# Patient Record
Sex: Female | Born: 1950
Health system: Southern US, Community
[De-identification: ages and names within clinical notes are randomized; demographics above are authoritative.]

## PROBLEM LIST (undated history)

## (undated) DIAGNOSIS — J45909 Unspecified asthma, uncomplicated: Secondary | ICD-10-CM

## (undated) DIAGNOSIS — R0600 Dyspnea, unspecified: Secondary | ICD-10-CM

## (undated) DIAGNOSIS — T8859XA Other complications of anesthesia, initial encounter: Secondary | ICD-10-CM

## (undated) DIAGNOSIS — Z803 Family history of malignant neoplasm of breast: Secondary | ICD-10-CM

## (undated) DIAGNOSIS — E039 Hypothyroidism, unspecified: Secondary | ICD-10-CM

## (undated) DIAGNOSIS — T4145XA Adverse effect of unspecified anesthetic, initial encounter: Secondary | ICD-10-CM

## (undated) DIAGNOSIS — Z9889 Other specified postprocedural states: Secondary | ICD-10-CM

## (undated) DIAGNOSIS — R112 Nausea with vomiting, unspecified: Secondary | ICD-10-CM

## (undated) DIAGNOSIS — I10 Essential (primary) hypertension: Secondary | ICD-10-CM

## (undated) HISTORY — PX: ABDOMINAL HYSTERECTOMY: SHX81

## (undated) HISTORY — DX: Family history of malignant neoplasm of breast: Z80.3

## (undated) HISTORY — PX: CHOLECYSTECTOMY: SHX55

## (undated) HISTORY — PX: DILATION AND CURETTAGE OF UTERUS: SHX78

---

## 2015-10-15 DIAGNOSIS — I10 Essential (primary) hypertension: Secondary | ICD-10-CM | POA: Diagnosis not present

## 2015-10-15 DIAGNOSIS — E039 Hypothyroidism, unspecified: Secondary | ICD-10-CM | POA: Diagnosis not present

## 2015-10-17 DIAGNOSIS — E039 Hypothyroidism, unspecified: Secondary | ICD-10-CM | POA: Diagnosis not present

## 2015-10-17 DIAGNOSIS — I10 Essential (primary) hypertension: Secondary | ICD-10-CM | POA: Diagnosis not present

## 2015-10-17 DIAGNOSIS — E789 Disorder of lipoprotein metabolism, unspecified: Secondary | ICD-10-CM | POA: Diagnosis not present

## 2015-11-15 DIAGNOSIS — I10 Essential (primary) hypertension: Secondary | ICD-10-CM | POA: Diagnosis not present

## 2015-11-15 DIAGNOSIS — G4700 Insomnia, unspecified: Secondary | ICD-10-CM | POA: Diagnosis not present

## 2015-11-15 DIAGNOSIS — E0590 Thyrotoxicosis, unspecified without thyrotoxic crisis or storm: Secondary | ICD-10-CM | POA: Diagnosis not present

## 2015-12-20 DIAGNOSIS — I10 Essential (primary) hypertension: Secondary | ICD-10-CM | POA: Diagnosis not present

## 2015-12-20 DIAGNOSIS — F419 Anxiety disorder, unspecified: Secondary | ICD-10-CM | POA: Diagnosis not present

## 2016-01-20 DIAGNOSIS — J329 Chronic sinusitis, unspecified: Secondary | ICD-10-CM | POA: Diagnosis not present

## 2016-02-05 DIAGNOSIS — I10 Essential (primary) hypertension: Secondary | ICD-10-CM | POA: Diagnosis not present

## 2016-02-05 DIAGNOSIS — R300 Dysuria: Secondary | ICD-10-CM | POA: Diagnosis not present

## 2016-02-05 DIAGNOSIS — G4700 Insomnia, unspecified: Secondary | ICD-10-CM | POA: Diagnosis not present

## 2016-02-05 DIAGNOSIS — F419 Anxiety disorder, unspecified: Secondary | ICD-10-CM | POA: Diagnosis not present

## 2016-02-05 DIAGNOSIS — E0590 Thyrotoxicosis, unspecified without thyrotoxic crisis or storm: Secondary | ICD-10-CM | POA: Diagnosis not present

## 2016-05-12 DIAGNOSIS — J019 Acute sinusitis, unspecified: Secondary | ICD-10-CM | POA: Diagnosis not present

## 2016-05-12 DIAGNOSIS — Z889 Allergy status to unspecified drugs, medicaments and biological substances status: Secondary | ICD-10-CM | POA: Diagnosis not present

## 2016-07-03 DIAGNOSIS — Z889 Allergy status to unspecified drugs, medicaments and biological substances status: Secondary | ICD-10-CM | POA: Diagnosis not present

## 2016-07-03 DIAGNOSIS — R0602 Shortness of breath: Secondary | ICD-10-CM | POA: Diagnosis not present

## 2016-07-03 DIAGNOSIS — Z8709 Personal history of other diseases of the respiratory system: Secondary | ICD-10-CM | POA: Diagnosis not present

## 2016-08-05 DIAGNOSIS — F419 Anxiety disorder, unspecified: Secondary | ICD-10-CM | POA: Diagnosis not present

## 2016-08-05 DIAGNOSIS — I10 Essential (primary) hypertension: Secondary | ICD-10-CM | POA: Diagnosis not present

## 2016-08-05 DIAGNOSIS — Z79899 Other long term (current) drug therapy: Secondary | ICD-10-CM | POA: Diagnosis not present

## 2016-08-05 DIAGNOSIS — E0590 Thyrotoxicosis, unspecified without thyrotoxic crisis or storm: Secondary | ICD-10-CM | POA: Diagnosis not present

## 2016-08-05 DIAGNOSIS — G4700 Insomnia, unspecified: Secondary | ICD-10-CM | POA: Diagnosis not present

## 2016-08-05 DIAGNOSIS — Z889 Allergy status to unspecified drugs, medicaments and biological substances status: Secondary | ICD-10-CM | POA: Diagnosis not present

## 2016-08-05 DIAGNOSIS — R109 Unspecified abdominal pain: Secondary | ICD-10-CM | POA: Diagnosis not present

## 2016-08-05 DIAGNOSIS — Z8709 Personal history of other diseases of the respiratory system: Secondary | ICD-10-CM | POA: Diagnosis not present

## 2016-08-14 DIAGNOSIS — R109 Unspecified abdominal pain: Secondary | ICD-10-CM | POA: Diagnosis not present

## 2016-08-14 DIAGNOSIS — Z9049 Acquired absence of other specified parts of digestive tract: Secondary | ICD-10-CM | POA: Diagnosis not present

## 2016-08-24 DIAGNOSIS — R109 Unspecified abdominal pain: Secondary | ICD-10-CM | POA: Diagnosis not present

## 2016-08-24 DIAGNOSIS — K5730 Diverticulosis of large intestine without perforation or abscess without bleeding: Secondary | ICD-10-CM | POA: Diagnosis not present

## 2016-08-24 DIAGNOSIS — R112 Nausea with vomiting, unspecified: Secondary | ICD-10-CM | POA: Diagnosis not present

## 2016-08-24 DIAGNOSIS — I712 Thoracic aortic aneurysm, without rupture: Secondary | ICD-10-CM | POA: Diagnosis not present

## 2017-01-07 DIAGNOSIS — R399 Unspecified symptoms and signs involving the genitourinary system: Secondary | ICD-10-CM | POA: Diagnosis not present

## 2017-01-07 DIAGNOSIS — N3289 Other specified disorders of bladder: Secondary | ICD-10-CM | POA: Diagnosis not present

## 2017-01-07 DIAGNOSIS — R301 Vesical tenesmus: Secondary | ICD-10-CM | POA: Diagnosis not present

## 2017-03-15 DIAGNOSIS — F439 Reaction to severe stress, unspecified: Secondary | ICD-10-CM | POA: Diagnosis not present

## 2017-03-15 DIAGNOSIS — I10 Essential (primary) hypertension: Secondary | ICD-10-CM | POA: Diagnosis not present

## 2017-03-15 DIAGNOSIS — Z719 Counseling, unspecified: Secondary | ICD-10-CM | POA: Diagnosis not present

## 2017-03-15 DIAGNOSIS — Z760 Encounter for issue of repeat prescription: Secondary | ICD-10-CM | POA: Diagnosis not present

## 2017-03-15 DIAGNOSIS — Z0000 Encounter for general adult medical examination without abnormal findings: Secondary | ICD-10-CM | POA: Diagnosis not present

## 2017-03-15 DIAGNOSIS — E039 Hypothyroidism, unspecified: Secondary | ICD-10-CM | POA: Diagnosis not present

## 2017-03-15 DIAGNOSIS — Z7189 Other specified counseling: Secondary | ICD-10-CM | POA: Diagnosis not present

## 2017-03-15 DIAGNOSIS — Z0189 Encounter for other specified special examinations: Secondary | ICD-10-CM | POA: Diagnosis not present

## 2017-05-03 DIAGNOSIS — I10 Essential (primary) hypertension: Secondary | ICD-10-CM | POA: Diagnosis not present

## 2017-05-03 DIAGNOSIS — J3089 Other allergic rhinitis: Secondary | ICD-10-CM | POA: Diagnosis not present

## 2017-05-03 DIAGNOSIS — I714 Abdominal aortic aneurysm, without rupture: Secondary | ICD-10-CM | POA: Diagnosis not present

## 2017-05-03 DIAGNOSIS — Z79899 Other long term (current) drug therapy: Secondary | ICD-10-CM | POA: Diagnosis not present

## 2017-05-03 DIAGNOSIS — E038 Other specified hypothyroidism: Secondary | ICD-10-CM | POA: Diagnosis not present

## 2017-05-03 DIAGNOSIS — E784 Other hyperlipidemia: Secondary | ICD-10-CM | POA: Diagnosis not present

## 2017-05-10 DIAGNOSIS — I714 Abdominal aortic aneurysm, without rupture: Secondary | ICD-10-CM | POA: Diagnosis not present

## 2017-05-12 DIAGNOSIS — N6031 Fibrosclerosis of right breast: Secondary | ICD-10-CM | POA: Diagnosis not present

## 2017-05-12 DIAGNOSIS — N6310 Unspecified lump in the right breast, unspecified quadrant: Secondary | ICD-10-CM | POA: Diagnosis not present

## 2017-05-12 DIAGNOSIS — N6311 Unspecified lump in the right breast, upper outer quadrant: Secondary | ICD-10-CM | POA: Diagnosis not present

## 2017-05-12 DIAGNOSIS — N6489 Other specified disorders of breast: Secondary | ICD-10-CM | POA: Diagnosis not present

## 2017-06-16 ENCOUNTER — Ambulatory Visit: Payer: Self-pay | Admitting: Physician Assistant

## 2017-08-09 DIAGNOSIS — E7849 Other hyperlipidemia: Secondary | ICD-10-CM | POA: Diagnosis not present

## 2017-08-09 DIAGNOSIS — R195 Other fecal abnormalities: Secondary | ICD-10-CM | POA: Diagnosis not present

## 2017-08-09 DIAGNOSIS — E038 Other specified hypothyroidism: Secondary | ICD-10-CM | POA: Diagnosis not present

## 2017-08-09 DIAGNOSIS — I10 Essential (primary) hypertension: Secondary | ICD-10-CM | POA: Diagnosis not present

## 2017-09-15 DIAGNOSIS — J029 Acute pharyngitis, unspecified: Secondary | ICD-10-CM | POA: Diagnosis not present

## 2017-09-15 DIAGNOSIS — H6641 Suppurative otitis media, unspecified, right ear: Secondary | ICD-10-CM | POA: Diagnosis not present

## 2017-11-07 DIAGNOSIS — I10 Essential (primary) hypertension: Secondary | ICD-10-CM | POA: Diagnosis not present

## 2017-11-07 DIAGNOSIS — R05 Cough: Secondary | ICD-10-CM | POA: Diagnosis not present

## 2017-11-07 DIAGNOSIS — J0110 Acute frontal sinusitis, unspecified: Secondary | ICD-10-CM | POA: Diagnosis not present

## 2017-11-15 DIAGNOSIS — R195 Other fecal abnormalities: Secondary | ICD-10-CM | POA: Diagnosis not present

## 2017-11-15 DIAGNOSIS — E038 Other specified hypothyroidism: Secondary | ICD-10-CM | POA: Diagnosis not present

## 2017-11-15 DIAGNOSIS — I10 Essential (primary) hypertension: Secondary | ICD-10-CM | POA: Diagnosis not present

## 2017-11-15 DIAGNOSIS — E7849 Other hyperlipidemia: Secondary | ICD-10-CM | POA: Diagnosis not present

## 2017-12-01 DIAGNOSIS — K5289 Other specified noninfective gastroenteritis and colitis: Secondary | ICD-10-CM | POA: Diagnosis not present

## 2017-12-01 DIAGNOSIS — I10 Essential (primary) hypertension: Secondary | ICD-10-CM | POA: Diagnosis not present

## 2017-12-22 DIAGNOSIS — R1012 Left upper quadrant pain: Secondary | ICD-10-CM | POA: Diagnosis not present

## 2018-01-06 DIAGNOSIS — I10 Essential (primary) hypertension: Secondary | ICD-10-CM | POA: Diagnosis not present

## 2018-01-06 DIAGNOSIS — K5289 Other specified noninfective gastroenteritis and colitis: Secondary | ICD-10-CM | POA: Diagnosis not present

## 2018-01-11 ENCOUNTER — Other Ambulatory Visit (INDEPENDENT_AMBULATORY_CARE_PROVIDER_SITE_OTHER): Payer: Self-pay | Admitting: *Deleted

## 2018-01-11 ENCOUNTER — Encounter (INDEPENDENT_AMBULATORY_CARE_PROVIDER_SITE_OTHER): Payer: Self-pay | Admitting: Internal Medicine

## 2018-01-11 ENCOUNTER — Encounter (INDEPENDENT_AMBULATORY_CARE_PROVIDER_SITE_OTHER): Payer: Self-pay | Admitting: *Deleted

## 2018-01-11 ENCOUNTER — Telehealth (INDEPENDENT_AMBULATORY_CARE_PROVIDER_SITE_OTHER): Payer: Self-pay | Admitting: *Deleted

## 2018-01-11 ENCOUNTER — Ambulatory Visit (INDEPENDENT_AMBULATORY_CARE_PROVIDER_SITE_OTHER): Payer: Medicare Other | Admitting: Internal Medicine

## 2018-01-11 VITALS — BP 130/94 | HR 68 | Temp 98.5°F | Resp 18 | Ht 65.0 in | Wt 190.9 lb

## 2018-01-11 DIAGNOSIS — K625 Hemorrhage of anus and rectum: Secondary | ICD-10-CM | POA: Insufficient documentation

## 2018-01-11 DIAGNOSIS — R109 Unspecified abdominal pain: Secondary | ICD-10-CM | POA: Insufficient documentation

## 2018-01-11 DIAGNOSIS — R112 Nausea with vomiting, unspecified: Secondary | ICD-10-CM

## 2018-01-11 MED ORDER — DICYCLOMINE HCL 10 MG PO CAPS: 10.0000 mg | ORAL_CAPSULE | Freq: Three times a day (TID) | ORAL | 0 refills | Status: DC

## 2018-01-11 MED ORDER — PEG 3350-KCL-NA BICARB-NACL 420 G PO SOLR: 4000.0000 mL | Freq: Once | ORAL | 0 refills | Status: AC

## 2018-01-11 NOTE — Patient Instructions (Addendum)
Esophagogastroduodenoscopy and colonoscopy to be scheduled. Keep daily symptom diary until the time of the procedures.

## 2018-01-11 NOTE — Patient Instructions (Signed)
Denise [name]  01/11/2018     @PREFPERIOPPHARMACY @   Your procedure is scheduled on  01/17/2018 .  Report to [facility] at  615   A.M.  Call this number if you have problems the morning of surgery:  [phone]   Remember:  Do not eat food or drink liquids after midnight.  Take these medicines the morning of surgery with A SIP OF WATER  Levothyroxine, lisinopril, metoprolol.   Do not wear jewelry, make-up or nail polish.  Do not wear lotions, powders, or perfumes, or deodorant.  Do not shave 48 hours prior to surgery.  Men may shave face and neck.  Do not bring valuables to the hospital.  [facility] is not responsible for any belongings or valuables.  Contacts, dentures or bridgework may not be worn into surgery.  Leave your suitcase in the car.  After surgery it may be brought to your room.  For patients admitted to the hospital, discharge time will be determined by your treatment team.  Patients discharged the day of surgery will not be allowed to drive home.   Name and phone number of your driver:   family Special instructions:  Follow the diet and prep instructions given to you by Dr Olevia Perches office.  Please read over the following fact sheets that you were given. Anesthesia Post-op Instructions and Care and Recovery After Surgery       Esophagogastroduodenoscopy Esophagogastroduodenoscopy (EGD) is a procedure to examine the lining of the esophagus, stomach, and first part of the small intestine (duodenum). This procedure is done to check for problems such as inflammation, bleeding, ulcers, or growths. During this procedure, a long, flexible, lighted tube with a camera attached (endoscope) is inserted down the throat. Tell a health care provider about:  Any allergies you have.  All medicines you are taking, including vitamins, herbs, eye drops, creams, and over-the-counter medicines.  Any problems you or family members have had with  anesthetic medicines.  Any blood disorders you have.  Any surgeries you have had.  Any medical conditions you have.  Whether you are pregnant or may be pregnant. What are the risks? Generally, this is a safe procedure. However, problems may occur, including:  Infection.  Bleeding.  A tear (perforation) in the esophagus, stomach, or duodenum.  Trouble breathing.  Excessive sweating.  Spasms of the larynx.  A slowed heartbeat.  Low blood pressure.  What happens before the procedure?  Follow instructions from your health care provider about eating or drinking restrictions.  Ask your health care provider about: ? Changing or stopping your regular medicines. This is especially important if you are taking diabetes medicines or blood thinners. ? Taking medicines such as aspirin and ibuprofen. These medicines can thin your blood. Do not take these medicines before your procedure if your health care provider instructs you not to.  Plan to have someone take you home after the procedure.  If you wear dentures, be ready to remove them before the procedure. What happens during the procedure?  To reduce your risk of infection, your health care team will wash or sanitize their hands.  An IV tube will be put in a vein in your hand or arm. You will get medicines and fluids through this tube.  You will be given one or more of the following: ? A medicine to help you relax (sedative). ? A medicine to numb the area (local anesthetic).  This medicine may be sprayed into your throat. It will make you feel more comfortable and keep you from gagging or coughing during the procedure. ? A medicine for pain.  A mouth guard may be placed in your mouth to protect your teeth and to keep you from biting on the endoscope.  You will be asked to lie on your left side.  The endoscope will be lowered down your throat into your esophagus, stomach, and duodenum.  Air will be put into the endoscope.  This will help your health care provider see better.  The lining of your esophagus, stomach, and duodenum will be examined.  Your health care provider may: ? Take a tissue sample so it can be looked at in a lab (biopsy). ? Remove growths. ? Remove objects (foreign bodies) that are stuck. ? Treat any bleeding with medicines or other devices that stop tissue from bleeding. ? Widen (dilate) or stretch narrowed areas of your esophagus and stomach.  The endoscope will be taken out. The procedure may vary among health care providers and hospitals. What happens after the procedure?  Your blood pressure, heart rate, breathing rate, and blood oxygen level will be monitored often until the medicines you were given have worn off.  Do not eat or drink anything until the numbing medicine has worn off and your gag reflex has returned. This information is not intended to replace advice given to you by your health care provider. Make sure you discuss any questions you have with your health care provider. Document Released: 01/01/2005 Document Revised: 02/06/2016 Document Reviewed: 07/25/2015 Elsevier Interactive Patient Education  2018 [employer]. Esophagogastroduodenoscopy, Care After Refer to this sheet in the next few weeks. These instructions provide you with information about caring for yourself after your procedure. Your health care provider may also give you more specific instructions. Your treatment has been planned according to current medical practices, but problems sometimes occur. Call your health care provider if you have any problems or questions after your procedure. What can I expect after the procedure? After the procedure, it is common to have:  A sore throat.  Nausea.  Bloating.  Dizziness.  Fatigue.  Follow these instructions at home:  Do not eat or drink anything until the numbing medicine (local anesthetic) has worn off and your gag reflex has returned. You will know  that the local anesthetic has worn off when you can swallow comfortably.  Do not drive for 24 hours if you received a medicine to help you relax (sedative).  If your health care provider took a tissue sample for testing during the procedure, make sure to get your test results. This is your responsibility. Ask your health care provider or the department performing the test when your results will be ready.  Keep all follow-up visits as told by your health care provider. This is important. Contact a health care provider if:  You cannot stop coughing.  You are not urinating.  You are urinating less than usual. Get help right away if:  You have trouble swallowing.  You cannot eat or drink.  You have throat or chest pain that gets worse.  You are dizzy or light-headed.  You faint.  You have nausea or vomiting.  You have chills.  You have a fever.  You have severe abdominal pain.  You have black, tarry, or bloody stools. This information is not intended to replace advice given to you by your health care provider. Make sure you discuss any questions  you have with your health care provider. Document Released: 08/17/2012 Document Revised: 02/06/2016 Document Reviewed: 07/25/2015 Elsevier Interactive Patient Education  2018 [employer].  Colonoscopy, Adult A colonoscopy is an exam to look at the large intestine. It is done to check for problems, such as:  Lumps (tumors).  Growths (polyps).  Swelling (inflammation).  Bleeding.  What happens before the procedure? Eating and drinking Follow instructions from your doctor about eating and drinking. These instructions may include:  A few days before the procedure - follow a low-fiber diet. ? Avoid nuts. ? Avoid seeds. ? Avoid dried fruit. ? Avoid raw fruits. ? Avoid vegetables.  1-3 days before the procedure - follow a clear liquid diet. Avoid liquids that have red or purple dye. Drink only clear liquids, such  as: ? Clear broth or bouillon. ? Black coffee or tea. ? Clear juice. ? Clear soft drinks or sports drinks. ? Gelatin dessert. ? Popsicles.  On the day of the procedure - do not eat or drink anything during the 2 hours before the procedure.  Bowel prep If you were prescribed an oral bowel prep:  Take it as told by your doctor. Starting the day before your procedure, you will need to drink a lot of liquid. The liquid will cause you to poop (have bowel movements) until your poop is almost clear or light green.  If your skin or butt gets irritated from diarrhea, you may: ? Wipe the area with wipes that have medicine in them, such as adult wet wipes with aloe and vitamin E. ? Put something on your skin that soothes the area, such as petroleum jelly.  If you throw up (vomit) while drinking the bowel prep, take a break for up to 60 minutes. Then begin the bowel prep again. If you keep throwing up and you cannot take the bowel prep without throwing up, call your doctor.  General instructions  Ask your doctor about changing or stopping your normal medicines. This is important if you take diabetes medicines or blood thinners.  Plan to have someone take you home from the hospital or clinic. What happens during the procedure?  An IV tube may be put into one of your veins.  You will be given medicine to help you relax (sedative).  To reduce your risk of infection: ? Your doctors will wash their hands. ? Your anal area will be washed with soap.  You will be asked to lie on your side with your knees bent.  Your doctor will get a long, thin, flexible tube ready. The tube will have a camera and a light on the end.  The tube will be put into your anus.  The tube will be gently put into your large intestine.  Air will be delivered into your large intestine to keep it open. You may feel some pressure or cramping.  The camera will be used to take photos.  A small tissue sample may be  removed from your body to be looked at under a microscope (biopsy). If any possible problems are found, the tissue will be sent to a lab for testing.  If small growths are found, your doctor may remove them and have them checked for cancer.  The tube that was put into your anus will be slowly removed. The procedure may vary among doctors and hospitals. What happens after the procedure?  Your doctor will check on you often until the medicines you were given have worn off.  Do not drive  for 24 hours after the procedure.  You may have a small amount of blood in your poop.  You may pass gas.  You may have mild cramps or bloating in your belly (abdomen).  It is up to you to get the results of your procedure. Ask your doctor, or the department performing the procedure, when your results will be ready. This information is not intended to replace advice given to you by your health care provider. Make sure you discuss any questions you have with your health care provider. Document Released: 10/03/2010 Document Revised: 07/01/2016 Document Reviewed: 11/12/2015 Elsevier Interactive Patient Education  2017 [employer]  Colonoscopy, Adult, Care After This sheet gives you information about how to care for yourself after your procedure. Your health care provider may also give you more specific instructions. If you have problems or questions, contact your health care provider. What can I expect after the procedure? After the procedure, it is common to have:  A small amount of blood in your stool for 24 hours after the procedure.  Some gas.  Mild abdominal cramping or bloating.  Follow these instructions at home: General instructions   For the first 24 hours after the procedure: ? Do not drive or use machinery. ? Do not sign important documents. ? Do not drink alcohol. ? Do your regular daily activities at a slower pace than normal. ? Eat soft, easy-to-digest foods. ? Rest  often.  Take over-the-counter or prescription medicines only as told by your health care provider.  It is up to you to get the results of your procedure. Ask your health care provider, or the department performing the procedure, when your results will be ready. Relieving cramping and bloating  Try walking around when you have cramps or feel bloated.  Apply heat to your abdomen as told by your health care provider. Use a heat source that your health care provider recommends, such as a moist heat pack or a heating pad. ? Place a towel between your skin and the heat source. ? Leave the heat on for 20-30 minutes. ? Remove the heat if your skin turns bright red. This is especially important if you are unable to feel pain, heat, or cold. You may have a greater risk of getting burned. Eating and drinking  Drink enough fluid to keep your urine clear or pale yellow.  Resume your normal diet as instructed by your health care provider. Avoid heavy or fried foods that are hard to digest.  Avoid drinking alcohol for as long as instructed by your health care provider. Contact a health care provider if:  You have blood in your stool 2-3 days after the procedure. Get help right away if:  You have more than a small spotting of blood in your stool.  You pass large blood clots in your stool.  Your abdomen is swollen.  You have nausea or vomiting.  You have a fever.  You have increasing abdominal pain that is not relieved with medicine. This information is not intended to replace advice given to you by your health care provider. Make sure you discuss any questions you have with your health care provider. Document Released: 04/14/2004 Document Revised: 05/25/2016 Document Reviewed: 11/12/2015 Elsevier Interactive Patient Education  2018 [employer] Anesthesia is a term that refers to techniques, procedures, and medicines that help a person stay safe and comfortable  during a medical procedure. Monitored anesthesia care, or sedation, is one type of anesthesia. Your anesthesia  specialist may recommend sedation if you will be having a procedure that does not require you to be unconscious, such as:  Cataract surgery.  A dental procedure.  A biopsy.  A colonoscopy.  During the procedure, you may receive a medicine to help you relax (sedative). There are three levels of sedation:  Mild sedation. At this level, you may feel awake and relaxed. You will be able to follow directions.  Moderate sedation. At this level, you will be sleepy. You may not remember the procedure.  Deep sedation. At this level, you will be asleep. You will not remember the procedure.  The more medicine you are given, the deeper your level of sedation will be. Depending on how you respond to the procedure, the anesthesia specialist may change your level of sedation or the type of anesthesia to fit your needs. An anesthesia specialist will monitor you closely during the procedure. Let your health care provider know about:  Any allergies you have.  All medicines you are taking, including vitamins, herbs, eye drops, creams, and over-the-counter medicines.  Any use of steroids (by mouth or as a cream).  Any problems you or family members have had with sedatives and anesthetic medicines.  Any blood disorders you have.  Any surgeries you have had.  Any medical conditions you have, such as sleep apnea.  Whether you are pregnant or may be pregnant.  Any use of cigarettes, alcohol, or street drugs. What are the risks? Generally, this is a safe procedure. However, problems may occur, including:  Getting too much medicine (oversedation).  Nausea.  Allergic reaction to medicines.  Trouble breathing. If this happens, a breathing tube may be used to help with breathing. It will be removed when you are awake and breathing on your own.  Heart trouble.  Lung trouble.  Before  the procedure Staying hydrated Follow instructions from your health care provider about hydration, which may include:  Up to 2 hours before the procedure - you may continue to drink clear liquids, such as water, clear fruit juice, black coffee, and plain tea.  Eating and drinking restrictions Follow instructions from your health care provider about eating and drinking, which may include:  8 hours before the procedure - stop eating heavy meals or foods such as meat, fried foods, or fatty foods.  6 hours before the procedure - stop eating light meals or foods, such as toast or cereal.  6 hours before the procedure - stop drinking milk or drinks that contain milk.  2 hours before the procedure - stop drinking clear liquids.  Medicines Ask your health care provider about:  Changing or stopping your regular medicines. This is especially important if you are taking diabetes medicines or blood thinners.  Taking medicines such as aspirin and ibuprofen. These medicines can thin your blood. Do not take these medicines before your procedure if your health care provider instructs you not to.  Tests and exams  You will have a physical exam.  You may have blood tests done to show: ? How well your kidneys and liver are working. ? How well your blood can clot.  General instructions  Plan to have someone take you home from the hospital or clinic.  If you will be going home right after the procedure, plan to have someone with you for 24 hours.  What happens during the procedure?  Your blood pressure, heart rate, breathing, level of pain and overall condition will be monitored.  An IV tube will be  inserted into one of your veins.  Your anesthesia specialist will give you medicines as needed to keep you comfortable during the procedure. This may mean changing the level of sedation.  The procedure will be performed. After the procedure  Your blood pressure, heart rate, breathing rate, and  blood oxygen level will be monitored until the medicines you were given have worn off.  Do not drive for 24 hours if you received a sedative.  You may: ? Feel sleepy, clumsy, or nauseous. ? Feel forgetful about what happened after the procedure. ? Have a sore throat if you had a breathing tube during the procedure. ? Vomit. This information is not intended to replace advice given to you by your health care provider. Make sure you discuss any questions you have with your health care provider. Document Released: 05/27/2005 Document Revised: 02/07/2016 Document Reviewed: 12/22/2015 Elsevier Interactive Patient Education  2018 [place], Care After These instructions provide you with information about caring for yourself after your procedure. Your health care provider may also give you more specific instructions. Your treatment has been planned according to current medical practices, but problems sometimes occur. Call your health care provider if you have any problems or questions after your procedure. What can I expect after the procedure? After your procedure, it is common to:  Feel sleepy for several hours.  Feel clumsy and have poor balance for several hours.  Feel forgetful about what happened after the procedure.  Have poor judgment for several hours.  Feel nauseous or vomit.  Have a sore throat if you had a breathing tube during the procedure.  Follow these instructions at home: For at least 24 hours after the procedure:   Do not: ? Participate in activities in which you could fall or become injured. ? Drive. ? Use heavy machinery. ? Drink alcohol. ? Take sleeping pills or medicines that cause drowsiness. ? Make important decisions or sign legal documents. ? Take care of children on your own.  Rest. Eating and drinking  Follow the diet that is recommended by your health care provider.  If you vomit, drink water, juice, or soup when you  can drink without vomiting.  Make sure you have little or no nausea before eating solid foods. General instructions  Have a responsible adult stay with you until you are awake and alert.  Take over-the-counter and prescription medicines only as told by your health care provider.  If you smoke, do not smoke without supervision.  Keep all follow-up visits as told by your health care provider. This is important. Contact a health care provider if:  You keep feeling nauseous or you keep vomiting.  You feel light-headed.  You develop a rash.  You have a fever. Get help right away if:  You have trouble breathing. This information is not intended to replace advice given to you by your health care provider. Make sure you discuss any questions you have with your health care provider. Document Released: 12/22/2015 Document Revised: 04/22/2016 Document Reviewed: 12/22/2015 Elsevier Interactive Patient Education  [employer].

## 2018-01-11 NOTE — H&P (View-Only) (Signed)
Reason for consultation:  Nausea vomiting abdominal pain diarrhea and rectal bleeding.  History of present illness:  Patient is 67 year old Caucasian female who was referred through courtesy of Dr. Sherrie Sport for GI evaluation. Patient presents with over 23-month history of intermittent episodes of left-sided abdominal pain associated with diarrhea nausea vomiting and rectal bleeding.  During these episodes she has profuse sweating and she may have to sit on a commode for 1 to 4 hours.  Her stool is initially loose watery and then she starts to pass bright red blood per rectum.  When she has these spells she has squishing abdominal ultrasound throbbing and cramping abdominal pain.  She also noticed generalized skin flushing but she does not have breathing difficulty or wheezing.  These episodes have occurred at different times.  She has had these episodes her work.  These episodes are infrequent but lately she is having one episode a week.  Last episode was 6 days ago.  Last episode occurred while she was at work.  She was advised by her boss to find out the source of her symptoms before she return to work. In between the spells she feels fine and has normal bowel movements.  She had colonoscopy 5 or 6 years ago for screening purposes and was normal.  She had this exam while she was living in [place]. She has good appetite.  She has not lost any weight.  She has occasional heartburn with certain foods.  She denies dysphagia.  She also denies dysuria or hematuria.    Current Medications: Outpatient Encounter Medications as of 01/11/2018  Medication Sig  . Acetaminophen (TYLENOL PO) Take by mouth as needed.  Marland Kitchen amLODipine (NORVASC) 5 MG tablet Take 5 mg by mouth daily. Late afternoon  . aspirin-acetaminophen-caffeine (EXCEDRIN MIGRAINE) 250-250-65 MG tablet Take by mouth as needed for headache.  . levothyroxine (SYNTHROID, LEVOTHROID) 100 MCG tablet Take 100 mcg by mouth daily before breakfast.  .  lisinopril (PRINIVIL,ZESTRIL) 40 MG tablet Take 40 mg by mouth daily.  . Melatonin 5 MG TABS Take 5 mg by mouth. As needed for sleep  . metoprolol succinate (TOPROL-XL) 25 MG 24 hr tablet Take 25 mg by mouth at bedtime.   No facility-administered encounter medications on file as of 01/11/2018.    Past medical history:  Hypertension. Migraine. Hypothyroidism diagnosed over 30 years ago. Cholecystectomy over 30 years ago. Endometriosis.  Status post hysterectomy with removal of one ovary 20 years ago.  Other ovary could not be found.   Allergies:  Allergies  Allergen Reactions  . Exforge [Amlodipine Besylate-Valsartan] Nausea And Vomiting    Family history:  Mother was also hypothyroid and she lived to be 11.  She was also treated for breast carcinoma and had undergone CABG at age 71. Father had COPD and died at age 26. She lost 1 brother at age 59 due to auto accident. She has a brother age 38 who is in good health and he is a Research officer, trade union.  Social history:  She is widowed.  She lost her husband at age 68 because of posttransfusion HIV. She has 1 son and 2 daughters.  Her son is in good health and both daughters have hypothyroidism. She has been working as a Education officer, museum for 40 years.  She is presently working with [employer]. She is very active and has large flower and vegetable garden.  She does not smoke cigarettes or drink alcohol.   Physical examination: Blood pressure (!) 130/94, pulse 68, temperature 98.5  F (36.9 C), temperature source Oral, resp. rate 18, height 5\' 5"  (1.651 m), weight 190 lb 14.4 oz (86.6 kg). Patient is alert and in no acute distress. Conjunctiva is pink. Sclera is nonicteric Oropharyngeal mucosa is normal. She has complete upper and partial lower plate/bridge. No neck masses or thyromegaly noted. Cardiac exam with regular rhythm normal S1 and S2. No murmur or gallop noted. Lungs are clear to auscultation. Abdomen is full.  Bowel sounds are  normal.  No bruit noted.  On palpation abdomen is soft and nontender without organomegaly or masses. No LE edema or clubbing noted.  Labs/studies Results:  Metabolic 7 from 0/05/8118 Serum calcium 9.2 Glucose 88. BUN 14 and creatinine 0.68. Serum sodium 140, potassium 4.6, chloride 103, CO2 22.  Assessment:  Patient is pleasant 67 year old Caucasian female who presents with 105-month history of episodic left-sided abdominal pain associated with nausea vomiting diarrhea eventually leading to rectal bleeding.  These episodes are associated with profuse diaphoreses weakness and flushing of her skin.  She has not experienced bronchospasm.  In between the spells she feels fine.  While structural abnormality needs to be ruled out first but her symptoms suggestive of carcinoid syndrome or metabolic disorders.  She could also have ischemic colitis with recurrence every week make this diagnosis unlikely.   Recommendations:  Dicyclomine 10 mg by mouth 30 minutes before each meal. CBC with differential and LFTs. Diagnostic esophagogastroduodenoscopy followed by colonoscopy under monitored anesthesia care in near future. If endoscopic evaluation is unremarkable will proceed with abdominal pelvic CT and proceed with work-up for carcinoid syndrome.

## 2018-01-11 NOTE — Telephone Encounter (Signed)
Patient needs trilyte 

## 2018-01-11 NOTE — Progress Notes (Signed)
Reason for consultation:  Nausea vomiting abdominal pain diarrhea and rectal bleeding.  History of present illness:  Patient is 67 year old Caucasian female who was referred through courtesy of Dr. Sherrie Sport for GI evaluation. Patient presents with over 103-month history of intermittent episodes of left-sided abdominal pain associated with diarrhea nausea vomiting and rectal bleeding.  During these episodes she has profuse sweating and she may have to sit on a commode for 1 to 4 hours.  Her stool is initially loose watery and then she starts to pass bright red blood per rectum.  When she has these spells she has squishing abdominal ultrasound throbbing and cramping abdominal pain.  She also noticed generalized skin flushing but she does not have breathing difficulty or wheezing.  These episodes have occurred at different times.  She has had these episodes her work.  These episodes are infrequent but lately she is having one episode a week.  Last episode was 6 days ago.  Last episode occurred while she was at work.  She was advised by her boss to find out the source of her symptoms before she return to work. In between the spells she feels fine and has normal bowel movements.  She had colonoscopy 5 or 6 years ago for screening purposes and was normal.  She had this exam while she was living in [place]. She has good appetite.  She has not lost any weight.  She has occasional heartburn with certain foods.  She denies dysphagia.  She also denies dysuria or hematuria.    Current Medications: Outpatient Encounter Medications as of 01/11/2018  Medication Sig  . Acetaminophen (TYLENOL PO) Take by mouth as needed.  Marland Kitchen amLODipine (NORVASC) 5 MG tablet Take 5 mg by mouth daily. Late afternoon  . aspirin-acetaminophen-caffeine (EXCEDRIN MIGRAINE) 250-250-65 MG tablet Take by mouth as needed for headache.  . levothyroxine (SYNTHROID, LEVOTHROID) 100 MCG tablet Take 100 mcg by mouth daily before breakfast.  .  lisinopril (PRINIVIL,ZESTRIL) 40 MG tablet Take 40 mg by mouth daily.  . Melatonin 5 MG TABS Take 5 mg by mouth. As needed for sleep  . metoprolol succinate (TOPROL-XL) 25 MG 24 hr tablet Take 25 mg by mouth at bedtime.   No facility-administered encounter medications on file as of 01/11/2018.    Past medical history:  Hypertension. Migraine. Hypothyroidism diagnosed over 30 years ago. Cholecystectomy over 30 years ago. Endometriosis.  Status post hysterectomy with removal of one ovary 20 years ago.  Other ovary could not be found.   Allergies:  Allergies  Allergen Reactions  . Exforge [Amlodipine Besylate-Valsartan] Nausea And Vomiting    Family history:  Mother was also hypothyroid and she lived to be 69.  She was also treated for breast carcinoma and had undergone CABG at age 28. Father had COPD and died at age 73. She lost 1 brother at age 77 due to auto accident. She has a brother age 66 who is in good health and he is a Research officer, trade union.  Social history:  She is widowed.  She lost her husband at age 3 because of posttransfusion HIV. She has 1 son and 2 daughters.  Her son is in good health and both daughters have hypothyroidism. 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 She is very active and has large flower and vegetable garden.  She does not smoke cigarettes or drink alcohol.   Physical examination: Blood pressure (!) 130/94, pulse 68, temperature 98.5  F (36.9 C), temperature source Oral, resp. rate 18, height 5\' 5"  (1.651 m), weight 190 lb 14.4 oz (86.6 kg). Patient is alert and in no acute distress. Conjunctiva is pink. Sclera is nonicteric Oropharyngeal mucosa is normal. She has complete upper and partial lower plate/bridge. No neck masses or thyromegaly noted. Cardiac exam with regular rhythm normal S1 and S2. No murmur or gallop noted. Lungs are clear to auscultation. Abdomen is full.  Bowel sounds are  normal.  No bruit noted.  On palpation abdomen is soft and nontender without organomegaly or masses. No LE edema or clubbing noted.  Labs/studies Results:  Metabolic 7 from 05/17/7901 Serum calcium 9.2 Glucose 88. BUN 14 and creatinine 0.68. Serum sodium 140, potassium 4.6, chloride 103, CO2 22.  Assessment:  Patient is pleasant 67 year old Caucasian female who presents with 69-month history of episodic left-sided abdominal pain associated with nausea vomiting diarrhea eventually leading to rectal bleeding.  These episodes are associated with profuse diaphoreses weakness and flushing of her skin.  She has not experienced bronchospasm.  In between the spells she feels fine.  While structural abnormality needs to be ruled out first but her symptoms suggestive of carcinoid syndrome or metabolic disorders.  She could also have ischemic colitis with recurrence every week make this diagnosis unlikely.   Recommendations:  Dicyclomine 10 mg by mouth 30 minutes before each meal. CBC with differential and LFTs. Diagnostic esophagogastroduodenoscopy followed by colonoscopy under monitored anesthesia care in near future. If endoscopic evaluation is unremarkable will proceed with abdominal pelvic CT and proceed with work-up for carcinoid syndrome.

## 2018-01-13 ENCOUNTER — Encounter (HOSPITAL_COMMUNITY): Payer: Self-pay

## 2018-01-13 ENCOUNTER — Other Ambulatory Visit: Payer: Self-pay

## 2018-01-13 ENCOUNTER — Encounter (HOSPITAL_COMMUNITY)
Admission: RE | Admit: 2018-01-13 | Discharge: 2018-01-13 | Disposition: A | Discharge status: Home / Self Care | Payer: Medicare Other | Source: Ambulatory Visit | Attending: Internal Medicine | Admitting: Internal Medicine

## 2018-01-13 DIAGNOSIS — Z01812 Encounter for preprocedural laboratory examination: Secondary | ICD-10-CM | POA: Insufficient documentation

## 2018-01-13 DIAGNOSIS — K625 Hemorrhage of anus and rectum: Secondary | ICD-10-CM | POA: Insufficient documentation

## 2018-01-13 DIAGNOSIS — R112 Nausea with vomiting, unspecified: Secondary | ICD-10-CM | POA: Insufficient documentation

## 2018-01-13 DIAGNOSIS — R109 Unspecified abdominal pain: Secondary | ICD-10-CM | POA: Diagnosis not present

## 2018-01-13 DIAGNOSIS — Z01810 Encounter for preprocedural cardiovascular examination: Secondary | ICD-10-CM | POA: Insufficient documentation

## 2018-01-13 HISTORY — DX: Hypothyroidism, unspecified: E03.9

## 2018-01-13 HISTORY — DX: Essential (primary) hypertension: I10

## 2018-01-13 HISTORY — DX: Other complications of anesthesia, initial encounter: T88.59XA

## 2018-01-13 HISTORY — DX: Adverse effect of unspecified anesthetic, initial encounter: T41.45XA

## 2018-01-13 HISTORY — DX: Unspecified asthma, uncomplicated: J45.909

## 2018-01-13 LAB — COMPREHENSIVE METABOLIC PANEL
ALBUMIN: 4.1 g/dL (ref 3.5–5.0)
ALK PHOS: 71 U/L (ref 38–126)
ALT: 14 U/L (ref 14–54)
ANION GAP: 10 (ref 5–15)
AST: 17 U/L (ref 15–41)
BILIRUBIN TOTAL: 0.5 mg/dL (ref 0.3–1.2)
BUN: 20 mg/dL (ref 6–20)
CALCIUM: 9.1 mg/dL (ref 8.9–10.3)
CO2: 23 mmol/L (ref 22–32)
Chloride: 105 mmol/L (ref 101–111)
Creatinine, Ser: 0.84 mg/dL (ref 0.44–1.00)
GFR calc Af Amer: >60 mL/min (ref >60)
Glucose, Bld: 97 mg/dL (ref 65–99)
POTASSIUM: 3.9 mmol/L (ref 3.5–5.1)
Sodium: 138 mmol/L (ref 135–145)
TOTAL PROTEIN: 7.6 g/dL (ref 6.5–8.1)

## 2018-01-13 LAB — CBC WITH DIFFERENTIAL/PLATELET
Basophils Absolute: 0 10*3/uL (ref 0.0–0.1)
Basophils Relative: 0 %
Eosinophils Absolute: 0.1 10*3/uL (ref 0.0–0.7)
Eosinophils Relative: 3 %
HCT: 40.4 % (ref 36.0–46.0)
HEMOGLOBIN: 13.1 g/dL (ref 12.0–15.0)
LYMPHS ABS: 2.2 10*3/uL (ref 0.7–4.0)
LYMPHS PCT: 40 %
MCH: 31 pg (ref 26.0–34.0)
MCHC: 32.4 g/dL (ref 30.0–36.0)
MCV: 95.7 fL (ref 78.0–100.0)
MONO ABS: 0.6 10*3/uL (ref 0.1–1.0)
Monocytes Relative: 11 %
NEUTROS PCT: 46 %
Neutro Abs: 2.6 10*3/uL (ref 1.7–7.7)
Platelets: 313 10*3/uL (ref 150–400)
RBC: 4.22 MIL/uL (ref 3.87–5.11)
RDW: 12.6 % (ref 11.5–15.5)
WBC: 5.5 10*3/uL (ref 4.0–10.5)

## 2018-01-17 ENCOUNTER — Ambulatory Visit (HOSPITAL_COMMUNITY)
Admission: RE | Admit: 2018-01-17 | Discharge: 2018-01-17 | Disposition: A | Discharge status: Home / Self Care | Payer: Medicare Other | Source: Ambulatory Visit | Attending: Internal Medicine | Admitting: Internal Medicine

## 2018-01-17 ENCOUNTER — Encounter (HOSPITAL_COMMUNITY): Payer: Self-pay

## 2018-01-17 ENCOUNTER — Ambulatory Visit (HOSPITAL_COMMUNITY): Payer: Medicare Other | Admitting: Anesthesiology

## 2018-01-17 ENCOUNTER — Encounter (HOSPITAL_COMMUNITY)
Admission: RE | Disposition: A | Discharge status: Home / Self Care | Payer: Self-pay | Source: Ambulatory Visit | Attending: Internal Medicine

## 2018-01-17 DIAGNOSIS — Z79899 Other long term (current) drug therapy: Secondary | ICD-10-CM | POA: Diagnosis not present

## 2018-01-17 DIAGNOSIS — Z90710 Acquired absence of both cervix and uterus: Secondary | ICD-10-CM | POA: Insufficient documentation

## 2018-01-17 DIAGNOSIS — K625 Hemorrhage of anus and rectum: Secondary | ICD-10-CM | POA: Diagnosis not present

## 2018-01-17 DIAGNOSIS — K2950 Unspecified chronic gastritis without bleeding: Secondary | ICD-10-CM | POA: Diagnosis not present

## 2018-01-17 DIAGNOSIS — E039 Hypothyroidism, unspecified: Secondary | ICD-10-CM | POA: Diagnosis not present

## 2018-01-17 DIAGNOSIS — D120 Benign neoplasm of cecum: Secondary | ICD-10-CM | POA: Diagnosis not present

## 2018-01-17 DIAGNOSIS — R112 Nausea with vomiting, unspecified: Secondary | ICD-10-CM

## 2018-01-17 DIAGNOSIS — K3189 Other diseases of stomach and duodenum: Secondary | ICD-10-CM | POA: Diagnosis not present

## 2018-01-17 DIAGNOSIS — K449 Diaphragmatic hernia without obstruction or gangrene: Secondary | ICD-10-CM | POA: Insufficient documentation

## 2018-01-17 DIAGNOSIS — K210 Gastro-esophageal reflux disease with esophagitis: Secondary | ICD-10-CM | POA: Diagnosis not present

## 2018-01-17 DIAGNOSIS — R197 Diarrhea, unspecified: Secondary | ICD-10-CM | POA: Insufficient documentation

## 2018-01-17 DIAGNOSIS — K648 Other hemorrhoids: Secondary | ICD-10-CM | POA: Insufficient documentation

## 2018-01-17 DIAGNOSIS — D123 Benign neoplasm of transverse colon: Secondary | ICD-10-CM | POA: Insufficient documentation

## 2018-01-17 DIAGNOSIS — I10 Essential (primary) hypertension: Secondary | ICD-10-CM | POA: Insufficient documentation

## 2018-01-17 DIAGNOSIS — Z888 Allergy status to other drugs, medicaments and biological substances status: Secondary | ICD-10-CM | POA: Diagnosis not present

## 2018-01-17 DIAGNOSIS — K5730 Diverticulosis of large intestine without perforation or abscess without bleeding: Secondary | ICD-10-CM | POA: Insufficient documentation

## 2018-01-17 DIAGNOSIS — J45909 Unspecified asthma, uncomplicated: Secondary | ICD-10-CM | POA: Insufficient documentation

## 2018-01-17 DIAGNOSIS — R109 Unspecified abdominal pain: Secondary | ICD-10-CM

## 2018-01-17 HISTORY — PX: COLONOSCOPY WITH PROPOFOL: SHX5780

## 2018-01-17 HISTORY — PX: ESOPHAGOGASTRODUODENOSCOPY (EGD) WITH PROPOFOL: SHX5813

## 2018-01-17 HISTORY — PX: POLYPECTOMY: SHX5525

## 2018-01-17 HISTORY — PX: BIOPSY: SHX5522

## 2018-01-17 SURGERY — COLONOSCOPY WITH PROPOFOL
Anesthesia: General

## 2018-01-17 MED ORDER — PANTOPRAZOLE SODIUM 40 MG PO TBEC: 40.0000 mg | DELAYED_RELEASE_TABLET | Freq: Every day | ORAL | 5 refills | Status: DC

## 2018-01-17 MED ORDER — CHLORHEXIDINE GLUCONATE CLOTH 2 % EX PADS: 6.0000 | MEDICATED_PAD | Freq: Once | CUTANEOUS | Status: DC

## 2018-01-17 MED ORDER — PROPOFOL 10 MG/ML IV BOLUS
INTRAVENOUS | Status: AC
  Filled 2018-01-17: qty 80

## 2018-01-17 MED ORDER — PROPOFOL 10 MG/ML IV BOLUS
INTRAVENOUS | Status: DC | PRN
  Administered 2018-01-17 (×4): 20 mg via INTRAVENOUS

## 2018-01-17 MED ORDER — ARTIFICIAL TEARS OPHTHALMIC OINT
TOPICAL_OINTMENT | OPHTHALMIC | Status: AC
  Filled 2018-01-17: qty 3.5

## 2018-01-17 MED ORDER — LACTATED RINGERS IV SOLN
INTRAVENOUS | Status: DC | PRN
  Administered 2018-01-17: 07:00:00 via INTRAVENOUS

## 2018-01-17 MED ORDER — PROPOFOL 500 MG/50ML IV EMUL
INTRAVENOUS | Status: DC | PRN
  Administered 2018-01-17: 100 ug/kg/min via INTRAVENOUS
  Administered 2018-01-17 (×2): 125 ug/kg/min via INTRAVENOUS

## 2018-01-17 MED ORDER — LIDOCAINE VISCOUS 2 % MT SOLN
15.0000 mL | Freq: Once | OROMUCOSAL | Status: AC
  Administered 2018-01-17: 15 mL via OROMUCOSAL

## 2018-01-17 MED ORDER — LIDOCAINE VISCOUS 2 % MT SOLN
OROMUCOSAL | Status: AC
  Filled 2018-01-17: qty 15

## 2018-01-17 MED ORDER — PROPOFOL 10 MG/ML IV BOLUS
INTRAVENOUS | Status: AC
  Filled 2018-01-17: qty 60

## 2018-01-17 NOTE — Transfer of Care (Signed)
Immediate Anesthesia Transfer of Care Note  Patient: Denise [name]  Procedure(s) Performed: COLONOSCOPY WITH PROPOFOL (N/A ) ESOPHAGOGASTRODUODENOSCOPY (EGD) WITH PROPOFOL (N/A ) BIOPSY POLYPECTOMY  Patient Location: PACU  Anesthesia Type:General  Level of Consciousness: awake, alert  and patient cooperative  Airway & Oxygen Therapy: Patient Spontanous Breathing  Post-op Assessment: Report given to RN and Post -op Vital signs reviewed and stable  Post vital signs: Reviewed and stable  Last Vitals:  Vitals Value Taken Time  BP    Temp    Pulse 73 01/17/2018  8:22 AM  Resp    SpO2 91 % 01/17/2018  8:22 AM  Vitals shown include unvalidated device data.  Last Pain:  Vitals:   01/17/18 8592  TempSrc: Oral  PainSc: 0-No pain         Complications: No apparent anesthesia complications

## 2018-01-17 NOTE — Interval H&P Note (Signed)
lHistory and Physical Interval Note:  01/17/2018 7:13 AM  Denise [name]  has presented today for surgery, with the diagnosis of Nausea Vomiting , Abdominal Pain, Rectal Bleeding  The various methods of treatment have been discussed with the patient and family. After consideration of risks, benefits and other options for treatment, the patient has consented to  Procedure(s) with comments: COLONOSCOPY WITH PROPOFOL (N/A) - 7:30 ESOPHAGOGASTRODUODENOSCOPY (EGD) WITH PROPOFOL (N/A) as a surgical intervention .  The patient's history has been reviewed, patient examined, no change in status, stable for surgery.  I have reviewed the patient's chart and labs.  Questions were answered to the patient's satisfaction.   I have reviewed patient's CBC and comprehensive chemistry panel from last week and it is within normal limits.   [employer]

## 2018-01-17 NOTE — Anesthesia Procedure Notes (Signed)
Procedure Name: MAC Date/Time: 01/17/2018 7:25 AM Performed by: Vista Deck, CRNA Pre-anesthesia Checklist: Patient identified, Emergency Drugs available, Suction available, Timeout performed and Patient being monitored Patient Re-evaluated:Patient Re-evaluated prior to induction Oxygen Delivery Method: Non-rebreather mask

## 2018-01-17 NOTE — Discharge Instructions (Signed)
Keep Excedrin use to minimum. Resume other medications as before. Pantoprazole 40 mg by mouth 30 minutes before breakfast daily. No driving for 24 hours. Physician will call with biopsy results.    Esophagogastroduodenoscopy, Care After Refer to this sheet in the next few weeks. These instructions provide you with information about caring for yourself after your procedure. Your health care provider may also give you more specific instructions. Your treatment has been planned according to current medical practices, but problems sometimes occur. Call your health care provider if you have any problems or questions after your procedure. What can I expect after the procedure? After the procedure, it is common to have:  A sore throat.  Nausea.  Bloating.  Dizziness.  Fatigue.  Follow these instructions at home:  Do not eat or drink anything until the numbing medicine (local anesthetic) has worn off and your gag reflex has returned. You will know that the local anesthetic has worn off when you can swallow comfortably.  Do not drive for 24 hours if you received a medicine to help you relax (sedative).  If your health care provider took a tissue sample for testing during the procedure, make sure to get your test results. This is your responsibility. Ask your health care provider or the department performing the test when your results will be ready.  Keep all follow-up visits as told by your health care provider. This is important. Contact a health care provider if:  You cannot stop coughing.  You are not urinating.  You are urinating less than usual. Get help right away if:  You have trouble swallowing.  You cannot eat or drink.  You have throat or chest pain that gets worse.  You are dizzy or light-headed.  You faint.  You have nausea or vomiting.  You have chills.  You have a fever.  You have severe abdominal pain.  You have black, tarry, or bloody stools. This  information is not intended to replace advice given to you by your health care provider. Make sure you discuss any questions you have with your health care provider. Document Released: 08/17/2012 Document Revised: 02/06/2016 Document Reviewed: 07/25/2015 Elsevier Interactive Patient Education  2018 [employer]. Colonoscopy, Adult, Care After This sheet gives you information about how to care for yourself after your procedure. Your health care provider may also give you more specific instructions. If you have problems or questions, contact your health care provider. What can I expect after the procedure? After the procedure, it is common to have:  A small amount of blood in your stool for 24 hours after the procedure.  Some gas.  Mild abdominal cramping or bloating.  Follow these instructions at home: General instructions   For the first 24 hours after the procedure: ? Do not drive or use machinery. ? Do not sign important documents. ? Do not drink alcohol. ? Do your regular daily activities at a slower pace than normal. ? Eat soft, easy-to-digest foods. ? Rest often.  Take over-the-counter or prescription medicines only as told by your health care provider.  It is up to you to get the results of your procedure. Ask your health care provider, or the department performing the procedure, when your results will be ready. Relieving cramping and bloating  Try walking around when you have cramps or feel bloated.  Apply heat to your abdomen as told by your health care provider. Use a heat source that your health care provider recommends, such as a moist heat  pack or a heating pad. ? Place a towel between your skin and the heat source. ? Leave the heat on for 20-30 minutes. ? Remove the heat if your skin turns bright red. This is especially important if you are unable to feel pain, heat, or cold. You may have a greater risk of getting burned. Eating and drinking  Drink enough fluid to  keep your urine clear or pale yellow.  Resume your normal diet as instructed by your health care provider. Avoid heavy or fried foods that are hard to digest.  Avoid drinking alcohol for as long as instructed by your health care provider. Contact a health care provider if:  You have blood in your stool 2-3 days after the procedure. Get help right away if:  You have more than a small spotting of blood in your stool.  You pass large blood clots in your stool.  Your abdomen is swollen.  You have nausea or vomiting.  You have a fever.  You have increasing abdominal pain that is not relieved with medicine. This information is not intended to replace advice given to you by your health care provider. Make sure you discuss any questions you have with your health care provider. Document Released: 04/14/2004 Document Revised: 05/25/2016 Document Reviewed: 11/12/2015 Elsevier Interactive Patient Education  [employer].

## 2018-01-17 NOTE — Op Note (Signed)
[facility] Patient Name: Denise [name] Procedure Date: 01/17/2018 7:23 AM MRN: [id number] Date of Birth: 1951/03/05 Attending MD: Hildred Laser , MD CSN: [id number] Age: 67 Admit Type: Outpatient Procedure:                Upper GI endoscopy Indications:              Nausea with vomiting Providers:                Hildred Laser, MD, Lurline Del, RN, Nelma Rothman,                            Technician Referring MD:             Stoney Bang, MD Medicines:                Lidocaine spray, Propofol per Anesthesia Complications:            No immediate complications. Estimated Blood Loss:     Estimated blood loss was minimal. Procedure:                Pre-Anesthesia Assessment:                           - Prior to the procedure, a History and Physical                            was performed, and patient medications and                            allergies were reviewed. The patient's tolerance of                            previous anesthesia was also reviewed. The risks                            and benefits of the procedure and the sedation                            options and risks were discussed with the patient.                            All questions were answered, and informed consent                            was obtained. Prior Anticoagulants: The patient                            last took previous NSAID medication 5 days prior to                            the procedure. ASA Grade Assessment: II - A patient                            with mild systemic disease. After reviewing the  risks and benefits, the patient was deemed in                            satisfactory condition to undergo the procedure.                           After obtaining informed consent, the endoscope was                            passed under direct vision. Throughout the                            procedure, the patient's blood pressure, pulse, and        oxygen saturations were monitored continuously. The                            [id number] ([id number]) scope was introduced through the                            and advanced to the second part of duodenum. The                            upper GI endoscopy was accomplished without                            difficulty. The patient tolerated the procedure                            well. Scope In: 7:33:53 AM Scope Out: 7:43:27 AM Total Procedure Duration: 0 hours 9 minutes 34 seconds  Findings:      The examined esophagus was normal except at GEJ.      LA Grade A (one or more mucosal breaks less than 5 mm, not extending       between tops of 2 mucosal folds) esophagitis was found 36 cm from the       incisors.      A 2 cm hiatal hernia was present.      A healed ulcer was found in the gastric antrum.      A few erosions were found in the gastric antrum and in the prepyloric       region of the stomach. Biopsies were taken with a cold forceps for       histology. The pathology specimen was placed into Bottle Number 1.      The exam of the stomach was otherwise normal.      The duodenal bulb and second portion of the duodenum were normal.       Biopsies were taken with a cold forceps for histology. The pathology       specimen was placed into Bottle Number 2. Impression:               - Normal esophagus.                           - LA Grade A reflux esophagitis. Single erosion at  GEJ.                           - 2 cm hiatal hernia.                           - Scar in the gastric antrum.                           - Erosive gastropathy. Biopsied.                           - Normal duodenal bulb and second portion of the                            duodenum. Biopsied. Moderate Sedation:      Per Anesthesia Care Recommendation:           - Patient has a contact number available for                            emergencies. The signs and symptoms of potential                             delayed complications were discussed with the                            patient. Return to normal activities tomorrow.                            Written discharge instructions were provided to the                            patient.                           - High fiber diet today.                           - Continue present medications.                           - No aspirin, ibuprofen, naproxen, or other                            non-steroidal anti-inflammatory drugs.                           - Await pathology results.                           - Use Protonix (pantoprazole) 40 mg PO daily.                           - See the other procedure note for documentation of  additional recommendations. Procedure Code(s):        --- Professional ---                           [phone], Esophagogastroduodenoscopy, flexible,                            transoral; with biopsy, single or multiple Diagnosis Code(s):        --- Professional ---                           K21.0, Gastro-esophageal reflux disease with                            esophagitis                           K44.9, Diaphragmatic hernia without obstruction or                            gangrene                           K31.89, Other diseases of stomach and duodenum                           R11.2, Nausea with vomiting, unspecified CPT copyright 2017 American Medical Association. All rights reserved. The codes documented in this report are preliminary and upon coder review may  be revised to meet current compliance requirements. Hildred Laser, MD Hildred Laser, MD 01/17/2018 8:23:37 AM This report has been signed electronically. Number of Addenda: 0

## 2018-01-17 NOTE — Anesthesia Postprocedure Evaluation (Signed)
Anesthesia Post Note  Patient: Denise [name]  Procedure(s) Performed: COLONOSCOPY WITH PROPOFOL (N/A ) ESOPHAGOGASTRODUODENOSCOPY (EGD) WITH PROPOFOL (N/A ) BIOPSY POLYPECTOMY  Patient location during evaluation: PACU Anesthesia Type: General Level of consciousness: awake and alert Pain management: satisfactory to patient Vital Signs Assessment: post-procedure vital signs reviewed and stable Respiratory status: spontaneous breathing Cardiovascular status: stable Postop Assessment: no apparent nausea or vomiting Anesthetic complications: no     Last Vitals:  Vitals:   01/17/18 0830 01/17/18 0838  BP: 105/69   Pulse: 60 (!) 58  Resp: 14 15  Temp:    SpO2: 100% 97%    Last Pain:  Vitals:   01/17/18 0838  TempSrc:   PainSc: 0-No pain                 Shere Eisenhart

## 2018-01-17 NOTE — Anesthesia Preprocedure Evaluation (Addendum)
Anesthesia Evaluation   Patient awake    Airway Mallampati: I  TM Distance: >3 FB Neck ROM: Full    Dental   Pulmonary asthma ,  No inhalers for 3 months   Pulmonary exam normal        Cardiovascular Exercise Tolerance: Good hypertension, Pt. on medications Normal cardiovascular examI     Neuro/Psych negative neurological ROS  negative psych ROS   GI/Hepatic negative GI ROS, Neg liver ROS,   Endo/Other  Hypothyroidism On meds took this AM  Renal/GU negative Renal ROS     Musculoskeletal   Abdominal Normal abdominal exam  (+)   Peds  Hematology   Anesthesia Other Findings   Reproductive/Obstetrics                             Anesthesia Physical Anesthesia Plan  ASA: II  Anesthesia Plan: General   Post-op Pain Management:    Induction: Intravenous  PONV Risk Score and Plan:   Airway Management Planned: Nasal Cannula  Additional Equipment:   Intra-op Plan:   Post-operative Plan: Extubation in OR  Informed Consent: I have reviewed the patients History and Physical, chart, labs and discussed the procedure including the risks, benefits and alternatives for the proposed anesthesia with the patient or authorized representative who has indicated his/her understanding and acceptance.   Consent reviewed with POA  Plan Discussed with:   Anesthesia Plan Comments:         Anesthesia Quick Evaluation

## 2018-01-17 NOTE — Op Note (Signed)
[facility] Patient Name: Denise [name] Procedure Date: 01/17/2018 7:44 AM MRN: [id number] Date of Birth: 1951-04-08 Attending MD: Hildred Laser , MD CSN: [id number] Age: 67 Admit Type: Outpatient Procedure:                Colonoscopy Indications:              Rectal bleeding, Diarrhea Providers:                Hildred Laser, MD, Lurline Del, RN, Nelma Rothman,                            Technician Referring MD:             Stoney Bang, MD Medicines:                Propofol per Anesthesia Complications:            No immediate complications. Estimated Blood Loss:     Estimated blood loss was minimal. Procedure:                Pre-Anesthesia Assessment:                           - Prior to the procedure, a History and Physical                            was performed, and patient medications and                            allergies were reviewed. The patient's tolerance of                            previous anesthesia was also reviewed. The risks                            and benefits of the procedure and the sedation                            options and risks were discussed with the patient.                            All questions were answered, and informed consent                            was obtained. Prior Anticoagulants: The patient                            last took previous NSAID medication 5 days prior to                            the procedure. ASA Grade Assessment: II - A patient                            with mild systemic disease. After reviewing the  risks and benefits, the patient was deemed in                            satisfactory condition to undergo the procedure.                           After obtaining informed consent, the colonoscope                            was passed under direct vision. Throughout the                            procedure, the patient's blood pressure, pulse, and                            oxygen  saturations were monitored continuously. The                            EC-[id number] ([id number]) scope was introduced through                            the anus and advanced to the the cecum, identified                            by appendiceal orifice and ileocecal valve. The                            colonoscopy was performed without difficulty. The                            patient tolerated the procedure well. The quality                            of the bowel preparation was adequate. The                            ileocecal valve, appendiceal orifice, and rectum                            were photographed. Scope In: 7:49:39 AM Scope Out: 8:13:44 AM Scope Withdrawal Time: 0 hours 14 minutes 8 seconds  Total Procedure Duration: 0 hours 24 minutes 5 seconds  Findings:      The perianal and digital rectal examinations were normal.      A small polyp was found in the cecum. The polyp was sessile. Biopsies       were taken with a cold forceps for histology. The pathology specimen was       placed into Bottle Number 3.      A 5 mm polyp was found in the distal transverse colon. The polyp was       sessile. The polyp was removed with a cold snare. Resection and       retrieval were complete. The pathology specimen was placed into Bottle       Number 3.      Multiple small and medium-mouthed diverticula  were found in the sigmoid       colon.      Internal hemorrhoids were found during retroflexion. The hemorrhoids       were small. Impression:               - One small polyp in the cecum. Biopsied.                           - One 5 mm polyp in the distal transverse colon,                            removed with a cold snare. Resected and retrieved.                           - Diverticulosis in the sigmoid colon.                           - Internal hemorrhoids. Moderate Sedation:      Per Anesthesia Care Recommendation:           - Patient has a contact number available for                             emergencies. The signs and symptoms of potential                            delayed complications were discussed with the                            patient. Return to normal activities tomorrow.                            Written discharge instructions were provided to the                            patient.                           - High fiber diet today.                           - Continue present medications.                           - Await pathology results.                           - Repeat colonoscopy for surveillance based on                            pathology results. Procedure Code(s):        --- Professional ---                           [id number], Colonoscopy, flexible; with removal of  tumor(s), polyp(s), or other lesion(s) by snare                            technique                           45380, 59, Colonoscopy, flexible; with biopsy,                            single or multiple Diagnosis Code(s):        --- Professional ---                           D12.0, Benign neoplasm of cecum                           D12.3, Benign neoplasm of transverse colon (hepatic                            flexure or splenic flexure)                           K64.8, Other hemorrhoids                           K62.5, Hemorrhage of anus and rectum                           R19.7, Diarrhea, unspecified                           K57.30, Diverticulosis of large intestine without                            perforation or abscess without bleeding CPT copyright 2017 American Medical Association. All rights reserved. The codes documented in this report are preliminary and upon coder review may  be revised to meet current compliance requirements. Hildred Laser, MD Hildred Laser, MD 01/17/2018 8:28:37 AM This report has been signed electronically. Number of Addenda: 0

## 2018-01-21 ENCOUNTER — Encounter (HOSPITAL_COMMUNITY): Payer: Self-pay | Admitting: Internal Medicine

## 2018-02-14 DIAGNOSIS — E782 Mixed hyperlipidemia: Secondary | ICD-10-CM | POA: Diagnosis not present

## 2018-02-14 DIAGNOSIS — I10 Essential (primary) hypertension: Secondary | ICD-10-CM | POA: Diagnosis not present

## 2018-02-14 DIAGNOSIS — E038 Other specified hypothyroidism: Secondary | ICD-10-CM | POA: Diagnosis not present

## 2018-03-23 ENCOUNTER — Telehealth (INDEPENDENT_AMBULATORY_CARE_PROVIDER_SITE_OTHER): Payer: Self-pay | Admitting: Internal Medicine

## 2018-03-23 NOTE — Telephone Encounter (Signed)
Patient called stated that yesterday her back was hurting and she was feeling feverish - she did have some blood in her stool - but today she is feeling better - she wanted to let you know this

## 2018-03-24 NOTE — Telephone Encounter (Signed)
Above-noted. She had colonoscopy recently and she did have internal hemorrhoids. No further recommendations at this time. Please asked patient to give [place] update next week.

## 2018-03-25 DIAGNOSIS — R0602 Shortness of breath: Secondary | ICD-10-CM | POA: Diagnosis not present

## 2018-03-25 DIAGNOSIS — M545 Low back pain: Secondary | ICD-10-CM | POA: Diagnosis not present

## 2018-03-25 DIAGNOSIS — Z888 Allergy status to other drugs, medicaments and biological substances status: Secondary | ICD-10-CM | POA: Diagnosis not present

## 2018-03-25 DIAGNOSIS — I10 Essential (primary) hypertension: Secondary | ICD-10-CM | POA: Diagnosis not present

## 2018-03-25 DIAGNOSIS — Z79899 Other long term (current) drug therapy: Secondary | ICD-10-CM | POA: Diagnosis not present

## 2018-04-01 DIAGNOSIS — Z79899 Other long term (current) drug therapy: Secondary | ICD-10-CM | POA: Diagnosis not present

## 2018-04-01 DIAGNOSIS — I10 Essential (primary) hypertension: Secondary | ICD-10-CM | POA: Diagnosis not present

## 2018-04-01 DIAGNOSIS — M62838 Other muscle spasm: Secondary | ICD-10-CM | POA: Diagnosis not present

## 2018-04-01 DIAGNOSIS — M542 Cervicalgia: Secondary | ICD-10-CM | POA: Diagnosis not present

## 2018-04-01 DIAGNOSIS — R42 Dizziness and giddiness: Secondary | ICD-10-CM | POA: Diagnosis not present

## 2018-04-01 DIAGNOSIS — E039 Hypothyroidism, unspecified: Secondary | ICD-10-CM | POA: Diagnosis not present

## 2018-04-01 DIAGNOSIS — R51 Headache: Secondary | ICD-10-CM | POA: Diagnosis not present

## 2018-04-01 DIAGNOSIS — H9319 Tinnitus, unspecified ear: Secondary | ICD-10-CM | POA: Diagnosis not present

## 2018-04-14 ENCOUNTER — Other Ambulatory Visit (INDEPENDENT_AMBULATORY_CARE_PROVIDER_SITE_OTHER): Payer: Self-pay | Admitting: Internal Medicine

## 2018-04-20 ENCOUNTER — Encounter (HOSPITAL_COMMUNITY): Payer: Self-pay | Admitting: Internal Medicine

## 2018-05-10 ENCOUNTER — Ambulatory Visit (INDEPENDENT_AMBULATORY_CARE_PROVIDER_SITE_OTHER): Payer: Medicare Other | Admitting: Internal Medicine

## 2018-05-18 DIAGNOSIS — E038 Other specified hypothyroidism: Secondary | ICD-10-CM | POA: Diagnosis not present

## 2018-05-18 DIAGNOSIS — I10 Essential (primary) hypertension: Secondary | ICD-10-CM | POA: Diagnosis not present

## 2018-05-18 DIAGNOSIS — Z0000 Encounter for general adult medical examination without abnormal findings: Secondary | ICD-10-CM | POA: Diagnosis not present

## 2018-05-18 DIAGNOSIS — E782 Mixed hyperlipidemia: Secondary | ICD-10-CM | POA: Diagnosis not present

## 2018-06-03 DIAGNOSIS — Z9049 Acquired absence of other specified parts of digestive tract: Secondary | ICD-10-CM | POA: Diagnosis not present

## 2018-06-03 DIAGNOSIS — I10 Essential (primary) hypertension: Secondary | ICD-10-CM | POA: Diagnosis not present

## 2018-06-03 DIAGNOSIS — K529 Noninfective gastroenteritis and colitis, unspecified: Secondary | ICD-10-CM | POA: Diagnosis not present

## 2018-06-03 DIAGNOSIS — E039 Hypothyroidism, unspecified: Secondary | ICD-10-CM | POA: Diagnosis not present

## 2018-06-03 DIAGNOSIS — Z79899 Other long term (current) drug therapy: Secondary | ICD-10-CM | POA: Diagnosis not present

## 2018-06-03 DIAGNOSIS — R42 Dizziness and giddiness: Secondary | ICD-10-CM | POA: Diagnosis not present

## 2018-06-03 DIAGNOSIS — R1030 Lower abdominal pain, unspecified: Secondary | ICD-10-CM | POA: Diagnosis not present

## 2018-06-03 DIAGNOSIS — R350 Frequency of micturition: Secondary | ICD-10-CM | POA: Diagnosis not present

## 2018-06-03 DIAGNOSIS — Z90710 Acquired absence of both cervix and uterus: Secondary | ICD-10-CM | POA: Diagnosis not present

## 2018-08-09 ENCOUNTER — Other Ambulatory Visit (INDEPENDENT_AMBULATORY_CARE_PROVIDER_SITE_OTHER): Payer: Self-pay | Admitting: Internal Medicine

## 2018-08-17 DIAGNOSIS — G5601 Carpal tunnel syndrome, right upper limb: Secondary | ICD-10-CM | POA: Diagnosis not present

## 2018-08-17 DIAGNOSIS — E782 Mixed hyperlipidemia: Secondary | ICD-10-CM | POA: Diagnosis not present

## 2018-08-17 DIAGNOSIS — I10 Essential (primary) hypertension: Secondary | ICD-10-CM | POA: Diagnosis not present

## 2018-08-17 DIAGNOSIS — E038 Other specified hypothyroidism: Secondary | ICD-10-CM | POA: Diagnosis not present

## 2018-09-27 DIAGNOSIS — Z780 Asymptomatic menopausal state: Secondary | ICD-10-CM | POA: Diagnosis not present

## 2018-09-27 DIAGNOSIS — Z79899 Other long term (current) drug therapy: Secondary | ICD-10-CM | POA: Diagnosis not present

## 2018-09-27 DIAGNOSIS — E039 Hypothyroidism, unspecified: Secondary | ICD-10-CM | POA: Diagnosis present

## 2018-09-27 DIAGNOSIS — K921 Melena: Secondary | ICD-10-CM | POA: Diagnosis present

## 2018-09-27 DIAGNOSIS — K559 Vascular disorder of intestine, unspecified: Secondary | ICD-10-CM | POA: Diagnosis present

## 2018-09-27 DIAGNOSIS — Z881 Allergy status to other antibiotic agents status: Secondary | ICD-10-CM | POA: Diagnosis not present

## 2018-09-27 DIAGNOSIS — I10 Essential (primary) hypertension: Secondary | ICD-10-CM | POA: Diagnosis present

## 2018-09-27 DIAGNOSIS — K5730 Diverticulosis of large intestine without perforation or abscess without bleeding: Secondary | ICD-10-CM | POA: Diagnosis not present

## 2018-09-27 DIAGNOSIS — Z882 Allergy status to sulfonamides status: Secondary | ICD-10-CM | POA: Diagnosis not present

## 2018-09-27 DIAGNOSIS — K5790 Diverticulosis of intestine, part unspecified, without perforation or abscess without bleeding: Secondary | ICD-10-CM | POA: Diagnosis present

## 2018-09-27 DIAGNOSIS — K219 Gastro-esophageal reflux disease without esophagitis: Secondary | ICD-10-CM | POA: Diagnosis present

## 2018-10-05 DIAGNOSIS — K529 Noninfective gastroenteritis and colitis, unspecified: Secondary | ICD-10-CM | POA: Diagnosis not present

## 2018-10-26 DIAGNOSIS — H2513 Age-related nuclear cataract, bilateral: Secondary | ICD-10-CM | POA: Diagnosis not present

## 2018-10-26 DIAGNOSIS — H40033 Anatomical narrow angle, bilateral: Secondary | ICD-10-CM | POA: Diagnosis not present

## 2018-11-11 DIAGNOSIS — H25812 Combined forms of age-related cataract, left eye: Secondary | ICD-10-CM | POA: Diagnosis not present

## 2018-11-11 DIAGNOSIS — H2512 Age-related nuclear cataract, left eye: Secondary | ICD-10-CM | POA: Diagnosis not present

## 2018-11-11 DIAGNOSIS — Z01818 Encounter for other preprocedural examination: Secondary | ICD-10-CM | POA: Diagnosis not present

## 2018-11-16 DIAGNOSIS — E038 Other specified hypothyroidism: Secondary | ICD-10-CM | POA: Diagnosis not present

## 2018-11-16 DIAGNOSIS — I10 Essential (primary) hypertension: Secondary | ICD-10-CM | POA: Diagnosis not present

## 2018-11-16 DIAGNOSIS — K55059 Acute (reversible) ischemia of intestine, part and extent unspecified: Secondary | ICD-10-CM | POA: Diagnosis not present

## 2019-04-20 DIAGNOSIS — K55059 Acute (reversible) ischemia of intestine, part and extent unspecified: Secondary | ICD-10-CM | POA: Diagnosis not present

## 2019-04-20 DIAGNOSIS — I10 Essential (primary) hypertension: Secondary | ICD-10-CM | POA: Diagnosis not present

## 2019-04-20 DIAGNOSIS — E785 Hyperlipidemia, unspecified: Secondary | ICD-10-CM | POA: Diagnosis not present

## 2019-04-20 DIAGNOSIS — E038 Other specified hypothyroidism: Secondary | ICD-10-CM | POA: Diagnosis not present

## 2019-04-28 DIAGNOSIS — E038 Other specified hypothyroidism: Secondary | ICD-10-CM | POA: Diagnosis not present

## 2019-04-28 DIAGNOSIS — I10 Essential (primary) hypertension: Secondary | ICD-10-CM | POA: Diagnosis not present

## 2019-04-28 DIAGNOSIS — E785 Hyperlipidemia, unspecified: Secondary | ICD-10-CM | POA: Diagnosis not present

## 2019-08-15 DIAGNOSIS — L570 Actinic keratosis: Secondary | ICD-10-CM | POA: Diagnosis not present

## 2019-08-15 DIAGNOSIS — E038 Other specified hypothyroidism: Secondary | ICD-10-CM | POA: Diagnosis not present

## 2019-08-15 DIAGNOSIS — E785 Hyperlipidemia, unspecified: Secondary | ICD-10-CM | POA: Diagnosis not present

## 2019-08-15 DIAGNOSIS — K559 Vascular disorder of intestine, unspecified: Secondary | ICD-10-CM | POA: Diagnosis not present

## 2019-08-15 DIAGNOSIS — I10 Essential (primary) hypertension: Secondary | ICD-10-CM | POA: Diagnosis not present

## 2020-01-16 DIAGNOSIS — Z1331 Encounter for screening for depression: Secondary | ICD-10-CM | POA: Diagnosis not present

## 2020-01-16 DIAGNOSIS — E038 Other specified hypothyroidism: Secondary | ICD-10-CM | POA: Diagnosis not present

## 2020-01-16 DIAGNOSIS — E785 Hyperlipidemia, unspecified: Secondary | ICD-10-CM | POA: Diagnosis not present

## 2020-01-16 DIAGNOSIS — I10 Essential (primary) hypertension: Secondary | ICD-10-CM | POA: Diagnosis not present

## 2020-01-16 DIAGNOSIS — Z0000 Encounter for general adult medical examination without abnormal findings: Secondary | ICD-10-CM | POA: Diagnosis not present

## 2020-01-16 DIAGNOSIS — K559 Vascular disorder of intestine, unspecified: Secondary | ICD-10-CM | POA: Diagnosis not present

## 2020-05-10 DIAGNOSIS — E785 Hyperlipidemia, unspecified: Secondary | ICD-10-CM | POA: Diagnosis not present

## 2020-05-10 DIAGNOSIS — Z0000 Encounter for general adult medical examination without abnormal findings: Secondary | ICD-10-CM | POA: Diagnosis not present

## 2020-05-10 DIAGNOSIS — Z131 Encounter for screening for diabetes mellitus: Secondary | ICD-10-CM | POA: Diagnosis not present

## 2020-05-10 DIAGNOSIS — E038 Other specified hypothyroidism: Secondary | ICD-10-CM | POA: Diagnosis not present

## 2020-05-10 DIAGNOSIS — K559 Vascular disorder of intestine, unspecified: Secondary | ICD-10-CM | POA: Diagnosis not present

## 2020-05-10 DIAGNOSIS — Z1331 Encounter for screening for depression: Secondary | ICD-10-CM | POA: Diagnosis not present

## 2020-05-10 DIAGNOSIS — I10 Essential (primary) hypertension: Secondary | ICD-10-CM | POA: Diagnosis not present

## 2020-05-13 DIAGNOSIS — I10 Essential (primary) hypertension: Secondary | ICD-10-CM | POA: Diagnosis not present

## 2020-05-13 DIAGNOSIS — E038 Other specified hypothyroidism: Secondary | ICD-10-CM | POA: Diagnosis not present

## 2020-05-13 DIAGNOSIS — G5603 Carpal tunnel syndrome, bilateral upper limbs: Secondary | ICD-10-CM | POA: Diagnosis not present

## 2020-05-13 DIAGNOSIS — E785 Hyperlipidemia, unspecified: Secondary | ICD-10-CM | POA: Diagnosis not present

## 2020-05-13 DIAGNOSIS — K559 Vascular disorder of intestine, unspecified: Secondary | ICD-10-CM | POA: Diagnosis not present

## 2020-07-24 ENCOUNTER — Ambulatory Visit: Payer: Medicare Other | Admitting: Neurology

## 2020-07-26 ENCOUNTER — Ambulatory Visit: Payer: Medicare Other | Admitting: Neurology

## 2020-08-12 DIAGNOSIS — E785 Hyperlipidemia, unspecified: Secondary | ICD-10-CM | POA: Diagnosis not present

## 2020-08-12 DIAGNOSIS — I10 Essential (primary) hypertension: Secondary | ICD-10-CM | POA: Diagnosis not present

## 2020-08-12 DIAGNOSIS — E038 Other specified hypothyroidism: Secondary | ICD-10-CM | POA: Diagnosis not present

## 2020-08-12 DIAGNOSIS — G5603 Carpal tunnel syndrome, bilateral upper limbs: Secondary | ICD-10-CM | POA: Diagnosis not present

## 2020-08-12 DIAGNOSIS — K559 Vascular disorder of intestine, unspecified: Secondary | ICD-10-CM | POA: Diagnosis not present

## 2020-08-27 DIAGNOSIS — Z23 Encounter for immunization: Secondary | ICD-10-CM | POA: Diagnosis not present

## 2020-12-06 DIAGNOSIS — E785 Hyperlipidemia, unspecified: Secondary | ICD-10-CM | POA: Diagnosis not present

## 2020-12-06 DIAGNOSIS — G5603 Carpal tunnel syndrome, bilateral upper limbs: Secondary | ICD-10-CM | POA: Diagnosis not present

## 2020-12-06 DIAGNOSIS — I10 Essential (primary) hypertension: Secondary | ICD-10-CM | POA: Diagnosis not present

## 2020-12-06 DIAGNOSIS — E038 Other specified hypothyroidism: Secondary | ICD-10-CM | POA: Diagnosis not present

## 2020-12-06 DIAGNOSIS — K559 Vascular disorder of intestine, unspecified: Secondary | ICD-10-CM | POA: Diagnosis not present

## 2020-12-09 DIAGNOSIS — I10 Essential (primary) hypertension: Secondary | ICD-10-CM | POA: Diagnosis not present

## 2020-12-09 DIAGNOSIS — E785 Hyperlipidemia, unspecified: Secondary | ICD-10-CM | POA: Diagnosis not present

## 2020-12-09 DIAGNOSIS — G5603 Carpal tunnel syndrome, bilateral upper limbs: Secondary | ICD-10-CM | POA: Diagnosis not present

## 2020-12-09 DIAGNOSIS — K559 Vascular disorder of intestine, unspecified: Secondary | ICD-10-CM | POA: Diagnosis not present

## 2020-12-09 DIAGNOSIS — E038 Other specified hypothyroidism: Secondary | ICD-10-CM | POA: Diagnosis not present

## 2020-12-13 DIAGNOSIS — C801 Malignant (primary) neoplasm, unspecified: Secondary | ICD-10-CM

## 2020-12-13 HISTORY — DX: Malignant (primary) neoplasm, unspecified: C80.1

## 2021-01-08 DIAGNOSIS — N6322 Unspecified lump in the left breast, upper inner quadrant: Secondary | ICD-10-CM | POA: Diagnosis not present

## 2021-01-08 DIAGNOSIS — N6311 Unspecified lump in the right breast, upper outer quadrant: Secondary | ICD-10-CM | POA: Diagnosis not present

## 2021-01-08 DIAGNOSIS — N6325 Unspecified lump in the left breast, overlapping quadrants: Secondary | ICD-10-CM | POA: Diagnosis not present

## 2021-01-08 DIAGNOSIS — R590 Localized enlarged lymph nodes: Secondary | ICD-10-CM | POA: Diagnosis not present

## 2021-01-08 DIAGNOSIS — N6321 Unspecified lump in the left breast, upper outer quadrant: Secondary | ICD-10-CM | POA: Diagnosis not present

## 2021-01-08 DIAGNOSIS — R921 Mammographic calcification found on diagnostic imaging of breast: Secondary | ICD-10-CM | POA: Diagnosis not present

## 2021-01-15 DIAGNOSIS — C50812 Malignant neoplasm of overlapping sites of left female breast: Secondary | ICD-10-CM | POA: Diagnosis not present

## 2021-01-15 DIAGNOSIS — R590 Localized enlarged lymph nodes: Secondary | ICD-10-CM | POA: Diagnosis not present

## 2021-01-15 DIAGNOSIS — C773 Secondary and unspecified malignant neoplasm of axilla and upper limb lymph nodes: Secondary | ICD-10-CM | POA: Diagnosis not present

## 2021-01-15 DIAGNOSIS — N6325 Unspecified lump in the left breast, overlapping quadrants: Secondary | ICD-10-CM | POA: Diagnosis not present

## 2021-01-22 ENCOUNTER — Encounter (HOSPITAL_COMMUNITY): Payer: Self-pay

## 2021-01-22 ENCOUNTER — Other Ambulatory Visit: Payer: Self-pay

## 2021-01-22 NOTE — Progress Notes (Signed)
I have placed an introductory phone call to this patient. I introduced myself and explained my role in the patient's care. Social determinants of health questions were asked and answered appropriately by patient. Patient reports no current questions but reports that she has an upcoming appt with the surgeon next week. I provided my contact information and encouraged the patient to call with questions or concerns.

## 2021-01-23 ENCOUNTER — Inpatient Hospital Stay (HOSPITAL_COMMUNITY): Payer: Medicare Other | Attending: Hematology | Admitting: Hematology

## 2021-01-23 DIAGNOSIS — E039 Hypothyroidism, unspecified: Secondary | ICD-10-CM | POA: Insufficient documentation

## 2021-01-23 DIAGNOSIS — Z79899 Other long term (current) drug therapy: Secondary | ICD-10-CM | POA: Insufficient documentation

## 2021-01-23 DIAGNOSIS — Z170 Estrogen receptor positive status [ER+]: Secondary | ICD-10-CM | POA: Insufficient documentation

## 2021-01-23 DIAGNOSIS — I10 Essential (primary) hypertension: Secondary | ICD-10-CM | POA: Diagnosis not present

## 2021-01-23 DIAGNOSIS — J45909 Unspecified asthma, uncomplicated: Secondary | ICD-10-CM | POA: Insufficient documentation

## 2021-01-23 DIAGNOSIS — C50912 Malignant neoplasm of unspecified site of left female breast: Secondary | ICD-10-CM | POA: Diagnosis not present

## 2021-01-23 DIAGNOSIS — Z79890 Hormone replacement therapy (postmenopausal): Secondary | ICD-10-CM | POA: Insufficient documentation

## 2021-01-23 NOTE — Progress Notes (Signed)
[facility] [address], [place] [address]   Patient Care Team: Neale Burly, MD as PCP - General (Internal Medicine) Brien Mates, RN as Oncology Nurse Navigator (Oncology)  CHIEF COMPLAINTS/PURPOSE OF CONSULTATION:  Newly diagnosed left breast cancer  HISTORY OF PRESENTING ILLNESS:  Denise [name] 70 y.o. female is here because of recent diagnosis of left breast cancer with metastatic carcinoma in a lymph node, at the request of Dr. Sherrie Sport  I reviewed her records extensively and collaborated the history with the patient.  Today she reports feeling good. Her surgeon is Dr. Ninfa Linden and she will see on Tuesday. She has experienced chronic fibrocystic disease. She believed the new mass was a cyst until aching pain began and her nipple and seek medical attention. She has had several previous breast biopsies which were benign. She was experiencing a constant aching pain in her breast which she had not experienced with the previous cysts. She has a preference for [place] after getting bruising from a malfunctioned mammogram. She had a hysterectomy at age 81. She denies any new pains outside of the breast pain. She reports good general health. She had surgery on gall badder due to approx.130 Gallstones. She reports having an ischemic colon. She reports brusing on breast which she attributes a breat biopsy. Her last ultrasound was in 2020.   Her mother and sister have similar fibrocystic issues. 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 She works at [employer] in [place]. She has never smoked. She reports her mother had breast cancer as well as open heart surgery. Her maternal grandmother died at 16 of a thyroid disorder. Her Maternal great-aunt had a breast reduction due to unknown issues. Her father had lung cancer due to smoking.   SUMMARY OF ONCOLOGIC HISTORY: Oncology History   No history exists.    In terms of breast cancer risk profile:  She menarched at  early age of 41 and went to menopause at age 78  She had 3 pregnancy, her first child was born at age 54  She never received birth control pills for.  She was exposed to fertility medications or hormone replacement therapy for at least 8-10 years age [age].  She has positive family history of Breast/GYN/GI cancer   MEDICAL HISTORY:  Past Medical History:  Diagnosis Date  . Asthma    as child  . Complication of anesthesia    pateitn states,' I coded when I had my D&Cmany years ago.  Marland Kitchen Hypertension   . Hypothyroidism     SURGICAL HISTORY: Past Surgical History:  Procedure Laterality Date  . ABDOMINAL HYSTERECTOMY    . BIOPSY  01/17/2018   Procedure: BIOPSY;  Surgeon: Rogene Houston, MD;  [facility];  Service: Endoscopy;;  duodenum,gastric  . CHOLECYSTECTOMY    . COLONOSCOPY WITH PROPOFOL N/A 01/17/2018   Procedure: COLONOSCOPY WITH PROPOFOL;  Surgeon: Rogene Houston, MD;  [facility];  Service: Endoscopy;  Laterality: N/A;  7:30  . DILATION AND CURETTAGE OF UTERUS    . ESOPHAGOGASTRODUODENOSCOPY (EGD) WITH PROPOFOL N/A 01/17/2018   Procedure: ESOPHAGOGASTRODUODENOSCOPY (EGD) WITH PROPOFOL;  Surgeon: Rogene Houston, MD;  [facility];  Service: Endoscopy;  Laterality: N/A;  . POLYPECTOMY  01/17/2018   Procedure: POLYPECTOMY;  Surgeon: Rogene Houston, MD;  [facility];  Service: Endoscopy;;  transverse colon, cecal    SOCIAL HISTORY: Social History   Socioeconomic History  . Marital  status: Widowed    Spouse name: Not on file  . Number of children: 3  . Years of education: Not on file  . Highest education level: Not on file  Occupational History  . Occupation: [employer]  . Occupation: retired  Tobacco Use  . Smoking status: Never Smoker  . Smokeless tobacco: Never Used  Vaping Use  . Vaping Use: Never used  Substance and Sexual Activity  . Alcohol use: Never  . Drug use: Never  . Sexual activity: Yes     Birth control/protection: Surgical  Other Topics Concern  . Not on file  Social History Narrative  . Not on file   Social Determinants of Health   Financial Resource Strain: Low Risk   . Difficulty of Paying Living Expenses: Not hard at all  Food Insecurity: No Food Insecurity  . Worried About Charity fundraiser in the Last Year: Never true  . Ran Out of Food in the Last Year: Never true  Transportation Needs: No Transportation Needs  . Lack of Transportation (Medical): No  . Lack of Transportation (Non-Medical): No  Physical Activity: Insufficiently Active  . Days of Exercise per Week: 3 days  . Minutes of Exercise per Session: 30 min  Stress: No Stress Concern Present  . Feeling of Stress : Not at all  Social Connections: Moderately Integrated  . Frequency of Communication with Friends and Family: More than three times a week  . Frequency of Social Gatherings with Friends and Family: More than three times a week  . Attends Religious Services: More than 4 times per year  . Active Member of Clubs or Organizations: No  . Attends Archivist Meetings: More than 4 times per year  . Marital Status: Widowed  Intimate Partner Violence: Not At Risk  . Fear of Current or Ex-Partner: No  . Emotionally Abused: No  . Physically Abused: No  . Sexually Abused: No    FAMILY HISTORY: Family History  Problem Relation Age of Onset  . Breast cancer Mother   . Heart disease Mother   . Thyroid disease Mother   . Lung cancer Father   . Breast cancer Sister   . Thyroid disease Maternal Aunt   . Thyroid nodules Maternal Grandmother 35       goiter  . Thyroid disease Maternal Grandmother   . Heart disease Maternal Grandfather   . Heart disease Paternal Grandmother   . Heart disease Paternal Grandfather   . Thyroid disease Daughter   . Thyroid disease Daughter     ALLERGIES:  is allergic to exforge [amlodipine besylate-valsartan], cheese, strawberry extract, and yeast-related  products.  MEDICATIONS:  Current Outpatient Medications  Medication Sig Dispense Refill  . Acetaminophen (TYLENOL PO) Take 1 tablet by mouth as needed.    Marland Kitchen albuterol (VENTOLIN HFA) 108 (90 Base) MCG/ACT inhaler Inhale into the lungs.    . gabapentin (NEURONTIN) 300 MG capsule Take 1 capsule by mouth at bedtime.    . hydrochlorothiazide (MICROZIDE) 12.5 MG capsule Take 12.5 mg by mouth daily.    Marland Kitchen levothyroxine (SYNTHROID) 75 MCG tablet Take 100 mcg by mouth daily before breakfast.    . lisinopril (PRINIVIL,ZESTRIL) 40 MG tablet Take 40 mg by mouth daily.    . Melatonin 5 MG TABS Take 5 mg by mouth. As needed for sleep    . METAMUCIL FIBER PO Take by mouth.    . Omega-3 Fatty Acids (FISH OIL PO) Take by mouth.  No current facility-administered medications for this visit.    REVIEW OF SYSTEMS:   Review of Systems  Constitutional: Negative for appetite change and fatigue.  Cardiovascular: Positive for chest pain (left breast aching).    PHYSICAL EXAMINATION: ECOG PERFORMANCE STATUS: 1 - Symptomatic but completely ambulatory  There were no vitals filed for this visit. There were no vitals filed for this visit. Physical Exam Vitals reviewed.  Constitutional:      Appearance: Normal appearance.  Cardiovascular:     Rate and Rhythm: Normal rate and regular rhythm.     Pulses: Normal pulses.     Heart sounds: Normal heart sounds.  Pulmonary:     Effort: Pulmonary effort is normal.     Breath sounds: Normal breath sounds.  Chest:  Breasts:     Right: No swelling, inverted nipple, mass, nipple discharge, tenderness, axillary adenopathy or supraclavicular adenopathy.     Left: Inverted nipple, mass (RUQ 5 cm 12 - 2 o'clock) and axillary adenopathy (palpable freely mobile LN) present. No swelling, nipple discharge, tenderness or supraclavicular adenopathy.    Abdominal:     Palpations: Abdomen is soft. There is no hepatomegaly, splenomegaly or mass.     Tenderness: There is no  abdominal tenderness.     Comments: Scar from gallbladder surgery  Musculoskeletal:     Right lower leg: No edema.     Left lower leg: No edema.  Lymphadenopathy:     Upper Body:     Right upper body: No supraclavicular, axillary or pectoral adenopathy.     Left upper body: Axillary adenopathy (palpable freely mobile LN) present. No supraclavicular or pectoral adenopathy.  Neurological:     General: No focal deficit present.     Mental Status: She is alert and oriented to person, place, and time.  Psychiatric:        Mood and Affect: Mood normal.        Behavior: Behavior normal.     Breast Exam Chaperone: Thana Ates    LABORATORY DATA:  I have reviewed the data as listed No results found for this or any previous visit (from the past 2160 hour(s)).  RADIOGRAPHIC STUDIES: I have personally reviewed the radiological reports and agreed with the findings in the report. No results found.   ASSESSMENT:  1.  T3N1 (stage IIIa) triple negative invasive lobular carcinoma of the left breast: - She felt lump in her left breast for more than 6 months, but thought it was secondary to fibrocystic disease which she had all her life.  When she started having pain, she reached out to Dr. Sherrie Sport. - She previously had mammogram machine malfunction and had severe pain and traumatized by that experience.  She was having ultrasound of the breast every other year since then. - She was on hormone replacement therapy for close to 10 years (from age [age]). - Ultrasound of the left breast on 01/08/2021 showed hyper vascular hypoechoic mass at 12 o'clock position measuring 3.8 x 1.3 x 2.5 cm.  There are calcifications located within the mass.  Mass extends to the level of the skin with mild associated skin thickening.  At least 3 morphologically abnormal lymph nodes in the left axilla. - Mammogram showed irregular spiculated mass with associated calcifications in the retroareolar to upper left breast  measuring approximately 6 cm.  There are at least 4 morphologically abnormal lymph nodes identified in the left axilla. - Ultrasound-guided left breast and left axillary lymph node biopsy on 01/15/2021 - Pathology consistent  with invasive lobular carcinoma, E-cadherin negative.  ER/PR/HER2 negative.  HER2 2+ by IHC, negative by FISH.  Ki-67 is 5%.  Lymph node core biopsy was consistent with metastatic carcinoma.  Grade 2.  2.  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  She is non-smoker. - Mother died of breast cancer.  Maternal grandmother died very young in her 36s, sister has fibrocystic disease.  Father died of lung cancer and was a smoker.   PLAN:  1.  T3N1 triple negative invasive lobular carcinoma of the left breast: - We have discussed findings on the imaging studies as well as pathology report in detail. - I have recommended staging scans.  Due to scarcity of IV contrast, we will go ahead with a PET CT scan. - Tumor is E-cadherin negative, consistent with invasive lobular carcinoma. - In general invasive lobular carcinomas are ER positive and have a higher frequency of bilaterality and multicentricity. - She has an appointment to see Dr. Ninfa Linden on 01/28/2021.  I will reach out to him and also discuss her case at the tumor board. - In general, we do offer neoadjuvant chemotherapy for TNBC as it provides prognostic information and allows to identify patients with residual disease at high risk for relapse and add supplemental adjuvant regimens. - Because of personal and family history, I have recommended genetic testing. - I will see her back after the PET scan to review results and further plan.    Derek Jack, MD 01/23/21 7:57 AM  [facility] [phone]   I, Thana Ates, am acting as a scribe for Dr. Sanda Linger.  I, Derek Jack MD, have reviewed the above documentation for accuracy and  completeness, and I agree with the above.

## 2021-01-23 NOTE — Patient Instructions (Addendum)
[facility] at [facility], [place] Discharge Instructions  You were seen today by Dr. Delton Coombes. He went over your recent results. Your breast cancer is called Triple Negative Invasive Lobular Breast Cancer. You will be scheduled for a PET scan to stage your breast cancer. You will be referred to get genetic testing. Keep your appointment with Dr. Ninfa Linden. Dr. Delton Coombes will see you back in after receiving the results from your PET scan for follow up.   Thank you for choosing [facility] at [facility] to provide your oncology and hematology care.  To afford each patient quality time with our provider, please arrive at least 15 minutes before your scheduled appointment time.   If you have a lab appointment with the [facility] please come in thru the Main Entrance and check in at the main information desk  You need to re-schedule your appointment should you arrive 10 or more minutes late.  We strive to give you quality time with our providers, and arriving late affects you and other patients whose appointments are after yours.  Also, if you no show three or more times for appointments you may be dismissed from the clinic at the providers discretion.     Again, thank you for choosing [facility].  Our hope is that these requests will decrease the amount of time that you wait before being seen by our physicians.       _____________________________________________________________  Should you have questions after your visit to [facility], please contact our office at [phone] between the hours of 8:00 a.m. and 4:30 p.m.  Voicemails left after 4:00 p.m. will not be returned until the following business day.  For prescription refill requests, have your pharmacy contact our office and allow 72 hours.    Cancer Center Support Programs:   > Cancer Support Group  2nd Tuesday of the month 1pm-2pm, Journey Room

## 2021-01-27 ENCOUNTER — Encounter: Payer: Self-pay | Admitting: Genetic Counselor

## 2021-01-27 ENCOUNTER — Other Ambulatory Visit: Payer: Medicare Other

## 2021-01-28 ENCOUNTER — Encounter: Payer: Self-pay | Admitting: Genetic Counselor

## 2021-01-28 ENCOUNTER — Other Ambulatory Visit: Payer: Self-pay

## 2021-01-28 ENCOUNTER — Inpatient Hospital Stay: Payer: Medicare Other

## 2021-01-28 ENCOUNTER — Other Ambulatory Visit: Payer: Self-pay | Admitting: Surgery

## 2021-01-28 ENCOUNTER — Inpatient Hospital Stay: Payer: Medicare Other | Attending: Genetic Counselor | Admitting: Genetic Counselor

## 2021-01-28 DIAGNOSIS — C50912 Malignant neoplasm of unspecified site of left female breast: Secondary | ICD-10-CM | POA: Diagnosis not present

## 2021-01-28 DIAGNOSIS — Z803 Family history of malignant neoplasm of breast: Secondary | ICD-10-CM | POA: Diagnosis not present

## 2021-01-28 NOTE — Progress Notes (Signed)
REFERRING PROVIDER: Derek Jack, MD [address] [place],  [place] [address]  PRIMARY PROVIDER:  Neale Burly, MD  PRIMARY REASON FOR VISIT:  1. Invasive lobular carcinoma of left breast in female [facility])   2. Family history of breast cancer      HISTORY OF PRESENT ILLNESS:   Ms. Rathod, a 70 y.o. female, was seen for a [facility] cancer genetics consultation at the request of Dr. Delton Coombes due to a personal and family history of cancer.  Ms. Hyder presents to clinic today to discuss the possibility of a hereditary predisposition to cancer, genetic testing, and to further clarify her future cancer risks, as well as potential cancer risks for family members.   In April 2022, at the age of 8, Ms. Locurto was diagnosed with triple negative invasive lobular carcinoma of the left breast. The treatment plan includes surgery and possible chemotherapy and radiation.    CANCER HISTORY:  Oncology History   No history exists.     RISK FACTORS:  Menarche was at age 42.  First live birth at age 62.  OCP use for approximately 20 years.  Ovaries intact: one ovary intact.  Hysterectomy: yes.  Menopausal status: postmenopausal.  HRT use: 3 years. Colonoscopy: yes; 3 TAs. Mammogram within the last year: yes. Number of breast biopsies: 2. Up to date with pelvic exams: no. Any excessive radiation exposure in the past: no  Past Medical History:  Diagnosis Date  . Asthma    as child  . Complication of anesthesia    pateitn states,' I coded when I had my D&Cmany years ago.  . Family history of breast cancer   . Hypertension   . Hypothyroidism     Past Surgical History:  Procedure Laterality Date  . ABDOMINAL HYSTERECTOMY    . BIOPSY  01/17/2018   Procedure: BIOPSY;  Surgeon: Rogene Houston, MD;  [facility];  Service: Endoscopy;;  duodenum,gastric  . CHOLECYSTECTOMY    . COLONOSCOPY WITH PROPOFOL N/A 01/17/2018   Procedure: COLONOSCOPY WITH PROPOFOL;  Surgeon:  Rogene Houston, MD;  [facility];  Service: Endoscopy;  Laterality: N/A;  7:30  . DILATION AND CURETTAGE OF UTERUS    . ESOPHAGOGASTRODUODENOSCOPY (EGD) WITH PROPOFOL N/A 01/17/2018   Procedure: ESOPHAGOGASTRODUODENOSCOPY (EGD) WITH PROPOFOL;  Surgeon: Rogene Houston, MD;  [facility];  Service: Endoscopy;  Laterality: N/A;  . POLYPECTOMY  01/17/2018   Procedure: POLYPECTOMY;  Surgeon: Rogene Houston, MD;  [facility];  Service: Endoscopy;;  transverse colon, cecal    Social History   Socioeconomic History  . Marital status: Widowed    Spouse name: Not on file  . Number of children: 3  . Years of education: Not on file  . Highest education level: Not on file  Occupational History  . Occupation: [employer]  . Occupation: retired  Tobacco Use  . Smoking status: Never Smoker  . Smokeless tobacco: Never Used  Vaping Use  . Vaping Use: Never used  Substance and Sexual Activity  . Alcohol use: Never  . Drug use: Never  . Sexual activity: Yes    Birth control/protection: Surgical  Other Topics Concern  . Not on file  Social History Narrative  . Not on file   Social Determinants of Health   Financial Resource Strain: Low Risk   . Difficulty of Paying Living Expenses: Not hard at all  Food Insecurity: No Food Insecurity  . Worried  About Running Out of Food in the Last Year: Never true  . Ran Out of Food in the Last Year: Never true  Transportation Needs: No Transportation Needs  . Lack of Transportation (Medical): No  . Lack of Transportation (Non-Medical): No  Physical Activity: Insufficiently Active  . Days of Exercise per Week: 3 days  . Minutes of Exercise per Session: 30 min  Stress: No Stress Concern Present  . Feeling of Stress : Not at all  Social Connections: Moderately Integrated  . Frequency of Communication with Friends and Family: More than three times a week  . Frequency of Social Gatherings with Friends  and Family: More than three times a week  . Attends Religious Services: More than 4 times per year  . Active Member of Clubs or Organizations: No  . Attends Archivist Meetings: More than 4 times per year  . Marital Status: Widowed     FAMILY HISTORY:  We obtained a detailed, 4-generation family history.  Significant diagnoses are listed below: Family History  Problem Relation Age of Onset  . Breast cancer Mother        dx in her 34s  . Heart disease Mother   . Thyroid disease Mother   . Lung cancer Father        dx in his 55s  . Thyroid disease Maternal Aunt   . Thyroid nodules Maternal Grandmother 35       goiter  . Thyroid disease Maternal Grandmother   . Heart disease Maternal Grandfather   . Heart disease Paternal Grandmother   . Heart disease Paternal Grandfather   . Thyroid disease Daughter   . Thyroid disease Daughter   . Cancer Maternal Uncle        NOS  . Cancer Paternal Uncle        NOS  . Breast cancer Cousin        pat first cousin died in her 41s;     The patient has two daughters and a son who are cancer free.  She has two brothers and a sister, all cancer free.  Both parents are deceased.  The patient's father died of lung cancer.  He had a brother and sister, and the brother had a cancer NOS.  That brother had a daughter who died of breast cancer in her 8's.  The paternal grandparents are deceased.  The patient's mother was diagnosed with breast cancer in her 24's and died at 21.  She had three brothers, two had cancer NOS.  The maternal grandparents are deceased.  Ms. Simao is unaware of previous family history of genetic testing for hereditary cancer risks. Patient's maternal ancestors are of [place] and [place] descent, and paternal ancestors are of [place] and [place] descent. There is no reported Ashkenazi Jewish ancestry. There is no known consanguinity.  GENETIC COUNSELING ASSESSMENT: Ms. Olesen is a 70 y.o. female with a personal and  family history of cancer which is somewhat suggestive of a hereditary cancer syndrome and predisposition to cancer given her triple negative status of breast cancer. We, therefore, discussed and recommended the following at today's visit.   DISCUSSION: We discussed that 5 - 10% of breast cancer is hereditary, with most cases associated with BRCA mutations.  There are other genes that can be associated with hereditary breast cancer syndromes.  These include ATM, CHEK2 and PALB2.  We discussed that testing is beneficial for several reasons including knowing how to follow individuals after completing their treatment and understand  if other family members could be at risk for cancer and allow them to undergo genetic testing.   We reviewed the characteristics, features and inheritance patterns of hereditary cancer syndromes. We also discussed genetic testing, including the appropriate family members to test, the process of testing, insurance coverage and turn-around-time for results. We discussed the implications of a negative, positive, carrier and/or variant of uncertain significant result. We recommended Ms. Evelene Croon pursue genetic testing for the CancerNext-Expanded+RNAinsight gene panel. The CancerNext-Expanded gene panel offered by [facility] and includes sequencing and rearrangement analysis for the following 77 genes: AIP, ALK, APC*, ATM*, AXIN2, BAP1, BARD1, BLM, BMPR1A, BRCA1*, BRCA2*, BRIP1*, CDC73, CDH1*, CDK4, CDKN1B, CDKN2A, CHEK2*, CTNNA1, DICER1, FANCC, FH, FLCN, GALNT12, KIF1B, LZTR1, MAX, MEN1, MET, MLH1*, MSH2*, MSH3, MSH6*, MUTYH*, NBN, NF1*, NF2, NTHL1, PALB2*, PHOX2B, PMS2*, POT1, PRKAR1A, PTCH1, PTEN*, RAD51C*, RAD51D*, RB1, RECQL, RET, SDHA, SDHAF2, SDHB, SDHC, SDHD, SMAD4, SMARCA4, SMARCB1, SMARCE1, STK11, SUFU, TMEM127, TP53*, TSC1, TSC2, VHL and XRCC2 (sequencing and deletion/duplication); EGFR, EGLN1, HOXB13, KIT, MITF, PDGFRA, POLD1, and POLE (sequencing only); EPCAM and GREM1  (deletion/duplication only). DNA and RNA analyses performed for * genes.   Based on Ms. Limberg's personal and family history of cancer, she meets medical criteria for genetic testing. Despite that she meets criteria, she may still have an out of pocket cost. We discussed that if her out of pocket cost for testing is over $100, the laboratory will call and confirm whether she wants to proceed with testing.  If the out of pocket cost of testing is less than $100 she will be billed by the genetic testing laboratory.   PLAN: After considering the risks, benefits, and limitations, Ms. Evelene Croon provided informed consent to pursue genetic testing and the blood sample was sent to [facility] for analysis of the CancerNext-Expanded+RNAinsight. Results should be available within approximately 2-3 weeks' time, at which point they will be disclosed by telephone to Ms. Decarli, as will any additional recommendations warranted by these results. Ms. Haggar will receive a summary of her genetic counseling visit and a copy of her results once available. This information will also be available in Epic.   Lastly, we encouraged Ms. Loughmiller to remain in contact with cancer genetics annually so that we can continuously update the family history and inform her of any changes in cancer genetics and testing that may be of benefit for this family.   Ms. Bertagnolli questions were answered to her satisfaction today. Our contact information was provided should additional questions or concerns arise. Thank you for the referral and allowing [place] to share in the care of your patient.   Isadore Palecek P. Florene [place], [place], [address] Licensed, Insurance risk surveyor [email or web address] phone: [phone]  The patient was seen for a total of 45 minutes in face-to-face genetic counseling.  This patient was discussed with Drs. Magrinat, Lindi Adie and/or Burr Medico who agrees with the above.     _______________________________________________________________________ For Office Staff:  Number of people involved in session: 1 Was an Intern/ student involved with case: no

## 2021-01-29 ENCOUNTER — Other Ambulatory Visit: Payer: Self-pay | Admitting: *Deleted

## 2021-02-03 ENCOUNTER — Ambulatory Visit (HOSPITAL_COMMUNITY)
Admission: RE | Admit: 2021-02-03 | Discharge: 2021-02-03 | Disposition: A | Discharge status: Home / Self Care | Payer: Medicare Other | Source: Ambulatory Visit | Attending: Hematology | Admitting: Hematology

## 2021-02-03 ENCOUNTER — Other Ambulatory Visit: Payer: Self-pay

## 2021-02-03 DIAGNOSIS — I2510 Atherosclerotic heart disease of native coronary artery without angina pectoris: Secondary | ICD-10-CM | POA: Insufficient documentation

## 2021-02-03 DIAGNOSIS — C50912 Malignant neoplasm of unspecified site of left female breast: Secondary | ICD-10-CM | POA: Insufficient documentation

## 2021-02-03 DIAGNOSIS — R918 Other nonspecific abnormal finding of lung field: Secondary | ICD-10-CM | POA: Insufficient documentation

## 2021-02-03 DIAGNOSIS — C50919 Malignant neoplasm of unspecified site of unspecified female breast: Secondary | ICD-10-CM | POA: Diagnosis not present

## 2021-02-03 DIAGNOSIS — I700 Atherosclerosis of aorta: Secondary | ICD-10-CM | POA: Insufficient documentation

## 2021-02-03 LAB — GLUCOSE, CAPILLARY: Glucose-Capillary: 100 mg/dL — ABNORMAL HIGH (ref 70–99)

## 2021-02-03 IMAGING — CT NM PET TUM IMG INITIAL (PI) SKULL BASE T - THIGH
1 of 8 series · 1 of 25 positions shown · non-contrast
Comparison: None.

CLINICAL DATA: Initial treatment strategy for breast cancer.

EXAM:
NUCLEAR MEDICINE PET SKULL BASE TO THIGH
TECHNIQUE: 9.0 mCi F-18 FDG was injected intravenously. Full-ring PET imaging
was performed from the skull base to thigh after the radiotracer. CT
data was obtained and used for attenuation correction and anatomic
localization.
Fasting blood glucose: 100 mg/dl

[Series 4: ct sk_thigh 5.0 bf37 · axial · 5.0mm · 0.98mm/px · 1 of 222 slices shown]
[im 222/222  brain]
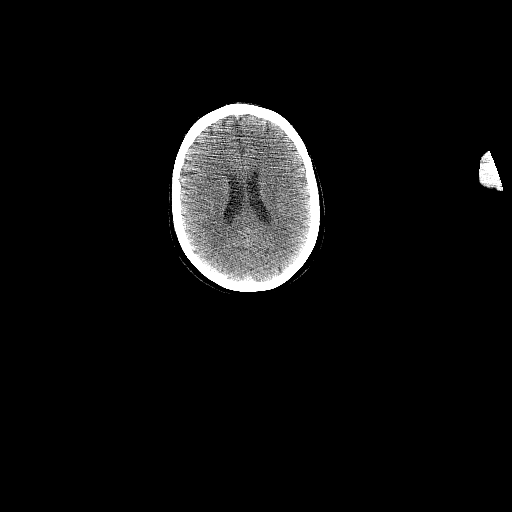

[1 of 25 positions shown; findings below may reference images not displayed]

FINDINGS: Mediastinal blood pool activity: SUV max

Liver activity: SUV max NA

NECK: Line no abnormal hypermetabolism.

Incidental CT findings:

None.

CHEST:

Hypermetabolic left supraclavicular, left subpectoral and left
axillary lymph nodes. Index left axillary lymph node measures 1.8 cm
(4/57) with an SUV max 5.9. No hypermetabolic mediastinal, hilar,
internal mammary or right axillary lymph nodes. Lateral left breast
mass measures approximately 2.2 x 4.7 cm with an SUV max of 4.0. No
hypermetabolic pulmonary nodules.

Incidental CT findings:

Atherosclerotic calcification of the aorta and coronary arteries.
Ascending aorta measures at the upper limits of normal, 3.9 cm.
Heart is enlarged. No pericardial effusion. 10 mm anterior left
lower lobe nodule ([DATE]) has an SUV max of 0.9. 5 mm medial right
upper lobe nodule ([DATE]), too small for PET resolution. Calcified
granulomas. Clustered peribronchovascular nodularity in the
posteromedial left lower lobe, likely postinfectious in etiology. No
pleural fluid.

ABDOMEN/PELVIS:

No abnormal hypermetabolism in the liver, adrenal glands, spleen or
pancreas. No hypermetabolic lymph nodes.

Incidental CT findings:

Liver, gallbladder, adrenal glands, kidneys, spleen, pancreas,
stomach and bowel are unremarkable. Atherosclerotic calcification of
the aorta.

SKELETON:

Focal hypermetabolism is seen in the central spinal canal, at the
T12 level. No hypermetabolic osseous lesions.

Incidental CT findings:

Degenerative changes in the spine.
IMPRESSION: 1. Hypermetabolic left breast mass with metastatic hypermetabolic
adenopathy in the left axillary, left subpectoral and left
supraclavicular regions.
2. Hypometabolic 10 mm left lower lobe nodule, possibly metastatic.
Smaller 5 mm medial right upper lobe nodule is too small for PET
resolution.
3. Focal hypermetabolism within the central spinal canal at T12,
indeterminate. Please correlate clinically.
4. No additional evidence of distant metastatic disease.
5. Aortic atherosclerosis ([E4]-[E4]). Coronary artery
calcification.

## 2021-02-03 MED ORDER — FLUDEOXYGLUCOSE F - 18 (FDG) INJECTION
9.3000 | Freq: Once | INTRAVENOUS | Status: AC | PRN
  Administered 2021-02-03: 9.01 via INTRAVENOUS

## 2021-02-05 NOTE — Progress Notes (Signed)
 [facility] [address] [place], [place] [address]   Patient Care Team: Hasanaj, Xaje A, MD as PCP - General (Internal Medicine) Edwards, Morgan P, RN as Oncology Nurse Navigator (Oncology)  SUMMARY OF ONCOLOGIC HISTORY: Oncology History   No history exists.    CHIEF COMPLIANT: Follow up for left breast cancer   INTERVAL HISTORY: [name] is a 69 y.o. female here today for follow up of her left breast cancer. Her last visit was on 01/23/2021.   She reports feeling good today. She denies any back pain or weakness in her legs. She is scheduled for a Left modified radical mastectomy on 02/17/2021. She has a family gathering during the first week of July that she expresses she does not want to miss. She has not had a recent echocardiogram. She reports abdominal pain that she attributed to her prior colon issues; the pain lasts for an hour and has ben present for the past few years. She reports normal levels of activity. 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 She denies SOB.   REVIEW OF SYSTEMS:   Review of Systems  Constitutional: Negative for appetite change and fatigue.  Respiratory: Negative for shortness of breath.   Musculoskeletal: Negative for back pain.  Neurological: Negative for extremity weakness.  Psychiatric/Behavioral: Positive for sleep disturbance.  All other systems reviewed and are negative.   I have reviewed the past medical history, past surgical history, social history and family history with the patient and they are unchanged from previous note.   ALLERGIES:   is allergic to exforge [amlodipine besylate-valsartan], cheese, strawberry extract, and yeast-related products.   MEDICATIONS:  Current Outpatient Medications  Medication Sig Dispense Refill  . Acetaminophen (TYLENOL PO) Take 1 tablet by mouth as needed.    . albuterol (VENTOLIN HFA) 108 (90 Base) MCG/ACT inhaler  Inhale into the lungs.    . gabapentin (NEURONTIN) 300 MG capsule Take 1 capsule by mouth at bedtime.    . hydrochlorothiazide (MICROZIDE) 12.5 MG capsule Take 12.5 mg by mouth daily.    . levothyroxine (SYNTHROID) 75 MCG tablet Take 100 mcg by mouth daily before breakfast.    . lisinopril (PRINIVIL,ZESTRIL) 40 MG tablet Take 40 mg by mouth daily.    . Melatonin 5 MG TABS Take 5 mg by mouth. As needed for sleep    . METAMUCIL FIBER PO Take by mouth.    . Omega-3 Fatty Acids (FISH OIL PO) Take by mouth.     No current facility-administered medications for this visit.     PHYSICAL EXAMINATION: Performance status (ECOG): 1 - Symptomatic but completely ambulatory  There were no vitals filed for this visit. Wt Readings from Last 3 Encounters:  01/23/21 179 lb 9.6 oz (81.5 kg)  01/13/18 194 lb (88 kg)  01/11/18 190 lb 14.4 oz (86.6 kg)   Physical Exam Vitals reviewed.  Constitutional:      Appearance: Normal appearance.  Cardiovascular:     Rate and Rhythm: Normal rate and regular rhythm.     Pulses: Normal pulses.     Heart sounds: Normal heart sounds.  Pulmonary:     Effort: Pulmonary effort is normal.     Breath sounds: Normal breath sounds.  Neurological:     General: No focal deficit present.     Mental Status: She is alert and oriented to person, place, and time.  Psychiatric:          Mood and Affect: Mood normal.        Behavior: Behavior normal.     Breast Exam Chaperone: Thana Ates     LABORATORY DATA:  I have reviewed the data as listed CMP Latest Ref Rng & Units 01/13/2018  Glucose 65 - 99 mg/dL 97  BUN 6 - 20 mg/dL 20  Creatinine 0.44 - 1.00 mg/dL 0.84  Sodium 135 - 145 mmol/L 138  Potassium 3.5 - 5.1 mmol/L 3.9  Chloride 101 - 111 mmol/L 105  CO2 22 - 32 mmol/L 23  Calcium 8.9 - 10.3 mg/dL 9.1  Total Protein 6.5 - 8.1 g/dL 7.6  Total Bilirubin 0.3 - 1.2 mg/dL 0.5  Alkaline Phos 38 - 126 U/L 71  AST 15 - 41 U/L 17  ALT 14 - 54 U/L 14   No results  found for: [id number] Lab Results  Component Value Date   WBC 5.5 01/13/2018   HGB 13.1 01/13/2018   HCT 40.4 01/13/2018   MCV 95.7 01/13/2018   PLT 313 01/13/2018   NEUTROABS 2.6 01/13/2018    ASSESSMENT:  1.  T3N3c (stage IIIc) triple negative invasive lobular carcinoma of the left breast: - She felt lump in her left breast for more than 6 months, but thought it was secondary to fibrocystic disease which she had all her life.  When she started having pain, she reached out to Dr. Sherrie Sport. - She previously had mammogram machine malfunction and had severe pain and traumatized by that experience.  She was having ultrasound of the breast every other year since then. - She was on hormone replacement therapy for close to 10 years (from age [age]). - Ultrasound of the left breast on 01/08/2021 showed hyper vascular hypoechoic mass at 12 o'clock position measuring 3.8 x 1.3 x 2.5 cm.  There are calcifications located within the mass.  Mass extends to the level of the skin with mild associated skin thickening.  At least 3 morphologically abnormal lymph nodes in the left axilla. - Mammogram showed irregular spiculated mass with associated calcifications in the retroareolar to upper left breast measuring approximately 6 cm.  There are at least 4 morphologically abnormal lymph nodes identified in the left axilla. - Ultrasound-guided left breast and left axillary lymph node biopsy on 01/15/2021 - Pathology consistent with invasive lobular carcinoma, E-cadherin negative.  ER/PR/HER2 negative.  HER2 2+ by IHC, negative by FISH.  Ki-67 is 5%.  Lymph node core biopsy was consistent with metastatic carcinoma.  Grade 2.  2.  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  She is non-smoker. - Mother died of breast cancer.  Maternal grandmother died very young in her 20s, sister has fibrocystic disease.  Father died of lung cancer and was a smoker.   PLAN:  1.  T3N3c  triple negative invasive lobular carcinoma of the left breast: - We have reviewed the PET scan images from 02/03/2021. - There is involvement of left supraclavicular, subpectoral and axillary lymph nodes along with breast mass.  There is a 10 mm left lung nodule which is hypometabolic.  There is also spinal cord lesion at T12 level with a strong uptake. - She does not have any clinical signs or symptoms of leg weakness or new onset back pain. - I have recommended doing an MRI of the thoracic spine with and without contrast. - We will also schedule her for an echocardiogram as baseline. - I will reach out to Dr. Ninfa Linden about  the new PET scan findings. - She is having a family reunion in the first week of July which she does not want to miss at any cost. - I plan to evaluate her after the MRI.  Breast Cancer therapy associated bone loss: I have recommended calcium, Vitamin D and weight bearing exercises.  Orders placed this encounter:  No orders of the defined types were placed in this encounter. Total time spent 30 minutes with more than 50% of the time spent face-to-face discussing scan results, further plan, counseling and coordination of care.  The patient has a good understanding of the overall plan. She agrees with it. She will call with any problems that may develop before the next visit here.  Sreedhar Katragadda, MD [facility] [phone]   I, Kirstyn Evans, am acting as a scribe for Dr. Sreedhar Katragadda.  I, Sreedhar Katragadda MD, have reviewed the above documentation for accuracy and completeness, and I agree with the above.     

## 2021-02-06 ENCOUNTER — Inpatient Hospital Stay (HOSPITAL_BASED_OUTPATIENT_CLINIC_OR_DEPARTMENT_OTHER): Payer: Medicare Other | Admitting: Hematology

## 2021-02-06 ENCOUNTER — Encounter (HOSPITAL_COMMUNITY): Payer: Self-pay | Admitting: Hematology

## 2021-02-06 ENCOUNTER — Encounter (HOSPITAL_BASED_OUTPATIENT_CLINIC_OR_DEPARTMENT_OTHER): Payer: Self-pay | Admitting: Surgery

## 2021-02-06 ENCOUNTER — Other Ambulatory Visit: Payer: Self-pay

## 2021-02-06 VITALS — BP 123/62 | HR 64 | Temp 98.3°F | Resp 18 | Wt 181.0 lb

## 2021-02-06 DIAGNOSIS — Z5111 Encounter for antineoplastic chemotherapy: Secondary | ICD-10-CM

## 2021-02-06 DIAGNOSIS — Z0189 Encounter for other specified special examinations: Secondary | ICD-10-CM

## 2021-02-06 DIAGNOSIS — Z79899 Other long term (current) drug therapy: Secondary | ICD-10-CM | POA: Diagnosis not present

## 2021-02-06 DIAGNOSIS — Z79890 Hormone replacement therapy (postmenopausal): Secondary | ICD-10-CM | POA: Diagnosis not present

## 2021-02-06 DIAGNOSIS — J45909 Unspecified asthma, uncomplicated: Secondary | ICD-10-CM | POA: Diagnosis not present

## 2021-02-06 DIAGNOSIS — C50912 Malignant neoplasm of unspecified site of left female breast: Secondary | ICD-10-CM | POA: Diagnosis not present

## 2021-02-06 DIAGNOSIS — I10 Essential (primary) hypertension: Secondary | ICD-10-CM | POA: Diagnosis not present

## 2021-02-06 DIAGNOSIS — Z803 Family history of malignant neoplasm of breast: Secondary | ICD-10-CM | POA: Diagnosis not present

## 2021-02-06 DIAGNOSIS — Z170 Estrogen receptor positive status [ER+]: Secondary | ICD-10-CM | POA: Diagnosis not present

## 2021-02-06 NOTE — Patient Instructions (Addendum)
[place] at [facility] Discharge Instructions  You were seen today by Dr. Delton Coombes. He went over your recent results and scans. You will be scheduled for an MRI of your thoracic spine and an echocardiogram prior to your next visit. Dr. Delton Coombes will see you back in after your MRI for follow up.   Thank you for choosing [place] at [facility] to provide your oncology and hematology care.  To afford each patient quality time with our provider, please arrive at least 15 minutes before your scheduled appointment time.   If you have a lab appointment with the [facility] please come in thru the Main Entrance and check in at the main information desk  You need to re-schedule your appointment should you arrive 10 or more minutes late.  We strive to give you quality time with our providers, and arriving late affects you and other patients whose appointments are after yours.  Also, if you no show three or more times for appointments you may be dismissed from the clinic at the providers discretion.     Again, thank you for choosing [facility].  Our hope is that these requests will decrease the amount of time that you wait before being seen by our physicians.       _____________________________________________________________  Should you have questions after your visit to [facility], please contact our office at [phone] between the hours of 8:00 a.m. and 4:30 p.m.  Voicemails left after 4:00 p.m. will not be returned until the following business day.  For prescription refill requests, have your pharmacy contact our office and allow 72 hours.    Cancer Center Support Programs:   > Cancer Support Group  2nd Tuesday of the month 1pm-2pm, Journey Room

## 2021-02-11 ENCOUNTER — Other Ambulatory Visit: Payer: Self-pay | Admitting: Surgery

## 2021-02-11 ENCOUNTER — Other Ambulatory Visit: Payer: Self-pay

## 2021-02-11 ENCOUNTER — Encounter (HOSPITAL_BASED_OUTPATIENT_CLINIC_OR_DEPARTMENT_OTHER): Payer: Self-pay | Admitting: Surgery

## 2021-02-12 ENCOUNTER — Other Ambulatory Visit: Payer: Self-pay

## 2021-02-12 ENCOUNTER — Other Ambulatory Visit (HOSPITAL_COMMUNITY)
Admission: RE | Admit: 2021-02-12 | Discharge: 2021-02-12 | Disposition: A | Discharge status: Home / Self Care | Payer: Medicare Other | Source: Ambulatory Visit | Attending: Surgery | Admitting: Surgery

## 2021-02-12 DIAGNOSIS — I10 Essential (primary) hypertension: Secondary | ICD-10-CM | POA: Insufficient documentation

## 2021-02-12 DIAGNOSIS — Z01818 Encounter for other preprocedural examination: Secondary | ICD-10-CM | POA: Insufficient documentation

## 2021-02-12 LAB — BASIC METABOLIC PANEL
Anion gap: 8 (ref 5–15)
BUN: 18 mg/dL (ref 8–23)
CO2: 25 mmol/L (ref 22–32)
Calcium: 9.1 mg/dL (ref 8.9–10.3)
Chloride: 104 mmol/L (ref 98–111)
Creatinine, Ser: 0.88 mg/dL (ref 0.44–1.00)
GFR, Estimated: >60 mL/min (ref >60)
Glucose, Bld: 115 mg/dL — ABNORMAL HIGH (ref 70–99)
Potassium: 4 mmol/L (ref 3.5–5.1)
Sodium: 137 mmol/L (ref 135–145)

## 2021-02-16 NOTE — H&P (Signed)
Denise [name] Appointment: 01/28/2021 9:50 AM Location: [facility] Patient #: [id number] DOB: 1951/02/17 Widowed / Language: Cleophus Molt / Race: White Female   History of Present Illness (Iesha Summerhill A. Ninfa Linden MD; 01/28/2021 10:13 AM) The patient is a 70 year old female who presents with breast cancer.  Chief complaint: Left breast cancer  This is a 70 year old female referred by Dr. Delton Coombes for evaluation of a recent diagnosis of an invasive lobular carcinoma left breast. She had she felt the left breast mass more than 6 months ago. She has had imaging studies performed showing the large greater than 3 cm mass as well as enlarged lymph nodes. She had biopsies of the mass and lymph node showing a triple negative invasive lobular carcinoma which had a Ki-67 of only 5%. She has no previous history of breast cancer. Genetic testing is pending as well as a PET scan. She denies nipple discharge. She is otherwise without complaints.   Past Surgical History Janeann Forehand, CNA; 01/28/2021 9:44 AM) Appendectomy  Hysterectomy (not due to cancer) - Complete   Diagnostic Studies History Janeann Forehand, CNA; 01/28/2021 9:44 AM) Colonoscopy  1-5 years ago Mammogram  within last year Pap Smear  >5 years ago  Allergies Janeann Forehand, CNA; 01/28/2021 9:44 AM) Exforge *ANTIHYPERTENSIVES*  Allergies Reconciled   Medication History Janeann Forehand, CNA; 01/28/2021 9:45 AM) Euthyrox (75MCG Tablet, Oral) Active. Gabapentin (300MG Capsule, Oral) Active. hydroCHLOROthiazide (12.5MG Capsule, Oral) Active. Lisinopril (40MG Tablet, Oral) Active. Medications Reconciled  Social History Janeann Forehand, CNA; 01/28/2021 9:44 AM) Caffeine use  Coffee, Tea. No alcohol use  No drug use  Tobacco use  Never smoker.  Family History Janeann Forehand, CNA; 01/28/2021 9:44 AM) Breast Cancer  Mother. Cancer  Father. Heart Disease  Mother. Hypertension   Mother. Respiratory Condition  Father. Thyroid problems  Daughter, Mother, Sister.  Pregnancy / Birth History Janeann Forehand, CNA; 01/28/2021 9:44 AM) Age at menarche  40 years. Age of menopause  74-48 Contraceptive History  Oral contraceptives. Gravida  3 Length (months) of breastfeeding  3-6 Maternal age  63-25 Para  12  Other Problems Janeann Forehand, CNA; 01/28/2021 9:44 AM) Asthma  Breast Cancer  General anesthesia - complications  Hemorrhoids  High blood pressure  Lump In Breast  Thyroid Disease     Review of Systems Adriana Reams Alston CNA; 01/28/2021 9:44 AM) General Not Present- Appetite Loss, Chills, Fatigue, Fever, Night Sweats, Weight Gain and Weight Loss. Skin Not Present- Change in Wart/Mole, Dryness, Hives, Jaundice, New Lesions, Non-Healing Wounds, Rash and Ulcer. HEENT Not Present- Earache, Hearing Loss, Hoarseness, Nose Bleed, Oral Ulcers, Ringing in the Ears, Seasonal Allergies, Sinus Pain, Sore Throat, Visual Disturbances, Wears glasses/contact lenses and Yellow Eyes. Respiratory Not Present- Bloody sputum, Chronic Cough, Difficulty Breathing, Snoring and Wheezing. Breast Present- Breast Mass. Not Present- Breast Pain, Nipple Discharge and Skin Changes. Cardiovascular Not Present- Chest Pain, Difficulty Breathing Lying Down, Leg Cramps, Palpitations, Rapid Heart Rate, Shortness of Breath and Swelling of Extremities. Gastrointestinal Not Present- Abdominal Pain, Bloating, Bloody Stool, Change in Bowel Habits, Chronic diarrhea, Constipation, Difficulty Swallowing, Excessive gas, Gets full quickly at meals, Hemorrhoids, Indigestion, Nausea, Rectal Pain and Vomiting. Female Genitourinary Not Present- Frequency, Nocturia, Painful Urination, Pelvic Pain and Urgency. Musculoskeletal Not Present- Back Pain, Joint Pain, Joint Stiffness, Muscle Pain, Muscle Weakness and Swelling of Extremities. Neurological Not Present- Decreased Memory, Fainting, Headaches,  Numbness, Seizures, Tingling, Tremor, Trouble walking and Weakness. Psychiatric Not Present- Anxiety, Bipolar, Change in Sleep Pattern, Depression, Fearful and Frequent crying. Endocrine  Not Present- Cold Intolerance, Excessive Hunger, Hair Changes, Heat Intolerance, Hot flashes and New Diabetes. Hematology Not Present- Blood Thinners, Easy Bruising, Excessive bleeding, Gland problems, HIV and Persistent Infections.  Vitals Adriana Reams Alston CNA; 01/28/2021 9:45 AM) 01/28/2021 9:45 AM Weight: 177.13 lb Height: 63in Body Surface Area: 1.84 m Body Mass Index: 31.38 kg/m  Temp.: 98.57F  Pulse: 83 (Regular)  P.OX: 94% (Room air) BP: 140/96(Sitting, Left Arm, Standard)       Physical Exam (Jaxx Huish A. Ninfa Linden MD; 01/28/2021 10:13 AM) The physical exam findings are as follows: Note: She appears well and comfortable exam  There is a large, at least 6 cm mass at the 12 o'clock position of the left breast above the nipple. She has multiple other well-healed scars. There is palpable adenopathy which is moderate in size in the left axilla. There is no arm swelling. There is no cervical or supraclavicular adenopathy. There are no right breast masses.    Assessment & Plan (Vilma Will A. Ninfa Linden MD; 01/28/2021 10:16 AM) LOBULAR CARCINOMA OF LEFT BREAST, STAGE 3 (C50.912) Impression: I reviewed her notes in the electronic medical records as well as the imaging and pathology results and notes from her medical oncologist. She has a stage IIIa nodal positive lobular carcinoma left breast. I long discussion with her regarding breast cancer. I again explained the diagnosis to her in detail. We had a long discussion regarding breast conservation versus mastectomy. In order to achieve breast conservation, she would require neoadjuvant therapy with IV chemotherapy. I agree with medical oncology that with a Ki-67 of only 5% that we may not see any significant reduction in size. We then discussed  proceeding with a left breast modified radical mastectomy. She elected to go and proceed with surgery as soon as possible and agrees to a mastectomy. We again discussed the reasons for this. I discussed the surgical procedure in detail. We discussed risks which includes but is not limited to bleeding, infection, the need for drains, the need for further procedures, injury to surrounding structures, chronic arm swelling, etc. I would also insert a Port-A-Cath at the same time. We discussed the risks of Port-A-Cath insertion which includes but is not limited to bleeding, infection, pneumothorax, injury to surrounding structures, etc. Again, genetics and PET scan are pending. Her surgery will be scheduled as soon as possible.  Addendum: Her MRI was worrisome for metastatic disease outside of the breast and axilla. After further discussion, she needs neoadjuvant therapy first and then potential mastectomy if her metastatic disease is under control.  We will thus proceed only with Port-A-Cath insertion at this time. We again discussed the risks as mentioned above.

## 2021-02-17 ENCOUNTER — Ambulatory Visit (HOSPITAL_BASED_OUTPATIENT_CLINIC_OR_DEPARTMENT_OTHER): Payer: Medicare Other | Admitting: Anesthesiology

## 2021-02-17 ENCOUNTER — Ambulatory Visit (HOSPITAL_COMMUNITY): Payer: Medicare Other

## 2021-02-17 ENCOUNTER — Other Ambulatory Visit: Payer: Self-pay

## 2021-02-17 ENCOUNTER — Ambulatory Visit (HOSPITAL_BASED_OUTPATIENT_CLINIC_OR_DEPARTMENT_OTHER)
Admission: RE | Admit: 2021-02-17 | Discharge: 2021-02-17 | Disposition: A | Discharge status: Home / Self Care | Payer: Medicare Other | Attending: Surgery | Admitting: Surgery

## 2021-02-17 ENCOUNTER — Encounter (HOSPITAL_BASED_OUTPATIENT_CLINIC_OR_DEPARTMENT_OTHER)
Admission: RE | Disposition: A | Discharge status: Home / Self Care | Payer: Self-pay | Source: Home / Self Care | Attending: Surgery

## 2021-02-17 ENCOUNTER — Encounter (HOSPITAL_BASED_OUTPATIENT_CLINIC_OR_DEPARTMENT_OTHER): Payer: Self-pay | Admitting: Surgery

## 2021-02-17 DIAGNOSIS — Z809 Family history of malignant neoplasm, unspecified: Secondary | ICD-10-CM | POA: Diagnosis not present

## 2021-02-17 DIAGNOSIS — E039 Hypothyroidism, unspecified: Secondary | ICD-10-CM | POA: Diagnosis not present

## 2021-02-17 DIAGNOSIS — Z888 Allergy status to other drugs, medicaments and biological substances status: Secondary | ICD-10-CM | POA: Diagnosis not present

## 2021-02-17 DIAGNOSIS — Z836 Family history of other diseases of the respiratory system: Secondary | ICD-10-CM | POA: Insufficient documentation

## 2021-02-17 DIAGNOSIS — I517 Cardiomegaly: Secondary | ICD-10-CM | POA: Diagnosis not present

## 2021-02-17 DIAGNOSIS — I10 Essential (primary) hypertension: Secondary | ICD-10-CM | POA: Diagnosis not present

## 2021-02-17 DIAGNOSIS — Z452 Encounter for adjustment and management of vascular access device: Secondary | ICD-10-CM

## 2021-02-17 DIAGNOSIS — Z803 Family history of malignant neoplasm of breast: Secondary | ICD-10-CM | POA: Insufficient documentation

## 2021-02-17 DIAGNOSIS — Z8349 Family history of other endocrine, nutritional and metabolic diseases: Secondary | ICD-10-CM | POA: Insufficient documentation

## 2021-02-17 DIAGNOSIS — Z8249 Family history of ischemic heart disease and other diseases of the circulatory system: Secondary | ICD-10-CM | POA: Diagnosis not present

## 2021-02-17 DIAGNOSIS — C50912 Malignant neoplasm of unspecified site of left female breast: Secondary | ICD-10-CM | POA: Diagnosis not present

## 2021-02-17 DIAGNOSIS — Z419 Encounter for procedure for purposes other than remedying health state, unspecified: Secondary | ICD-10-CM

## 2021-02-17 DIAGNOSIS — Z171 Estrogen receptor negative status [ER-]: Secondary | ICD-10-CM | POA: Diagnosis not present

## 2021-02-17 HISTORY — DX: Other specified postprocedural states: Z98.890

## 2021-02-17 HISTORY — DX: Other specified postprocedural states: R11.2

## 2021-02-17 HISTORY — PX: PORTACATH PLACEMENT: SHX2246

## 2021-02-17 IMAGING — CR DG CHEST 1V PORT
1 series · 1 of 1 positions shown · non-contrast
Comparison: None.

CLINICAL DATA: Right chest port

EXAM:
PORTABLE CHEST 1 VIEW

[chest ap]
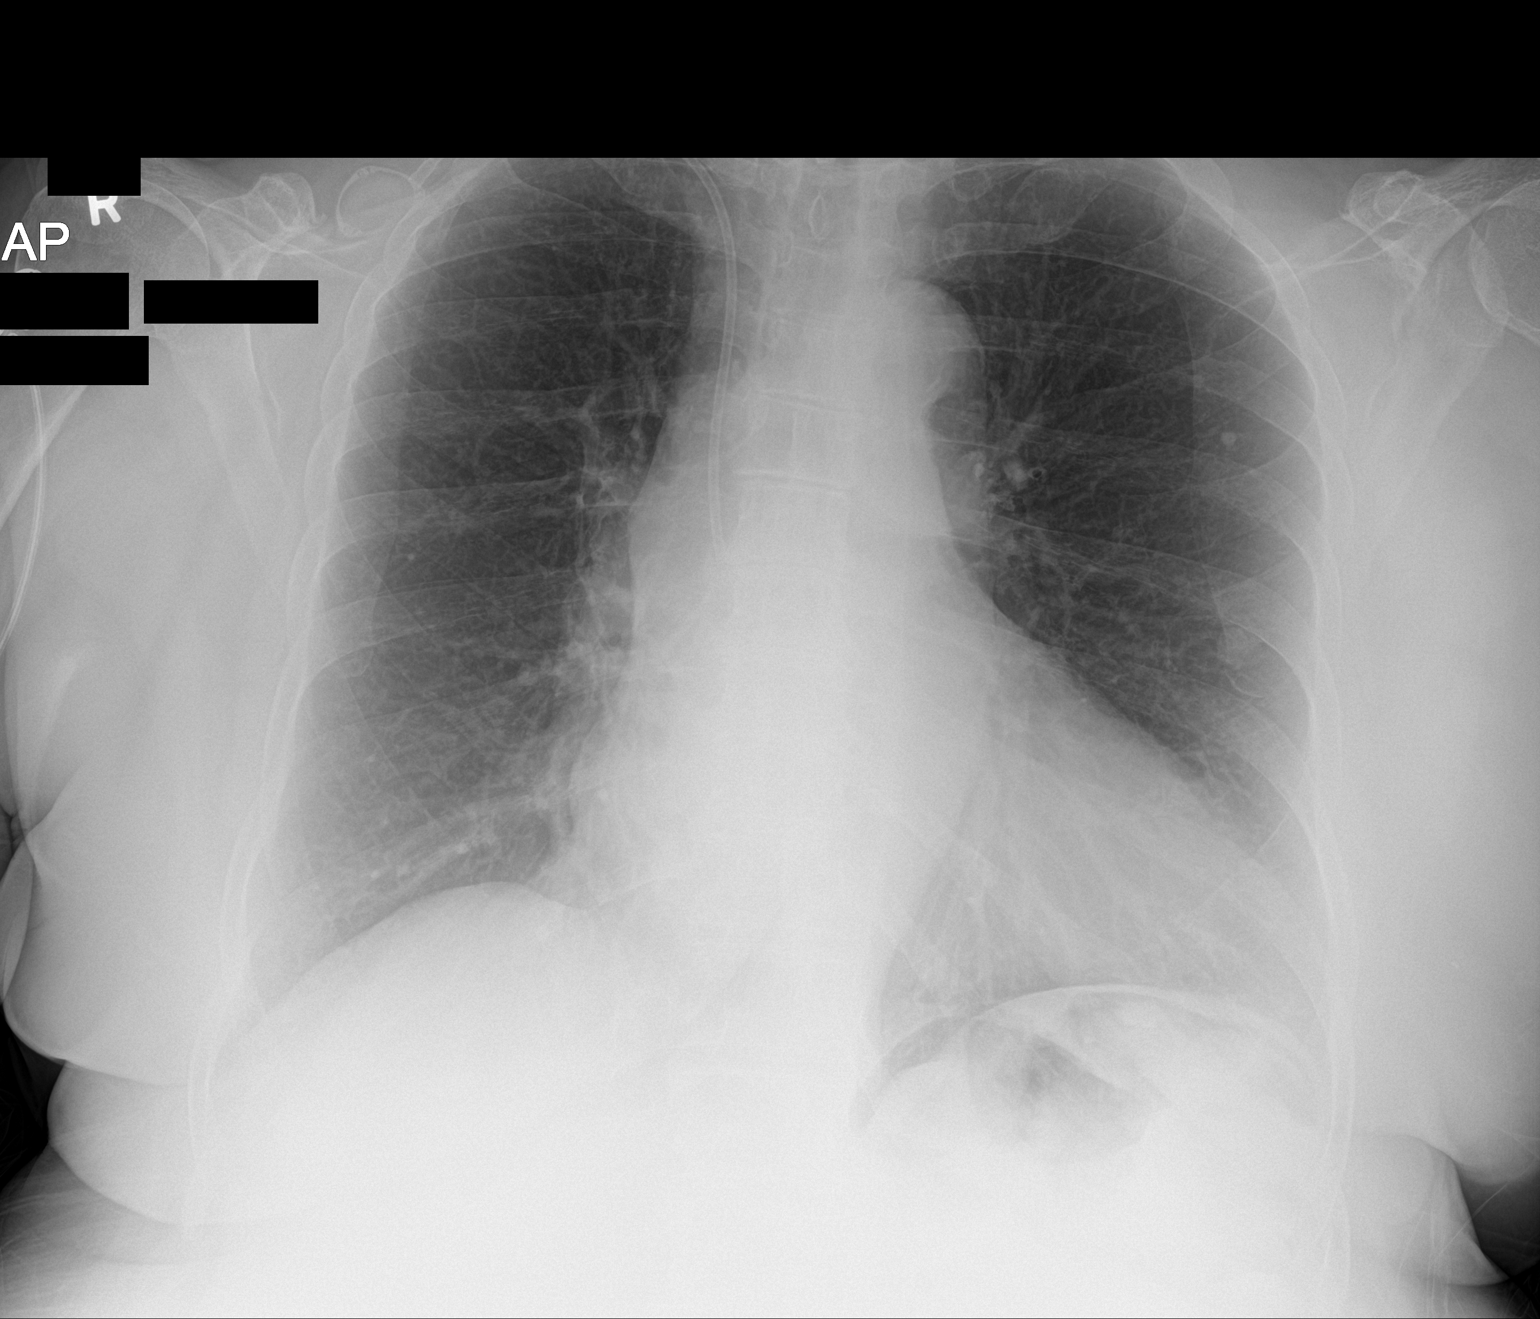

[1 of 1 positions shown; findings below may reference images not displayed]

FINDINGS: Borderline cardiomegaly. There is a right chest port with tip
overlying the distal superior vena cava. There is no focal airspace
disease. No pleural effusion or visible pneumothorax. There is no
acute osseous abnormality.
IMPRESSION: Right chest port tip overlies the distal superior vena cava.

## 2021-02-17 SURGERY — INSERTION, TUNNELED CENTRAL VENOUS DEVICE, WITH PORT
Anesthesia: General | Site: Breast | Laterality: Right

## 2021-02-17 MED ORDER — FENTANYL CITRATE (PF) 100 MCG/2ML IJ SOLN
INTRAMUSCULAR | Status: DC | PRN
  Administered 2021-02-17: 50 ug via INTRAVENOUS

## 2021-02-17 MED ORDER — BUPIVACAINE-EPINEPHRINE 0.5% -1:200000 IJ SOLN
INTRAMUSCULAR | Status: DC | PRN
  Administered 2021-02-17: 9 mL

## 2021-02-17 MED ORDER — ONDANSETRON HCL 4 MG/2ML IJ SOLN: 4.0000 mg | Freq: Once | INTRAMUSCULAR | Status: DC | PRN

## 2021-02-17 MED ORDER — ONDANSETRON HCL 4 MG/2ML IJ SOLN
INTRAMUSCULAR | Status: DC | PRN
  Administered 2021-02-17: 4 mg via INTRAVENOUS

## 2021-02-17 MED ORDER — LACTATED RINGERS IV SOLN: INTRAVENOUS | Status: DC

## 2021-02-17 MED ORDER — TRAMADOL HCL 50 MG PO TABS: 50.0000 mg | ORAL_TABLET | Freq: Four times a day (QID) | ORAL | 0 refills | Status: DC | PRN

## 2021-02-17 MED ORDER — CHLORHEXIDINE GLUCONATE CLOTH 2 % EX PADS: 6.0000 | MEDICATED_PAD | Freq: Once | CUTANEOUS | Status: DC

## 2021-02-17 MED ORDER — PROPOFOL 10 MG/ML IV BOLUS
INTRAVENOUS | Status: DC | PRN
  Administered 2021-02-17: 150 mg via INTRAVENOUS

## 2021-02-17 MED ORDER — LIDOCAINE HCL (CARDIAC) PF 100 MG/5ML IV SOSY
PREFILLED_SYRINGE | INTRAVENOUS | Status: DC | PRN
  Administered 2021-02-17: 50 mg via INTRATRACHEAL

## 2021-02-17 MED ORDER — PROPOFOL 10 MG/ML IV BOLUS
INTRAVENOUS | Status: AC
  Filled 2021-02-17: qty 20

## 2021-02-17 MED ORDER — FENTANYL CITRATE (PF) 100 MCG/2ML IJ SOLN
INTRAMUSCULAR | Status: AC
  Filled 2021-02-17: qty 2

## 2021-02-17 MED ORDER — HEPARIN SOD (PORK) LOCK FLUSH 100 UNIT/ML IV SOLN
INTRAVENOUS | Status: DC | PRN
  Administered 2021-02-17: 500 [IU] via INTRAVENOUS

## 2021-02-17 MED ORDER — ENSURE PRE-SURGERY PO LIQD
296.0000 mL | Freq: Once | ORAL | Status: DC
Start: 2021-02-18 — End: 2021-02-17

## 2021-02-17 MED ORDER — HEPARIN (PORCINE) IN NACL 1000-0.9 UT/500ML-% IV SOLN
INTRAVENOUS | Status: AC
  Filled 2021-02-17: qty 500

## 2021-02-17 MED ORDER — DEXAMETHASONE SODIUM PHOSPHATE 10 MG/ML IJ SOLN
INTRAMUSCULAR | Status: AC
  Filled 2021-02-17: qty 1

## 2021-02-17 MED ORDER — ACETAMINOPHEN 500 MG PO TABS
ORAL_TABLET | ORAL | Status: AC
  Filled 2021-02-17: qty 2

## 2021-02-17 MED ORDER — FENTANYL CITRATE (PF) 100 MCG/2ML IJ SOLN: 25.0000 ug | INTRAMUSCULAR | Status: DC | PRN

## 2021-02-17 MED ORDER — EPHEDRINE SULFATE 50 MG/ML IJ SOLN
INTRAMUSCULAR | Status: DC | PRN
  Administered 2021-02-17: 10 mg via INTRAVENOUS

## 2021-02-17 MED ORDER — DEXAMETHASONE SODIUM PHOSPHATE 10 MG/ML IJ SOLN
INTRAMUSCULAR | Status: DC | PRN
  Administered 2021-02-17: 5 mg via INTRAVENOUS

## 2021-02-17 MED ORDER — LIDOCAINE HCL (PF) 2 % IJ SOLN
INTRAMUSCULAR | Status: AC
  Filled 2021-02-17: qty 5

## 2021-02-17 MED ORDER — HEPARIN (PORCINE) IN NACL 2-0.9 UNITS/ML
INTRAMUSCULAR | Status: AC | PRN
  Administered 2021-02-17: 500 mL via INTRAVENOUS

## 2021-02-17 MED ORDER — ONDANSETRON HCL 4 MG/2ML IJ SOLN
INTRAMUSCULAR | Status: AC
  Filled 2021-02-17: qty 2

## 2021-02-17 MED ORDER — CEFAZOLIN SODIUM-DEXTROSE 2-4 GM/100ML-% IV SOLN
2.0000 g | INTRAVENOUS | Status: AC
  Administered 2021-02-17: 2 g via INTRAVENOUS

## 2021-02-17 MED ORDER — HEPARIN SOD (PORK) LOCK FLUSH 100 UNIT/ML IV SOLN
INTRAVENOUS | Status: AC
  Filled 2021-02-17: qty 5

## 2021-02-17 MED ORDER — CEFAZOLIN SODIUM-DEXTROSE 2-4 GM/100ML-% IV SOLN
INTRAVENOUS | Status: AC
  Filled 2021-02-17: qty 100

## 2021-02-17 MED ORDER — ACETAMINOPHEN 500 MG PO TABS
1000.0000 mg | ORAL_TABLET | ORAL | Status: AC
  Administered 2021-02-17: 1000 mg via ORAL

## 2021-02-17 SURGICAL SUPPLY — 39 items
ADH SKN CLS APL DERMABOND .7 (GAUZE/BANDAGES/DRESSINGS) ×1
APL PRP STRL LF DISP 70% ISPRP (MISCELLANEOUS) ×1
BAG DECANTER FOR FLEXI CONT (MISCELLANEOUS) IMPLANT
BLADE SURG 15 STRL LF DISP TIS (BLADE) ×1 IMPLANT
BLADE SURG 15 STRL SS (BLADE) ×2
CANISTER SUCT 1200ML W/VALVE (MISCELLANEOUS) IMPLANT
CHLORAPREP W/TINT 26 (MISCELLANEOUS) ×2 IMPLANT
COVER BACK TABLE 60X90IN (DRAPES) ×2 IMPLANT
COVER MAYO STAND STRL (DRAPES) ×2 IMPLANT
COVER WAND RF STERILE (DRAPES) IMPLANT
DECANTER SPIKE VIAL GLASS SM (MISCELLANEOUS) IMPLANT
DERMABOND ADVANCED (GAUZE/BANDAGES/DRESSINGS) ×1
DERMABOND ADVANCED .7 DNX12 (GAUZE/BANDAGES/DRESSINGS) ×1 IMPLANT
DRAPE C-ARM 42X72 X-RAY (DRAPES) ×2 IMPLANT
DRAPE LAPAROSCOPIC ABDOMINAL (DRAPES) ×2 IMPLANT
DRAPE UTILITY XL STRL (DRAPES) ×2 IMPLANT
ELECT REM PT RETURN 9FT ADLT (ELECTROSURGICAL) ×2
ELECTRODE REM PT RTRN 9FT ADLT (ELECTROSURGICAL) ×1 IMPLANT
GLOVE SURG SIGNA 7.5 PF LTX (GLOVE) ×4 IMPLANT
GOWN STRL REUS W/ TWL LRG LVL3 (GOWN DISPOSABLE) ×1 IMPLANT
GOWN STRL REUS W/ TWL XL LVL3 (GOWN DISPOSABLE) ×2 IMPLANT
GOWN STRL REUS W/TWL LRG LVL3 (GOWN DISPOSABLE) ×2
GOWN STRL REUS W/TWL XL LVL3 (GOWN DISPOSABLE) ×4
IV KIT MINILOC 20X1 SAFETY (NEEDLE) IMPLANT
KIT PORT POWER 8FR ISP CVUE (Port) ×2 IMPLANT
NEEDLE HYPO 25X1 1.5 SAFETY (NEEDLE) ×2 IMPLANT
PACK BASIN DAY SURGERY FS (CUSTOM PROCEDURE TRAY) ×2 IMPLANT
PENCIL SMOKE EVACUATOR (MISCELLANEOUS) ×2 IMPLANT
SLEEVE SCD COMPRESS KNEE MED (STOCKING) ×2 IMPLANT
SUT MNCRL AB 4-0 PS2 18 (SUTURE) ×2 IMPLANT
SUT PROLENE 2 0 SH DA (SUTURE) ×2 IMPLANT
SUT SILK 2 0 TIES 17X18 (SUTURE)
SUT SILK 2-0 18XBRD TIE BLK (SUTURE) IMPLANT
SUT VIC AB 3-0 SH 27 (SUTURE) ×2
SUT VIC AB 3-0 SH 27X BRD (SUTURE) ×1 IMPLANT
SYR CONTROL 10ML LL (SYRINGE) ×2 IMPLANT
TOWEL GREEN STERILE FF (TOWEL DISPOSABLE) ×2 IMPLANT
TUBE CONNECTING 20X1/4 (TUBING) IMPLANT
YANKAUER SUCT BULB TIP NO VENT (SUCTIONS) IMPLANT

## 2021-02-17 NOTE — Op Note (Signed)
Preop diagnosis: Left breast invasive lobular carcinoma Postop diagnosis: Same Procedure performed: Right subclavian port placement Surgeon: Coralie Keens, MD Assistant: Mosie Lukes, MD Anesthesia: General via LMA Indications: 70 y/o F hx left breast invasive lobular carcinoma with evidence of metastatic disease. The patient presents today for port-a-cath placement for administration of systemic chemotherapy.   Description of procedure: The patient is brought to the operating room placed in the supine position on the operating table.  After an adequate level of general anesthesia was obtained, the patient's right arm was tucked at her side. Her right chest was prepped with ChloraPrep and draped sterile fashion. A timeout was taken to ensure the proper patient and proper procedure. She was placed in Trendelenburg position. We directly cannulated the right subclavian vein beneath the clavicle with good blood return. The wire passed easily. Fluoroscopy confirmed that the wire headed down the right side of the mediastinum. The needle was removed. We created a subcutaneous pocket below the right clavicle around the wire exit site. We first anesthetized with local anesthetic. An 8 French [place] port was assembled and was placed in the subcutaneous pocket. The catheter was cut to the appropriate length using fluoroscopic guidance. Using fluoroscopic guidance, we passed the dilator and breakaway sheath over the wire. The wire and dilator were removed. The catheter was then advanced through the sheath which was removed. Fluoroscopy confirmed that there were no kinks along the length of the catheter. We are able to aspirate blood easily through the port and were able to flush easily. The port was secured with two interrupted 2-0 Prolene sutures. 3-0 Vicryl was used to close the subcutaneous tissue and 4-0 Monocryl was used to close the skin at both sites. Skin glue was applied. The patient was then extubated  and brought to the recovery room in stable condition.  All sponge, instrument, and needle counts are correct.  Post-op chest x-ray is pending.

## 2021-02-17 NOTE — Interval H&P Note (Signed)
History and Physical Interval Note: no change in H and P  02/17/2021 11:51 AM  Denise [name]  has presented today for surgery, with the diagnosis of LEFT BREAST CANCER.  The various methods of treatment have been discussed with the patient and family. After consideration of risks, benefits and other options for treatment, the patient has consented to  Procedure(s): INSERTION PORT-A-CATH (N/A) as a surgical intervention.  The patient's history has been reviewed, patient examined, no change in status, stable for surgery.  I have reviewed the patient's chart and labs.  Questions were answered to the patient's satisfaction.     Coralie Keens

## 2021-02-17 NOTE — Anesthesia Preprocedure Evaluation (Addendum)
Anesthesia Evaluation  Patient identified by MRN, date of birth, ID band Patient awake    Reviewed: Allergy & Precautions, NPO status , Patient's Chart, lab work & pertinent test results, reviewed documented beta blocker date and time   History of Anesthesia Complications (+) PONV and history of anesthetic complications  Airway Mallampati: I  TM Distance: >3 FB Neck ROM: Full    Dental  (+) Caps, Dental Advisory Given, Upper Dentures, Missing,    Pulmonary neg pulmonary ROS,    Pulmonary exam normal breath sounds clear to auscultation       Cardiovascular hypertension, Pt. on medications Normal cardiovascular exam Rhythm:Regular Rate:Normal     Neuro/Psych negative neurological ROS  negative psych ROS   GI/Hepatic negative GI ROS, Neg liver ROS,   Endo/Other  Hypothyroidism Left breast Ca  Renal/GU   negative genitourinary   Musculoskeletal negative musculoskeletal ROS (+)   Abdominal (+) - obese,   Peds  Hematology negative hematology ROS (+)   Anesthesia Other Findings   Reproductive/Obstetrics                            Anesthesia Physical Anesthesia Plan  ASA: II  Anesthesia Plan: General   Post-op Pain Management:    Induction: Intravenous  PONV Risk Score and Plan: 4 or greater and Treatment may vary due to age or medical condition, Ondansetron and Dexamethasone  Airway Management Planned: LMA  Additional Equipment:   Intra-op Plan:   Post-operative Plan: Extubation in OR  Informed Consent: I have reviewed the patients History and Physical, chart, labs and discussed the procedure including the risks, benefits and alternatives for the proposed anesthesia with the patient or authorized representative who has indicated his/her understanding and acceptance.     Dental advisory given  Plan Discussed with: CRNA and Anesthesiologist  Anesthesia Plan Comments:          Anesthesia Quick Evaluation

## 2021-02-17 NOTE — Discharge Instructions (Signed)
Ok to shower starting tomorrow  Ice pack, tylenol, and ibuprofen also for pain  No vigorous activity for one week  May take Tylenol after 6pm, if needed.    Post Anesthesia Home Care Instructions  Activity: Get plenty of rest for the remainder of the day. A responsible individual must stay with you for 24 hours following the procedure.  For the next 24 hours, DO NOT: -Drive a car -Paediatric nurse -Drink alcoholic beverages -Take any medication unless instructed by your physician -Make any legal decisions or sign important papers.  Meals: Start with liquid foods such as gelatin or soup. Progress to regular foods as tolerated. Avoid greasy, spicy, heavy foods. If nausea and/or vomiting occur, drink only clear liquids until the nausea and/or vomiting subsides. Call your physician if vomiting continues.  Special Instructions/Symptoms: Your throat may feel dry or sore from the anesthesia or the breathing tube placed in your throat during surgery. If this causes discomfort, gargle with warm salt water. The discomfort should disappear within 24 hours.  If you had a scopolamine patch placed behind your ear for the management of post- operative nausea and/or vomiting:  1. The medication in the patch is effective for 72 hours, after which it should be removed.  Wrap patch in a tissue and discard in the trash. Wash hands thoroughly with soap and water. 2. You may remove the patch earlier than 72 hours if you experience unpleasant side effects which may include dry mouth, dizziness or visual disturbances. 3. Avoid touching the patch. Wash your hands with soap and water after contact with the patch.

## 2021-02-17 NOTE — Anesthesia Procedure Notes (Signed)
Procedure Name: LMA Insertion Date/Time: 02/17/2021 1:07 PM Performed by: Glory Buff, CRNA Pre-anesthesia Checklist: Patient identified, Emergency Drugs available, Suction available and Patient being monitored Patient Re-evaluated:Patient Re-evaluated prior to induction Oxygen Delivery Method: Circle system utilized Preoxygenation: Pre-oxygenation with 100% oxygen Induction Type: IV induction LMA: LMA inserted LMA Size: 4.0 Number of attempts: 1 Placement Confirmation: positive ETCO2 Tube secured with: Tape Dental Injury: Teeth and Oropharynx as per pre-operative assessment

## 2021-02-17 NOTE — Transfer of Care (Signed)
Immediate Anesthesia Transfer of Care Note  Patient: Letia Guidry  Procedure(s) Performed: INSERTION PORT-A-CATH (Right Breast)  Patient Location: PACU  Anesthesia Type:General  Level of Consciousness: drowsy, patient cooperative and responds to stimulation  Airway & Oxygen Therapy: Patient Spontanous Breathing and Patient connected to face mask oxygen  Post-op Assessment: Report given to RN and Post -op Vital signs reviewed and stable  Post vital signs: Reviewed and stable  Last Vitals:  Vitals Value Taken Time  BP    Temp    Pulse    Resp    SpO2      Last Pain:  Vitals:   02/17/21 1150  TempSrc: Oral  PainSc: 0-No pain      Patients Stated Pain Goal: 4 (51/[id number]/41 [id number])  Complications: No complications documented.

## 2021-02-17 NOTE — Anesthesia Postprocedure Evaluation (Signed)
Anesthesia Post Note  Patient: Denise [name]  Procedure(s) Performed: INSERTION PORT-A-CATH (Right Breast)     Patient location during evaluation: PACU Anesthesia Type: General Level of consciousness: awake and alert and oriented Pain management: pain level controlled Vital Signs Assessment: post-procedure vital signs reviewed and stable Respiratory status: spontaneous breathing, nonlabored ventilation and respiratory function stable Cardiovascular status: blood pressure returned to baseline and stable Postop Assessment: no apparent nausea or vomiting Anesthetic complications: no   No complications documented.  Last Vitals:  Vitals:   02/17/21 1400 02/17/21 1415  BP: 111/79 122/74  Pulse: 63 (!) 56  Resp: (!) 22 15  Temp:    SpO2: 98% 98%    Last Pain:  Vitals:   02/17/21 1415  TempSrc:   PainSc: 0-No pain                 Curtez Brallier A.

## 2021-02-18 ENCOUNTER — Encounter (HOSPITAL_BASED_OUTPATIENT_CLINIC_OR_DEPARTMENT_OTHER): Payer: Self-pay | Admitting: Surgery

## 2021-02-20 ENCOUNTER — Telehealth: Payer: Self-pay | Admitting: Licensed Clinical Social Worker

## 2021-02-20 ENCOUNTER — Ambulatory Visit (HOSPITAL_COMMUNITY)
Admission: RE | Admit: 2021-02-20 | Discharge: 2021-02-20 | Disposition: A | Discharge status: Home / Self Care | Payer: Medicare Other | Source: Ambulatory Visit | Attending: Hematology | Admitting: Hematology

## 2021-02-20 DIAGNOSIS — Z5111 Encounter for antineoplastic chemotherapy: Secondary | ICD-10-CM | POA: Insufficient documentation

## 2021-02-20 DIAGNOSIS — Z803 Family history of malignant neoplasm of breast: Secondary | ICD-10-CM | POA: Insufficient documentation

## 2021-02-20 DIAGNOSIS — M545 Low back pain, unspecified: Secondary | ICD-10-CM | POA: Diagnosis not present

## 2021-02-20 DIAGNOSIS — C50912 Malignant neoplasm of unspecified site of left female breast: Secondary | ICD-10-CM | POA: Insufficient documentation

## 2021-02-20 IMAGING — MR MR LUMBAR SPINE WO/W CM
5 of 7 series · 31 of 48 positions shown · IV contrast (7.5 ml Gadavist)
Comparison: PET-CT [DATE]

CLINICAL DATA: Low back pain. History of breast cancer. Abnormal
PET CT with increased metabolic activity seen in the spinal canal at
the T12 level

EXAM:
MRI LUMBAR SPINE WITHOUT AND WITH CONTRAST
TECHNIQUE: Multiplanar and multiecho pulse sequences of the lumbar spine were
obtained without and with intravenous contrast.
CONTRAST:  7.5mL GADAVIST GADOBUTROL 1 MMOL/ML IV SOLN

[Series 5: T2 · sagittal · 4.0mm · 0.68mm/px · 3 of 15 slices shown (1 of 2)]
[im 1/15]
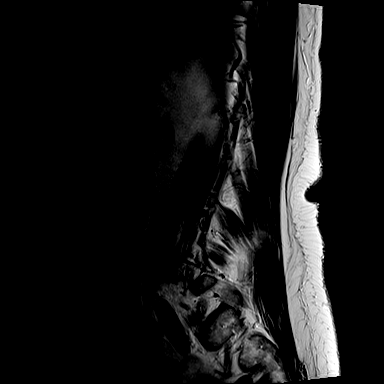
[im 8/15]
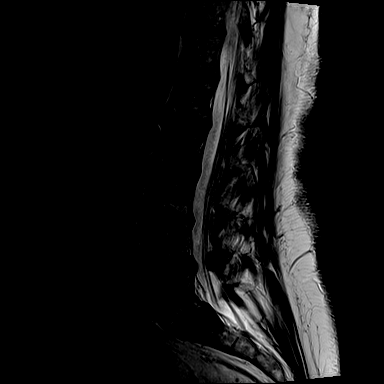
[im 15/15]
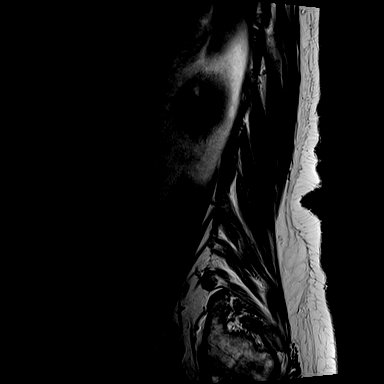

[Series 6: T1 · sagittal · 4.0mm · 0.81mm/px · 4 of 15 slices shown (1 of 2)]
[im 1/15]
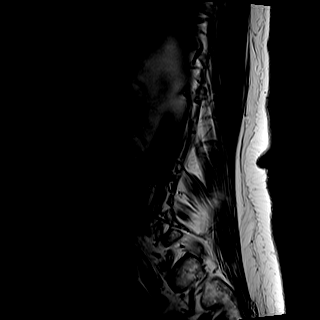
[im 5/15]
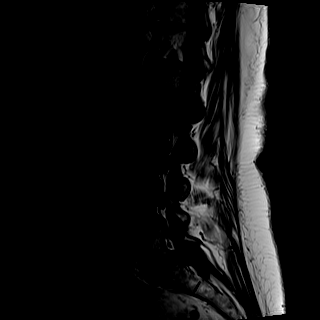
[im 10/15]
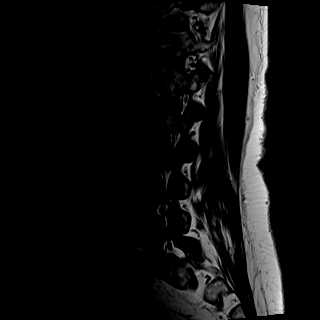
[im 15/15]
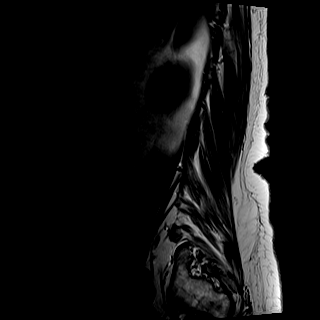

[Series 8: T1 · axial · 4.0mm · 0.35mm/px · z∈[-92,+148]mm · 11 of 43 slices shown (2 of 2)]
[im 1/43]
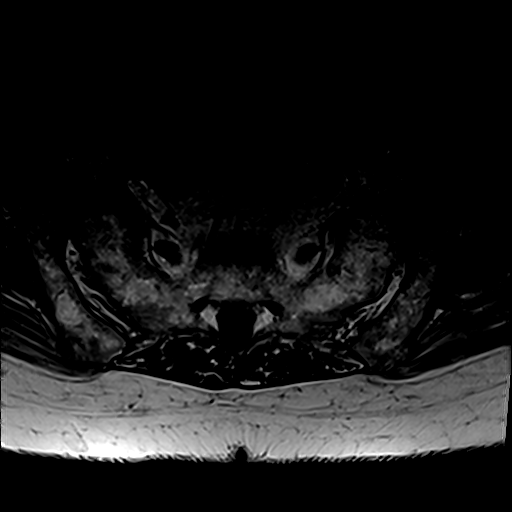
[im 5/43]
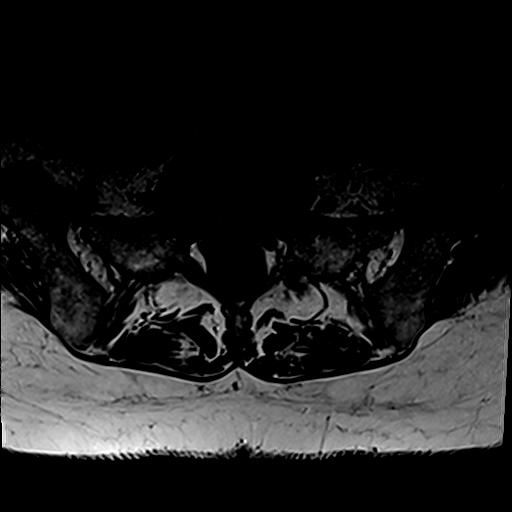
[im 9/43]
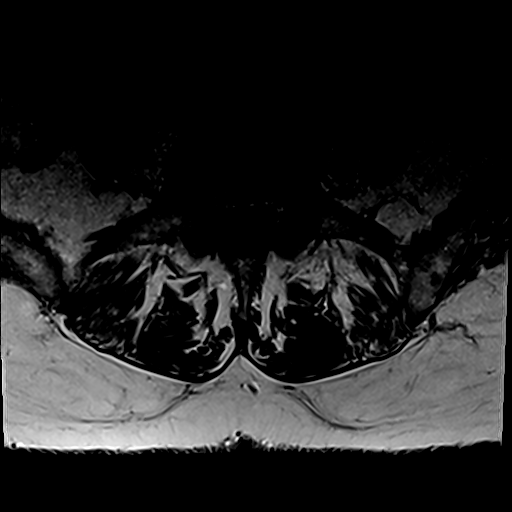
[im 13/43]
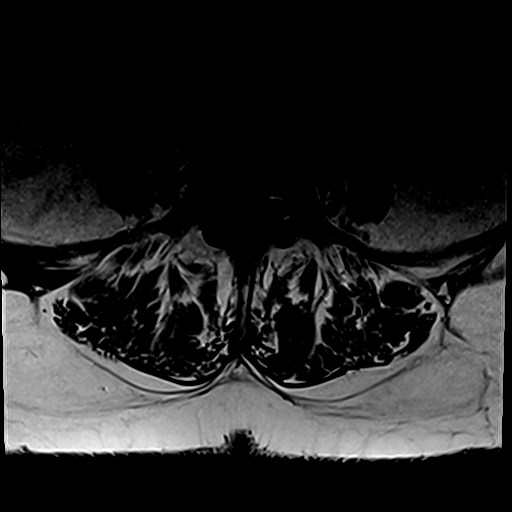
[im 17/43]
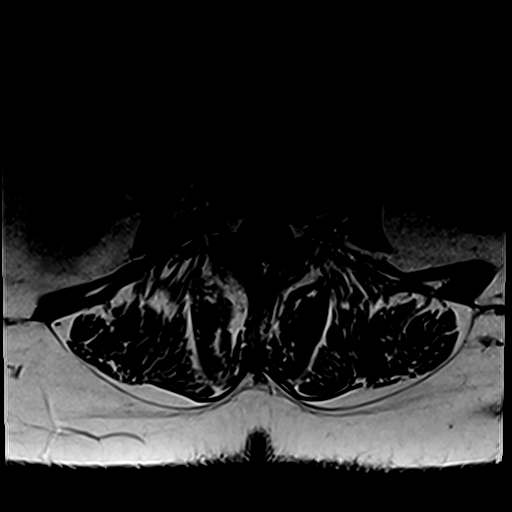
[im 22/43]
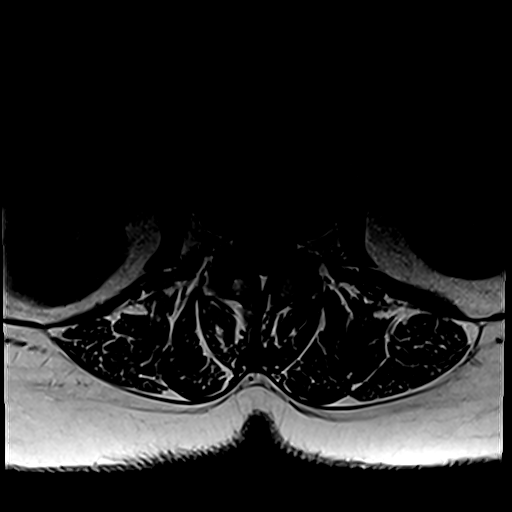
[im 26/43]
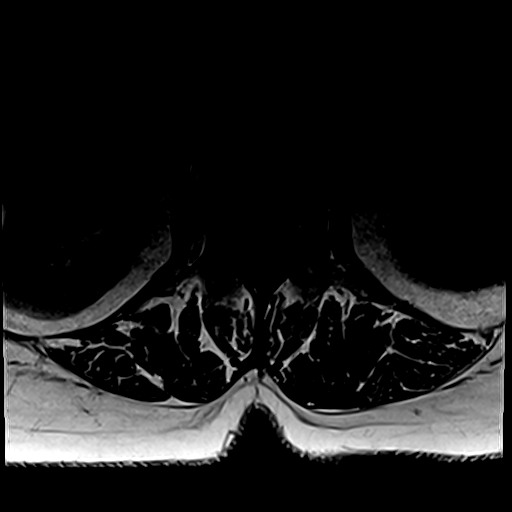
[im 30/43]
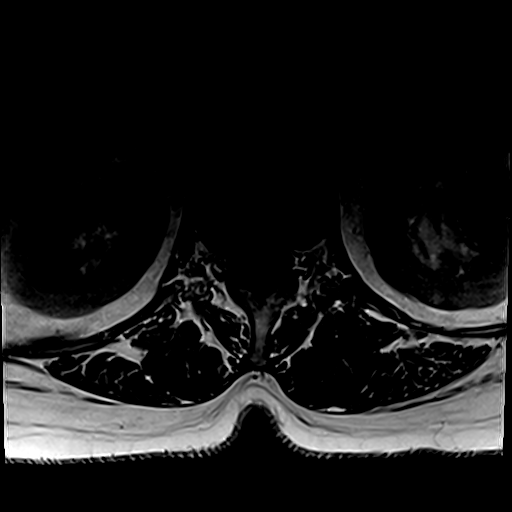
[im 34/43]
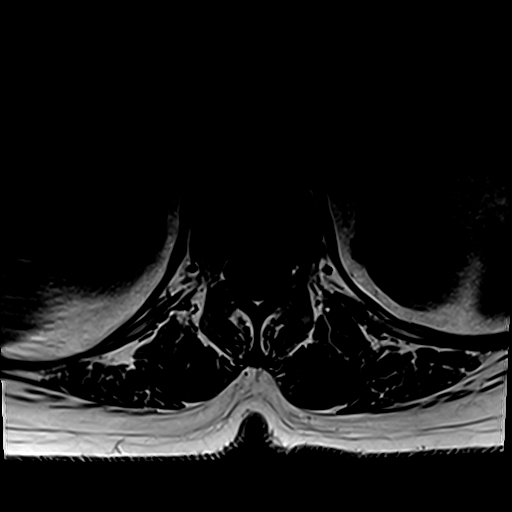
[im 38/43]
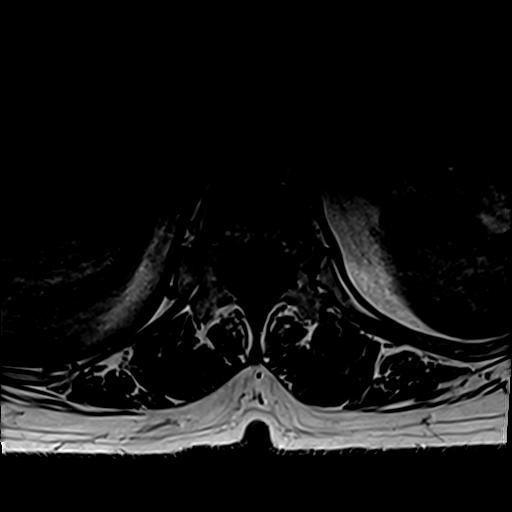
[im 43/43]
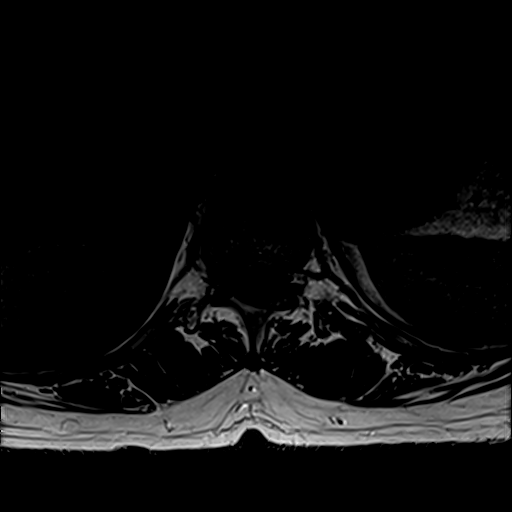

[Series 9: T2 · axial · 4.0mm · 0.70mm/px · z∈[-92,+148]mm · 11 of 43 slices shown (2 of 2)]
[im 1/43]
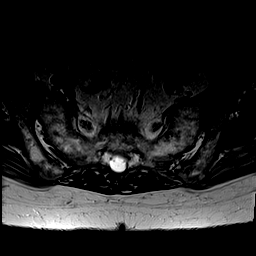
[im 5/43]
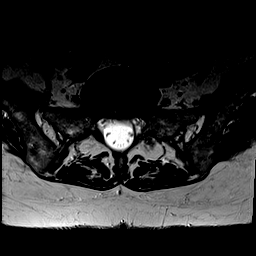
[im 9/43]
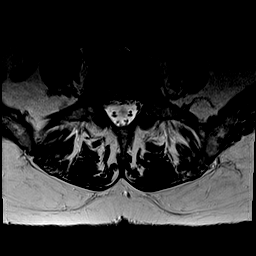
[im 13/43]
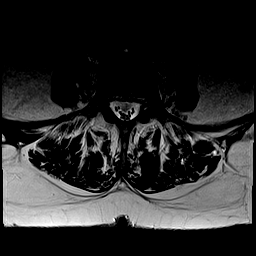
[im 17/43]
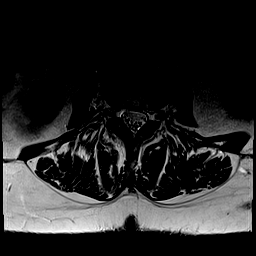
[im 22/43]
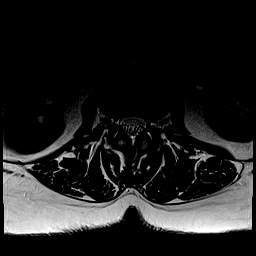
[im 26/43]
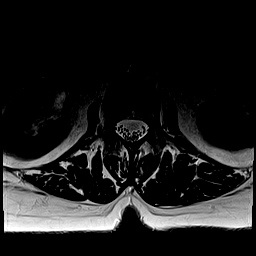
[im 30/43]
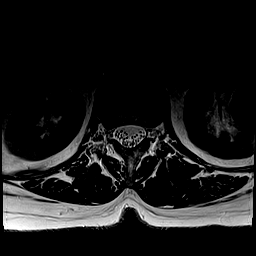
[im 34/43]
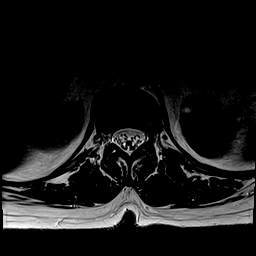
[im 38/43]
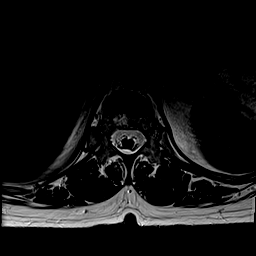
[im 43/43]
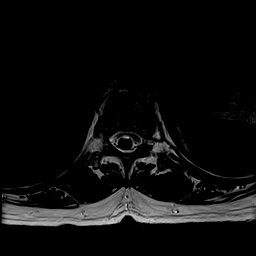

[Series 10: T1 fat-sat post-contrast · sagittal · 4.0mm · 0.81mm/px · 2 of 15 slices shown]
[im 1/15]
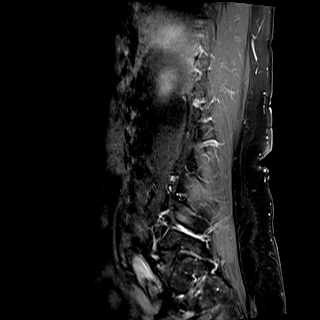
[im 5/15]
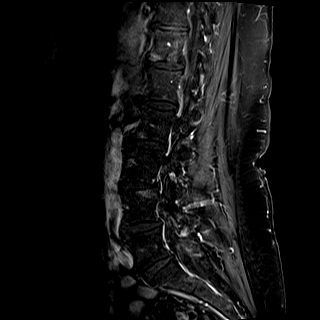

[31 of 48 positions shown; findings below may reference images not displayed]

FINDINGS: Segmentation:  Standard.

Alignment:  Minimal anterolisthesis L4 on L5.

Vertebrae: No fracture, evidence of discitis, or suspicious marrow
replacing bone lesion. Incidental note of a 1.3 cm T1/T2
hyperintense benign intraosseous hemangioma within the T12 vertebral
body. Scattered endplate Schmorl's nodes. No abnormal postcontrast
enhancement.

Conus medullaris and cauda equina: Conus extends to the L1 level.
Conus and cauda equina appear normal. No intradural lesion or
abnormal intradural enhancement.

Paraspinal and other soft tissues: No paraspinal masses.

Disc levels:

T12-L1: No significant disc protrusion, foraminal stenosis, or canal
stenosis.

L1-L2: Minimal circumferential disc bulge. No foraminal or canal
stenosis.

L2-L3: Minimal circumferential disc bulge. No foraminal or canal
stenosis.

L3-L4: Mild circumferential disc bulge. Mild bilateral facet
arthropathy and ligamentum flavum buckling. No significant foraminal
or canal stenosis.

L4-L5: Mild circumferential disc bulge with shallow central disc
protrusion. Mild bilateral facet arthropathy and ligamentum flavum
buckling. There is mild canal stenosis with mild bilateral
subarticular recess stenosis. No significant foraminal stenosis.

L5-S1: Minimal diffuse disc bulge. Left worse than right facet
arthropathy. No foraminal or canal stenosis.
IMPRESSION: 1. No mass or abnormal enhancement within the canal at the T12 level
to correspond to site of focal hypermetabolic activity on recent
PET-CT.
2. No marrow replacing bone lesion within the lumbar spine.
3. Mild multilevel degenerative changes of the lumbar spine as
above, greatest at the L4-5 level where there is mild canal stenosis
and mild bilateral subarticular recess stenosis.

## 2021-02-20 MED ORDER — GADOBUTROL 1 MMOL/ML IV SOLN
7.5000 mL | Freq: Once | INTRAVENOUS | Status: AC | PRN
  Administered 2021-02-20: 7.5 mL via INTRAVENOUS

## 2021-02-20 NOTE — Progress Notes (Signed)
[facility] [address] [place] [address]   Patient Care Team: Neale Burly, MD as PCP - General (Internal Medicine) Brien Mates, RN as Oncology Nurse Navigator (Oncology)  SUMMARY OF ONCOLOGIC HISTORY: Oncology History   No history exists.    CHIEF COMPLIANT: Follow up for left breast cancer   INTERVAL HISTORY: [name] is a 70 y.o. female here today for follow up of her left breast cancer. Her last visit was on 02/06/2021.    Today she reports feeling well. She has a family reunion July 2-9 in [place], [place] she would like to attend if at all possible. She is working full time at a hospital, and she reports excellent levels of activity. She denies a history of autoimmune issues.   REVIEW OF SYSTEMS:   Review of Systems  All other systems reviewed and are negative.  I have reviewed the past medical history, past surgical history, social history and family history with the patient and they are unchanged from previous note.   ALLERGIES:   is allergic to exforge [amlodipine besylate-valsartan], cheese, strawberry extract, and yeast-related products.   MEDICATIONS:  Current Outpatient Medications  Medication Sig Dispense Refill   Acetaminophen (TYLENOL PO) Take 1 tablet by mouth as needed.     albuterol (VENTOLIN HFA) 108 (90 Base) MCG/ACT inhaler Inhale into the lungs.     gabapentin (NEURONTIN) 300 MG capsule Take 1 capsule by mouth at bedtime.     hydrochlorothiazide (MICROZIDE) 12.5 MG capsule Take 12.5 mg by mouth daily.     levothyroxine (SYNTHROID) 75 MCG tablet Take 100 mcg by mouth daily before breakfast.     lisinopril (PRINIVIL,ZESTRIL) 40 MG tablet Take 40 mg by mouth daily.     Melatonin 5 MG TABS Take 5 mg by mouth. As needed for sleep     METAMUCIL FIBER PO Take by mouth.     Omega-3 Fatty Acids (FISH OIL PO) Take by mouth.     traMADol (ULTRAM) 50 MG tablet Take 1 tablet (50 mg total) by mouth every 6 (six) hours  as needed. 20 tablet 0   No current facility-administered medications for this visit.     PHYSICAL EXAMINATION: Performance status (ECOG): 1 - Symptomatic but completely ambulatory  There were no vitals filed for this visit. Wt Readings from Last 3 Encounters:  02/17/21 173 lb 15.1 oz (78.9 kg)  02/06/21 181 lb (82.1 kg)  01/23/21 179 lb 9.6 oz (81.5 kg)   Physical Exam Vitals reviewed.  Constitutional:      Appearance: Normal appearance.  Cardiovascular:     Rate and Rhythm: Normal rate and regular rhythm.     Pulses: Normal pulses.     Heart sounds: Normal heart sounds.  Pulmonary:     Effort: Pulmonary effort is normal.     Breath sounds: Normal breath sounds.  Chest:  Breasts:    Right: No inverted nipple, mass, nipple discharge, tenderness, axillary adenopathy or supraclavicular adenopathy.     Left: Mass (6 cm tumor above nipple-aerolar complex; freely mobile) present. No inverted nipple, nipple discharge, tenderness, axillary adenopathy or supraclavicular adenopathy.  Lymphadenopathy:     Upper Body:     Right upper body: No supraclavicular, axillary or pectoral adenopathy.     Left upper body: No supraclavicular, axillary or pectoral adenopathy.  Neurological:     General: No focal deficit present.     Mental Status: She is alert and oriented to person, place,  and time.  Psychiatric:        Mood and Affect: Mood normal.        Behavior: Behavior normal.    Breast Exam Chaperone: Thana Ates     LABORATORY DATA:  I have reviewed the data as listed CMP Latest Ref Rng & Units 02/12/2021 01/13/2018  Glucose 70 - 99 mg/dL 115(H) 97  BUN 8 - 23 mg/dL 18 20  Creatinine 0.44 - 1.00 mg/dL 0.88 0.84  Sodium 135 - 145 mmol/L 137 138  Potassium 3.5 - 5.1 mmol/L 4.0 3.9  Chloride 98 - 111 mmol/L 104 105  CO2 22 - 32 mmol/L 25 23  Calcium 8.9 - 10.3 mg/dL 9.1 9.1  Total Protein 6.5 - 8.1 g/dL - 7.6  Total Bilirubin 0.3 - 1.2 mg/dL - 0.5  Alkaline Phos 38 - 126 U/L -  71  AST 15 - 41 U/L - 17  ALT 14 - 54 U/L - 14   No results found for: [id number] Lab Results  Component Value Date   WBC 5.5 01/13/2018   HGB 13.1 01/13/2018   HCT 40.4 01/13/2018   MCV 95.7 01/13/2018   PLT 313 01/13/2018   NEUTROABS 2.6 01/13/2018    ASSESSMENT:  1.  T3N3c (stage IIIc) triple negative invasive lobular carcinoma of the left breast: - She felt lump in her left breast for more than 6 months, but thought it was secondary to fibrocystic disease which she had all her life.  When she started having pain, she reached out to Dr. Sherrie Sport. - She previously had mammogram machine malfunction and had severe pain and traumatized by that experience.  She was having ultrasound of the breast every other year since then. - She was on hormone replacement therapy for close to 10 years (from age [age]). - Ultrasound of the left breast on 01/08/2021 showed hyper vascular hypoechoic mass at 12 o'clock position measuring 3.8 x 1.3 x 2.5 cm.  There are calcifications located within the mass.  Mass extends to the level of the skin with mild associated skin thickening.  At least 3 morphologically abnormal lymph nodes in the left axilla. - Mammogram showed irregular spiculated mass with associated calcifications in the retroareolar to upper left breast measuring approximately 6 cm.  There are at least 4 morphologically abnormal lymph nodes identified in the left axilla. - Ultrasound-guided left breast and left axillary lymph node biopsy on 01/15/2021 - Pathology consistent with invasive lobular carcinoma, E-cadherin negative.  ER/PR/HER2 negative.  HER2 2+ by IHC, negative by FISH.  Ki-67 is 5%.  Lymph node core biopsy was consistent with metastatic carcinoma.  Grade 2. - PET scan on 02/03/2021 showed involvement of left supraclavicular, subpectoral, axillary lymph nodes along with the breast mass.  10 mm left lung nodule which is hypometabolic.  Spinal cord lesion at T12 level with a strong uptake. - MRI of  the lumbar spine with and without contrast on 02/20/2021 showed no mass or abnormal enhancement within the canal at the T12 level to correspond to the site of PET scan positive.  No marrow replacing bone lesion. - 2D echo on 02/21/2021 with EF 55-60%.   2.  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  She is non-smoker. - Mother died of breast cancer.  Maternal grandmother died very young in her 36s, sister has fibrocystic disease.  Father died of lung cancer and was a smoker.   PLAN:  1.  T3N3c triple negative  invasive lobular carcinoma of the left breast: - We reviewed results of the lumbar spine MRI on 02/20/2021 which did not show any evidence of spinal cord or bone lesion. - We reviewed echocardiogram from 02/21/2021 which showed ejection fraction 55 to 60%. - She already has port placed by Dr. Ninfa Linden. - Due to advanced nature of her disease, she would benefit from neoadjuvant chemotherapy followed by surgical resection. - We talked about keynote 522 trial with addition of pembrolizumab to standard chemotherapy with [facility] followed by paclitaxel and carboplatin. - She has a family reunion coming up in first week of July which she does not want to miss. - We will start her on weekly carboplatin and paclitaxel along with pembrolizumab this week.  She will receive her second weekly dose next week and will resume week 3 after she comes back from family vacation in [place]. - We discussed side effects of this regimen in detail.  Breast Cancer therapy associated bone loss: I have recommended calcium, Vitamin D and weight bearing exercises.  Orders placed this encounter:  No orders of the defined types were placed in this encounter.   The patient has a good understanding of the overall plan. She agrees with it. She will call with any problems that may develop before the next visit here.  Derek Jack, MD [facility] [phone]   I, Thana Ates, am acting as a scribe for Dr. Derek Jack.  I, Derek Jack MD, have reviewed the above documentation for accuracy and completeness, and I agree with the above.

## 2021-02-20 NOTE — Telephone Encounter (Signed)
Attempted to reach Denise [name] with genetic test results, however her voicemail was full and I cannot leave a message. We will try her again at another time.

## 2021-02-21 ENCOUNTER — Other Ambulatory Visit: Payer: Self-pay

## 2021-02-21 ENCOUNTER — Ambulatory Visit (HOSPITAL_COMMUNITY)
Admission: RE | Admit: 2021-02-21 | Discharge: 2021-02-21 | Disposition: A | Discharge status: Home / Self Care | Payer: Medicare Other | Source: Ambulatory Visit | Attending: Hematology | Admitting: Hematology

## 2021-02-21 DIAGNOSIS — Z0189 Encounter for other specified special examinations: Secondary | ICD-10-CM

## 2021-02-21 DIAGNOSIS — Z5111 Encounter for antineoplastic chemotherapy: Secondary | ICD-10-CM | POA: Diagnosis not present

## 2021-02-21 DIAGNOSIS — C50912 Malignant neoplasm of unspecified site of left female breast: Secondary | ICD-10-CM

## 2021-02-21 DIAGNOSIS — Z803 Family history of malignant neoplasm of breast: Secondary | ICD-10-CM | POA: Diagnosis not present

## 2021-02-21 LAB — ECHOCARDIOGRAM COMPLETE
Area-P 1/2: 2.58 cm2
Calc EF: 55.8 %
S' Lateral: 1.7 cm
Single Plane A2C EF: 56.4 %
Single Plane A4C EF: 55.7 %

## 2021-02-21 NOTE — Progress Notes (Signed)
*  PRELIMINARY RESULTS* Echocardiogram 2D Echocardiogram has been performed.  Samuel Germany 02/21/2021, 2:55 PM

## 2021-02-24 ENCOUNTER — Telehealth: Payer: Self-pay | Admitting: Genetic Counselor

## 2021-02-24 ENCOUNTER — Inpatient Hospital Stay (HOSPITAL_COMMUNITY): Payer: Medicare Other | Attending: Hematology | Admitting: Hematology

## 2021-02-24 ENCOUNTER — Encounter (HOSPITAL_COMMUNITY): Payer: Self-pay

## 2021-02-24 ENCOUNTER — Ambulatory Visit: Payer: Self-pay | Admitting: Genetic Counselor

## 2021-02-24 ENCOUNTER — Encounter: Payer: Self-pay | Admitting: Genetic Counselor

## 2021-02-24 ENCOUNTER — Other Ambulatory Visit: Payer: Self-pay

## 2021-02-24 VITALS — BP 119/75 | HR 73 | Temp 97.2°F | Resp 18 | Wt 176.6 lb

## 2021-02-24 DIAGNOSIS — Z803 Family history of malignant neoplasm of breast: Secondary | ICD-10-CM

## 2021-02-24 DIAGNOSIS — Z5111 Encounter for antineoplastic chemotherapy: Secondary | ICD-10-CM

## 2021-02-24 DIAGNOSIS — Z79890 Hormone replacement therapy (postmenopausal): Secondary | ICD-10-CM | POA: Insufficient documentation

## 2021-02-24 DIAGNOSIS — Z1379 Encounter for other screening for genetic and chromosomal anomalies: Secondary | ICD-10-CM

## 2021-02-24 DIAGNOSIS — C50912 Malignant neoplasm of unspecified site of left female breast: Secondary | ICD-10-CM

## 2021-02-24 DIAGNOSIS — E039 Hypothyroidism, unspecified: Secondary | ICD-10-CM | POA: Insufficient documentation

## 2021-02-24 DIAGNOSIS — Z79899 Other long term (current) drug therapy: Secondary | ICD-10-CM | POA: Diagnosis not present

## 2021-02-24 DIAGNOSIS — K529 Noninfective gastroenteritis and colitis, unspecified: Secondary | ICD-10-CM | POA: Insufficient documentation

## 2021-02-24 DIAGNOSIS — I10 Essential (primary) hypertension: Secondary | ICD-10-CM | POA: Insufficient documentation

## 2021-02-24 DIAGNOSIS — Z0189 Encounter for other specified special examinations: Secondary | ICD-10-CM

## 2021-02-24 NOTE — Telephone Encounter (Signed)
Spoke with daughter and let her know that her mother's VM is full and has been since last week.  We are unable to leave a message.  Could she let her mother know that we are trying to reach her and to please call.  Left CB number.  Daughter will talk with her mother and let her know.

## 2021-02-24 NOTE — Progress Notes (Signed)
HPI:  Ms. Peplinski was previously seen in the [facility] clinic due to a personal and family history of cancer and concerns regarding a hereditary predisposition to cancer. Please refer to our prior cancer genetics clinic note for more information regarding our discussion, assessment and recommendations, at the time. Ms. Gane recent genetic test results were disclosed to her, as were recommendations warranted by these results. These results and recommendations are discussed in more detail below.  CANCER HISTORY:  Oncology History  Invasive lobular carcinoma of left breast in female [facility])  01/23/2021 Initial Diagnosis   Invasive lobular carcinoma of left breast in female [facility])   02/19/2021 Genetic Testing   Negative genetic testing on the CancerNext-Expanded+RNAinsight panel.  SMARCB1 VUS identified.  The CancerNext-Expanded gene panel offered by [facility] and includes sequencing and rearrangement analysis for the following 77 genes: AIP, ALK, APC*, ATM*, AXIN2, BAP1, BARD1, BLM, BMPR1A, BRCA1*, BRCA2*, BRIP1*, CDC73, CDH1*, CDK4, CDKN1B, CDKN2A, CHEK2*, CTNNA1, DICER1, FANCC, FH, FLCN, GALNT12, KIF1B, LZTR1, MAX, MEN1, MET, MLH1*, MSH2*, MSH3, MSH6*, MUTYH*, NBN, NF1*, NF2, NTHL1, PALB2*, PHOX2B, PMS2*, POT1, PRKAR1A, PTCH1, PTEN*, RAD51C*, RAD51D*, RB1, RECQL, RET, SDHA, SDHAF2, SDHB, SDHC, SDHD, SMAD4, SMARCA4, SMARCB1, SMARCE1, STK11, SUFU, TMEM127, TP53*, TSC1, TSC2, VHL and XRCC2 (sequencing and deletion/duplication); EGFR, EGLN1, HOXB13, KIT, MITF, PDGFRA, POLD1, and POLE (sequencing only); EPCAM and GREM1 (deletion/duplication only). DNA and RNA analyses performed for * genes. The report date is February 19, 2021.     FAMILY HISTORY:  We obtained a detailed, 4-generation family history.  Significant diagnoses are listed below: Family History  Problem Relation Age of Onset   Breast cancer Mother        dx in her 38s   Heart disease Mother    Thyroid disease Mother    Lung  cancer Father        dx in his 14s   Thyroid disease Maternal Aunt    Thyroid nodules Maternal Grandmother 21       goiter   Thyroid disease Maternal Grandmother    Heart disease Maternal Grandfather    Heart disease Paternal Grandmother    Heart disease Paternal Grandfather    Thyroid disease Daughter    Thyroid disease Daughter    Cancer Maternal Uncle        NOS   Cancer Paternal Uncle        NOS   Breast cancer Cousin        pat first cousin died in her 66s;     The patient has two daughters and a son who are cancer free.  She has two brothers and a sister, all cancer free.  Both parents are deceased.   The patient's father died of lung cancer.  He had a brother and sister, and the brother had a cancer NOS.  That brother had a daughter who died of breast cancer in her 53's.  The paternal grandparents are deceased.   The patient's mother was diagnosed with breast cancer in her 52's and died at 21.  She had three brothers, two had cancer NOS.  The maternal grandparents are deceased.   Ms. Soler is unaware of previous family history of genetic testing for hereditary cancer risks. Patient's maternal ancestors are of [place] and [place] descent, and paternal ancestors are of [place] and [place] descent. There is no reported Ashkenazi Jewish ancestry. There is no known consanguinity.  GENETIC TEST RESULTS: Genetic testing reported out on February 19, 2021 through the CancerNext-Expanded+RNAinsight cancer panel  found no pathogenic mutations. The CancerNext-Expanded gene panel offered by [facility] and includes sequencing and rearrangement analysis for the following 77 genes: AIP, ALK, APC*, ATM*, AXIN2, BAP1, BARD1, BLM, BMPR1A, BRCA1*, BRCA2*, BRIP1*, CDC73, CDH1*, CDK4, CDKN1B, CDKN2A, CHEK2*, CTNNA1, DICER1, FANCC, FH, FLCN, GALNT12, KIF1B, LZTR1, MAX, MEN1, MET, MLH1*, MSH2*, MSH3, MSH6*, MUTYH*, NBN, NF1*, NF2, NTHL1, PALB2*, PHOX2B, PMS2*, POT1, PRKAR1A, PTCH1, PTEN*, RAD51C*, RAD51D*,  RB1, RECQL, RET, SDHA, SDHAF2, SDHB, SDHC, SDHD, SMAD4, SMARCA4, SMARCB1, SMARCE1, STK11, SUFU, TMEM127, TP53*, TSC1, TSC2, VHL and XRCC2 (sequencing and deletion/duplication); EGFR, EGLN1, HOXB13, KIT, MITF, PDGFRA, POLD1, and POLE (sequencing only); EPCAM and GREM1 (deletion/duplication only). DNA and RNA analyses performed for * genes. The test report has been scanned into EPIC and is located under the Molecular Pathology section of the Results Review tab.  A portion of the result report is included below for reference.     We discussed with Ms. Pittmon that because current genetic testing is not perfect, it is possible there may be a gene mutation in one of these genes that current testing cannot detect, but that chance is small.  We also discussed, that there could be another gene that has not yet been discovered, or that we have not yet tested, that is responsible for the cancer diagnoses in the family. It is also possible there is a hereditary cause for the cancer in the family that Ms. Reinertsen did not inherit and therefore was not identified in her testing.  Therefore, it is important to remain in touch with cancer genetics in the future so that we can continue to offer Ms. Moyano the most up to date genetic testing.   Genetic testing did identify a variant of uncertain significance (VUS) was identified in the SMARCB1 gene called p.Y81C.  At this time, it is unknown if this variant is associated with increased cancer risk or if this is a normal finding, but most variants such as this get reclassified to being inconsequential. It should not be used to make medical management decisions. With time, we suspect the lab will determine the significance of this variant, if any. If we do learn more about it, we will try to contact Ms. Saefong to discuss it further. However, it is important to stay in touch with [place] periodically and keep the address and phone number up to date.  ADDITIONAL GENETIC TESTING: We  discussed with Ms. Dionisio that her genetic testing was fairly extensive.  If there are genes identified to increase cancer risk that can be analyzed in the future, we would be happy to discuss and coordinate this testing at that time.    CANCER SCREENING RECOMMENDATIONS: Ms. Khurana test result is considered negative (normal).  This means that we have not identified a hereditary cause for her personal and family history of cancer at this time. Most cancers happen by chance and this negative test suggests that her cancer may fall into this category.    While reassuring, this does not definitively rule out a hereditary predisposition to cancer. It is still possible that there could be genetic mutations that are undetectable by current technology. There could be genetic mutations in genes that have not been tested or identified to increase cancer risk.  Therefore, it is recommended she continue to follow the cancer management and screening guidelines provided by her oncology and primary healthcare provider.   An individual's cancer risk and medical management are not determined by genetic test results alone. Overall cancer risk assessment incorporates  additional factors, including personal medical history, family history, and any available genetic information that may result in a personalized plan for cancer prevention and surveillance  RECOMMENDATIONS FOR FAMILY MEMBERS:  Individuals in this family might be at some increased risk of developing cancer, over the general population risk, simply due to the family history of cancer.  We recommended women in this family have a yearly mammogram beginning at age 64, or 47 years younger than the earliest onset of cancer, an annual clinical breast exam, and perform monthly breast self-exams. Women in this family should also have a gynecological exam as recommended by their primary provider. All family members should be referred for colonoscopy starting at age  82.  FOLLOW-UP: Lastly, we discussed with Ms. Wisenbaker that cancer genetics is a rapidly advancing field and it is possible that new genetic tests will be appropriate for her and/or her family members in the future. We encouraged her to remain in contact with cancer genetics on an annual basis so we can update her personal and family histories and let her know of advances in cancer genetics that may benefit this family.   Our contact number was provided. Ms. Swayze questions were answered to her satisfaction, and she knows she is welcome to call us at anytime with additional questions or concerns.   Roma Kayser, [place], [facility] Licensed, Certified Genetic Counselor [email or web address]

## 2021-02-24 NOTE — Telephone Encounter (Signed)
Revealed negative genetic testing.  Discussed that we do not know why she has breast cancer or why there is cancer in the family. It could be due to a different gene that we are not testing, or maybe our current technology may not be able to pick something up.  It will be important for her to keep in contact with genetics to keep up with whether additional testing may be needed.  There is one VUS identified.  This will not change medical management.

## 2021-02-24 NOTE — Patient Instructions (Addendum)
[place] at [facility] Discharge Instructions  You were seen today by Dr. Delton Coombes. He went over your recent MRI results - there is no concern for cancer in your spine. Dr. Delton Coombes and Dr. Ninfa Linden have discussed your breast cancer and have recommended chemotherapy for 24 weeks prior to surgery.  Dr. Delton Coombes has recommended starting with Carboplatin and Paclitaxel one weekly for 12 weeks. You will be off June 27 - July 11 so that you have the energy to participate in your trip. Following 12 weeks of the above regimen you will receive 12 weeks of Adriamycin and Cytoxan, this is given every 3 weeks. You will receive immunotherapy, Keytruda with both regimens. This is given every 3 weeks.  You will begin treatment this week and see Dr. Delton Coombes prior to your second treatment.   Thank you for choosing [place] at [facility] to provide your oncology and hematology care.  To afford each patient quality time with our provider, please arrive at least 15 minutes before your scheduled appointment time.   If you have a lab appointment with the [facility] please come in thru the Main Entrance and check in at the main information desk  You need to re-schedule your appointment should you arrive 10 or more minutes late.  We strive to give you quality time with our providers, and arriving late affects you and other patients whose appointments are after yours.  Also, if you no show three or more times for appointments you may be dismissed from the clinic at the providers discretion.     Again, thank you for choosing [facility].  Our hope is that these requests will decrease the amount of time that you wait before being seen by our physicians.       _____________________________________________________________  Should you have questions after your visit to [facility], please contact our office at [phone] between the  hours of 8:00 a.m. and 4:30 p.m.  Voicemails left after 4:00 p.m. will not be returned until the following business day.  For prescription refill requests, have your pharmacy contact our office and allow 72 hours.    Cancer Center Support Programs:   > Cancer Support Group  2nd Tuesday of the month 1pm-2pm, Journey Room

## 2021-02-24 NOTE — Telephone Encounter (Signed)
Mailbox is full and I cannot leave message.

## 2021-02-24 NOTE — Progress Notes (Signed)
START ON PATHWAY REGIMEN - Breast     Cycles 1 through 4: A cycle is every 21 days:     Pembrolizumab      Paclitaxel      Carboplatin      Filgrastim-xxxx    Cycles 5 through 8: A cycle is every 21 days:     Pembrolizumab      Doxorubicin      Cyclophosphamide      Pegfilgrastim-xxxx   **Always confirm dose/schedule in your pharmacy ordering system**  Patient Characteristics: Preoperative or Nonsurgical Candidate (Clinical Staging), Neoadjuvant Therapy followed by Surgery, Invasive Disease, Chemotherapy, HER2 Negative/Unknown/Equivocal, ER Negative/Unknown, Platinum Therapy Indicated, High-Risk Disease Present Therapeutic Status: Preoperative or Nonsurgical Candidate (Clinical Staging) AJCC M Category: cM0 AJCC Grade: G2 Breast Surgical Plan: Neoadjuvant Therapy followed by Surgery ER Status: Negative (-) AJCC 8 Stage Grouping: IIIC HER2 Status: Negative (-) AJCC T Category: cT3 AJCC N Category: cN3c PR Status: Negative (-) Type of Therapy: Platinum Therapy Indicated Intent of Therapy: Curative Intent, Discussed with Patient 

## 2021-02-24 NOTE — Progress Notes (Signed)
I met with the patient today during and after visit with Dr. Delton Coombes. I provided written information on the discussed treatment regimen and offered time for questions and concerns. All questions were addressed and answered to the patient's satisfaction.

## 2021-02-26 ENCOUNTER — Encounter (HOSPITAL_COMMUNITY): Payer: Self-pay | Admitting: Hematology

## 2021-02-26 ENCOUNTER — Encounter (HOSPITAL_COMMUNITY): Payer: Self-pay

## 2021-02-26 DIAGNOSIS — Z95828 Presence of other vascular implants and grafts: Secondary | ICD-10-CM | POA: Insufficient documentation

## 2021-02-26 HISTORY — DX: Presence of other vascular implants and grafts: Z95.828

## 2021-02-26 MED ORDER — LIDOCAINE-PRILOCAINE 2.5-2.5 % EX CREA: TOPICAL_CREAM | CUTANEOUS | 3 refills | Status: DC

## 2021-02-26 MED ORDER — PROCHLORPERAZINE MALEATE 10 MG PO TABS: 10.0000 mg | ORAL_TABLET | Freq: Four times a day (QID) | ORAL | 1 refills | Status: DC | PRN

## 2021-02-26 NOTE — Patient Instructions (Addendum)
[facility] Chemotherapy Teaching   You are diagnosed with Stage IIIc triple negative breast cancer of the left breast.  You will be treated weekly in the clinic for 12 weeks with a combination of drugs.  Those drugs are paclitaxel (Taxol), carboplatin, and pembrolizumab (Keytruda).  You will receive the Taxol and carboplatin each week.  You will receive the Keytruda every 3 weeks.  After 12 weeks of this treatment, you will then be started on another combination of chemotherapy drugs that you will receive every 2 weeks for 4 cycles of treatment.  Those drugs are doxorubicin (Adriamycin) and cyclophosphamide (Cytoxan).  The intent of treatment is cure.  You will see the doctor regularly throughout treatment.  We will obtain blood work from you prior to every treatment and monitor your results to make sure it is safe to give your treatment. The doctor monitors your response to treatment by the way you are feeling, your blood work, and by obtaining scans periodically.  There will be wait times while you are here for treatment.  It will take about 30 minutes to 1 hour for your lab work to result.  Then there will be wait times while pharmacy mixes your medications.    Medications you will receive in the clinic prior to your chemotherapy medications:   Aloxi:  ALOXI is used in adults to help prevent the nausea and vomiting that happens with certain chemotherapy drugs.  Aloxi is a long acting medication, and will remain in your system for about 2 days.    Dexamethasone:  This is a steroid given prior to chemotherapy to help prevent allergic reactions; it may also help prevent and control nausea and diarrhea.    Pepcid:  This medication is a histamine blocker that helps prevent and allergic reaction to your chemotherapy.    Benadryl:  This is a histamine blocker (different from the Pepcid) that helps prevent allergic/infusion reactions to your chemotherapy. This medication may cause  dizziness/drowsiness.   Paclitaxel (Taxol)   About This Drug Paclitaxel is a drug used to treat cancer. It is given in the vein (IV).  This will take 1 hour to infuse.  This first infusion will take longer to because the infusion rate is increased slowly (every 15 minutes until the maximum rate is reached) to monitor for reactions.  Your nurse will be in the room with you for the first 15 minutes of the first infusion.  Possible Side Effects   Hair loss. Hair loss is often temporary, although with certain medicine, hair loss can sometimes be permanent. Hair loss may happen suddenly or gradually. If you lose hair, you may lose it from your head, face, armpits, pubic area, chest, and/or legs. You may also notice your hair getting thin.   Swelling of your legs, ankles and/or feet (edema)   Flushing   Nausea and throwing up (vomiting)   Loose bowel movements (diarrhea)   Bone marrow depression. This is a decrease in the number of white blood cells, red blood cells, and platelets. This may raise your risk of infection, make you tired and weak (fatigue), and raise your risk of bleeding.   Effects on the nerves are called peripheral neuropathy. You may feel numbness, tingling, or pain in your hands and feet. It may be hard for you to button your clothes, open jars, or walk as usual. The effect on the nerves may get worse with more doses of the drug. These effects get better in some people  after the drug is stopped but it does not get better in all people.   Changes in your liver function   Bone, joint and muscle pain   Abnormal EKG   Allergic reaction: Allergic reactions, including anaphylaxis are rare but may happen in some patients. Signs of allergic reaction to this drug may be swelling of the face, feeling like your tongue or throat are swelling, trouble breathing, rash, itching, fever, chills, feeling dizzy, and/or feeling that your heart is beating in a fast or not normal way. If this  happens, do not take another dose of this drug. You should get urgent medical treatment.   Infection   Changes in your kidney function.  Note: Each of the side effects above was reported in 20% or greater of patients treated with paclitaxel. Not all possible side effects are included above.  Warnings and Precautions   Severe allergic reactions   Severe bone marrow depression  Treating Side Effects   To help with hair loss, wash with a mild shampoo and avoid washing your hair every day.   Avoid rubbing your scalp, instead, pat your hair or scalp dry   Avoid coloring your hair   Limit your use of hair spray, electric curlers, blow dryers, and curling irons.   If you are interested in getting a wig, talk to your nurse. You can also call the [employer] at 800-ACS-2345 to find out information about the "Look Good, Feel Better" program close to where you live. It is a free program where women getting chemotherapy can learn about wigs, turbans and scarves as well as makeup techniques and skin and nail care.   Ask your doctor or nurse about medicines that are available to help stop or lessen diarrhea and/or nausea.   To help with nausea and vomiting, eat small, frequent meals instead of three large meals a day. Choose foods and drinks that are at room temperature. Ask your nurse or doctor about other helpful tips and medicine that is available to help or stop lessen these symptoms.   If you get diarrhea, eat low-fiber foods that are high in protein and calories and avoid foods that can irritate your digestive tracts or lead to cramping. Ask your nurse or doctor about medicine that can lessen or stop your diarrhea.   Mouth care is very important. Your mouth care should consist of routine, gentle cleaning of your teeth or dentures and rinsing your mouth with a mixture of 1/2 teaspoon of salt in 8 ounces of water or  teaspoon of baking soda in 8 ounces of water. This should be  done at least after each meal and at bedtime.   If you have mouth sores, avoid mouthwash that has alcohol. Also avoid alcohol and smoking because they can bother your mouth and throat.   Drink plenty of fluids (a minimum of eight glasses per day is recommended).   Take your temperature as your doctor or nurse tells you, and whenever you feel like you may have a fever.   Talk to your doctor or nurse about precautions you can take to avoid infections and bleeding.   Be careful when cooking, walking, and handling sharp objects and hot liquids.  Food and Drug Interactions   There are no known interactions of paclitaxel with food.   This drug may interact with other medicines. Tell your doctor and pharmacist about all the medicines and dietary supplements (vitamins, minerals, herbs and others) that you are taking at this  time.   The safety and use of dietary supplements and alternative diets are often not known. Using these might affect your cancer or interfere with your treatment. Until more is known, you should not use dietary supplements or alternative diets without your cancer doctor's help.  When to Call the Doctor  Call your doctor or nurse if you have any of the following symptoms and/or any new or unusual symptoms:   Fever of 100.4 F (38 C) or above   Chills   Redness, pain, warmth, or swelling at the IV site during the infusion   Signs of allergic reaction: swelling of the face, feeling like your tongue or throat are swelling, trouble breathing, rash, itching, fever, chills, feeling dizzy, and/or feeling that your heart is beating in a fast or not normal way   Feeling that your heart is beating in a fast or not normal way (palpitations)   Weight gain of 5 pounds in one week (fluid retention)   Decreased urine or very dark urine   Signs of liver problems: dark urine, pale bowel movements, bad stomach pain, feeling very tired and weak, unusual  itching, or yellowing of the  eyes or skin   Heavy menstrual period that lasts longer than normal   Easy bruising or bleeding   Nausea that stops you from eating or drinking, and/or that is not relieved by prescribed medicines.   Loose bowel movements (diarrhea) more than 4 times a day or diarrhea with weakness or lightheadedness   Pain in your mouth or throat that makes it hard to eat or drink   Lasting loss of appetite or rapid weight loss of five pounds in a week   Signs of peripheral neuropathy: numbness, tingling, or decreased feeling in fingers or toes; trouble walking or changes in the way you walk; or feeling clumsy when buttoning clothes, opening jars, or other routine activities   Joint and muscle pain that is not relieved by prescribed medicines   Extreme fatigue that interferes with normal activities   While you are getting this drug, please tell your nurse right away if you have any pain, redness, or swelling at the site of the IV infusion.   If you think you are pregnant.  Reproduction Warnings   Pregnancy warning: This drug may have harmful effects on the unborn child, it is recommended that effective methods of birth control should be used during your cancer treatment. Let your doctor know right away if you think you may be pregnant.   Breast feeding warning: Women should not breast feed during treatment because this drug could enter the breastmilk and cause harm to a breast feeding baby.   Carboplatin (Paraplatin, CBDCA)  About This Drug  Carboplatin is used to treat cancer. It is given in the vein (IV).  It will take 30 minutes to infuse.   Possible Side Effects   Bone marrow suppression. This is a decrease in the number of white blood cells, red blood cells, and platelets. This may raise your risk of infection, make you tired and weak (fatigue), and raise your risk of bleeding.   Nausea and vomiting (throwing up)   Weakness   Changes in your liver function   Changes in your  kidney function   Electrolyte changes   Pain  Note: Each of the side effects above was reported in 20% or greater of patients treated with carboplatin. Not all possible side effects are included above.   Warnings and Precautions   Severe  bone marrow suppression   Allergic reactions, including anaphylaxis are rare but may happen in some patients. Signs of allergic reaction to this drug may be swelling of the face, feeling like your tongue or throat are swelling, trouble breathing, rash, itching, fever, chills, feeling dizzy, and/or feeling that your heart is beating in a fast or not normal way. If this happens, do not take another dose of this drug. You should get urgent medical treatment.   Severe nausea and vomiting   Effects on the nerves are called peripheral neuropathy. This risk is increased if you are over the age of 28 or if you have received other medicine with risk of peripheral neuropathy. You may feel numbness, tingling, or pain in your hands and feet. It may be hard for you to button your clothes, open jars, or walk as usual. The effect on the nerves may get worse with more doses of the drug. These effects get better in some people after the drug is stopped but it does not get better in all people.   Blurred vision, loss of vision or other changes in eyesight   Decreased hearing   - Skin and tissue irritation including redness, pain, warmth, or swelling at the IV site if the drug leaks out of the vein and into nearby tissue.   Severe changes in your kidney function, which can cause kidney failure   Severe changes in your liver function, which can cause liver failure  Note: Some of the side effects above are very rare. If you have concerns and/or questions, please discuss them with your medical team.   Important Information   This drug may be present in the saliva, tears, sweat, urine, stool, vomit, semen, and vaginal secretions. Talk to your doctor and/or your nurse  about the necessary precautions to take during this time.   Treating Side Effects   Manage tiredness by pacing your activities for the day.   Be sure to include periods of rest between energy-draining activities.   To decrease the risk of infection, wash your hands regularly.   Avoid close contact with people who have a cold, the flu, or other infections.   Take your temperature as your doctor or nurse tells you, and whenever you feel like you may have a fever.   To help decrease the risk of bleeding, use a soft toothbrush. Check with your nurse before using dental floss.   Be very careful when using knives or tools.   Use an electric shaver instead of a razor.   Drink plenty of fluids (a minimum of eight glasses per day is recommended).   If you throw up or have loose bowel movements, you should drink more fluids so that you do not become dehydrated (lack of water in the body from losing too much fluid).   To help with nausea and vomiting, eat small, frequent meals instead of three large meals a day. Choose foods and drinks that are at room temperature. Ask your nurse or doctor about other helpful tips and medicine that is available to help stop or lessen these symptoms.   If you have numbness and tingling in your hands and feet, be careful when cooking, walking, and handling sharp objects and hot liquids.   Keeping your pain under control is important to your well-being. Please tell your doctor or nurse if you are experiencing pain.   Food and Drug Interactions   There are no known interactions of carboplatin with food.   This  drug may interact with other medicines. Tell your doctor and pharmacist about all the prescription and over-the-counter medicines and dietary supplements (vitamins, minerals, herbs and others) that you are taking at this time. Also, check with your doctor or pharmacist before starting any new prescription or over-the-counter medicines, or dietary  supplements to make sure that there are no interactions.   When to Call the Doctor  Call your doctor or nurse if you have any of these symptoms and/or any new or unusual symptoms:   Fever of 100.4 F (38 C) or higher   Chills   Tiredness that interferes with your daily activities   Feeling dizzy or lightheaded   Easy bleeding or bruising   Nausea that stops you from eating or drinking and/or is not relieved by prescribed medicines   Throwing up   Blurred vision or other changes in eyesight   Decrease in hearing or ringing in the ear   Signs of allergic reaction: swelling of the face, feeling like your tongue or throat are swelling, trouble breathing, rash, itching, fever, chills, feeling dizzy, and/or feeling that your heart is beating in a fast or not normal way. If this happens, call 911 for emergency care.   Signs of possible liver problems: dark urine, pale bowel movements, bad stomach pain, feeling very tired and weak, unusual itching, or yellowing of the eyes or skin   Decreased urine, or very dark urine   Numbness, tingling, or pain in your hands and feet   Pain that does not go away or is not relieved by prescribed medicine   While you are getting this drug, please tell your nurse right away if you have any pain, redness, or swelling at the site of the IV infusion, or if you have any new onset of symptoms, or if you just feel "different" from before when the infusion was started.   Reproduction Warnings   Pregnancy warning: This drug may have harmful effects on the unborn baby. Women of child bearing potential should use effective methods of birth control during your cancer treatment. Let your doctor know right away if you think you may be pregnant.   Breastfeeding warning: It is not known if this drug passes into breast milk. For this reason, women should not breastfeed during treatment because this drug could enter the breast milk and cause harm to a  breastfeeding baby.   Fertility warning: Human fertility studies have not been done with this drug. Talk with your doctor or nurse if you plan to have children. Ask for information on sperm or egg banking.   Pembrolizumab Beryle Flock)  About This Drug Pembrolizumab is used to treat cancer. It is given in the vein (IV).  This drug will take 30 minutes to infuse.  Possible Side Effects  Tiredness  Fever  Nausea  Decreased appetite (decreased hunger)  Loose bowel movements (diarrhea)  Constipation (not able to move bowels)  Trouble breathing  Rash  Itching  Muscle and bone pain  Cough  Note: Each of the side effects above was reported in 20% or greater of patients treated with pembrolizumab. Not all possible side effects are included above.  Warnings and Precautions   This drug works with your immune system and can cause inflammation in any of your organs and tissues and can change how they work. This may put you at risk for developing serious medical problems which can very rarely be fatal.   Colitis (swelling or inflammation in the colon) - symptoms are  loose bowel movements (diarrhea) stomach cramping, and sometimes blood in the stool   Changes in liver function. Your liver function will be checked as needed.   Changes in kidney function, which can very rarely be fatal. Your kidney function will be checked as needed.   Inflammation (swelling) of the lungs which can very rarely be fatal - you may have a dry cough or trouble breathing.   This drug may affect some of your hormone glands (especially the thyroid, adrenals, pituitary and pancreas). Your hormone levels will be checked as needed.   Blood sugar levels may change and you may develop diabetes. If you already have diabetes, changes may need to be made to your diabetes medication.   Severe allergic skin reaction, which can very rarely be fatal. You may develop blisters on your skin that are filled with fluid or a severe  red rash all over your body that may be painful.   Increased risk of organ rejection in patients who have received donor organs   Increased risk of complications in patients who will undergo a stem cell transplant after receiving pembrolizumab.   While you are getting this drug in your vein (IV), you may have a reaction to the drug. Your nurse will check you closely for these signs: fever or shaking chills, flushing, facial swelling, feeling dizzy, headache, trouble breathing, rash, itching, chest tightness, or chest pain. These reactions may occur after your infusion. If this happens, call 911 for emergency care.  Important Information This drug may be present in the saliva, tears, sweat, urine, stool, vomit, semen, and vaginal secretions. Talk to your doctor and/or your nurse about the necessary precautions to take during this time.  Treating Side Effects   Ask your doctor or nurse about medicines that are available to help stop or lessen constipation, diarrhea and/or nausea.   Drink plenty of fluids (a minimum of eight glasses per day is recommended).   If you are not able to move your bowels, check with your doctor or nurse before you use any enemas, laxatives, or suppositories   To help with nausea and vomiting, eat small, frequent meals instead of three large meals a day. Choose foods and drinks that are at room temperature. Ask your nurse or doctor about other helpful tips and medicine that is available to help or stop lessen these symptoms.   If you get diarrhea, eat low-fiber foods that are high in protein and calories and avoid foods that can irritate your digestive tracts or lead to cramping. Ask your nurse or doctor about medicine that can lessen or stop your diarrhea.   Manage tiredness by pacing your activities for the day. Be sure to include periods of rest between energy-draining activities   Keeping your pain under control is important to your wellbeing. Please tell your  doctor or nurse if you are experiencing pain.   If you have diabetes, keep good control of your blood sugar level. Tell your nurse or your doctor if your glucose levels are higher or lower than normal   If you get a rash do not put anything on it unless your doctor or nurse says you may. Keep the area around the rash clean and dry. Ask your doctor for medicine if your rash bothers you.   Infusion reactions may happen for 24 hours after your infusion. If this happens, call 911 for emergency care.  Food and Drug Interactions   There are no known interactions of pembrolizumab with food.  There are no known interactions of pembrolizumab with other medications.   Tell your doctor and pharmacist about all the medicines and dietary supplements (vitamins, minerals, herbs and others) that you are taking at this time. The safety and use of dietary supplements and alternative agents are often not known. Using these might affect your cancer or interfere with your treatment. Until more is known, you should not use dietary supplements or alternative agents without your cancer doctor's help.   When to Call the Doctor Call your doctor or nurse if you have any of the following symptoms and/or any new or unusual symptoms:   Fever of 100.4 F (38 C) or higher   Chills   Wheezing or trouble breathing   Rash or itching   Feeling dizzy or lightheaded   Loose bowel movements (diarrhea) more than 4 times a day or diarrhea with weakness or lightheadedness, or diarrhea that is not controlled by medications   Nausea that stops you from eating or drinking, and/or that is not relieved by prescribed medicines   Lasting loss of appetite or rapid weight loss of five pounds in a week   Fatigue that interferes with your daily activities   No bowel movement for 3 days or you feel uncomfortable   Extreme weakness that interferes with normal activities   Bad abdominal pain, especially in upper right area    Decreased urine   Unusual thirst or passing urine often   Rash that is not relieved by prescribed medicines   Flu-like symptoms: fever, headache, muscle and joint aches, and fatigue (low energy, feeling weak)   Signs of liver problems: dark urine, pale bowel movements, bad stomach pain, feeling very tired and weak, unusual itching, or yellowing of the eyes or skin   Signs of infusion reactions such as fever or shaking chills, flushing, facial swelling, feeling dizzy, headache, trouble breathing, rash, itching, chest tightness, or chest pain.   Reproduction Warnings   Pregnancy warning: This drug may have harmful effects on the unborn baby. Women of childbearing potential should use effective methods of birth control during your cancer treatment and for at least 4 months after treatment. Let your doctor know right away if you think you may be pregnant   Breast feeding warning: It is not known if this drug passes into breast milk. It is recommended that women do not breastfeed during treatment and for 4 months after treatment.   Fertility warning: Human fertility studies have not been done with this drug. Talk with your doctor or nurse if you plan to have children.  Medications you will receive in the clinic prior to your Adriamycin and Cytoxan:  Aloxi:  ALOXI is used in adults to help prevent the nausea and vomiting that happens with certain chemotherapy drugs.  Aloxi is a long acting medication, and will remain in your system for about 2 days.   Emend:  This is an anti-nausea medication that is used with Aloxi to help prevent nausea and vomiting caused by chemotherapy.  Dexamethasone:  This is a steroid given prior to chemotherapy to help prevent allergic reactions; it may also help prevent and control nausea and diarrhea.    Neulasta (or similar) - this medication is not chemo but is being given because you have had chemo. It is usually given 24-72 hours after the completion of  chemotherapy. This medication works by boosting your bone marrow's supply of white blood cells. White blood cells are what protect our bodies against infection. The medication  is given in the form of a subcutaneous injection. It is given in the fatty tissue of your abdomen or in the skin in the back of your arm. It is a short needle. The major side effect of this medication is bone or muscle pain. The drug of choice to relieve or lessen the pain is Aleve or Ibuprofen. If a physician has ever told you not to take Aleve or Ibuprofen - then don't take it. You should then take Tylenol/acetaminophen and/or Claritin. Take either medication as the bottle directs you to.  The level of pain you experience as a result of this injection can range from none, to mild or moderate, or severe. Please let us know if you develop moderate or severe bone pain.   You can take Claritin 10 mg over the counter for a few days after receiving neulasta to help with the bone aches and pains.   Cyclophosphamide (Generic Name) Other Names: Cytoxan, Neosar  About This Drug Cyclophosphamide is a drug used to treat cancer. It is given in the vein (IV) or by mouth.  It takes 30 minutes for this drug to infuse.  Possible Side Effects (More Common)  Nausea and throwing up (vomiting). These symptoms may happen within a few hours after your treatment and may last up to 72 hours. Medicines are available to stop or lessen these side effects.   Bone marrow depression. This is a decrease in the number of white blood cells, red blood cells, and platelets. This may raise your risk of infection, make you tired and weak (fatigue), and raise your risk of bleeding.   Hair loss: You may notice hair getting thin. Some patients lose their hair. Hair loss is often complete scalp hair loss and can involve loss of eyebrows, eyelashes, and pubic hair. You may notice this a few days or weeks after treatment has started. Most often hair loss is temporary;  your hair should grow back when treatment is done.   Decreased appetite (decreased hunger)   Blurred vision   Soreness of the mouth and throat. You may have red areas, white patches, or sores that hurt.   Effects on the bladder. This drug may cause irritation and bleeding in the bladder. You may have blood in your urine. To help stop this, you will get extra fluids to help you pass more urine. You may get a drug called mesna, which helps to decrease irritation and bleeding. You may also get a medicine to help you pass more urine. You may have a catheter (tube) placed in your bladder so that your bladder will be washed with this drug.  Possible Side Effects (Less Common)  Darkening of the skin or nails   Metallic taste in the mouth   Changes in lung tissue may happen with large amounts of this drug. These changes may not last forever, and your lung tissue may go back to normal. Sometimes these changes may not be seen for many years. You may get a cough or have trouble catching your breath.  Allergic Reactions   Serious allergic reactions including anaphylaxis are rare. While you are getting this drug in your vein (IV), tell your nurse right away if you have any of these symptoms of an allergic reaction:  Trouble catching your breath   Feeling like your tongue or throat are swelling   Feeling your heart beat quickly or in a not normal way (palpitations)   Feeling dizzy or lightheaded   Flushing, itching, rash, and/or  hives  Treating Side Effects  Drink 6-8 cups of fluids each day unless your doctor has told you to limit your fluid intake due to some other health problem. A cup is 8 ounces of fluid. If you throw up or have loose bowel movements you should drink more fluids so that you do not become dehydrated (lack water in the body due to losing too much fluid).   Ask your doctor or nurse about medicine that is available to help stop or lessen nausea or throwing up.   Mouth care is  very important. Your mouth care should consist of routine, gentle cleaning of your teeth or dentures and rinsing your mouth with a mixture of 1/2 teaspoon of salt in 8 ounces of water or  teaspoon of baking soda in 8 ounces of water. This should be done at least after each meal and at bedtime.   If you have mouth sores, avoid mouthwash that has alcohol. Also avoid alcohol and smoking because they can bother your mouth and throat.   Talk with your nurse about getting a wig before you lose your hair. Also, call the [facility] at 800-ACS-2345 to find out information about the " Look Good.Marland KitchenMarland KitchenFeel Better" program close to where you live. It is a free program where women undergoing chemotherapy learn about wigs, turbans and scarves as well as makeup techniques and skin and nail care.  Important Information  Whenever you tell a doctor or nurse your health history, always tell them that you have received cyclophosphamide in the past.   If you take this drug by mouth swallow the medicine whole. Do not chew, break or crush it.   You can take the medicine with or without food. If you have nausea, take it with food. Do not take the pills at bedtime.  Food and Drug Interactions There are no known interactions of cyclophosphamide with food. This drug may interact with other medicines. Tell your doctor and pharmacist about all the medicines and dietary supplements (vitamins, minerals, herbs and others) that you are taking at this time. The safety and use of dietary supplements and alternative diets are often not known. Using these might affect your cancer or interfere with your treatment. Until more is known, you should not use dietary supplements or alternative diets without your cancer doctor's help.  When to Call the Doctor  Call your doctor or nurse right away if you have any of these symptoms:   Fever of 100.4 F (38 C) or higher   Chills   Bleeding or bruising that is not normal    Blurred vision or other changes in eyesight   Pain when passing urine; blood in urine   Pain in your lower back or side   Wheezing or trouble breathing   Swelling of legs, ankles, or feet   Feeling dizzy or lightheaded   Feeling confused or agitated   Signs of liver problems: dark urine, pale bowel movements, bad stomach pain, feeling very tired and weak, unusual itching, or yellowing of the eyes or skin   Unusual thirst or passing urine often   Nausea that stops you from eating or drinking   Throwing up more than 3 times a day  Call your doctor or nurse as soon as possible if any of these symptoms happen:   Pain in your mouth or throat that makes it hard to eat or drink   Nausea not relieved by prescribed medicines  Sexual Problems and Reproductive Concerns  Infertility warning: Sexual problems and reproduction concerns may happen. In both men and women, this drug may affect your ability to have children. This cannot be determined before your treatment. Talk with your doctor or nurse if you plan to have children. Ask for information on sperm or egg banking.   In men, this drug may interfere with your ability to make sperm, but it should not change your ability to have sexual relations.   In women, menstrual bleeding may become irregular or stop while you are getting this drug. Do not assume that you cannot become pregnant if you do not have a menstrual period.   Women may go through signs of menopause (change of life) like vaginal dryness or itching. Vaginal lubricants can be used to lessen vaginal dryness, itching, and pain during sexual relations.   Genetic counseling is available for you to talk about the effects of this drug therapy on future pregnancies. Also, a genetic counselor can look at the possible risk of problems in the unborn baby due to this medicine if an exposure happens during pregnancy.   Pregnancy warning: This drug may have harmful effects on the unborn  child, so effective methods of birth control should be used during your cancer treatment.   Breast feeding warning: Women should not breast feed during treatment because this drug could enter the breast milk and badly harm a breast feeding baby   Doxorubicin (Generic Name) Other Names: Adriamycin, hydroxyl daunorubicin  About This Drug Doxorubicin is a drug used to treat cancer. This drug is given in the vein (IV).  This drug is an IV push over about 10 minutes.    Possible Side Effects (More Common)  Bone marrow depression. This is a decrease in the number of white blood cells, red blood cells, and platelets. This may raise your risk of infection, make you tired and weak (fatigue), and raise your risk of bleeding.   Hair loss: Hair loss is often complete scalp hair loss and can involve loss of eyebrows, eyelashes, and pubic hair. You may notice this a few days or weeks after treatment has started. Most often hair loss is temporary; your hair should grow back when treatment is done.   Nausea and throwing up (vomiting). These symptoms may happen within a few hours after your treatment and may last up to 24 hours. Medicines are available to stop or lessen these side effects.   Soreness of the mouth and throat. You may have red areas, white patches, or sores that hurt.   Change in the color of your urine to pink or red. This color change will go away in one to two days.   Effects on the heart: This drug can weaken the heart and lower heart function. Your heart function will be checked as needed. You may have trouble catching your breath, mainly during activities. You may also have trouble breathing while lying down, and have swelling in your ankles.   Sensitivity to light (photosensitivity). Photosensitivity means that you may become more sensitive to the effects of the sun, sun lamps, and tanning beds. Your eyes may water more, mostly in bright light.   Metallic taste in the mouth: This may  change the taste of food and drinks   Decreased appetite (decreased hunger)   Darkening of the skin or nails   Weakness that interferes with your daily activities  Possible Side Effects (Less Common)  Skin and tissue irritation may involve redness, pain, warmth, or swelling at the  IV site. This happens if the drug leaks out of the vein and into nearby tissue.   Changes in your liver function. Your doctor will check your liver function as needed.   This drug may cause an increased risk of developing a second cancer  Allergic Reaction Serious allergic reactions, including anaphylaxis are rare. While you are getting this drug in your vein (IV), tell your nurse right away if you have any of these symptoms of an allergic reaction:  Trouble catching your breath   Feeling like your tongue or throat are swelling   Feeling your heart beat quickly or in a not normal way (palpitations)   Feeling dizzy or lightheaded   Flushing, itching, rash, and/or hives  Treating Side Effects  Drink 6-8 cups of fluids every day unless your doctor has told you to limit your fluid intake due to some other health problem. A cup is 8 ounces of fluid. If you vomit or have diarrhea, you should drink more fluids so that you do not become dehydrated (lack water in the body due to losing too much fluid).   Ask your doctor or nurse about medicine that is available to help stop or lessen nausea, throwing up, and/or loose bowel movements   Wear dark sunglasses and use sunscreen with SPF 30 or higher when you are outdoors even for a short time. Cover up when you are out in the sun. Wear wide-brimmed hats, long-sleeved shirts, and pants. Keep your neck, chest, and back covered.   Mouth care is very important. Your mouth care should consist of routine, gentle cleaning of your teeth or dentures and rinsing your mouth with a mixture of 1/2 teaspoon of salt in 8 ounces of water or  teaspoon of baking soda in 8 ounces of  water. This should be done at least after each meal and at bedtime.   If you have mouth sores, avoid mouthwash that has alcohol. Avoid alcohol and smoking because they can bother your mouth and throat.   Talk with your nurse about getting a wig before you lose your hair. Also, call the [employer] at 800-ACS-2345 to find out information about the "Look Good, Feel Better" program close to where you live. It is a free program where women getting chemotherapy can learn about wigs, turbans and scarves as well as makeup techniques and skin and nail care.   While you are getting this drug, please tell your nurse right away if you have any pain, redness, or swelling at the site of the IV infusion.  Food and Drug Interactions There are no known interactions of doxorubicin with food. This drug may interact with other medicines. Tell your doctor and pharmacist about all the medicines and dietary supplements (vitamins, minerals, herbs and others) that you are taking at this time. The safety and use of dietary supplements and alternative diets are often not known. Using these might affect your cancer or interfere with your treatment. Until more is known, you should not use dietary supplements or alternative diets without your cancer doctor's help.  When to Call the Doctor Call your doctor or nurse right away if you have any of these symptoms:  Fever of 100.4 F (38 C) or above   Chills   Easy bruising or bleeding   Wheezing or trouble breathing   Rash or itching   Feeling dizzy or lightheaded   Feeling that your heart is beating in a fast or not normal way (palpitations)   Loose  bowel movements (diarrhea) more than 4 times a day or diarrhea with weakness or feeling lightheaded   Nausea that stops you from eating or drinking   Throwing up more than 3 times a day   Signs of liver problems: dark urine, pale bowel movements, bad stomach pain, feeling very tired and weak, unusual itching,  or yellowing of the eyes or skin,   During the IV infusion, if you have pain, redness, or swelling at the site of the IV infusion, please tell your nurse right away  Call your doctor or nurse as soon as possible if any of these symptoms happen:  Decreased urine   Pain in your mouth or throat that makes it hard to eat or drink   Nausea and throwing up that is not relieved by prescribed medicines   Rash that is not relieved by prescribed medicines   Swelling of legs, ankles, or feet   Weight gain of 5 pounds in one week (fluid retention)   Lasting loss of appetite or rapid weight loss of five pounds in a week   Fatigue that interferes with your daily activities   Extreme weakness that interferes with normal activities  Sexual Problems and Reproduction Concerns Infertility warning: Sexual problems and reproduction concerns may happen. In both men and women, this drug may affect your ability to have children. This cannot be determined before your treatment. Talk with your doctor or nurse if you plan to have children. Ask for information on sperm or egg banking.   In men, this drug may interfere with your ability to make sperm, but it should not change your ability to have sexual relations.   In women, menstrual bleeding may become irregular or stop while you are getting this drug. Do not assume that you cannot become pregnant if you do not have a menstrual period.    SELF CARE ACTIVITIES WHILE RECEIVING CHEMOTHERAPY:  Hydration Increase your fluid intake 48 hours prior to treatment and drink at least 8 to 12 cups (64 ounces) of water/decaffeinated beverages per day after treatment. You can still have your cup of coffee or soda but these beverages do not count as part of your 8 to 12 cups that you need to drink daily. No alcohol intake.  Medications Continue taking your normal prescription medication as prescribed.  If you start any new herbal or new supplements please let us know  first to make sure it is safe.  Mouth Care Have teeth cleaned professionally before starting treatment. Keep dentures and partial plates clean. Use soft toothbrush and do not use mouthwashes that contain alcohol. Biotene is a good mouthwash that is available at most pharmacies or may be ordered by calling [phone]. Use warm salt water gargles (1 teaspoon salt per 1 quart warm water) before and after meals and at bedtime. If you need dental work, please let the doctor know before you go for your appointment so that we can coordinate the best possible time for you in regards to your chemo regimen. You need to also let your dentist know that you are actively taking chemo. We may need to do labs prior to your dental appointment.  Skin Care Always use sunscreen that has not expired and with SPF (Sun Protection Factor) of 50 or higher. Wear hats to protect your head from the sun. Remember to use sunscreen on your hands, ears, face, & feet.  Use good moisturizing lotions such as udder cream, eucerin, or even Vaseline. Some chemotherapies can cause  dry skin, color changes in your skin and nails.    Avoid long, hot showers or baths. Use gentle, fragrance-free soaps and laundry detergent. Use moisturizers, preferably creams or ointments rather than lotions because the thicker consistency is better at preventing skin dehydration. Apply the cream or ointment within 15 minutes of showering. Reapply moisturizer at night, and moisturize your hands every time after you wash them.  Hair Loss (if your doctor says your hair will fall out)  If your doctor says that your hair is likely to fall out, decide before you begin chemo whether you want to wear a wig. You may want to shop before treatment to match your hair color. Hats, turbans, and scarves can also camouflage hair loss, although some people prefer to leave their heads uncovered. If you go bare-headed outdoors, be sure to use sunscreen on your scalp. Cut  your hair short. It eases the inconvenience of shedding lots of hair, but it also can reduce the emotional impact of watching your hair fall out. Don't perm or color your hair during chemotherapy. Those chemical treatments are already damaging to hair and can enhance hair loss. Once your chemo treatments are done and your hair has grown back, it's OK to resume dyeing or perming hair.  With chemotherapy, hair loss is almost always temporary. But when it grows back, it may be a different color or texture. In older adults who still had hair color before chemotherapy, the new growth may be completely gray.  Often, new hair is very fine and soft.  Infection Prevention Please wash your hands for at least 30 seconds using warm soapy water. Handwashing is the #1 way to prevent the spread of germs. Stay away from sick people or people who are getting over a cold. If you develop respiratory systems such as green/yellow mucus production or productive cough or persistent cough let us know and we will see if you need an antibiotic. It is a good idea to keep a pair of gloves on when going into grocery stores/[employer] to decrease your risk of coming into contact with germs on the carts, etc. Carry alcohol hand gel with you at all times and use it frequently if out in public. If your temperature reaches 100.4 or higher please call the clinic and let us know.  If it is after hours or on the weekend please go to the ER if your temperature is over 100.4.  Please have your own personal thermometer at home to use.    Sex and bodily fluids If you are going to have sex, a condom must be used to protect the person that isn't taking chemotherapy. Chemo can decrease your libido (sex drive). For a few days after chemotherapy, chemotherapy can be excreted through your bodily fluids.  When using the toilet please close the lid and flush the toilet twice.  Do this for a few day after you have had chemotherapy.   Effects of chemotherapy  on your sex life Some changes are simple and won't last long. They won't affect your sex life permanently.  Sometimes you may feel: too tired not strong enough to be very active sick or sore  not in the mood anxious or low  Your anxiety might not seem related to sex. For example, you may be worried about the cancer and how your treatment is going. Or you may be worried about money, or about how you family are coping with your illness.  These things can cause stress, which  can affect your interest in sex. It's important to talk to your partner about how you feel.  Remember - the changes to your sex life don't usually last long. There's usually no medical reason to stop having sex during chemo. The drugs won't have any long term physical effects on your performance or enjoyment of sex. Cancer can't be passed on to your partner during sex  Contraception It's important to use reliable contraception during treatment. Avoid getting pregnant while you or your partner are having chemotherapy. This is because the drugs may harm the baby. Sometimes chemotherapy drugs can leave a man or woman infertile.  This means you would not be able to have children in the future. You might want to talk to someone about permanent infertility. It can be very difficult to learn that you may no longer be able to have children. Some people find counselling helpful. There might be ways to preserve your fertility, although this is easier for men than for women. You may want to speak to a fertility expert. You can talk about sperm banking or harvesting your eggs. You can also ask about other fertility options, such as donor eggs. If you have or have had breast cancer, your doctor might advise you not to take the contraceptive pill. This is because the hormones in it might affect the cancer. It is not known for sure whether or not chemotherapy drugs can be passed on through semen or secretions from the vagina. Because of this some  doctors advise people to use a barrier method if you have sex during treatment. This applies to vaginal, anal or oral sex. Generally, doctors advise a barrier method only for the time you are actually having the treatment and for about a week after your treatment. Advice like this can be worrying, but this does not mean that you have to avoid being intimate with your partner. You can still have close contact with your partner and continue to enjoy sex.  Animals If you have cats or birds we just ask that you not change the litter or change the cage.  Please have someone else do this for you while you are on chemotherapy.   Food Safety During and After Cancer Treatment Food safety is important for people both during and after cancer treatment. Cancer and cancer treatments, such as chemotherapy, radiation therapy, and stem cell/bone marrow transplantation, often weaken the immune system. This makes it harder for your body to protect itself from foodborne illness, also called food poisoning. Foodborne illness is caused by eating food that contains harmful bacteria, parasites, or viruses.  Foods to avoid Some foods have a higher risk of becoming tainted with bacteria. These include: Unwashed fresh fruit and vegetables, especially leafy vegetables that can hide dirt and other contaminants Raw sprouts, such as alfalfa sprouts Raw or undercooked beef, especially ground beef, or other raw or undercooked meat and poultry Fatty, fried, or spicy foods immediately before or after treatment.  These can sit heavy on your stomach and make you feel nauseous. Raw or undercooked shellfish, such as oysters. Sushi and sashimi, which often contain raw fish.  Unpasteurized beverages, such as unpasteurized fruit juices, raw milk, raw yogurt, or cider Undercooked eggs, such as soft boiled, over easy, and poached; raw, unpasteurized eggs; or foods made with raw egg, such as homemade raw cookie dough and homemade  mayonnaise  Simple steps for food safety  Shop smart. Do not buy food stored or displayed in an unclean area. Do  not buy bruised or damaged fruits or vegetables. Do not buy cans that have cracks, dents, or bulges. Pick up foods that can spoil at the end of your shopping trip and store them in a cooler on the way home.  Prepare and clean up foods carefully. Rinse all fresh fruits and vegetables under running water, and dry them with a clean towel or paper towel. Clean the top of cans before opening them. After preparing food, wash your hands for 20 seconds with hot water and soap. Pay special attention to areas between fingers and under nails. Clean your utensils and dishes with hot water and soap. Disinfect your kitchen and cutting boards using 1 teaspoon of liquid, unscented bleach mixed into 1 quart of water.    Dispose of old food. Eat canned and packaged food before its expiration date (the "use by" or "best before" date). Consume refrigerated leftovers within 3 to 4 days. After that time, throw out the food. Even if the food does not smell or look spoiled, it still may be unsafe. Some bacteria, such as Listeria, can grow even on foods stored in the refrigerator if they are kept for too long.  Take precautions when eating out. At restaurants, avoid buffets and salad bars where food sits out for a long time and comes in contact with many people. Food can become contaminated when someone with a virus, often a norovirus, or another "bug" handles it. Put any leftover food in a "to-go" container yourself, rather than having the server do it. And, refrigerate leftovers as soon as you get home. Choose restaurants that are clean and that are willing to prepare your food as you order it cooked.   AT HOME MEDICATIONS:                                                                                                                                                                 Compazine/Prochlorperazine '10mg'$  tablet. Take 1 tablet every 6 hours as needed for nausea/vomiting. (This can make you sleepy)   EMLA cream. Apply a quarter size amount to port site 1 hour prior to chemo. Do not rub in. Cover with plastic wrap.    Diarrhea Sheet   If you are having loose stools/diarrhea, please purchase Imodium and begin taking as outlined:  At the first sign of poorly formed or loose stools you should begin taking Imodium (loperamide) 2 mg capsules.  Take two tablets ('4mg'$ ) followed by one tablet ('2mg'$ ) every 2 hours - DO NOT EXCEED 8 tablets in 24 hours.  If it is bedtime and you are having loose stools, take 2 tablets at bedtime, then 2 tablets every 4 hours until morning.   Always call the [facility] if you are having loose stools/diarrhea that you  can't get under control.  Loose stools/diarrhea leads to dehydration (loss of water) in your body.  We have other options of trying to get the loose stools/diarrhea to stop but you must let us know!   Constipation Sheet  Colace - 100 mg capsules - take 2 capsules daily.  If this doesn't help then you can increase to 2 capsules twice daily.  Please call if the above does not work for you. Do not go more than 2 days without a bowel movement.  It is very important that you do not become constipated.  It will make you feel sick to your stomach (nausea) and can cause abdominal pain and vomiting.  Nausea Sheet   Compazine/Prochlorperazine 10mg  tablet. Take 1 tablet every 6 hours as needed for nausea/vomiting (This can make you drowsy).  If you are having persistent nausea (nausea that does not stop) please call the [facility] and let us know the amount of nausea that you are experiencing.  If you begin to vomit, you need to call the [facility] and if it is the weekend and you have vomited more than one time and can't get it to stop-go to the Emergency Room.  Persistent nausea/vomiting can lead to dehydration (loss of fluid in  your body) and will make you feel very weak and unwell. Ice chips, sips of clear liquids, foods that are at room temperature, crackers, and toast tend to be better tolerated.   SYMPTOMS TO REPORT AS SOON AS POSSIBLE AFTER TREATMENT:  FEVER GREATER THAN 100.4 F  CHILLS WITH OR WITHOUT FEVER  NAUSEA AND VOMITING THAT IS NOT CONTROLLED WITH YOUR NAUSEA MEDICATION  UNUSUAL SHORTNESS OF BREATH  UNUSUAL BRUISING OR BLEEDING  TENDERNESS IN MOUTH AND THROAT WITH OR WITHOUT PRESENCE OF ULCERS  URINARY PROBLEMS  BOWEL PROBLEMS  UNUSUAL RASH      Wear comfortable clothing and clothing appropriate for easy access to any Portacath or PICC line. Let us know if there is anything that we can do to make your therapy better!    What to do if you need assistance after hours or on the weekends: CALL [phone].  HOLD on the line, do not hang up.  You will hear multiple messages but at the end you will be connected with a nurse triage line.  They will contact the doctor if necessary.  Most of the time they will be able to assist you.  Do not call the hospital operator.      I have been informed and understand all of the instructions given to me and have received a copy. I have been instructed to call the clinic [phone] or my family physician as soon as possible for continued medical care, if indicated. I do not have any more questions at this time but understand that I may call the [facility] or the Patient Navigator at [phone] during office hours should I have questions or need assistance in obtaining follow-up care.

## 2021-02-27 ENCOUNTER — Inpatient Hospital Stay (HOSPITAL_COMMUNITY): Payer: Medicare Other

## 2021-02-27 ENCOUNTER — Other Ambulatory Visit: Payer: Self-pay

## 2021-02-27 VITALS — BP 132/78 | HR 56 | Temp 98.3°F | Resp 16

## 2021-02-27 DIAGNOSIS — C50912 Malignant neoplasm of unspecified site of left female breast: Secondary | ICD-10-CM

## 2021-02-27 DIAGNOSIS — Z95828 Presence of other vascular implants and grafts: Secondary | ICD-10-CM

## 2021-02-27 DIAGNOSIS — Z79899 Other long term (current) drug therapy: Secondary | ICD-10-CM | POA: Diagnosis not present

## 2021-02-27 DIAGNOSIS — Z5111 Encounter for antineoplastic chemotherapy: Secondary | ICD-10-CM | POA: Diagnosis not present

## 2021-02-27 DIAGNOSIS — I10 Essential (primary) hypertension: Secondary | ICD-10-CM | POA: Diagnosis not present

## 2021-02-27 DIAGNOSIS — E039 Hypothyroidism, unspecified: Secondary | ICD-10-CM | POA: Diagnosis not present

## 2021-02-27 DIAGNOSIS — K529 Noninfective gastroenteritis and colitis, unspecified: Secondary | ICD-10-CM | POA: Diagnosis not present

## 2021-02-27 LAB — CBC WITH DIFFERENTIAL/PLATELET
Abs Immature Granulocytes: 0.01 10*3/uL (ref 0.00–0.07)
Basophils Absolute: 0 10*3/uL (ref 0.0–0.1)
Basophils Relative: 0 %
Eosinophils Absolute: 0.1 10*3/uL (ref 0.0–0.5)
Eosinophils Relative: 3 %
HCT: 37.4 % (ref 36.0–46.0)
Hemoglobin: 12.3 g/dL (ref 12.0–15.0)
Immature Granulocytes: 0 %
Lymphocytes Relative: 35 %
Lymphs Abs: 1.2 10*3/uL (ref 0.7–4.0)
MCH: 32.3 pg (ref 26.0–34.0)
MCHC: 32.9 g/dL (ref 30.0–36.0)
MCV: 98.2 fL (ref 80.0–100.0)
Monocytes Absolute: 0.4 10*3/uL (ref 0.1–1.0)
Monocytes Relative: 12 %
Neutro Abs: 1.7 10*3/uL (ref 1.7–7.7)
Neutrophils Relative %: 50 %
Platelets: 246 10*3/uL (ref 150–400)
RBC: 3.81 MIL/uL — ABNORMAL LOW (ref 3.87–5.11)
RDW: 11.9 % (ref 11.5–15.5)
WBC: 3.5 10*3/uL — ABNORMAL LOW (ref 4.0–10.5)
nRBC: 0 % (ref 0.0–0.2)

## 2021-02-27 LAB — COMPREHENSIVE METABOLIC PANEL
ALT: 14 U/L (ref 0–44)
AST: 17 U/L (ref 15–41)
Albumin: 3.8 g/dL (ref 3.5–5.0)
Alkaline Phosphatase: 68 U/L (ref 38–126)
Anion gap: 6 (ref 5–15)
BUN: 16 mg/dL (ref 8–23)
CO2: 28 mmol/L (ref 22–32)
Calcium: 8.9 mg/dL (ref 8.9–10.3)
Chloride: 105 mmol/L (ref 98–111)
Creatinine, Ser: 0.89 mg/dL (ref 0.44–1.00)
GFR, Estimated: >60 mL/min (ref >60)
Glucose, Bld: 111 mg/dL — ABNORMAL HIGH (ref 70–99)
Potassium: 3.8 mmol/L (ref 3.5–5.1)
Sodium: 139 mmol/L (ref 135–145)
Total Bilirubin: 0.5 mg/dL (ref 0.3–1.2)
Total Protein: 7 g/dL (ref 6.5–8.1)

## 2021-02-27 LAB — MAGNESIUM: Magnesium: 1.7 mg/dL (ref 1.7–2.4)

## 2021-02-27 LAB — TSH: TSH: 0.315 u[IU]/mL — ABNORMAL LOW (ref 0.350–4.500)

## 2021-02-27 MED ORDER — SODIUM CHLORIDE 0.9 % IV SOLN
200.0000 mg | Freq: Once | INTRAVENOUS | Status: AC
  Administered 2021-02-27: 200 mg via INTRAVENOUS
  Filled 2021-02-27: qty 8

## 2021-02-27 MED ORDER — DIPHENHYDRAMINE HCL 50 MG/ML IJ SOLN
50.0000 mg | Freq: Once | INTRAMUSCULAR | Status: AC
  Administered 2021-02-27: 50 mg via INTRAVENOUS
  Filled 2021-02-27: qty 1

## 2021-02-27 MED ORDER — SODIUM CHLORIDE 0.9 % IV SOLN
135.3000 mg | Freq: Once | INTRAVENOUS | Status: AC
  Administered 2021-02-27: 140 mg via INTRAVENOUS
  Filled 2021-02-27: qty 14

## 2021-02-27 MED ORDER — HEPARIN SOD (PORK) LOCK FLUSH 100 UNIT/ML IV SOLN
500.0000 [IU] | Freq: Once | INTRAVENOUS | Status: AC | PRN
  Administered 2021-02-27: 500 [IU]

## 2021-02-27 MED ORDER — FAMOTIDINE 20 MG IN NS 100 ML IVPB
20.0000 mg | Freq: Once | INTRAVENOUS | Status: AC
Start: 2021-02-27 — End: 2021-02-27
  Administered 2021-02-27: 20 mg via INTRAVENOUS
  Filled 2021-02-27: qty 20

## 2021-02-27 MED ORDER — SODIUM CHLORIDE 0.9 % IV SOLN
Freq: Once | INTRAVENOUS | Status: AC
Start: 2021-02-27 — End: 2021-02-27

## 2021-02-27 MED ORDER — SODIUM CHLORIDE 0.9 % IV SOLN
10.0000 mg | Freq: Once | INTRAVENOUS | Status: AC
  Administered 2021-02-27: 10 mg via INTRAVENOUS
  Filled 2021-02-27: qty 10

## 2021-02-27 MED ORDER — SODIUM CHLORIDE 0.9% FLUSH
10.0000 mL | INTRAVENOUS | Status: DC | PRN
  Administered 2021-02-27: 10 mL

## 2021-02-27 MED ORDER — SODIUM CHLORIDE 0.9 % IV SOLN
80.0000 mg/m2 | Freq: Once | INTRAVENOUS | Status: AC
  Administered 2021-02-27: 156 mg via INTRAVENOUS
  Filled 2021-02-27: qty 26

## 2021-02-27 MED ORDER — PALONOSETRON HCL INJECTION 0.25 MG/5ML
0.2500 mg | Freq: Once | INTRAVENOUS | Status: AC
  Administered 2021-02-27: 0.25 mg via INTRAVENOUS
  Filled 2021-02-27: qty 5

## 2021-02-27 NOTE — Progress Notes (Signed)
Pharmacist Chemotherapy Monitoring - Initial Assessment    Anticipated start date: 02/27/21   Regimen:  Are orders appropriate based on the patient's diagnosis, regimen, and cycle? Yes Does the plan date match the patient's scheduled date? Yes Is the sequencing of drugs appropriate? Yes Are the premedications appropriate for the patient's regimen? Yes Prior Authorization for treatment is: Approved If applicable, is the correct biosimilar selected based on the patient's insurance? not applicable  Organ Function and Labs: Are dose adjustments needed based on the patient's renal function, hepatic function, or hematologic function? No Are appropriate labs ordered prior to the start of patient's treatment? Yes Other organ system assessment, if indicated: N/A The following baseline labs, if indicated, have been ordered: pembrolizumab: baseline TSH +/- T4  Dose Assessment: Are the drug doses appropriate? Yes Are the following correct: Drug concentrations Yes IV fluid compatible with drug Yes Administration routes Yes Timing of therapy Yes If applicable, does the patient have documented access for treatment and/or plans for port-a-cath placement? yes If applicable, have lifetime cumulative doses been properly documented and assessed? yes Lifetime Dose Tracking  No doses have been documented on this patient for the following tracked chemicals: Doxorubicin, Epirubicin, Idarubicin, Daunorubicin, Mitoxantrone, Bleomycin, Oxaliplatin, Carboplatin, Liposomal Doxorubicin    Toxicity Monitoring/Prevention: The patient has the following take home antiemetics prescribed: Prochlorperazine The patient has the following take home medications prescribed: N/A Medication allergies and previous infusion related reactions, if applicable, have been reviewed and addressed. Yes The patient's current medication list has been assessed for drug-drug interactions with their chemotherapy regimen. no significant  drug-drug interactions were identified on review.  Order Review: Are the treatment plan orders signed? No Is the patient scheduled to see a provider prior to their treatment? No  I verify that I have reviewed each item in the above checklist and answered each question accordingly.  Wynona Neat, [place], 02/27/2021  9:30 AM

## 2021-02-27 NOTE — Progress Notes (Signed)
Treatment given per orders. Patient tolerated it well without problems. Vitals stable and discharged home from clinic ambulatory. Follow up as scheduled.  

## 2021-02-27 NOTE — Progress Notes (Unsigned)
Chemotherapy/immunotherapy education packet given and discussed with pt in detail.  Discussed diagnosis, staging, tx regimen, and intent of tx.  Reviewed chemotherapy/immunotherapy medications and side effects, as well as pre-medications.  Instructed on how to manage side effects at home, and when to call the clinic.  Importance of fever/chills discussed with pt. Discussed precautions to implement at home after receiving tx, as well as self care strategies. Phone numbers provided for clinic during regular working hours, also how to reach the clinic after hours and on weekends. Pt provided the opportunity to ask questions - all questions answered to pt's satisfaction.    

## 2021-02-27 NOTE — Patient Instructions (Signed)
[facility]  Discharge Instructions: Thank you for choosing [facility] to provide your oncology and hematology care.  If you have a lab appointment with the Cancer Center, please come in thru the Main Entrance and check in at the main information desk.  Wear comfortable clothing and clothing appropriate for easy access to any Portacath or PICC line.   We strive to give you quality time with your provider. You may need to reschedule your appointment if you arrive late (15 or more minutes).  Arriving late affects you and other patients whose appointments are after yours.  Also, if you miss three or more appointments without notifying the office, you may be dismissed from the clinic at the provider's discretion.      For prescription refill requests, have your pharmacy contact our office and allow 72 hours for refills to be completed.    Today you received the following chemotherapy and/or immunotherapy agents    To help prevent nausea and vomiting after your treatment, we encourage you to take your nausea medication as directed.  BELOW ARE SYMPTOMS THAT SHOULD BE REPORTED IMMEDIATELY: . *FEVER GREATER THAN 100.4 F (38 C) OR HIGHER . *CHILLS OR SWEATING . *NAUSEA AND VOMITING THAT IS NOT CONTROLLED WITH YOUR NAUSEA MEDICATION . *UNUSUAL SHORTNESS OF BREATH . *UNUSUAL BRUISING OR BLEEDING . *URINARY PROBLEMS (pain or burning when urinating, or frequent urination) . *BOWEL PROBLEMS (unusual diarrhea, constipation, pain near the anus) . TENDERNESS IN MOUTH AND THROAT WITH OR WITHOUT PRESENCE OF ULCERS (sore throat, sores in mouth, or a toothache) . UNUSUAL RASH, SWELLING OR PAIN  . UNUSUAL VAGINAL DISCHARGE OR ITCHING   Items with * indicate a potential emergency and should be followed up as soon as possible or go to the Emergency Department if any problems should occur.  Please show the CHEMOTHERAPY ALERT CARD or IMMUNOTHERAPY ALERT CARD at check-in to the  Emergency Department and triage nurse.  Should you have questions after your visit or need to cancel or reschedule your appointment, please contact [facility] [phone]  and follow the prompts.  Office hours are 8:00 a.m. to 4:30 p.m. Monday - Friday. Please note that voicemails left after 4:00 p.m. may not be returned until the following business day.  We are closed weekends and major holidays. You have access to a nurse at all times for urgent questions. Please call the main number to the clinic [phone] and follow the prompts.  For any non-urgent questions, you may also contact your provider using MyChart. We now offer e-Visits for anyone 18 and older to request care online for non-urgent symptoms. For details visit [email or web address].   Also download the MyChart app! Go to the app store, search "MyChart", open the app, select [facility], and log in with your MyChart username and password.  Due to Covid, a mask is required upon entering the hospital/clinic. If you do not have a mask, one will be given to you upon arrival. For doctor visits, patients may have 1 support person aged 18 or older with them. For treatment visits, patients cannot have anyone with them due to current Covid guidelines and our immunocompromised population.  

## 2021-02-28 ENCOUNTER — Telehealth (HOSPITAL_COMMUNITY): Payer: Self-pay

## 2021-02-28 NOTE — Telephone Encounter (Signed)
24 hour call back- patient is doing good. No questions or concerns.

## 2021-03-02 DIAGNOSIS — K648 Other hemorrhoids: Secondary | ICD-10-CM | POA: Diagnosis not present

## 2021-03-02 DIAGNOSIS — Z79899 Other long term (current) drug therapy: Secondary | ICD-10-CM | POA: Diagnosis not present

## 2021-03-02 DIAGNOSIS — K559 Vascular disorder of intestine, unspecified: Secondary | ICD-10-CM | POA: Diagnosis not present

## 2021-03-02 DIAGNOSIS — K625 Hemorrhage of anus and rectum: Secondary | ICD-10-CM | POA: Diagnosis not present

## 2021-03-02 DIAGNOSIS — E079 Disorder of thyroid, unspecified: Secondary | ICD-10-CM | POA: Diagnosis not present

## 2021-03-02 DIAGNOSIS — I700 Atherosclerosis of aorta: Secondary | ICD-10-CM | POA: Diagnosis not present

## 2021-03-02 DIAGNOSIS — Z79890 Hormone replacement therapy (postmenopausal): Secondary | ICD-10-CM | POA: Diagnosis not present

## 2021-03-02 DIAGNOSIS — C50919 Malignant neoplasm of unspecified site of unspecified female breast: Secondary | ICD-10-CM | POA: Diagnosis not present

## 2021-03-02 DIAGNOSIS — K5730 Diverticulosis of large intestine without perforation or abscess without bleeding: Secondary | ICD-10-CM | POA: Diagnosis not present

## 2021-03-02 DIAGNOSIS — K921 Melena: Secondary | ICD-10-CM | POA: Diagnosis not present

## 2021-03-02 DIAGNOSIS — R112 Nausea with vomiting, unspecified: Secondary | ICD-10-CM | POA: Diagnosis not present

## 2021-03-02 DIAGNOSIS — R1084 Generalized abdominal pain: Secondary | ICD-10-CM | POA: Diagnosis not present

## 2021-03-02 DIAGNOSIS — G629 Polyneuropathy, unspecified: Secondary | ICD-10-CM | POA: Diagnosis not present

## 2021-03-02 DIAGNOSIS — K529 Noninfective gastroenteritis and colitis, unspecified: Secondary | ICD-10-CM | POA: Diagnosis not present

## 2021-03-02 DIAGNOSIS — Z20822 Contact with and (suspected) exposure to covid-19: Secondary | ICD-10-CM | POA: Diagnosis not present

## 2021-03-02 DIAGNOSIS — I10 Essential (primary) hypertension: Secondary | ICD-10-CM | POA: Diagnosis not present

## 2021-03-03 ENCOUNTER — Telehealth (HOSPITAL_COMMUNITY): Payer: Self-pay | Admitting: Surgery

## 2021-03-03 DIAGNOSIS — K529 Noninfective gastroenteritis and colitis, unspecified: Secondary | ICD-10-CM | POA: Diagnosis not present

## 2021-03-03 DIAGNOSIS — C50919 Malignant neoplasm of unspecified site of unspecified female breast: Secondary | ICD-10-CM | POA: Diagnosis not present

## 2021-03-03 DIAGNOSIS — I10 Essential (primary) hypertension: Secondary | ICD-10-CM | POA: Diagnosis not present

## 2021-03-03 NOTE — Telephone Encounter (Signed)
I called the pt to check on her status following her triage phone call yesterday.  She went to the ER in [place] yesterday after waking up with nausea, vomiting, diarrhea, and rectal bleeding.  She was admitted but thinks she will be discharged today.  She stated that she had a CT which showed inflammation in her colon, and she has been given prednisone.  The vomiting and diarrhea have stopped, and she stated that the rectal bleeding has improved.  She stated that the physician in [place] feels like the inflammation is due to her first dose of Keytruda last week.  I notified Tarri Abernethy and Adonis Huguenin.  They stated to leave the pt on the schedule for Thursday.    I called the pt back to let her know to keep her appointment on Thursday, and I also asked her to give the nurse our office fax number so that the CT scan and discharge summary could be faxed to [place].  The pt verbalized understanding of these instructions.

## 2021-03-06 ENCOUNTER — Inpatient Hospital Stay (HOSPITAL_COMMUNITY): Payer: Medicare Other

## 2021-03-06 ENCOUNTER — Encounter (HOSPITAL_COMMUNITY): Payer: Self-pay | Admitting: Hematology

## 2021-03-06 ENCOUNTER — Inpatient Hospital Stay (HOSPITAL_BASED_OUTPATIENT_CLINIC_OR_DEPARTMENT_OTHER): Payer: Medicare Other | Admitting: Hematology

## 2021-03-06 ENCOUNTER — Other Ambulatory Visit: Payer: Self-pay

## 2021-03-06 VITALS — BP 113/76 | HR 70 | Temp 97.2°F | Wt 172.7 lb

## 2021-03-06 DIAGNOSIS — C50912 Malignant neoplasm of unspecified site of left female breast: Secondary | ICD-10-CM

## 2021-03-06 DIAGNOSIS — E039 Hypothyroidism, unspecified: Secondary | ICD-10-CM | POA: Diagnosis not present

## 2021-03-06 DIAGNOSIS — I10 Essential (primary) hypertension: Secondary | ICD-10-CM | POA: Diagnosis not present

## 2021-03-06 DIAGNOSIS — K529 Noninfective gastroenteritis and colitis, unspecified: Secondary | ICD-10-CM | POA: Diagnosis not present

## 2021-03-06 DIAGNOSIS — Z79899 Other long term (current) drug therapy: Secondary | ICD-10-CM | POA: Diagnosis not present

## 2021-03-06 DIAGNOSIS — Z5111 Encounter for antineoplastic chemotherapy: Secondary | ICD-10-CM | POA: Diagnosis not present

## 2021-03-06 LAB — CBC WITH DIFFERENTIAL/PLATELET
Abs Immature Granulocytes: 0.04 10*3/uL (ref 0.00–0.07)
Basophils Absolute: 0 10*3/uL (ref 0.0–0.1)
Basophils Relative: 0 %
Eosinophils Absolute: 0 10*3/uL (ref 0.0–0.5)
Eosinophils Relative: 0 %
HCT: 37.1 % (ref 36.0–46.0)
Hemoglobin: 12.5 g/dL (ref 12.0–15.0)
Immature Granulocytes: 1 %
Lymphocytes Relative: 28 %
Lymphs Abs: 1.7 10*3/uL (ref 0.7–4.0)
MCH: 32.6 pg (ref 26.0–34.0)
MCHC: 33.7 g/dL (ref 30.0–36.0)
MCV: 96.6 fL (ref 80.0–100.0)
Monocytes Absolute: 0.4 10*3/uL (ref 0.1–1.0)
Monocytes Relative: 7 %
Neutro Abs: 3.8 10*3/uL (ref 1.7–7.7)
Neutrophils Relative %: 64 %
Platelets: 281 10*3/uL (ref 150–400)
RBC: 3.84 MIL/uL — ABNORMAL LOW (ref 3.87–5.11)
RDW: 11.8 % (ref 11.5–15.5)
WBC: 5.9 10*3/uL (ref 4.0–10.5)
nRBC: 0 % (ref 0.0–0.2)

## 2021-03-06 LAB — COMPREHENSIVE METABOLIC PANEL
ALT: 77 U/L — ABNORMAL HIGH (ref 0–44)
AST: 49 U/L — ABNORMAL HIGH (ref 15–41)
Albumin: 3.6 g/dL (ref 3.5–5.0)
Alkaline Phosphatase: 54 U/L (ref 38–126)
Anion gap: 7 (ref 5–15)
BUN: 24 mg/dL — ABNORMAL HIGH (ref 8–23)
CO2: 27 mmol/L (ref 22–32)
Calcium: 9 mg/dL (ref 8.9–10.3)
Chloride: 100 mmol/L (ref 98–111)
Creatinine, Ser: 0.81 mg/dL (ref 0.44–1.00)
GFR, Estimated: >60 mL/min (ref >60)
Glucose, Bld: 88 mg/dL (ref 70–99)
Potassium: 3.8 mmol/L (ref 3.5–5.1)
Sodium: 134 mmol/L — ABNORMAL LOW (ref 135–145)
Total Bilirubin: 1 mg/dL (ref 0.3–1.2)
Total Protein: 6.7 g/dL (ref 6.5–8.1)

## 2021-03-06 LAB — MAGNESIUM: Magnesium: 1.9 mg/dL (ref 1.7–2.4)

## 2021-03-06 LAB — TSH: TSH: 0.29 u[IU]/mL — ABNORMAL LOW (ref 0.350–4.500)

## 2021-03-06 MED ORDER — SODIUM CHLORIDE 0.9% FLUSH
10.0000 mL | Freq: Once | INTRAVENOUS | Status: AC
  Administered 2021-03-06: 10 mL via INTRAVENOUS

## 2021-03-06 MED ORDER — SODIUM CHLORIDE 0.9% FLUSH
10.0000 mL | INTRAVENOUS | Status: DC | PRN
  Administered 2021-03-06: 10 mL via INTRAVENOUS

## 2021-03-06 MED ORDER — HEPARIN SOD (PORK) LOCK FLUSH 100 UNIT/ML IV SOLN
500.0000 [IU] | Freq: Once | INTRAVENOUS | Status: AC
  Administered 2021-03-06: 500 [IU] via INTRAVENOUS

## 2021-03-06 MED ORDER — SODIUM CHLORIDE 0.9 % IV SOLN: Freq: Once | INTRAVENOUS | Status: AC

## 2021-03-06 NOTE — Patient Instructions (Signed)
We accessed your port for treatment.

## 2021-03-06 NOTE — Patient Instructions (Signed)
[name]  Discharge Instructions: Thank you for choosing [facility] to provide your oncology and hematology care.  If you have a lab appointment with the [facility], please come in thru the Main Entrance and check in at the main information desk.  Wear comfortable clothing and clothing appropriate for easy access to any Portacath or PICC line.   We strive to give you quality time with your provider. You may need to reschedule your appointment if you arrive late (15 or more minutes).  Arriving late affects you and other patients whose appointments are after yours.  Also, if you miss three or more appointments without notifying the office, you may be dismissed from the clinic at the provider's discretion.      For prescription refill requests, have your pharmacy contact our office and allow 72 hours for refills to be completed.    Today you received fluids      To help prevent nausea and vomiting after your treatment, we encourage you to take your nausea medication as directed.  BELOW ARE SYMPTOMS THAT SHOULD BE REPORTED IMMEDIATELY: *FEVER GREATER THAN 100.4 F (38 C) OR HIGHER *CHILLS OR SWEATING *NAUSEA AND VOMITING THAT IS NOT CONTROLLED WITH YOUR NAUSEA MEDICATION *UNUSUAL SHORTNESS OF BREATH *UNUSUAL BRUISING OR BLEEDING *URINARY PROBLEMS (pain or burning when urinating, or frequent urination) *BOWEL PROBLEMS (unusual diarrhea, constipation, pain near the anus) TENDERNESS IN MOUTH AND THROAT WITH OR WITHOUT PRESENCE OF ULCERS (sore throat, sores in mouth, or a toothache) UNUSUAL RASH, SWELLING OR PAIN  UNUSUAL VAGINAL DISCHARGE OR ITCHING   Items with * indicate a potential emergency and should be followed up as soon as possible or go to the Emergency Department if any problems should occur.  Please show the CHEMOTHERAPY ALERT CARD or IMMUNOTHERAPY ALERT CARD at check-in to the Emergency Department and triage nurse.  Should you have questions after  your visit or need to cancel or reschedule your appointment, please contact [facility] [phone]  and follow the prompts.  Office hours are 8:00 a.m. to 4:30 p.m. Monday - Friday. Please note that voicemails left after 4:00 p.m. may not be returned until the following business day.  We are closed weekends and major holidays. You have access to a nurse at all times for urgent questions. Please call the main number to the clinic [phone] and follow the prompts.  For any non-urgent questions, you may also contact your provider using MyChart. We now offer e-Visits for anyone 68 and older to request care online for non-urgent symptoms. For details visit [email or web address].   Also download the MyChart app! Go to the app store, search "MyChart", open the app, select , and log in with your MyChart username and password.  Due to Covid, a mask is required upon entering the hospital/clinic. If you do not have a mask, one will be given to you upon arrival. For doctor visits, patients may have 1 support person aged 42 or older with them. For treatment visits, patients cannot have anyone with them due to current Covid guidelines and our immunocompromised population.

## 2021-03-06 NOTE — Progress Notes (Signed)
Patient seen and examined today by Dr. Burr Medico. No chemotherapy today. 1L NS to be given over 2 hours per Dr. Burr Medico. Primary RN and pharmacy made aware.

## 2021-03-06 NOTE — Progress Notes (Signed)
Patient here today for treatment.  Port accessed for treatment, labs drawn.  Patient will see physician before treatment.  Patient remained stable during access.

## 2021-03-06 NOTE — Progress Notes (Signed)
[place]   [phone] [phone]   Clinic Follow up Note   Patient Care Team: Neale Burly, MD as PCP - General (Internal Medicine) Brien Mates, RN as Oncology Nurse Navigator (Oncology)  Date of Service:  03/06/2021  CHIEF COMPLAINT: f/u of left breast cancer, post hospital discharge  SUMMARY OF ONCOLOGIC HISTORY: Oncology History  Invasive lobular carcinoma of left breast in female [facility])  01/23/2021 Initial Diagnosis   Invasive lobular carcinoma of left breast in female [facility])   02/19/2021 Genetic Testing   Negative genetic testing on the CancerNext-Expanded+RNAinsight panel.  SMARCB1 VUS identified.  The CancerNext-Expanded gene panel offered by [facility] and includes sequencing and rearrangement analysis for the following 77 genes: AIP, ALK, APC*, ATM*, AXIN2, BAP1, BARD1, BLM, BMPR1A, BRCA1*, BRCA2*, BRIP1*, CDC73, CDH1*, CDK4, CDKN1B, CDKN2A, CHEK2*, CTNNA1, DICER1, FANCC, FH, FLCN, GALNT12, KIF1B, LZTR1, MAX, MEN1, MET, MLH1*, MSH2*, MSH3, MSH6*, MUTYH*, NBN, NF1*, NF2, NTHL1, PALB2*, PHOX2B, PMS2*, POT1, PRKAR1A, PTCH1, PTEN*, RAD51C*, RAD51D*, RB1, RECQL, RET, SDHA, SDHAF2, SDHB, SDHC, SDHD, SMAD4, SMARCA4, SMARCB1, SMARCE1, STK11, SUFU, TMEM127, TP53*, TSC1, TSC2, VHL and XRCC2 (sequencing and deletion/duplication); EGFR, EGLN1, HOXB13, KIT, MITF, PDGFRA, POLD1, and POLE (sequencing only); EPCAM and GREM1 (deletion/duplication only). DNA and RNA analyses performed for * genes. The report date is February 19, 2021.   02/27/2021 -  Chemotherapy    Patient is on Treatment Plan: BREAST PEMBROLIZUMAB + CARBOPLATIN D1,8,15+ PACLITAXEL D1,8,15 Q21D X 4 CYCLES / PEMBROLIZUMAB + AC Q21D X 4 CYCLES          CURRENT THERAPY:  Weekly carboplatin, paclitaxel, and pembrolizumab, starting 02/27/21  INTERVAL HISTORY:  Denise [name] is here for a follow up of breast cancer. She was last seen by Dr. Delton Coombes on 02/24/21. She presents to the  clinic alone. She reports a history of diverticulitis, which has caused her stomach/colon to be sensitive. She notes she took Colace following treatment. She reports on 03/02/21 she experienced burning in her stomach and was in the bathroom for 6 hours vomiting/with rectal bleeding. She was given prednisone and completed her prescription last night. She notes the burning has improved, but she has not had a bowel movement since discharge from the hospital. She is working on getting back to eating normally.  All other systems were reviewed with the patient and are negative.  MEDICAL HISTORY:  Past Medical History:  Diagnosis Date   Asthma    as child   Cancer ([place]) 12/2020   left breast IMC   Complication of anesthesia    pateitn states,' I coded when I had my D&Cmany years ago.   Family history of breast cancer    Hypertension    Hypothyroidism    PONV (postoperative nausea and vomiting)    Port-A-Cath in place 02/26/2021    SURGICAL HISTORY: Past Surgical History:  Procedure Laterality Date   ABDOMINAL HYSTERECTOMY     BIOPSY  01/17/2018   Procedure: BIOPSY;  Surgeon: Rogene Houston, MD;  [facility];  Service: Endoscopy;;  duodenum,gastric   CHOLECYSTECTOMY     COLONOSCOPY WITH PROPOFOL N/A 01/17/2018   Procedure: COLONOSCOPY WITH PROPOFOL;  Surgeon: Rogene Houston, MD;  [facility];  Service: Endoscopy;  Laterality: N/A;  7:30   DILATION AND CURETTAGE OF UTERUS     ESOPHAGOGASTRODUODENOSCOPY (EGD) WITH PROPOFOL N/A 01/17/2018   Procedure: ESOPHAGOGASTRODUODENOSCOPY (EGD) WITH PROPOFOL;  Surgeon: Rogene Houston, MD;  [facility];  Service: Endoscopy;  Laterality: N/A;   POLYPECTOMY  01/17/2018   Procedure: POLYPECTOMY;  Surgeon: Rogene Houston, MD;  [facility];  Service: Endoscopy;;  transverse colon, cecal   PORTACATH PLACEMENT Right 02/17/2021   Procedure: INSERTION PORT-A-CATH;  Surgeon: Coralie Keens, MD;  Location: [place];  Service: General;  Laterality: Right;    I have reviewed the social history and family history with the patient and they are unchanged from previous note.  ALLERGIES:  is allergic to exforge [amlodipine besylate-valsartan], cheese, strawberry extract, tramadol hcl, and yeast-related products.  MEDICATIONS:  Current Outpatient Medications  Medication Sig Dispense Refill   Acetaminophen (TYLENOL PO) Take 1 tablet by mouth as needed.     albuterol (VENTOLIN HFA) 108 (90 Base) MCG/ACT inhaler Inhale into the lungs.     CARBOPLATIN IV Inject into the vein once a week.     gabapentin (NEURONTIN) 300 MG capsule Take 1 capsule by mouth at bedtime.     hydrochlorothiazide (MICROZIDE) 12.5 MG capsule Take 12.5 mg by mouth daily.     levothyroxine (SYNTHROID) 75 MCG tablet Take 100 mcg by mouth daily before breakfast.     lisinopril (PRINIVIL,ZESTRIL) 40 MG tablet Take 40 mg by mouth daily.     Melatonin 5 MG TABS Take 5 mg by mouth. As needed for sleep     Omega-3 Fatty Acids (FISH OIL PO) Take by mouth.     PACLITAXEL IV Inject into the vein once a week.     Pembrolizumab (KEYTRUDA IV) Inject into the vein every 21 ( twenty-one) days.     prochlorperazine (COMPAZINE) 10 MG tablet Take 1 tablet (10 mg total) by mouth every 6 (six) hours as needed (Nausea or vomiting). 30 tablet 1   psyllium (METAMUCIL) 58.6 % packet Take by mouth.     lidocaine-prilocaine (EMLA) cream Apply a small amount to port a cath site and cover with plastic wrap 1 hour prior to infusion appointments (Patient not taking: Reported on 03/06/2021) 30 g 3   No current facility-administered medications for this visit.    PHYSICAL EXAMINATION: ECOG PERFORMANCE STATUS: 2 - Symptomatic, <50% confined to bed  Vitals:   03/06/21 0855  BP: 113/76  Pulse: 70  Temp: (!) 97.2 F (36.2 C)  SpO2: 98%   Filed Weights   03/06/21 0855  Weight: 172 lb 11.2 oz (78.3 kg)    GENERAL:alert, no distress and  comfortable SKIN: skin color, texture, turgor are normal, no rashes or significant lesions EYES: normal, Conjunctiva are pink and non-injected, sclera clear NECK: supple, thyroid normal size, non-tender, without nodularity LYMPH:  no palpable lymphadenopathy in the cervical, axillary  LUNGS: clear to auscultation and percussion with normal breathing effort HEART: regular rate & rhythm and no murmurs and no lower extremity edema ABDOMEN:abdomen soft, non-tender and normal bowel sounds Musculoskeletal:no cyanosis of digits and no clubbing  NEURO: alert & oriented x 3 with fluent speech, no focal motor/sensory deficits  LABORATORY DATA:  I have reviewed the data as listed CBC Latest Ref Rng & Units 03/06/2021 02/27/2021 01/13/2018  WBC 4.0 - 10.5 K/uL 5.9 3.5(L) 5.5  Hemoglobin 12.0 - 15.0 g/dL 12.5 12.3 13.1  Hematocrit 36.0 - 46.0 % 37.1 37.4 40.4  Platelets 150 - 400 K/uL 281 246 313     CMP Latest Ref Rng & Units 03/06/2021 02/27/2021 02/12/2021  Glucose 70 - 99 mg/dL 88 111(H) 115(H)  BUN 8 - 23 mg/dL 24(H) 16 18  Creatinine 0.44 - 1.00 mg/dL  0.81 0.89 0.88  Sodium 135 - 145 mmol/L 134(L) 139 137  Potassium 3.5 - 5.1 mmol/L 3.8 3.8 4.0  Chloride 98 - 111 mmol/L 100 105 104  CO2 22 - 32 mmol/L '27 28 25  ' Calcium 8.9 - 10.3 mg/dL 9.0 8.9 9.1  Total Protein 6.5 - 8.1 g/dL 6.7 7.0 -  Total Bilirubin 0.3 - 1.2 mg/dL 1.0 0.5 -  Alkaline Phos 38 - 126 U/L 54 68 -  AST 15 - 41 U/L 49(H) 17 -  ALT 0 - 44 U/L 77(H) 14 -      RADIOGRAPHIC STUDIES: I have personally reviewed the radiological images as listed and agreed with the findings in the report. No results found.   ASSESSMENT & PLAN:  Denise [name] is a 70 y.o. female with   1.  Colitis after first cycle chemo and Keytruda  --She developed colitis on 03/02/21 following treatment and was seen in the ED. She was treated with a short course of prednisone, last dose last night, 03/05/21. -She notes she has not had a bowel movement  since discharge from the hospital on 03/03/21. -I recommended she try half dose Miralax for her constipation.  2. Stage T3N3c (stage IIIc) triple negative invasive lobular carcinoma of the left breast: - She felt lump in her left breast for more than 6 months, but thought it was secondary to fibrocystic disease which she had all her life.  When she started having pain, she reached out to Dr. Sherrie Sport. - She previously had mammogram machine malfunction and had severe pain and traumatized by that experience.  She was having ultrasound of the breast every other year since then. - She was on hormone replacement therapy for close to 10 years (from age [age]). - Ultrasound of the left breast on 01/08/2021 showed hyper vascular hypoechoic mass at 12 o'clock position measuring 3.8 x 1.3 x 2.5 cm.  There are calcifications located within the mass.  Mass extends to the level of the skin with mild associated skin thickening.  At least 3 morphologically abnormal lymph nodes in the left axilla. - Mammogram showed irregular spiculated mass with associated calcifications in the retroareolar to upper left breast measuring approximately 6 cm.  There are at least 4 morphologically abnormal lymph nodes identified in the left axilla. - Ultrasound-guided left breast and left axillary lymph node biopsy on 01/15/2021 - Pathology consistent with invasive lobular carcinoma, E-cadherin negative.  ER/PR/HER2 negative.  HER2 2+ by IHC, negative by FISH.  Ki-67 is 5%.  Lymph node core biopsy was consistent with metastatic carcinoma.  Grade 2. - PET scan on 02/03/2021 showed involvement of left supraclavicular, subpectoral, axillary lymph nodes along with the breast mass.  10 mm left lung nodule which is hypometabolic.  Spinal cord lesion at T12 level with a strong uptake. - MRI of the lumbar spine with and without contrast on 02/20/2021 showed no mass or abnormal enhancement within the canal at the T12 level to correspond to the site of PET  scan positive.  No marrow replacing bone lesion. - 2D echo on 02/21/2021 with EF 55-60%. - She was started on weekly carboplatin and paclitaxel along with pembrolizumab 02/27/21.   -She experienced colitis, likely related to [facility], or possible related to chemo. I reviewed her outside CT scan report  -due to her upcoming vacation, I would recommend holding treatment today. We discussed returning this Monday and receiving carboplatin and paclitaxel alone.   3.  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  She is non-smoker. - Mother died of breast cancer.  Maternal grandmother died very young in her 58s, sister has fibrocystic disease.  Father died of lung cancer and was a smoker.     PLAN:  -she is recovering well from recent hospitalization  -also due to her vacation next week, We will hold treatment today -She will receive IVF 1L alone today -She will return 03/24/21 for carboplatin and paclitaxel alone. She will follow up with Dr. Delton Coombes on next visit    No problem-specific Assessment & Plan notes found for this encounter.   No orders of the defined types were placed in this encounter.  All questions were answered. The patient knows to call the clinic with any problems, questions or concerns. No barriers to learning was detected. The total time spent in the appointment was 30 minutes.     Truitt Merle, MD 03/06/2021   I, Wilburn Mylar, am acting as scribe for Truitt Merle, MD.   I have reviewed the above documentation for accuracy and completeness, and I agree with the above.

## 2021-03-06 NOTE — Progress Notes (Signed)
Patient presents today for Taxol/Carbo infusion per provider orders.  Vital signs and labs within parameters.    No treatment today per Dr. Burr Medico orders, patient to get one liter of normal saline over two hours.  One liter of normal saline given today per MD orders.  Stable during infusion without adverse affects.  Vital signs stable.  No complaints at this time.  Discharge from clinic ambulatory in stable condition.  Alert and oriented X 3.  Follow up with [facility] as scheduled.

## 2021-03-10 DIAGNOSIS — R21 Rash and other nonspecific skin eruption: Secondary | ICD-10-CM | POA: Diagnosis not present

## 2021-03-10 DIAGNOSIS — K523 Indeterminate colitis: Secondary | ICD-10-CM | POA: Diagnosis not present

## 2021-03-11 ENCOUNTER — Telehealth (HOSPITAL_COMMUNITY): Payer: Self-pay | Admitting: *Deleted

## 2021-03-11 NOTE — Telephone Encounter (Signed)
Attempted to contact patient to advise that she should be seen by a provider today wether it be Urgent care or PCP, as she only had fluids on 6/23 and no treatment, therefore it is not related to her treatment.  No answer and mailbox is full.  Could not leave a message.

## 2021-03-11 NOTE — Telephone Encounter (Signed)
Patient had treatment on 6/23 and states that she began noticing a rash on her arms, knees and upper chest.  Describes the rash as burning, hot, itchy and puffy.  Denies a fever, however has indicated a sore throat and sinus drainage off and on as well.  States that she went to her PCP yesterday, who assessed the rash and called in a "cream"  that she has yet to pick up.  States that it is worse today.  Advised that it is unlikely due to her treatment, however an infection should be ruled out by her PCP and she should return to him since symptoms are worsening today.  Verbalized understanding.

## 2021-03-14 ENCOUNTER — Telehealth (HOSPITAL_COMMUNITY): Payer: Self-pay | Admitting: *Deleted

## 2021-03-14 NOTE — Telephone Encounter (Signed)
Have attempted multiple times to follow up with patient about the stage of her rash.  No answer and VM box is full.  Has been seeing PCP for this issue as well.

## 2021-03-26 NOTE — Progress Notes (Signed)
Denise [name], [place] [id number]   CLINIC:  Medical Oncology/Hematology  PCP:  Neale Burly, MD [place] / [place] [id number] [phone]   REASON FOR VISIT:  Follow-up for left breast cancer  PRIOR THERAPY: none  NGS Results: not done  CURRENT THERAPY: weekly carboplatin and paclitaxel along with pembrolizumab  BRIEF ONCOLOGIC HISTORY:  Oncology History  Invasive lobular carcinoma of left breast in female [facility])  01/23/2021 Initial Diagnosis   Invasive lobular carcinoma of left breast in female [facility])   02/19/2021 Genetic Testing   Negative genetic testing on the CancerNext-Expanded+RNAinsight panel.  SMARCB1 VUS identified.  The CancerNext-Expanded gene panel offered by [facility] and includes sequencing and rearrangement analysis for the following 77 genes: AIP, ALK, APC*, ATM*, AXIN2, BAP1, BARD1, BLM, BMPR1A, BRCA1*, BRCA2*, BRIP1*, CDC73, CDH1*, CDK4, CDKN1B, CDKN2A, CHEK2*, CTNNA1, DICER1, FANCC, FH, FLCN, GALNT12, KIF1B, LZTR1, MAX, MEN1, MET, MLH1*, MSH2*, MSH3, MSH6*, MUTYH*, NBN, NF1*, NF2, NTHL1, PALB2*, PHOX2B, PMS2*, POT1, PRKAR1A, PTCH1, PTEN*, RAD51C*, RAD51D*, RB1, RECQL, RET, SDHA, SDHAF2, SDHB, SDHC, SDHD, SMAD4, SMARCA4, SMARCB1, SMARCE1, STK11, SUFU, TMEM127, TP53*, TSC1, TSC2, VHL and XRCC2 (sequencing and deletion/duplication); EGFR, EGLN1, HOXB13, KIT, MITF, PDGFRA, POLD1, and POLE (sequencing only); EPCAM and GREM1 (deletion/duplication only). DNA and RNA analyses performed for * genes. The report date is February 19, 2021.   02/27/2021 -  Chemotherapy    Patient is on Treatment Plan: BREAST PEMBROLIZUMAB + CARBOPLATIN D1,8,15+ PACLITAXEL D1,8,15 Q21D X 4 CYCLES / PEMBROLIZUMAB + AC Q21D X 4 CYCLES         CANCER STAGING: Cancer Staging Invasive lobular carcinoma of left breast in female [facility]) Staging form: Breast, AJCC 8th Edition - Clinical stage from 01/23/2021: cT3, cN3c, G2, ER-, PR-, HER2- -  Unsigned  Virtual Visit via Video Note  I connected with Deliah Boston on '@TODAY' @ at 10:15 AM EDT by a video enabled telemedicine application and verified that I am speaking with the correct person using two identifiers.  Location: Patient: clinic Provider: home  Others present: Renda Rolls, RN  I discussed the limitations of evaluation and management by telemedicine and the availability of in person appointments. The patient expressed understanding and agreed to proceed.  INTERVAL HISTORY:  Denise [name], a 70 y.o. female, returns for routine follow-up and consideration for next cycle of chemotherapy. Denise [name] was last seen on 02/24/21.  Due for day #8 cycle #1 of carboplatin + paclitaxel + pembrolizumab    Overall, she tells me she has been feeling pretty well. She was hospitalized due to colitis 06/19. She had stopped taking miralax in preparation for chemo and had been constipated for 6 days, and on 06/19 she took colace after which she experienced vomiting, diarrhea, and bloody stool. She stayed overnight in the hospital, and was put on 40 mg prednisone BID for 3 days. She has restarted taking miralax, and those issues have resolved. She denies any current n/v/d/c as well as tingling or numbness in her hands and feet, but reports a dark red rash on exposed skin following sun exposure starting 07/09 and lasting several days. She denies any previous skin rashes or autoimmune issues. She is on 75 mcg synthroid. Her energy levels are at baseline, and her appetite is good.   Overall, she feels ready for next cycle of chemo today.   REVIEW OF SYSTEMS:  Review of Systems  Constitutional:  Negative for appetite change and fatigue.  Gastrointestinal:  Negative for  constipation, diarrhea, nausea and vomiting.  Skin:  Positive for rash.   PAST MEDICAL/SURGICAL HISTORY:  Past Medical History:  Diagnosis Date   Asthma    as child   Cancer ([place]) 12/2020   left breast IMC    Complication of anesthesia    pateitn states,' I coded when I had my D&Cmany years ago.   Family history of breast cancer    Hypertension    Hypothyroidism    PONV (postoperative nausea and vomiting)    Port-A-Cath in place 02/26/2021   Past Surgical History:  Procedure Laterality Date   ABDOMINAL HYSTERECTOMY     BIOPSY  01/17/2018   Procedure: BIOPSY;  Surgeon: Rogene Houston, MD;  [facility];  Service: Endoscopy;;  duodenum,gastric   CHOLECYSTECTOMY     COLONOSCOPY WITH PROPOFOL N/A 01/17/2018   Procedure: COLONOSCOPY WITH PROPOFOL;  Surgeon: Rogene Houston, MD;  [facility];  Service: Endoscopy;  Laterality: N/A;  7:30   DILATION AND CURETTAGE OF UTERUS     ESOPHAGOGASTRODUODENOSCOPY (EGD) WITH PROPOFOL N/A 01/17/2018   Procedure: ESOPHAGOGASTRODUODENOSCOPY (EGD) WITH PROPOFOL;  Surgeon: Rogene Houston, MD;  [facility];  Service: Endoscopy;  Laterality: N/A;   POLYPECTOMY  01/17/2018   Procedure: POLYPECTOMY;  Surgeon: Rogene Houston, MD;  [facility];  Service: Endoscopy;;  transverse colon, cecal   PORTACATH PLACEMENT Right 02/17/2021   Procedure: INSERTION PORT-A-CATH;  Surgeon: Coralie Keens, MD;  Location: [place];  Service: General;  Laterality: Right;    SOCIAL HISTORY:  Social History   Socioeconomic History   Marital status: Widowed    Spouse name: Not on file   Number of children: 3   Years of education: Not on file   Highest education level: Not on file  Occupational History   Occupation: [employer]   Occupation: retired  Tobacco Use   Smoking status: Never   Smokeless tobacco: Never  Scientific laboratory technician Use: Never used  Substance and Sexual Activity   Alcohol use: Never   Drug use: Never   Sexual activity: Not Currently    Birth control/protection: Surgical  Other Topics Concern   Not on file  Social History Narrative   Not on file   Social Determinants of Health    Financial Resource Strain: Low Risk    Difficulty of Paying Living Expenses: Not hard at all  Food Insecurity: No Food Insecurity   Worried About Charity fundraiser in the Last Year: Never true   [place] in the Last Year: Never true  Transportation Needs: No Transportation Needs   Lack of Transportation (Medical): No   Lack of Transportation (Non-Medical): No  Physical Activity: Insufficiently Active   Days of Exercise per Week: 3 days   Minutes of Exercise per Session: 30 min  Stress: No Stress Concern Present   Feeling of Stress : Not at all  Social Connections: Moderately Integrated   Frequency of Communication with Friends and Family: More than three times a week   Frequency of Social Gatherings with Friends and Family: More than three times a week   Attends Religious Services: More than 4 times per year   Active Member of Clubs or Organizations: No   Attends Music therapist: More than 4 times per year   Marital Status: Widowed  Intimate Partner Violence: Not At Risk   Fear of Current or Ex-Partner: No   Emotionally  Abused: No   Physically Abused: No   Sexually Abused: No    FAMILY HISTORY:  Family History  Problem Relation Age of Onset   Breast cancer Mother        dx in her 17s   Heart disease Mother    Thyroid disease Mother    Lung cancer Father        dx in his 61s   Thyroid disease Maternal Aunt    Thyroid nodules Maternal Grandmother 43       goiter   Thyroid disease Maternal Grandmother    Heart disease Maternal Grandfather    Heart disease Paternal Grandmother    Heart disease Paternal Grandfather    Thyroid disease Daughter    Thyroid disease Daughter    Cancer Maternal Uncle        NOS   Cancer Paternal Uncle        NOS   Breast cancer Cousin        pat first cousin died in her 56s;     CURRENT MEDICATIONS:  Current Outpatient Medications  Medication Sig Dispense Refill   Acetaminophen (TYLENOL PO) Take 1 tablet by  mouth as needed.     albuterol (VENTOLIN HFA) 108 (90 Base) MCG/ACT inhaler Inhale into the lungs.     CARBOPLATIN IV Inject into the vein once a week.     gabapentin (NEURONTIN) 300 MG capsule Take 1 capsule by mouth at bedtime.     hydrochlorothiazide (MICROZIDE) 12.5 MG capsule Take 12.5 mg by mouth daily.     levothyroxine (SYNTHROID) 75 MCG tablet Take 100 mcg by mouth daily before breakfast.     lidocaine-prilocaine (EMLA) cream Apply a small amount to port a cath site and cover with plastic wrap 1 hour prior to infusion appointments (Patient not taking: Reported on 03/06/2021) 30 g 3   lisinopril (PRINIVIL,ZESTRIL) 40 MG tablet Take 40 mg by mouth daily.     Melatonin 5 MG TABS Take 5 mg by mouth. As needed for sleep     Omega-3 Fatty Acids (FISH OIL PO) Take by mouth.     PACLITAXEL IV Inject into the vein once a week.     Pembrolizumab (KEYTRUDA IV) Inject into the vein every 21 ( twenty-one) days.     prochlorperazine (COMPAZINE) 10 MG tablet Take 1 tablet (10 mg total) by mouth every 6 (six) hours as needed (Nausea or vomiting). 30 tablet 1   psyllium (METAMUCIL) 58.6 % packet Take by mouth.     No current facility-administered medications for this visit.    ALLERGIES:  Allergies  Allergen Reactions   Exforge [Amlodipine Besylate-Valsartan] Nausea And Vomiting   Cheese    Strawberry Extract Diarrhea    Seeds,nuts, lettuce, grapes   Tramadol Hcl Nausea And Vomiting   Yeast-Related Products Hives    bread    Performance status (ECOG): 1 - Symptomatic but completely ambulatory  There were no vitals filed for this visit. Wt Readings from Last 3 Encounters:  03/06/21 172 lb 11.2 oz (78.3 kg)  02/27/21 176 lb 2.4 oz (79.9 kg)  02/24/21 176 lb 9.6 oz (80.1 kg)    LABORATORY DATA:  I have reviewed the labs as listed.  CBC Latest Ref Rng & Units 03/06/2021 02/27/2021 01/13/2018  WBC 4.0 - 10.5 K/uL 5.9 3.5(L) 5.5  Hemoglobin 12.0 - 15.0 g/dL 12.5 12.3 13.1  Hematocrit 36.0 -  46.0 % 37.1 37.4 40.4  Platelets 150 - 400 K/uL 281 246 313  CMP Latest Ref Rng & Units 03/06/2021 02/27/2021 02/12/2021  Glucose 70 - 99 mg/dL 88 111(H) 115(H)  BUN 8 - 23 mg/dL 24(H) 16 18  Creatinine 0.44 - 1.00 mg/dL 0.81 0.89 0.88  Sodium 135 - 145 mmol/L 134(L) 139 137  Potassium 3.5 - 5.1 mmol/L 3.8 3.8 4.0  Chloride 98 - 111 mmol/L 100 105 104  CO2 22 - 32 mmol/L '27 28 25  ' Calcium 8.9 - 10.3 mg/dL 9.0 8.9 9.1  Total Protein 6.5 - 8.1 g/dL 6.7 7.0 -  Total Bilirubin 0.3 - 1.2 mg/dL 1.0 0.5 -  Alkaline Phos 38 - 126 U/L 54 68 -  AST 15 - 41 U/L 49(H) 17 -  ALT 0 - 44 U/L 77(H) 14 -    DIAGNOSTIC IMAGING:  I have independently reviewed the scans and discussed with the patient. No results found.   ASSESSMENT:  1.  T3N3c (stage IIIc) triple negative invasive lobular carcinoma of the left breast: - She felt lump in her left breast for more than 6 months, but thought it was secondary to fibrocystic disease which she had all her life.  When she started having pain, she reached out to Dr. Sherrie Sport. - She previously had mammogram machine malfunction and had severe pain and traumatized by that experience.  She was having ultrasound of the breast every other year since then. - She was on hormone replacement therapy for close to 10 years (from age [age]). - Ultrasound of the left breast on 01/08/2021 showed hyper vascular hypoechoic mass at 12 o'clock position measuring 3.8 x 1.3 x 2.5 cm.  There are calcifications located within the mass.  Mass extends to the level of the skin with mild associated skin thickening.  At least 3 morphologically abnormal lymph nodes in the left axilla. - Mammogram showed irregular spiculated mass with associated calcifications in the retroareolar to upper left breast measuring approximately 6 cm.  There are at least 4 morphologically abnormal lymph nodes identified in the left axilla. - Ultrasound-guided left breast and left axillary lymph node biopsy on 01/15/2021 -  Pathology consistent with invasive lobular carcinoma, E-cadherin negative.  ER/PR/HER2 negative.  HER2 2+ by IHC, negative by FISH.  Ki-67 is 5%.  Lymph node core biopsy was consistent with metastatic carcinoma.  Grade 2. - PET scan on 02/03/2021 showed involvement of left supraclavicular, subpectoral, axillary lymph nodes along with the breast mass.  10 mm left lung nodule which is hypometabolic.  Spinal cord lesion at T12 level with a strong uptake. - MRI of the lumbar spine with and without contrast on 02/20/2021 showed no mass or abnormal enhancement within the canal at the T12 level to correspond to the site of PET scan positive.  No marrow replacing bone lesion. - 2D echo on 02/21/2021 with EF 55-60%. - Weekly carboplatin and paclitaxel with every 3 weeks pembrolizumab ([id number]) started on 02/27/2021.   2.  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  She is non-smoker. - Mother died of breast cancer.  Maternal grandmother died very young in her 54s, sister has fibrocystic disease.  Father died of lung cancer and was a smoker.   PLAN:  1.  T3N3c triple negative invasive lobular carcinoma of the left breast: - She received first weekly dose of carboplatin, paclitaxel and Keytruda on 02/27/2021.  She developed constipation after the first cycle.  She took Colace and had diarrhea for 6 hours on 03/02/2021 along with vomiting.  She  was hospitalized at [facility] for IV fluids overnight and was placed on prednisone 40 mg twice daily for 3 days. - She had a family reunion in the first week of July.  Hence she did not get date of chemotherapy. - She also reported a rash on 03/01/2021 when she was outside.  Rash was purplish and was in the sun exposed areas on the extremities and upper neck.  It lasted about a week. - Reviewed her labs today which showed normal LFTs and electrolytes.  CBC shows white count 3.1 ANC 1.4.  Platelet count is normal. - We will  proceed with day 8 of carboplatin and paclitaxel. - She will proceed with day 15 of chemotherapy next week. - I will see her back in 2 weeks for follow-up. - She might need short acting G-CSF 480 mcg next Thursday if her ANC is significantly lower than 1500.  2.  Hypothyroidism: - She is on Synthroid 75 mcg daily. - Last TSH on 03/06/2021 was 0.29.  We will closely monitor as immunotherapy can affect TSH levels.    Breast Cancer therapy associated bone loss: I have recommended calcium, Vitamin D and weight bearing exercises.   Orders placed this encounter:  No orders of the defined types were placed in this encounter.     The patient has a good understanding of the overall plan. She agrees with it. She will call with any problems that may develop before the next visit here.   Orders placed this encounter:  No orders of the defined types were placed in this encounter.  I provided 30 minutes of non-face-to-face time during this encounter.  Derek Jack, MD [place] [phone]   I, Thana Ates, am acting as a scribe for Dr. Derek Jack.  I, Derek Jack MD, have reviewed the above documentation for accuracy and completeness, and I agree with the above.

## 2021-03-27 ENCOUNTER — Inpatient Hospital Stay (HOSPITAL_COMMUNITY): Payer: Medicare Other

## 2021-03-27 ENCOUNTER — Inpatient Hospital Stay (HOSPITAL_BASED_OUTPATIENT_CLINIC_OR_DEPARTMENT_OTHER): Payer: Medicare Other | Admitting: Hematology

## 2021-03-27 ENCOUNTER — Inpatient Hospital Stay (HOSPITAL_COMMUNITY): Payer: Medicare Other | Attending: Hematology

## 2021-03-27 ENCOUNTER — Other Ambulatory Visit: Payer: Self-pay

## 2021-03-27 VITALS — BP 148/82 | HR 64 | Temp 98.3°F | Resp 18

## 2021-03-27 DIAGNOSIS — E039 Hypothyroidism, unspecified: Secondary | ICD-10-CM | POA: Insufficient documentation

## 2021-03-27 DIAGNOSIS — Z5112 Encounter for antineoplastic immunotherapy: Secondary | ICD-10-CM | POA: Diagnosis not present

## 2021-03-27 DIAGNOSIS — I10 Essential (primary) hypertension: Secondary | ICD-10-CM | POA: Insufficient documentation

## 2021-03-27 DIAGNOSIS — Z171 Estrogen receptor negative status [ER-]: Secondary | ICD-10-CM | POA: Diagnosis not present

## 2021-03-27 DIAGNOSIS — Z79899 Other long term (current) drug therapy: Secondary | ICD-10-CM | POA: Insufficient documentation

## 2021-03-27 DIAGNOSIS — J45909 Unspecified asthma, uncomplicated: Secondary | ICD-10-CM | POA: Diagnosis not present

## 2021-03-27 DIAGNOSIS — Z95828 Presence of other vascular implants and grafts: Secondary | ICD-10-CM

## 2021-03-27 DIAGNOSIS — Z5111 Encounter for antineoplastic chemotherapy: Secondary | ICD-10-CM | POA: Insufficient documentation

## 2021-03-27 DIAGNOSIS — C50912 Malignant neoplasm of unspecified site of left female breast: Secondary | ICD-10-CM | POA: Insufficient documentation

## 2021-03-27 DIAGNOSIS — Z803 Family history of malignant neoplasm of breast: Secondary | ICD-10-CM | POA: Insufficient documentation

## 2021-03-27 LAB — COMPREHENSIVE METABOLIC PANEL
ALT: 15 U/L (ref 0–44)
AST: 17 U/L (ref 15–41)
Albumin: 3.7 g/dL (ref 3.5–5.0)
Alkaline Phosphatase: 60 U/L (ref 38–126)
Anion gap: 6 (ref 5–15)
BUN: 20 mg/dL (ref 8–23)
CO2: 25 mmol/L (ref 22–32)
Calcium: 8.7 mg/dL — ABNORMAL LOW (ref 8.9–10.3)
Chloride: 107 mmol/L (ref 98–111)
Creatinine, Ser: 0.79 mg/dL (ref 0.44–1.00)
GFR, Estimated: >60 mL/min (ref >60)
Glucose, Bld: 104 mg/dL — ABNORMAL HIGH (ref 70–99)
Potassium: 3.9 mmol/L (ref 3.5–5.1)
Sodium: 138 mmol/L (ref 135–145)
Total Bilirubin: 0.6 mg/dL (ref 0.3–1.2)
Total Protein: 6.8 g/dL (ref 6.5–8.1)

## 2021-03-27 LAB — CBC WITH DIFFERENTIAL/PLATELET
Abs Immature Granulocytes: 0.01 10*3/uL (ref 0.00–0.07)
Basophils Absolute: 0 10*3/uL (ref 0.0–0.1)
Basophils Relative: 0 %
Eosinophils Absolute: 0.1 10*3/uL (ref 0.0–0.5)
Eosinophils Relative: 3 %
HCT: 37.1 % (ref 36.0–46.0)
Hemoglobin: 12 g/dL (ref 12.0–15.0)
Immature Granulocytes: 0 %
Lymphocytes Relative: 39 %
Lymphs Abs: 1.2 10*3/uL (ref 0.7–4.0)
MCH: 32.2 pg (ref 26.0–34.0)
MCHC: 32.3 g/dL (ref 30.0–36.0)
MCV: 99.5 fL (ref 80.0–100.0)
Monocytes Absolute: 0.3 10*3/uL (ref 0.1–1.0)
Monocytes Relative: 11 %
Neutro Abs: 1.4 10*3/uL — ABNORMAL LOW (ref 1.7–7.7)
Neutrophils Relative %: 47 %
Platelets: 199 10*3/uL (ref 150–400)
RBC: 3.73 MIL/uL — ABNORMAL LOW (ref 3.87–5.11)
RDW: 12.7 % (ref 11.5–15.5)
WBC: 3.1 10*3/uL — ABNORMAL LOW (ref 4.0–10.5)
nRBC: 0 % (ref 0.0–0.2)

## 2021-03-27 LAB — MAGNESIUM: Magnesium: 1.9 mg/dL (ref 1.7–2.4)

## 2021-03-27 LAB — TSH: TSH: 1.368 u[IU]/mL (ref 0.350–4.500)

## 2021-03-27 MED ORDER — SODIUM CHLORIDE 0.9% FLUSH: 10.0000 mL | INTRAVENOUS | Status: DC | PRN

## 2021-03-27 MED ORDER — SODIUM CHLORIDE 0.9 % IV SOLN
80.0000 mg/m2 | Freq: Once | INTRAVENOUS | Status: AC
  Administered 2021-03-27: 156 mg via INTRAVENOUS
  Filled 2021-03-27: qty 26

## 2021-03-27 MED ORDER — SODIUM CHLORIDE 0.9 % IV SOLN
135.3000 mg | Freq: Once | INTRAVENOUS | Status: AC
Start: 2021-03-27 — End: 2021-03-27
  Administered 2021-03-27: 140 mg via INTRAVENOUS
  Filled 2021-03-27: qty 14

## 2021-03-27 MED ORDER — FAMOTIDINE 20 MG IN NS 100 ML IVPB
20.0000 mg | Freq: Once | INTRAVENOUS | Status: AC
  Administered 2021-03-27: 20 mg via INTRAVENOUS
  Filled 2021-03-27: qty 100

## 2021-03-27 MED ORDER — HEPARIN SOD (PORK) LOCK FLUSH 100 UNIT/ML IV SOLN
500.0000 [IU] | Freq: Once | INTRAVENOUS | Status: AC | PRN
Start: 2021-03-27 — End: 2021-03-27
  Administered 2021-03-27: 500 [IU]

## 2021-03-27 MED ORDER — PALONOSETRON HCL INJECTION 0.25 MG/5ML
0.2500 mg | Freq: Once | INTRAVENOUS | Status: AC
  Administered 2021-03-27: 0.25 mg via INTRAVENOUS
  Filled 2021-03-27: qty 5

## 2021-03-27 MED ORDER — SODIUM CHLORIDE 0.9 % IV SOLN: Freq: Once | INTRAVENOUS | Status: AC

## 2021-03-27 MED ORDER — DIPHENHYDRAMINE HCL 50 MG/ML IJ SOLN
50.0000 mg | Freq: Once | INTRAMUSCULAR | Status: AC
  Administered 2021-03-27: 50 mg via INTRAVENOUS
  Filled 2021-03-27: qty 1

## 2021-03-27 MED ORDER — SODIUM CHLORIDE 0.9 % IV SOLN
10.0000 mg | Freq: Once | INTRAVENOUS | Status: AC
  Administered 2021-03-27: 10 mg via INTRAVENOUS
  Filled 2021-03-27: qty 10

## 2021-03-27 NOTE — Progress Notes (Signed)
Patient has been assessed, vital signs and labs have been reviewed by Dr. Delton Coombes. ANC, Creatinine, LFTs, and Platelets are within treatment parameters per Dr. Delton Coombes. The patient is good to proceed with treatment at this time.  Chemo only.  No immunotherapy today.  Primary RN and pharmacy aware.

## 2021-03-27 NOTE — Progress Notes (Signed)
Patient presents today for treatment and virtual visit today with Dr. Delton Coombes. Labs pending. Patient has no complaints today.   WBC 1.3 and ANC 1.4 today.   13:44 pm  Patient states her last Taxol she developed a rash three days after the infusion. Patient informed Dr. Delton Coombes this morning during her virtual visit per patient's words. Patient states my knee looks red again and I think it's trying to do that again per patient's words. Patient denies itching, pain, shortness of breath.    13:50 pm Called [place] PA and requested her to assess patient at the bedside. Taxol infusion completed.   Dr. Delton Coombes notified by Dwilson RN pertaining to patient's complaints. Infusion of Taxol and Carboplatin completed and patient monitored for 45 minutes. Rash/mottling upon assessment less red than before. Patient denies any itching. Dr. Delton Coombes talked to patient about her rash during the virtual and aware.   Treatment given today per MD orders. Tolerated infusion without adverse affects. Vital signs stable. No complaints at this time. Discharged from clinic ambulatory in stable condition. Alert and oriented x 3. F/U with [facility] as scheduled.

## 2021-03-27 NOTE — Patient Instructions (Signed)
[facility]  Discharge Instructions: Thank you for choosing [facility] to provide your oncology and hematology care.  If you have a lab appointment with the Cancer Center, please come in thru the Main Entrance and check in at the main information desk.  Wear comfortable clothing and clothing appropriate for easy access to any Portacath or PICC line.   We strive to give you quality time with your provider. You may need to reschedule your appointment if you arrive late (15 or more minutes).  Arriving late affects you and other patients whose appointments are after yours.  Also, if you miss three or more appointments without notifying the office, you may be dismissed from the clinic at the provider's discretion.      For prescription refill requests, have your pharmacy contact our office and allow 72 hours for refills to be completed.    Today you received the following chemotherapy and/or immunotherapy agents Taxol/Carboplatin.       To help prevent nausea and vomiting after your treatment, we encourage you to take your nausea medication as directed.  BELOW ARE SYMPTOMS THAT SHOULD BE REPORTED IMMEDIATELY: *FEVER GREATER THAN 100.4 F (38 C) OR HIGHER *CHILLS OR SWEATING *NAUSEA AND VOMITING THAT IS NOT CONTROLLED WITH YOUR NAUSEA MEDICATION *UNUSUAL SHORTNESS OF BREATH *UNUSUAL BRUISING OR BLEEDING *URINARY PROBLEMS (pain or burning when urinating, or frequent urination) *BOWEL PROBLEMS (unusual diarrhea, constipation, pain near the anus) TENDERNESS IN MOUTH AND THROAT WITH OR WITHOUT PRESENCE OF ULCERS (sore throat, sores in mouth, or a toothache) UNUSUAL RASH, SWELLING OR PAIN  UNUSUAL VAGINAL DISCHARGE OR ITCHING   Items with * indicate a potential emergency and should be followed up as soon as possible or go to the Emergency Department if any problems should occur.  Please show the CHEMOTHERAPY ALERT CARD or IMMUNOTHERAPY ALERT CARD at check-in to the  Emergency Department and triage nurse.  Should you have questions after your visit or need to cancel or reschedule your appointment, please contact [facility] [phone]  and follow the prompts.  Office hours are 8:00 a.m. to 4:30 p.m. Monday - Friday. Please note that voicemails left after 4:00 p.m. may not be returned until the following business day.  We are closed weekends and major holidays. You have access to a nurse at all times for urgent questions. Please call the main number to the clinic [phone] and follow the prompts.  For any non-urgent questions, you may also contact your provider using MyChart. We now offer e-Visits for anyone 18 and older to request care online for non-urgent symptoms. For details visit [email or web address].   Also download the MyChart app! Go to the app store, search "MyChart", open the app, select [facility], and log in with your MyChart username and password.  Due to Covid, a mask is required upon entering the hospital/clinic. If you do not have a mask, one will be given to you upon arrival. For doctor visits, patients may have 1 support person aged 18 or older with them. For treatment visits, patients cannot have anyone with them due to current Covid guidelines and our immunocompromised population.  

## 2021-03-27 NOTE — Patient Instructions (Addendum)
[facility] at [facility] Discharge Instructions  You were seen today by Dr. Katragadda. He went over your recent results, and you received your treatment. Dr. Katragadda will see you back in 2 weeks for labs and follow up.   Thank you for choosing [facility] at [facility] to provide your oncology and hematology care.  To afford each patient quality time with our provider, please arrive at least 15 minutes before your scheduled appointment time.   If you have a lab appointment with the Cancer Center please come in thru the Main Entrance and check in at the main information desk  You need to re-schedule your appointment should you arrive 10 or more minutes late.  We strive to give you quality time with our providers, and arriving late affects you and other patients whose appointments are after yours.  Also, if you no show three or more times for appointments you may be dismissed from the clinic at the providers discretion.     Again, thank you for choosing [facility].  Our hope is that these requests will decrease the amount of time that you wait before being seen by our physicians.       _____________________________________________________________  Should you have questions after your visit to [facility], please contact our office at [phone] between the hours of 8:00 a.m. and 4:30 p.m.  Voicemails left after 4:00 p.m. will not be returned until the following business day.  For prescription refill requests, have your pharmacy contact our office and allow 72 hours.    Cancer Center Support Programs:   > Cancer Support Group  2nd Tuesday of the month 1pm-2pm, Journey Room   

## 2021-03-28 LAB — T4: T4, Total: 6.9 ug/dL (ref 4.5–12.0)

## 2021-04-02 ENCOUNTER — Other Ambulatory Visit: Payer: Self-pay

## 2021-04-02 ENCOUNTER — Other Ambulatory Visit (HOSPITAL_COMMUNITY): Payer: Medicare Other

## 2021-04-02 ENCOUNTER — Inpatient Hospital Stay (HOSPITAL_BASED_OUTPATIENT_CLINIC_OR_DEPARTMENT_OTHER): Payer: Medicare Other | Admitting: Hematology and Oncology

## 2021-04-02 ENCOUNTER — Encounter (HOSPITAL_COMMUNITY): Payer: Self-pay | Admitting: Hematology and Oncology

## 2021-04-02 DIAGNOSIS — C50912 Malignant neoplasm of unspecified site of left female breast: Secondary | ICD-10-CM | POA: Diagnosis not present

## 2021-04-02 DIAGNOSIS — Z171 Estrogen receptor negative status [ER-]: Secondary | ICD-10-CM | POA: Diagnosis not present

## 2021-04-02 DIAGNOSIS — R231 Pallor: Secondary | ICD-10-CM | POA: Diagnosis not present

## 2021-04-02 DIAGNOSIS — Z5112 Encounter for antineoplastic immunotherapy: Secondary | ICD-10-CM | POA: Diagnosis not present

## 2021-04-02 DIAGNOSIS — E039 Hypothyroidism, unspecified: Secondary | ICD-10-CM | POA: Diagnosis not present

## 2021-04-02 DIAGNOSIS — D61818 Other pancytopenia: Secondary | ICD-10-CM | POA: Diagnosis not present

## 2021-04-02 DIAGNOSIS — J45909 Unspecified asthma, uncomplicated: Secondary | ICD-10-CM | POA: Diagnosis not present

## 2021-04-02 DIAGNOSIS — Z5111 Encounter for antineoplastic chemotherapy: Secondary | ICD-10-CM | POA: Diagnosis not present

## 2021-04-02 LAB — COMPREHENSIVE METABOLIC PANEL
ALT: 17 U/L (ref 0–44)
AST: 20 U/L (ref 15–41)
Albumin: 4.1 g/dL (ref 3.5–5.0)
Alkaline Phosphatase: 62 U/L (ref 38–126)
Anion gap: 7 (ref 5–15)
BUN: 19 mg/dL (ref 8–23)
CO2: 25 mmol/L (ref 22–32)
Calcium: 8.9 mg/dL (ref 8.9–10.3)
Chloride: 102 mmol/L (ref 98–111)
Creatinine, Ser: 0.81 mg/dL (ref 0.44–1.00)
GFR, Estimated: >60 mL/min (ref >60)
Glucose, Bld: 91 mg/dL (ref 70–99)
Potassium: 4.5 mmol/L (ref 3.5–5.1)
Sodium: 134 mmol/L — ABNORMAL LOW (ref 135–145)
Total Bilirubin: 0.7 mg/dL (ref 0.3–1.2)
Total Protein: 7.3 g/dL (ref 6.5–8.1)

## 2021-04-02 LAB — CBC WITH DIFFERENTIAL/PLATELET
Abs Immature Granulocytes: 0.01 10*3/uL (ref 0.00–0.07)
Basophils Absolute: 0 10*3/uL (ref 0.0–0.1)
Basophils Relative: 0 %
Eosinophils Absolute: 0.1 10*3/uL (ref 0.0–0.5)
Eosinophils Relative: 2 %
HCT: 35.8 % — ABNORMAL LOW (ref 36.0–46.0)
Hemoglobin: 11.9 g/dL — ABNORMAL LOW (ref 12.0–15.0)
Immature Granulocytes: 0 %
Lymphocytes Relative: 49 %
Lymphs Abs: 1.8 10*3/uL (ref 0.7–4.0)
MCH: 33 pg (ref 26.0–34.0)
MCHC: 33.2 g/dL (ref 30.0–36.0)
MCV: 99.2 fL (ref 80.0–100.0)
Monocytes Absolute: 0.2 10*3/uL (ref 0.1–1.0)
Monocytes Relative: 6 %
Neutro Abs: 1.7 10*3/uL (ref 1.7–7.7)
Neutrophils Relative %: 43 %
Platelets: 212 10*3/uL (ref 150–400)
RBC: 3.61 MIL/uL — ABNORMAL LOW (ref 3.87–5.11)
RDW: 12.6 % (ref 11.5–15.5)
WBC: 3.8 10*3/uL — ABNORMAL LOW (ref 4.0–10.5)
nRBC: 0 % (ref 0.0–0.2)

## 2021-04-02 LAB — TSH: TSH: 3.606 u[IU]/mL (ref 0.350–4.500)

## 2021-04-02 LAB — MAGNESIUM: Magnesium: 1.8 mg/dL (ref 1.7–2.4)

## 2021-04-02 LAB — T4, FREE: Free T4: 0.99 ng/dL (ref 0.61–1.12)

## 2021-04-02 NOTE — Assessment & Plan Note (Signed)
She is concerned about possible enlarging breast mass It is difficult for me to assess since this is the first time I examined her By my own measurement, the tumor measured approximately 5 cm in size There is discrepancy between multiple measurements taken from multiple different modalities Her mammogram from April suggested subcentimeter mass but smaller in masses noted on ultrasound Her latest PET CT scan from Feb 03, 2021 suggested the breast mass measured approximately 4.7 cm in size I could not palpate abnormal lymphadenopathy For now, I recommend we proceed with treatment as scheduled I will try to get a PET CT scan as soon as possible to determine whether she is responding to treatment or not

## 2021-04-02 NOTE — Progress Notes (Signed)
[place] FOLLOW-UP progress notes  Patient Care Team: Neale Burly, MD as PCP - General (Internal Medicine) Brien Mates, RN as Oncology Nurse Navigator (Oncology)  CHIEF COMPLAINTS/PURPOSE OF VISIT:  Breast cancer, for further evaluation  HISTORY OF PRESENTING ILLNESS:  Denise [name] 70 y.o. female is seen today because her primary oncologist is not available The patient was diagnosed with invasive lobular carcinoma of the left breast She had abnormal mammogram in April leading to multiple evaluation including ultrasound, biopsy and PET CT scan She is recommended neoadjuvant chemotherapy approach and had received 2 doses of chemotherapy with carboplatin, paclitaxel and pembrolizumab With the last treatment, pembrolizumab was placed on hold She had recent colitis but that has resolved She denies peripheral neuropathy from treatment Denies recent nausea At the end of her last week's treatment, she noticed some abnormal skin rash around her ankle but that has resolved She called because of concern that she thought the left breast mass is slightly bigger since her last treatment She denies nipple discharge She has intermittent tingling sensation over her left breast that comes and goes  I reviewed the patient's records extensive and collaborated the history with the patient. Summary of her history is as follows: Oncology History  Invasive lobular carcinoma of left breast in female [facility])  01/23/2021 Initial Diagnosis   Invasive lobular carcinoma of left breast in female [facility])   02/19/2021 Genetic Testing   Negative genetic testing on the CancerNext-Expanded+RNAinsight panel.  SMARCB1 VUS identified.  The CancerNext-Expanded gene panel offered by [facility] and includes sequencing and rearrangement analysis for the following 77 genes: AIP, ALK, APC*, ATM*, AXIN2, BAP1, BARD1, BLM, BMPR1A, BRCA1*, BRCA2*, BRIP1*, CDC73, CDH1*, CDK4, CDKN1B, CDKN2A, CHEK2*, CTNNA1,  DICER1, FANCC, FH, FLCN, GALNT12, KIF1B, LZTR1, MAX, MEN1, MET, MLH1*, MSH2*, MSH3, MSH6*, MUTYH*, NBN, NF1*, NF2, NTHL1, PALB2*, PHOX2B, PMS2*, POT1, PRKAR1A, PTCH1, PTEN*, RAD51C*, RAD51D*, RB1, RECQL, RET, SDHA, SDHAF2, SDHB, SDHC, SDHD, SMAD4, SMARCA4, SMARCB1, SMARCE1, STK11, SUFU, TMEM127, TP53*, TSC1, TSC2, VHL and XRCC2 (sequencing and deletion/duplication); EGFR, EGLN1, HOXB13, KIT, MITF, PDGFRA, POLD1, and POLE (sequencing only); EPCAM and GREM1 (deletion/duplication only). DNA and RNA analyses performed for * genes. The report date is February 19, 2021.   02/27/2021 -  Chemotherapy    Patient is on Treatment Plan: BREAST PEMBROLIZUMAB + CARBOPLATIN D1,8,15+ PACLITAXEL D1,8,15 Q21D X 4 CYCLES / PEMBROLIZUMAB + AC Q21D X 4 CYCLES         MEDICAL HISTORY:  Past Medical History:  Diagnosis Date   Asthma    as child   Cancer ([place]) 12/2020   left breast IMC   Complication of anesthesia    pateitn states,' I coded when I had my D&Cmany years ago.   Family history of breast cancer    Hypertension    Hypothyroidism    PONV (postoperative nausea and vomiting)    Port-A-Cath in place 02/26/2021    SURGICAL HISTORY: Past Surgical History:  Procedure Laterality Date   ABDOMINAL HYSTERECTOMY     BIOPSY  01/17/2018   Procedure: BIOPSY;  Surgeon: Rogene Houston, MD;  [facility];  Service: Endoscopy;;  duodenum,gastric   CHOLECYSTECTOMY     COLONOSCOPY WITH PROPOFOL N/A 01/17/2018   Procedure: COLONOSCOPY WITH PROPOFOL;  Surgeon: Rogene Houston, MD;  [facility];  Service: Endoscopy;  Laterality: N/A;  7:30   DILATION AND CURETTAGE OF UTERUS     ESOPHAGOGASTRODUODENOSCOPY (EGD) WITH PROPOFOL N/A 01/17/2018   Procedure: ESOPHAGOGASTRODUODENOSCOPY (EGD)  WITH PROPOFOL;  Surgeon: Rogene Houston, MD;  [facility];  Service: Endoscopy;  Laterality: N/A;   POLYPECTOMY  01/17/2018   Procedure: POLYPECTOMY;  Surgeon: Rogene Houston, MD;  [facility];   Service: Endoscopy;;  transverse colon, cecal   PORTACATH PLACEMENT Right 02/17/2021   Procedure: INSERTION PORT-A-CATH;  Surgeon: Coralie Keens, MD;  Location: [place];  Service: General;  Laterality: Right;    SOCIAL HISTORY: Social History   Socioeconomic History   Marital status: Widowed    Spouse name: Not on file   Number of children: 3   Years of education: Not on file   Highest education level: Not on file  Occupational History   Occupation: [employer]   Occupation: retired  Tobacco Use   Smoking status: Never   Smokeless tobacco: Never  Scientific laboratory technician Use: Never used  Substance and Sexual Activity   Alcohol use: Never   Drug use: Never   Sexual activity: Not Currently    Birth control/protection: Surgical  Other Topics Concern   Not on file  Social History Narrative   Not on file   Social Determinants of Health   Financial Resource Strain: Low Risk    Difficulty of Paying Living Expenses: Not hard at all  Food Insecurity: No Food Insecurity   Worried About Charity fundraiser in the Last Year: Never true   [place] in the Last Year: Never true  Transportation Needs: No Transportation Needs   Lack of Transportation (Medical): No   Lack of Transportation (Non-Medical): No  Physical Activity: Insufficiently Active   Days of Exercise per Week: 3 days   Minutes of Exercise per Session: 30 min  Stress: No Stress Concern Present   Feeling of Stress : Not at all  Social Connections: Moderately Integrated   Frequency of Communication with Friends and Family: More than three times a week   Frequency of Social Gatherings with Friends and Family: More than three times a week   Attends Religious Services: More than 4 times per year   Active Member of Clubs or Organizations: No   Attends Music therapist: More than 4 times per year   Marital Status: Widowed  Human resources officer Violence: Not At Risk   Fear of  Current or Ex-Partner: No   Emotionally Abused: No   Physically Abused: No   Sexually Abused: No    FAMILY HISTORY: Family History  Problem Relation Age of Onset   Breast cancer Mother        dx in her 30s   Heart disease Mother    Thyroid disease Mother    Lung cancer Father        dx in his 31s   Thyroid disease Maternal Aunt    Thyroid nodules Maternal Grandmother 35       goiter   Thyroid disease Maternal Grandmother    Heart disease Maternal Grandfather    Heart disease Paternal Grandmother    Heart disease Paternal Grandfather    Thyroid disease Daughter    Thyroid disease Daughter    Cancer Maternal Uncle        NOS   Cancer Paternal Uncle        NOS   Breast cancer Cousin        pat first cousin died in her 6s;     ALLERGIES:  is allergic to exforge [amlodipine besylate-valsartan], cheese, strawberry extract,  tramadol hcl, and yeast-related products.  MEDICATIONS:  Current Outpatient Medications  Medication Sig Dispense Refill   albuterol (VENTOLIN HFA) 108 (90 Base) MCG/ACT inhaler Inhale into the lungs.     benazepril (LOTENSIN) 20 MG tablet Take 20 mg by mouth daily.     CARBOPLATIN IV Inject into the vein once a week.     doxepin (SINEQUAN) 10 MG capsule Take 10 mg by mouth at bedtime.     gabapentin (NEURONTIN) 300 MG capsule Take 1 capsule by mouth at bedtime.     hydrochlorothiazide (MICROZIDE) 12.5 MG capsule Take 12.5 mg by mouth daily.     levothyroxine (SYNTHROID) 75 MCG tablet Take 100 mcg by mouth daily before breakfast.     lidocaine-prilocaine (EMLA) cream Apply a small amount to port a cath site and cover with plastic wrap 1 hour prior to infusion appointments 30 g 3   lisinopril (PRINIVIL,ZESTRIL) 40 MG tablet Take 40 mg by mouth daily.     Omega-3 Fatty Acids (FISH OIL PO) Take by mouth.     PACLITAXEL IV Inject into the vein once a week.     Pembrolizumab (KEYTRUDA IV) Inject into the vein every 21 ( twenty-one) days.     prochlorperazine  (COMPAZINE) 10 MG tablet Take 1 tablet (10 mg total) by mouth every 6 (six) hours as needed (Nausea or vomiting). 30 tablet 1   psyllium (METAMUCIL) 58.6 % packet Take by mouth.     triamcinolone cream (KENALOG) 0.1 % SMARTSIG:1 Application Topical 2-3 Times Daily     Acetaminophen (TYLENOL PO) Take 1 tablet by mouth as needed. (Patient not taking: Reported on 04/02/2021)     Melatonin 5 MG TABS Take 5 mg by mouth. As needed for sleep (Patient not taking: Reported on 04/02/2021)     No current facility-administered medications for this visit.    REVIEW OF SYSTEMS:   Constitutional: Denies fevers, chills or abnormal night sweats Eyes: Denies blurriness of vision, double vision or watery eyes Ears, nose, mouth, throat, and face: Denies mucositis or sore throat Respiratory: Denies cough, dyspnea or wheezes Cardiovascular: Denies palpitation, chest discomfort or lower extremity swelling Gastrointestinal:  Denies nausea, heartburn or change in bowel habits Lymphatics: Denies new lymphadenopathy or easy bruising Neurological:Denies numbness, tingling or new weaknesses Behavioral/Psych: Mood is stable, no new changes  All other systems were reviewed with the patient and are negative.  PHYSICAL EXAMINATION: ECOG PERFORMANCE STATUS: 1 - Symptomatic but completely ambulatory GENERAL:alert, no distress and comfortable SKIN: Noted skin changes on both thighs consistent with livedo reticularis EYES: normal, conjunctiva are pink and non-injected, sclera clear OROPHARYNX:no exudate, normal lips, buccal mucosa, and tongue  NECK: supple, thyroid normal size, non-tender, without nodularity LYMPH:  no palpable lymphadenopathy in the cervical, axillary or inguinal LUNGS: clear to auscultation and percussion with normal breathing effort HEART: regular rate & rhythm and no murmurs without lower extremity edema ABDOMEN:abdomen soft, non-tender and normal bowel sounds Musculoskeletal:no cyanosis of digits and  no clubbing  PSYCH: alert & oriented x 3 with fluent speech NEURO: no focal motor/sensory deficits Bilateral breast is examined No abnormalities on the right breast No palpable adjacent lymphadenopathy on the supraclavicular or left axilla On the left breast, noted inverted nipple.  There is a palpable mass on the 12 o'clock position of the left breast, measures approximately 5 cm in size  LABORATORY DATA:  I have reviewed the data as listed Lab Results  Component Value Date   WBC 3.8 (L) 04/02/2021  HGB 11.9 (L) 04/02/2021   HCT 35.8 (L) 04/02/2021   MCV 99.2 04/02/2021   PLT 212 04/02/2021   Recent Labs    02/27/21 0827 03/06/21 0936 03/27/21 0927  NA 139 134* 138  K 3.8 3.8 3.9  CL 105 100 107  CO2 '28 27 25  ' GLUCOSE 111* 88 104*  BUN 16 24* 20  CREATININE 0.89 0.81 0.79  CALCIUM 8.9 9.0 8.7*  GFRNONAA >60 >60 >60  PROT 7.0 6.7 6.8  ALBUMIN 3.8 3.6 3.7  AST 17 49* 17  ALT 14 77* 15  ALKPHOS 68 54 60  BILITOT 0.5 1.0 0.6    RADIOGRAPHIC STUDIES: I have reviewed her prior PET CT scan I have personally reviewed the radiological images as listed and agreed with the findings in the report.   ASSESSMENT & PLAN:  Invasive lobular carcinoma of left breast in female [facility]) She is concerned about possible enlarging breast mass It is difficult for me to assess since this is the first time I examined her By my own measurement, the tumor measured approximately 5 cm in size There is discrepancy between multiple measurements taken from multiple different modalities Her mammogram from April suggested subcentimeter mass but smaller in masses noted on ultrasound Her latest PET CT scan from Feb 03, 2021 suggested the breast mass measured approximately 4.7 cm in size I could not palpate abnormal lymphadenopathy For now, I recommend we proceed with treatment as scheduled I will try to get a PET CT scan as soon as possible to determine whether she is responding to treatment or  not  Pancytopenia, acquired ([place]) She has mild pancytopenia due to treatment recently Repeat CBC from today show adequate neutrophil count We will proceed with treatment as scheduled  Livedo reticularis She has abnormal changes on her skin consistent with livedo reticularis but I do not believe this is caused by her recent treatment Observe closely for now  Orders Placed This Encounter  Procedures   NM PET Image Restage (PS) Skull Base to Thigh    Standing Status:   Future    Standing Expiration Date:   04/02/2022    Order Specific Question:   If indicated for the ordered procedure, I authorize the administration of a radiopharmaceutical per Radiology protocol    Answer:   Yes    Order Specific Question:   Preferred imaging location?    Answer:   [facility]    Order Specific Question:   Radiology Contrast Protocol - do NOT remove file path    Answer:   [email or web address]    All questions were answered. The patient knows to call the clinic with any problems, questions or concerns. The total time spent in the appointment was 30 minutes encounter with patients including review of chart and various tests results, discussions about plan of care and coordination of care plan   Heath Lark, MD 04/02/2021 4:40 PM

## 2021-04-02 NOTE — Progress Notes (Signed)
Spoke with daughter Almyra Free to advise per Dr Alvy Bimler, labs are such that she may receive treatment tomorrow and will not have to come early for labs.  Attempted to contact patient, however could not leave a VM.  States she will let her know.

## 2021-04-02 NOTE — Assessment & Plan Note (Signed)
She has abnormal changes on her skin consistent with livedo reticularis but I do not believe this is caused by her recent treatment Observe closely for now

## 2021-04-02 NOTE — Assessment & Plan Note (Signed)
She has mild pancytopenia due to treatment recently Repeat CBC from today show adequate neutrophil count We will proceed with treatment as scheduled

## 2021-04-03 ENCOUNTER — Telehealth (HOSPITAL_COMMUNITY): Payer: Medicare Other | Admitting: Hematology

## 2021-04-03 ENCOUNTER — Other Ambulatory Visit: Payer: Self-pay | Admitting: Hematology and Oncology

## 2021-04-03 ENCOUNTER — Other Ambulatory Visit (HOSPITAL_COMMUNITY): Payer: Medicare Other

## 2021-04-03 ENCOUNTER — Inpatient Hospital Stay (HOSPITAL_COMMUNITY): Payer: Medicare Other

## 2021-04-03 VITALS — BP 118/70 | HR 59 | Temp 98.1°F | Resp 18 | Wt 179.2 lb

## 2021-04-03 DIAGNOSIS — C50912 Malignant neoplasm of unspecified site of left female breast: Secondary | ICD-10-CM | POA: Diagnosis not present

## 2021-04-03 DIAGNOSIS — E039 Hypothyroidism, unspecified: Secondary | ICD-10-CM | POA: Diagnosis not present

## 2021-04-03 DIAGNOSIS — Z5112 Encounter for antineoplastic immunotherapy: Secondary | ICD-10-CM | POA: Diagnosis not present

## 2021-04-03 DIAGNOSIS — Z95828 Presence of other vascular implants and grafts: Secondary | ICD-10-CM

## 2021-04-03 DIAGNOSIS — Z171 Estrogen receptor negative status [ER-]: Secondary | ICD-10-CM | POA: Diagnosis not present

## 2021-04-03 DIAGNOSIS — J45909 Unspecified asthma, uncomplicated: Secondary | ICD-10-CM | POA: Diagnosis not present

## 2021-04-03 DIAGNOSIS — Z5111 Encounter for antineoplastic chemotherapy: Secondary | ICD-10-CM | POA: Diagnosis not present

## 2021-04-03 LAB — T4: T4, Total: 6.7 ug/dL (ref 4.5–12.0)

## 2021-04-03 MED ORDER — SODIUM CHLORIDE 0.9% FLUSH
10.0000 mL | INTRAVENOUS | Status: DC | PRN
  Administered 2021-04-03: 10 mL

## 2021-04-03 MED ORDER — SODIUM CHLORIDE 0.9 % IV SOLN: Freq: Once | INTRAVENOUS | Status: AC

## 2021-04-03 MED ORDER — HEPARIN SOD (PORK) LOCK FLUSH 100 UNIT/ML IV SOLN
500.0000 [IU] | Freq: Once | INTRAVENOUS | Status: AC | PRN
  Administered 2021-04-03: 500 [IU]

## 2021-04-03 MED ORDER — DIPHENHYDRAMINE HCL 50 MG/ML IJ SOLN
25.0000 mg | Freq: Once | INTRAMUSCULAR | Status: AC
  Administered 2021-04-03: 25 mg via INTRAVENOUS
  Filled 2021-04-03: qty 1

## 2021-04-03 MED ORDER — SODIUM CHLORIDE 0.9 % IV SOLN
80.0000 mg/m2 | Freq: Once | INTRAVENOUS | Status: AC
  Administered 2021-04-03: 156 mg via INTRAVENOUS
  Filled 2021-04-03: qty 26

## 2021-04-03 MED ORDER — SODIUM CHLORIDE 0.9 % IV SOLN
10.0000 mg | Freq: Once | INTRAVENOUS | Status: AC
  Administered 2021-04-03: 10 mg via INTRAVENOUS
  Filled 2021-04-03: qty 1

## 2021-04-03 MED ORDER — PALONOSETRON HCL INJECTION 0.25 MG/5ML
0.2500 mg | Freq: Once | INTRAVENOUS | Status: AC
  Administered 2021-04-03: 0.25 mg via INTRAVENOUS
  Filled 2021-04-03: qty 5

## 2021-04-03 MED ORDER — FAMOTIDINE 20 MG IN NS 100 ML IVPB
20.0000 mg | Freq: Once | INTRAVENOUS | Status: AC
  Administered 2021-04-03: 20 mg via INTRAVENOUS
  Filled 2021-04-03: qty 20

## 2021-04-03 MED ORDER — SODIUM CHLORIDE 0.9 % IV SOLN
136.8000 mg | Freq: Once | INTRAVENOUS | Status: AC
  Administered 2021-04-03: 140 mg via INTRAVENOUS
  Filled 2021-04-03: qty 14

## 2021-04-03 NOTE — Progress Notes (Signed)
Patient is here today for Taxol/Carbo infusion.  Patient complained of ringing/roaring in bilateral ears after treatments.  The ringing/roaring lasts several days and is accompanied by a headache.  I notified Dr. Alvy Bimler who seen this patient on 04/02/2021.  She stated it could be from chemo, but was doubtful since her dose was so small.  She recommended ENT referral if patient wanted it.   Dr. Alvy Bimler wants to proceed with treatment today.  Patient is in satisfactory condition today with stable vital signs.  Labs are also within normal limits.

## 2021-04-03 NOTE — Progress Notes (Signed)
Treatment given per orders. Patient tolerated it well without problems. Vitals stable and discharged home from clinic ambulatory. Follow up as scheduled.  

## 2021-04-03 NOTE — Patient Instructions (Signed)
[facility]  Discharge Instructions: Thank you for choosing [facility] to provide your oncology and hematology care.  If you have a lab appointment with the Cancer Center, please come in thru the Main Entrance and check in at the main information desk.  Wear comfortable clothing and clothing appropriate for easy access to any Portacath or PICC line.   We strive to give you quality time with your provider. You may need to reschedule your appointment if you arrive late (15 or more minutes).  Arriving late affects you and other patients whose appointments are after yours.  Also, if you miss three or more appointments without notifying the office, you may be dismissed from the clinic at the provider's discretion.      For prescription refill requests, have your pharmacy contact our office and allow 72 hours for refills to be completed.        To help prevent nausea and vomiting after your treatment, we encourage you to take your nausea medication as directed.  BELOW ARE SYMPTOMS THAT SHOULD BE REPORTED IMMEDIATELY: *FEVER GREATER THAN 100.4 F (38 C) OR HIGHER *CHILLS OR SWEATING *NAUSEA AND VOMITING THAT IS NOT CONTROLLED WITH YOUR NAUSEA MEDICATION *UNUSUAL SHORTNESS OF BREATH *UNUSUAL BRUISING OR BLEEDING *URINARY PROBLEMS (pain or burning when urinating, or frequent urination) *BOWEL PROBLEMS (unusual diarrhea, constipation, pain near the anus) TENDERNESS IN MOUTH AND THROAT WITH OR WITHOUT PRESENCE OF ULCERS (sore throat, sores in mouth, or a toothache) UNUSUAL RASH, SWELLING OR PAIN  UNUSUAL VAGINAL DISCHARGE OR ITCHING   Items with * indicate a potential emergency and should be followed up as soon as possible or go to the Emergency Department if any problems should occur.  Please show the CHEMOTHERAPY ALERT CARD or IMMUNOTHERAPY ALERT CARD at check-in to the Emergency Department and triage nurse.  Should you have questions after your visit or need to cancel  or reschedule your appointment, please contact [facility] [phone]  and follow the prompts.  Office hours are 8:00 a.m. to 4:30 p.m. Monday - Friday. Please note that voicemails left after 4:00 p.m. may not be returned until the following business day.  We are closed weekends and major holidays. You have access to a nurse at all times for urgent questions. Please call the main number to the clinic [phone] and follow the prompts.  For any non-urgent questions, you may also contact your provider using MyChart. We now offer e-Visits for anyone 18 and older to request care online for non-urgent symptoms. For details visit [email or web address].   Also download the MyChart app! Go to the app store, search "MyChart", open the app, select [facility], and log in with your MyChart username and password.  Due to Covid, a mask is required upon entering the hospital/clinic. If you do not have a mask, one will be given to you upon arrival. For doctor visits, patients may have 1 support person aged 18 or older with them. For treatment visits, patients cannot have anyone with them due to current Covid guidelines and our immunocompromised population.  

## 2021-04-04 ENCOUNTER — Ambulatory Visit (HOSPITAL_COMMUNITY)
Admission: RE | Admit: 2021-04-04 | Discharge: 2021-04-04 | Disposition: A | Discharge status: Home / Self Care | Payer: Medicare Other | Source: Ambulatory Visit | Attending: Hematology and Oncology | Admitting: Hematology and Oncology

## 2021-04-04 ENCOUNTER — Other Ambulatory Visit: Payer: Self-pay

## 2021-04-04 DIAGNOSIS — C50812 Malignant neoplasm of overlapping sites of left female breast: Secondary | ICD-10-CM | POA: Insufficient documentation

## 2021-04-04 DIAGNOSIS — Z79899 Other long term (current) drug therapy: Secondary | ICD-10-CM | POA: Insufficient documentation

## 2021-04-04 DIAGNOSIS — C50912 Malignant neoplasm of unspecified site of left female breast: Secondary | ICD-10-CM | POA: Diagnosis not present

## 2021-04-04 DIAGNOSIS — C50919 Malignant neoplasm of unspecified site of unspecified female breast: Secondary | ICD-10-CM | POA: Diagnosis not present

## 2021-04-04 LAB — GLUCOSE, CAPILLARY: Glucose-Capillary: 98 mg/dL (ref 70–99)

## 2021-04-04 IMAGING — CT NM PET TUM IMG RESTAG (PS) SKULL BASE T - THIGH
8 series · 25 of 25 positions shown · non-contrast
Comparison: [DATE], and CT abdomen from [DATE]

CLINICAL DATA: Subsequent treatment strategy for breast cancer.
Ongoing chemotherapy.

EXAM:
NUCLEAR MEDICINE PET SKULL BASE TO THIGH
TECHNIQUE: 8.9 mCi F-18 FDG was injected intravenously. Full-ring PET imaging
was performed from the skull base to thigh after the radiotracer. CT
data was obtained and used for attenuation correction and anatomic
localization.
Fasting blood glucose: 98 mg/dl

[Series 3: pet sk_thigh ac · axial · 5.0mm · 4.07mm/px · z∈[-858,+66]mm · 5 of 232 slices shown]
[im 1/232]
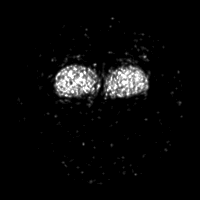
[im 58/232]
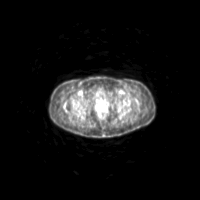
[im 116/232]
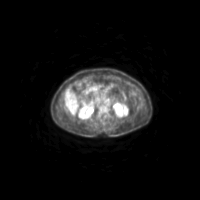
[im 174/232]
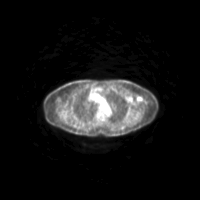
[im 232/232]
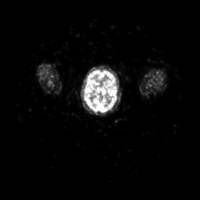

[Series 4: ct sk_thigh 5.0 bf37 · axial · 5.0mm · 0.98mm/px · z∈[-858,+66]mm · 5 of 232 slices shown]
[im 1/232]
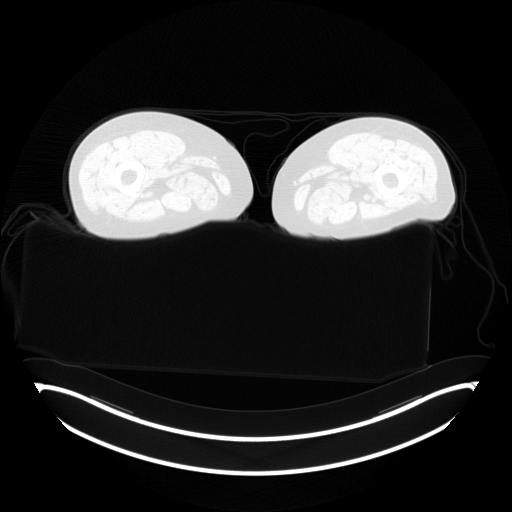
[im 58/232]
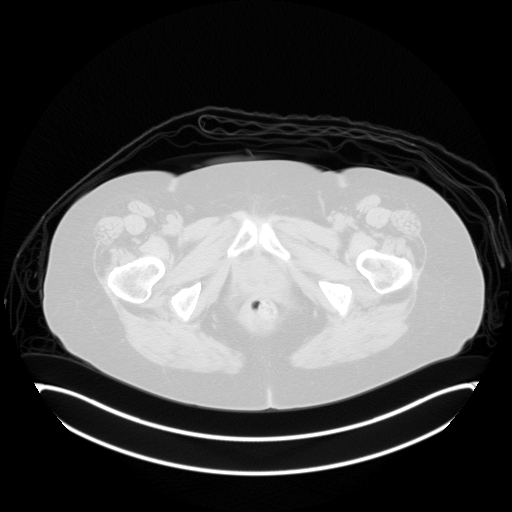
[im 116/232]
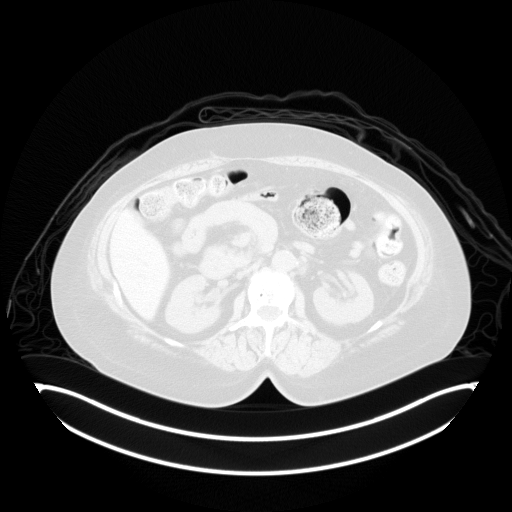
[im 174/232]
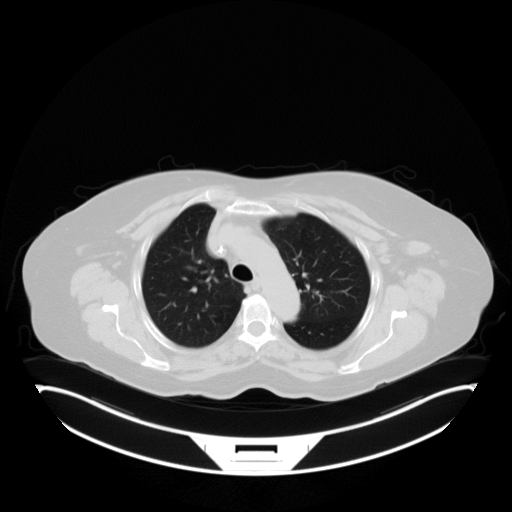
[im 232/232  brain]
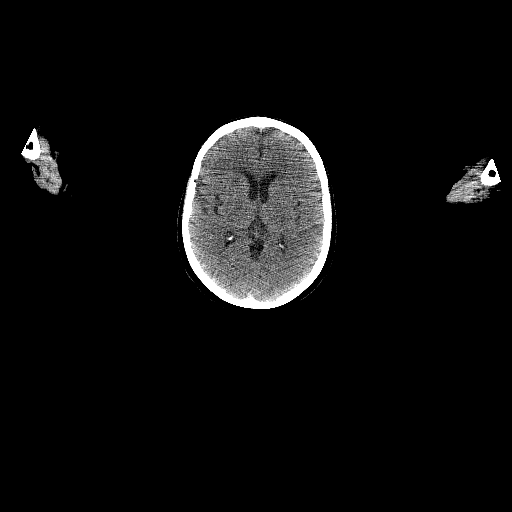

[Series 5: pet sk_thigh nac · axial · 5.0mm · 4.07mm/px · z∈[-858,+66]mm · 5 of 229 slices shown]
[im 1/229]
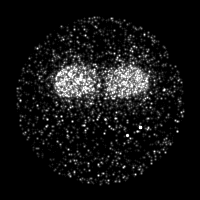
[im 58/229]
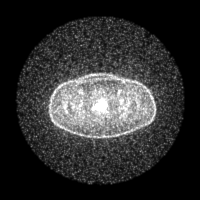
[im 115/229]
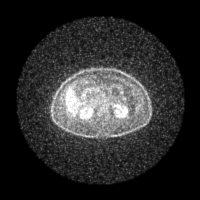
[im 172/229]
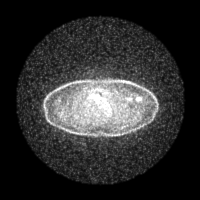
[im 229/229]
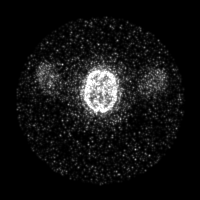

[Series 8: ct sk_thigh 5.0 br59 lung_bone · axial · 5.0mm · 0.68mm/px · z∈[-360,-96]mm · 2 of 67 slices shown]
[im 1/67]
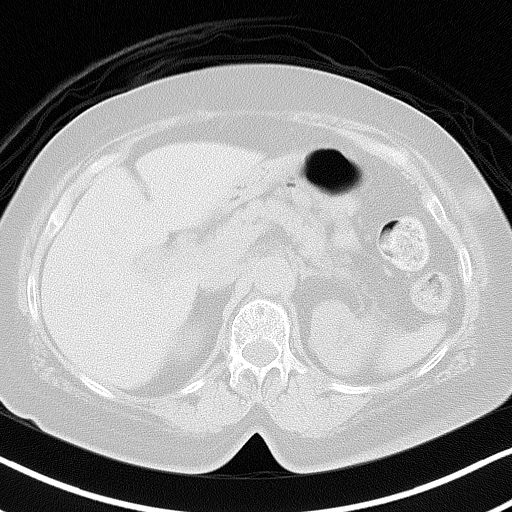
[im 67/67]
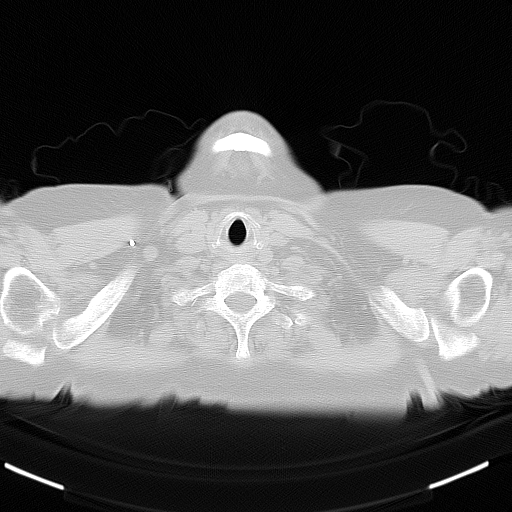

[Series 603: fused cor · 1 of 40 slices shown]
[im 1/40]
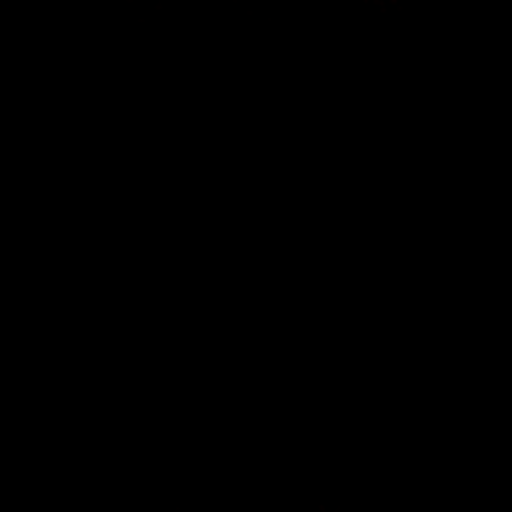

[Series 604: <mip collection> · coronal · 1.92mm/px · 1 of 32 slices shown]
[im 1/32]
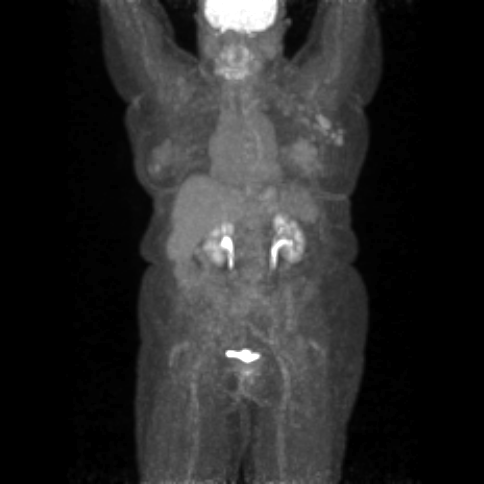

[Series 605: range-ac ct sk_thigh 5.0 hd_fov-tra-<alpha range> · 5 of 227 slices shown]
[im 1/227]
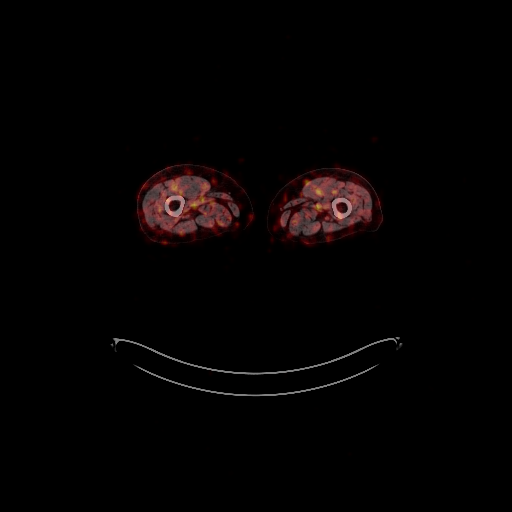
[im 57/227]
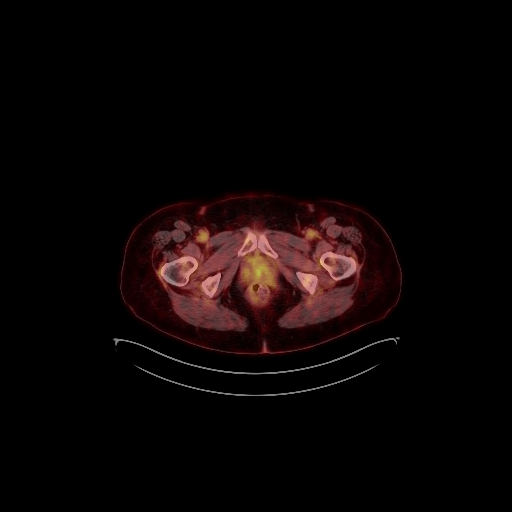
[im 114/227]
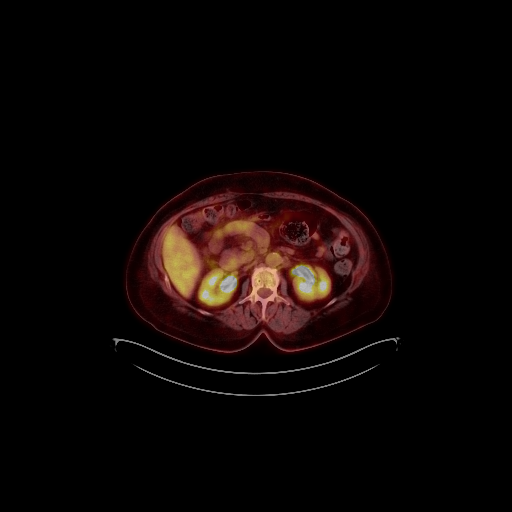
[im 170/227]
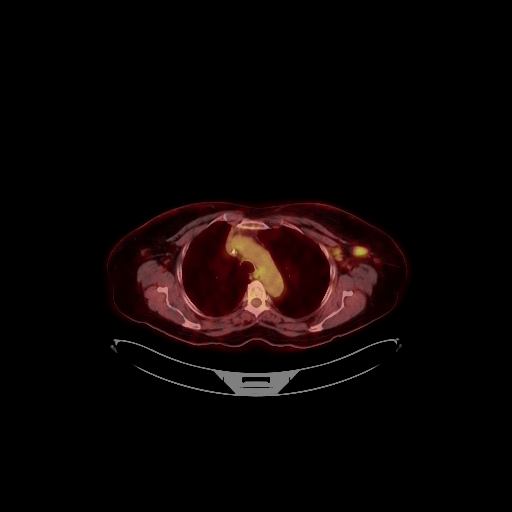
[im 227/227]
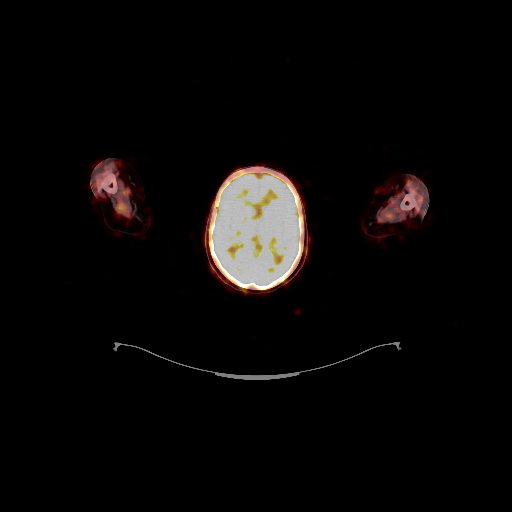

[Series 1216: results mm oncology reading · 5.0mm · 1.10mm/px · 1 of 7 slices shown]
[im 1/7]
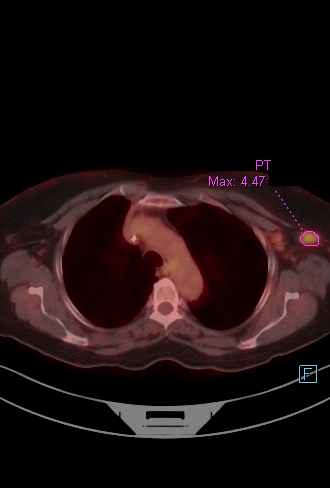

[25 of 25 positions shown; findings below may reference images not displayed]

FINDINGS: Mediastinal blood pool activity: SUV max

Liver activity: SUV max NA

NECK: No significant abnormal hypermetabolic activity in this
region.

Incidental CT findings: Right mastoid effusion.

CHEST: Index left axillary lymph node 1.3 cm in short axis on image
59 series 4 (formerly 1.8 cm), maximum SUV 4.5 (formerly 5.9).
Multiple other mildly hypermetabolic left axillary and subpectoral
lymph nodes are again observed, mildly reduced in size and activity
compared to previous.

Left lateral breast glandular tissues with maximum SUV 3.4 (formerly
4.0) as compared to contralateral side glandular tissues with
maximum SUV of 3.2.

Left lower lobe nodule measuring 1.0 by 1.0 cm on image 27 series 8
is stable, maximum SUV 0.8 (formerly 0.9). 5 by 4 mm right upper
lobe nodule on image 20 of series 8 demonstrates no significant
activity but is below sensitive PET-CT size thresholds.

Incidental CT findings: Stable tree-in-bud reticulonodular opacities
posteriorly in the left lower lobe on image 37 of series 8
characteristic of atypical infectious bronchiolitis. Calcified
granuloma in the left upper lobe. Ascending thoracic aortic aneurysm
4.1 cm in diameter, stable by my measurements.

ABDOMEN/PELVIS: No significant abnormal hypermetabolic activity in
this region.

Incidental CT findings: Sigmoid colon diverticulosis. Previous colon
wall thickening is no longer present. Sharply defined 2.9 by 2.2 cm
subcutaneous cystic lesion along the left abdomen on image 105 of
series 4 is not hypermetabolic and is likely a sebaceous cyst or
similar benign lesion.

SKELETON: No significant abnormal hypermetabolic activity in this
region.

Incidental CT findings: none
IMPRESSION: 1. Interval improvement with reduced size and activity of previous
left thoracic adenopathy, and mild reduction in activity of the left
lateral breast glandular tissues.
2. 1.0 cm left lower lobe nodule remains low in activity.
Surveillance may be warranted.
3. Ascending thoracic aortic aneurysm 4.1 cm in diameter. Recommend
annual imaging followup by CTA or MRA. This recommendation follows
[B4] ACCF/AHA/AATS/ACR/ASA/SCA/RUDI/RUDI/RUDI/RUDI Guidelines for the
Diagnosis and Management of Patients with Thoracic Aortic Disease.
Circulation. [B4]; 121: [id number]. Aortic aneurysm NOS ([B4]-[B4])
4. Other imaging findings of potential clinical significance: Right
mastoid effusion. Atypical infectious bronchiolitis in the left
lower lobe, unchanged. Old granulomatous disease. Sigmoid colon
diverticulosis.

## 2021-04-04 MED ORDER — FLUDEOXYGLUCOSE F - 18 (FDG) INJECTION
8.9000 | Freq: Once | INTRAVENOUS | Status: AC | PRN
  Administered 2021-04-04: 8.9 via INTRAVENOUS

## 2021-04-09 NOTE — Progress Notes (Signed)
[address], [place] [address]   CLINIC:  Medical Oncology/Hematology  PCP:  Neale Burly, MD [place] / [place] [phone]   REASON FOR VISIT:  Follow-up for left breast cancer  PRIOR THERAPY: none  NGS Results: not done  CURRENT THERAPY:  weekly carboplatin and paclitaxel along with pembrolizumab  BRIEF ONCOLOGIC HISTORY:  Oncology History  Invasive lobular carcinoma of left breast in female [facility])  01/23/2021 Initial Diagnosis   Invasive lobular carcinoma of left breast in female [facility])   02/19/2021 Genetic Testing   Negative genetic testing on the CancerNext-Expanded+RNAinsight panel.  SMARCB1 VUS identified.  The CancerNext-Expanded gene panel offered by [facility] and includes sequencing and rearrangement analysis for the following 77 genes: AIP, ALK, APC*, ATM*, AXIN2, BAP1, BARD1, BLM, BMPR1A, BRCA1*, BRCA2*, BRIP1*, CDC73, CDH1*, CDK4, CDKN1B, CDKN2A, CHEK2*, CTNNA1, DICER1, FANCC, FH, FLCN, GALNT12, KIF1B, LZTR1, MAX, MEN1, MET, MLH1*, MSH2*, MSH3, MSH6*, MUTYH*, NBN, NF1*, NF2, NTHL1, PALB2*, PHOX2B, PMS2*, POT1, PRKAR1A, PTCH1, PTEN*, RAD51C*, RAD51D*, RB1, RECQL, RET, SDHA, SDHAF2, SDHB, SDHC, SDHD, SMAD4, SMARCA4, SMARCB1, SMARCE1, STK11, SUFU, TMEM127, TP53*, TSC1, TSC2, VHL and XRCC2 (sequencing and deletion/duplication); EGFR, EGLN1, HOXB13, KIT, MITF, PDGFRA, POLD1, and POLE (sequencing only); EPCAM and GREM1 (deletion/duplication only). DNA and RNA analyses performed for * genes. The report date is February 19, 2021.   02/27/2021 -  Chemotherapy    Patient is on Treatment Plan: BREAST PEMBROLIZUMAB + CARBOPLATIN D1,8,15+ PACLITAXEL D1,8,15 Q21D X 4 CYCLES / PEMBROLIZUMAB + AC Q21D X 4 CYCLES         CANCER STAGING: Cancer Staging Invasive lobular carcinoma of left breast in female [facility]) Staging form: Breast, AJCC 8th Edition - Clinical stage from 01/23/2021: cT3, cN3c, G2, ER-, PR-, HER2- -  Unsigned   INTERVAL HISTORY:  Denise [name], a 70 y.o. female, returns for routine follow-up and consideration for next cycle of chemotherapy. [name] was last seen on 03/27/21.  Due for cycle #2 of carboplatin, paclitaxel, and pembrolizumab today.   Overall, she tells me she has been feeling pretty well. She reports that she is tolerating treatment well, and she denies n/v/d, tingling and numbness, fatigue, reduced appetite, mouth sores, and ankle swellings. 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   Overall, she feels ready for next cycle of chemo today.   REVIEW OF SYSTEMS:  Review of Systems  Constitutional:  Negative for appetite change and fatigue.  HENT:   Negative for mouth sores.   Cardiovascular:  Negative for leg swelling.  Gastrointestinal:  Negative for diarrhea, nausea and vomiting.  Neurological:  Negative for numbness.  All other systems reviewed and are negative.  PAST MEDICAL/SURGICAL HISTORY:  Past Medical History:  Diagnosis Date   Asthma    as child   Cancer ([place]) 12/2020   left breast IMC   Complication of anesthesia    pateitn states,' I coded when I had my D&Cmany years ago.   Family history of breast cancer    Hypertension    Hypothyroidism    PONV (postoperative nausea and vomiting)    Port-A-Cath in place 02/26/2021   Past Surgical History:  Procedure Laterality Date   ABDOMINAL HYSTERECTOMY     BIOPSY  01/17/2018   Procedure: BIOPSY;  Surgeon: Rogene Houston, MD;  [facility];  Service: Endoscopy;;  duodenum,gastric  CHOLECYSTECTOMY     COLONOSCOPY WITH PROPOFOL N/A 01/17/2018   Procedure: COLONOSCOPY WITH PROPOFOL;  Surgeon: Rogene Houston, MD;  [facility];  Service: Endoscopy;  Laterality: N/A;  7:30   DILATION AND CURETTAGE OF UTERUS     ESOPHAGOGASTRODUODENOSCOPY (EGD) WITH PROPOFOL N/A 01/17/2018   Procedure:  ESOPHAGOGASTRODUODENOSCOPY (EGD) WITH PROPOFOL;  Surgeon: Rogene Houston, MD;  [facility];  Service: Endoscopy;  Laterality: N/A;   POLYPECTOMY  01/17/2018   Procedure: POLYPECTOMY;  Surgeon: Rogene Houston, MD;  [facility];  Service: Endoscopy;;  transverse colon, cecal   PORTACATH PLACEMENT Right 02/17/2021   Procedure: INSERTION PORT-A-CATH;  Surgeon: Coralie Keens, MD;  Location: [facility];  Service: General;  Laterality: Right;    SOCIAL HISTORY:  Social History   Socioeconomic History   Marital status: Widowed    Spouse name: Not on file   Number of children: 3   Years of education: Not on file   Highest education level: Not on file  Occupational History   Occupation: [employer]   Occupation: retired  Tobacco Use   Smoking status: Never   Smokeless tobacco: Never  Scientific laboratory technician Use: Never used  Substance and Sexual Activity   Alcohol use: Never   Drug use: Never   Sexual activity: Not Currently    Birth control/protection: Surgical  Other Topics Concern   Not on file  Social History Narrative   Not on file   Social Determinants of Health   Financial Resource Strain: Low Risk    Difficulty of Paying Living Expenses: Not hard at all  Food Insecurity: No Food Insecurity   Worried About Charity fundraiser in the Last Year: Never true   [place] in the Last Year: Never true  Transportation Needs: No Transportation Needs   Lack of Transportation (Medical): No   Lack of Transportation (Non-Medical): No  Physical Activity: Insufficiently Active   Days of Exercise per Week: 3 days   Minutes of Exercise per Session: 30 min  Stress: No Stress Concern Present   Feeling of Stress : Not at all  Social Connections: Moderately Integrated   Frequency of Communication with Friends and Family: More than three times a week   Frequency of Social Gatherings with Friends and Family: More than three times a  week   Attends Religious Services: More than 4 times per year   Active Member of Clubs or Organizations: No   Attends Music therapist: More than 4 times per year   Marital Status: Widowed  Human resources officer Violence: Not At Risk   Fear of Current or Ex-Partner: No   Emotionally Abused: No   Physically Abused: No   Sexually Abused: No    FAMILY HISTORY:  Family History  Problem Relation Age of Onset   Breast cancer Mother        dx in her 25s   Heart disease Mother    Thyroid disease Mother    Lung cancer Father        dx in his 26s   Thyroid disease Maternal Aunt    Thyroid nodules Maternal Grandmother 35       goiter   Thyroid disease Maternal Grandmother    Heart disease Maternal Grandfather    Heart disease Paternal Grandmother    Heart disease Paternal Grandfather    Thyroid disease Daughter    Thyroid disease Daughter  Cancer Maternal Uncle        NOS   Cancer Paternal Uncle        NOS   Breast cancer Cousin        pat first cousin died in her 62s;     CURRENT MEDICATIONS:  Current Outpatient Medications  Medication Sig Dispense Refill   Acetaminophen (TYLENOL PO) Take 1 tablet by mouth as needed.     albuterol (VENTOLIN HFA) 108 (90 Base) MCG/ACT inhaler Inhale into the lungs.     benazepril (LOTENSIN) 20 MG tablet Take 20 mg by mouth daily.     CARBOPLATIN IV Inject into the vein once a week.     doxepin (SINEQUAN) 10 MG capsule Take 10 mg by mouth at bedtime.     gabapentin (NEURONTIN) 300 MG capsule Take 1 capsule by mouth at bedtime.     hydrochlorothiazide (MICROZIDE) 12.5 MG capsule Take 12.5 mg by mouth daily.     levothyroxine (SYNTHROID) 75 MCG tablet Take 100 mcg by mouth daily before breakfast.     lidocaine-prilocaine (EMLA) cream Apply a small amount to port a cath site and cover with plastic wrap 1 hour prior to infusion appointments 30 g 3   lisinopril (PRINIVIL,ZESTRIL) 40 MG tablet Take 40 mg by mouth daily.     Melatonin 5 MG  TABS Take 5 mg by mouth. As needed for sleep     Omega-3 Fatty Acids (FISH OIL PO) Take by mouth.     PACLITAXEL IV Inject into the vein once a week.     Pembrolizumab (KEYTRUDA IV) Inject into the vein every 21 ( twenty-one) days.     prochlorperazine (COMPAZINE) 10 MG tablet Take 1 tablet (10 mg total) by mouth every 6 (six) hours as needed (Nausea or vomiting). 30 tablet 1   psyllium (METAMUCIL) 58.6 % packet Take by mouth.     triamcinolone cream (KENALOG) 0.1 % SMARTSIG:1 Application Topical 2-3 Times Daily     No current facility-administered medications for this visit.    ALLERGIES:  Allergies  Allergen Reactions   Exforge [Amlodipine Besylate-Valsartan] Nausea And Vomiting   Cheese    Strawberry Extract Diarrhea    Seeds,nuts, lettuce, grapes   Tramadol Hcl Nausea And Vomiting   Yeast-Related Products Hives    bread    PHYSICAL EXAM:  Performance status (ECOG): 1 - Symptomatic but completely ambulatory  There were no vitals filed for this visit. Wt Readings from Last 3 Encounters:  04/03/21 179 lb 3.2 oz (81.3 kg)  03/27/21 178 lb (80.7 kg)  03/06/21 172 lb 11.2 oz (78.3 kg)   Physical Exam Vitals reviewed.  Constitutional:      Appearance: Normal appearance.  Cardiovascular:     Rate and Rhythm: Normal rate and regular rhythm.     Pulses: Normal pulses.     Heart sounds: Normal heart sounds.  Pulmonary:     Effort: Pulmonary effort is normal.     Breath sounds: Normal breath sounds.  Musculoskeletal:     Right lower leg: No edema.     Left lower leg: No edema.  Neurological:     General: No focal deficit present.     Mental Status: She is alert and oriented to person, place, and time.  Psychiatric:        Mood and Affect: Mood normal.        Behavior: Behavior normal.    LABORATORY DATA:  I have reviewed the labs as listed.  CBC Latest Ref  Rng & Units 04/02/2021 03/27/2021 03/06/2021  WBC 4.0 - 10.5 K/uL 3.8(L) 3.1(L) 5.9  Hemoglobin 12.0 - 15.0 g/dL  11.9(L) 12.0 12.5  Hematocrit 36.0 - 46.0 % 35.8(L) 37.1 37.1  Platelets 150 - 400 K/uL 212 199 281   CMP Latest Ref Rng & Units 04/02/2021 03/27/2021 03/06/2021  Glucose 70 - 99 mg/dL 91 104(H) 88  BUN 8 - 23 mg/dL 19 20 24(H)  Creatinine 0.44 - 1.00 mg/dL 0.81 0.79 0.81  Sodium 135 - 145 mmol/L 134(L) 138 134(L)  Potassium 3.5 - 5.1 mmol/L 4.5 3.9 3.8  Chloride 98 - 111 mmol/L 102 107 100  CO2 22 - 32 mmol/L _0 Calcium 8.9 - 10.3 mg/dL 8.9 8.7(L) 9.0  Total Protein 6.5 - 8.1 g/dL 7.3 6.8 6.7  Total Bilirubin 0.3 - 1.2 mg/dL 0.7 0.6 1.0  Alkaline Phos 38 - 126 U/L 62 60 54  AST 15 - 41 U/L 20 17 49(H)  ALT 0 - 44 U/L 17 15 77(H)    DIAGNOSTIC IMAGING:  I have independently reviewed the scans and discussed with the patient. NM PET Image Restage (PS) Skull Base to Thigh  Result Date: 04/06/2021 CLINICAL DATA:  Subsequent treatment strategy for breast cancer. Ongoing chemotherapy. EXAM: NUCLEAR MEDICINE PET SKULL BASE TO THIGH TECHNIQUE: 8.9 mCi F-18 FDG was injected intravenously. Full-ring PET imaging was performed from the skull base to thigh after the radiotracer. CT data was obtained and used for attenuation correction and anatomic localization. Fasting blood glucose: 98 mg/dl COMPARISON:  02/03/2021, and CT abdomen from 03/02/2021 FINDINGS: Mediastinal blood pool activity: SUV max 2.7 Liver activity: SUV max NA NECK: No significant abnormal hypermetabolic activity in this region. Incidental CT findings: Right mastoid effusion. CHEST: Index left axillary lymph node 1.3 cm in short axis on image 59 series 4 (formerly 1.8 cm), maximum SUV 4.5 (formerly 5.9). Multiple other mildly hypermetabolic left axillary and subpectoral lymph nodes are again observed, mildly reduced in size and activity compared to previous. Left lateral breast glandular tissues with maximum SUV 3.4 (formerly 4.0) as compared to contralateral side glandular tissues with maximum SUV of 3.2. Left lower lobe nodule  measuring 1.0 by 1.0 cm on image 27 series 8 is stable, maximum SUV 0.8 (formerly 0.9). 5 by 4 mm right upper lobe nodule on image 20 of series 8 demonstrates no significant activity but is below sensitive PET-CT size thresholds. Incidental CT findings: Stable tree-in-bud reticulonodular opacities posteriorly in the left lower lobe on image 37 of series 8 characteristic of atypical infectious bronchiolitis. Calcified granuloma in the left upper lobe. Ascending thoracic aortic aneurysm 4.1 cm in diameter, stable by my measurements. ABDOMEN/PELVIS: No significant abnormal hypermetabolic activity in this region. Incidental CT findings: Sigmoid colon diverticulosis. Previous colon wall thickening is no longer present. Sharply defined 2.9 by 2.2 cm subcutaneous cystic lesion along the left abdomen on image 105 of series 4 is not hypermetabolic and is likely a sebaceous cyst or similar benign lesion. SKELETON: No significant abnormal hypermetabolic activity in this region. Incidental CT findings: none IMPRESSION: 1. Interval improvement with reduced size and activity of previous left thoracic adenopathy, and mild reduction in activity of the left lateral breast glandular tissues. 2. 1.0 cm left lower lobe nodule remains low in activity. Surveillance may be warranted. 3. Ascending thoracic aortic aneurysm 4.1 cm in diameter. Recommend annual imaging followup by CTA or MRA. This recommendation follows 2010 ACCF/AHA/AATS/ACR/ASA/SCA/SCAI/SIR/STS/SVM Guidelines for the Diagnosis and Management of Patients with Thoracic Aortic Disease.  Circulation. 2010; 121: B284-X324. Aortic aneurysm NOS (ICD10-I71.9) 4. Other imaging findings of potential clinical significance: Right mastoid effusion. Atypical infectious bronchiolitis in the left lower lobe, unchanged. Old granulomatous disease. Sigmoid colon diverticulosis. Electronically Signed   By: Van Clines M.D.   On: 04/06/2021 11:05     ASSESSMENT:  1.  T3N3c (stage IIIc)  triple negative invasive lobular carcinoma of the left breast: - She felt lump in her left breast for more than 6 months, but thought it was secondary to fibrocystic disease which she had all her life.  When she started having pain, she reached out to Dr. Sherrie Sport. - She previously had mammogram machine malfunction and had severe pain and traumatized by that experience.  She was having ultrasound of the breast every other year since then. - She was on hormone replacement therapy for close to 10 years (from age [age]). - Ultrasound of the left breast on 01/08/2021 showed hyper vascular hypoechoic mass at 12 o'clock position measuring 3.8 x 1.3 x 2.5 cm.  There are calcifications located within the mass.  Mass extends to the level of the skin with mild associated skin thickening.  At least 3 morphologically abnormal lymph nodes in the left axilla. - Mammogram showed irregular spiculated mass with associated calcifications in the retroareolar to upper left breast measuring approximately 6 cm.  There are at least 4 morphologically abnormal lymph nodes identified in the left axilla. - Ultrasound-guided left breast and left axillary lymph node biopsy on 01/15/2021 - Pathology consistent with invasive lobular carcinoma, E-cadherin negative.  ER/PR/HER2 negative.  HER2 2+ by IHC, negative by FISH.  Ki-67 is 5%.  Lymph node core biopsy was consistent with metastatic carcinoma.  Grade 2. - PET scan on 02/03/2021 showed involvement of left supraclavicular, subpectoral, axillary lymph nodes along with the breast mass.  10 mm left lung nodule which is hypometabolic.  Spinal cord lesion at T12 level with a strong uptake. - MRI of the lumbar spine with and without contrast on 02/20/2021 showed no mass or abnormal enhancement within the canal at the T12 level to correspond to the site of PET scan positive.  No marrow replacing bone lesion. - 2D echo on 02/21/2021 with EF 55-60%. - Weekly carboplatin and paclitaxel with every 3  weeks pembrolizumab ([id number]) started on 02/27/2021.   2.  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  She is non-smoker. - Mother died of breast cancer.  Maternal grandmother died very young in her 46s, sister has fibrocystic disease.  Father died of lung cancer and was a smoker.   PLAN:  1.  T3N3c triple negative invasive lobular carcinoma of the left breast: - She has tolerated weekly carboplatin and paclitaxel very well last week.  She has completed 3 weekly doses. - She does not report any neuropathy symptoms.  No GI side effects noted. - There was a question of increasing  breast mass at last visit.  Hence a PET scan was ordered.  I have reviewed PET scan from 04/04/2021 which showed interval improvement with reduced size and activity of previous left thoracic adenopathy and mild reduction in activity of the left lateral breast glandular tissues.  1 cm left lower lobe nodule remains low in activity.  No new areas were seen. - Reviewed her labs today.  LFTs and creatinine were within normal limits.  White count is low at 2.1 with ANC of 0.8.  Platelet count is normal. - We will  hold her treatment today.  She will receive G-CSF 480 mcg today.  We will check ANC tomorrow.  If it is more than 1200, we will proceed with her next cycle. - I have recommended her to check her labs on Wednesday to see if she needs G-CSF prior to each treatment weekly. 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 - I plan to see her back in 2 weeks for follow-up.  2.  Hypothyroidism: - TSH today is 3.26.  Continue Synthroid 75 mcg daily.  We will closely monitor.   Orders placed this encounter:  No orders of the defined types were placed in this encounter.    Derek Jack, MD [place] [phone]   I, Thana Ates, am acting as a scribe for Dr. Derek Jack.  I, Derek Jack MD,  have reviewed the above documentation for accuracy and completeness, and I agree with the above.

## 2021-04-10 ENCOUNTER — Inpatient Hospital Stay (HOSPITAL_COMMUNITY): Payer: Medicare Other

## 2021-04-10 ENCOUNTER — Inpatient Hospital Stay (HOSPITAL_BASED_OUTPATIENT_CLINIC_OR_DEPARTMENT_OTHER): Payer: Medicare Other | Admitting: Hematology

## 2021-04-10 ENCOUNTER — Other Ambulatory Visit: Payer: Self-pay

## 2021-04-10 VITALS — Wt 179.0 lb

## 2021-04-10 DIAGNOSIS — E039 Hypothyroidism, unspecified: Secondary | ICD-10-CM | POA: Diagnosis not present

## 2021-04-10 DIAGNOSIS — Z171 Estrogen receptor negative status [ER-]: Secondary | ICD-10-CM | POA: Diagnosis not present

## 2021-04-10 DIAGNOSIS — J45909 Unspecified asthma, uncomplicated: Secondary | ICD-10-CM | POA: Diagnosis not present

## 2021-04-10 DIAGNOSIS — C50912 Malignant neoplasm of unspecified site of left female breast: Secondary | ICD-10-CM

## 2021-04-10 DIAGNOSIS — Z5112 Encounter for antineoplastic immunotherapy: Secondary | ICD-10-CM | POA: Diagnosis not present

## 2021-04-10 DIAGNOSIS — D702 Other drug-induced agranulocytosis: Secondary | ICD-10-CM

## 2021-04-10 DIAGNOSIS — Z5111 Encounter for antineoplastic chemotherapy: Secondary | ICD-10-CM | POA: Diagnosis not present

## 2021-04-10 LAB — CBC WITH DIFFERENTIAL/PLATELET
Abs Immature Granulocytes: 0.01 10*3/uL (ref 0.00–0.07)
Basophils Absolute: 0 10*3/uL (ref 0.0–0.1)
Basophils Relative: 1 %
Eosinophils Absolute: 0.1 10*3/uL (ref 0.0–0.5)
Eosinophils Relative: 5 %
HCT: 32.8 % — ABNORMAL LOW (ref 36.0–46.0)
Hemoglobin: 10.8 g/dL — ABNORMAL LOW (ref 12.0–15.0)
Immature Granulocytes: 0 %
Lymphocytes Relative: 50 %
Lymphs Abs: 1.1 10*3/uL (ref 0.7–4.0)
MCH: 32.9 pg (ref 26.0–34.0)
MCHC: 32.9 g/dL (ref 30.0–36.0)
MCV: 100 fL (ref 80.0–100.0)
Monocytes Absolute: 0.2 10*3/uL (ref 0.1–1.0)
Monocytes Relative: 8 %
Neutro Abs: 0.8 10*3/uL — ABNORMAL LOW (ref 1.7–7.7)
Neutrophils Relative %: 37 %
Platelets: 249 10*3/uL (ref 150–400)
RBC: 3.28 MIL/uL — ABNORMAL LOW (ref 3.87–5.11)
RDW: 12.7 % (ref 11.5–15.5)
WBC: 2.1 10*3/uL — ABNORMAL LOW (ref 4.0–10.5)
nRBC: 0 % (ref 0.0–0.2)
nRBC: 0 /100 WBC

## 2021-04-10 LAB — COMPREHENSIVE METABOLIC PANEL
ALT: 14 U/L (ref 0–44)
AST: 17 U/L (ref 15–41)
Albumin: 3.5 g/dL (ref 3.5–5.0)
Alkaline Phosphatase: 53 U/L (ref 38–126)
Anion gap: 5 (ref 5–15)
BUN: 17 mg/dL (ref 8–23)
CO2: 26 mmol/L (ref 22–32)
Calcium: 8.7 mg/dL — ABNORMAL LOW (ref 8.9–10.3)
Chloride: 106 mmol/L (ref 98–111)
Creatinine, Ser: 0.71 mg/dL (ref 0.44–1.00)
GFR, Estimated: >60 mL/min (ref >60)
Glucose, Bld: 100 mg/dL — ABNORMAL HIGH (ref 70–99)
Potassium: 3.8 mmol/L (ref 3.5–5.1)
Sodium: 137 mmol/L (ref 135–145)
Total Bilirubin: 0.7 mg/dL (ref 0.3–1.2)
Total Protein: 6.4 g/dL — ABNORMAL LOW (ref 6.5–8.1)

## 2021-04-10 LAB — TSH: TSH: 3.266 u[IU]/mL (ref 0.350–4.500)

## 2021-04-10 LAB — MAGNESIUM: Magnesium: 1.7 mg/dL (ref 1.7–2.4)

## 2021-04-10 MED ORDER — SODIUM CHLORIDE 0.9% FLUSH
10.0000 mL | INTRAVENOUS | Status: DC | PRN
  Administered 2021-04-10: 10 mL via INTRAVENOUS

## 2021-04-10 MED ORDER — FILGRASTIM-SNDZ 480 MCG/0.8ML IJ SOSY
480.0000 ug | PREFILLED_SYRINGE | Freq: Once | INTRAMUSCULAR | Status: AC
  Administered 2021-04-10: 480 ug via SUBCUTANEOUS
  Filled 2021-04-10: qty 0.8

## 2021-04-10 MED ORDER — HEPARIN SOD (PORK) LOCK FLUSH 100 UNIT/ML IV SOLN
500.0000 [IU] | Freq: Once | INTRAVENOUS | Status: AC
  Administered 2021-04-10: 500 [IU] via INTRAVENOUS

## 2021-04-10 NOTE — Progress Notes (Signed)
Labs reviewed today hold treatment today per Dr. Delton Coombes. Pt will receive Zarxio today and follow-up as scheduled.  Denise [name] presents today for injection per the provider's orders.  Zarxio administration without incident; injection site WNL; see MAR for injection details.  Patient tolerated procedure well and without incident.  No questions or complaints noted at this time.

## 2021-04-10 NOTE — Progress Notes (Signed)
Late entry- patient was discharged from exam room today. Follow up visit assessment was done.

## 2021-04-10 NOTE — Patient Instructions (Addendum)
[facility] Discharge Instructions  You were seen today by Dr. Katragadda. He went over your recent results and scans. Dr. Katragadda will see you back in 2 weeks for labs and follow up.   Thank you for choosing [facility] at [facility] to provide your oncology and hematology care.  To afford each patient quality time with our provider, please arrive at least 15 minutes before your scheduled appointment time.   If you have a lab appointment with the Cancer Center please come in thru the Main Entrance and check in at the main information desk  You need to re-schedule your appointment should you arrive 10 or more minutes late.  We strive to give you quality time with our providers, and arriving late affects you and other patients whose appointments are after yours.  Also, if you no show three or more times for appointments you may be dismissed from the clinic at the providers discretion.     Again, thank you for choosing [facility].  Our hope is that these requests will decrease the amount of time that you wait before being seen by our physicians.       _____________________________________________________________  Should you have questions after your visit to [facility], please contact our office at [phone] between the hours of 8:00 a.m. and 4:30 p.m.  Voicemails left after 4:00 p.m. will not be returned until the following business day.  For prescription refill requests, have your pharmacy contact our office and allow 72 hours.    Cancer Center Support Programs:   > Cancer Support Group  2nd Tuesday of the month 1pm-2pm, Journey Room    

## 2021-04-11 ENCOUNTER — Other Ambulatory Visit (HOSPITAL_COMMUNITY): Payer: Self-pay | Admitting: *Deleted

## 2021-04-11 ENCOUNTER — Inpatient Hospital Stay (HOSPITAL_COMMUNITY): Payer: Medicare Other

## 2021-04-11 VITALS — BP 137/81 | HR 69 | Temp 98.6°F | Resp 16

## 2021-04-11 DIAGNOSIS — Z171 Estrogen receptor negative status [ER-]: Secondary | ICD-10-CM | POA: Diagnosis not present

## 2021-04-11 DIAGNOSIS — Z95828 Presence of other vascular implants and grafts: Secondary | ICD-10-CM

## 2021-04-11 DIAGNOSIS — C50912 Malignant neoplasm of unspecified site of left female breast: Secondary | ICD-10-CM

## 2021-04-11 DIAGNOSIS — J45909 Unspecified asthma, uncomplicated: Secondary | ICD-10-CM | POA: Diagnosis not present

## 2021-04-11 DIAGNOSIS — Z5111 Encounter for antineoplastic chemotherapy: Secondary | ICD-10-CM | POA: Diagnosis not present

## 2021-04-11 DIAGNOSIS — E039 Hypothyroidism, unspecified: Secondary | ICD-10-CM | POA: Diagnosis not present

## 2021-04-11 DIAGNOSIS — Z5112 Encounter for antineoplastic immunotherapy: Secondary | ICD-10-CM | POA: Diagnosis not present

## 2021-04-11 LAB — CBC WITH DIFFERENTIAL/PLATELET
Band Neutrophils: 6 %
Basophils Absolute: 0 10*3/uL (ref 0.0–0.1)
Basophils Relative: 0 %
Eosinophils Absolute: 0 10*3/uL (ref 0.0–0.5)
Eosinophils Relative: 0 %
HCT: 32.3 % — ABNORMAL LOW (ref 36.0–46.0)
Hemoglobin: 10.8 g/dL — ABNORMAL LOW (ref 12.0–15.0)
Lymphocytes Relative: 20 %
Lymphs Abs: 2 10*3/uL (ref 0.7–4.0)
MCH: 33.6 pg (ref 26.0–34.0)
MCHC: 33.4 g/dL (ref 30.0–36.0)
MCV: 100.6 fL — ABNORMAL HIGH (ref 80.0–100.0)
Monocytes Absolute: 0.9 10*3/uL (ref 0.1–1.0)
Monocytes Relative: 9 %
Neutro Abs: 7 10*3/uL (ref 1.7–7.7)
Neutrophils Relative %: 65 %
Platelets: 234 10*3/uL (ref 150–400)
RBC: 3.21 MIL/uL — ABNORMAL LOW (ref 3.87–5.11)
RDW: 13 % (ref 11.5–15.5)
WBC: 9.9 10*3/uL (ref 4.0–10.5)
nRBC: 0 % (ref 0.0–0.2)

## 2021-04-11 MED ORDER — SODIUM CHLORIDE 0.9 % IV SOLN
80.0000 mg/m2 | Freq: Once | INTRAVENOUS | Status: AC
  Administered 2021-04-11: 156 mg via INTRAVENOUS
  Filled 2021-04-11: qty 26

## 2021-04-11 MED ORDER — FAMOTIDINE 20 MG IN NS 100 ML IVPB
20.0000 mg | Freq: Once | INTRAVENOUS | Status: AC
  Administered 2021-04-11: 20 mg via INTRAVENOUS
  Filled 2021-04-11: qty 20

## 2021-04-11 MED ORDER — SODIUM CHLORIDE 0.9 % IV SOLN
136.8000 mg | Freq: Once | INTRAVENOUS | Status: AC
  Administered 2021-04-11: 140 mg via INTRAVENOUS
  Filled 2021-04-11: qty 14

## 2021-04-11 MED ORDER — PALONOSETRON HCL INJECTION 0.25 MG/5ML
0.2500 mg | Freq: Once | INTRAVENOUS | Status: AC
  Administered 2021-04-11: 0.25 mg via INTRAVENOUS
  Filled 2021-04-11: qty 5

## 2021-04-11 MED ORDER — SODIUM CHLORIDE 0.9 % IV SOLN
200.0000 mg | Freq: Once | INTRAVENOUS | Status: AC
  Administered 2021-04-11: 200 mg via INTRAVENOUS
  Filled 2021-04-11: qty 8

## 2021-04-11 MED ORDER — DIPHENHYDRAMINE HCL 50 MG/ML IJ SOLN
50.0000 mg | Freq: Once | INTRAMUSCULAR | Status: AC
  Administered 2021-04-11: 50 mg via INTRAVENOUS
  Filled 2021-04-11: qty 1

## 2021-04-11 MED ORDER — SODIUM CHLORIDE 0.9% FLUSH
10.0000 mL | INTRAVENOUS | Status: DC | PRN
  Administered 2021-04-11: 10 mL

## 2021-04-11 MED ORDER — SODIUM CHLORIDE 0.9 % IV SOLN
10.0000 mg | Freq: Once | INTRAVENOUS | Status: AC
  Administered 2021-04-11: 10 mg via INTRAVENOUS
  Filled 2021-04-11: qty 10

## 2021-04-11 MED ORDER — SODIUM CHLORIDE 0.9 % IV SOLN: Freq: Once | INTRAVENOUS | Status: AC

## 2021-04-11 MED ORDER — HEPARIN SOD (PORK) LOCK FLUSH 100 UNIT/ML IV SOLN
500.0000 [IU] | Freq: Once | INTRAVENOUS | Status: AC | PRN
  Administered 2021-04-11: 500 [IU]

## 2021-04-11 NOTE — Progress Notes (Signed)
Chaplain engaged in an initial visit with Denise [name].  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  Her supervisors worried about her becoming sick with working with numerous patients with various conditions and illnesses.  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  Denise [name] desires to keep going and has found a school system that works great with her treatment schedule.  She is able to be off on Thursdays to accommodate her medical needs.  Denise [name] shared her excitement over the new endeavor as well as her anxiety and fear.  Chaplain offered listening, presence and support on Denise [name]'s new journey.  Chaplain also offered prayer over Denise [name] for her new exciting role.  Chaplain worked to Tax inspector experience, knowledge and the divinity of a position that works with her needs.

## 2021-04-11 NOTE — Progress Notes (Signed)
WBC's  9.9 okay for treatment today per Dr.Katragadda.

## 2021-04-11 NOTE — Patient Instructions (Signed)
[facility]  Discharge Instructions: Thank you for choosing [facility] to provide your oncology and hematology care.  If you have a lab appointment with the Cancer Center, please come in thru the Main Entrance and check in at the main information desk.  Wear comfortable clothing and clothing appropriate for easy access to any Portacath or PICC line.   We strive to give you quality time with your provider. You may need to reschedule your appointment if you arrive late (15 or more minutes).  Arriving late affects you and other patients whose appointments are after yours.  Also, if you miss three or more appointments without notifying the office, you may be dismissed from the clinic at the provider's discretion.      For prescription refill requests, have your pharmacy contact our office and allow 72 hours for refills to be completed.        To help prevent nausea and vomiting after your treatment, we encourage you to take your nausea medication as directed.  BELOW ARE SYMPTOMS THAT SHOULD BE REPORTED IMMEDIATELY: *FEVER GREATER THAN 100.4 F (38 C) OR HIGHER *CHILLS OR SWEATING *NAUSEA AND VOMITING THAT IS NOT CONTROLLED WITH YOUR NAUSEA MEDICATION *UNUSUAL SHORTNESS OF BREATH *UNUSUAL BRUISING OR BLEEDING *URINARY PROBLEMS (pain or burning when urinating, or frequent urination) *BOWEL PROBLEMS (unusual diarrhea, constipation, pain near the anus) TENDERNESS IN MOUTH AND THROAT WITH OR WITHOUT PRESENCE OF ULCERS (sore throat, sores in mouth, or a toothache) UNUSUAL RASH, SWELLING OR PAIN  UNUSUAL VAGINAL DISCHARGE OR ITCHING   Items with * indicate a potential emergency and should be followed up as soon as possible or go to the Emergency Department if any problems should occur.  Please show the CHEMOTHERAPY ALERT CARD or IMMUNOTHERAPY ALERT CARD at check-in to the Emergency Department and triage nurse.  Should you have questions after your visit or need to cancel  or reschedule your appointment, please contact [facility] [phone]  and follow the prompts.  Office hours are 8:00 a.m. to 4:30 p.m. Monday - Friday. Please note that voicemails left after 4:00 p.m. may not be returned until the following business day.  We are closed weekends and major holidays. You have access to a nurse at all times for urgent questions. Please call the main number to the clinic [phone] and follow the prompts.  For any non-urgent questions, you may also contact your provider using MyChart. We now offer e-Visits for anyone 18 and older to request care online for non-urgent symptoms. For details visit [email or web address].   Also download the MyChart app! Go to the app store, search "MyChart", open the app, select [facility], and log in with your MyChart username and password.  Due to Covid, a mask is required upon entering the hospital/clinic. If you do not have a mask, one will be given to you upon arrival. For doctor visits, patients may have 1 support person aged 18 or older with them. For treatment visits, patients cannot have anyone with them due to current Covid guidelines and our immunocompromised population.  

## 2021-04-11 NOTE — Progress Notes (Signed)
Treatment given per orders. Patient tolerated it well without problems. Vitals stable and discharged home from clinic ambulatory. Follow up as scheduled.  

## 2021-04-11 NOTE — Progress Notes (Signed)
OK to proceed with treatment today if ANC 1.2 or higher.  V.O Dr Sula Soda, PharmD

## 2021-04-16 ENCOUNTER — Other Ambulatory Visit: Payer: Self-pay

## 2021-04-16 ENCOUNTER — Inpatient Hospital Stay (HOSPITAL_COMMUNITY): Payer: Medicare Other | Attending: Hematology

## 2021-04-16 ENCOUNTER — Inpatient Hospital Stay (HOSPITAL_COMMUNITY): Payer: Medicare Other

## 2021-04-16 VITALS — BP 113/67 | HR 79 | Temp 97.2°F | Resp 18

## 2021-04-16 DIAGNOSIS — J45909 Unspecified asthma, uncomplicated: Secondary | ICD-10-CM | POA: Diagnosis not present

## 2021-04-16 DIAGNOSIS — Z5111 Encounter for antineoplastic chemotherapy: Secondary | ICD-10-CM | POA: Diagnosis not present

## 2021-04-16 DIAGNOSIS — C50912 Malignant neoplasm of unspecified site of left female breast: Secondary | ICD-10-CM | POA: Insufficient documentation

## 2021-04-16 DIAGNOSIS — Z803 Family history of malignant neoplasm of breast: Secondary | ICD-10-CM | POA: Insufficient documentation

## 2021-04-16 DIAGNOSIS — Z801 Family history of malignant neoplasm of trachea, bronchus and lung: Secondary | ICD-10-CM | POA: Insufficient documentation

## 2021-04-16 DIAGNOSIS — E039 Hypothyroidism, unspecified: Secondary | ICD-10-CM | POA: Diagnosis not present

## 2021-04-16 DIAGNOSIS — I10 Essential (primary) hypertension: Secondary | ICD-10-CM | POA: Insufficient documentation

## 2021-04-16 DIAGNOSIS — Z79899 Other long term (current) drug therapy: Secondary | ICD-10-CM | POA: Diagnosis not present

## 2021-04-16 DIAGNOSIS — D709 Neutropenia, unspecified: Secondary | ICD-10-CM | POA: Insufficient documentation

## 2021-04-16 DIAGNOSIS — Z809 Family history of malignant neoplasm, unspecified: Secondary | ICD-10-CM | POA: Insufficient documentation

## 2021-04-16 DIAGNOSIS — R110 Nausea: Secondary | ICD-10-CM | POA: Diagnosis not present

## 2021-04-16 DIAGNOSIS — D702 Other drug-induced agranulocytosis: Secondary | ICD-10-CM

## 2021-04-16 DIAGNOSIS — G620 Drug-induced polyneuropathy: Secondary | ICD-10-CM | POA: Diagnosis not present

## 2021-04-16 DIAGNOSIS — Z171 Estrogen receptor negative status [ER-]: Secondary | ICD-10-CM | POA: Insufficient documentation

## 2021-04-16 LAB — TSH: TSH: 1.721 u[IU]/mL (ref 0.350–4.500)

## 2021-04-16 LAB — COMPREHENSIVE METABOLIC PANEL
ALT: 24 U/L (ref 0–44)
AST: 23 U/L (ref 15–41)
Albumin: 3.7 g/dL (ref 3.5–5.0)
Alkaline Phosphatase: 57 U/L (ref 38–126)
Anion gap: 6 (ref 5–15)
BUN: 20 mg/dL (ref 8–23)
CO2: 24 mmol/L (ref 22–32)
Calcium: 8.5 mg/dL — ABNORMAL LOW (ref 8.9–10.3)
Chloride: 108 mmol/L (ref 98–111)
Creatinine, Ser: 0.7 mg/dL (ref 0.44–1.00)
GFR, Estimated: >60 mL/min (ref >60)
Glucose, Bld: 115 mg/dL — ABNORMAL HIGH (ref 70–99)
Potassium: 4.2 mmol/L (ref 3.5–5.1)
Sodium: 138 mmol/L (ref 135–145)
Total Bilirubin: 0.5 mg/dL (ref 0.3–1.2)
Total Protein: 6.8 g/dL (ref 6.5–8.1)

## 2021-04-16 LAB — CBC WITH DIFFERENTIAL/PLATELET
Abs Immature Granulocytes: 0.01 10*3/uL (ref 0.00–0.07)
Basophils Absolute: 0 10*3/uL (ref 0.0–0.1)
Basophils Relative: 1 %
Eosinophils Absolute: 0 10*3/uL (ref 0.0–0.5)
Eosinophils Relative: 3 %
HCT: 33.5 % — ABNORMAL LOW (ref 36.0–46.0)
Hemoglobin: 10.9 g/dL — ABNORMAL LOW (ref 12.0–15.0)
Immature Granulocytes: 1 %
Lymphocytes Relative: 70 %
Lymphs Abs: 1 10*3/uL (ref 0.7–4.0)
MCH: 32.7 pg (ref 26.0–34.0)
MCHC: 32.5 g/dL (ref 30.0–36.0)
MCV: 100.6 fL — ABNORMAL HIGH (ref 80.0–100.0)
Monocytes Absolute: 0.1 10*3/uL (ref 0.1–1.0)
Monocytes Relative: 6 %
Neutro Abs: 0.3 10*3/uL — CL (ref 1.7–7.7)
Neutrophils Relative %: 19 %
Platelets: 206 10*3/uL (ref 150–400)
RBC: 3.33 MIL/uL — ABNORMAL LOW (ref 3.87–5.11)
RDW: 13 % (ref 11.5–15.5)
WBC: 1.4 10*3/uL — CL (ref 4.0–10.5)
nRBC: 0 % (ref 0.0–0.2)

## 2021-04-16 LAB — MAGNESIUM: Magnesium: 1.9 mg/dL (ref 1.7–2.4)

## 2021-04-16 MED ORDER — FILGRASTIM-SNDZ 480 MCG/0.8ML IJ SOSY
480.0000 ug | PREFILLED_SYRINGE | Freq: Once | INTRAMUSCULAR | Status: AC
  Administered 2021-04-16: 480 ug via SUBCUTANEOUS
  Filled 2021-04-16: qty 0.8

## 2021-04-16 MED ORDER — FAMOTIDINE 20 MG PO TABS: 20.0000 mg | ORAL_TABLET | Freq: Every day | ORAL | 0 refills | Status: DC

## 2021-04-16 NOTE — Progress Notes (Signed)
Denise [name] 300 today. Will give injection per MD order.   Patient also complaining of severe indigestion since her last treatment. Will notify MD and per patient , she will just find out tomorrow what to take.   Patient tolerated it well without problems. Vitals stable and discharged home from clinic ambulatory. Follow up as scheduled.   MD notified, Pepcid called into pharmacy and Maalox OTC for intermediate relief. Will let pt know at appointment for treatment tomorrow.

## 2021-04-16 NOTE — Progress Notes (Unsigned)
CRITICAL VALUE ALERT Critical value received:  WBC 1.4. ANC 0.3 Date of notification:  04/16/21 Time of notification: 14:28 pm Critical value read back:  Yes.   Nurse who received alert:  Bpresnell RN/ Critical received from [facility] lab tech.  MD notified time and response:  Dr. Delton Coombes by secure chat.

## 2021-04-17 ENCOUNTER — Ambulatory Visit (HOSPITAL_COMMUNITY): Payer: Medicare Other | Admitting: Hematology

## 2021-04-17 ENCOUNTER — Inpatient Hospital Stay (HOSPITAL_COMMUNITY): Payer: Medicare Other

## 2021-04-17 ENCOUNTER — Encounter (HOSPITAL_COMMUNITY): Payer: Self-pay

## 2021-04-17 ENCOUNTER — Other Ambulatory Visit (HOSPITAL_COMMUNITY): Payer: Medicare Other

## 2021-04-17 VITALS — BP 120/72 | HR 80 | Temp 96.8°F | Resp 18

## 2021-04-17 DIAGNOSIS — E039 Hypothyroidism, unspecified: Secondary | ICD-10-CM | POA: Diagnosis not present

## 2021-04-17 DIAGNOSIS — D702 Other drug-induced agranulocytosis: Secondary | ICD-10-CM

## 2021-04-17 DIAGNOSIS — C50912 Malignant neoplasm of unspecified site of left female breast: Secondary | ICD-10-CM

## 2021-04-17 DIAGNOSIS — Z171 Estrogen receptor negative status [ER-]: Secondary | ICD-10-CM | POA: Diagnosis not present

## 2021-04-17 DIAGNOSIS — G620 Drug-induced polyneuropathy: Secondary | ICD-10-CM | POA: Diagnosis not present

## 2021-04-17 DIAGNOSIS — Z5111 Encounter for antineoplastic chemotherapy: Secondary | ICD-10-CM | POA: Diagnosis not present

## 2021-04-17 DIAGNOSIS — D709 Neutropenia, unspecified: Secondary | ICD-10-CM | POA: Diagnosis not present

## 2021-04-17 DIAGNOSIS — Z95828 Presence of other vascular implants and grafts: Secondary | ICD-10-CM

## 2021-04-17 LAB — CBC WITH DIFFERENTIAL/PLATELET
Abs Immature Granulocytes: 0.28 10*3/uL — ABNORMAL HIGH (ref 0.00–0.07)
Basophils Absolute: 0 10*3/uL (ref 0.0–0.1)
Basophils Relative: 1 %
Eosinophils Absolute: 0 10*3/uL (ref 0.0–0.5)
Eosinophils Relative: 2 %
HCT: 31.8 % — ABNORMAL LOW (ref 36.0–46.0)
Hemoglobin: 10.4 g/dL — ABNORMAL LOW (ref 12.0–15.0)
Immature Granulocytes: 14 %
Lymphocytes Relative: 47 %
Lymphs Abs: 1 10*3/uL (ref 0.7–4.0)
MCH: 33 pg (ref 26.0–34.0)
MCHC: 32.7 g/dL (ref 30.0–36.0)
MCV: 101 fL — ABNORMAL HIGH (ref 80.0–100.0)
Monocytes Absolute: 0.1 10*3/uL (ref 0.1–1.0)
Monocytes Relative: 5 %
Neutro Abs: 0.6 10*3/uL — ABNORMAL LOW (ref 1.7–7.7)
Neutrophils Relative %: 31 %
Platelets: 187 10*3/uL (ref 150–400)
RBC: 3.15 MIL/uL — ABNORMAL LOW (ref 3.87–5.11)
RDW: 13 % (ref 11.5–15.5)
Smear Review: ADEQUATE
WBC: 2 10*3/uL — ABNORMAL LOW (ref 4.0–10.5)
nRBC: 0 % (ref 0.0–0.2)

## 2021-04-17 MED ORDER — SODIUM CHLORIDE 0.9% FLUSH
10.0000 mL | INTRAVENOUS | Status: DC | PRN
  Administered 2021-04-17: 10 mL via INTRAVENOUS

## 2021-04-17 MED ORDER — FILGRASTIM-SNDZ 480 MCG/0.8ML IJ SOSY
480.0000 ug | PREFILLED_SYRINGE | Freq: Once | INTRAMUSCULAR | Status: AC
  Administered 2021-04-17: 480 ug via SUBCUTANEOUS
  Filled 2021-04-17: qty 0.8

## 2021-04-17 MED ORDER — HEPARIN SOD (PORK) LOCK FLUSH 100 UNIT/ML IV SOLN
500.0000 [IU] | Freq: Once | INTRAVENOUS | Status: AC
  Administered 2021-04-17: 500 [IU] via INTRAVENOUS

## 2021-04-17 NOTE — Progress Notes (Signed)
Patient tolerated treatment well with no complaints voiced.  Patient left ambulatory in stable condition.  Vital signs stable at discharge.  Follow up as scheduled.    

## 2021-04-17 NOTE — Patient Instructions (Signed)
[facility]  Discharge Instructions: Thank you for choosing [facility] to provide your oncology and hematology care.  If you have a lab appointment with the Cancer Center, please come in thru the Main Entrance and check in at the main information desk.  Wear comfortable clothing and clothing appropriate for easy access to any Portacath or PICC line.   We strive to give you quality time with your provider. You may need to reschedule your appointment if you arrive late (15 or more minutes).  Arriving late affects you and other patients whose appointments are after yours.  Also, if you miss three or more appointments without notifying the office, you may be dismissed from the clinic at the provider's discretion.      For prescription refill requests, have your pharmacy contact our office and allow 72 hours for refills to be completed.        To help prevent nausea and vomiting after your treatment, we encourage you to take your nausea medication as directed.  BELOW ARE SYMPTOMS THAT SHOULD BE REPORTED IMMEDIATELY: *FEVER GREATER THAN 100.4 F (38 C) OR HIGHER *CHILLS OR SWEATING *NAUSEA AND VOMITING THAT IS NOT CONTROLLED WITH YOUR NAUSEA MEDICATION *UNUSUAL SHORTNESS OF BREATH *UNUSUAL BRUISING OR BLEEDING *URINARY PROBLEMS (pain or burning when urinating, or frequent urination) *BOWEL PROBLEMS (unusual diarrhea, constipation, pain near the anus) TENDERNESS IN MOUTH AND THROAT WITH OR WITHOUT PRESENCE OF ULCERS (sore throat, sores in mouth, or a toothache) UNUSUAL RASH, SWELLING OR PAIN  UNUSUAL VAGINAL DISCHARGE OR ITCHING   Items with * indicate a potential emergency and should be followed up as soon as possible or go to the Emergency Department if any problems should occur.  Please show the CHEMOTHERAPY ALERT CARD or IMMUNOTHERAPY ALERT CARD at check-in to the Emergency Department and triage nurse.  Should you have questions after your visit or need to cancel  or reschedule your appointment, please contact [facility] [phone]  and follow the prompts.  Office hours are 8:00 a.m. to 4:30 p.m. Monday - Friday. Please note that voicemails left after 4:00 p.m. may not be returned until the following business day.  We are closed weekends and major holidays. You have access to a nurse at all times for urgent questions. Please call the main number to the clinic [phone] and follow the prompts.  For any non-urgent questions, you may also contact your provider using MyChart. We now offer e-Visits for anyone 18 and older to request care online for non-urgent symptoms. For details visit [email or web address].   Also download the MyChart app! Go to the app store, search "MyChart", open the app, select [facility], and log in with your MyChart username and password.  Due to Covid, a mask is required upon entering the hospital/clinic. If you do not have a mask, one will be given to you upon arrival. For doctor visits, patients may have 1 support person aged 18 or older with them. For treatment visits, patients cannot have anyone with them due to current Covid guidelines and our immunocompromised population.  

## 2021-04-17 NOTE — Progress Notes (Signed)
WBC 2.0, no treatment today, will give injection Zarxio today and possible treatment tomorrow per MD.

## 2021-04-18 ENCOUNTER — Other Ambulatory Visit: Payer: Self-pay

## 2021-04-18 ENCOUNTER — Inpatient Hospital Stay (HOSPITAL_COMMUNITY): Payer: Medicare Other

## 2021-04-18 VITALS — BP 122/68 | HR 69 | Temp 96.8°F | Resp 18

## 2021-04-18 DIAGNOSIS — Z171 Estrogen receptor negative status [ER-]: Secondary | ICD-10-CM | POA: Diagnosis not present

## 2021-04-18 DIAGNOSIS — C50912 Malignant neoplasm of unspecified site of left female breast: Secondary | ICD-10-CM | POA: Diagnosis not present

## 2021-04-18 DIAGNOSIS — Z95828 Presence of other vascular implants and grafts: Secondary | ICD-10-CM

## 2021-04-18 DIAGNOSIS — D702 Other drug-induced agranulocytosis: Secondary | ICD-10-CM

## 2021-04-18 DIAGNOSIS — E039 Hypothyroidism, unspecified: Secondary | ICD-10-CM | POA: Diagnosis not present

## 2021-04-18 DIAGNOSIS — G620 Drug-induced polyneuropathy: Secondary | ICD-10-CM | POA: Diagnosis not present

## 2021-04-18 DIAGNOSIS — Z5111 Encounter for antineoplastic chemotherapy: Secondary | ICD-10-CM | POA: Diagnosis not present

## 2021-04-18 DIAGNOSIS — D709 Neutropenia, unspecified: Secondary | ICD-10-CM | POA: Diagnosis not present

## 2021-04-18 LAB — CBC WITH DIFFERENTIAL/PLATELET
Band Neutrophils: 1 %
Basophils Absolute: 0 10*3/uL (ref 0.0–0.1)
Basophils Relative: 0 %
Eosinophils Absolute: 0.1 10*3/uL (ref 0.0–0.5)
Eosinophils Relative: 8 %
HCT: 29.9 % — ABNORMAL LOW (ref 36.0–46.0)
Hemoglobin: 9.7 g/dL — ABNORMAL LOW (ref 12.0–15.0)
Lymphocytes Relative: 66 %
Lymphs Abs: 1.1 10*3/uL (ref 0.7–4.0)
MCH: 33 pg (ref 26.0–34.0)
MCHC: 32.4 g/dL (ref 30.0–36.0)
MCV: 101.7 fL — ABNORMAL HIGH (ref 80.0–100.0)
Metamyelocytes Relative: 1 %
Monocytes Absolute: 0.1 10*3/uL (ref 0.1–1.0)
Monocytes Relative: 6 %
Neutro Abs: 0.3 10*3/uL — CL (ref 1.7–7.7)
Neutrophils Relative %: 18 %
Platelets: 177 10*3/uL (ref 150–400)
RBC: 2.94 MIL/uL — ABNORMAL LOW (ref 3.87–5.11)
RDW: 13 % (ref 11.5–15.5)
WBC: 1.6 10*3/uL — ABNORMAL LOW (ref 4.0–10.5)
nRBC: 0 % (ref 0.0–0.2)

## 2021-04-18 MED ORDER — SODIUM CHLORIDE 0.9% FLUSH
10.0000 mL | INTRAVENOUS | Status: DC | PRN
  Administered 2021-04-18: 10 mL via INTRAVENOUS

## 2021-04-18 MED ORDER — FILGRASTIM-SNDZ 480 MCG/0.8ML IJ SOSY
480.0000 ug | PREFILLED_SYRINGE | Freq: Once | INTRAMUSCULAR | Status: AC
Start: 2021-04-18 — End: 2021-04-18
  Administered 2021-04-18: 480 ug via SUBCUTANEOUS
  Filled 2021-04-18: qty 0.8

## 2021-04-18 MED ORDER — HEPARIN SOD (PORK) LOCK FLUSH 100 UNIT/ML IV SOLN
500.0000 [IU] | Freq: Once | INTRAVENOUS | Status: AC
  Administered 2021-04-18: 500 [IU] via INTRAVENOUS

## 2021-04-18 NOTE — Patient Instructions (Signed)
[facility]  Discharge Instructions: Thank you for choosing [facility] to provide your oncology and hematology care.  If you have a lab appointment with the Cancer Center, please come in thru the Main Entrance and check in at the main information desk.  Wear comfortable clothing and clothing appropriate for easy access to any Portacath or PICC line.   We strive to give you quality time with your provider. You may need to reschedule your appointment if you arrive late (15 or more minutes).  Arriving late affects you and other patients whose appointments are after yours.  Also, if you miss three or more appointments without notifying the office, you may be dismissed from the clinic at the provider's discretion.      For prescription refill requests, have your pharmacy contact our office and allow 72 hours for refills to be completed.        To help prevent nausea and vomiting after your treatment, we encourage you to take your nausea medication as directed.  BELOW ARE SYMPTOMS THAT SHOULD BE REPORTED IMMEDIATELY: *FEVER GREATER THAN 100.4 F (38 C) OR HIGHER *CHILLS OR SWEATING *NAUSEA AND VOMITING THAT IS NOT CONTROLLED WITH YOUR NAUSEA MEDICATION *UNUSUAL SHORTNESS OF BREATH *UNUSUAL BRUISING OR BLEEDING *URINARY PROBLEMS (pain or burning when urinating, or frequent urination) *BOWEL PROBLEMS (unusual diarrhea, constipation, pain near the anus) TENDERNESS IN MOUTH AND THROAT WITH OR WITHOUT PRESENCE OF ULCERS (sore throat, sores in mouth, or a toothache) UNUSUAL RASH, SWELLING OR PAIN  UNUSUAL VAGINAL DISCHARGE OR ITCHING   Items with * indicate a potential emergency and should be followed up as soon as possible or go to the Emergency Department if any problems should occur.  Please show the CHEMOTHERAPY ALERT CARD or IMMUNOTHERAPY ALERT CARD at check-in to the Emergency Department and triage nurse.  Should you have questions after your visit or need to cancel  or reschedule your appointment, please contact [facility] [phone]  and follow the prompts.  Office hours are 8:00 a.m. to 4:30 p.m. Monday - Friday. Please note that voicemails left after 4:00 p.m. may not be returned until the following business day.  We are closed weekends and major holidays. You have access to a nurse at all times for urgent questions. Please call the main number to the clinic [phone] and follow the prompts.  For any non-urgent questions, you may also contact your provider using MyChart. We now offer e-Visits for anyone 18 and older to request care online for non-urgent symptoms. For details visit [email or web address].   Also download the MyChart app! Go to the app store, search "MyChart", open the app, select [facility], and log in with your MyChart username and password.  Due to Covid, a mask is required upon entering the hospital/clinic. If you do not have a mask, one will be given to you upon arrival. For doctor visits, patients may have 1 support person aged 18 or older with them. For treatment visits, patients cannot have anyone with them due to current Covid guidelines and our immunocompromised population.  

## 2021-04-18 NOTE — Progress Notes (Signed)
CRITICAL VALUE ALERT Critical value received:  ANC 0.3 Date of notification:  04/18/21 Time of notification: 0919 Critical value read back:  Yes.   Nurse who received alert:  Bpresnell RN/ Received alert from [facility] lab tech MD notified time and response:  Dr. Delton Coombes aware.

## 2021-04-18 NOTE — Progress Notes (Signed)
Patient presents today for chemotherapy treatment. WBC is 1.6 today and we will hold treatment today per Dr. Delton Coombes.  Patient will get Zarxio 480 mcg today per Dr. Delton Coombes.  Patient should return on Monday for repeat labs and possible injection/treatment.  Patient tolerated injection well with no complaints voiced.  Patient left ambulatory in stable condition.  Vital signs stable at discharge.  Follow up as scheduled.

## 2021-04-19 ENCOUNTER — Emergency Department (HOSPITAL_COMMUNITY)
Admission: EM | Admit: 2021-04-19 | Discharge: 2021-04-19 | Disposition: A | Discharge status: Home / Self Care | Payer: Medicare Other | Attending: Emergency Medicine | Admitting: Emergency Medicine

## 2021-04-19 ENCOUNTER — Encounter (HOSPITAL_COMMUNITY): Payer: Self-pay | Admitting: Emergency Medicine

## 2021-04-19 ENCOUNTER — Other Ambulatory Visit: Payer: Self-pay

## 2021-04-19 ENCOUNTER — Emergency Department (HOSPITAL_COMMUNITY): Payer: Medicare Other

## 2021-04-19 DIAGNOSIS — E039 Hypothyroidism, unspecified: Secondary | ICD-10-CM | POA: Diagnosis not present

## 2021-04-19 DIAGNOSIS — R079 Chest pain, unspecified: Secondary | ICD-10-CM

## 2021-04-19 DIAGNOSIS — Z7951 Long term (current) use of inhaled steroids: Secondary | ICD-10-CM | POA: Diagnosis not present

## 2021-04-19 DIAGNOSIS — I10 Essential (primary) hypertension: Secondary | ICD-10-CM | POA: Insufficient documentation

## 2021-04-19 DIAGNOSIS — Z853 Personal history of malignant neoplasm of breast: Secondary | ICD-10-CM | POA: Diagnosis not present

## 2021-04-19 DIAGNOSIS — R911 Solitary pulmonary nodule: Secondary | ICD-10-CM | POA: Diagnosis not present

## 2021-04-19 DIAGNOSIS — J45909 Unspecified asthma, uncomplicated: Secondary | ICD-10-CM | POA: Insufficient documentation

## 2021-04-19 DIAGNOSIS — R0789 Other chest pain: Secondary | ICD-10-CM | POA: Insufficient documentation

## 2021-04-19 DIAGNOSIS — Z79899 Other long term (current) drug therapy: Secondary | ICD-10-CM | POA: Diagnosis not present

## 2021-04-19 LAB — CBC
HCT: 30.9 % — ABNORMAL LOW (ref 36.0–46.0)
Hemoglobin: 10.4 g/dL — ABNORMAL LOW (ref 12.0–15.0)
MCH: 34 pg (ref 26.0–34.0)
MCHC: 33.7 g/dL (ref 30.0–36.0)
MCV: 101 fL — ABNORMAL HIGH (ref 80.0–100.0)
Platelets: 173 10*3/uL (ref 150–400)
RBC: 3.06 MIL/uL — ABNORMAL LOW (ref 3.87–5.11)
RDW: 13.3 % (ref 11.5–15.5)
WBC: 2.4 10*3/uL — ABNORMAL LOW (ref 4.0–10.5)
nRBC: 0 % (ref 0.0–0.2)

## 2021-04-19 LAB — BASIC METABOLIC PANEL
Anion gap: 4 — ABNORMAL LOW (ref 5–15)
BUN: 12 mg/dL (ref 8–23)
CO2: 26 mmol/L (ref 22–32)
Calcium: 8.9 mg/dL (ref 8.9–10.3)
Chloride: 109 mmol/L (ref 98–111)
Creatinine, Ser: 0.78 mg/dL (ref 0.44–1.00)
GFR, Estimated: >60 mL/min (ref >60)
Glucose, Bld: 85 mg/dL (ref 70–99)
Potassium: 4.2 mmol/L (ref 3.5–5.1)
Sodium: 139 mmol/L (ref 135–145)

## 2021-04-19 LAB — TROPONIN I (HIGH SENSITIVITY)
Troponin I (High Sensitivity): 3 ng/L (ref <18)
Troponin I (High Sensitivity): 2 ng/L (ref <18)

## 2021-04-19 LAB — D-DIMER, QUANTITATIVE: D-Dimer, Quant: 0.44 ug/mL-FEU (ref 0.00–0.50)

## 2021-04-19 IMAGING — DX DG CHEST 1V PORT
1 series · 1 of 1 positions shown · non-contrast
Comparison: Chest x-ray [DATE].

CLINICAL DATA: 69-year-old female with history of chest pain.

EXAM:
PORTABLE CHEST 1 VIEW

[chest ap]
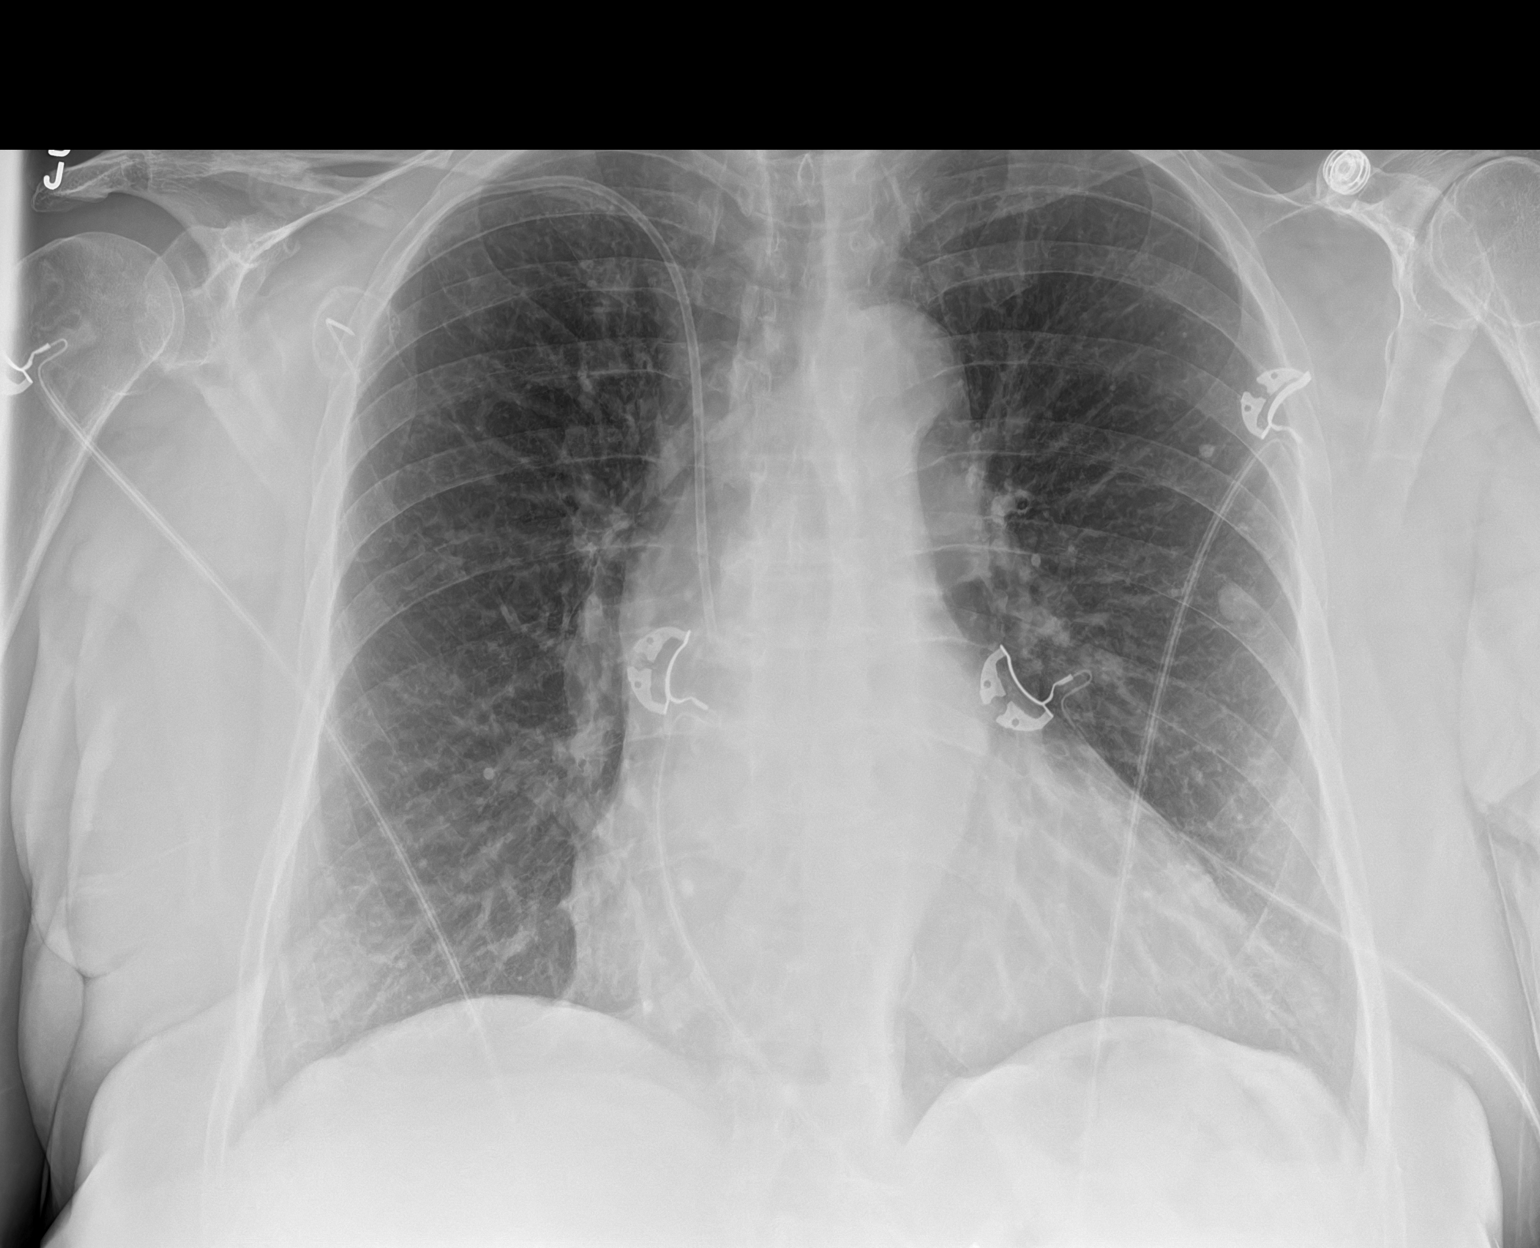

[1 of 1 positions shown; findings below may reference images not displayed]

FINDINGS: Right subclavian single-lumen porta cath with tip terminating in the
distal superior vena cava. Small calcified granuloma in the left
upper lobe. 1.1 cm nodule projecting over the left mid lung
corresponding to known left lower lobe pulmonary nodule, similar to
prior studies. No acute consolidative airspace disease. No pleural
effusions. No pneumothorax. No evidence of pulmonary edema. Heart
size is borderline enlarged. Upper mediastinal contours are within
normal limits. Aortic atherosclerosis.
IMPRESSION: 1. No radiographic evidence of acute cardiopulmonary disease.
2. Stable left lower lobe pulmonary nodule.
3. Aortic atherosclerosis.

## 2021-04-19 MED ORDER — HEPARIN SOD (PORK) LOCK FLUSH 100 UNIT/ML IV SOLN
500.0000 [IU] | Freq: Once | INTRAVENOUS | Status: AC
  Administered 2021-04-19: 500 [IU]

## 2021-04-19 MED ORDER — NITROGLYCERIN 0.4 MG SL SUBL
0.4000 mg | SUBLINGUAL_TABLET | SUBLINGUAL | Status: DC | PRN
  Administered 2021-04-19 (×2): 0.4 mg via SUBLINGUAL
  Filled 2021-04-19: qty 1

## 2021-04-19 MED ORDER — ASPIRIN 81 MG PO CHEW
324.0000 mg | CHEWABLE_TABLET | Freq: Once | ORAL | Status: AC
  Administered 2021-04-19: 324 mg via ORAL
  Filled 2021-04-19: qty 4

## 2021-04-19 NOTE — ED Provider Notes (Signed)
This patient was accepted at change of shift, pending second troponin and D-dimer.  Thankfully these tests are unremarkable and show no signs of pulmonary embolism, she is not tachycardic hypoxic and her pain is been going on since 9:00 last night so after 12 hours of ongoing pain with an undetectable troponin I feel that she is extremely low risk for coronary disease especially with an unremarkable EKG.  She still has a mild discomfort in her chest but it is improved significantly, at this time she is stable for discharge, I doubt a pathological cause to her symptoms however I have had a good discussion with the patient regarding the reasons for return and she is in complete agreement and agreeable to the plan   Denise Chapel, MD 04/19/21 [phone]

## 2021-04-19 NOTE — Discharge Instructions (Addendum)
Your testing was reassuring and showed no signs of heart attack.  I would recommend that you follow-up closely with your oncologist as well as your family doctor.  However if you should develop increasing or worsening pain in the chest or difficulty breathing nausea, vomiting, sweating unusually or other concerning symptoms return to the emergency department immediately

## 2021-04-19 NOTE — Progress Notes (Signed)
Denise [name], [place] [address]   CLINIC:  Medical Oncology/Hematology  PCP:  Neale Burly, MD [place] / [place] [id number] [phone]   REASON FOR VISIT:  Follow-up for left breast cancer  PRIOR THERAPY: none  NGS Results: not done  CURRENT THERAPY:  weekly carboplatin and paclitaxel along with pembrolizumab  BRIEF ONCOLOGIC HISTORY:  Oncology History  Invasive lobular carcinoma of left breast in female Denise [name])  01/23/2021 Initial Diagnosis   Invasive lobular carcinoma of left breast in female Denise [name])   02/19/2021 Genetic Testing   Negative genetic testing on the CancerNext-Expanded+RNAinsight panel.  SMARCB1 VUS identified.  The CancerNext-Expanded gene panel offered by [name] and includes sequencing and rearrangement analysis for the following 77 genes: AIP, ALK, APC*, ATM*, AXIN2, BAP1, BARD1, BLM, BMPR1A, BRCA1*, BRCA2*, BRIP1*, CDC73, CDH1*, CDK4, CDKN1B, CDKN2A, CHEK2*, CTNNA1, DICER1, FANCC, FH, FLCN, GALNT12, KIF1B, LZTR1, MAX, MEN1, MET, MLH1*, MSH2*, MSH3, MSH6*, MUTYH*, NBN, NF1*, NF2, NTHL1, PALB2*, PHOX2B, PMS2*, POT1, PRKAR1A, PTCH1, PTEN*, RAD51C*, RAD51D*, RB1, RECQL, RET, SDHA, SDHAF2, SDHB, SDHC, SDHD, SMAD4, SMARCA4, SMARCB1, SMARCE1, STK11, SUFU, TMEM127, TP53*, TSC1, TSC2, VHL and XRCC2 (sequencing and deletion/duplication); EGFR, EGLN1, HOXB13, KIT, MITF, PDGFRA, POLD1, and POLE (sequencing only); EPCAM and GREM1 (deletion/duplication only). DNA and RNA analyses performed for * genes. The report date is February 19, 2021.   02/27/2021 -  Chemotherapy    Patient is on Treatment Plan: BREAST PEMBROLIZUMAB + CARBOPLATIN D1,8,15+ PACLITAXEL D1,8,15 Q21D X 4 CYCLES / PEMBROLIZUMAB + AC Q21D X 4 CYCLES         CANCER STAGING: Cancer Staging Invasive lobular carcinoma of left breast in female Denise [name]) Staging form: Breast, AJCC 8th Edition - Clinical stage from 01/23/2021: cT3, cN3c, G2, ER-, PR-, HER2- -  Unsigned   INTERVAL HISTORY:  [name], a 70 y.o. female, returns for routine follow-up and consideration for next cycle of chemotherapy. Denise [name] was last seen on 04/10/21.  Due for day #8 cycle #2 of carboplatin and taxol today.   Overall, she tells me she has been feeling pretty well. She reported to the ED on 08/06 for constant pressure on her sternum that went through to her back, spasming CP, accompanied by leg pains and weakness starting the night of 08/05. This was relieved in the ED with Ntg x2; she has felt some soreness since, but overall denies any recurrence of this pain. She denies n/v/d, tingling or numbness, ankle swellings. She is not currently taking any magnesium.   Overall, she feels ready for next cycle of chemo today.   REVIEW OF SYSTEMS:  Review of Systems  Constitutional:  Negative for appetite change and fatigue.  Cardiovascular:  Positive for chest pain (resolved). Negative for leg swelling.  Gastrointestinal:  Negative for diarrhea, nausea and vomiting.  Neurological:  Positive for extremity weakness (leg weakness resolved). Negative for numbness.  All other systems reviewed and are negative.  PAST MEDICAL/SURGICAL HISTORY:  Past Medical History:  Diagnosis Date   Asthma    as child   Cancer (Denise [name]) 12/2020   left breast IMC   Complication of anesthesia    pateitn states,' I coded when I had my D&Cmany years ago.   Family history of breast cancer    Hypertension    Hypothyroidism    PONV (postoperative nausea and vomiting)    Port-A-Cath in place 02/26/2021   Past Surgical History:  Procedure Laterality Date   ABDOMINAL HYSTERECTOMY  BIOPSY  01/17/2018   Procedure: BIOPSY;  Surgeon: Rogene Houston, MD;  [facility];  Service: Endoscopy;;  duodenum,gastric   CHOLECYSTECTOMY     COLONOSCOPY WITH PROPOFOL N/A 01/17/2018   Procedure: COLONOSCOPY WITH PROPOFOL;  Surgeon: Rogene Houston, MD;  Location: [facility];  Service:  Endoscopy;  Laterality: N/A;  7:30   DILATION AND CURETTAGE OF UTERUS     ESOPHAGOGASTRODUODENOSCOPY (EGD) WITH PROPOFOL N/A 01/17/2018   Procedure: ESOPHAGOGASTRODUODENOSCOPY (EGD) WITH PROPOFOL;  Surgeon: Rogene Houston, MD;  [facility];  Service: Endoscopy;  Laterality: N/A;   POLYPECTOMY  01/17/2018   Procedure: POLYPECTOMY;  Surgeon: Rogene Houston, MD;  [facility];  Service: Endoscopy;;  transverse colon, cecal   PORTACATH PLACEMENT Right 02/17/2021   Procedure: INSERTION PORT-A-CATH;  Surgeon: Coralie Keens, MD;  Location: [facility];  Service: General;  Laterality: Right;    SOCIAL HISTORY:  Social History   Socioeconomic History   Marital status: Widowed    Spouse name: Not on file   Number of children: 3   Years of education: Not on file   Highest education level: Not on file  Occupational History   Occupation: [name]   Occupation: retired  Tobacco Use   Smoking status: Never   Smokeless tobacco: Never  Scientific laboratory technician Use: Never used  Substance and Sexual Activity   Alcohol use: Never   Drug use: Never   Sexual activity: Not Currently    Birth control/protection: Surgical  Other Topics Concern   Not on file  Social History Narrative   Not on file   Social Determinants of Health   Financial Resource Strain: Low Risk    Difficulty of Paying Living Expenses: Not hard at all  Food Insecurity: No Food Insecurity   Worried About Charity fundraiser in the Last Year: Never true   [place] in the Last Year: Never true  Transportation Needs: No Transportation Needs   Lack of Transportation (Medical): No   Lack of Transportation (Non-Medical): No  Physical Activity: Insufficiently Active   Days of Exercise per Week: 3 days   Minutes of Exercise per Session: 30 min  Stress: No Stress Concern Present   Feeling of Stress : Not at all  Social Connections: Moderately Integrated   Frequency of  Communication with Friends and Family: More than three times a week   Frequency of Social Gatherings with Friends and Family: More than three times a week   Attends Religious Services: More than 4 times per year   Active Member of Clubs or Organizations: No   Attends Music therapist: More than 4 times per year   Marital Status: Widowed  Human resources officer Violence: Not At Risk   Fear of Current or Ex-Partner: No   Emotionally Abused: No   Physically Abused: No   Sexually Abused: No    FAMILY HISTORY:  Family History  Problem Relation Age of Onset   Breast cancer Mother        dx in her 61s   Heart disease Mother    Thyroid disease Mother    Lung cancer Father        dx in his 16s   Thyroid disease Maternal Aunt    Thyroid nodules Maternal Grandmother 35       goiter   Thyroid disease Maternal Grandmother    Heart disease Maternal Grandfather  Heart disease Paternal Grandmother    Heart disease Paternal Grandfather    Thyroid disease Daughter    Thyroid disease Daughter    Cancer Maternal Uncle        NOS   Cancer Paternal Uncle        NOS   Breast cancer Cousin        pat first cousin died in her 54s;     CURRENT MEDICATIONS:  Current Outpatient Medications  Medication Sig Dispense Refill   Acetaminophen (TYLENOL PO) Take 1 tablet by mouth as needed.     albuterol (VENTOLIN HFA) 108 (90 Base) MCG/ACT inhaler Inhale into the lungs.     benazepril (LOTENSIN) 20 MG tablet Take 20 mg by mouth daily.     CARBOPLATIN IV Inject into the vein once a week.     doxepin (SINEQUAN) 10 MG capsule Take 10 mg by mouth at bedtime.     famotidine (PEPCID) 20 MG tablet Take 1 tablet (20 mg total) by mouth daily. 30 tablet 0   gabapentin (NEURONTIN) 300 MG capsule Take 1 capsule by mouth at bedtime.     hydrochlorothiazide (MICROZIDE) 12.5 MG capsule Take 12.5 mg by mouth daily.     levothyroxine (SYNTHROID) 75 MCG tablet Take 100 mcg by mouth daily before breakfast.      lidocaine-prilocaine (EMLA) cream Apply a small amount to port a cath site and cover with plastic wrap 1 hour prior to infusion appointments 30 g 3   lisinopril (PRINIVIL,ZESTRIL) 40 MG tablet Take 40 mg by mouth daily.     Melatonin 5 MG TABS Take 5 mg by mouth. As needed for sleep     Omega-3 Fatty Acids (FISH OIL PO) Take by mouth.     PACLITAXEL IV Inject into the vein once a week.     Pembrolizumab (KEYTRUDA IV) Inject into the vein every 21 ( twenty-one) days.     prochlorperazine (COMPAZINE) 10 MG tablet Take 1 tablet (10 mg total) by mouth every 6 (six) hours as needed (Nausea or vomiting). 30 tablet 1   psyllium (METAMUCIL) 58.6 % packet Take by mouth.     triamcinolone cream (KENALOG) 0.1 % SMARTSIG:1 Application Topical 2-3 Times Daily     No current facility-administered medications for this visit.    ALLERGIES:  Allergies  Allergen Reactions   Exforge [Amlodipine Besylate-Valsartan] Nausea And Vomiting   Cheese    Strawberry Extract Diarrhea    Seeds,nuts, lettuce, grapes   Tramadol Hcl Nausea And Vomiting   Yeast-Related Products Hives    bread    PHYSICAL EXAM:  Performance status (ECOG): 1 - Symptomatic but completely ambulatory  There were no vitals filed for this visit. Wt Readings from Last 3 Encounters:  04/19/21 178 lb 9.2 oz (81 kg)  04/10/21 179 lb (81.2 kg)  04/03/21 179 lb 3.2 oz (81.3 kg)   Physical Exam Vitals reviewed.  Constitutional:      Appearance: Normal appearance.  Cardiovascular:     Rate and Rhythm: Normal rate and regular rhythm.     Pulses: Normal pulses.     Heart sounds: Normal heart sounds.  Pulmonary:     Effort: Pulmonary effort is normal.     Breath sounds: Normal breath sounds.  Chest:     Chest wall: No tenderness.  Musculoskeletal:     Right lower leg: No edema.     Left lower leg: No edema.  Neurological:     General: No focal deficit present.  Mental Status: She is alert and oriented to person, place, and time.   Psychiatric:        Mood and Affect: Mood normal.        Behavior: Behavior normal.    LABORATORY DATA:  I have reviewed the labs as listed.  CBC Latest Ref Rng & Units 04/19/2021 04/18/2021 04/17/2021  WBC 4.0 - 10.5 K/uL 2.4(L) 1.6(L) 2.0(L)  Hemoglobin 12.0 - 15.0 g/dL 10.4(L) 9.7(L) 10.4(L)  Hematocrit 36.0 - 46.0 % 30.9(L) 29.9(L) 31.8(L)  Platelets 150 - 400 K/uL 173 177 187   CMP Latest Ref Rng & Units 04/19/2021 04/16/2021 04/10/2021  Glucose 70 - 99 mg/dL 85 115(H) 100(H)  BUN 8 - 23 mg/dL _0 Creatinine 0.44 - 1.00 mg/dL 0.78 0.70 0.71  Sodium 135 - 145 mmol/L 139 138 137  Potassium 3.5 - 5.1 mmol/L 4.2 4.2 3.8  Chloride 98 - 111 mmol/L 109 108 106  CO2 22 - 32 mmol/L _1 Calcium 8.9 - 10.3 mg/dL 8.9 8.5(L) 8.7(L)  Total Protein 6.5 - 8.1 g/dL - 6.8 6.4(L)  Total Bilirubin 0.3 - 1.2 mg/dL - 0.5 0.7  Alkaline Phos 38 - 126 U/L - 57 53  AST 15 - 41 U/L - 23 17  ALT 0 - 44 U/L - 24 14    DIAGNOSTIC IMAGING:  I have independently reviewed the scans and discussed with the patient. NM PET Image Restage (PS) Skull Base to Thigh  Result Date: 04/06/2021 CLINICAL DATA:  Subsequent treatment strategy for breast cancer. Ongoing chemotherapy. EXAM: NUCLEAR MEDICINE PET SKULL BASE TO THIGH TECHNIQUE: 8.9 mCi F-18 FDG was injected intravenously. Full-ring PET imaging was performed from the skull base to thigh after the radiotracer. CT data was obtained and used for attenuation correction and anatomic localization. Fasting blood glucose: 98 mg/dl COMPARISON:  02/03/2021, and CT abdomen from 03/02/2021 FINDINGS: Mediastinal blood pool activity: SUV max 2.7 Liver activity: SUV max NA NECK: No significant abnormal hypermetabolic activity in this region. Incidental CT findings: Right mastoid effusion. CHEST: Index left axillary lymph node 1.3 cm in short axis on image 59 series 4 (formerly 1.8 cm), maximum SUV 4.5 (formerly 5.9). Multiple other mildly hypermetabolic left axillary and  subpectoral lymph nodes are again observed, mildly reduced in size and activity compared to previous. Left lateral breast glandular tissues with maximum SUV 3.4 (formerly 4.0) as compared to contralateral side glandular tissues with maximum SUV of 3.2. Left lower lobe nodule measuring 1.0 by 1.0 cm on image 27 series 8 is stable, maximum SUV 0.8 (formerly 0.9). 5 by 4 mm right upper lobe nodule on image 20 of series 8 demonstrates no significant activity but is below sensitive PET-CT size thresholds. Incidental CT findings: Stable tree-in-bud reticulonodular opacities posteriorly in the left lower lobe on image 37 of series 8 characteristic of atypical infectious bronchiolitis. Calcified granuloma in the left upper lobe. Ascending thoracic aortic aneurysm 4.1 cm in diameter, stable by my measurements. ABDOMEN/PELVIS: No significant abnormal hypermetabolic activity in this region. Incidental CT findings: Sigmoid colon diverticulosis. Previous colon wall thickening is no longer present. Sharply defined 2.9 by 2.2 cm subcutaneous cystic lesion along the left abdomen on image 105 of series 4 is not hypermetabolic and is likely a sebaceous cyst or similar benign lesion. SKELETON: No significant abnormal hypermetabolic activity in this region. Incidental CT findings: none IMPRESSION: 1. Interval improvement with reduced size and activity of previous left thoracic adenopathy, and mild reduction in activity of the left  lateral breast glandular tissues. 2. 1.0 cm left lower lobe nodule remains low in activity. Surveillance may be warranted. 3. Ascending thoracic aortic aneurysm 4.1 cm in diameter. Recommend annual imaging followup by CTA or MRA. This recommendation follows 2010 ACCF/AHA/AATS/ACR/ASA/SCA/SCAI/SIR/STS/SVM Guidelines for the Diagnosis and Management of Patients with Thoracic Aortic Disease. Circulation. 2010; 121: D176-H607. Aortic aneurysm NOS (ICD10-I71.9) 4. Other imaging findings of potential clinical  significance: Right mastoid effusion. Atypical infectious bronchiolitis in the left lower lobe, unchanged. Old granulomatous disease. Sigmoid colon diverticulosis. Electronically Signed   By: Van Clines M.D.   On: 04/06/2021 11:05   DG Chest Portable 1 View  Result Date: 04/19/2021 CLINICAL DATA:  70 year old female with history of chest pain. EXAM: PORTABLE CHEST 1 VIEW COMPARISON:  Chest x-ray 02/17/2021. FINDINGS: Right subclavian single-lumen porta cath with tip terminating in the distal superior vena cava. Small calcified granuloma in the left upper lobe. 1.1 cm nodule projecting over the left mid lung corresponding to known left lower lobe pulmonary nodule, similar to prior studies. No acute consolidative airspace disease. No pleural effusions. No pneumothorax. No evidence of pulmonary edema. Heart size is borderline enlarged. Upper mediastinal contours are within normal limits. Aortic atherosclerosis. IMPRESSION: 1. No radiographic evidence of acute cardiopulmonary disease. 2. Stable left lower lobe pulmonary nodule. 3. Aortic atherosclerosis. Electronically Signed   By: Vinnie Langton M.D.   On: 04/19/2021 07:13     ASSESSMENT:  1.  T3N3c (stage IIIc) triple negative invasive lobular carcinoma of the left breast: - She felt lump in her left breast for more than 6 months, but thought it was secondary to fibrocystic disease which she had all her life.  When she started having pain, she reached out to Dr. Sherrie Sport. - She previously had mammogram machine malfunction and had severe pain and traumatized by that experience.  She was having ultrasound of the breast every other year since then. - She was on hormone replacement therapy for close to 10 years (from age [age]). - Ultrasound of the left breast on 01/08/2021 showed hyper vascular hypoechoic mass at 12 o'clock position measuring 3.8 x 1.3 x 2.5 cm.  There are calcifications located within the mass.  Mass extends to the level of the skin  with mild associated skin thickening.  At least 3 morphologically abnormal lymph nodes in the left axilla. - Mammogram showed irregular spiculated mass with associated calcifications in the retroareolar to upper left breast measuring approximately 6 cm.  There are at least 4 morphologically abnormal lymph nodes identified in the left axilla. - Ultrasound-guided left breast and left axillary lymph node biopsy on 01/15/2021 - Pathology consistent with invasive lobular carcinoma, E-cadherin negative.  ER/PR/HER2 negative.  HER2 2+ by IHC, negative by FISH.  Ki-67 is 5%.  Lymph node core biopsy was consistent with metastatic carcinoma.  Grade 2. - PET scan on 02/03/2021 showed involvement of left supraclavicular, subpectoral, axillary lymph nodes along with the breast mass.  10 mm left lung nodule which is hypometabolic.  Spinal cord lesion at T12 level with a strong uptake. - MRI of the lumbar spine with and without contrast on 02/20/2021 showed no mass or abnormal enhancement within the canal at the T12 level to correspond to the site of PET scan positive.  No marrow replacing bone lesion. - 2D echo on 02/21/2021 with EF 55-60%. - Weekly carboplatin and paclitaxel with every 3 weeks pembrolizumab ([id number]) started on 02/27/2021.   2.  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  She is non-smoker. - Mother died of breast cancer.  Maternal grandmother died very young in her 52s, sister has fibrocystic disease.  Father died of lung cancer and was a smoker.   PLAN:  1.  T3N3c triple negative invasive lobular carcinoma of the left breast: - Chemotherapy was held last week due to severe neutropenia. - She had to receive G-CSF 3 days last week.  Today she feels that her energy and appetite levels are 100%. - Reviewed labs today which shows white count 9.8 with ANC of 4.9.  Platelet count is normal.  LFTs are normal. - She will proceed with her treatment  today. - RTC on 05/01/2021 with labs on 04/30/2021 to see if she needs any G-CSF.  2.  Hypothyroidism: - TSH today is 5.34. - Continue Synthroid 75 mcg daily.  3.  Chest pain: - She started having sternal pain on Friday night at 9 PM which radiated to the back. - She was evaluated on Saturday in the ER and cardiac enzymes and EKG were normal.  Pain subsided after 4 baby aspirin's and nitroglycerin x2.  Pain apparently spread to the legs and all over the body on Saturday. - I have reviewed ER records.  Etiology of pain is highly likely due to G-CSF which she received on Wednesday, Thursday and Friday last week.  4.  Hypomagnesemia: - Magnesium today is 1.6.  She will receive magnesium oxide in our office.  If it is persistently low, will consider starting her on magnesium at home.   Orders placed this encounter:  No orders of the defined types were placed in this encounter.    Derek Jack, MD [place] [phone]   I, Thana Ates, am acting as a scribe for Dr. Derek Jack.  I, Derek Jack MD, have reviewed the above documentation for accuracy and completeness, and I agree with the above.

## 2021-04-19 NOTE — ED Triage Notes (Signed)
Pt states she started having chest pain yesterday. Pt is cancer pt (breast, stg 3) and states she has been given Neupogen shots for last 3 days d/t low wbc's and developed the chest pain yesterday. Pt describes it as a pressure feeling that radiates to her back as well as down her L arm.

## 2021-04-19 NOTE — ED Provider Notes (Signed)
[facility] EMERGENCY DEPARTMENT Provider Note   CSN: [id number] Arrival date & time: 04/19/21  0616     History Chief Complaint  Patient presents with   Chest Pain    Denise [name] is a 70 y.o. female.  The history is provided by the patient.  Chest Pain She has history of hypertension, asthma, breast cancer comes in with chest pain which started about 9 PM.  Pain is over the central sternal area with some radiation to the back.  She describes a pressure feeling which comes in waves.  She will not put a number on it, but states that pain is very bad when the wave comes and almost completely goes away.  There is no associated dyspnea, nausea, diaphoresis.  She does relate that she received Neupogen yesterday and states she has had 3 Neupogen injections over the last week.  She is a non-smoker without history of diabetes, hyperlipidemia and there is no family history of premature coronary atherosclerosis.  She has not done anything to treat her pain at home.  She has not noticed anything that seems to affect it-either improving it, or easing it off.   Past Medical History:  Diagnosis Date   Asthma    as child   Cancer ([place]) 12/2020   left breast IMC   Complication of anesthesia    pateitn states,' I coded when I had my D&Cmany years ago.   Family history of breast cancer    Hypertension    Hypothyroidism    PONV (postoperative nausea and vomiting)    Port-A-Cath in place 02/26/2021    Patient Active Problem List   Diagnosis Date Noted   Drug-induced neutropenia ([place]) 04/10/2021   Pancytopenia, acquired ([place]) 04/02/2021   Livedo reticularis 04/02/2021   Port-A-Cath in place 02/26/2021   Genetic testing 02/24/2021   Family history of breast cancer    Invasive lobular carcinoma of left breast in female ([place]) 01/23/2021   Non-intractable vomiting with nausea 01/11/2018   Abdominal pain 01/11/2018   Rectal bleeding 01/11/2018    Past Surgical History:  Procedure Laterality  Date   ABDOMINAL HYSTERECTOMY     BIOPSY  01/17/2018   Procedure: BIOPSY;  Surgeon: Rogene Houston, MD;  [facility];  Service: Endoscopy;;  duodenum,gastric   CHOLECYSTECTOMY     COLONOSCOPY WITH PROPOFOL N/A 01/17/2018   Procedure: COLONOSCOPY WITH PROPOFOL;  Surgeon: Rogene Houston, MD;  Location: [facility];  Service: Endoscopy;  Laterality: N/A;  7:30   DILATION AND CURETTAGE OF UTERUS     ESOPHAGOGASTRODUODENOSCOPY (EGD) WITH PROPOFOL N/A 01/17/2018   Procedure: ESOPHAGOGASTRODUODENOSCOPY (EGD) WITH PROPOFOL;  Surgeon: Rogene Houston, MD;  [facility];  Service: Endoscopy;  Laterality: N/A;   POLYPECTOMY  01/17/2018   Procedure: POLYPECTOMY;  Surgeon: Rogene Houston, MD;  Location[facility];  Service: Endoscopy;;  transverse colon, cecal   PORTACATH PLACEMENT Right 02/17/2021   Procedure: INSERTION PORT-A-CATH;  Surgeon: Coralie Keens, MD;  Location: [place];  Service: General;  Laterality: Right;     OB History   No obstetric history on file.     Family History  Problem Relation Age of Onset   Breast cancer Mother        dx in her 68s   Heart disease Mother    Thyroid disease Mother    Lung cancer Father        dx in his 23s   Thyroid disease Maternal Aunt  Thyroid nodules Maternal Grandmother 35       goiter   Thyroid disease Maternal Grandmother    Heart disease Maternal Grandfather    Heart disease Paternal Grandmother    Heart disease Paternal Grandfather    Thyroid disease Daughter    Thyroid disease Daughter    Cancer Maternal Uncle        NOS   Cancer Paternal Uncle        NOS   Breast cancer Cousin        pat first cousin died in her 62s;     Social History   Tobacco Use   Smoking status: Never   Smokeless tobacco: Never  Vaping Use   Vaping Use: Never used  Substance Use Topics   Alcohol use: Never   Drug use: Never    Home Medications Prior to Admission medications   Medication Sig Start  Date End Date Taking? Authorizing Provider  Acetaminophen (TYLENOL PO) Take 1 tablet by mouth as needed.    [provider]  albuterol (VENTOLIN HFA) 108 (90 Base) MCG/ACT inhaler Inhale into the lungs. 11/07/17   [provider]  benazepril (LOTENSIN) 20 MG tablet Take 20 mg by mouth daily. 03/10/21   [provider]  CARBOPLATIN IV Inject into the vein once a week. 02/27/21   [provider]  doxepin (SINEQUAN) 10 MG capsule Take 10 mg by mouth at bedtime. 03/11/21   [provider]  famotidine (PEPCID) 20 MG tablet Take 1 tablet (20 mg total) by mouth daily. 04/16/21   Derek Jack, MD  gabapentin (NEURONTIN) 300 MG capsule Take 1 capsule by mouth at bedtime. 11/05/20   [provider]  hydrochlorothiazide (MICROZIDE) 12.5 MG capsule Take 12.5 mg by mouth daily. 11/05/20   [provider]  levothyroxine (SYNTHROID) 75 MCG tablet Take 100 mcg by mouth daily before breakfast.    [provider]  lidocaine-prilocaine (EMLA) cream Apply a small amount to port a cath site and cover with plastic wrap 1 hour prior to infusion appointments 02/26/21   Derek Jack, MD  lisinopril (PRINIVIL,ZESTRIL) 40 MG tablet Take 40 mg by mouth daily.    [provider]  Melatonin 5 MG TABS Take 5 mg by mouth. As needed for sleep    [provider]  Omega-3 Fatty Acids (FISH OIL PO) Take by mouth.    [provider]  PACLITAXEL IV Inject into the vein once a week. 02/27/21   [provider]  Pembrolizumab (KEYTRUDA IV) Inject into the vein every 21 ( twenty-one) days. 02/27/21   [provider]  prochlorperazine (COMPAZINE) 10 MG tablet Take 1 tablet (10 mg total) by mouth every 6 (six) hours as needed (Nausea or vomiting). 02/26/21   Derek Jack, MD  psyllium (METAMUCIL) 58.6 % packet Take by mouth.    [provider]  triamcinolone cream (KENALOG) 0.1 % SMARTSIG:1 Application  Topical 2-3 Times Daily 03/10/21   [provider]    Allergies    Exforge [amlodipine besylate-valsartan], Cheese, Strawberry extract, Tramadol hcl, and Yeast-related products  Review of Systems   Review of Systems  Cardiovascular:  Positive for chest pain.  All other systems reviewed and are negative.  Physical Exam Updated Vital Signs BP 132/75 (BP Location: Left Arm)   Pulse 74   Temp 98.3 F (36.8 C) (Oral)   Resp 16   Ht '5\' 5"'$  (1.651 m)   Wt 81 kg   SpO2 100%   BMI 29.72  kg/m   Physical Exam Vitals and nursing note reviewed.  70 year old female, resting comfortably and in no acute distress. Vital signs are normal. Oxygen saturation is 100%, which is normal. Head is normocephalic and atraumatic. PERRLA, EOMI. Oropharynx is clear. Neck is nontender and supple without adenopathy or JVD. Back is nontender and there is no CVA tenderness. Lungs are clear without rales, wheezes, or rhonchi. Chest is minimally tender over the lower sternum.  Mediport is present on the right. Heart has regular rate and rhythm without murmur. Abdomen is soft, flat, nontender without masses or hepatosplenomegaly and peristalsis is normoactive. Extremities have no cyanosis or edema, full range of motion is present. Skin is warm and dry without rash. Neurologic: Mental status is normal, cranial nerves are intact, there are no motor or sensory deficits.  ED Results / Procedures / Treatments   Labs (all labs ordered are listed, but only abnormal results are displayed) Labs Reviewed  BASIC METABOLIC PANEL  CBC  D-DIMER, QUANTITATIVE  TROPONIN I (HIGH SENSITIVITY)    EKG EKG Interpretation  Date/Time:  Saturday April 19 2021 06:24:27 EDT Ventricular Rate:  76 PR Interval:  149 QRS Duration: 94 QT Interval:  400 QTC Calculation: 450 R Axis:   20 Text Interpretation: Sinus rhythm Low voltage, precordial leads When compared with ECG of 02/12/2021, No significant change was found  Confirmed by Delora Fuel ([id number]) on 04/19/2021 6:29:10 AM  Radiology No results found.  Procedures Procedures   Medications Ordered in ED Medications  aspirin chewable tablet 324 mg (has no administration in time range)  nitroGLYCERIN (NITROSTAT) SL tablet 0.4 mg (has no administration in time range)    ED Course  I have reviewed the triage vital signs and the nursing notes.  Pertinent labs & imaging results that were available during my care of the patient were reviewed by me and considered in my medical decision making (see chart for details).   MDM Rules/Calculators/A&P                         Chest pain of uncertain cause.  ECG shows no acute changes.  Old records are reviewed, and she did get infusions of filgrastim on 8/3, 8/4, 8/5.  Pain is atypical for ACS, also atypical for pulmonary embolism but she is at risk for pulmonary embolism given active cancer.  I suspect pain may be related to bone marrow stimulation by multiple doses of filgrastim.  She will be given aspirin and therapeutic trial of nitroglycerin, screening labs are obtained including troponin and D-dimer.  Case is signed out to Dr. Sabra Heck.  Final Clinical Impression(s) / ED Diagnoses Final diagnoses:  Nonspecific chest pain    Rx / DC Orders ED Discharge Orders     None        Delora Fuel, MD [id number] [phone]

## 2021-04-21 ENCOUNTER — Other Ambulatory Visit (HOSPITAL_COMMUNITY): Payer: Medicare Other

## 2021-04-21 ENCOUNTER — Encounter (HOSPITAL_COMMUNITY): Payer: Self-pay

## 2021-04-21 ENCOUNTER — Ambulatory Visit (HOSPITAL_COMMUNITY): Payer: Medicare Other

## 2021-04-21 ENCOUNTER — Inpatient Hospital Stay (HOSPITAL_BASED_OUTPATIENT_CLINIC_OR_DEPARTMENT_OTHER): Payer: Medicare Other | Admitting: Hematology

## 2021-04-21 ENCOUNTER — Other Ambulatory Visit: Payer: Self-pay

## 2021-04-21 ENCOUNTER — Inpatient Hospital Stay (HOSPITAL_COMMUNITY): Payer: Medicare Other

## 2021-04-21 ENCOUNTER — Encounter (HOSPITAL_COMMUNITY): Payer: Self-pay | Admitting: Hematology

## 2021-04-21 VITALS — BP 133/85 | HR 65 | Temp 97.0°F | Resp 17

## 2021-04-21 DIAGNOSIS — D709 Neutropenia, unspecified: Secondary | ICD-10-CM | POA: Diagnosis not present

## 2021-04-21 DIAGNOSIS — Z5111 Encounter for antineoplastic chemotherapy: Secondary | ICD-10-CM | POA: Diagnosis not present

## 2021-04-21 DIAGNOSIS — Z171 Estrogen receptor negative status [ER-]: Secondary | ICD-10-CM | POA: Diagnosis not present

## 2021-04-21 DIAGNOSIS — C50912 Malignant neoplasm of unspecified site of left female breast: Secondary | ICD-10-CM

## 2021-04-21 DIAGNOSIS — Z95828 Presence of other vascular implants and grafts: Secondary | ICD-10-CM

## 2021-04-21 DIAGNOSIS — G620 Drug-induced polyneuropathy: Secondary | ICD-10-CM | POA: Diagnosis not present

## 2021-04-21 DIAGNOSIS — E039 Hypothyroidism, unspecified: Secondary | ICD-10-CM | POA: Diagnosis not present

## 2021-04-21 LAB — CBC WITH DIFFERENTIAL/PLATELET
Band Neutrophils: 10 %
Basophils Absolute: 0.1 10*3/uL (ref 0.0–0.1)
Basophils Relative: 1 %
Eosinophils Absolute: 0.2 10*3/uL (ref 0.0–0.5)
Eosinophils Relative: 2 %
HCT: 30.7 % — ABNORMAL LOW (ref 36.0–46.0)
Hemoglobin: 10 g/dL — ABNORMAL LOW (ref 12.0–15.0)
Lymphocytes Relative: 30 %
Lymphs Abs: 2.9 10*3/uL (ref 0.7–4.0)
MCH: 33.2 pg (ref 26.0–34.0)
MCHC: 32.6 g/dL (ref 30.0–36.0)
MCV: 102 fL — ABNORMAL HIGH (ref 80.0–100.0)
Metamyelocytes Relative: 5 %
Monocytes Absolute: 0.8 10*3/uL (ref 0.1–1.0)
Monocytes Relative: 8 %
Myelocytes: 3 %
Neutro Abs: 4.9 10*3/uL (ref 1.7–7.7)
Neutrophils Relative %: 40 %
Platelets: 185 10*3/uL (ref 150–400)
Promyelocytes Relative: 1 %
RBC: 3.01 MIL/uL — ABNORMAL LOW (ref 3.87–5.11)
RDW: 13.8 % (ref 11.5–15.5)
WBC Morphology: INCREASED
WBC: 9.8 10*3/uL (ref 4.0–10.5)
nRBC: 0.5 % — ABNORMAL HIGH (ref 0.0–0.2)

## 2021-04-21 LAB — COMPREHENSIVE METABOLIC PANEL
ALT: 24 U/L (ref 0–44)
AST: 28 U/L (ref 15–41)
Albumin: 3.4 g/dL — ABNORMAL LOW (ref 3.5–5.0)
Alkaline Phosphatase: 69 U/L (ref 38–126)
Anion gap: 3 — ABNORMAL LOW (ref 5–15)
BUN: 9 mg/dL (ref 8–23)
CO2: 27 mmol/L (ref 22–32)
Calcium: 8.4 mg/dL — ABNORMAL LOW (ref 8.9–10.3)
Chloride: 109 mmol/L (ref 98–111)
Creatinine, Ser: 0.71 mg/dL (ref 0.44–1.00)
GFR, Estimated: >60 mL/min (ref >60)
Glucose, Bld: 108 mg/dL — ABNORMAL HIGH (ref 70–99)
Potassium: 3.8 mmol/L (ref 3.5–5.1)
Sodium: 139 mmol/L (ref 135–145)
Total Bilirubin: 0.4 mg/dL (ref 0.3–1.2)
Total Protein: 6.3 g/dL — ABNORMAL LOW (ref 6.5–8.1)

## 2021-04-21 LAB — TSH: TSH: 5.344 u[IU]/mL — ABNORMAL HIGH (ref 0.350–4.500)

## 2021-04-21 LAB — MAGNESIUM: Magnesium: 1.6 mg/dL — ABNORMAL LOW (ref 1.7–2.4)

## 2021-04-21 MED ORDER — PALONOSETRON HCL INJECTION 0.25 MG/5ML
INTRAVENOUS | Status: AC
  Filled 2021-04-21: qty 5

## 2021-04-21 MED ORDER — DIPHENHYDRAMINE HCL 50 MG/ML IJ SOLN
INTRAMUSCULAR | Status: AC
  Filled 2021-04-21: qty 1

## 2021-04-21 MED ORDER — SODIUM CHLORIDE 0.9% FLUSH
10.0000 mL | INTRAVENOUS | Status: DC | PRN
  Administered 2021-04-21: 10 mL

## 2021-04-21 MED ORDER — SODIUM CHLORIDE 0.9 % IV SOLN
80.0000 mg/m2 | Freq: Once | INTRAVENOUS | Status: AC
  Administered 2021-04-21: 156 mg via INTRAVENOUS
  Filled 2021-04-21: qty 26

## 2021-04-21 MED ORDER — SODIUM CHLORIDE 0.9 % IV SOLN
10.0000 mg | Freq: Once | INTRAVENOUS | Status: AC
  Administered 2021-04-21: 10 mg via INTRAVENOUS
  Filled 2021-04-21: qty 10

## 2021-04-21 MED ORDER — HEPARIN SOD (PORK) LOCK FLUSH 100 UNIT/ML IV SOLN
500.0000 [IU] | Freq: Once | INTRAVENOUS | Status: AC | PRN
  Administered 2021-04-21: 500 [IU]

## 2021-04-21 MED ORDER — DIPHENHYDRAMINE HCL 50 MG/ML IJ SOLN
50.0000 mg | Freq: Once | INTRAMUSCULAR | Status: AC
  Administered 2021-04-21: 50 mg via INTRAVENOUS

## 2021-04-21 MED ORDER — FAMOTIDINE 20 MG IN NS 100 ML IVPB
20.0000 mg | Freq: Once | INTRAVENOUS | Status: AC
  Administered 2021-04-21: 20 mg via INTRAVENOUS
  Filled 2021-04-21: qty 20

## 2021-04-21 MED ORDER — SODIUM CHLORIDE 0.9 % IV SOLN
136.8000 mg | Freq: Once | INTRAVENOUS | Status: AC
  Administered 2021-04-21: 140 mg via INTRAVENOUS
  Filled 2021-04-21: qty 14

## 2021-04-21 MED ORDER — PALONOSETRON HCL INJECTION 0.25 MG/5ML
0.2500 mg | Freq: Once | INTRAVENOUS | Status: AC
  Administered 2021-04-21: 0.25 mg via INTRAVENOUS

## 2021-04-21 MED ORDER — SODIUM CHLORIDE 0.9 % IV SOLN: Freq: Once | INTRAVENOUS | Status: AC

## 2021-04-21 NOTE — Progress Notes (Signed)
Patient presents today for chemotherapy treatment.  WBC is 9.8 today.  Patient will receive Magnesium oxide 400 mg PO x 1 dose per Dr. Delton Coombes.  [place] for treatment today per Dr. Worthy Keeler.    Patient tolerated treatment well with no complaints voiced.  Patient left ambulatory in stable condition.  Vital signs stable at discharge.  Follow up as scheduled.

## 2021-04-21 NOTE — Patient Instructions (Signed)
[place] at [facility] Discharge Instructions  You were seen today by Dr. Delton Coombes. He went over your recent results, and you received your treatment. Dr. Delton Coombes will see you back in 05/01/21 for labs and follow up.   Thank you for choosing [facility] at [facility] to provide your oncology and hematology care.  To afford each patient quality time with our provider, please arrive at least 15 minutes before your scheduled appointment time.   If you have a lab appointment with the [facility] please come in thru the Main Entrance and check in at the main information desk  You need to re-schedule your appointment should you arrive 10 or more minutes late.  We strive to give you quality time with our providers, and arriving late affects you and other patients whose appointments are after yours.  Also, if you no show three or more times for appointments you may be dismissed from the clinic at the providers discretion.     Again, thank you for choosing [facility].  Our hope is that these requests will decrease the amount of time that you wait before being seen by our physicians.       _____________________________________________________________  Should you have questions after your visit to [facility], please contact our office at [phone] between the hours of 8:00 a.m. and 4:30 p.m.  Voicemails left after 4:00 p.m. will not be returned until the following business day.  For prescription refill requests, have your pharmacy contact our office and allow 72 hours.    Cancer Center Support Programs:   > Cancer Support Group  2nd Tuesday of the month 1pm-2pm, Journey Room

## 2021-04-21 NOTE — Patient Instructions (Signed)
[facility]  Discharge Instructions: Thank you for choosing [facility] to provide your oncology and hematology care.  If you have a lab appointment with the Cancer Center, please come in thru the Main Entrance and check in at the main information desk.  Wear comfortable clothing and clothing appropriate for easy access to any Portacath or PICC line.   We strive to give you quality time with your provider. You may need to reschedule your appointment if you arrive late (15 or more minutes).  Arriving late affects you and other patients whose appointments are after yours.  Also, if you miss three or more appointments without notifying the office, you may be dismissed from the clinic at the provider's discretion.      For prescription refill requests, have your pharmacy contact our office and allow 72 hours for refills to be completed.        To help prevent nausea and vomiting after your treatment, we encourage you to take your nausea medication as directed.  BELOW ARE SYMPTOMS THAT SHOULD BE REPORTED IMMEDIATELY: *FEVER GREATER THAN 100.4 F (38 C) OR HIGHER *CHILLS OR SWEATING *NAUSEA AND VOMITING THAT IS NOT CONTROLLED WITH YOUR NAUSEA MEDICATION *UNUSUAL SHORTNESS OF BREATH *UNUSUAL BRUISING OR BLEEDING *URINARY PROBLEMS (pain or burning when urinating, or frequent urination) *BOWEL PROBLEMS (unusual diarrhea, constipation, pain near the anus) TENDERNESS IN MOUTH AND THROAT WITH OR WITHOUT PRESENCE OF ULCERS (sore throat, sores in mouth, or a toothache) UNUSUAL RASH, SWELLING OR PAIN  UNUSUAL VAGINAL DISCHARGE OR ITCHING   Items with * indicate a potential emergency and should be followed up as soon as possible or go to the Emergency Department if any problems should occur.  Please show the CHEMOTHERAPY ALERT CARD or IMMUNOTHERAPY ALERT CARD at check-in to the Emergency Department and triage nurse.  Should you have questions after your visit or need to cancel  or reschedule your appointment, please contact [facility] [phone]  and follow the prompts.  Office hours are 8:00 a.m. to 4:30 p.m. Monday - Friday. Please note that voicemails left after 4:00 p.m. may not be returned until the following business day.  We are closed weekends and major holidays. You have access to a nurse at all times for urgent questions. Please call the main number to the clinic [phone] and follow the prompts.  For any non-urgent questions, you may also contact your provider using MyChart. We now offer e-Visits for anyone 18 and older to request care online for non-urgent symptoms. For details visit [email or web address].   Also download the MyChart app! Go to the app store, search "MyChart", open the app, select [facility], and log in with your MyChart username and password.  Due to Covid, a mask is required upon entering the hospital/clinic. If you do not have a mask, one will be given to you upon arrival. For doctor visits, patients may have 1 support person aged 18 or older with them. For treatment visits, patients cannot have anyone with them due to current Covid guidelines and our immunocompromised population.  

## 2021-04-22 LAB — T4: T4, Total: 7 ug/dL (ref 4.5–12.0)

## 2021-04-23 ENCOUNTER — Other Ambulatory Visit (HOSPITAL_COMMUNITY): Payer: Medicare Other

## 2021-04-24 ENCOUNTER — Ambulatory Visit (HOSPITAL_COMMUNITY): Payer: Medicare Other

## 2021-04-24 ENCOUNTER — Ambulatory Visit (HOSPITAL_COMMUNITY): Payer: Medicare Other | Admitting: Hematology

## 2021-04-30 ENCOUNTER — Inpatient Hospital Stay (HOSPITAL_COMMUNITY): Payer: Medicare Other

## 2021-04-30 ENCOUNTER — Other Ambulatory Visit: Payer: Self-pay

## 2021-04-30 ENCOUNTER — Encounter (HOSPITAL_COMMUNITY): Payer: Self-pay

## 2021-04-30 VITALS — BP 105/66 | HR 70 | Temp 97.1°F | Resp 18

## 2021-04-30 DIAGNOSIS — Z95828 Presence of other vascular implants and grafts: Secondary | ICD-10-CM

## 2021-04-30 DIAGNOSIS — C50912 Malignant neoplasm of unspecified site of left female breast: Secondary | ICD-10-CM | POA: Diagnosis not present

## 2021-04-30 DIAGNOSIS — D702 Other drug-induced agranulocytosis: Secondary | ICD-10-CM

## 2021-04-30 DIAGNOSIS — Z171 Estrogen receptor negative status [ER-]: Secondary | ICD-10-CM | POA: Diagnosis not present

## 2021-04-30 DIAGNOSIS — Z5111 Encounter for antineoplastic chemotherapy: Secondary | ICD-10-CM | POA: Diagnosis not present

## 2021-04-30 DIAGNOSIS — E039 Hypothyroidism, unspecified: Secondary | ICD-10-CM | POA: Diagnosis not present

## 2021-04-30 DIAGNOSIS — G620 Drug-induced polyneuropathy: Secondary | ICD-10-CM | POA: Diagnosis not present

## 2021-04-30 DIAGNOSIS — D709 Neutropenia, unspecified: Secondary | ICD-10-CM | POA: Diagnosis not present

## 2021-04-30 LAB — COMPREHENSIVE METABOLIC PANEL
ALT: 16 U/L (ref 0–44)
AST: 15 U/L (ref 15–41)
Albumin: 3.7 g/dL (ref 3.5–5.0)
Alkaline Phosphatase: 52 U/L (ref 38–126)
Anion gap: 6 (ref 5–15)
BUN: 15 mg/dL (ref 8–23)
CO2: 28 mmol/L (ref 22–32)
Calcium: 8.6 mg/dL — ABNORMAL LOW (ref 8.9–10.3)
Chloride: 106 mmol/L (ref 98–111)
Creatinine, Ser: 0.77 mg/dL (ref 0.44–1.00)
GFR, Estimated: >60 mL/min (ref >60)
Glucose, Bld: 99 mg/dL (ref 70–99)
Potassium: 4.2 mmol/L (ref 3.5–5.1)
Sodium: 140 mmol/L (ref 135–145)
Total Bilirubin: 0.4 mg/dL (ref 0.3–1.2)
Total Protein: 6.6 g/dL (ref 6.5–8.1)

## 2021-04-30 LAB — CBC WITH DIFFERENTIAL/PLATELET
Abs Immature Granulocytes: 0 10*3/uL (ref 0.00–0.07)
Basophils Absolute: 0 10*3/uL (ref 0.0–0.1)
Basophils Relative: 1 %
Eosinophils Absolute: 0 10*3/uL (ref 0.0–0.5)
Eosinophils Relative: 2 %
HCT: 31.8 % — ABNORMAL LOW (ref 36.0–46.0)
Hemoglobin: 10.3 g/dL — ABNORMAL LOW (ref 12.0–15.0)
Immature Granulocytes: 0 %
Lymphocytes Relative: 56 %
Lymphs Abs: 1.1 10*3/uL (ref 0.7–4.0)
MCH: 33.1 pg (ref 26.0–34.0)
MCHC: 32.4 g/dL (ref 30.0–36.0)
MCV: 102.3 fL — ABNORMAL HIGH (ref 80.0–100.0)
Monocytes Absolute: 0.3 10*3/uL (ref 0.1–1.0)
Monocytes Relative: 15 %
Neutro Abs: 0.5 10*3/uL — ABNORMAL LOW (ref 1.7–7.7)
Neutrophils Relative %: 26 %
Platelets: 170 10*3/uL (ref 150–400)
RBC: 3.11 MIL/uL — ABNORMAL LOW (ref 3.87–5.11)
RDW: 13.1 % (ref 11.5–15.5)
WBC: 1.9 10*3/uL — ABNORMAL LOW (ref 4.0–10.5)
nRBC: 0 % (ref 0.0–0.2)

## 2021-04-30 LAB — MAGNESIUM: Magnesium: 1.7 mg/dL (ref 1.7–2.4)

## 2021-04-30 MED ORDER — FILGRASTIM-SNDZ 480 MCG/0.8ML IJ SOSY
480.0000 ug | PREFILLED_SYRINGE | Freq: Once | INTRAMUSCULAR | Status: AC
  Administered 2021-04-30: 480 ug via SUBCUTANEOUS
  Filled 2021-04-30: qty 0.8

## 2021-04-30 NOTE — Patient Instructions (Signed)
[facility]  Discharge Instructions: Thank you for choosing [facility] to provide your oncology and hematology care.  If you have a lab appointment with the [facility], please come in thru the Main Entrance and check in at the main information desk.  Wear comfortable clothing and clothing appropriate for easy access to any Portacath or PICC line.   We strive to give you quality time with your provider. You may need to reschedule your appointment if you arrive late (15 or more minutes).  Arriving late affects you and other patients whose appointments are after yours.  Also, if you miss three or more appointments without notifying the office, you may be dismissed from the clinic at the provider's discretion.      For prescription refill requests, have your pharmacy contact our office and allow 72 hours for refills to be completed.    Today you received the following chemotherapy and/or immunotherapy agents zarxio.       To help prevent nausea and vomiting after your treatment, we encourage you to take your nausea medication as directed.  BELOW ARE SYMPTOMS THAT SHOULD BE REPORTED IMMEDIATELY: *FEVER GREATER THAN 100.4 F (38 C) OR HIGHER *CHILLS OR SWEATING *NAUSEA AND VOMITING THAT IS NOT CONTROLLED WITH YOUR NAUSEA MEDICATION *UNUSUAL SHORTNESS OF BREATH *UNUSUAL BRUISING OR BLEEDING *URINARY PROBLEMS (pain or burning when urinating, or frequent urination) *BOWEL PROBLEMS (unusual diarrhea, constipation, pain near the anus) TENDERNESS IN MOUTH AND THROAT WITH OR WITHOUT PRESENCE OF ULCERS (sore throat, sores in mouth, or a toothache) UNUSUAL RASH, SWELLING OR PAIN  UNUSUAL VAGINAL DISCHARGE OR ITCHING   Items with * indicate a potential emergency and should be followed up as soon as possible or go to the Emergency Department if any problems should occur.  Please show the CHEMOTHERAPY ALERT CARD or IMMUNOTHERAPY ALERT CARD at check-in to the Emergency  Department and triage nurse.  Should you have questions after your visit or need to cancel or reschedule your appointment, please contact [facility] [phone]  and follow the prompts.  Office hours are 8:00 a.m. to 4:30 p.m. Monday - Friday. Please note that voicemails left after 4:00 p.m. may not be returned until the following business day.  We are closed weekends and major holidays. You have access to a nurse at all times for urgent questions. Please call the main number to the clinic [phone] and follow the prompts.  For any non-urgent questions, you may also contact your provider using MyChart. We now offer e-Visits for anyone 26 and older to request care online for non-urgent symptoms. For details visit [email or web address].   Also download the MyChart app! Go to the app store, search "MyChart", open the app, select [facility], and log in with your MyChart username and password.  Due to Covid, a mask is required upon entering the hospital/clinic. If you do not have a mask, one will be given to you upon arrival. For doctor visits, patients may have 1 support person aged 72 or older with them. For treatment visits, patients cannot have anyone with them due to current Covid guidelines and our immunocompromised population.

## 2021-04-30 NOTE — Progress Notes (Signed)
[place], [place] [address]   CLINIC:  Medical Oncology/Hematology  PCP:  Neale Burly, MD [place] / [place] [phone]   REASON FOR VISIT:  Follow-up for left breast cancer  PRIOR THERAPY: none  NGS Results: not done  CURRENT THERAPY: weekly carboplatin and paclitaxel along with pembrolizumab  BRIEF ONCOLOGIC HISTORY:  Oncology History  Invasive lobular carcinoma of left breast in female [name])  01/23/2021 Initial Diagnosis   Invasive lobular carcinoma of left breast in female [name])   02/19/2021 Genetic Testing   Negative genetic testing on the CancerNext-Expanded+RNAinsight panel.  SMARCB1 VUS identified.  The CancerNext-Expanded gene panel offered by [name] and includes sequencing and rearrangement analysis for the following 77 genes: AIP, ALK, APC*, ATM*, AXIN2, BAP1, BARD1, BLM, BMPR1A, BRCA1*, BRCA2*, BRIP1*, CDC73, CDH1*, CDK4, CDKN1B, CDKN2A, CHEK2*, CTNNA1, DICER1, FANCC, FH, FLCN, GALNT12, KIF1B, LZTR1, MAX, MEN1, MET, MLH1*, MSH2*, MSH3, MSH6*, MUTYH*, NBN, NF1*, NF2, NTHL1, PALB2*, PHOX2B, PMS2*, POT1, PRKAR1A, PTCH1, PTEN*, RAD51C*, RAD51D*, RB1, RECQL, RET, SDHA, SDHAF2, SDHB, SDHC, SDHD, SMAD4, SMARCA4, SMARCB1, SMARCE1, STK11, SUFU, TMEM127, TP53*, TSC1, TSC2, VHL and XRCC2 (sequencing and deletion/duplication); EGFR, EGLN1, HOXB13, KIT, MITF, PDGFRA, POLD1, and POLE (sequencing only); EPCAM and GREM1 (deletion/duplication only). DNA and RNA analyses performed for * genes. The report date is February 19, 2021.   02/27/2021 -  Chemotherapy    Patient is on Treatment Plan: BREAST PEMBROLIZUMAB + CARBOPLATIN D1,8,15+ PACLITAXEL D1,8,15 Q21D X 4 CYCLES / PEMBROLIZUMAB + AC Q21D X 4 CYCLES         CANCER STAGING: Cancer Staging Invasive lobular carcinoma of left breast in female [name]) Staging form: Breast, AJCC 8th Edition - Clinical stage from 01/23/2021: cT3, cN3c, G2, ER-, PR-, HER2- -  Unsigned   INTERVAL HISTORY:  Denise [name], a 70 y.o. female, returns for routine follow-up and consideration for next cycle of chemotherapy. [name] was last seen on 04/21/21.  Due for day #15 cycle #2 of carboplatin and paclitaxel today.   Overall, she tells me she has been feeling pretty well. She reports constant numbness in her toes as well as in her left fingers, but she denies any associated pain. She reports occasional nausea which is helped by yogurt and Pepcid, but she denies vomiting and weight loss. She reports pressure on her sternum while laying in bed. Her appetite is fair, and she reports occasional dizziness. She denies ankle swellings, diarrhea, SOB, and skin rash. She reports sore throat but attributes this to seasonal allergies.   Overall, she feels ready for next cycle of chemo today.   REVIEW OF SYSTEMS:  Review of Systems  Constitutional:  Positive for fatigue (50%). Negative for appetite change and unexpected weight change.  HENT:   Positive for sore throat.   Respiratory:  Negative for shortness of breath.   Cardiovascular:  Positive for chest pain (pressure). Negative for leg swelling.  Gastrointestinal:  Positive for nausea. Negative for diarrhea and vomiting.  Musculoskeletal:  Positive for arthralgias (1/10).  Skin:  Negative for rash.  Neurological:  Positive for dizziness, headaches and numbness (L finger and bialteral toes).  Psychiatric/Behavioral:  Positive for sleep disturbance.   All other systems reviewed and are negative.  PAST MEDICAL/SURGICAL HISTORY:  Past Medical History:  Diagnosis Date   Asthma    as child   Cancer ([name]) 12/2020   left breast IMC   Complication of anesthesia    pateitn states,' I  coded when I had my D&Cmany years ago.   Family history of breast cancer    Hypertension    Hypothyroidism    PONV (postoperative nausea and vomiting)    Port-A-Cath in place 02/26/2021   Past Surgical History:  Procedure Laterality  Date   ABDOMINAL HYSTERECTOMY     BIOPSY  01/17/2018   Procedure: BIOPSY;  Surgeon: Rogene Houston, MD;  [facility]: [name];  Service: Endoscopy;;  duodenum,gastric   CHOLECYSTECTOMY     COLONOSCOPY WITH PROPOFOL N/A 01/17/2018   Procedure: COLONOSCOPY WITH PROPOFOL;  Surgeon: Rogene Houston, MD;  Location: [name];  Service: Endoscopy;  Laterality: N/A;  7:30   DILATION AND CURETTAGE OF UTERUS     ESOPHAGOGASTRODUODENOSCOPY (EGD) WITH PROPOFOL N/A 01/17/2018   Procedure: ESOPHAGOGASTRODUODENOSCOPY (EGD) WITH PROPOFOL;  Surgeon: Rogene Houston, MD;  Location: [name];  Service: Endoscopy;  Laterality: N/A;   POLYPECTOMY  01/17/2018   Procedure: POLYPECTOMY;  Surgeon: Rogene Houston, MD;  [facility]: [name];  Service: Endoscopy;;  transverse colon, cecal   PORTACATH PLACEMENT Right 02/17/2021   Procedure: INSERTION PORT-A-CATH;  Surgeon: Coralie Keens, MD;  Location: [name];  Service: General;  Laterality: Right;    SOCIAL HISTORY:  Social History   Socioeconomic History   Marital status: Widowed    Spouse name: Not on file   Number of children: 3   Years of education: Not on file   Highest education level: Not on file  Occupational History   Occupation: [employer]   Occupation: retired  Tobacco Use   Smoking status: Never   Smokeless tobacco: Never  Scientific laboratory technician Use: Never used  Substance and Sexual Activity   Alcohol use: Never   Drug use: Never   Sexual activity: Not Currently    Birth control/protection: Surgical  Other Topics Concern   Not on file  Social History Narrative   Not on file   Social Determinants of Health   Financial Resource Strain: Low Risk    Difficulty of Paying Living Expenses: Not hard at all  Food Insecurity: No Food Insecurity   Worried About Charity fundraiser in the Last Year: Never true   [place] in the Last Year: Never true  Transportation Needs: No Transportation  Needs   Lack of Transportation (Medical): No   Lack of Transportation (Non-Medical): No  Physical Activity: Insufficiently Active   Days of Exercise per Week: 3 days   Minutes of Exercise per Session: 30 min  Stress: No Stress Concern Present   Feeling of Stress : Not at all  Social Connections: Moderately Integrated   Frequency of Communication with Friends and Family: More than three times a week   Frequency of Social Gatherings with Friends and Family: More than three times a week   Attends Religious Services: More than 4 times per year   Active Member of Clubs or Organizations: No   Attends Music therapist: More than 4 times per year   Marital Status: Widowed  Human resources officer Violence: Not At Risk   Fear of Current or Ex-Partner: No   Emotionally Abused: No   Physically Abused: No   Sexually Abused: No    FAMILY HISTORY:  Family History  Problem Relation Age of Onset   Breast cancer Mother        dx in her 48s   Heart disease Mother    Thyroid disease  Mother    Lung cancer Father        dx in his 36s   Thyroid disease Maternal Aunt    Thyroid nodules Maternal Grandmother 53       goiter   Thyroid disease Maternal Grandmother    Heart disease Maternal Grandfather    Heart disease Paternal Grandmother    Heart disease Paternal Grandfather    Thyroid disease Daughter    Thyroid disease Daughter    Cancer Maternal Uncle        NOS   Cancer Paternal Uncle        NOS   Breast cancer Cousin        pat first cousin died in her 31s;     CURRENT MEDICATIONS:  Current Outpatient Medications  Medication Sig Dispense Refill   Acetaminophen (TYLENOL PO) Take 1 tablet by mouth as needed.     albuterol (VENTOLIN HFA) 108 (90 Base) MCG/ACT inhaler Inhale into the lungs.     benazepril (LOTENSIN) 20 MG tablet Take 20 mg by mouth daily.     CARBOPLATIN IV Inject into the vein once a week.     doxepin (SINEQUAN) 10 MG capsule Take 10 mg by mouth at bedtime.      famotidine (PEPCID) 20 MG tablet Take 1 tablet (20 mg total) by mouth daily. 30 tablet 0   gabapentin (NEURONTIN) 300 MG capsule Take 1 capsule by mouth at bedtime.     hydrochlorothiazide (MICROZIDE) 12.5 MG capsule Take 12.5 mg by mouth daily.     levothyroxine (SYNTHROID) 75 MCG tablet Take 100 mcg by mouth daily before breakfast.     lidocaine-prilocaine (EMLA) cream Apply a small amount to port a cath site and cover with plastic wrap 1 hour prior to infusion appointments 30 g 3   lisinopril (PRINIVIL,ZESTRIL) 40 MG tablet Take 40 mg by mouth daily.     Melatonin 5 MG TABS Take 5 mg by mouth. As needed for sleep     Omega-3 Fatty Acids (FISH OIL PO) Take by mouth.     PACLITAXEL IV Inject into the vein once a week.     Pembrolizumab (KEYTRUDA IV) Inject into the vein every 21 ( twenty-one) days.     prochlorperazine (COMPAZINE) 10 MG tablet Take 1 tablet (10 mg total) by mouth every 6 (six) hours as needed (Nausea or vomiting). 30 tablet 1   psyllium (METAMUCIL) 58.6 % packet Take by mouth.     triamcinolone cream (KENALOG) 0.1 % SMARTSIG:1 Application Topical 2-3 Times Daily     No current facility-administered medications for this visit.    ALLERGIES:  Allergies  Allergen Reactions   Exforge [Amlodipine Besylate-Valsartan] Nausea And Vomiting   Cheese    Strawberry Extract Diarrhea    Seeds,nuts, lettuce, grapes   Tramadol Hcl Nausea And Vomiting   Yeast-Related Products Hives    bread    PHYSICAL EXAM:  Performance status (ECOG): 1 - Symptomatic but completely ambulatory  There were no vitals filed for this visit. Wt Readings from Last 3 Encounters:  04/21/21 182 lb 9.6 oz (82.8 kg)  04/19/21 178 lb 9.2 oz (81 kg)  04/10/21 179 lb (81.2 kg)   Physical Exam Vitals reviewed.  Constitutional:      Appearance: Normal appearance.  Cardiovascular:     Rate and Rhythm: Normal rate and regular rhythm.     Pulses: Normal pulses.     Heart sounds: Normal heart sounds.   Pulmonary:     Effort:  Pulmonary effort is normal.     Breath sounds: Normal breath sounds.  Musculoskeletal:     Right lower leg: No edema.     Left lower leg: No edema.  Neurological:     General: No focal deficit present.     Mental Status: She is alert and oriented to person, place, and time.  Psychiatric:        Mood and Affect: Mood normal.        Behavior: Behavior normal.    LABORATORY DATA:  I have reviewed the labs as listed.  CBC Latest Ref Rng & Units 04/30/2021 04/21/2021 04/19/2021  WBC 4.0 - 10.5 K/uL 1.9(L) 9.8 2.4(L)  Hemoglobin 12.0 - 15.0 g/dL 10.3(L) 10.0(L) 10.4(L)  Hematocrit 36.0 - 46.0 % 31.8(L) 30.7(L) 30.9(L)  Platelets 150 - 400 K/uL 170 185 173   CMP Latest Ref Rng & Units 04/30/2021 04/21/2021 04/19/2021  Glucose 70 - 99 mg/dL 99 108(H) 85  BUN 8 - 23 mg/dL '15 9 12  ' Creatinine 0.44 - 1.00 mg/dL 0.77 0.71 0.78  Sodium 135 - 145 mmol/L 140 139 139  Potassium 3.5 - 5.1 mmol/L 4.2 3.8 4.2  Chloride 98 - 111 mmol/L 106 109 109  CO2 22 - 32 mmol/L '28 27 26  ' Calcium 8.9 - 10.3 mg/dL 8.6(L) 8.4(L) 8.9  Total Protein 6.5 - 8.1 g/dL 6.6 6.3(L) -  Total Bilirubin 0.3 - 1.2 mg/dL 0.4 0.4 -  Alkaline Phos 38 - 126 U/L 52 69 -  AST 15 - 41 U/L 15 28 -  ALT 0 - 44 U/L 16 24 -    DIAGNOSTIC IMAGING:  I have independently reviewed the scans and discussed with the patient. NM PET Image Restage (PS) Skull Base to Thigh  Result Date: 04/06/2021 CLINICAL DATA:  Subsequent treatment strategy for breast cancer. Ongoing chemotherapy. EXAM: NUCLEAR MEDICINE PET SKULL BASE TO THIGH TECHNIQUE: 8.9 mCi F-18 FDG was injected intravenously. Full-ring PET imaging was performed from the skull base to thigh after the radiotracer. CT data was obtained and used for attenuation correction and anatomic localization. Fasting blood glucose: 98 mg/dl COMPARISON:  02/03/2021, and CT abdomen from 03/02/2021 FINDINGS: Mediastinal blood pool activity: SUV max 2.7 Liver activity: SUV max NA NECK:  No significant abnormal hypermetabolic activity in this region. Incidental CT findings: Right mastoid effusion. CHEST: Index left axillary lymph node 1.3 cm in short axis on image 59 series 4 (formerly 1.8 cm), maximum SUV 4.5 (formerly 5.9). Multiple other mildly hypermetabolic left axillary and subpectoral lymph nodes are again observed, mildly reduced in size and activity compared to previous. Left lateral breast glandular tissues with maximum SUV 3.4 (formerly 4.0) as compared to contralateral side glandular tissues with maximum SUV of 3.2. Left lower lobe nodule measuring 1.0 by 1.0 cm on image 27 series 8 is stable, maximum SUV 0.8 (formerly 0.9). 5 by 4 mm right upper lobe nodule on image 20 of series 8 demonstrates no significant activity but is below sensitive PET-CT size thresholds. Incidental CT findings: Stable tree-in-bud reticulonodular opacities posteriorly in the left lower lobe on image 37 of series 8 characteristic of atypical infectious bronchiolitis. Calcified granuloma in the left upper lobe. Ascending thoracic aortic aneurysm 4.1 cm in diameter, stable by my measurements. ABDOMEN/PELVIS: No significant abnormal hypermetabolic activity in this region. Incidental CT findings: Sigmoid colon diverticulosis. Previous colon wall thickening is no longer present. Sharply defined 2.9 by 2.2 cm subcutaneous cystic lesion along the left abdomen on image 105 of series 4 is  not hypermetabolic and is likely a sebaceous cyst or similar benign lesion. SKELETON: No significant abnormal hypermetabolic activity in this region. Incidental CT findings: none IMPRESSION: 1. Interval improvement with reduced size and activity of previous left thoracic adenopathy, and mild reduction in activity of the left lateral breast glandular tissues. 2. 1.0 cm left lower lobe nodule remains low in activity. Surveillance may be warranted. 3. Ascending thoracic aortic aneurysm 4.1 cm in diameter. Recommend annual imaging followup  by CTA or MRA. This recommendation follows 2010 ACCF/AHA/AATS/ACR/ASA/SCA/SCAI/SIR/STS/SVM Guidelines for the Diagnosis and Management of Patients with Thoracic Aortic Disease. Circulation. 2010; 121: R373-G681. Aortic aneurysm NOS (ICD10-I71.9) 4. Other imaging findings of potential clinical significance: Right mastoid effusion. Atypical infectious bronchiolitis in the left lower lobe, unchanged. Old granulomatous disease. Sigmoid colon diverticulosis. Electronically Signed   By: Van Clines M.D.   On: 04/06/2021 11:05   DG Chest Portable 1 View  Result Date: 04/19/2021 CLINICAL DATA:  70 year old female with history of chest pain. EXAM: PORTABLE CHEST 1 VIEW COMPARISON:  Chest x-ray 02/17/2021. FINDINGS: Right subclavian single-lumen porta cath with tip terminating in the distal superior vena cava. Small calcified granuloma in the left upper lobe. 1.1 cm nodule projecting over the left mid lung corresponding to known left lower lobe pulmonary nodule, similar to prior studies. No acute consolidative airspace disease. No pleural effusions. No pneumothorax. No evidence of pulmonary edema. Heart size is borderline enlarged. Upper mediastinal contours are within normal limits. Aortic atherosclerosis. IMPRESSION: 1. No radiographic evidence of acute cardiopulmonary disease. 2. Stable left lower lobe pulmonary nodule. 3. Aortic atherosclerosis. Electronically Signed   By: Vinnie Langton M.D.   On: 04/19/2021 07:13     ASSESSMENT:  1.  T3N3c (stage IIIc) triple negative invasive lobular carcinoma of the left breast: - She felt lump in her left breast for more than 6 months, but thought it was secondary to fibrocystic disease which she had all her life.  When she started having pain, she reached out to Dr. Sherrie Sport. - She previously had mammogram machine malfunction and had severe pain and traumatized by that experience.  She was having ultrasound of the breast every other year since then. - She was on  hormone replacement therapy for close to 10 years (from age [age]). - Ultrasound of the left breast on 01/08/2021 showed hyper vascular hypoechoic mass at 12 o'clock position measuring 3.8 x 1.3 x 2.5 cm.  There are calcifications located within the mass.  Mass extends to the level of the skin with mild associated skin thickening.  At least 3 morphologically abnormal lymph nodes in the left axilla. - Mammogram showed irregular spiculated mass with associated calcifications in the retroareolar to upper left breast measuring approximately 6 cm.  There are at least 4 morphologically abnormal lymph nodes identified in the left axilla. - Ultrasound-guided left breast and left axillary lymph node biopsy on 01/15/2021 - Pathology consistent with invasive lobular carcinoma, E-cadherin negative.  ER/PR/HER2 negative.  HER2 2+ by IHC, negative by FISH.  Ki-67 is 5%.  Lymph node core biopsy was consistent with metastatic carcinoma.  Grade 2. - PET scan on 02/03/2021 showed involvement of left supraclavicular, subpectoral, axillary lymph nodes along with the breast mass.  10 mm left lung nodule which is hypometabolic.  Spinal cord lesion at T12 level with a strong uptake. - MRI of the lumbar spine with and without contrast on 02/20/2021 showed no mass or abnormal enhancement within the canal at the T12 level to correspond to the  site of PET scan positive.  No marrow replacing bone lesion. - 2D echo on 02/21/2021 with EF 55-60%. - Weekly carboplatin and paclitaxel with every 3 weeks pembrolizumab ([id number]) started on 02/27/2021.   2.  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.  She is non-smoker. - Mother died of breast cancer.  Maternal grandmother died very young in her 83s, sister has fibrocystic disease.  Father died of lung cancer and was a smoker.   PLAN:  1.  T3N3c triple negative invasive lobular carcinoma of the left breast: -she has completed 5 weeks of  therapy. - Her white count was 1.9 yesterday.  We gave her G-CSF 480 mcg which improved her white count 9 today with normal differential. - She had mild nausea but denied any vomiting. - I have reviewed her labs today which showed normal LFTs and creatinine. - She will proceed with week 6 of treatment today with carboplatin and dose reduced paclitaxel. - She will come back next week to receive pembrolizumab with chemotherapy.  I will see her back in 2 weeks for follow-up. - We will continue to check her CBC 1 day prior to see if she needs G-CSF.  2.  Hypothyroidism: -Last TSH is 5.34.  Continue Synthroid 75 mcg daily.  3.  Chest pain: -She had some sternal pain after the G-CSF.  This resolved completely today.  4.  Hypomagnesemia: -Magnesium today is 1.7.  She does not require any supplements.  5.  Peripheral neuropathy: - She reported constant numbness in the toes since last weekend.  She also has some numbness in the left hand fingertips. - She does not report any neuropathic pains.  Will reduce paclitaxel by 20%. Orders placed this encounter:  No orders of the defined types were placed in this encounter.    Derek Jack, MD [place] [phone]   I, Thana Ates, am acting as a scribe for Dr. Derek Jack.  I, Derek Jack MD, have reviewed the above documentation for accuracy and completeness, and I agree with the above.

## 2021-04-30 NOTE — Progress Notes (Signed)
Labs drawn peripherally.    Patient tolerated injection with no complaints voiced.  Site clean and dry with no bruising or swelling noted at site.  See MAR for details.  Band aid applied.  Patient stable during and after injection.  Vss with discharge and left in satisfactory condition with no s/s of distress noted.

## 2021-05-01 ENCOUNTER — Inpatient Hospital Stay (HOSPITAL_BASED_OUTPATIENT_CLINIC_OR_DEPARTMENT_OTHER): Payer: Medicare Other | Admitting: Hematology

## 2021-05-01 ENCOUNTER — Inpatient Hospital Stay (HOSPITAL_COMMUNITY): Payer: Medicare Other

## 2021-05-01 VITALS — BP 125/74 | HR 78 | Temp 99.2°F | Resp 18 | Wt 181.3 lb

## 2021-05-01 VITALS — BP 122/74 | HR 68 | Temp 98.1°F | Resp 18

## 2021-05-01 DIAGNOSIS — D709 Neutropenia, unspecified: Secondary | ICD-10-CM | POA: Diagnosis not present

## 2021-05-01 DIAGNOSIS — C50912 Malignant neoplasm of unspecified site of left female breast: Secondary | ICD-10-CM

## 2021-05-01 DIAGNOSIS — Z5111 Encounter for antineoplastic chemotherapy: Secondary | ICD-10-CM | POA: Diagnosis not present

## 2021-05-01 DIAGNOSIS — Z95828 Presence of other vascular implants and grafts: Secondary | ICD-10-CM

## 2021-05-01 DIAGNOSIS — E039 Hypothyroidism, unspecified: Secondary | ICD-10-CM | POA: Diagnosis not present

## 2021-05-01 DIAGNOSIS — Z171 Estrogen receptor negative status [ER-]: Secondary | ICD-10-CM | POA: Diagnosis not present

## 2021-05-01 DIAGNOSIS — G620 Drug-induced polyneuropathy: Secondary | ICD-10-CM | POA: Diagnosis not present

## 2021-05-01 LAB — CBC WITH DIFFERENTIAL/PLATELET
Abs Immature Granulocytes: 0.36 10*3/uL — ABNORMAL HIGH (ref 0.00–0.07)
Basophils Absolute: 0 10*3/uL (ref 0.0–0.1)
Basophils Relative: 0 %
Eosinophils Absolute: 0.1 10*3/uL (ref 0.0–0.5)
Eosinophils Relative: 1 %
HCT: 31.1 % — ABNORMAL LOW (ref 36.0–46.0)
Hemoglobin: 10.1 g/dL — ABNORMAL LOW (ref 12.0–15.0)
Immature Granulocytes: 4 %
Lymphocytes Relative: 15 %
Lymphs Abs: 1.3 10*3/uL (ref 0.7–4.0)
MCH: 32.9 pg (ref 26.0–34.0)
MCHC: 32.5 g/dL (ref 30.0–36.0)
MCV: 101.3 fL — ABNORMAL HIGH (ref 80.0–100.0)
Monocytes Absolute: 0.6 10*3/uL (ref 0.1–1.0)
Monocytes Relative: 6 %
Neutro Abs: 6.7 10*3/uL (ref 1.7–7.7)
Neutrophils Relative %: 74 %
Platelets: 192 10*3/uL (ref 150–400)
RBC: 3.07 MIL/uL — ABNORMAL LOW (ref 3.87–5.11)
RDW: 13.5 % (ref 11.5–15.5)
WBC: 9 10*3/uL (ref 4.0–10.5)
nRBC: 0 % (ref 0.0–0.2)

## 2021-05-01 MED ORDER — SODIUM CHLORIDE 0.9 % IV SOLN
10.0000 mg | Freq: Once | INTRAVENOUS | Status: AC
  Administered 2021-05-01: 10 mg via INTRAVENOUS
  Filled 2021-05-01: qty 10

## 2021-05-01 MED ORDER — FAMOTIDINE 20 MG IN NS 100 ML IVPB
20.0000 mg | Freq: Once | INTRAVENOUS | Status: AC
  Administered 2021-05-01: 20 mg via INTRAVENOUS
  Filled 2021-05-01: qty 100

## 2021-05-01 MED ORDER — SODIUM CHLORIDE 0.9 % IV SOLN
136.8000 mg | Freq: Once | INTRAVENOUS | Status: AC
  Administered 2021-05-01: 140 mg via INTRAVENOUS
  Filled 2021-05-01: qty 14

## 2021-05-01 MED ORDER — SODIUM CHLORIDE 0.9 % IV SOLN: Freq: Once | INTRAVENOUS | Status: AC

## 2021-05-01 MED ORDER — DIPHENHYDRAMINE HCL 50 MG/ML IJ SOLN
50.0000 mg | Freq: Once | INTRAMUSCULAR | Status: AC
  Administered 2021-05-01: 50 mg via INTRAVENOUS
  Filled 2021-05-01: qty 1

## 2021-05-01 MED ORDER — HEPARIN SOD (PORK) LOCK FLUSH 100 UNIT/ML IV SOLN
500.0000 [IU] | Freq: Once | INTRAVENOUS | Status: AC | PRN
  Administered 2021-05-01: 500 [IU]

## 2021-05-01 MED ORDER — SODIUM CHLORIDE 0.9 % IV SOLN
64.0000 mg/m2 | Freq: Once | INTRAVENOUS | Status: AC
  Administered 2021-05-01: 126 mg via INTRAVENOUS
  Filled 2021-05-01: qty 21

## 2021-05-01 MED ORDER — PALONOSETRON HCL INJECTION 0.25 MG/5ML
0.2500 mg | Freq: Once | INTRAVENOUS | Status: AC
  Administered 2021-05-01: 0.25 mg via INTRAVENOUS
  Filled 2021-05-01: qty 5

## 2021-05-01 MED ORDER — SODIUM CHLORIDE 0.9% FLUSH
10.0000 mL | INTRAVENOUS | Status: DC | PRN
  Administered 2021-05-01: 10 mL

## 2021-05-01 NOTE — Progress Notes (Signed)
Patient has been assessed, vital signs and labs have been reviewed by Dr. Delton Coombes. ANC, Creatinine, LFTs, and Platelets are within treatment parameters per Dr. Delton Coombes. The patient is good to proceed with treatment at this time.  Taxol dose decreased due to numbness in fingers and toes.  Primary RN and pharmacy aware.

## 2021-05-01 NOTE — Patient Instructions (Addendum)
[facility] Discharge Instructions  You were seen today by Dr. Katragadda. He went over your recent results, and you received your treatment. Dr. Katragadda will see you back in 2 weeks for labs and follow up.   Thank you for choosing [facility] at [facility] to provide your oncology and hematology care.  To afford each patient quality time with our provider, please arrive at least 15 minutes before your scheduled appointment time.   If you have a lab appointment with the Cancer Center please come in thru the Main Entrance and check in at the main information desk  You need to re-schedule your appointment should you arrive 10 or more minutes late.  We strive to give you quality time with our providers, and arriving late affects you and other patients whose appointments are after yours.  Also, if you no show three or more times for appointments you may be dismissed from the clinic at the providers discretion.     Again, thank you for choosing [facility].  Our hope is that these requests will decrease the amount of time that you wait before being seen by our physicians.       _____________________________________________________________  Should you have questions after your visit to [facility], please contact our office at [phone] between the hours of 8:00 a.m. and 4:30 p.m.  Voicemails left after 4:00 p.m. will not be returned until the following business day.  For prescription refill requests, have your pharmacy contact our office and allow 72 hours.    Cancer Center Support Programs:   > Cancer Support Group  2nd Tuesday of the month 1pm-2pm, Journey Room   

## 2021-05-01 NOTE — Patient Instructions (Signed)
[facility]  Discharge Instructions: Thank you for choosing [facility] to provide your oncology and hematology care.  If you have a lab appointment with the [facility], please come in thru the Main Entrance and check in at the main information desk.  Wear comfortable clothing and clothing appropriate for easy access to any Portacath or PICC line.   We strive to give you quality time with your provider. You may need to reschedule your appointment if you arrive late (15 or more minutes).  Arriving late affects you and other patients whose appointments are after yours.  Also, if you miss three or more appointments without notifying the office, you may be dismissed from the clinic at the provider's discretion.      For prescription refill requests, have your pharmacy contact our office and allow 72 hours for refills to be completed.    Today you received the following chemotherapy and/or immunotherapy agents Taxol/Carbo.   To help prevent nausea and vomiting after your treatment, we encourage you to take your nausea medication as directed.  BELOW ARE SYMPTOMS THAT SHOULD BE REPORTED IMMEDIATELY: *FEVER GREATER THAN 100.4 F (38 C) OR HIGHER *CHILLS OR SWEATING *NAUSEA AND VOMITING THAT IS NOT CONTROLLED WITH YOUR NAUSEA MEDICATION *UNUSUAL SHORTNESS OF BREATH *UNUSUAL BRUISING OR BLEEDING *URINARY PROBLEMS (pain or burning when urinating, or frequent urination) *BOWEL PROBLEMS (unusual diarrhea, constipation, pain near the anus) TENDERNESS IN MOUTH AND THROAT WITH OR WITHOUT PRESENCE OF ULCERS (sore throat, sores in mouth, or a toothache) UNUSUAL RASH, SWELLING OR PAIN  UNUSUAL VAGINAL DISCHARGE OR ITCHING   Items with * indicate a potential emergency and should be followed up as soon as possible or go to the Emergency Department if any problems should occur.  Please show the CHEMOTHERAPY ALERT CARD or IMMUNOTHERAPY ALERT CARD at check-in to the Emergency  Department and triage nurse.  Should you have questions after your visit or need to cancel or reschedule your appointment, please contact [facility] [phone]  and follow the prompts.  Office hours are 8:00 a.m. to 4:30 p.m. Monday - Friday. Please note that voicemails left after 4:00 p.m. may not be returned until the following business day.  We are closed weekends and major holidays. You have access to a nurse at all times for urgent questions. Please call the main number to the clinic [phone] and follow the prompts.  For any non-urgent questions, you may also contact your provider using MyChart. We now offer e-Visits for anyone 69 and older to request care online for non-urgent symptoms. For details visit [email or web address].   Also download the MyChart app! Go to the app store, search "MyChart", open the app, select [facility], and log in with your MyChart username and password.  Due to Covid, a mask is required upon entering the hospital/clinic. If you do not have a mask, one will be given to you upon arrival. For doctor visits, patients may have 1 support person aged 1 or older with them. For treatment visits, patients cannot have anyone with them due to current Covid guidelines and our immunocompromised population.

## 2021-05-01 NOTE — Progress Notes (Signed)
Pt presents today for Taxol/Carbo per provider's prder. Vital signs stable and labs within parameters for treatment. Okay for treatment today per Tomi Myers/RN and Dr. Delton Coombes.  Taxol/Carbo given today per MD orders. Tolerated infusion without adverse affects. Vital signs stable. No complaints at this time. Discharged from clinic ambulatory in stable condition. Alert and oriented x 3. F/U with [facility] as scheduled.

## 2021-05-02 ENCOUNTER — Emergency Department (HOSPITAL_COMMUNITY)
Admission: EM | Admit: 2021-05-02 | Discharge: 2021-05-02 | Disposition: A | Discharge status: Home / Self Care | Payer: Medicare Other | Attending: Emergency Medicine | Admitting: Emergency Medicine

## 2021-05-02 ENCOUNTER — Encounter (HOSPITAL_COMMUNITY): Payer: Self-pay | Admitting: Hematology

## 2021-05-02 ENCOUNTER — Encounter (HOSPITAL_COMMUNITY): Payer: Self-pay

## 2021-05-02 ENCOUNTER — Emergency Department (HOSPITAL_COMMUNITY): Payer: Medicare Other

## 2021-05-02 ENCOUNTER — Other Ambulatory Visit: Payer: Self-pay

## 2021-05-02 DIAGNOSIS — S52502A Unspecified fracture of the lower end of left radius, initial encounter for closed fracture: Secondary | ICD-10-CM | POA: Insufficient documentation

## 2021-05-02 DIAGNOSIS — W07XXXA Fall from chair, initial encounter: Secondary | ICD-10-CM | POA: Diagnosis not present

## 2021-05-02 DIAGNOSIS — E039 Hypothyroidism, unspecified: Secondary | ICD-10-CM | POA: Diagnosis not present

## 2021-05-02 DIAGNOSIS — M25532 Pain in left wrist: Secondary | ICD-10-CM | POA: Diagnosis not present

## 2021-05-02 DIAGNOSIS — I10 Essential (primary) hypertension: Secondary | ICD-10-CM | POA: Diagnosis not present

## 2021-05-02 DIAGNOSIS — M7989 Other specified soft tissue disorders: Secondary | ICD-10-CM | POA: Diagnosis not present

## 2021-05-02 DIAGNOSIS — S6992XA Unspecified injury of left wrist, hand and finger(s), initial encounter: Secondary | ICD-10-CM | POA: Diagnosis present

## 2021-05-02 DIAGNOSIS — Z7951 Long term (current) use of inhaled steroids: Secondary | ICD-10-CM | POA: Diagnosis not present

## 2021-05-02 DIAGNOSIS — S62102A Fracture of unspecified carpal bone, left wrist, initial encounter for closed fracture: Secondary | ICD-10-CM

## 2021-05-02 DIAGNOSIS — Z79899 Other long term (current) drug therapy: Secondary | ICD-10-CM | POA: Insufficient documentation

## 2021-05-02 DIAGNOSIS — J45909 Unspecified asthma, uncomplicated: Secondary | ICD-10-CM | POA: Insufficient documentation

## 2021-05-02 DIAGNOSIS — Z853 Personal history of malignant neoplasm of breast: Secondary | ICD-10-CM | POA: Insufficient documentation

## 2021-05-02 IMAGING — DX DG WRIST COMPLETE 3+V*L*
4 series · 4 of 4 positions shown · non-contrast
Comparison: None

CLINICAL DATA: Fell today while standing on a chair, LEFT wrist
pain and swelling post fall

EXAM:
LEFT WRIST - COMPLETE 3+ VIEW

[wrist ap (1 of 2)]
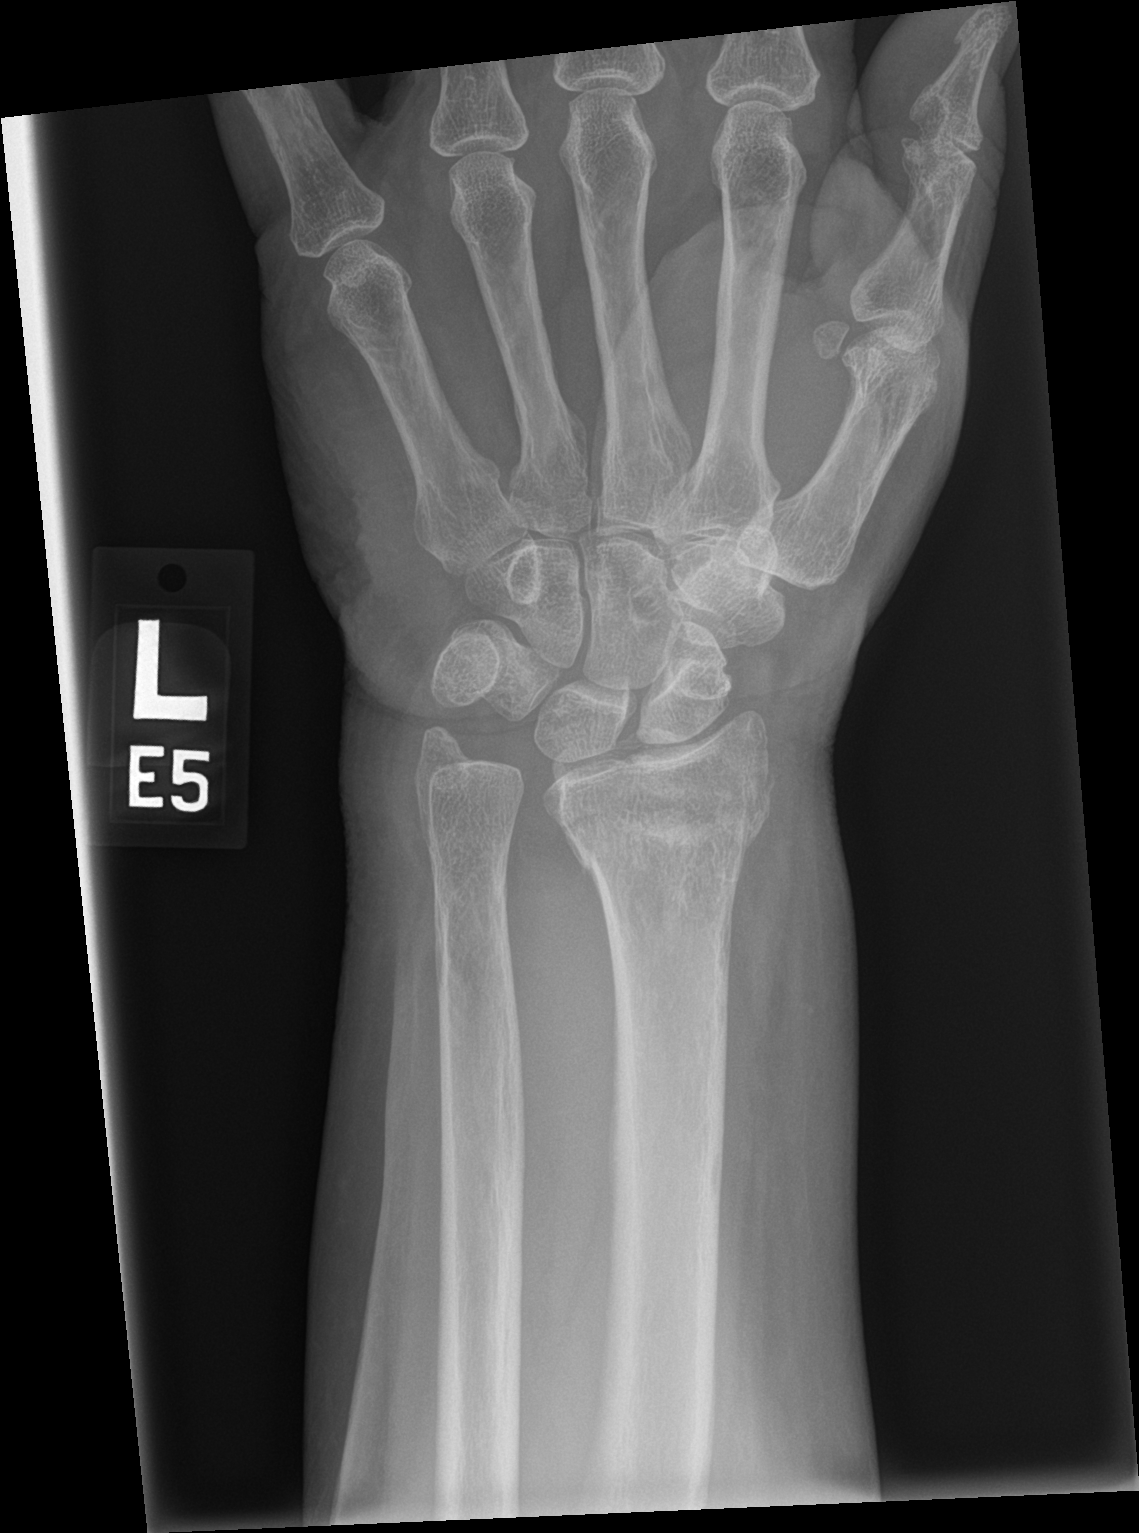

[wrist obl]
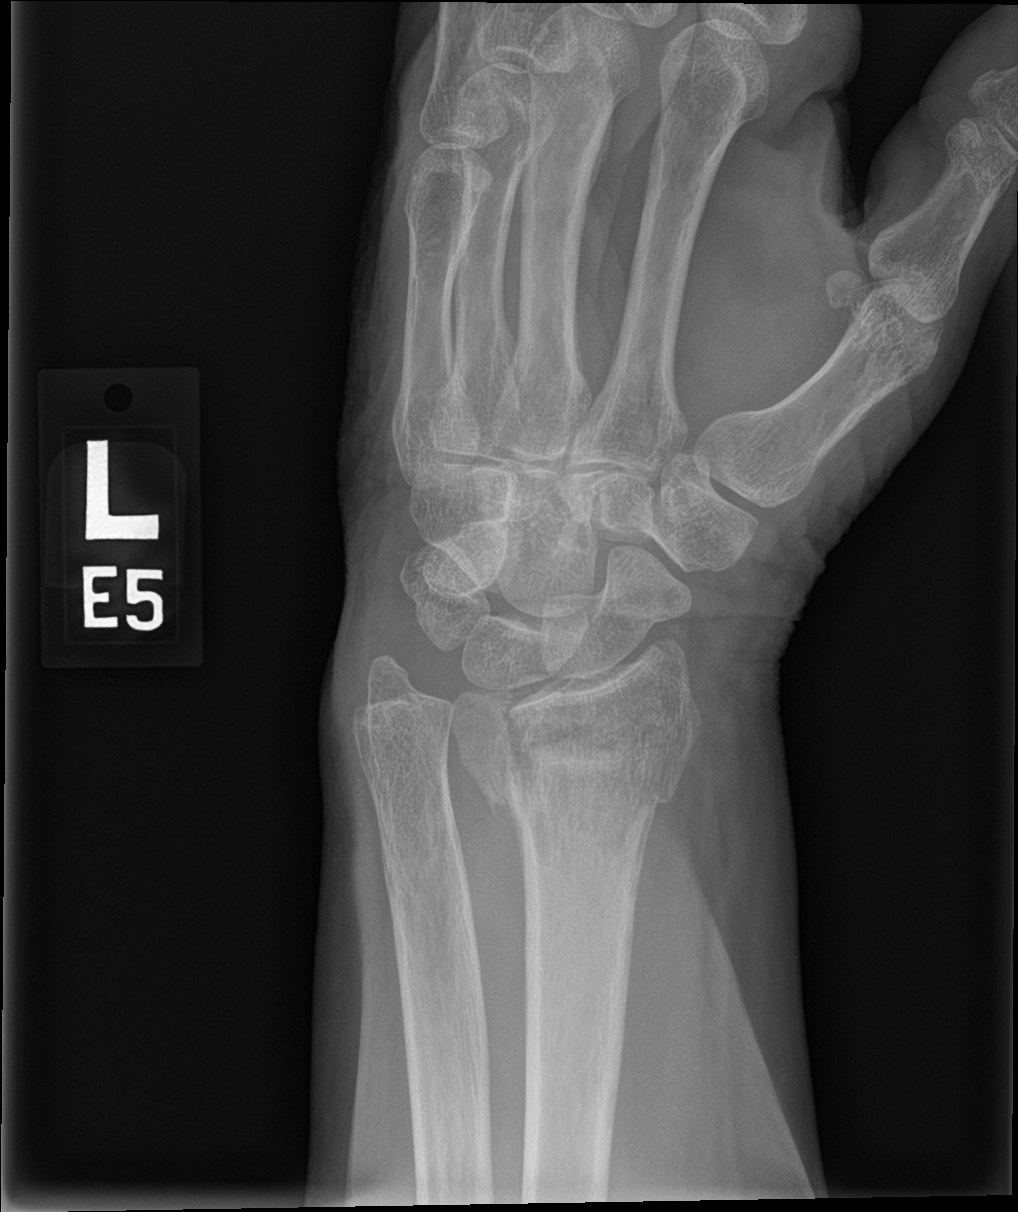

[wrist tunnel]
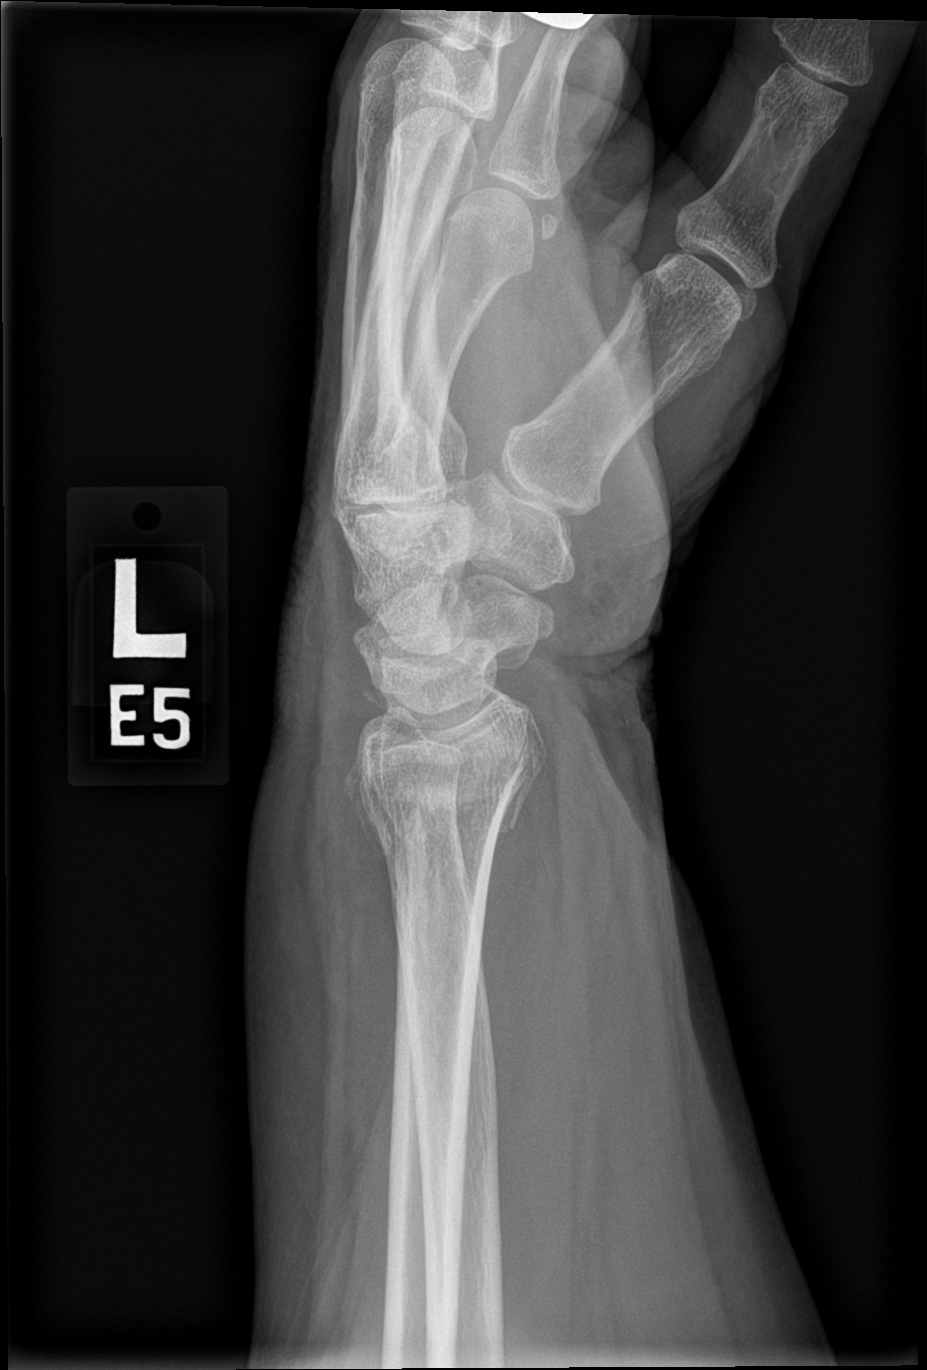

[wrist ap (2 of 2)]
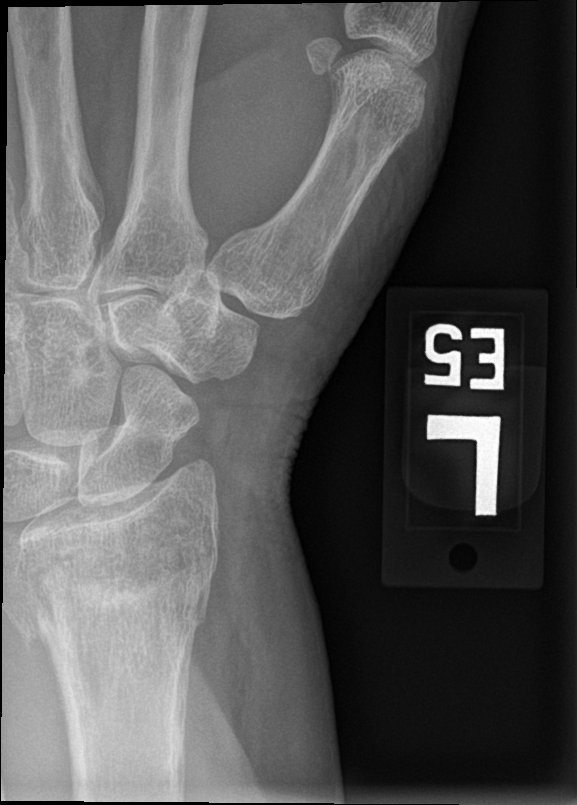

[4 of 4 positions shown; findings below may reference images not displayed]

FINDINGS: Osseous demineralization.

Upper normal diameter of scapholunate interval.

Transverse distal LEFT radial metaphyseal fracture with
intra-articular extension and dorsal tilt of distal radial articular
surface.

Ulna grossly intact.

On lateral view, a small calcific density is seen dorsal to the
radiocarpal joint, of uncertain etiology, could reflect a tiny
avulsion fracture from the dorsal margin of the lunate or
triquetrum.

No additional fracture, dislocation, or bone destruction.
IMPRESSION: Minimally angulated transverse metaphyseal fracture distal LEFT
radius with intra-articular extension at radiocarpal joint.

Tiny avulsion fragment dorsal to carpus on lateral view, without
definite identified ulnar styloid fracture, question tiny avulsion
fracture fragment arising from triquetrum or lunate.
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
## 2021-05-02 NOTE — ED Triage Notes (Signed)
Pt reports falling off chair while putting up clock and fell and injured L wrist.  Denies hitting head.  Swelling noted to L wrist.  Distal neurovascular intact.

## 2021-05-07 ENCOUNTER — Other Ambulatory Visit: Payer: Self-pay

## 2021-05-07 ENCOUNTER — Inpatient Hospital Stay (HOSPITAL_COMMUNITY): Payer: Medicare Other

## 2021-05-07 ENCOUNTER — Other Ambulatory Visit (HOSPITAL_COMMUNITY): Payer: Medicare Other

## 2021-05-07 VITALS — BP 118/65 | HR 77 | Temp 99.0°F | Resp 18

## 2021-05-07 DIAGNOSIS — G620 Drug-induced polyneuropathy: Secondary | ICD-10-CM | POA: Diagnosis not present

## 2021-05-07 DIAGNOSIS — Z171 Estrogen receptor negative status [ER-]: Secondary | ICD-10-CM | POA: Diagnosis not present

## 2021-05-07 DIAGNOSIS — C50912 Malignant neoplasm of unspecified site of left female breast: Secondary | ICD-10-CM | POA: Diagnosis not present

## 2021-05-07 DIAGNOSIS — D702 Other drug-induced agranulocytosis: Secondary | ICD-10-CM

## 2021-05-07 DIAGNOSIS — E039 Hypothyroidism, unspecified: Secondary | ICD-10-CM | POA: Diagnosis not present

## 2021-05-07 DIAGNOSIS — S52572A Other intraarticular fracture of lower end of left radius, initial encounter for closed fracture: Secondary | ICD-10-CM | POA: Diagnosis not present

## 2021-05-07 DIAGNOSIS — D709 Neutropenia, unspecified: Secondary | ICD-10-CM | POA: Diagnosis not present

## 2021-05-07 DIAGNOSIS — Z5111 Encounter for antineoplastic chemotherapy: Secondary | ICD-10-CM | POA: Diagnosis not present

## 2021-05-07 LAB — CBC WITH DIFFERENTIAL/PLATELET
Abs Immature Granulocytes: 0.02 10*3/uL (ref 0.00–0.07)
Basophils Absolute: 0 10*3/uL (ref 0.0–0.1)
Basophils Relative: 1 %
Eosinophils Absolute: 0 10*3/uL (ref 0.0–0.5)
Eosinophils Relative: 3 %
HCT: 30.2 % — ABNORMAL LOW (ref 36.0–46.0)
Hemoglobin: 10.1 g/dL — ABNORMAL LOW (ref 12.0–15.0)
Immature Granulocytes: 1 %
Lymphocytes Relative: 72 %
Lymphs Abs: 1.2 10*3/uL (ref 0.7–4.0)
MCH: 34.2 pg — ABNORMAL HIGH (ref 26.0–34.0)
MCHC: 33.4 g/dL (ref 30.0–36.0)
MCV: 102.4 fL — ABNORMAL HIGH (ref 80.0–100.0)
Monocytes Absolute: 0.1 10*3/uL (ref 0.1–1.0)
Monocytes Relative: 4 %
Neutro Abs: 0.3 10*3/uL — CL (ref 1.7–7.7)
Neutrophils Relative %: 19 %
Platelets: 269 10*3/uL (ref 150–400)
RBC: 2.95 MIL/uL — ABNORMAL LOW (ref 3.87–5.11)
RDW: 13.6 % (ref 11.5–15.5)
WBC: 1.6 10*3/uL — ABNORMAL LOW (ref 4.0–10.5)
nRBC: 0 % (ref 0.0–0.2)

## 2021-05-07 LAB — COMPREHENSIVE METABOLIC PANEL
ALT: 33 U/L (ref 0–44)
AST: 31 U/L (ref 15–41)
Albumin: 3.9 g/dL (ref 3.5–5.0)
Alkaline Phosphatase: 65 U/L (ref 38–126)
Anion gap: 5 (ref 5–15)
BUN: 20 mg/dL (ref 8–23)
CO2: 26 mmol/L (ref 22–32)
Calcium: 8.6 mg/dL — ABNORMAL LOW (ref 8.9–10.3)
Chloride: 104 mmol/L (ref 98–111)
Creatinine, Ser: 0.89 mg/dL (ref 0.44–1.00)
GFR, Estimated: >60 mL/min (ref >60)
Glucose, Bld: 111 mg/dL — ABNORMAL HIGH (ref 70–99)
Potassium: 4.1 mmol/L (ref 3.5–5.1)
Sodium: 135 mmol/L (ref 135–145)
Total Bilirubin: 0.7 mg/dL (ref 0.3–1.2)
Total Protein: 7.1 g/dL (ref 6.5–8.1)

## 2021-05-07 LAB — TSH: TSH: 3.453 u[IU]/mL (ref 0.350–4.500)

## 2021-05-07 LAB — MAGNESIUM: Magnesium: 1.6 mg/dL — ABNORMAL LOW (ref 1.7–2.4)

## 2021-05-07 MED ORDER — FILGRASTIM-SNDZ 480 MCG/0.8ML IJ SOSY
480.0000 ug | PREFILLED_SYRINGE | Freq: Once | INTRAMUSCULAR | Status: AC
  Administered 2021-05-07: 480 ug via SUBCUTANEOUS
  Filled 2021-05-07: qty 0.8

## 2021-05-07 NOTE — Progress Notes (Signed)
Annalysa Mohammad presents today for injection per the provider's orders.  Zarxio administration without incident; injection site WNL; see MAR for injection details.  Patient tolerated procedure well and without incident.  No questions or complaints noted at this time.   Zarxio given today per MD orders. Tolerated infusion without adverse affects. Vital signs stable. No complaints at this time. Discharged from clinic ambulatory in stable condition. Alert and oriented x 3. F/U with [facility] as scheduled.

## 2021-05-07 NOTE — Patient Instructions (Addendum)
[facility]  Discharge Instructions: Thank you for choosing [facility] to provide your oncology and hematology care.  If you have a lab appointment with the [facility], please come in thru the Main Entrance and check in at the main information desk.  Wear comfortable clothing and clothing appropriate for easy access to any Portacath or PICC line.   We strive to give you quality time with your provider. You may need to reschedule your appointment if you arrive late (15 or more minutes).  Arriving late affects you and other patients whose appointments are after yours.  Also, if you miss three or more appointments without notifying the office, you may be dismissed from the clinic at the provider's discretion.      For prescription refill requests, have your pharmacy contact our office and allow 72 hours for refills to be completed.    Today you received Zarxio injection.  BELOW ARE SYMPTOMS THAT SHOULD BE REPORTED IMMEDIATELY: *FEVER GREATER THAN 100.4 F (38 C) OR HIGHER *CHILLS OR SWEATING *NAUSEA AND VOMITING THAT IS NOT CONTROLLED WITH YOUR NAUSEA MEDICATION *UNUSUAL SHORTNESS OF BREATH *UNUSUAL BRUISING OR BLEEDING *URINARY PROBLEMS (pain or burning when urinating, or frequent urination) *BOWEL PROBLEMS (unusual diarrhea, constipation, pain near the anus) TENDERNESS IN MOUTH AND THROAT WITH OR WITHOUT PRESENCE OF ULCERS (sore throat, sores in mouth, or a toothache) UNUSUAL RASH, SWELLING OR PAIN  UNUSUAL VAGINAL DISCHARGE OR ITCHING   Items with * indicate a potential emergency and should be followed up as soon as possible or go to the Emergency Department if any problems should occur.  Please show the CHEMOTHERAPY ALERT CARD or IMMUNOTHERAPY ALERT CARD at check-in to the Emergency Department and triage nurse.  Should you have questions after your visit or need to cancel or reschedule your appointment, please contact [facility] [phone]   and follow the prompts.  Office hours are 8:00 a.m. to 4:30 p.m. Monday - Friday. Please note that voicemails left after 4:00 p.m. may not be returned until the following business day.  We are closed weekends and major holidays. You have access to a nurse at all times for urgent questions. Please call the main number to the clinic [phone] and follow the prompts.  For any non-urgent questions, you may also contact your provider using MyChart. We now offer e-Visits for anyone 74 and older to request care online for non-urgent symptoms. For details visit [email or web address].   Also download the MyChart app! Go to the app store, search "MyChart", open the app, select [place], and log in with your MyChart username and password.  Due to Covid, a mask is required upon entering the hospital/clinic. If you do not have a mask, one will be given to you upon arrival. For doctor visits, patients may have 1 support person aged 34 or older with them. For treatment visits, patients cannot have anyone with them due to current Covid guidelines and our immunocompromised population.

## 2021-05-08 ENCOUNTER — Inpatient Hospital Stay (HOSPITAL_COMMUNITY): Payer: Medicare Other

## 2021-05-08 VITALS — BP 113/59 | HR 80 | Temp 98.1°F | Resp 18 | Wt 180.0 lb

## 2021-05-08 DIAGNOSIS — G620 Drug-induced polyneuropathy: Secondary | ICD-10-CM | POA: Diagnosis not present

## 2021-05-08 DIAGNOSIS — C50912 Malignant neoplasm of unspecified site of left female breast: Secondary | ICD-10-CM | POA: Diagnosis not present

## 2021-05-08 DIAGNOSIS — D702 Other drug-induced agranulocytosis: Secondary | ICD-10-CM

## 2021-05-08 DIAGNOSIS — E039 Hypothyroidism, unspecified: Secondary | ICD-10-CM | POA: Diagnosis not present

## 2021-05-08 DIAGNOSIS — Z5111 Encounter for antineoplastic chemotherapy: Secondary | ICD-10-CM | POA: Diagnosis not present

## 2021-05-08 DIAGNOSIS — Z171 Estrogen receptor negative status [ER-]: Secondary | ICD-10-CM | POA: Diagnosis not present

## 2021-05-08 DIAGNOSIS — D709 Neutropenia, unspecified: Secondary | ICD-10-CM | POA: Diagnosis not present

## 2021-05-08 LAB — COMPREHENSIVE METABOLIC PANEL
ALT: 33 U/L (ref 0–44)
AST: 30 U/L (ref 15–41)
Albumin: 3.7 g/dL (ref 3.5–5.0)
Alkaline Phosphatase: 67 U/L (ref 38–126)
Anion gap: 8 (ref 5–15)
BUN: 17 mg/dL (ref 8–23)
CO2: 26 mmol/L (ref 22–32)
Calcium: 8.6 mg/dL — ABNORMAL LOW (ref 8.9–10.3)
Chloride: 103 mmol/L (ref 98–111)
Creatinine, Ser: 0.82 mg/dL (ref 0.44–1.00)
GFR, Estimated: >60 mL/min (ref >60)
Glucose, Bld: 113 mg/dL — ABNORMAL HIGH (ref 70–99)
Potassium: 3.7 mmol/L (ref 3.5–5.1)
Sodium: 137 mmol/L (ref 135–145)
Total Bilirubin: 0.8 mg/dL (ref 0.3–1.2)
Total Protein: 6.7 g/dL (ref 6.5–8.1)

## 2021-05-08 LAB — CBC WITH DIFFERENTIAL/PLATELET
Abs Immature Granulocytes: 0.04 10*3/uL (ref 0.00–0.07)
Basophils Absolute: 0 10*3/uL (ref 0.0–0.1)
Basophils Relative: 0 %
Eosinophils Absolute: 0.1 10*3/uL (ref 0.0–0.5)
Eosinophils Relative: 4 %
HCT: 30 % — ABNORMAL LOW (ref 36.0–46.0)
Hemoglobin: 9.8 g/dL — ABNORMAL LOW (ref 12.0–15.0)
Immature Granulocytes: 2 %
Lymphocytes Relative: 39 %
Lymphs Abs: 0.9 10*3/uL (ref 0.7–4.0)
MCH: 33.4 pg (ref 26.0–34.0)
MCHC: 32.7 g/dL (ref 30.0–36.0)
MCV: 102.4 fL — ABNORMAL HIGH (ref 80.0–100.0)
Monocytes Absolute: 0.1 10*3/uL (ref 0.1–1.0)
Monocytes Relative: 5 %
Neutro Abs: 1.2 10*3/uL — ABNORMAL LOW (ref 1.7–7.7)
Neutrophils Relative %: 50 %
Platelets: 246 10*3/uL (ref 150–400)
RBC: 2.93 MIL/uL — ABNORMAL LOW (ref 3.87–5.11)
RDW: 13.8 % (ref 11.5–15.5)
WBC Morphology: INCREASED
WBC: 2.3 10*3/uL — ABNORMAL LOW (ref 4.0–10.5)
nRBC: 0 % (ref 0.0–0.2)

## 2021-05-08 LAB — MAGNESIUM: Magnesium: 1.6 mg/dL — ABNORMAL LOW (ref 1.7–2.4)

## 2021-05-08 MED ORDER — SODIUM CHLORIDE 0.9% FLUSH
10.0000 mL | Freq: Once | INTRAVENOUS | Status: AC
  Administered 2021-05-08: 10 mL via INTRAVENOUS

## 2021-05-08 MED ORDER — FILGRASTIM-SNDZ 480 MCG/0.8ML IJ SOSY
480.0000 ug | PREFILLED_SYRINGE | Freq: Once | INTRAMUSCULAR | Status: AC
  Administered 2021-05-08: 480 ug via SUBCUTANEOUS
  Filled 2021-05-08: qty 0.8

## 2021-05-08 MED ORDER — HEPARIN SOD (PORK) LOCK FLUSH 100 UNIT/ML IV SOLN
500.0000 [IU] | Freq: Once | INTRAVENOUS | Status: AC
  Administered 2021-05-08: 500 [IU] via INTRAVENOUS

## 2021-05-08 MED FILL — Dexamethasone Sodium Phosphate Inj 100 MG/10ML: INTRAMUSCULAR | Qty: 1 | Status: AC

## 2021-05-08 MED FILL — Famotidine in NaCl 0.9% IV Soln 20 MG/50ML: INTRAVENOUS | Qty: 100 | Status: AC

## 2021-05-08 NOTE — Progress Notes (Signed)
Patient presents today for Keytruda, Taxol, and Carboplatin per providers order.  Vital signs within parameters for treatment.  Labs pending.  Patient has no new complaints since last visit.  Treatment being held today due to low ANC.  Patient to receive Zarxio injection today and return tomorrow for PORT flush labs and possible treatment per Dr. Delton Coombes.  Zarxio administration without incident; injection site WNL; see MAR for injection details.  Stable during injection.  Patient tolerated procedure well and without incident.  No questions or complaints noted at this time. Treatment given today per MD orders.  Discharge from clinic ambulatory in stable condition.  Alert and oriented X 3.  Follow up with [facility] as scheduled.

## 2021-05-08 NOTE — Patient Instructions (Signed)
[facility]  Discharge Instructions: Thank you for choosing [facility] to provide your oncology and hematology care.  If you have a lab appointment with the [facility], please come in thru the Main Entrance and check in at the main information desk.  Wear comfortable clothing and clothing appropriate for easy access to any Portacath or PICC line.   We strive to give you quality time with your provider. You may need to reschedule your appointment if you arrive late (15 or more minutes).  Arriving late affects you and other patients whose appointments are after yours.  Also, if you miss three or more appointments without notifying the office, you may be dismissed from the clinic at the provider's discretion.      For prescription refill requests, have your pharmacy contact our office and allow 72 hours for refills to be completed.    Today you received the following chemotherapy and/or immunotherapy agents Zarxio      To help prevent nausea and vomiting after your treatment, we encourage you to take your nausea medication as directed.  BELOW ARE SYMPTOMS THAT SHOULD BE REPORTED IMMEDIATELY: *FEVER GREATER THAN 100.4 F (38 C) OR HIGHER *CHILLS OR SWEATING *NAUSEA AND VOMITING THAT IS NOT CONTROLLED WITH YOUR NAUSEA MEDICATION *UNUSUAL SHORTNESS OF BREATH *UNUSUAL BRUISING OR BLEEDING *URINARY PROBLEMS (pain or burning when urinating, or frequent urination) *BOWEL PROBLEMS (unusual diarrhea, constipation, pain near the anus) TENDERNESS IN MOUTH AND THROAT WITH OR WITHOUT PRESENCE OF ULCERS (sore throat, sores in mouth, or a toothache) UNUSUAL RASH, SWELLING OR PAIN  UNUSUAL VAGINAL DISCHARGE OR ITCHING   Items with * indicate a potential emergency and should be followed up as soon as possible or go to the Emergency Department if any problems should occur.  Please show the CHEMOTHERAPY ALERT CARD or IMMUNOTHERAPY ALERT CARD at check-in to the Emergency  Department and triage nurse.  Should you have questions after your visit or need to cancel or reschedule your appointment, please contact [facility] [phone]  and follow the prompts.  Office hours are 8:00 a.m. to 4:30 p.m. Monday - Friday. Please note that voicemails left after 4:00 p.m. may not be returned until the following business day.  We are closed weekends and major holidays. You have access to a nurse at all times for urgent questions. Please call the main number to the clinic [phone] and follow the prompts.  For any non-urgent questions, you may also contact your provider using MyChart. We now offer e-Visits for anyone 52 and older to request care online for non-urgent symptoms. For details visit [email or web address].   Also download the MyChart app! Go to the app store, search "MyChart", open the app, select [facility], and log in with your MyChart username and password.  Due to Covid, a mask is required upon entering the hospital/clinic. If you do not have a mask, one will be given to you upon arrival. For doctor visits, patients may have 1 support person aged 48 or older with them. For treatment visits, patients cannot have anyone with them due to current Covid guidelines and our immunocompromised population.

## 2021-05-08 NOTE — Progress Notes (Signed)
Patients port flushed without difficulty.  Good blood return noted with no bruising or swelling noted at site. Stable during access and blood draw.  Patient to remain accessed for possible treatment.  

## 2021-05-09 ENCOUNTER — Inpatient Hospital Stay (HOSPITAL_COMMUNITY): Payer: Medicare Other

## 2021-05-09 ENCOUNTER — Other Ambulatory Visit: Payer: Self-pay

## 2021-05-09 DIAGNOSIS — C50912 Malignant neoplasm of unspecified site of left female breast: Secondary | ICD-10-CM

## 2021-05-09 DIAGNOSIS — Z171 Estrogen receptor negative status [ER-]: Secondary | ICD-10-CM | POA: Diagnosis not present

## 2021-05-09 DIAGNOSIS — D709 Neutropenia, unspecified: Secondary | ICD-10-CM | POA: Diagnosis not present

## 2021-05-09 DIAGNOSIS — Z5111 Encounter for antineoplastic chemotherapy: Secondary | ICD-10-CM | POA: Diagnosis not present

## 2021-05-09 DIAGNOSIS — E039 Hypothyroidism, unspecified: Secondary | ICD-10-CM | POA: Diagnosis not present

## 2021-05-09 DIAGNOSIS — G620 Drug-induced polyneuropathy: Secondary | ICD-10-CM | POA: Diagnosis not present

## 2021-05-09 LAB — CBC WITH DIFFERENTIAL/PLATELET
Abs Immature Granulocytes: 0.11 10*3/uL — ABNORMAL HIGH (ref 0.00–0.07)
Basophils Absolute: 0 10*3/uL (ref 0.0–0.1)
Basophils Relative: 1 %
Eosinophils Absolute: 0.1 10*3/uL (ref 0.0–0.5)
Eosinophils Relative: 5 %
HCT: 29.7 % — ABNORMAL LOW (ref 36.0–46.0)
Hemoglobin: 9.7 g/dL — ABNORMAL LOW (ref 12.0–15.0)
Immature Granulocytes: 4 %
Lymphocytes Relative: 45 %
Lymphs Abs: 1.3 10*3/uL (ref 0.7–4.0)
MCH: 33.7 pg (ref 26.0–34.0)
MCHC: 32.7 g/dL (ref 30.0–36.0)
MCV: 103.1 fL — ABNORMAL HIGH (ref 80.0–100.0)
Monocytes Absolute: 0.3 10*3/uL (ref 0.1–1.0)
Monocytes Relative: 9 %
Neutro Abs: 1 10*3/uL — ABNORMAL LOW (ref 1.7–7.7)
Neutrophils Relative %: 36 %
Platelets: 257 10*3/uL (ref 150–400)
RBC: 2.88 MIL/uL — ABNORMAL LOW (ref 3.87–5.11)
RDW: 14.1 % (ref 11.5–15.5)
WBC Morphology: INCREASED
WBC: 2.8 10*3/uL — ABNORMAL LOW (ref 4.0–10.5)
nRBC: 0 % (ref 0.0–0.2)

## 2021-05-09 MED ORDER — SODIUM CHLORIDE 0.9% FLUSH
10.0000 mL | Freq: Once | INTRAVENOUS | Status: AC
  Administered 2021-05-09: 10 mL via INTRAVENOUS

## 2021-05-09 MED ORDER — HEPARIN SOD (PORK) LOCK FLUSH 100 UNIT/ML IV SOLN
500.0000 [IU] | Freq: Once | INTRAVENOUS | Status: AC
  Administered 2021-05-09: 500 [IU] via INTRAVENOUS

## 2021-05-09 MED ORDER — FILGRASTIM 480 MCG/0.8ML IJ SOSY: 480.0000 ug | PREFILLED_SYRINGE | Freq: Every day | INTRAMUSCULAR | 0 refills | Status: DC | PRN

## 2021-05-09 NOTE — Progress Notes (Signed)
Patient presents today for Keytruda, Taxol, and Carboplatin infusions per providers order.  Vital signs within parameters for treatment.  ANC noted to be 1.0.  Charge nurse and Dr, Delton Coombes notified.    Per Dr. Delton Coombes treatment will be held today and patient will return Monday for Zarxio injection and possible treatment on Tuesday.  Treatment given today per MD orders.  Discharge from clinic ambulatory in stable condition.  Alert and oriented X 3.  Follow up with [facility] as scheduled.

## 2021-05-09 NOTE — Patient Instructions (Signed)
[facility]  Discharge Instructions: Thank you for choosing [facility] to provide your oncology and hematology care.  If you have a lab appointment with the [facility], please come in thru the Main Entrance and check in at the main information desk.  Wear comfortable clothing and clothing appropriate for easy access to any Portacath or PICC line.   We strive to give you quality time with your provider. You may need to reschedule your appointment if you arrive late (15 or more minutes).  Arriving late affects you and other patients whose appointments are after yours.  Also, if you miss three or more appointments without notifying the office, you may be dismissed from the clinic at the provider's discretion.      For prescription refill requests, have your pharmacy contact our office and allow 72 hours for refills to be completed.    Today treatment was held.      To help prevent nausea and vomiting after your treatment, we encourage you to take your nausea medication as directed.  BELOW ARE SYMPTOMS THAT SHOULD BE REPORTED IMMEDIATELY: *FEVER GREATER THAN 100.4 F (38 C) OR HIGHER *CHILLS OR SWEATING *NAUSEA AND VOMITING THAT IS NOT CONTROLLED WITH YOUR NAUSEA MEDICATION *UNUSUAL SHORTNESS OF BREATH *UNUSUAL BRUISING OR BLEEDING *URINARY PROBLEMS (pain or burning when urinating, or frequent urination) *BOWEL PROBLEMS (unusual diarrhea, constipation, pain near the anus) TENDERNESS IN MOUTH AND THROAT WITH OR WITHOUT PRESENCE OF ULCERS (sore throat, sores in mouth, or a toothache) UNUSUAL RASH, SWELLING OR PAIN  UNUSUAL VAGINAL DISCHARGE OR ITCHING   Items with * indicate a potential emergency and should be followed up as soon as possible or go to the Emergency Department if any problems should occur.  Please show the CHEMOTHERAPY ALERT CARD or IMMUNOTHERAPY ALERT CARD at check-in to the Emergency Department and triage nurse.  Should you have questions after  your visit or need to cancel or reschedule your appointment, please contact [facility] [phone]  and follow the prompts.  Office hours are 8:00 a.m. to 4:30 p.m. Monday - Friday. Please note that voicemails left after 4:00 p.m. may not be returned until the following business day.  We are closed weekends and major holidays. You have access to a nurse at all times for urgent questions. Please call the main number to the clinic [phone] and follow the prompts.  For any non-urgent questions, you may also contact your provider using MyChart. We now offer e-Visits for anyone 76 and older to request care online for non-urgent symptoms. For details visit [email or web address].   Also download the MyChart app! Go to the app store, search "MyChart", open the app, select [facility], and log in with your MyChart username and password.  Due to Covid, a mask is required upon entering the hospital/clinic. If you do not have a mask, one will be given to you upon arrival. For doctor visits, patients may have 1 support person aged 50 or older with them. For treatment visits, patients cannot have anyone with them due to current Covid guidelines and our immunocompromised population.

## 2021-05-09 NOTE — Progress Notes (Signed)
CRITICAL VALUE STICKER  CRITICAL VALUE:  ANC 0.3  RECEIVER (on-site recipient of call):  A. Ouida Sills, [place] NOTIFIED: 05/07/2021 at 1520  MD NOTIFIED: Delton Coombes  RESPONSE:  administer Zarxio 480 mcg

## 2021-05-12 ENCOUNTER — Inpatient Hospital Stay (HOSPITAL_COMMUNITY): Payer: Medicare Other

## 2021-05-12 ENCOUNTER — Other Ambulatory Visit: Payer: Self-pay

## 2021-05-12 DIAGNOSIS — C50912 Malignant neoplasm of unspecified site of left female breast: Secondary | ICD-10-CM | POA: Diagnosis not present

## 2021-05-12 DIAGNOSIS — E039 Hypothyroidism, unspecified: Secondary | ICD-10-CM | POA: Diagnosis not present

## 2021-05-12 DIAGNOSIS — G620 Drug-induced polyneuropathy: Secondary | ICD-10-CM | POA: Diagnosis not present

## 2021-05-12 DIAGNOSIS — Z95828 Presence of other vascular implants and grafts: Secondary | ICD-10-CM

## 2021-05-12 DIAGNOSIS — D709 Neutropenia, unspecified: Secondary | ICD-10-CM | POA: Diagnosis not present

## 2021-05-12 DIAGNOSIS — Z5111 Encounter for antineoplastic chemotherapy: Secondary | ICD-10-CM | POA: Diagnosis not present

## 2021-05-12 DIAGNOSIS — Z171 Estrogen receptor negative status [ER-]: Secondary | ICD-10-CM | POA: Diagnosis not present

## 2021-05-12 LAB — CBC WITH DIFFERENTIAL/PLATELET
Band Neutrophils: 4 %
Basophils Absolute: 0.1 10*3/uL (ref 0.0–0.1)
Basophils Relative: 1 %
Eosinophils Absolute: 0.2 10*3/uL (ref 0.0–0.5)
Eosinophils Relative: 3 %
HCT: 32.3 % — ABNORMAL LOW (ref 36.0–46.0)
Hemoglobin: 10.4 g/dL — ABNORMAL LOW (ref 12.0–15.0)
Lymphocytes Relative: 36 %
Lymphs Abs: 2.1 10*3/uL (ref 0.7–4.0)
MCH: 33.7 pg (ref 26.0–34.0)
MCHC: 32.2 g/dL (ref 30.0–36.0)
MCV: 104.5 fL — ABNORMAL HIGH (ref 80.0–100.0)
Metamyelocytes Relative: 12 %
Monocytes Absolute: 0.7 10*3/uL (ref 0.1–1.0)
Monocytes Relative: 12 %
Myelocytes: 3 %
Neutro Abs: 1.9 10*3/uL (ref 1.7–7.7)
Neutrophils Relative %: 29 %
Platelets: 282 10*3/uL (ref 150–400)
RBC: 3.09 MIL/uL — ABNORMAL LOW (ref 3.87–5.11)
RDW: 15.1 % (ref 11.5–15.5)
WBC: 5.8 10*3/uL (ref 4.0–10.5)
nRBC: 0.5 % — ABNORMAL HIGH (ref 0.0–0.2)

## 2021-05-12 NOTE — Progress Notes (Signed)
Pt here today for injection. Patient requesting to have labs today because she had another episode of chest pain with upper extremity pain, lower extremities " felt like rubber" patient stated. CBC diff drawn, will update MD.    ANC is 1.9, notified MD about above and ANC today. No shot today, will plan on labs and treatment on Thursday per MD.

## 2021-05-13 ENCOUNTER — Ambulatory Visit (HOSPITAL_COMMUNITY): Payer: Medicare Other

## 2021-05-13 ENCOUNTER — Other Ambulatory Visit (HOSPITAL_COMMUNITY): Payer: Medicare Other

## 2021-05-14 ENCOUNTER — Inpatient Hospital Stay (HOSPITAL_COMMUNITY): Payer: Medicare Other

## 2021-05-14 DIAGNOSIS — S52572A Other intraarticular fracture of lower end of left radius, initial encounter for closed fracture: Secondary | ICD-10-CM | POA: Diagnosis not present

## 2021-05-14 NOTE — Progress Notes (Signed)
Denise [name] [address], [place] [address]   Patient Care Team: Neale Burly, MD as PCP - General (Internal Medicine) Brien Mates, RN as Oncology Nurse Navigator (Oncology)  SUMMARY OF ONCOLOGIC HISTORY: Oncology History  Invasive lobular carcinoma of left breast in female [facility])  01/23/2021 Initial Diagnosis   Invasive lobular carcinoma of left breast in female [facility])   02/19/2021 Genetic Testing   Negative genetic testing on the CancerNext-Expanded+RNAinsight panel.  SMARCB1 VUS identified.  The CancerNext-Expanded gene panel offered by [facility] and includes sequencing and rearrangement analysis for the following 77 genes: AIP, ALK, APC*, ATM*, AXIN2, BAP1, BARD1, BLM, BMPR1A, BRCA1*, BRCA2*, BRIP1*, CDC73, CDH1*, CDK4, CDKN1B, CDKN2A, CHEK2*, CTNNA1, DICER1, FANCC, FH, FLCN, GALNT12, KIF1B, LZTR1, MAX, MEN1, MET, MLH1*, MSH2*, MSH3, MSH6*, MUTYH*, NBN, NF1*, NF2, NTHL1, PALB2*, PHOX2B, PMS2*, POT1, PRKAR1A, PTCH1, PTEN*, RAD51C*, RAD51D*, RB1, RECQL, RET, SDHA, SDHAF2, SDHB, SDHC, SDHD, SMAD4, SMARCA4, SMARCB1, SMARCE1, STK11, SUFU, TMEM127, TP53*, TSC1, TSC2, VHL and XRCC2 (sequencing and deletion/duplication); EGFR, EGLN1, HOXB13, KIT, MITF, PDGFRA, POLD1, and POLE (sequencing only); EPCAM and GREM1 (deletion/duplication only). DNA and RNA analyses performed for * genes. The report date is February 19, 2021.   02/27/2021 -  Chemotherapy    Patient is on Treatment Plan: BREAST PEMBROLIZUMAB + CARBOPLATIN D1,8,15+ PACLITAXEL D1,8,15 Q21D X 4 CYCLES / PEMBROLIZUMAB + AC Q21D X 4 CYCLES         CHIEF COMPLIANT: Follow-up of  left breast cancer   INTERVAL HISTORY: Denise [name] is a 70 y.o. female here today for follow up of her  left breast cancer. Her last visit was on 05/01/2021.   She is due for Cycle #3 of Keytruda, Carboplatin, & Taxol today.  Today she reports feeling well. She recently broke her left wrist in 3 places by falling off a chair  while trying to hang a clock. She denies any previous issues with balance. She is taking 3 Tylenol tablets 3-4 times daily. She reports improved intermittent numbness in her toes, and she reports she no longer has numbness in her left fingers. She denies n/v/d. On 08/26 night until 08/27 morning she experienced pain and pressure on her chest down to her elbows accompanied by weakness and instability in her legs. She is hesitant to take pain medication due to concern of side-effects.  Overall, she feels ready for her next cycle of chemotherapy today.  REVIEW OF SYSTEMS:   Review of Systems  Constitutional:  Positive for fatigue (50%). Negative for appetite change (75%).  Gastrointestinal:  Negative for diarrhea, nausea and vomiting.  Musculoskeletal:  Positive for arthralgias (3/10 L wrist broken).  Neurological:  Positive for numbness (mild; toes).  All other systems reviewed and are negative.  I have reviewed the past medical history, past surgical history, social history and family history with the patient and they are unchanged from previous note.   ALLERGIES:   is allergic to exforge [amlodipine besylate-valsartan], cheese, strawberry extract, tramadol hcl, and yeast-related products.   MEDICATIONS:  Current Outpatient Medications  Medication Sig Dispense Refill   Acetaminophen (TYLENOL PO) Take 1 tablet by mouth as needed.     albuterol (VENTOLIN HFA) 108 (90 Base) MCG/ACT inhaler Inhale into the lungs.     benazepril (LOTENSIN) 20 MG tablet Take 20 mg by mouth daily.     CARBOPLATIN IV Inject into the vein once a week.     doxepin (SINEQUAN) 10 MG capsule Take 10 mg by mouth at  bedtime.     famotidine (PEPCID) 20 MG tablet Take 1 tablet (20 mg total) by mouth daily. 30 tablet 0   filgrastim (NEUPOGEN) 480 MCG/0.8ML SOSY injection Inject 0.8 mLs (480 mcg total) into the skin daily as needed for up to 8 doses (self-administer subcutaneously as directed by Dr. Delton Coombes as needed for  neutropenia). 6.4 mL 0   gabapentin (NEURONTIN) 300 MG capsule Take 1 capsule by mouth at bedtime.     hydrochlorothiazide (MICROZIDE) 12.5 MG capsule Take 12.5 mg by mouth daily.     levothyroxine (SYNTHROID) 75 MCG tablet Take 100 mcg by mouth daily before breakfast.     lidocaine-prilocaine (EMLA) cream Apply a small amount to port a cath site and cover with plastic wrap 1 hour prior to infusion appointments 30 g 3   lisinopril (PRINIVIL,ZESTRIL) 40 MG tablet Take 40 mg by mouth daily.     lisinopril (ZESTRIL) 10 MG tablet Take by mouth.     Melatonin 5 MG TABS Take 5 mg by mouth. As needed for sleep     metoprolol succinate (TOPROL-XL) 25 MG 24 hr tablet Take by mouth.     Omega-3 Fatty Acids (FISH OIL PO) Take by mouth.     PACLITAXEL IV Inject into the vein once a week.     Pembrolizumab (KEYTRUDA IV) Inject into the vein every 21 ( twenty-one) days.     prochlorperazine (COMPAZINE) 10 MG tablet Take 1 tablet (10 mg total) by mouth every 6 (six) hours as needed (Nausea or vomiting). 30 tablet 1   psyllium (METAMUCIL) 58.6 % packet Take by mouth.     traMADol (ULTRAM) 50 MG tablet Take by mouth.     triamcinolone cream (KENALOG) 0.1 % SMARTSIG:1 Application Topical 2-3 Times Daily     No current facility-administered medications for this visit.     PHYSICAL EXAMINATION: Performance status (ECOG): 1 - Symptomatic but completely ambulatory  Vitals:   05/15/21 0849  BP: 128/71  Pulse: 81  Resp: 18  Temp: 99.2 F (37.3 C)  SpO2: 96%   Wt Readings from Last 3 Encounters:  05/15/21 179 lb 14.4 oz (81.6 kg)  05/12/21 181 lb 7 oz (82.3 kg)  05/09/21 179 lb 9.6 oz (81.5 kg)   Physical Exam Vitals reviewed.  Constitutional:      Appearance: Normal appearance.  Cardiovascular:     Rate and Rhythm: Normal rate and regular rhythm.     Pulses: Normal pulses.     Heart sounds: Normal heart sounds.  Pulmonary:     Effort: Pulmonary effort is normal.     Breath sounds: Normal  breath sounds.  Musculoskeletal:     Comments: Cast on L wrist  Neurological:     General: No focal deficit present.     Mental Status: She is alert and oriented to person, place, and time.  Psychiatric:        Mood and Affect: Mood normal.        Behavior: Behavior normal.    Breast Exam Chaperone: Thana Ates     LABORATORY DATA:  I have reviewed the data as listed CMP Latest Ref Rng & Units 05/08/2021 05/07/2021 04/30/2021  Glucose 70 - 99 mg/dL 113(H) 111(H) 99  BUN 8 - 23 mg/dL _0 Creatinine 0.44 - 1.00 mg/dL 0.82 0.89 0.77  Sodium 135 - 145 mmol/L 137 135 140  Potassium 3.5 - 5.1 mmol/L 3.7 4.1 4.2  Chloride 98 - 111 mmol/L 103 104 106  CO2 22 - 32 mmol/L _0 Calcium 8.9 - 10.3 mg/dL 8.6(L) 8.6(L) 8.6(L)  Total Protein 6.5 - 8.1 g/dL 6.7 7.1 6.6  Total Bilirubin 0.3 - 1.2 mg/dL 0.8 0.7 0.4  Alkaline Phos 38 - 126 U/L 67 65 52  AST 15 - 41 U/L _1 ALT 0 - 44 U/L 33 33 16   No results found for: HYI502 Lab Results  Component Value Date   WBC 5.8 05/12/2021   HGB 10.4 (L) 05/12/2021   HCT 32.3 (L) 05/12/2021   MCV 104.5 (H) 05/12/2021   PLT 282 05/12/2021   NEUTROABS 1.9 05/12/2021    ASSESSMENT:  1.  T3N3c (stage IIIc) triple negative invasive lobular carcinoma of the left breast: - She felt lump in her left breast for more than 6 months, but thought it was secondary to fibrocystic disease which she had all her life.  When she started having pain, she reached out to Dr. Sherrie Sport. - She previously had mammogram machine malfunction and had severe pain and traumatized by that experience.  She was having ultrasound of the breast every other year since then. - She was on hormone replacement therapy for close to 10 years (from age [age]). - Ultrasound of the left breast on 01/08/2021 showed hyper vascular hypoechoic mass at 12 o'clock position measuring 3.8 x 1.3 x 2.5 cm.  There are calcifications located within the mass.  Mass extends to the level of the  skin with mild associated skin thickening.  At least 3 morphologically abnormal lymph nodes in the left axilla. - Mammogram showed irregular spiculated mass with associated calcifications in the retroareolar to upper left breast measuring approximately 6 cm.  There are at least 4 morphologically abnormal lymph nodes identified in the left axilla. - Ultrasound-guided left breast and left axillary lymph node biopsy on 01/15/2021 - Pathology consistent with invasive lobular carcinoma, E-cadherin negative.  ER/PR/HER2 negative.  HER2 2+ by IHC, negative by FISH.  Ki-67 is 5%.  Lymph node core biopsy was consistent with metastatic carcinoma.  Grade 2. - PET scan on 02/03/2021 showed involvement of left supraclavicular, subpectoral, axillary lymph nodes along with the breast mass.  10 mm left lung nodule which is hypometabolic.  Spinal cord lesion at T12 level with a strong uptake. - MRI of the lumbar spine with and without contrast on 02/20/2021 showed no mass or abnormal enhancement within the canal at the T12 level to correspond to the site of PET scan positive.  No marrow replacing bone lesion. - 2D echo on 02/21/2021 with EF 55-60%. - Weekly carboplatin and paclitaxel with every 3 weeks pembrolizumab ([id number]) started on 02/27/2021.   2.  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  She is non-smoker. - Mother died of breast cancer.  Maternal grandmother died very young in her 41s, sister has fibrocystic disease.  Father died of lung cancer and was a smoker.   PLAN:  1.  T3N3c triple negative invasive lobular carcinoma of the left breast: - Treatment was held last week because of neutropenia.  She had to receive 2 doses of G-CSF. - Reviewed labs today which showed white count is 5.8 with ANC of 1.9.  LFTs and electrolytes are normal. - She will proceed with cycle 7 of treatment today.  We will cut back on the dose of paclitaxel by 20%.  Carboplatin dose  will be maintained. - He does not report any immunotherapy related  side effects.  We will continue monitoring her CBC and give her G-CSF as needed. - RTC 2 weeks for follow-up.  2.  Hypothyroidism: -Continue Synthroid 75 mcg daily.  Last TSH is 3.4.  3.  Chest pain: - She reported chest pain extending to the left arm on Friday night (8/26) lasted till Saturday morning.  She usually gets this pain after G-CSF injection. - I have recommended to take Claritin to see if it helps.  If it does not help, take hydrocodone 5/357m 1/2 tablet as needed.  I have sent in the prescription.  Tramadol causes her nausea.  4.  Left wrist fracture: - She sustained a left wrist fracture after a fall.  X-rays on 05/02/2021 showed minimally angulated transverse metaphyseal fracture of the distal left radius and intra-articular extension at the radiocarpal joint.  Tiny avulsion fragment dorsal to carpus on lateral view. - She is having conservative management and following up with Dr. KFredna Dowonce a week.  5.  Peripheral neuropathy: - She reports numbness in the toes on and off which has improved since we cut back on the dose of paclitaxel by 20%.  Left hand fingertip numbness also improved.  Breast Cancer therapy associated bone loss: I have recommended calcium, Vitamin D and weight bearing exercises.  Orders placed this encounter:  No orders of the defined types were placed in this encounter.   The patient has a good understanding of the overall plan. She agrees with it. She will call with any problems that may develop before the next visit here.  SDerek Jack MD [phone]  I, KThana Ates am acting as a scribe for Dr. SDerek Jack  I, SDerek JackMD, have reviewed the above documentation for accuracy and completeness, and I agree with the above.

## 2021-05-15 ENCOUNTER — Inpatient Hospital Stay (HOSPITAL_COMMUNITY): Payer: Medicare Other

## 2021-05-15 ENCOUNTER — Other Ambulatory Visit (HOSPITAL_COMMUNITY): Payer: Medicare Other

## 2021-05-15 ENCOUNTER — Inpatient Hospital Stay (HOSPITAL_COMMUNITY): Payer: Medicare Other | Attending: Hematology | Admitting: Hematology

## 2021-05-15 ENCOUNTER — Other Ambulatory Visit: Payer: Self-pay

## 2021-05-15 ENCOUNTER — Encounter (HOSPITAL_COMMUNITY): Payer: Self-pay | Admitting: Hematology

## 2021-05-15 VITALS — BP 131/77 | HR 70 | Temp 97.1°F | Resp 18

## 2021-05-15 VITALS — BP 128/71 | HR 81 | Temp 99.2°F | Resp 18 | Wt 179.9 lb

## 2021-05-15 DIAGNOSIS — Z79899 Other long term (current) drug therapy: Secondary | ICD-10-CM | POA: Insufficient documentation

## 2021-05-15 DIAGNOSIS — J45909 Unspecified asthma, uncomplicated: Secondary | ICD-10-CM | POA: Diagnosis not present

## 2021-05-15 DIAGNOSIS — Z95828 Presence of other vascular implants and grafts: Secondary | ICD-10-CM

## 2021-05-15 DIAGNOSIS — C773 Secondary and unspecified malignant neoplasm of axilla and upper limb lymph nodes: Secondary | ICD-10-CM | POA: Diagnosis not present

## 2021-05-15 DIAGNOSIS — Z5112 Encounter for antineoplastic immunotherapy: Secondary | ICD-10-CM | POA: Diagnosis not present

## 2021-05-15 DIAGNOSIS — C50912 Malignant neoplasm of unspecified site of left female breast: Secondary | ICD-10-CM | POA: Diagnosis not present

## 2021-05-15 DIAGNOSIS — J029 Acute pharyngitis, unspecified: Secondary | ICD-10-CM | POA: Insufficient documentation

## 2021-05-15 DIAGNOSIS — Z803 Family history of malignant neoplasm of breast: Secondary | ICD-10-CM | POA: Diagnosis not present

## 2021-05-15 DIAGNOSIS — Y9389 Activity, other specified: Secondary | ICD-10-CM | POA: Insufficient documentation

## 2021-05-15 DIAGNOSIS — D709 Neutropenia, unspecified: Secondary | ICD-10-CM | POA: Diagnosis not present

## 2021-05-15 DIAGNOSIS — Z801 Family history of malignant neoplasm of trachea, bronchus and lung: Secondary | ICD-10-CM | POA: Insufficient documentation

## 2021-05-15 DIAGNOSIS — S62102A Fracture of unspecified carpal bone, left wrist, initial encounter for closed fracture: Secondary | ICD-10-CM | POA: Diagnosis not present

## 2021-05-15 DIAGNOSIS — Y999 Unspecified external cause status: Secondary | ICD-10-CM | POA: Diagnosis not present

## 2021-05-15 DIAGNOSIS — R918 Other nonspecific abnormal finding of lung field: Secondary | ICD-10-CM | POA: Insufficient documentation

## 2021-05-15 DIAGNOSIS — Y92019 Unspecified place in single-family (private) house as the place of occurrence of the external cause: Secondary | ICD-10-CM | POA: Insufficient documentation

## 2021-05-15 DIAGNOSIS — I10 Essential (primary) hypertension: Secondary | ICD-10-CM | POA: Insufficient documentation

## 2021-05-15 DIAGNOSIS — R079 Chest pain, unspecified: Secondary | ICD-10-CM | POA: Diagnosis not present

## 2021-05-15 DIAGNOSIS — Z5111 Encounter for antineoplastic chemotherapy: Secondary | ICD-10-CM | POA: Diagnosis not present

## 2021-05-15 DIAGNOSIS — R197 Diarrhea, unspecified: Secondary | ICD-10-CM | POA: Insufficient documentation

## 2021-05-15 DIAGNOSIS — W07XXXA Fall from chair, initial encounter: Secondary | ICD-10-CM | POA: Insufficient documentation

## 2021-05-15 DIAGNOSIS — R519 Headache, unspecified: Secondary | ICD-10-CM | POA: Diagnosis not present

## 2021-05-15 DIAGNOSIS — Z79890 Hormone replacement therapy (postmenopausal): Secondary | ICD-10-CM | POA: Insufficient documentation

## 2021-05-15 DIAGNOSIS — R682 Dry mouth, unspecified: Secondary | ICD-10-CM | POA: Insufficient documentation

## 2021-05-15 DIAGNOSIS — E039 Hypothyroidism, unspecified: Secondary | ICD-10-CM | POA: Insufficient documentation

## 2021-05-15 DIAGNOSIS — C7951 Secondary malignant neoplasm of bone: Secondary | ICD-10-CM | POA: Insufficient documentation

## 2021-05-15 DIAGNOSIS — G629 Polyneuropathy, unspecified: Secondary | ICD-10-CM | POA: Diagnosis not present

## 2021-05-15 LAB — COMPREHENSIVE METABOLIC PANEL
ALT: 39 U/L (ref 0–44)
AST: 31 U/L (ref 15–41)
Albumin: 4 g/dL (ref 3.5–5.0)
Alkaline Phosphatase: 87 U/L (ref 38–126)
Anion gap: 5 (ref 5–15)
BUN: 17 mg/dL (ref 8–23)
CO2: 26 mmol/L (ref 22–32)
Calcium: 8.9 mg/dL (ref 8.9–10.3)
Chloride: 105 mmol/L (ref 98–111)
Creatinine, Ser: 0.85 mg/dL (ref 0.44–1.00)
GFR, Estimated: >60 mL/min (ref >60)
Glucose, Bld: 102 mg/dL — ABNORMAL HIGH (ref 70–99)
Potassium: 3.5 mmol/L (ref 3.5–5.1)
Sodium: 136 mmol/L (ref 135–145)
Total Bilirubin: 0.5 mg/dL (ref 0.3–1.2)
Total Protein: 7.5 g/dL (ref 6.5–8.1)

## 2021-05-15 LAB — MAGNESIUM: Magnesium: 1.7 mg/dL (ref 1.7–2.4)

## 2021-05-15 MED ORDER — SODIUM CHLORIDE 0.9 % IV SOLN
135.4500 mg | Freq: Once | INTRAVENOUS | Status: AC
  Administered 2021-05-15: 140 mg via INTRAVENOUS
  Filled 2021-05-15: qty 14

## 2021-05-15 MED ORDER — SODIUM CHLORIDE 0.9 % IV SOLN
200.0000 mg | Freq: Once | INTRAVENOUS | Status: AC
  Administered 2021-05-15: 200 mg via INTRAVENOUS
  Filled 2021-05-15: qty 8

## 2021-05-15 MED ORDER — PALONOSETRON HCL INJECTION 0.25 MG/5ML
0.2500 mg | Freq: Once | INTRAVENOUS | Status: AC
  Administered 2021-05-15: 0.25 mg via INTRAVENOUS
  Filled 2021-05-15: qty 5

## 2021-05-15 MED ORDER — DIPHENHYDRAMINE HCL 50 MG/ML IJ SOLN
50.0000 mg | Freq: Once | INTRAMUSCULAR | Status: AC
  Administered 2021-05-15: 50 mg via INTRAVENOUS
  Filled 2021-05-15: qty 1

## 2021-05-15 MED ORDER — HEPARIN SOD (PORK) LOCK FLUSH 100 UNIT/ML IV SOLN
500.0000 [IU] | Freq: Once | INTRAVENOUS | Status: AC | PRN
  Administered 2021-05-15: 500 [IU]

## 2021-05-15 MED ORDER — SODIUM CHLORIDE 0.9% FLUSH
10.0000 mL | INTRAVENOUS | Status: DC | PRN
  Administered 2021-05-15: 10 mL

## 2021-05-15 MED ORDER — HYDROCODONE-ACETAMINOPHEN 5-325 MG PO TABS: 1.0000 | ORAL_TABLET | Freq: Two times a day (BID) | ORAL | 0 refills | Status: DC | PRN

## 2021-05-15 MED ORDER — SODIUM CHLORIDE 0.9 % IV SOLN
64.0000 mg/m2 | Freq: Once | INTRAVENOUS | Status: AC
  Administered 2021-05-15: 126 mg via INTRAVENOUS
  Filled 2021-05-15: qty 21

## 2021-05-15 MED ORDER — FAMOTIDINE 20 MG IN NS 100 ML IVPB
20.0000 mg | Freq: Once | INTRAVENOUS | Status: AC
  Administered 2021-05-15: 20 mg via INTRAVENOUS
  Filled 2021-05-15: qty 20

## 2021-05-15 MED ORDER — SODIUM CHLORIDE 0.9 % IV SOLN
10.0000 mg | Freq: Once | INTRAVENOUS | Status: AC
  Administered 2021-05-15: 10 mg via INTRAVENOUS
  Filled 2021-05-15: qty 10

## 2021-05-15 MED ORDER — SODIUM CHLORIDE 0.9 % IV SOLN: Freq: Once | INTRAVENOUS | Status: AC

## 2021-05-15 NOTE — Progress Notes (Signed)
Patient presents today for Keytruda, Taxol, and Carboplatin per providers order.  Vitals signs and Labs within parameters for treatment.  Patient has no new complaints at this time.  Keytruda, Taxol, and Carboplatin given today per MD orders.  Stable during infusion without adverse affects.  Vital signs stable.  No complaints at this time.  Discharge from clinic ambulatory in stable condition.  Alert and oriented X 3.  Follow up with [facility] as scheduled.

## 2021-05-15 NOTE — Progress Notes (Signed)
Patient has been examined, vital signs and labs have been reviewed by Dr. Katragadda. ANC, Creatinine, LFTs, hemoglobin, and platelets are within treatment parameters per Dr. Katragadda. Patient is okay to proceed with treatment per M.D.   

## 2021-05-15 NOTE — Patient Instructions (Signed)
[facility]  Discharge Instructions: Thank you for choosing [facility] to provide your oncology and hematology care.  If you have a lab appointment with the Cancer Center, please come in thru the Main Entrance and check in at the main information desk.  Wear comfortable clothing and clothing appropriate for easy access to any Portacath or PICC line.   We strive to give you quality time with your provider. You may need to reschedule your appointment if you arrive late (15 or more minutes).  Arriving late affects you and other patients whose appointments are after yours.  Also, if you miss three or more appointments without notifying the office, you may be dismissed from the clinic at the provider's discretion.      For prescription refill requests, have your pharmacy contact our office and allow 72 hours for refills to be completed.    Today you received the following chemotherapy and/or immunotherapy agents Keytruda,Taxol, and Carboplatin.   To help prevent nausea and vomiting after your treatment, we encourage you to take your nausea medication as directed.  BELOW ARE SYMPTOMS THAT SHOULD BE REPORTED IMMEDIATELY: *FEVER GREATER THAN 100.4 F (38 C) OR HIGHER *CHILLS OR SWEATING *NAUSEA AND VOMITING THAT IS NOT CONTROLLED WITH YOUR NAUSEA MEDICATION *UNUSUAL SHORTNESS OF BREATH *UNUSUAL BRUISING OR BLEEDING *URINARY PROBLEMS (pain or burning when urinating, or frequent urination) *BOWEL PROBLEMS (unusual diarrhea, constipation, pain near the anus) TENDERNESS IN MOUTH AND THROAT WITH OR WITHOUT PRESENCE OF ULCERS (sore throat, sores in mouth, or a toothache) UNUSUAL RASH, SWELLING OR PAIN  UNUSUAL VAGINAL DISCHARGE OR ITCHING   Items with * indicate a potential emergency and should be followed up as soon as possible or go to the Emergency Department if any problems should occur.  Please show the CHEMOTHERAPY ALERT CARD or IMMUNOTHERAPY ALERT CARD at check-in to  the Emergency Department and triage nurse.  Should you have questions after your visit or need to cancel or reschedule your appointment, please contact [facility] [phone]  and follow the prompts.  Office hours are 8:00 a.m. to 4:30 p.m. Monday - Friday. Please note that voicemails left after 4:00 p.m. may not be returned until the following business day.  We are closed weekends and major holidays. You have access to a nurse at all times for urgent questions. Please call the main number to the clinic [phone] and follow the prompts.  For any non-urgent questions, you may also contact your provider using MyChart. We now offer e-Visits for anyone 18 and older to request care online for non-urgent symptoms. For details visit [email or web address].   Also download the MyChart app! Go to the app store, search "MyChart", open the app, select [facility], and log in with your MyChart username and password.  Due to Covid, a mask is required upon entering the hospital/clinic. If you do not have a mask, one will be given to you upon arrival. For doctor visits, patients may have 1 support person aged 18 or older with them. For treatment visits, patients cannot have anyone with them due to current Covid guidelines and our immunocompromised population.  

## 2021-05-15 NOTE — Patient Instructions (Addendum)
[facility] Discharge Instructions  You were seen today by Dr. Katragadda. He went over your recent results, and you received your treatment. Dr. Katragadda will see you back in 2 weeks for labs and follow up.   Thank you for choosing [facility] at [facility] to provide your oncology and hematology care.  To afford each patient quality time with our provider, please arrive at least 15 minutes before your scheduled appointment time.   If you have a lab appointment with the Cancer Center please come in thru the Main Entrance and check in at the main information desk  You need to re-schedule your appointment should you arrive 10 or more minutes late.  We strive to give you quality time with our providers, and arriving late affects you and other patients whose appointments are after yours.  Also, if you no show three or more times for appointments you may be dismissed from the clinic at the providers discretion.     Again, thank you for choosing [facility].  Our hope is that these requests will decrease the amount of time that you wait before being seen by our physicians.       _____________________________________________________________  Should you have questions after your visit to  Cancer Center, please contact our office at [phone] between the hours of 8:00 a.m. and 4:30 p.m.  Voicemails left after 4:00 p.m. will not be returned until the following business day.  For prescription refill requests, have your pharmacy contact our office and allow 72 hours.    Cancer Center Support Programs:   > Cancer Support Group  2nd Tuesday of the month 1pm-2pm, Journey Room   

## 2021-05-18 ENCOUNTER — Encounter (HOSPITAL_COMMUNITY): Payer: Self-pay | Admitting: Hematology

## 2021-05-20 ENCOUNTER — Inpatient Hospital Stay (HOSPITAL_COMMUNITY): Payer: Medicare Other

## 2021-05-20 ENCOUNTER — Other Ambulatory Visit: Payer: Self-pay

## 2021-05-20 DIAGNOSIS — C7951 Secondary malignant neoplasm of bone: Secondary | ICD-10-CM | POA: Diagnosis not present

## 2021-05-20 DIAGNOSIS — C50912 Malignant neoplasm of unspecified site of left female breast: Secondary | ICD-10-CM | POA: Diagnosis not present

## 2021-05-20 DIAGNOSIS — Z5111 Encounter for antineoplastic chemotherapy: Secondary | ICD-10-CM | POA: Diagnosis not present

## 2021-05-20 DIAGNOSIS — Z5112 Encounter for antineoplastic immunotherapy: Secondary | ICD-10-CM | POA: Diagnosis not present

## 2021-05-20 DIAGNOSIS — R918 Other nonspecific abnormal finding of lung field: Secondary | ICD-10-CM | POA: Diagnosis not present

## 2021-05-20 DIAGNOSIS — C773 Secondary and unspecified malignant neoplasm of axilla and upper limb lymph nodes: Secondary | ICD-10-CM | POA: Diagnosis not present

## 2021-05-20 LAB — CBC WITH DIFFERENTIAL/PLATELET
Abs Immature Granulocytes: 0.01 10*3/uL (ref 0.00–0.07)
Basophils Absolute: 0 10*3/uL (ref 0.0–0.1)
Basophils Relative: 1 %
Eosinophils Absolute: 0 10*3/uL (ref 0.0–0.5)
Eosinophils Relative: 1 %
HCT: 31.3 % — ABNORMAL LOW (ref 36.0–46.0)
Hemoglobin: 10.4 g/dL — ABNORMAL LOW (ref 12.0–15.0)
Immature Granulocytes: 0 %
Lymphocytes Relative: 44 %
Lymphs Abs: 1.2 10*3/uL (ref 0.7–4.0)
MCH: 34 pg (ref 26.0–34.0)
MCHC: 33.2 g/dL (ref 30.0–36.0)
MCV: 102.3 fL — ABNORMAL HIGH (ref 80.0–100.0)
Monocytes Absolute: 0.1 10*3/uL (ref 0.1–1.0)
Monocytes Relative: 5 %
Neutro Abs: 1.4 10*3/uL — ABNORMAL LOW (ref 1.7–7.7)
Neutrophils Relative %: 49 %
Platelets: 205 10*3/uL (ref 150–400)
RBC: 3.06 MIL/uL — ABNORMAL LOW (ref 3.87–5.11)
RDW: 14.6 % (ref 11.5–15.5)
WBC: 2.8 10*3/uL — ABNORMAL LOW (ref 4.0–10.5)
nRBC: 0 % (ref 0.0–0.2)

## 2021-05-21 ENCOUNTER — Inpatient Hospital Stay (HOSPITAL_COMMUNITY): Payer: Medicare Other

## 2021-05-21 DIAGNOSIS — S52572A Other intraarticular fracture of lower end of left radius, initial encounter for closed fracture: Secondary | ICD-10-CM | POA: Diagnosis not present

## 2021-05-22 ENCOUNTER — Other Ambulatory Visit: Payer: Self-pay

## 2021-05-22 ENCOUNTER — Other Ambulatory Visit (HOSPITAL_COMMUNITY): Payer: Medicare Other

## 2021-05-22 ENCOUNTER — Inpatient Hospital Stay (HOSPITAL_COMMUNITY): Payer: Medicare Other

## 2021-05-22 ENCOUNTER — Ambulatory Visit (HOSPITAL_COMMUNITY): Payer: Medicare Other

## 2021-05-22 VITALS — BP 124/74 | HR 68 | Temp 97.0°F | Resp 18

## 2021-05-22 DIAGNOSIS — C50912 Malignant neoplasm of unspecified site of left female breast: Secondary | ICD-10-CM

## 2021-05-22 DIAGNOSIS — Z95828 Presence of other vascular implants and grafts: Secondary | ICD-10-CM

## 2021-05-22 DIAGNOSIS — R918 Other nonspecific abnormal finding of lung field: Secondary | ICD-10-CM | POA: Diagnosis not present

## 2021-05-22 DIAGNOSIS — C773 Secondary and unspecified malignant neoplasm of axilla and upper limb lymph nodes: Secondary | ICD-10-CM | POA: Diagnosis not present

## 2021-05-22 DIAGNOSIS — Z5111 Encounter for antineoplastic chemotherapy: Secondary | ICD-10-CM | POA: Diagnosis not present

## 2021-05-22 DIAGNOSIS — Z5112 Encounter for antineoplastic immunotherapy: Secondary | ICD-10-CM | POA: Diagnosis not present

## 2021-05-22 DIAGNOSIS — C7951 Secondary malignant neoplasm of bone: Secondary | ICD-10-CM | POA: Diagnosis not present

## 2021-05-22 LAB — COMPREHENSIVE METABOLIC PANEL
ALT: 20 U/L (ref 0–44)
AST: 21 U/L (ref 15–41)
Albumin: 3.6 g/dL (ref 3.5–5.0)
Alkaline Phosphatase: 69 U/L (ref 38–126)
Anion gap: 6 (ref 5–15)
BUN: 20 mg/dL (ref 8–23)
CO2: 25 mmol/L (ref 22–32)
Calcium: 8.7 mg/dL — ABNORMAL LOW (ref 8.9–10.3)
Chloride: 105 mmol/L (ref 98–111)
Creatinine, Ser: 0.7 mg/dL (ref 0.44–1.00)
GFR, Estimated: >60 mL/min (ref >60)
Glucose, Bld: 104 mg/dL — ABNORMAL HIGH (ref 70–99)
Potassium: 3.8 mmol/L (ref 3.5–5.1)
Sodium: 136 mmol/L (ref 135–145)
Total Bilirubin: 0.4 mg/dL (ref 0.3–1.2)
Total Protein: 6.6 g/dL (ref 6.5–8.1)

## 2021-05-22 LAB — MAGNESIUM: Magnesium: 1.6 mg/dL — ABNORMAL LOW (ref 1.7–2.4)

## 2021-05-22 MED ORDER — SODIUM CHLORIDE 0.9 % IV SOLN
135.4500 mg | Freq: Once | INTRAVENOUS | Status: AC
  Administered 2021-05-22: 140 mg via INTRAVENOUS
  Filled 2021-05-22: qty 14

## 2021-05-22 MED ORDER — PALONOSETRON HCL INJECTION 0.25 MG/5ML
0.2500 mg | Freq: Once | INTRAVENOUS | Status: AC
  Administered 2021-05-22: 0.25 mg via INTRAVENOUS
  Filled 2021-05-22: qty 5

## 2021-05-22 MED ORDER — SODIUM CHLORIDE 0.9 % IV SOLN
64.0000 mg/m2 | Freq: Once | INTRAVENOUS | Status: AC
  Administered 2021-05-22: 126 mg via INTRAVENOUS
  Filled 2021-05-22: qty 21

## 2021-05-22 MED ORDER — DIPHENHYDRAMINE HCL 50 MG/ML IJ SOLN
50.0000 mg | Freq: Once | INTRAMUSCULAR | Status: AC
  Administered 2021-05-22: 50 mg via INTRAVENOUS
  Filled 2021-05-22: qty 1

## 2021-05-22 MED ORDER — SODIUM CHLORIDE 0.9 % IV SOLN
10.0000 mg | Freq: Once | INTRAVENOUS | Status: AC
  Administered 2021-05-22: 10 mg via INTRAVENOUS
  Filled 2021-05-22: qty 10

## 2021-05-22 MED ORDER — SODIUM CHLORIDE 0.9 % IV SOLN: Freq: Once | INTRAVENOUS | Status: AC

## 2021-05-22 MED ORDER — HEPARIN SOD (PORK) LOCK FLUSH 100 UNIT/ML IV SOLN
500.0000 [IU] | Freq: Once | INTRAVENOUS | Status: AC | PRN
  Administered 2021-05-22: 500 [IU]

## 2021-05-22 MED ORDER — SODIUM CHLORIDE 0.9% FLUSH
10.0000 mL | INTRAVENOUS | Status: DC | PRN
  Administered 2021-05-22: 10 mL

## 2021-05-22 MED ORDER — FAMOTIDINE 20 MG IN NS 100 ML IVPB
20.0000 mg | Freq: Once | INTRAVENOUS | Status: AC
  Administered 2021-05-22: 20 mg via INTRAVENOUS
  Filled 2021-05-22: qty 20

## 2021-05-22 NOTE — Progress Notes (Signed)
Patient presents today for Taxol and carboplatin infusion per providers order.  CBC drawn 05/20/21, ANC noted to be 1.4, MD aware and okay to proceed with treatment if CMP is within parameters.  Vital signs within parameters for treatment.  Patient has no new complaints at this time.  CMP within parameters for treatment.  Taxol and Carboplatin infusion without adverse affects.  Vital signs stable.  No complaints at this time.  Discharge from clinic ambulatory in stable condition.  Alert and oriented X 3.  Follow up with [facility] as scheduled.

## 2021-05-22 NOTE — Patient Instructions (Signed)
[facility]  Discharge Instructions: Thank you for choosing [facility] to provide your oncology and hematology care.  If you have a lab appointment with the Cancer Center, please come in thru the Main Entrance and check in at the main information desk.  Wear comfortable clothing and clothing appropriate for easy access to any Portacath or PICC line.   We strive to give you quality time with your provider. You may need to reschedule your appointment if you arrive late (15 or more minutes).  Arriving late affects you and other patients whose appointments are after yours.  Also, if you miss three or more appointments without notifying the office, you may be dismissed from the clinic at the provider's discretion.      For prescription refill requests, have your pharmacy contact our office and allow 72 hours for refills to be completed.    Today you received the following chemotherapy and/or immunotherapy agents Taxol and Carboplatin.   To help prevent nausea and vomiting after your treatment, we encourage you to take your nausea medication as directed.  BELOW ARE SYMPTOMS THAT SHOULD BE REPORTED IMMEDIATELY: . *FEVER GREATER THAN 100.4 F (38 C) OR HIGHER . *CHILLS OR SWEATING . *NAUSEA AND VOMITING THAT IS NOT CONTROLLED WITH YOUR NAUSEA MEDICATION . *UNUSUAL SHORTNESS OF BREATH . *UNUSUAL BRUISING OR BLEEDING . *URINARY PROBLEMS (pain or burning when urinating, or frequent urination) . *BOWEL PROBLEMS (unusual diarrhea, constipation, pain near the anus) . TENDERNESS IN MOUTH AND THROAT WITH OR WITHOUT PRESENCE OF ULCERS (sore throat, sores in mouth, or a toothache) . UNUSUAL RASH, SWELLING OR PAIN  . UNUSUAL VAGINAL DISCHARGE OR ITCHING   Items with * indicate a potential emergency and should be followed up as soon as possible or go to the Emergency Department if any problems should occur.  Please show the CHEMOTHERAPY ALERT CARD or IMMUNOTHERAPY ALERT CARD at  check-in to the Emergency Department and triage nurse.  Should you have questions after your visit or need to cancel or reschedule your appointment, please contact [facility] [phone]  and follow the prompts.  Office hours are 8:00 a.m. to 4:30 p.m. Monday - Friday. Please note that voicemails left after 4:00 p.m. may not be returned until the following business day.  We are closed weekends and major holidays. You have access to a nurse at all times for urgent questions. Please call the main number to the clinic [phone] and follow the prompts.  For any non-urgent questions, you may also contact your provider using MyChart. We now offer e-Visits for anyone 18 and older to request care online for non-urgent symptoms. For details visit [email or web address].   Also download the MyChart app! Go to the app store, search "MyChart", open the app, select [facility], and log in with your MyChart username and password.  Due to Covid, a mask is required upon entering the hospital/clinic. If you do not have a mask, one will be given to you upon arrival. For doctor visits, patients may have 1 support person aged 18 or older with them. For treatment visits, patients cannot have anyone with them due to current Covid guidelines and our immunocompromised population.  

## 2021-05-27 ENCOUNTER — Other Ambulatory Visit: Payer: Self-pay

## 2021-05-27 ENCOUNTER — Inpatient Hospital Stay (HOSPITAL_COMMUNITY): Payer: Medicare Other

## 2021-05-27 ENCOUNTER — Encounter (HOSPITAL_COMMUNITY): Payer: Self-pay

## 2021-05-27 VITALS — BP 111/59 | HR 77 | Temp 98.0°F | Resp 18

## 2021-05-27 DIAGNOSIS — Z5111 Encounter for antineoplastic chemotherapy: Secondary | ICD-10-CM | POA: Diagnosis not present

## 2021-05-27 DIAGNOSIS — Z95828 Presence of other vascular implants and grafts: Secondary | ICD-10-CM

## 2021-05-27 DIAGNOSIS — D702 Other drug-induced agranulocytosis: Secondary | ICD-10-CM

## 2021-05-27 DIAGNOSIS — C50912 Malignant neoplasm of unspecified site of left female breast: Secondary | ICD-10-CM

## 2021-05-27 DIAGNOSIS — R918 Other nonspecific abnormal finding of lung field: Secondary | ICD-10-CM | POA: Diagnosis not present

## 2021-05-27 DIAGNOSIS — C7951 Secondary malignant neoplasm of bone: Secondary | ICD-10-CM | POA: Diagnosis not present

## 2021-05-27 DIAGNOSIS — Z5112 Encounter for antineoplastic immunotherapy: Secondary | ICD-10-CM | POA: Diagnosis not present

## 2021-05-27 DIAGNOSIS — C773 Secondary and unspecified malignant neoplasm of axilla and upper limb lymph nodes: Secondary | ICD-10-CM | POA: Diagnosis not present

## 2021-05-27 LAB — CBC WITH DIFFERENTIAL/PLATELET
Basophils Absolute: 0 10*3/uL (ref 0.0–0.1)
Basophils Relative: 0 %
Eosinophils Absolute: 0 10*3/uL (ref 0.0–0.5)
Eosinophils Relative: 1 %
HCT: 30.5 % — ABNORMAL LOW (ref 36.0–46.0)
Hemoglobin: 10.1 g/dL — ABNORMAL LOW (ref 12.0–15.0)
Lymphocytes Relative: 48 %
Lymphs Abs: 1.3 10*3/uL (ref 0.7–4.0)
MCH: 33.9 pg (ref 26.0–34.0)
MCHC: 33.1 g/dL (ref 30.0–36.0)
MCV: 102.3 fL — ABNORMAL HIGH (ref 80.0–100.0)
Monocytes Absolute: 0.3 10*3/uL (ref 0.1–1.0)
Monocytes Relative: 12 %
Neutro Abs: 1.1 10*3/uL — ABNORMAL LOW (ref 1.7–7.7)
Neutrophils Relative %: 39 %
Platelets: 227 10*3/uL (ref 150–400)
RBC: 2.98 MIL/uL — ABNORMAL LOW (ref 3.87–5.11)
RDW: 14.4 % (ref 11.5–15.5)
WBC Morphology: ABNORMAL
WBC: 2.8 10*3/uL — ABNORMAL LOW (ref 4.0–10.5)
nRBC: 0 % (ref 0.0–0.2)

## 2021-05-27 LAB — COMPREHENSIVE METABOLIC PANEL
ALT: 23 U/L (ref 0–44)
AST: 24 U/L (ref 15–41)
Albumin: 3.9 g/dL (ref 3.5–5.0)
Alkaline Phosphatase: 68 U/L (ref 38–126)
Anion gap: 6 (ref 5–15)
BUN: 21 mg/dL (ref 8–23)
CO2: 26 mmol/L (ref 22–32)
Calcium: 9.1 mg/dL (ref 8.9–10.3)
Chloride: 104 mmol/L (ref 98–111)
Creatinine, Ser: 0.85 mg/dL (ref 0.44–1.00)
GFR, Estimated: >60 mL/min (ref >60)
Glucose, Bld: 108 mg/dL — ABNORMAL HIGH (ref 70–99)
Potassium: 3.9 mmol/L (ref 3.5–5.1)
Sodium: 136 mmol/L (ref 135–145)
Total Bilirubin: 0.3 mg/dL (ref 0.3–1.2)
Total Protein: 7.1 g/dL (ref 6.5–8.1)

## 2021-05-27 LAB — MAGNESIUM: Magnesium: 1.5 mg/dL — ABNORMAL LOW (ref 1.7–2.4)

## 2021-05-27 LAB — TSH: TSH: 2.427 u[IU]/mL (ref 0.350–4.500)

## 2021-05-27 MED ORDER — FILGRASTIM-SNDZ 480 MCG/0.8ML IJ SOSY
480.0000 ug | PREFILLED_SYRINGE | Freq: Once | INTRAMUSCULAR | Status: AC
  Administered 2021-05-27: 480 ug via SUBCUTANEOUS
  Filled 2021-05-27: qty 0.8

## 2021-05-27 NOTE — Patient Instructions (Signed)
[facility]  Discharge Instructions: Thank you for choosing [facility] to provide your oncology and hematology care.  If you have a lab appointment with the [facility], please come in thru the Main Entrance and check in at the main information desk.  Wear comfortable clothing and clothing appropriate for easy access to any Portacath or PICC line.   We strive to give you quality time with your provider. You may need to reschedule your appointment if you arrive late (15 or more minutes).  Arriving late affects you and other patients whose appointments are after yours.  Also, if you miss three or more appointments without notifying the office, you may be dismissed from the clinic at the provider's discretion.      For prescription refill requests, have your pharmacy contact our office and allow 72 hours for refills to be completed.    Today you received the following chemotherapy and/or immunotherapy agents: Zarxio. Return as scheduled.   To help prevent nausea and vomiting after your treatment, we encourage you to take your nausea medication as directed.  BELOW ARE SYMPTOMS THAT SHOULD BE REPORTED IMMEDIATELY: *FEVER GREATER THAN 100.4 F (38 C) OR HIGHER *CHILLS OR SWEATING *NAUSEA AND VOMITING THAT IS NOT CONTROLLED WITH YOUR NAUSEA MEDICATION *UNUSUAL SHORTNESS OF BREATH *UNUSUAL BRUISING OR BLEEDING *URINARY PROBLEMS (pain or burning when urinating, or frequent urination) *BOWEL PROBLEMS (unusual diarrhea, constipation, pain near the anus) TENDERNESS IN MOUTH AND THROAT WITH OR WITHOUT PRESENCE OF ULCERS (sore throat, sores in mouth, or a toothache) UNUSUAL RASH, SWELLING OR PAIN  UNUSUAL VAGINAL DISCHARGE OR ITCHING   Items with * indicate a potential emergency and should be followed up as soon as possible or go to the Emergency Department if any problems should occur.  Please show the CHEMOTHERAPY ALERT CARD or IMMUNOTHERAPY ALERT CARD at check-in to the  Emergency Department and triage nurse.  Should you have questions after your visit or need to cancel or reschedule your appointment, please contact [facility] [phone]  and follow the prompts.  Office hours are 8:00 a.m. to 4:30 p.m. Monday - Friday. Please note that voicemails left after 4:00 p.m. may not be returned until the following business day.  We are closed weekends and major holidays. You have access to a nurse at all times for urgent questions. Please call the main number to the clinic [phone] and follow the prompts.  For any non-urgent questions, you may also contact your provider using MyChart. We now offer e-Visits for anyone 67 and older to request care online for non-urgent symptoms. For details visit [email or web address].   Also download the MyChart app! Go to the app store, search "MyChart", open the app, select [facility], and log in with your MyChart username and password.  Due to Covid, a mask is required upon entering the hospital/clinic. If you do not have a mask, one will be given to you upon arrival. For doctor visits, patients may have 1 support person aged 48 or older with them. For treatment visits, patients cannot have anyone with them due to current Covid guidelines and our immunocompromised population.

## 2021-05-27 NOTE — Progress Notes (Signed)
Patient arrives today for lab draw, ANC 1.1. Orders received for Zarxio injection. Patient tolerated injection with no complaints voiced. Site clean and dry with no bruising or swelling noted at site. See MAR for details. Band aid applied.  Patient stable during and after injection. VSS with discharge and left in satisfactory condition with no s/s of distress noted.

## 2021-05-28 ENCOUNTER — Other Ambulatory Visit (HOSPITAL_COMMUNITY): Payer: Medicare Other

## 2021-05-28 LAB — T4: T4, Total: 7.8 ug/dL (ref 4.5–12.0)

## 2021-05-29 ENCOUNTER — Other Ambulatory Visit: Payer: Self-pay

## 2021-05-29 ENCOUNTER — Other Ambulatory Visit (HOSPITAL_COMMUNITY): Payer: Medicare Other

## 2021-05-29 ENCOUNTER — Inpatient Hospital Stay (HOSPITAL_COMMUNITY): Payer: Medicare Other

## 2021-05-29 ENCOUNTER — Inpatient Hospital Stay (HOSPITAL_BASED_OUTPATIENT_CLINIC_OR_DEPARTMENT_OTHER): Payer: Medicare Other | Admitting: Hematology

## 2021-05-29 VITALS — BP 106/67 | HR 78 | Temp 97.1°F | Resp 18

## 2021-05-29 DIAGNOSIS — Z5112 Encounter for antineoplastic immunotherapy: Secondary | ICD-10-CM | POA: Diagnosis not present

## 2021-05-29 DIAGNOSIS — C50912 Malignant neoplasm of unspecified site of left female breast: Secondary | ICD-10-CM | POA: Diagnosis not present

## 2021-05-29 DIAGNOSIS — C773 Secondary and unspecified malignant neoplasm of axilla and upper limb lymph nodes: Secondary | ICD-10-CM | POA: Diagnosis not present

## 2021-05-29 DIAGNOSIS — C7951 Secondary malignant neoplasm of bone: Secondary | ICD-10-CM | POA: Diagnosis not present

## 2021-05-29 DIAGNOSIS — R918 Other nonspecific abnormal finding of lung field: Secondary | ICD-10-CM | POA: Diagnosis not present

## 2021-05-29 DIAGNOSIS — Z5111 Encounter for antineoplastic chemotherapy: Secondary | ICD-10-CM | POA: Diagnosis not present

## 2021-05-29 DIAGNOSIS — Z95828 Presence of other vascular implants and grafts: Secondary | ICD-10-CM

## 2021-05-29 LAB — CBC WITH DIFFERENTIAL/PLATELET
Abs Immature Granulocytes: 0.6 10*3/uL — ABNORMAL HIGH (ref 0.00–0.07)
Basophils Absolute: 0 10*3/uL (ref 0.0–0.1)
Basophils Relative: 0 %
Eosinophils Absolute: 0.1 10*3/uL (ref 0.0–0.5)
Eosinophils Relative: 1 %
HCT: 27.8 % — ABNORMAL LOW (ref 36.0–46.0)
Hemoglobin: 9.1 g/dL — ABNORMAL LOW (ref 12.0–15.0)
Immature Granulocytes: 6 %
Lymphocytes Relative: 16 %
Lymphs Abs: 1.6 10*3/uL (ref 0.7–4.0)
MCH: 34.3 pg — ABNORMAL HIGH (ref 26.0–34.0)
MCHC: 32.7 g/dL (ref 30.0–36.0)
MCV: 104.9 fL — ABNORMAL HIGH (ref 80.0–100.0)
Monocytes Absolute: 0.7 10*3/uL (ref 0.1–1.0)
Monocytes Relative: 7 %
Neutro Abs: 7.2 10*3/uL (ref 1.7–7.7)
Neutrophils Relative %: 70 %
Platelets: 201 10*3/uL (ref 150–400)
RBC: 2.65 MIL/uL — ABNORMAL LOW (ref 3.87–5.11)
RDW: 15 % (ref 11.5–15.5)
WBC: 10.2 10*3/uL (ref 4.0–10.5)
nRBC: 0 % (ref 0.0–0.2)

## 2021-05-29 LAB — COMPREHENSIVE METABOLIC PANEL
ALT: 19 U/L (ref 0–44)
AST: 18 U/L (ref 15–41)
Albumin: 3.5 g/dL (ref 3.5–5.0)
Alkaline Phosphatase: 65 U/L (ref 38–126)
Anion gap: 7 (ref 5–15)
BUN: 19 mg/dL (ref 8–23)
CO2: 26 mmol/L (ref 22–32)
Calcium: 8.8 mg/dL — ABNORMAL LOW (ref 8.9–10.3)
Chloride: 105 mmol/L (ref 98–111)
Creatinine, Ser: 0.83 mg/dL (ref 0.44–1.00)
GFR, Estimated: >60 mL/min (ref >60)
Glucose, Bld: 106 mg/dL — ABNORMAL HIGH (ref 70–99)
Potassium: 3.9 mmol/L (ref 3.5–5.1)
Sodium: 138 mmol/L (ref 135–145)
Total Bilirubin: 0.1 mg/dL — ABNORMAL LOW (ref 0.3–1.2)
Total Protein: 6.4 g/dL — ABNORMAL LOW (ref 6.5–8.1)

## 2021-05-29 LAB — MAGNESIUM: Magnesium: 1.5 mg/dL — ABNORMAL LOW (ref 1.7–2.4)

## 2021-05-29 MED ORDER — SODIUM CHLORIDE 0.9 % IV SOLN: Freq: Once | INTRAVENOUS | Status: AC

## 2021-05-29 MED ORDER — SODIUM CHLORIDE 0.9% FLUSH
10.0000 mL | INTRAVENOUS | Status: DC | PRN
  Administered 2021-05-29: 10 mL

## 2021-05-29 MED ORDER — SODIUM CHLORIDE 0.9 % IV SOLN
10.0000 mg | Freq: Once | INTRAVENOUS | Status: AC
  Administered 2021-05-29: 10 mg via INTRAVENOUS
  Filled 2021-05-29: qty 10

## 2021-05-29 MED ORDER — PALONOSETRON HCL INJECTION 0.25 MG/5ML
0.2500 mg | Freq: Once | INTRAVENOUS | Status: AC
  Administered 2021-05-29: 0.25 mg via INTRAVENOUS
  Filled 2021-05-29: qty 5

## 2021-05-29 MED ORDER — MAGNESIUM SULFATE 2 GM/50ML IV SOLN
2.0000 g | Freq: Once | INTRAVENOUS | Status: AC
  Administered 2021-05-29: 2 g via INTRAVENOUS
  Filled 2021-05-29: qty 50

## 2021-05-29 MED ORDER — MAGNESIUM OXIDE -MG SUPPLEMENT 400 (240 MG) MG PO TABS: 400.0000 mg | ORAL_TABLET | Freq: Every day | ORAL | 2 refills | Status: DC

## 2021-05-29 MED ORDER — FAMOTIDINE 20 MG IN NS 100 ML IVPB
20.0000 mg | Freq: Once | INTRAVENOUS | Status: AC
  Administered 2021-05-29: 20 mg via INTRAVENOUS
  Filled 2021-05-29: qty 100

## 2021-05-29 MED ORDER — SODIUM CHLORIDE 0.9 % IV SOLN
135.4500 mg | Freq: Once | INTRAVENOUS | Status: AC
  Administered 2021-05-29: 140 mg via INTRAVENOUS
  Filled 2021-05-29: qty 14

## 2021-05-29 MED ORDER — HEPARIN SOD (PORK) LOCK FLUSH 100 UNIT/ML IV SOLN
500.0000 [IU] | Freq: Once | INTRAVENOUS | Status: AC | PRN
  Administered 2021-05-29: 500 [IU]

## 2021-05-29 MED ORDER — SODIUM CHLORIDE 0.9 % IV SOLN
64.0000 mg/m2 | Freq: Once | INTRAVENOUS | Status: AC
  Administered 2021-05-29: 126 mg via INTRAVENOUS
  Filled 2021-05-29: qty 21

## 2021-05-29 MED ORDER — DIPHENHYDRAMINE HCL 50 MG/ML IJ SOLN
50.0000 mg | Freq: Once | INTRAMUSCULAR | Status: AC
  Administered 2021-05-29: 50 mg via INTRAVENOUS
  Filled 2021-05-29: qty 1

## 2021-05-29 NOTE — Progress Notes (Signed)
Denise [name], [place] [address]   CLINIC:  Medical Oncology/Hematology  PCP:  Neale Burly, MD [place] / [place] [address] [phone]   REASON FOR VISIT:  Follow-up for left breast cancer  PRIOR THERAPY: none  NGS Results: not done  CURRENT THERAPY: Weekly carboplatin and paclitaxel with every 3 weeks pembrolizumab (keynote-522) started on 02/27/2021  BRIEF ONCOLOGIC HISTORY:  Oncology History  Invasive lobular carcinoma of left breast in female [facility])  01/23/2021 Initial Diagnosis   Invasive lobular carcinoma of left breast in female [facility])   02/19/2021 Genetic Testing   Negative genetic testing on the CancerNext-Expanded+RNAinsight panel.  SMARCB1 VUS identified.  The CancerNext-Expanded gene panel offered by [facility] and includes sequencing and rearrangement analysis for the following 77 genes: AIP, ALK, APC*, ATM*, AXIN2, BAP1, BARD1, BLM, BMPR1A, BRCA1*, BRCA2*, BRIP1*, CDC73, CDH1*, CDK4, CDKN1B, CDKN2A, CHEK2*, CTNNA1, DICER1, FANCC, FH, FLCN, GALNT12, KIF1B, LZTR1, MAX, MEN1, MET, MLH1*, MSH2*, MSH3, MSH6*, MUTYH*, NBN, NF1*, NF2, NTHL1, PALB2*, PHOX2B, PMS2*, POT1, PRKAR1A, PTCH1, PTEN*, RAD51C*, RAD51D*, RB1, RECQL, RET, SDHA, SDHAF2, SDHB, SDHC, SDHD, SMAD4, SMARCA4, SMARCB1, SMARCE1, STK11, SUFU, TMEM127, TP53*, TSC1, TSC2, VHL and XRCC2 (sequencing and deletion/duplication); EGFR, EGLN1, HOXB13, KIT, MITF, PDGFRA, POLD1, and POLE (sequencing only); EPCAM and GREM1 (deletion/duplication only). DNA and RNA analyses performed for * genes. The report date is February 19, 2021.   02/27/2021 -  Chemotherapy    Patient is on Treatment Plan: BREAST PEMBROLIZUMAB + CARBOPLATIN D1,8,15+ PACLITAXEL D1,8,15 Q21D X 4 CYCLES / PEMBROLIZUMAB + AC Q21D X 4 CYCLES         CANCER STAGING: Cancer Staging Invasive lobular carcinoma of left breast in female [facility]) Staging form: Breast, AJCC 8th Edition - Clinical stage from 01/23/2021:  cT3, cN3c, G2, ER-, PR-, HER2- - Unsigned   INTERVAL HISTORY:  [name], a 70 y.o. female, returns for routine follow-up and consideration for next cycle of chemotherapy. Lotta was last seen on 05/15/2021.  Due for day #15 cycle #3 of Carboplatin and Taxol today.   Overall, she tells me she has been feeling pretty well. She denies CP, cough, diarrhea, SOB. She reports tingling and numbness in her toes and fingers at night which is stable. She reports loss of hearing in her right ear which she reports has previously occurred associated with seasonal allergies.   Overall, she feels ready for next cycle of chemo today.   REVIEW OF SYSTEMS:  Review of Systems  Constitutional:  Negative for appetite change and fatigue (75%).  HENT:   Positive for hearing loss (R ear).   Respiratory:  Negative for cough and shortness of breath.   Cardiovascular:  Negative for chest pain and leg swelling.  Gastrointestinal:  Negative for diarrhea.  Neurological:  Positive for numbness (fingertips and toes).  All other systems reviewed and are negative.  PAST MEDICAL/SURGICAL HISTORY:  Past Medical History:  Diagnosis Date   Asthma    as child   Cancer ([place]) 12/2020   left breast IMC   Complication of anesthesia    pateitn states,' I coded when I had my D&Cmany years ago.   Family history of breast cancer    Hypertension    Hypothyroidism    PONV (postoperative nausea and vomiting)    Port-A-Cath in place 02/26/2021   Past Surgical History:  Procedure Laterality Date   ABDOMINAL HYSTERECTOMY     BIOPSY  01/17/2018   Procedure: BIOPSY;  Surgeon:  Rogene Houston, MD;  [facility];  Service: Endoscopy;;  duodenum,gastric   CHOLECYSTECTOMY     COLONOSCOPY WITH PROPOFOL N/A 01/17/2018   Procedure: COLONOSCOPY WITH PROPOFOL;  Surgeon: Rogene Houston, MD;  [facility];  Service: Endoscopy;  Laterality: N/A;  7:30   DILATION AND CURETTAGE OF UTERUS      ESOPHAGOGASTRODUODENOSCOPY (EGD) WITH PROPOFOL N/A 01/17/2018   Procedure: ESOPHAGOGASTRODUODENOSCOPY (EGD) WITH PROPOFOL;  Surgeon: Rogene Houston, MD;  [facility];  Service: Endoscopy;  Laterality: N/A;   POLYPECTOMY  01/17/2018   Procedure: POLYPECTOMY;  Surgeon: Rogene Houston, MD;  [facility];  Service: Endoscopy;;  transverse colon, cecal   PORTACATH PLACEMENT Right 02/17/2021   Procedure: INSERTION PORT-A-CATH;  Surgeon: Coralie Keens, MD;  Location: [facility];  Service: General;  Laterality: Right;    SOCIAL HISTORY:  Social History   Socioeconomic History   Marital status: Widowed    Spouse name: Not on file   Number of children: 3   Years of education: Not on file   Highest education level: Not on file  Occupational History   Occupation: [employer]   Occupation: retired  Tobacco Use   Smoking status: Never   Smokeless tobacco: Never  Scientific laboratory technician Use: Never used  Substance and Sexual Activity   Alcohol use: Never   Drug use: Never   Sexual activity: Not Currently    Birth control/protection: Surgical  Other Topics Concern   Not on file  Social History Narrative   Not on file   Social Determinants of Health   Financial Resource Strain: Low Risk    Difficulty of Paying Living Expenses: Not hard at all  Food Insecurity: No Food Insecurity   Worried About Charity fundraiser in the Last Year: Never true   [place] in the Last Year: Never true  Transportation Needs: No Transportation Needs   Lack of Transportation (Medical): No   Lack of Transportation (Non-Medical): No  Physical Activity: Insufficiently Active   Days of Exercise per Week: 3 days   Minutes of Exercise per Session: 30 min  Stress: No Stress Concern Present   Feeling of Stress : Not at all  Social Connections: Moderately Integrated   Frequency of Communication with Friends and Family: More than three times a week    Frequency of Social Gatherings with Friends and Family: More than three times a week   Attends Religious Services: More than 4 times per year   Active Member of Clubs or Organizations: No   Attends Music therapist: More than 4 times per year   Marital Status: Widowed  Human resources officer Violence: Not At Risk   Fear of Current or Ex-Partner: No   Emotionally Abused: No   Physically Abused: No   Sexually Abused: No    FAMILY HISTORY:  Family History  Problem Relation Age of Onset   Breast cancer Mother        dx in her 28s   Heart disease Mother    Thyroid disease Mother    Lung cancer Father        dx in his 30s   Thyroid disease Maternal Aunt    Thyroid nodules Maternal Grandmother 35       goiter   Thyroid disease Maternal Grandmother    Heart disease Maternal Grandfather    Heart disease Paternal Grandmother    Heart disease  Paternal Grandfather    Thyroid disease Daughter    Thyroid disease Daughter    Cancer Maternal Uncle        NOS   Cancer Paternal Uncle        NOS   Breast cancer Cousin        pat first cousin died in her 31s;     CURRENT MEDICATIONS:  Current Outpatient Medications  Medication Sig Dispense Refill   Acetaminophen (TYLENOL PO) Take 1 tablet by mouth as needed.     albuterol (VENTOLIN HFA) 108 (90 Base) MCG/ACT inhaler Inhale into the lungs.     benazepril (LOTENSIN) 20 MG tablet Take 20 mg by mouth daily.     CARBOPLATIN IV Inject into the vein once a week.     doxepin (SINEQUAN) 10 MG capsule Take 10 mg by mouth at bedtime.     famotidine (PEPCID) 20 MG tablet Take 1 tablet (20 mg total) by mouth daily. 30 tablet 0   filgrastim (NEUPOGEN) 480 MCG/0.8ML SOSY injection Inject 0.8 mLs (480 mcg total) into the skin daily as needed for up to 8 doses (self-administer subcutaneously as directed by Dr. Delton Coombes as needed for neutropenia). 6.4 mL 0   gabapentin (NEURONTIN) 300 MG capsule Take 1 capsule by mouth at bedtime.      hydrochlorothiazide (MICROZIDE) 12.5 MG capsule Take 12.5 mg by mouth daily.     HYDROcodone-acetaminophen (NORCO) 5-325 MG tablet Take 1 tablet by mouth every 12 (twelve) hours as needed for moderate pain. 20 tablet 0   levothyroxine (SYNTHROID) 75 MCG tablet Take 100 mcg by mouth daily before breakfast.     lidocaine-prilocaine (EMLA) cream Apply a small amount to port a cath site and cover with plastic wrap 1 hour prior to infusion appointments 30 g 3   lisinopril (PRINIVIL,ZESTRIL) 40 MG tablet Take 40 mg by mouth daily.     lisinopril (ZESTRIL) 10 MG tablet Take by mouth.     Melatonin 5 MG TABS Take 5 mg by mouth. As needed for sleep     metoprolol succinate (TOPROL-XL) 25 MG 24 hr tablet Take by mouth.     Omega-3 Fatty Acids (FISH OIL PO) Take by mouth.     PACLITAXEL IV Inject into the vein once a week.     Pembrolizumab (KEYTRUDA IV) Inject into the vein every 21 ( twenty-one) days.     prochlorperazine (COMPAZINE) 10 MG tablet Take 1 tablet (10 mg total) by mouth every 6 (six) hours as needed (Nausea or vomiting). 30 tablet 1   psyllium (METAMUCIL) 58.6 % packet Take by mouth.     traMADol (ULTRAM) 50 MG tablet Take by mouth.     triamcinolone cream (KENALOG) 0.1 % SMARTSIG:1 Application Topical 2-3 Times Daily     No current facility-administered medications for this visit.    ALLERGIES:  Allergies  Allergen Reactions   Exforge [Amlodipine Besylate-Valsartan] Nausea And Vomiting   Cheese    Strawberry Extract Diarrhea    Seeds,nuts, lettuce, grapes   Tramadol Hcl Nausea And Vomiting   Yeast-Related Products Hives    bread    PHYSICAL EXAM:  Performance status (ECOG): 1 - Symptomatic but completely ambulatory  There were no vitals filed for this visit. Wt Readings from Last 3 Encounters:  05/22/21 181 lb (82.1 kg)  05/15/21 179 lb 14.4 oz (81.6 kg)  05/12/21 181 lb 7 oz (82.3 kg)   Physical Exam Vitals reviewed.  Constitutional:      Appearance: Normal  appearance.  Cardiovascular:     Rate and Rhythm: Normal rate and regular rhythm.     Pulses: Normal pulses.     Heart sounds: Normal heart sounds.  Pulmonary:     Effort: Pulmonary effort is normal.     Breath sounds: Normal breath sounds.  Musculoskeletal:     Right lower leg: No edema.     Left lower leg: No edema.  Neurological:     General: No focal deficit present.     Mental Status: She is alert and oriented to person, place, and time.  Psychiatric:        Mood and Affect: Mood normal.        Behavior: Behavior normal.    LABORATORY DATA:  I have reviewed the labs as listed.  CBC Latest Ref Rng & Units 05/27/2021 05/20/2021 05/12/2021  WBC 4.0 - 10.5 K/uL 2.8(L) 2.8(L) 5.8  Hemoglobin 12.0 - 15.0 g/dL 10.1(L) 10.4(L) 10.4(L)  Hematocrit 36.0 - 46.0 % 30.5(L) 31.3(L) 32.3(L)  Platelets 150 - 400 K/uL 227 205 282   CMP Latest Ref Rng & Units 05/27/2021 05/22/2021 05/15/2021  Glucose 70 - 99 mg/dL 108(H) 104(H) 102(H)  BUN 8 - 23 mg/dL _0 Creatinine 0.44 - 1.00 mg/dL 0.85 0.70 0.85  Sodium 135 - 145 mmol/L 136 136 136  Potassium 3.5 - 5.1 mmol/L 3.9 3.8 3.5  Chloride 98 - 111 mmol/L 104 105 105  CO2 22 - 32 mmol/L _1 Calcium 8.9 - 10.3 mg/dL 9.1 8.7(L) 8.9  Total Protein 6.5 - 8.1 g/dL 7.1 6.6 7.5  Total Bilirubin 0.3 - 1.2 mg/dL 0.3 0.4 0.5  Alkaline Phos 38 - 126 U/L 68 69 87  AST 15 - 41 U/L _2 ALT 0 - 44 U/L 23 20 39    DIAGNOSTIC IMAGING:  I have independently reviewed the scans and discussed with the patient. DG Wrist Complete Left  Result Date: 05/02/2021 CLINICAL DATA:  [place] today while standing on a chair, LEFT wrist pain and swelling post fall EXAM: LEFT WRIST - COMPLETE 3+ VIEW COMPARISON:  None FINDINGS: Osseous demineralization. Upper normal diameter of scapholunate interval. Transverse distal LEFT radial metaphyseal fracture with intra-articular extension and dorsal tilt of distal radial articular surface. Ulna grossly intact. On lateral  view, a small calcific density is seen dorsal to the radiocarpal joint, of uncertain etiology, could reflect a tiny avulsion fracture from the dorsal margin of the lunate or triquetrum. No additional fracture, dislocation, or bone destruction. IMPRESSION: Minimally angulated transverse metaphyseal fracture distal LEFT radius with intra-articular extension at radiocarpal joint. Tiny avulsion fragment dorsal to carpus on lateral view, without definite identified ulnar styloid fracture, question tiny avulsion fracture fragment arising from triquetrum or lunate. Electronically Signed   By: Lavonia Dana M.D.   On: 05/02/2021 13:47     ASSESSMENT:  1.  T3N3c (stage IIIc) triple negative invasive lobular carcinoma of the left breast: - She felt lump in her left breast for more than 6 months, but thought it was secondary to fibrocystic disease which she had all her life.  When she started having pain, she reached out to Dr. Sherrie Sport. - She previously had mammogram machine malfunction and had severe pain and traumatized by that experience.  She was having ultrasound of the breast every other year since then. - She was on hormone replacement therapy for close to 10 years (from age [age]). - Ultrasound of the left breast on 01/08/2021 showed hyper  vascular hypoechoic mass at 12 o'clock position measuring 3.8 x 1.3 x 2.5 cm.  There are calcifications located within the mass.  Mass extends to the level of the skin with mild associated skin thickening.  At least 3 morphologically abnormal lymph nodes in the left axilla. - Mammogram showed irregular spiculated mass with associated calcifications in the retroareolar to upper left breast measuring approximately 6 cm.  There are at least 4 morphologically abnormal lymph nodes identified in the left axilla. - Ultrasound-guided left breast and left axillary lymph node biopsy on 01/15/2021 - Pathology consistent with invasive lobular carcinoma, E-cadherin negative.  ER/PR/HER2  negative.  HER2 2+ by IHC, negative by FISH.  Ki-67 is 5%.  Lymph node core biopsy was consistent with metastatic carcinoma.  Grade 2. - PET scan on 02/03/2021 showed involvement of left supraclavicular, subpectoral, axillary lymph nodes along with the breast mass.  10 mm left lung nodule which is hypometabolic.  Spinal cord lesion at T12 level with a strong uptake. - MRI of the lumbar spine with and without contrast on 02/20/2021 showed no mass or abnormal enhancement within the canal at the T12 level to correspond to the site of PET scan positive.  No marrow replacing bone lesion. - 2D echo on 02/21/2021 with EF 55-60%. - Weekly carboplatin and paclitaxel with every 3 weeks pembrolizumab (keynote-522) started on 02/27/2021.   2.  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  She is non-smoker. - Mother died of breast cancer.  Maternal grandmother died very young in her 55s, sister has fibrocystic disease.  Father died of lung cancer and was a smoker.   PLAN:  1.  T3N3c triple negative invasive lobular carcinoma of the left breast: -She has completed 7 weeks of carboplatin and paclitaxel along with pembrolizumab. - Her white count improved from 2.8-10.2 with G-CSF.She did not experience any side effects from G-CSF. - Reviewed labs from 05/29/2021 which showed normal LFTs.  CBC shows white count and platelet count is adequate.  Mild anemia with hemoglobin 9.1, chemotherapy-induced. - She will proceed with cycle 8 today. - RTC 2 weeks for follow-up.  We will do breast exam next time.  2.  Hypothyroidism: -Continue Synthroid 75 mcg daily.  Last TSH was 2.4.T4 was 7.8.  3.  Chest pain: -She does have pains in her chest if she receives more than 2 G-CSF injections.  She does not report any chest pain at this time.  4.  Left wrist fracture: -She sustained left wrist fracture after fall on 05/02/2021. - Continue follow-up with Dr. Fredna Dow.  Reportedly  healing well.  5.  Peripheral neuropathy: -Numbness in the toes and left hand fingertips has been stable.  No pharmacological intervention needed.  6.  Hypomagnesemia: - Magnesium has been consistently low at 1.5.  She will receive 2 g of magnesium today. - We will start her on magnesium twice daily.   Orders placed this encounter:  No orders of the defined types were placed in this encounter.    Derek Jack, MD [facility] [phone]   I, Thana Ates, am acting as a scribe for Dr. Derek Jack.  I, Derek Jack MD, have reviewed the above documentation for accuracy and completeness, and I agree with the above.

## 2021-05-29 NOTE — Patient Instructions (Addendum)
[facility] Discharge Instructions  You were seen today by Dr. Katragadda. He went over your recent results, and you received your treatment. Dr. Katragadda will see you back in 2 weeks for labs and follow up.   Thank you for choosing [facility] at [facility] to provide your oncology and hematology care.  To afford each patient quality time with our provider, please arrive at least 15 minutes before your scheduled appointment time.   If you have a lab appointment with the Cancer Center please come in thru the Main Entrance and check in at the main information desk  You need to re-schedule your appointment should you arrive 10 or more minutes late.  We strive to give you quality time with our providers, and arriving late affects you and other patients whose appointments are after yours.  Also, if you no show three or more times for appointments you may be dismissed from the clinic at the providers discretion.     Again, thank you for choosing [facility].  Our hope is that these requests will decrease the amount of time that you wait before being seen by our physicians.       _____________________________________________________________  Should you have questions after your visit to [facility], please contact our office at [phone] between the hours of 8:00 a.m. and 4:30 p.m.  Voicemails left after 4:00 p.m. will not be returned until the following business day.  For prescription refill requests, have your pharmacy contact our office and allow 72 hours.    Cancer Center Support Programs:   > Cancer Support Group  2nd Tuesday of the month 1pm-2pm, Journey Room   

## 2021-05-29 NOTE — Progress Notes (Signed)
Port flushed with good blood return noted. No bruising or swelling at site. Patient awaiting labs for treatment.  

## 2021-05-29 NOTE — Progress Notes (Signed)
Patient arrived for treatment today. Okay for treatment per Dr. Delton Coombes, received orders for 2g of Magnesium.   Patient tolerated chemotherapy and Magnesium with no complaints voiced. Side effects with management reviewed understanding verbalized. Port site clean and dry with no bruising or swelling noted at site. Good blood return noted before and after administration of chemotherapy. Band aid applied. Patient left in satisfactory condition with VSS and no s/s of distress noted.

## 2021-05-29 NOTE — Patient Instructions (Signed)
[place]  Discharge Instructions: Thank you for choosing [facility] to provide your oncology and hematology care.  If you have a lab appointment with the [facility], please come in thru the Main Entrance and check in at the main information desk.  Wear comfortable clothing and clothing appropriate for easy access to any Portacath or PICC line.   We strive to give you quality time with your provider. You may need to reschedule your appointment if you arrive late (15 or more minutes).  Arriving late affects you and other patients whose appointments are after yours.  Also, if you miss three or more appointments without notifying the office, you may be dismissed from the clinic at the provider's discretion.      For prescription refill requests, have your pharmacy contact our office and allow 72 hours for refills to be completed.    Today you received the following chemotherapy and/or immunotherapy agents Paclitaxel/Carboplatin and 2g of Magnesium. Return as scheduled.   To help prevent nausea and vomiting after your treatment, we encourage you to take your nausea medication as directed.  BELOW ARE SYMPTOMS THAT SHOULD BE REPORTED IMMEDIATELY: *FEVER GREATER THAN 100.4 F (38 C) OR HIGHER *CHILLS OR SWEATING *NAUSEA AND VOMITING THAT IS NOT CONTROLLED WITH YOUR NAUSEA MEDICATION *UNUSUAL SHORTNESS OF BREATH *UNUSUAL BRUISING OR BLEEDING *URINARY PROBLEMS (pain or burning when urinating, or frequent urination) *BOWEL PROBLEMS (unusual diarrhea, constipation, pain near the anus) TENDERNESS IN MOUTH AND THROAT WITH OR WITHOUT PRESENCE OF ULCERS (sore throat, sores in mouth, or a toothache) UNUSUAL RASH, SWELLING OR PAIN  UNUSUAL VAGINAL DISCHARGE OR ITCHING   Items with * indicate a potential emergency and should be followed up as soon as possible or go to the Emergency Department if any problems should occur.  Please show the CHEMOTHERAPY ALERT CARD or  IMMUNOTHERAPY ALERT CARD at check-in to the Emergency Department and triage nurse.  Should you have questions after your visit or need to cancel or reschedule your appointment, please contact [facility] [phone]  and follow the prompts.  Office hours are 8:00 a.m. to 4:30 p.m. Monday - Friday. Please note that voicemails left after 4:00 p.m. may not be returned until the following business day.  We are closed weekends and major holidays. You have access to a nurse at all times for urgent questions. Please call the main number to the clinic [phone] and follow the prompts.  For any non-urgent questions, you may also contact your provider using MyChart. We now offer e-Visits for anyone 74 and older to request care online for non-urgent symptoms. For details visit [email or web address].   Also download the MyChart app! Go to the app store, search "MyChart", open the app, select [facility], and log in with your MyChart username and password.  Due to Covid, a mask is required upon entering the hospital/clinic. If you do not have a mask, one will be given to you upon arrival. For doctor visits, patients may have 1 support person aged 18 or older with them. For treatment visits, patients cannot have anyone with them due to current Covid guidelines and our immunocompromised population.

## 2021-05-29 NOTE — Progress Notes (Signed)
Patient has been examined, vital signs and labs have been reviewed by Dr. Katragadda. ANC, Creatinine, LFTs, hemoglobin, and platelets are within treatment parameters per Dr. Katragadda. Patient is okay to proceed with treatment per M.D.   

## 2021-05-31 ENCOUNTER — Encounter (HOSPITAL_COMMUNITY): Payer: Self-pay | Admitting: Hematology

## 2021-06-03 ENCOUNTER — Inpatient Hospital Stay (HOSPITAL_COMMUNITY): Payer: Medicare Other

## 2021-06-03 ENCOUNTER — Other Ambulatory Visit: Payer: Self-pay

## 2021-06-03 ENCOUNTER — Encounter (HOSPITAL_COMMUNITY): Payer: Self-pay

## 2021-06-03 VITALS — BP 122/80 | HR 78 | Temp 97.9°F | Resp 18

## 2021-06-03 DIAGNOSIS — C50912 Malignant neoplasm of unspecified site of left female breast: Secondary | ICD-10-CM

## 2021-06-03 DIAGNOSIS — Z5112 Encounter for antineoplastic immunotherapy: Secondary | ICD-10-CM | POA: Diagnosis not present

## 2021-06-03 DIAGNOSIS — Z5111 Encounter for antineoplastic chemotherapy: Secondary | ICD-10-CM | POA: Diagnosis not present

## 2021-06-03 DIAGNOSIS — Z95828 Presence of other vascular implants and grafts: Secondary | ICD-10-CM

## 2021-06-03 DIAGNOSIS — C773 Secondary and unspecified malignant neoplasm of axilla and upper limb lymph nodes: Secondary | ICD-10-CM | POA: Diagnosis not present

## 2021-06-03 DIAGNOSIS — D702 Other drug-induced agranulocytosis: Secondary | ICD-10-CM

## 2021-06-03 DIAGNOSIS — R918 Other nonspecific abnormal finding of lung field: Secondary | ICD-10-CM | POA: Diagnosis not present

## 2021-06-03 DIAGNOSIS — C7951 Secondary malignant neoplasm of bone: Secondary | ICD-10-CM | POA: Diagnosis not present

## 2021-06-03 LAB — COMPREHENSIVE METABOLIC PANEL
ALT: 23 U/L (ref 0–44)
AST: 22 U/L (ref 15–41)
Albumin: 3.9 g/dL (ref 3.5–5.0)
Alkaline Phosphatase: 64 U/L (ref 38–126)
Anion gap: 4 — ABNORMAL LOW (ref 5–15)
BUN: 20 mg/dL (ref 8–23)
CO2: 26 mmol/L (ref 22–32)
Calcium: 8.5 mg/dL — ABNORMAL LOW (ref 8.9–10.3)
Chloride: 106 mmol/L (ref 98–111)
Creatinine, Ser: 0.84 mg/dL (ref 0.44–1.00)
GFR, Estimated: >60 mL/min (ref >60)
Glucose, Bld: 114 mg/dL — ABNORMAL HIGH (ref 70–99)
Potassium: 3.8 mmol/L (ref 3.5–5.1)
Sodium: 136 mmol/L (ref 135–145)
Total Bilirubin: 0.3 mg/dL (ref 0.3–1.2)
Total Protein: 7.1 g/dL (ref 6.5–8.1)

## 2021-06-03 LAB — CBC WITH DIFFERENTIAL/PLATELET
Basophils Absolute: 0 10*3/uL (ref 0.0–0.1)
Basophils Relative: 0 %
Eosinophils Absolute: 0 10*3/uL (ref 0.0–0.5)
Eosinophils Relative: 1 %
HCT: 29.8 % — ABNORMAL LOW (ref 36.0–46.0)
Hemoglobin: 10.1 g/dL — ABNORMAL LOW (ref 12.0–15.0)
Lymphocytes Relative: 45 %
Lymphs Abs: 0.9 10*3/uL (ref 0.7–4.0)
MCH: 35.7 pg — ABNORMAL HIGH (ref 26.0–34.0)
MCHC: 33.9 g/dL (ref 30.0–36.0)
MCV: 105.3 fL — ABNORMAL HIGH (ref 80.0–100.0)
Monocytes Absolute: 0.1 10*3/uL (ref 0.1–1.0)
Monocytes Relative: 7 %
Neutro Abs: 0.9 10*3/uL — ABNORMAL LOW (ref 1.7–7.7)
Neutrophils Relative %: 47 %
Platelets: 196 10*3/uL (ref 150–400)
RBC: 2.83 MIL/uL — ABNORMAL LOW (ref 3.87–5.11)
RDW: 14.7 % (ref 11.5–15.5)
WBC: 1.9 10*3/uL — ABNORMAL LOW (ref 4.0–10.5)
nRBC: 0 % (ref 0.0–0.2)

## 2021-06-03 LAB — MAGNESIUM: Magnesium: 1.8 mg/dL (ref 1.7–2.4)

## 2021-06-03 LAB — TSH: TSH: 2.261 u[IU]/mL (ref 0.350–4.500)

## 2021-06-03 MED ORDER — FILGRASTIM-SNDZ 480 MCG/0.8ML IJ SOSY
480.0000 ug | PREFILLED_SYRINGE | Freq: Once | INTRAMUSCULAR | Status: AC
  Administered 2021-06-03: 480 ug via SUBCUTANEOUS
  Filled 2021-06-03: qty 0.8

## 2021-06-03 NOTE — Progress Notes (Signed)
ANC 0.9. 480 mcg of Zarxio give [place] in the left upper arm. Vital signs stable. Patient denies pain today. Patient has complaints of constipation and states, " I think it is from the Magnesium supplements I am taking." Patient states she started Miralax today. Patient states she thinks the constipation is from her Magnesium supplements and she thought she should discontinue them per patient's words. Patient teaching performed pertaining to constipation management. MAR reviewed and updated. Understanding verbalized of when to call the clinic with concerns related to constipation.   Discharged from clinic ambulatory in stable condition. Alert and oriented x 3. F/U with [facility] as scheduled.

## 2021-06-03 NOTE — Patient Instructions (Signed)
[place]  Discharge Instructions: Thank you for choosing [facility] to provide your oncology and hematology care.  If you have a lab appointment with the [facility], please come in thru the Main Entrance and check in at the main information desk.  Wear comfortable clothing and clothing appropriate for easy access to any Portacath or PICC line.   We strive to give you quality time with your provider. You may need to reschedule your appointment if you arrive late (15 or more minutes).  Arriving late affects you and other patients whose appointments are after yours.  Also, if you miss three or more appointments without notifying the office, you may be dismissed from the clinic at the provider's discretion.      For prescription refill requests, have your pharmacy contact our office and allow 72 hours for refills to be completed.    Today you received Zarxio injection.       To help prevent nausea and vomiting after your treatment, we encourage you to take your nausea medication as directed.  BELOW ARE SYMPTOMS THAT SHOULD BE REPORTED IMMEDIATELY: *FEVER GREATER THAN 100.4 F (38 C) OR HIGHER *CHILLS OR SWEATING *NAUSEA AND VOMITING THAT IS NOT CONTROLLED WITH YOUR NAUSEA MEDICATION *UNUSUAL SHORTNESS OF BREATH *UNUSUAL BRUISING OR BLEEDING *URINARY PROBLEMS (pain or burning when urinating, or frequent urination) *BOWEL PROBLEMS (unusual diarrhea, constipation, pain near the anus) TENDERNESS IN MOUTH AND THROAT WITH OR WITHOUT PRESENCE OF ULCERS (sore throat, sores in mouth, or a toothache) UNUSUAL RASH, SWELLING OR PAIN  UNUSUAL VAGINAL DISCHARGE OR ITCHING   Items with * indicate a potential emergency and should be followed up as soon as possible or go to the Emergency Department if any problems should occur.  Please show the CHEMOTHERAPY ALERT CARD or IMMUNOTHERAPY ALERT CARD at check-in to the Emergency Department and triage nurse.  Should you have  questions after your visit or need to cancel or reschedule your appointment, please contact [facility] [phone]  and follow the prompts.  Office hours are 8:00 a.m. to 4:30 p.m. Monday - Friday. Please note that voicemails left after 4:00 p.m. may not be returned until the following business day.  We are closed weekends and major holidays. You have access to a nurse at all times for urgent questions. Please call the main number to the clinic [phone] and follow the prompts.  For any non-urgent questions, you may also contact your provider using MyChart. We now offer e-Visits for anyone 65 and older to request care online for non-urgent symptoms. For details visit [email or web address].   Also download the MyChart app! Go to the app store, search "MyChart", open the app, select [facility], and log in with your MyChart username and password.  Due to Covid, a mask is required upon entering the hospital/clinic. If you do not have a mask, one will be given to you upon arrival. For doctor visits, patients may have 1 support person aged 55 or older with them. For treatment visits, patients cannot have anyone with them due to current Covid guidelines and our immunocompromised population.

## 2021-06-04 DIAGNOSIS — S52572A Other intraarticular fracture of lower end of left radius, initial encounter for closed fracture: Secondary | ICD-10-CM | POA: Diagnosis not present

## 2021-06-04 LAB — T4: T4, Total: 6.7 ug/dL (ref 4.5–12.0)

## 2021-06-05 ENCOUNTER — Other Ambulatory Visit: Payer: Self-pay

## 2021-06-05 ENCOUNTER — Inpatient Hospital Stay (HOSPITAL_COMMUNITY): Payer: Medicare Other

## 2021-06-05 VITALS — BP 113/79 | HR 73 | Temp 99.0°F | Resp 18

## 2021-06-05 DIAGNOSIS — R918 Other nonspecific abnormal finding of lung field: Secondary | ICD-10-CM | POA: Diagnosis not present

## 2021-06-05 DIAGNOSIS — Z95828 Presence of other vascular implants and grafts: Secondary | ICD-10-CM

## 2021-06-05 DIAGNOSIS — C50912 Malignant neoplasm of unspecified site of left female breast: Secondary | ICD-10-CM | POA: Diagnosis not present

## 2021-06-05 DIAGNOSIS — C7951 Secondary malignant neoplasm of bone: Secondary | ICD-10-CM | POA: Diagnosis not present

## 2021-06-05 DIAGNOSIS — Z5111 Encounter for antineoplastic chemotherapy: Secondary | ICD-10-CM | POA: Diagnosis not present

## 2021-06-05 DIAGNOSIS — Z5112 Encounter for antineoplastic immunotherapy: Secondary | ICD-10-CM | POA: Diagnosis not present

## 2021-06-05 DIAGNOSIS — C773 Secondary and unspecified malignant neoplasm of axilla and upper limb lymph nodes: Secondary | ICD-10-CM | POA: Diagnosis not present

## 2021-06-05 LAB — CBC WITH DIFFERENTIAL/PLATELET
Basophils Absolute: 0 10*3/uL (ref 0.0–0.1)
Basophils Relative: 0 %
Eosinophils Absolute: 0 10*3/uL (ref 0.0–0.5)
Eosinophils Relative: 0 %
HCT: 28.7 % — ABNORMAL LOW (ref 36.0–46.0)
Hemoglobin: 9.4 g/dL — ABNORMAL LOW (ref 12.0–15.0)
Lymphocytes Relative: 34 %
Lymphs Abs: 2 10*3/uL (ref 0.7–4.0)
MCH: 34.3 pg — ABNORMAL HIGH (ref 26.0–34.0)
MCHC: 32.8 g/dL (ref 30.0–36.0)
MCV: 104.7 fL — ABNORMAL HIGH (ref 80.0–100.0)
Monocytes Absolute: 0.3 10*3/uL (ref 0.1–1.0)
Monocytes Relative: 5 %
Neutro Abs: 3.7 10*3/uL (ref 1.7–7.7)
Neutrophils Relative %: 61 %
Platelets: 181 10*3/uL (ref 150–400)
RBC: 2.74 MIL/uL — ABNORMAL LOW (ref 3.87–5.11)
RDW: 15.3 % (ref 11.5–15.5)
WBC: 6 10*3/uL (ref 4.0–10.5)
nRBC: 0 % (ref 0.0–0.2)

## 2021-06-05 LAB — COMPREHENSIVE METABOLIC PANEL
ALT: 21 U/L (ref 0–44)
AST: 20 U/L (ref 15–41)
Albumin: 3.7 g/dL (ref 3.5–5.0)
Alkaline Phosphatase: 64 U/L (ref 38–126)
Anion gap: 8 (ref 5–15)
BUN: 19 mg/dL (ref 8–23)
CO2: 26 mmol/L (ref 22–32)
Calcium: 8.6 mg/dL — ABNORMAL LOW (ref 8.9–10.3)
Chloride: 102 mmol/L (ref 98–111)
Creatinine, Ser: 0.77 mg/dL (ref 0.44–1.00)
GFR, Estimated: >60 mL/min (ref >60)
Glucose, Bld: 113 mg/dL — ABNORMAL HIGH (ref 70–99)
Potassium: 3.8 mmol/L (ref 3.5–5.1)
Sodium: 136 mmol/L (ref 135–145)
Total Bilirubin: 0.4 mg/dL (ref 0.3–1.2)
Total Protein: 6.7 g/dL (ref 6.5–8.1)

## 2021-06-05 LAB — MAGNESIUM: Magnesium: 1.7 mg/dL (ref 1.7–2.4)

## 2021-06-05 MED ORDER — SODIUM CHLORIDE 0.9 % IV SOLN
10.0000 mg | Freq: Once | INTRAVENOUS | Status: AC
  Administered 2021-06-05: 10 mg via INTRAVENOUS
  Filled 2021-06-05: qty 10

## 2021-06-05 MED ORDER — SODIUM CHLORIDE 0.9 % IV SOLN: Freq: Once | INTRAVENOUS | Status: AC

## 2021-06-05 MED ORDER — SODIUM CHLORIDE 0.9 % IV SOLN
200.0000 mg | Freq: Once | INTRAVENOUS | Status: AC
  Administered 2021-06-05: 200 mg via INTRAVENOUS
  Filled 2021-06-05: qty 8

## 2021-06-05 MED ORDER — DIPHENHYDRAMINE HCL 50 MG/ML IJ SOLN
50.0000 mg | Freq: Once | INTRAMUSCULAR | Status: AC
  Administered 2021-06-05: 50 mg via INTRAVENOUS
  Filled 2021-06-05: qty 1

## 2021-06-05 MED ORDER — SODIUM CHLORIDE 0.9 % IV SOLN
135.4500 mg | Freq: Once | INTRAVENOUS | Status: AC
  Administered 2021-06-05: 140 mg via INTRAVENOUS
  Filled 2021-06-05: qty 14

## 2021-06-05 MED ORDER — PALONOSETRON HCL INJECTION 0.25 MG/5ML
0.2500 mg | Freq: Once | INTRAVENOUS | Status: AC
  Administered 2021-06-05: 0.25 mg via INTRAVENOUS
  Filled 2021-06-05: qty 5

## 2021-06-05 MED ORDER — SODIUM CHLORIDE 0.9 % IV SOLN
64.0000 mg/m2 | Freq: Once | INTRAVENOUS | Status: AC
  Administered 2021-06-05: 126 mg via INTRAVENOUS
  Filled 2021-06-05: qty 21

## 2021-06-05 MED ORDER — SODIUM CHLORIDE 0.9% FLUSH
10.0000 mL | INTRAVENOUS | Status: DC | PRN
  Administered 2021-06-05: 10 mL

## 2021-06-05 MED ORDER — HEPARIN SOD (PORK) LOCK FLUSH 100 UNIT/ML IV SOLN
500.0000 [IU] | Freq: Once | INTRAVENOUS | Status: AC | PRN
  Administered 2021-06-05: 500 [IU]

## 2021-06-05 MED ORDER — FAMOTIDINE 20 MG IN NS 100 ML IVPB
20.0000 mg | Freq: Once | INTRAVENOUS | Status: AC
  Administered 2021-06-05: 20 mg via INTRAVENOUS
  Filled 2021-06-05: qty 20

## 2021-06-05 NOTE — Progress Notes (Signed)
Patients port flushed without difficulty.  Good blood return noted with no bruising or swelling noted at site.  Patient remains accessed for chemotherapy treatment.  Patient is in satisfactory condition.

## 2021-06-05 NOTE — Progress Notes (Signed)
Patient presents today for Keytruda/Taxol/Carbo infusions per providers order.  Vital signs within parameters for treatment.  Labs pending.  Patients only complaint is of constipation.  Patient currently taking Miralax.  Labs reviewed and within parameters for treatment.   Keytruda/Taxol/Carbo given today per MD orders.  Stable during infusion without adverse affects.  Vital signs stable.  No complaints at this time.  Discharge from clinic ambulatory in stable condition.  Alert and oriented X 3.  Follow up with [facility] as scheduled.

## 2021-06-05 NOTE — Patient Instructions (Signed)
[facility]  Discharge Instructions: Thank you for choosing [facility] to provide your oncology and hematology care.  If you have a lab appointment with the Cancer Center, please come in thru the Main Entrance and check in at the main information desk.  Wear comfortable clothing and clothing appropriate for easy access to any Portacath or PICC line.   We strive to give you quality time with your provider. You may need to reschedule your appointment if you arrive late (15 or more minutes).  Arriving late affects you and other patients whose appointments are after yours.  Also, if you miss three or more appointments without notifying the office, you may be dismissed from the clinic at the provider's discretion.      For prescription refill requests, have your pharmacy contact our office and allow 72 hours for refills to be completed.    Today you received the following chemotherapy and/or immunotherapy agents Keytruda/Taxol/Carbo      To help prevent nausea and vomiting after your treatment, we encourage you to take your nausea medication as directed.  BELOW ARE SYMPTOMS THAT SHOULD BE REPORTED IMMEDIATELY: *FEVER GREATER THAN 100.4 F (38 C) OR HIGHER *CHILLS OR SWEATING *NAUSEA AND VOMITING THAT IS NOT CONTROLLED WITH YOUR NAUSEA MEDICATION *UNUSUAL SHORTNESS OF BREATH *UNUSUAL BRUISING OR BLEEDING *URINARY PROBLEMS (pain or burning when urinating, or frequent urination) *BOWEL PROBLEMS (unusual diarrhea, constipation, pain near the anus) TENDERNESS IN MOUTH AND THROAT WITH OR WITHOUT PRESENCE OF ULCERS (sore throat, sores in mouth, or a toothache) UNUSUAL RASH, SWELLING OR PAIN  UNUSUAL VAGINAL DISCHARGE OR ITCHING   Items with * indicate a potential emergency and should be followed up as soon as possible or go to the Emergency Department if any problems should occur.  Please show the CHEMOTHERAPY ALERT CARD or IMMUNOTHERAPY ALERT CARD at check-in to the  Emergency Department and triage nurse.  Should you have questions after your visit or need to cancel or reschedule your appointment, please contact [facility] [phone]  and follow the prompts.  Office hours are 8:00 a.m. to 4:30 p.m. Monday - Friday. Please note that voicemails left after 4:00 p.m. may not be returned until the following business day.  We are closed weekends and major holidays. You have access to a nurse at all times for urgent questions. Please call the main number to the clinic [phone] and follow the prompts.  For any non-urgent questions, you may also contact your provider using MyChart. We now offer e-Visits for anyone 18 and older to request care online for non-urgent symptoms. For details visit [email or web address].   Also download the MyChart app! Go to the app store, search "MyChart", open the app, select [facility], and log in with your MyChart username and password.  Due to Covid, a mask is required upon entering the hospital/clinic. If you do not have a mask, one will be given to you upon arrival. For doctor visits, patients may have 1 support person aged 18 or older with them. For treatment visits, patients cannot have anyone with them due to current Covid guidelines and our immunocompromised population.  

## 2021-06-09 ENCOUNTER — Other Ambulatory Visit (HOSPITAL_COMMUNITY): Payer: Self-pay | Admitting: *Deleted

## 2021-06-09 DIAGNOSIS — R6889 Other general symptoms and signs: Secondary | ICD-10-CM

## 2021-06-09 DIAGNOSIS — D702 Other drug-induced agranulocytosis: Secondary | ICD-10-CM

## 2021-06-09 DIAGNOSIS — C50912 Malignant neoplasm of unspecified site of left female breast: Secondary | ICD-10-CM

## 2021-06-10 ENCOUNTER — Inpatient Hospital Stay (HOSPITAL_COMMUNITY): Payer: Medicare Other

## 2021-06-10 ENCOUNTER — Encounter (HOSPITAL_COMMUNITY): Payer: Self-pay

## 2021-06-10 ENCOUNTER — Other Ambulatory Visit: Payer: Self-pay

## 2021-06-10 VITALS — BP 115/69 | HR 81 | Temp 100.2°F | Resp 18

## 2021-06-10 DIAGNOSIS — D702 Other drug-induced agranulocytosis: Secondary | ICD-10-CM

## 2021-06-10 DIAGNOSIS — Z5112 Encounter for antineoplastic immunotherapy: Secondary | ICD-10-CM | POA: Diagnosis not present

## 2021-06-10 DIAGNOSIS — C7951 Secondary malignant neoplasm of bone: Secondary | ICD-10-CM | POA: Diagnosis not present

## 2021-06-10 DIAGNOSIS — C50912 Malignant neoplasm of unspecified site of left female breast: Secondary | ICD-10-CM | POA: Diagnosis not present

## 2021-06-10 DIAGNOSIS — R918 Other nonspecific abnormal finding of lung field: Secondary | ICD-10-CM | POA: Diagnosis not present

## 2021-06-10 DIAGNOSIS — C773 Secondary and unspecified malignant neoplasm of axilla and upper limb lymph nodes: Secondary | ICD-10-CM | POA: Diagnosis not present

## 2021-06-10 DIAGNOSIS — Z5111 Encounter for antineoplastic chemotherapy: Secondary | ICD-10-CM | POA: Diagnosis not present

## 2021-06-10 DIAGNOSIS — R6889 Other general symptoms and signs: Secondary | ICD-10-CM

## 2021-06-10 LAB — TSH: TSH: 2.73 u[IU]/mL (ref 0.350–4.500)

## 2021-06-10 LAB — CBC WITH DIFFERENTIAL/PLATELET
Abs Immature Granulocytes: 0.01 K/uL (ref 0.00–0.07)
Basophils Absolute: 0 K/uL (ref 0.0–0.1)
Basophils Relative: 1 %
Eosinophils Absolute: 0 K/uL (ref 0.0–0.5)
Eosinophils Relative: 1 %
HCT: 29.5 % — ABNORMAL LOW (ref 36.0–46.0)
Hemoglobin: 9.5 g/dL — ABNORMAL LOW (ref 12.0–15.0)
Immature Granulocytes: 1 %
Lymphocytes Relative: 55 %
Lymphs Abs: 0.8 K/uL (ref 0.7–4.0)
MCH: 34.3 pg — ABNORMAL HIGH (ref 26.0–34.0)
MCHC: 32.2 g/dL (ref 30.0–36.0)
MCV: 106.5 fL — ABNORMAL HIGH (ref 80.0–100.0)
Monocytes Absolute: 0.1 K/uL (ref 0.1–1.0)
Monocytes Relative: 7 %
Neutro Abs: 0.5 K/uL — ABNORMAL LOW (ref 1.7–7.7)
Neutrophils Relative %: 35 %
Platelets: 143 K/uL — ABNORMAL LOW (ref 150–400)
RBC: 2.77 MIL/uL — ABNORMAL LOW (ref 3.87–5.11)
RDW: 14.7 % (ref 11.5–15.5)
WBC: 1.5 K/uL — ABNORMAL LOW (ref 4.0–10.5)
nRBC: 0 % (ref 0.0–0.2)

## 2021-06-10 LAB — COMPREHENSIVE METABOLIC PANEL
ALT: 29 U/L (ref 0–44)
AST: 27 U/L (ref 15–41)
Albumin: 3.8 g/dL (ref 3.5–5.0)
Alkaline Phosphatase: 63 U/L (ref 38–126)
Anion gap: 4 — ABNORMAL LOW (ref 5–15)
BUN: 19 mg/dL (ref 8–23)
CO2: 24 mmol/L (ref 22–32)
Calcium: 8.7 mg/dL — ABNORMAL LOW (ref 8.9–10.3)
Chloride: 107 mmol/L (ref 98–111)
Creatinine, Ser: 0.77 mg/dL (ref 0.44–1.00)
GFR, Estimated: >60 mL/min (ref >60)
Glucose, Bld: 101 mg/dL — ABNORMAL HIGH (ref 70–99)
Potassium: 4.2 mmol/L (ref 3.5–5.1)
Sodium: 135 mmol/L (ref 135–145)
Total Bilirubin: 0.5 mg/dL (ref 0.3–1.2)
Total Protein: 6.9 g/dL (ref 6.5–8.1)

## 2021-06-10 LAB — MAGNESIUM: Magnesium: 1.7 mg/dL (ref 1.7–2.4)

## 2021-06-10 MED ORDER — FILGRASTIM-SNDZ 480 MCG/0.8ML IJ SOSY
480.0000 ug | PREFILLED_SYRINGE | Freq: Once | INTRAMUSCULAR | Status: AC
  Administered 2021-06-10: 480 ug via SUBCUTANEOUS
  Filled 2021-06-10: qty 0.8

## 2021-06-10 NOTE — Progress Notes (Signed)
Patient presents today for possible GCSF injection per providers order.  Vital signs within parameters for treatment.  Labs pending.  No new complaints at this time.  ANC noted to be 0.5.  Stable during GCSF administration without incident; injection site WNL; see MAR for injection details.  Patient tolerated procedure well and without incident.  No questions or complaints noted at this time. Discharge from clinic ambulatory in stable condition.  Alert and oriented X 3.  Follow up with [facility] as scheduled.

## 2021-06-10 NOTE — Patient Instructions (Signed)
[facility]  Discharge Instructions: Thank you for choosing [facility] to provide your oncology and hematology care.  If you have a lab appointment with the Cancer Center, please come in thru the Main Entrance and check in at the main information desk.  Wear comfortable clothing and clothing appropriate for easy access to any Portacath or PICC line.   We strive to give you quality time with your provider. You may need to reschedule your appointment if you arrive late (15 or more minutes).  Arriving late affects you and other patients whose appointments are after yours.  Also, if you miss three or more appointments without notifying the office, you may be dismissed from the clinic at the provider's discretion.      For prescription refill requests, have your pharmacy contact our office and allow 72 hours for refills to be completed.        To help prevent nausea and vomiting after your treatment, we encourage you to take your nausea medication as directed.  BELOW ARE SYMPTOMS THAT SHOULD BE REPORTED IMMEDIATELY: *FEVER GREATER THAN 100.4 F (38 C) OR HIGHER *CHILLS OR SWEATING *NAUSEA AND VOMITING THAT IS NOT CONTROLLED WITH YOUR NAUSEA MEDICATION *UNUSUAL SHORTNESS OF BREATH *UNUSUAL BRUISING OR BLEEDING *URINARY PROBLEMS (pain or burning when urinating, or frequent urination) *BOWEL PROBLEMS (unusual diarrhea, constipation, pain near the anus) TENDERNESS IN MOUTH AND THROAT WITH OR WITHOUT PRESENCE OF ULCERS (sore throat, sores in mouth, or a toothache) UNUSUAL RASH, SWELLING OR PAIN  UNUSUAL VAGINAL DISCHARGE OR ITCHING   Items with * indicate a potential emergency and should be followed up as soon as possible or go to the Emergency Department if any problems should occur.  Please show the CHEMOTHERAPY ALERT CARD or IMMUNOTHERAPY ALERT CARD at check-in to the Emergency Department and triage nurse.  Should you have questions after your visit or need to cancel  or reschedule your appointment, please contact  CANCER CENTER [phone]  and follow the prompts.  Office hours are 8:00 a.m. to 4:30 p.m. Monday - Friday. Please note that voicemails left after 4:00 p.m. may not be returned until the following business day.  We are closed weekends and major holidays. You have access to a nurse at all times for urgent questions. Please call the main number to the clinic [phone] and follow the prompts.  For any non-urgent questions, you may also contact your provider using MyChart. We now offer e-Visits for anyone 18 and older to request care online for non-urgent symptoms. For details visit [email or web address].   Also download the MyChart app! Go to the app store, search "MyChart", open the app, select [facility], and log in with your MyChart username and password.  Due to Covid, a mask is required upon entering the hospital/clinic. If you do not have a mask, one will be given to you upon arrival. For doctor visits, patients may have 1 support person aged 18 or older with them. For treatment visits, patients cannot have anyone with them due to current Covid guidelines and our immunocompromised population.  

## 2021-06-11 NOTE — Progress Notes (Signed)
[address], [place] [address]   CLINIC:  Medical Oncology/Hematology  PCP:  Neale Burly, MD [place] / [place] [address] [phone]   REASON FOR VISIT:  Follow-up for left breast cancer  PRIOR THERAPY: none  NGS Results: not done  CURRENT THERAPY: Weekly carboplatin and paclitaxel with every 3 weeks pembrolizumab (keynote-522) started on 02/27/2021  BRIEF ONCOLOGIC HISTORY:  Oncology History  Invasive lobular carcinoma of left breast in female [facility])  01/23/2021 Initial Diagnosis   Invasive lobular carcinoma of left breast in female [facility])   02/19/2021 Genetic Testing   Negative genetic testing on the CancerNext-Expanded+RNAinsight panel.  SMARCB1 VUS identified.  The CancerNext-Expanded gene panel offered by [facility] and includes sequencing and rearrangement analysis for the following 77 genes: AIP, ALK, APC*, ATM*, AXIN2, BAP1, BARD1, BLM, BMPR1A, BRCA1*, BRCA2*, BRIP1*, CDC73, CDH1*, CDK4, CDKN1B, CDKN2A, CHEK2*, CTNNA1, DICER1, FANCC, FH, FLCN, GALNT12, KIF1B, LZTR1, MAX, MEN1, MET, MLH1*, MSH2*, MSH3, MSH6*, MUTYH*, NBN, NF1*, NF2, NTHL1, PALB2*, PHOX2B, PMS2*, POT1, PRKAR1A, PTCH1, PTEN*, RAD51C*, RAD51D*, RB1, RECQL, RET, SDHA, SDHAF2, SDHB, SDHC, SDHD, SMAD4, SMARCA4, SMARCB1, SMARCE1, STK11, SUFU, TMEM127, TP53*, TSC1, TSC2, VHL and XRCC2 (sequencing and deletion/duplication); EGFR, EGLN1, HOXB13, KIT, MITF, PDGFRA, POLD1, and POLE (sequencing only); EPCAM and GREM1 (deletion/duplication only). DNA and RNA analyses performed for * genes. The report date is February 19, 2021.   02/27/2021 -  Chemotherapy    Patient is on Treatment Plan: BREAST PEMBROLIZUMAB + CARBOPLATIN D1,8,15+ PACLITAXEL D1,8,15 Q21D X 4 CYCLES / PEMBROLIZUMAB + AC Q21D X 4 CYCLES         CANCER STAGING: Cancer Staging Invasive lobular carcinoma of left breast in female [facility]) Staging form: Breast, AJCC 8th Edition - Clinical stage from 01/23/2021:  cT3, cN3c, G2, ER-, PR-, HER2- - Unsigned   INTERVAL HISTORY:  Ms. Khamiyah Grefe, a 70 y.o. female, returns for routine follow-up and consideration for next cycle of chemotherapy. Yatzil was last seen on 05/29/2021.  Due for day #8 cycle #4 of Carboplatin and Taxol today.   Overall, she tells me she has been feeling fair. She reports a sore throat, cough productive of brown sputum, fever, chills, dry mouth, headaches, and nosebleed starting 2 days ago. She reports occasional numbness in her toes while lying down and diarrhea.   Overall, she is not ready for next cycle of chemo today.   REVIEW OF SYSTEMS:  Review of Systems  Constitutional:  Positive for appetite change (50%), chills, fatigue (50%) and fever (100.9).  HENT:   Positive for nosebleeds and sore throat.   Respiratory:  Positive for cough and shortness of breath.   Cardiovascular:  Positive for chest pain.  Gastrointestinal:  Positive for constipation, diarrhea and nausea.  Musculoskeletal:  Positive for myalgias (3/10).  Neurological:  Positive for dizziness, headaches and numbness.  Psychiatric/Behavioral:  Positive for sleep disturbance.   All other systems reviewed and are negative.  PAST MEDICAL/SURGICAL HISTORY:  Past Medical History:  Diagnosis Date   Asthma    as child   Cancer ([place]) 12/2020   left breast IMC   Complication of anesthesia    pateitn states,' I coded when I had my D&Cmany years ago.   Family history of breast cancer    Hypertension    Hypothyroidism    PONV (postoperative nausea and vomiting)    Port-A-Cath in place 02/26/2021   Past Surgical History:  Procedure Laterality Date   ABDOMINAL HYSTERECTOMY  BIOPSY  01/17/2018   Procedure: BIOPSY;  Surgeon: Rogene Houston, MD;  [facility];  Service: Endoscopy;;  duodenum,gastric   CHOLECYSTECTOMY     COLONOSCOPY WITH PROPOFOL N/A 01/17/2018   Procedure: COLONOSCOPY WITH PROPOFOL;  Surgeon: Rogene Houston, MD;  Location: [facility]  ENDO [facility];  Service: Endoscopy;  Laterality: N/A;  7:30   DILATION AND CURETTAGE OF UTERUS     ESOPHAGOGASTRODUODENOSCOPY (EGD) WITH PROPOFOL N/A 01/17/2018   Procedure: ESOPHAGOGASTRODUODENOSCOPY (EGD) WITH PROPOFOL;  Surgeon: Rogene Houston, MD;  [facility];  Service: Endoscopy;  Laterality: N/A;   POLYPECTOMY  01/17/2018   Procedure: POLYPECTOMY;  Surgeon: Rogene Houston, MD;  [facility];  Service: Endoscopy;;  transverse colon, cecal   PORTACATH PLACEMENT Right 02/17/2021   Procedure: INSERTION PORT-A-CATH;  Surgeon: Coralie Keens, MD;  Location: [facility];  Service: General;  Laterality: Right;    SOCIAL HISTORY:  Social History   Socioeconomic History   Marital status: Widowed    Spouse name: Not on file   Number of children: 3   Years of education: Not on file   Highest education level: Not on file  Occupational History   Occupation: [employer]   Occupation: retired  Tobacco Use   Smoking status: Never   Smokeless tobacco: Never  Scientific laboratory technician Use: Never used  Substance and Sexual Activity   Alcohol use: Never   Drug use: Never   Sexual activity: Not Currently    Birth control/protection: Surgical  Other Topics Concern   Not on file  Social History Narrative   Not on file   Social Determinants of Health   Financial Resource Strain: Low Risk    Difficulty of Paying Living Expenses: Not hard at all  Food Insecurity: No Food Insecurity   Worried About Charity fundraiser in the Last Year: Never true   [place] in the Last Year: Never true  Transportation Needs: No Transportation Needs   Lack of Transportation (Medical): No   Lack of Transportation (Non-Medical): No  Physical Activity: Insufficiently Active   Days of Exercise per Week: 3 days   Minutes of Exercise per Session: 30 min  Stress: No Stress Concern Present   Feeling of Stress : Not at all  Social Connections: Moderately  Integrated   Frequency of Communication with Friends and Family: More than three times a week   Frequency of Social Gatherings with Friends and Family: More than three times a week   Attends Religious Services: More than 4 times per year   Active Member of Clubs or Organizations: No   Attends Music therapist: More than 4 times per year   Marital Status: Widowed  Human resources officer Violence: Not At Risk   Fear of Current or Ex-Partner: No   Emotionally Abused: No   Physically Abused: No   Sexually Abused: No    FAMILY HISTORY:  Family History  Problem Relation Age of Onset   Breast cancer Mother        dx in her 65s   Heart disease Mother    Thyroid disease Mother    Lung cancer Father        dx in his 6s   Thyroid disease Maternal Aunt    Thyroid nodules Maternal Grandmother 35       goiter   Thyroid disease Maternal Grandmother    Heart disease Maternal Grandfather  Heart disease Paternal Grandmother    Heart disease Paternal Grandfather    Thyroid disease Daughter    Thyroid disease Daughter    Cancer Maternal Uncle        NOS   Cancer Paternal Uncle        NOS   Breast cancer Cousin        pat first cousin died in her 65s;     CURRENT MEDICATIONS:  Current Outpatient Medications  Medication Sig Dispense Refill   amoxicillin-clavulanate (AUGMENTIN) 875-125 MG tablet Take 1 tablet by mouth 2 (two) times daily. 14 tablet 0   Acetaminophen (TYLENOL PO) Take 1 tablet by mouth as needed. (Patient not taking: Reported on 06/12/2021)     albuterol (VENTOLIN HFA) 108 (90 Base) MCG/ACT inhaler Inhale into the lungs.     benazepril (LOTENSIN) 20 MG tablet Take 20 mg by mouth daily.     CARBOPLATIN IV Inject into the vein once a week.     doxepin (SINEQUAN) 10 MG capsule Take 10 mg by mouth at bedtime.     famotidine (PEPCID) 20 MG tablet Take 1 tablet (20 mg total) by mouth daily. 30 tablet 0   filgrastim (NEUPOGEN) 480 MCG/0.8ML SOSY injection Inject 0.8 mLs  (480 mcg total) into the skin daily as needed for up to 8 doses (self-administer subcutaneously as directed by Dr. Delton Coombes as needed for neutropenia). 6.4 mL 0   gabapentin (NEURONTIN) 300 MG capsule Take 1 capsule by mouth at bedtime.     hydrochlorothiazide (MICROZIDE) 12.5 MG capsule Take 12.5 mg by mouth daily.     HYDROcodone-acetaminophen (NORCO) 5-325 MG tablet Take 1 tablet by mouth every 12 (twelve) hours as needed for moderate pain. 20 tablet 0   levothyroxine (SYNTHROID) 75 MCG tablet Take 100 mcg by mouth daily before breakfast.     lidocaine-prilocaine (EMLA) cream Apply a small amount to port a cath site and cover with plastic wrap 1 hour prior to infusion appointments 30 g 3   lisinopril (PRINIVIL,ZESTRIL) 40 MG tablet Take 40 mg by mouth daily.     lisinopril (ZESTRIL) 10 MG tablet Take by mouth.     magnesium oxide (MAG-OX) 400 (240 Mg) MG tablet Take 1 tablet (400 mg total) by mouth daily. 60 tablet 2   Melatonin 5 MG TABS Take 5 mg by mouth. As needed for sleep     metoprolol succinate (TOPROL-XL) 25 MG 24 hr tablet Take by mouth.     Omega-3 Fatty Acids (FISH OIL PO) Take by mouth.     PACLITAXEL IV Inject into the vein once a week.     Pembrolizumab (KEYTRUDA IV) Inject into the vein every 21 ( twenty-one) days.     prochlorperazine (COMPAZINE) 10 MG tablet Take 1 tablet (10 mg total) by mouth every 6 (six) hours as needed (Nausea or vomiting). 30 tablet 1   psyllium (METAMUCIL) 58.6 % packet Take by mouth.     traMADol (ULTRAM) 50 MG tablet Take by mouth.     triamcinolone cream (KENALOG) 0.1 % SMARTSIG:1 Application Topical 2-3 Times Daily     No current facility-administered medications for this visit.    ALLERGIES:  Allergies  Allergen Reactions   Exforge [Amlodipine Besylate-Valsartan] Nausea And Vomiting   Cheese    Levofloxacin In D5w Hives   Strawberry Extract Diarrhea    Seeds,nuts, lettuce, grapes   Tramadol Hcl Nausea And Vomiting   Yeast-Related  Products Hives    bread    PHYSICAL EXAM:  Performance status (ECOG): 1 - Symptomatic but completely ambulatory  There were no vitals filed for this visit. Wt Readings from Last 3 Encounters:  06/12/21 181 lb 4 oz (82.2 kg)  05/29/21 185 lb 3.2 oz (84 kg)  05/22/21 181 lb (82.1 kg)   Physical Exam Vitals reviewed.  Constitutional:      Appearance: Normal appearance.  Cardiovascular:     Rate and Rhythm: Normal rate and regular rhythm.     Pulses: Normal pulses.     Heart sounds: Normal heart sounds.  Pulmonary:     Effort: Pulmonary effort is normal.     Breath sounds: Normal breath sounds.  Neurological:     General: No focal deficit present.     Mental Status: She is alert and oriented to person, place, and time.  Psychiatric:        Mood and Affect: Mood normal.        Behavior: Behavior normal.    LABORATORY DATA:  I have reviewed the labs as listed.  CBC Latest Ref Rng & Units 06/12/2021 06/10/2021 06/05/2021  WBC 4.0 - 10.5 K/uL 3.8(L) 1.5(L) 6.0  Hemoglobin 12.0 - 15.0 g/dL 9.2(L) 9.5(L) 9.4(L)  Hematocrit 36.0 - 46.0 % 28.0(L) 29.5(L) 28.7(L)  Platelets 150 - 400 K/uL 156 143(L) 181   CMP Latest Ref Rng & Units 06/10/2021 06/05/2021 06/03/2021  Glucose 70 - 99 mg/dL 101(H) 113(H) 114(H)  BUN 8 - 23 mg/dL _0 Creatinine 0.44 - 1.00 mg/dL 0.77 0.77 0.84  Sodium 135 - 145 mmol/L 135 136 136  Potassium 3.5 - 5.1 mmol/L 4.2 3.8 3.8  Chloride 98 - 111 mmol/L 107 102 106  CO2 22 - 32 mmol/L _1 Calcium 8.9 - 10.3 mg/dL 8.7(L) 8.6(L) 8.5(L)  Total Protein 6.5 - 8.1 g/dL 6.9 6.7 7.1  Total Bilirubin 0.3 - 1.2 mg/dL 0.5 0.4 0.3  Alkaline Phos 38 - 126 U/L 63 64 64  AST 15 - 41 U/L _2 ALT 0 - 44 U/L _3 DIAGNOSTIC IMAGING:  I have independently reviewed the scans and discussed with the patient. No results found.   ASSESSMENT:  1.  T3N3c (stage IIIc) triple negative invasive lobular carcinoma of the left breast: - She felt lump in her  left breast for more than 6 months, but thought it was secondary to fibrocystic disease which she had all her life.  When she started having pain, she reached out to Dr. Sherrie Sport. - She previously had mammogram machine malfunction and had severe pain and traumatized by that experience.  She was having ultrasound of the breast every other year since then. - She was on hormone replacement therapy for close to 10 years (from age [age]). - Ultrasound of the left breast on 01/08/2021 showed hyper vascular hypoechoic mass at 12 o'clock position measuring 3.8 x 1.3 x 2.5 cm.  There are calcifications located within the mass.  Mass extends to the level of the skin with mild associated skin thickening.  At least 3 morphologically abnormal lymph nodes in the left axilla. - Mammogram showed irregular spiculated mass with associated calcifications in the retroareolar to upper left breast measuring approximately 6 cm.  There are at least 4 morphologically abnormal lymph nodes identified in the left axilla. - Ultrasound-guided left breast and left axillary lymph node biopsy on 01/15/2021 - Pathology consistent with invasive lobular carcinoma, E-cadherin negative.  ER/PR/HER2 negative.  HER2 2+ by IHC, negative by FISH.  Ki-67 is 5%.  Lymph node core biopsy was consistent with metastatic carcinoma.  Grade 2. - PET scan on 02/03/2021 showed involvement of left supraclavicular, subpectoral, axillary lymph nodes along with the breast mass.  10 mm left lung nodule which is hypometabolic.  Spinal cord lesion at T12 level with a strong uptake. - MRI of the lumbar spine with and without contrast on 02/20/2021 showed no mass or abnormal enhancement within the canal at the T12 level to correspond to the site of PET scan positive.  No marrow replacing bone lesion. - 2D echo on 02/21/2021 with EF 55-60%. - Weekly carboplatin and paclitaxel with every 3 weeks pembrolizumab (keynote-522) started on 02/27/2021.   2.  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  She is non-smoker. - Mother died of breast cancer.  Maternal grandmother died very young in her 12s, sister has fibrocystic disease.  Father died of lung cancer and was a smoker.   PLAN:  1.  T3N3c triple negative invasive lobular carcinoma of the left breast: - She has completed 10 weeks of carboplatin and paclitaxel along with pembrolizumab every 21 days. - Reviewed labs today which showed white count 3.8 with ANC of 2.9.  Platelet count is normal.  LFTs are normal. - However she had developed sore throat with cough and expectoration.  She had a fever of 100.9 today.  She also had chills. - We will hold her treatment today. - We will start her on Augmentin 875 mg twice daily.  We have also given a prescription for Diflucan as she develops yeast infection after antibiotic use. - We will check her blood counts and treat her next week if she feels well.  RTC 2 weeks for follow-up with labs and treatment.  2.  Hypothyroidism: - Continue Synthroid 75 mcg daily.  Last TSH was 2.7.  3.  Chest pain: - She develops chest pains if she receives more than 2 G-CSF injections in a week. - She received G-CSF on 06/10/2021 with improvement in white count to 3.8 and ANC of 2.9.  4.  Left wrist fracture: - She sustained left wrist fracture after fall on 05/02/2021. - Continue follow-up with Dr. Fredna Dow.  5.  Peripheral neuropathy: - Numbness in the toes and left hand fingertips has been stable.  6.  Hypomagnesemia: - Continue magnesium twice daily.  Magnesium is 1.7.   Orders placed this encounter:  No orders of the defined types were placed in this encounter.    Derek Jack, MD [facility] [phone]   I, Thana Ates, am acting as a scribe for Dr. Derek Jack.  I, Derek Jack MD, have reviewed the above documentation for accuracy and completeness, and I agree with the  above.

## 2021-06-12 ENCOUNTER — Inpatient Hospital Stay (HOSPITAL_COMMUNITY): Payer: Medicare Other

## 2021-06-12 ENCOUNTER — Inpatient Hospital Stay (HOSPITAL_BASED_OUTPATIENT_CLINIC_OR_DEPARTMENT_OTHER): Payer: Medicare Other | Admitting: Hematology

## 2021-06-12 ENCOUNTER — Other Ambulatory Visit: Payer: Self-pay

## 2021-06-12 DIAGNOSIS — C773 Secondary and unspecified malignant neoplasm of axilla and upper limb lymph nodes: Secondary | ICD-10-CM | POA: Diagnosis not present

## 2021-06-12 DIAGNOSIS — C50912 Malignant neoplasm of unspecified site of left female breast: Secondary | ICD-10-CM

## 2021-06-12 DIAGNOSIS — R918 Other nonspecific abnormal finding of lung field: Secondary | ICD-10-CM | POA: Diagnosis not present

## 2021-06-12 DIAGNOSIS — C7951 Secondary malignant neoplasm of bone: Secondary | ICD-10-CM | POA: Diagnosis not present

## 2021-06-12 DIAGNOSIS — Z95828 Presence of other vascular implants and grafts: Secondary | ICD-10-CM

## 2021-06-12 DIAGNOSIS — Z5112 Encounter for antineoplastic immunotherapy: Secondary | ICD-10-CM | POA: Diagnosis not present

## 2021-06-12 DIAGNOSIS — Z5111 Encounter for antineoplastic chemotherapy: Secondary | ICD-10-CM | POA: Diagnosis not present

## 2021-06-12 LAB — CBC WITH DIFFERENTIAL/PLATELET
Band Neutrophils: 7 %
Basophils Absolute: 0 10*3/uL (ref 0.0–0.1)
Basophils Relative: 0 %
Eosinophils Absolute: 0 10*3/uL (ref 0.0–0.5)
Eosinophils Relative: 1 %
HCT: 28 % — ABNORMAL LOW (ref 36.0–46.0)
Hemoglobin: 9.2 g/dL — ABNORMAL LOW (ref 12.0–15.0)
Lymphocytes Relative: 12 %
Lymphs Abs: 0.5 10*3/uL — ABNORMAL LOW (ref 0.7–4.0)
MCH: 34.8 pg — ABNORMAL HIGH (ref 26.0–34.0)
MCHC: 32.9 g/dL (ref 30.0–36.0)
MCV: 106.1 fL — ABNORMAL HIGH (ref 80.0–100.0)
Monocytes Absolute: 0.4 10*3/uL (ref 0.1–1.0)
Monocytes Relative: 10 %
Neutro Abs: 2.9 10*3/uL (ref 1.7–7.7)
Neutrophils Relative %: 70 %
Platelets: 156 10*3/uL (ref 150–400)
RBC: 2.64 MIL/uL — ABNORMAL LOW (ref 3.87–5.11)
RDW: 15.2 % (ref 11.5–15.5)
WBC: 3.8 10*3/uL — ABNORMAL LOW (ref 4.0–10.5)
nRBC: 0 % (ref 0.0–0.2)

## 2021-06-12 MED ORDER — FLUCONAZOLE 200 MG PO TABS: 200.0000 mg | ORAL_TABLET | Freq: Every day | ORAL | 0 refills | Status: DC

## 2021-06-12 MED ORDER — HEPARIN SOD (PORK) LOCK FLUSH 100 UNIT/ML IV SOLN
500.0000 [IU] | Freq: Once | INTRAVENOUS | Status: AC
  Administered 2021-06-12: 500 [IU] via INTRAVENOUS

## 2021-06-12 MED ORDER — SODIUM CHLORIDE 0.9% FLUSH
10.0000 mL | Freq: Once | INTRAVENOUS | Status: AC
Start: 2021-06-12 — End: 2021-06-12
  Administered 2021-06-12: 10 mL via INTRAVENOUS

## 2021-06-12 MED ORDER — AMOXICILLIN-POT CLAVULANATE 875-125 MG PO TABS: 1.0000 | ORAL_TABLET | Freq: Two times a day (BID) | ORAL | 0 refills | Status: DC

## 2021-06-12 NOTE — Patient Instructions (Addendum)
[facility] °Discharge Instructions ° °You were seen today by Dr. Katragadda. He went over your recent results. Dr. Katragadda will see you back in 2 weeks for labs and follow up. ° ° °Thank you for choosing [facility] at [facility] to provide your oncology and hematology care.  To afford each patient quality time with our provider, please arrive at least 15 minutes before your scheduled appointment time.  ° °If you have a lab appointment with the Cancer Center please come in thru the Main Entrance and check in at the main information desk ° °You need to re-schedule your appointment should you arrive 10 or more minutes late.  We strive to give you quality time with our providers, and arriving late affects you and other patients whose appointments are after yours.  Also, if you no show three or more times for appointments you may be dismissed from the clinic at the providers discretion.     °Again, thank you for choosing [facility].  Our hope is that these requests will decrease the amount of time that you wait before being seen by our physicians.       °_____________________________________________________________ ° °Should you have questions after your visit to [facility], please contact our office at [phone] between the hours of 8:00 a.m. and 4:30 p.m.  Voicemails left after 4:00 p.m. will not be returned until the following business day.  For prescription refill requests, have your pharmacy contact our office and allow 72 hours.   ° °Cancer Center Support Programs:  ° °> Cancer Support Group  °2nd Tuesday of the month 1pm-2pm, Journey Room  ° ° °

## 2021-06-12 NOTE — Progress Notes (Signed)
No treatment today per Dr. Delton Coombes, d/t patient not feeling well. Port flushed with good blood return noted. No bruising or swelling at site. Bandaid applied and patient discharged in satisfactory condition. VVS stable with no signs or symptoms of distressed noted.

## 2021-06-17 ENCOUNTER — Inpatient Hospital Stay (HOSPITAL_COMMUNITY): Payer: Medicare Other

## 2021-06-17 ENCOUNTER — Other Ambulatory Visit: Payer: Self-pay

## 2021-06-17 ENCOUNTER — Inpatient Hospital Stay (HOSPITAL_COMMUNITY): Payer: Medicare Other | Attending: Hematology

## 2021-06-17 DIAGNOSIS — S62102D Fracture of unspecified carpal bone, left wrist, subsequent encounter for fracture with routine healing: Secondary | ICD-10-CM | POA: Insufficient documentation

## 2021-06-17 DIAGNOSIS — Z23 Encounter for immunization: Secondary | ICD-10-CM | POA: Diagnosis not present

## 2021-06-17 DIAGNOSIS — R079 Chest pain, unspecified: Secondary | ICD-10-CM | POA: Insufficient documentation

## 2021-06-17 DIAGNOSIS — D702 Other drug-induced agranulocytosis: Secondary | ICD-10-CM

## 2021-06-17 DIAGNOSIS — G629 Polyneuropathy, unspecified: Secondary | ICD-10-CM | POA: Diagnosis not present

## 2021-06-17 DIAGNOSIS — I10 Essential (primary) hypertension: Secondary | ICD-10-CM | POA: Diagnosis not present

## 2021-06-17 DIAGNOSIS — C50912 Malignant neoplasm of unspecified site of left female breast: Secondary | ICD-10-CM | POA: Insufficient documentation

## 2021-06-17 DIAGNOSIS — W19XXXD Unspecified fall, subsequent encounter: Secondary | ICD-10-CM | POA: Insufficient documentation

## 2021-06-17 DIAGNOSIS — E039 Hypothyroidism, unspecified: Secondary | ICD-10-CM | POA: Insufficient documentation

## 2021-06-17 DIAGNOSIS — Z5111 Encounter for antineoplastic chemotherapy: Secondary | ICD-10-CM | POA: Insufficient documentation

## 2021-06-17 DIAGNOSIS — Z803 Family history of malignant neoplasm of breast: Secondary | ICD-10-CM | POA: Diagnosis not present

## 2021-06-17 DIAGNOSIS — Z5112 Encounter for antineoplastic immunotherapy: Secondary | ICD-10-CM | POA: Insufficient documentation

## 2021-06-17 LAB — CBC WITH DIFFERENTIAL/PLATELET
Basophils Absolute: 0 10*3/uL (ref 0.0–0.1)
Basophils Relative: 0 %
Eosinophils Absolute: 0.3 10*3/uL (ref 0.0–0.5)
Eosinophils Relative: 11 %
HCT: 30.9 % — ABNORMAL LOW (ref 36.0–46.0)
Hemoglobin: 10.2 g/dL — ABNORMAL LOW (ref 12.0–15.0)
Lymphocytes Relative: 47 %
Lymphs Abs: 1.5 10*3/uL (ref 0.7–4.0)
MCH: 34.7 pg — ABNORMAL HIGH (ref 26.0–34.0)
MCHC: 33 g/dL (ref 30.0–36.0)
MCV: 105.1 fL — ABNORMAL HIGH (ref 80.0–100.0)
Monocytes Absolute: 0.5 10*3/uL (ref 0.1–1.0)
Monocytes Relative: 15 %
Neutro Abs: 0.8 10*3/uL — ABNORMAL LOW (ref 1.7–7.7)
Neutrophils Relative %: 27 %
Platelets: 287 10*3/uL (ref 150–400)
RBC: 2.94 MIL/uL — ABNORMAL LOW (ref 3.87–5.11)
RDW: 14.5 % (ref 11.5–15.5)
WBC: 3.1 10*3/uL — ABNORMAL LOW (ref 4.0–10.5)
nRBC: 0 % (ref 0.0–0.2)

## 2021-06-17 LAB — COMPREHENSIVE METABOLIC PANEL
ALT: 17 U/L (ref 0–44)
AST: 18 U/L (ref 15–41)
Albumin: 3.8 g/dL (ref 3.5–5.0)
Alkaline Phosphatase: 68 U/L (ref 38–126)
Anion gap: 6 (ref 5–15)
BUN: 17 mg/dL (ref 8–23)
CO2: 27 mmol/L (ref 22–32)
Calcium: 9.1 mg/dL (ref 8.9–10.3)
Chloride: 106 mmol/L (ref 98–111)
Creatinine, Ser: 0.8 mg/dL (ref 0.44–1.00)
GFR, Estimated: >60 mL/min (ref >60)
Glucose, Bld: 103 mg/dL — ABNORMAL HIGH (ref 70–99)
Potassium: 4.1 mmol/L (ref 3.5–5.1)
Sodium: 139 mmol/L (ref 135–145)
Total Bilirubin: 0.3 mg/dL (ref 0.3–1.2)
Total Protein: 7.2 g/dL (ref 6.5–8.1)

## 2021-06-17 LAB — MAGNESIUM: Magnesium: 1.7 mg/dL (ref 1.7–2.4)

## 2021-06-17 LAB — TSH: TSH: 2.506 u[IU]/mL (ref 0.350–4.500)

## 2021-06-17 MED ORDER — FILGRASTIM-SNDZ 480 MCG/0.8ML IJ SOSY
480.0000 ug | PREFILLED_SYRINGE | Freq: Once | INTRAMUSCULAR | Status: AC
  Administered 2021-06-17: 480 ug via SUBCUTANEOUS
  Filled 2021-06-17: qty 0.8

## 2021-06-19 ENCOUNTER — Ambulatory Visit (HOSPITAL_COMMUNITY)
Admission: RE | Admit: 2021-06-19 | Discharge: 2021-06-19 | Disposition: A | Discharge status: Home / Self Care | Payer: Medicare Other | Source: Ambulatory Visit | Attending: Hematology | Admitting: Hematology

## 2021-06-19 ENCOUNTER — Inpatient Hospital Stay (HOSPITAL_COMMUNITY): Payer: Medicare Other

## 2021-06-19 ENCOUNTER — Encounter (HOSPITAL_COMMUNITY): Payer: Self-pay

## 2021-06-19 ENCOUNTER — Other Ambulatory Visit: Payer: Self-pay

## 2021-06-19 VITALS — BP 115/95 | HR 68 | Temp 98.6°F | Resp 18

## 2021-06-19 VITALS — BP 137/79 | HR 81 | Temp 99.2°F | Resp 18 | Wt 184.0 lb

## 2021-06-19 DIAGNOSIS — C50912 Malignant neoplasm of unspecified site of left female breast: Secondary | ICD-10-CM

## 2021-06-19 DIAGNOSIS — Z95828 Presence of other vascular implants and grafts: Secondary | ICD-10-CM

## 2021-06-19 DIAGNOSIS — R052 Subacute cough: Secondary | ICD-10-CM | POA: Insufficient documentation

## 2021-06-19 DIAGNOSIS — R911 Solitary pulmonary nodule: Secondary | ICD-10-CM | POA: Diagnosis not present

## 2021-06-19 LAB — CBC WITH DIFFERENTIAL/PLATELET
Abs Immature Granulocytes: 0.24 10*3/uL — ABNORMAL HIGH (ref 0.00–0.07)
Basophils Absolute: 0.1 10*3/uL (ref 0.0–0.1)
Basophils Relative: 0 %
Eosinophils Absolute: 0.3 10*3/uL (ref 0.0–0.5)
Eosinophils Relative: 2 %
HCT: 30.3 % — ABNORMAL LOW (ref 36.0–46.0)
Hemoglobin: 9.7 g/dL — ABNORMAL LOW (ref 12.0–15.0)
Immature Granulocytes: 2 %
Lymphocytes Relative: 13 %
Lymphs Abs: 1.9 10*3/uL (ref 0.7–4.0)
MCH: 33.9 pg (ref 26.0–34.0)
MCHC: 32 g/dL (ref 30.0–36.0)
MCV: 105.9 fL — ABNORMAL HIGH (ref 80.0–100.0)
Monocytes Absolute: 0.4 10*3/uL (ref 0.1–1.0)
Monocytes Relative: 3 %
Neutro Abs: 12.2 10*3/uL — ABNORMAL HIGH (ref 1.7–7.7)
Neutrophils Relative %: 80 %
Platelets: 265 10*3/uL (ref 150–400)
RBC: 2.86 MIL/uL — ABNORMAL LOW (ref 3.87–5.11)
RDW: 14.8 % (ref 11.5–15.5)
WBC: 15.2 10*3/uL — ABNORMAL HIGH (ref 4.0–10.5)
nRBC: 0 % (ref 0.0–0.2)

## 2021-06-19 LAB — COMPREHENSIVE METABOLIC PANEL WITH GFR
ALT: 16 U/L (ref 0–44)
AST: 17 U/L (ref 15–41)
Albumin: 3.5 g/dL (ref 3.5–5.0)
Alkaline Phosphatase: 75 U/L (ref 38–126)
Anion gap: 7 (ref 5–15)
BUN: 15 mg/dL (ref 8–23)
CO2: 27 mmol/L (ref 22–32)
Calcium: 8.7 mg/dL — ABNORMAL LOW (ref 8.9–10.3)
Chloride: 105 mmol/L (ref 98–111)
Creatinine, Ser: 0.7 mg/dL (ref 0.44–1.00)
GFR, Estimated: >60 mL/min
Glucose, Bld: 122 mg/dL — ABNORMAL HIGH (ref 70–99)
Potassium: 3.7 mmol/L (ref 3.5–5.1)
Sodium: 139 mmol/L (ref 135–145)
Total Bilirubin: 0.5 mg/dL (ref 0.3–1.2)
Total Protein: 7 g/dL (ref 6.5–8.1)

## 2021-06-19 IMAGING — DX DG CHEST 2V
2 series · 2 of 2 positions shown · non-contrast
Comparison: [DATE], [DATE], PET-CT [DATE]

CLINICAL DATA: 70-year-old female with a history of shortness of
breath and congestion

EXAM:
CHEST - 2 VIEW

[chest pa]
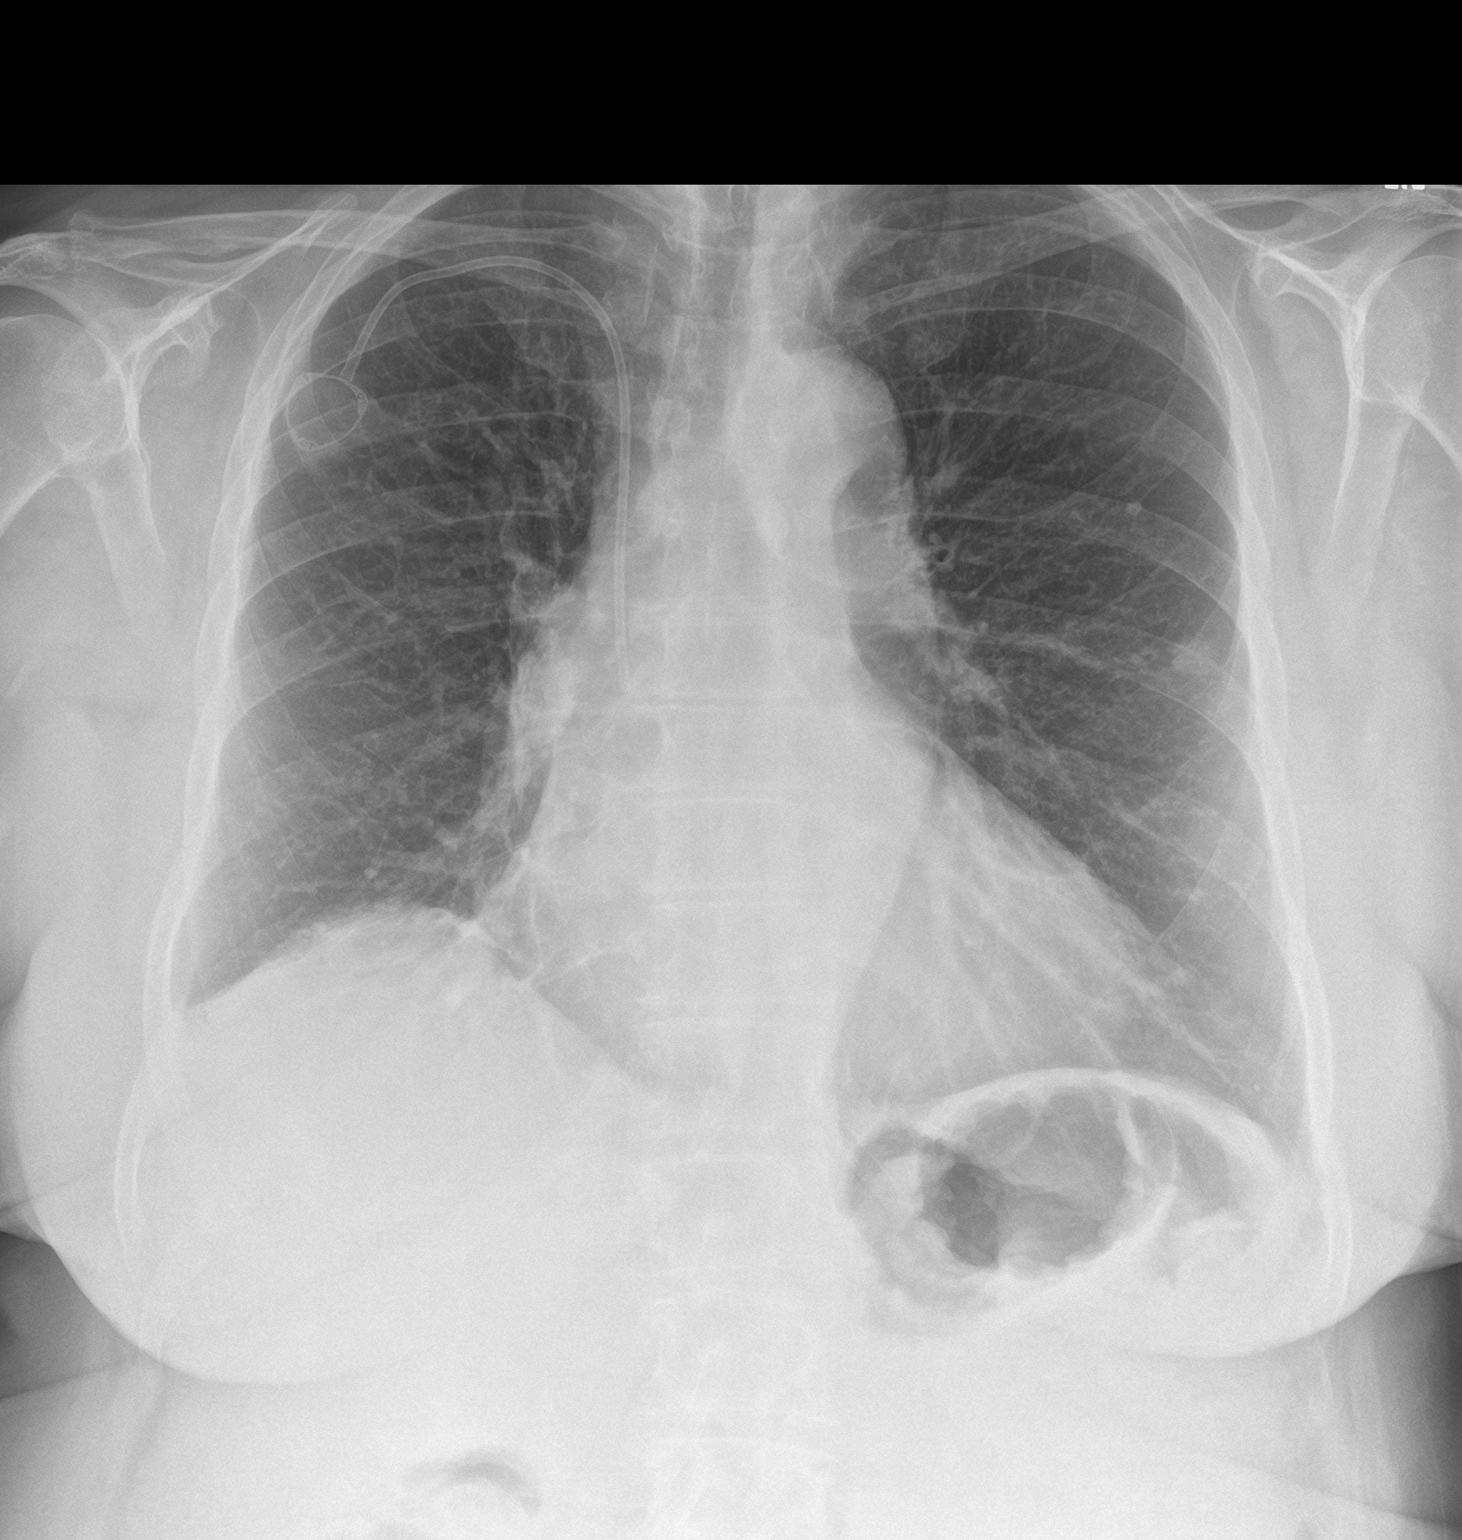

[chest lat]
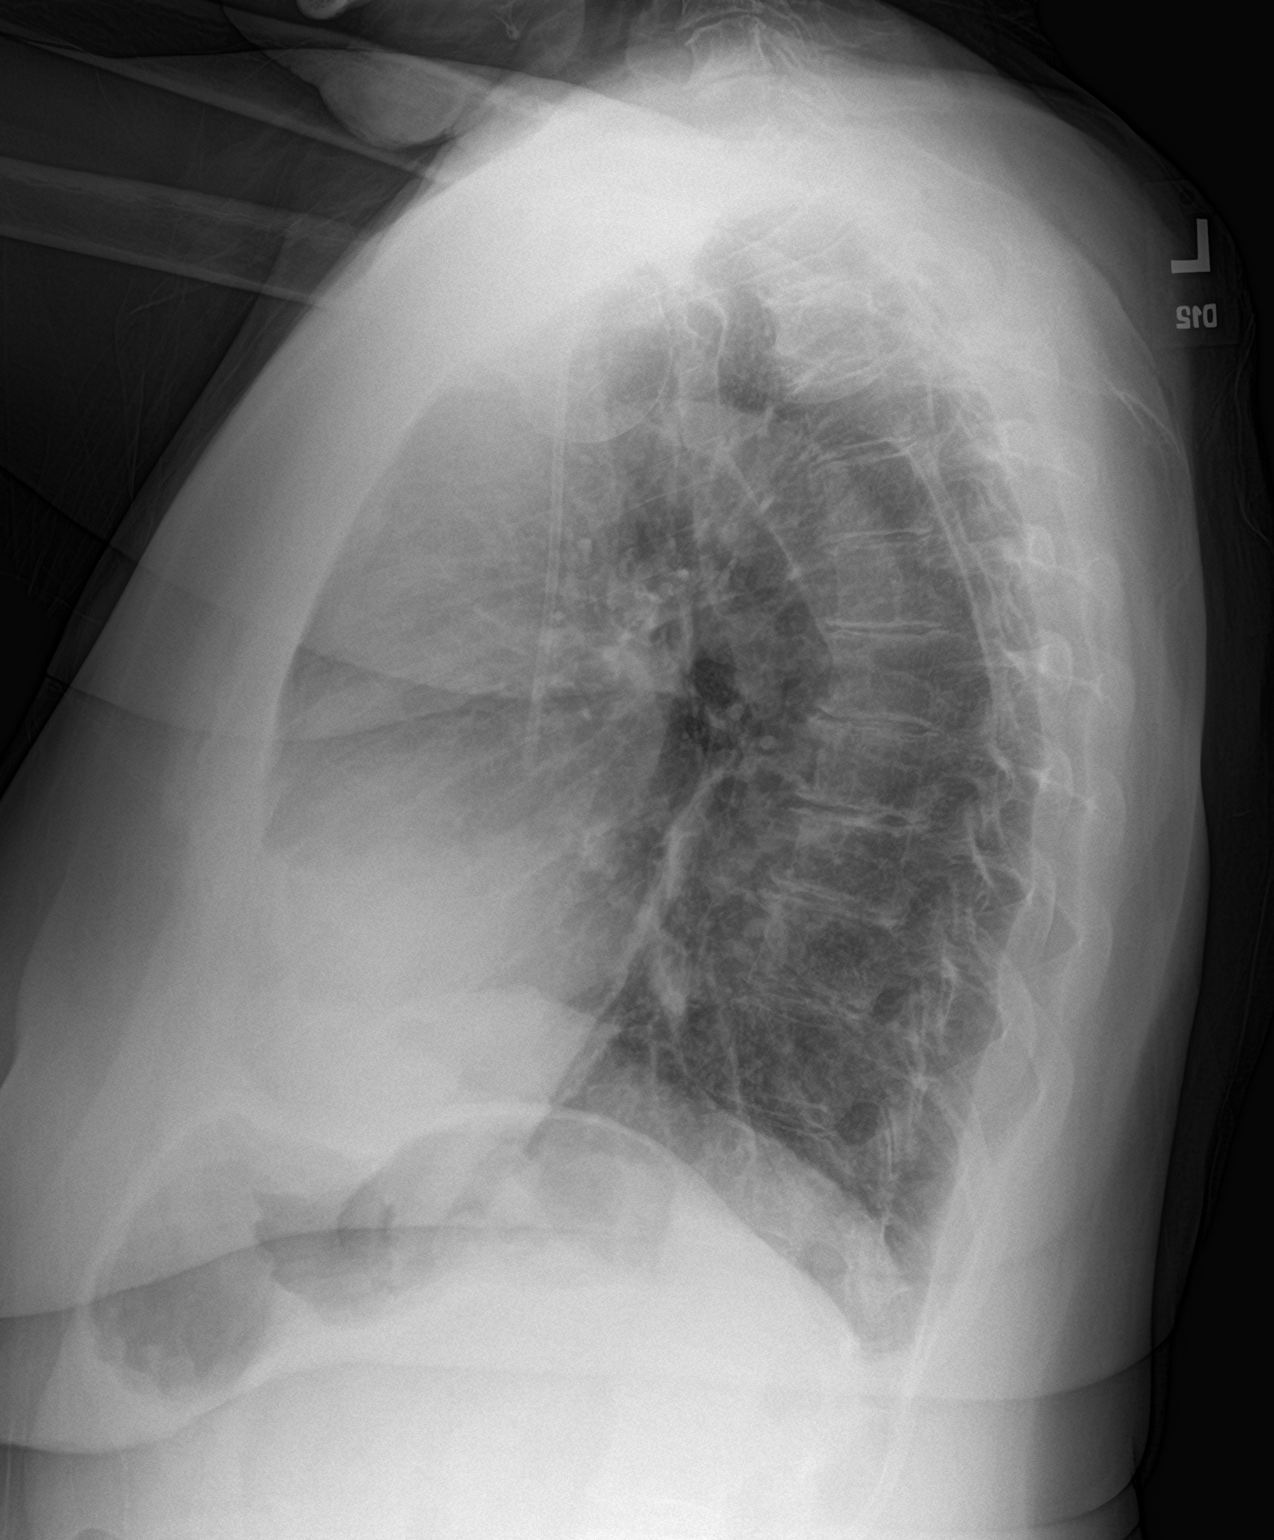

[2 of 2 positions shown; findings below may reference images not displayed]

FINDINGS: Cardiomediastinal silhouette unchanged in size and contour. No
evidence of central vascular congestion. No interlobular septal
thickening.

Unchanged right subclavian port catheter.

Nodules of the left chest unchanged.

Asymmetric elevation the right hemidiaphragm.

No pneumothorax or pleural effusion. Coarsened interstitial
markings, with no confluent airspace disease.

No acute displaced fracture. Degenerative changes of the spine.
IMPRESSION: Similar appearance of chronic changes and left lung nodules with no
evidence of acute cardiopulmonary disease.

## 2021-06-19 MED ORDER — PACLITAXEL CHEMO INJECTION 300 MG/50ML
64.0000 mg/m2 | Freq: Once | INTRAVENOUS | Status: AC
  Administered 2021-06-19: 126 mg via INTRAVENOUS
  Filled 2021-06-19: qty 21

## 2021-06-19 MED ORDER — PALONOSETRON HCL INJECTION 0.25 MG/5ML
0.2500 mg | Freq: Once | INTRAVENOUS | Status: AC
  Administered 2021-06-19: 0.25 mg via INTRAVENOUS
  Filled 2021-06-19: qty 5

## 2021-06-19 MED ORDER — SODIUM CHLORIDE 0.9 % IV SOLN
10.0000 mg | Freq: Once | INTRAVENOUS | Status: AC
  Administered 2021-06-19: 10 mg via INTRAVENOUS
  Filled 2021-06-19: qty 10

## 2021-06-19 MED ORDER — SODIUM CHLORIDE 0.9% FLUSH
10.0000 mL | INTRAVENOUS | Status: DC | PRN
  Administered 2021-06-19 (×2): 10 mL

## 2021-06-19 MED ORDER — FAMOTIDINE 20 MG IN NS 100 ML IVPB
20.0000 mg | Freq: Once | INTRAVENOUS | Status: AC
  Administered 2021-06-19: 20 mg via INTRAVENOUS
  Filled 2021-06-19: qty 100

## 2021-06-19 MED ORDER — SODIUM CHLORIDE 0.9 % IV SOLN: Freq: Once | INTRAVENOUS | Status: AC

## 2021-06-19 MED ORDER — HEPARIN SOD (PORK) LOCK FLUSH 100 UNIT/ML IV SOLN
500.0000 [IU] | Freq: Once | INTRAVENOUS | Status: AC | PRN
  Administered 2021-06-19: 500 [IU]

## 2021-06-19 MED ORDER — CARBOPLATIN CHEMO INJECTION 450 MG/45ML
135.4500 mg | Freq: Once | INTRAVENOUS | Status: AC
  Administered 2021-06-19: 140 mg via INTRAVENOUS
  Filled 2021-06-19: qty 14

## 2021-06-19 MED ORDER — DIPHENHYDRAMINE HCL 50 MG/ML IJ SOLN
50.0000 mg | Freq: Once | INTRAMUSCULAR | Status: AC
  Administered 2021-06-19: 50 mg via INTRAVENOUS
  Filled 2021-06-19: qty 1

## 2021-06-19 NOTE — Progress Notes (Signed)
Labs resulted, okay for treatment, plan signed by Dr. Delton Coombes. Patient tolerated chemotherapy with no complaints voiced. Side effects with management reviewed understanding verbalized. Port site clean and dry with no bruising or swelling noted at site. Good blood return noted before and after administration of chemotherapy. Band aid applied. Patient left in satisfactory condition with VSS and no s/s of distress noted.

## 2021-06-19 NOTE — Patient Instructions (Signed)
[facility]  Discharge Instructions: Thank you for choosing [facility] to provide your oncology and hematology care.  If you have a lab appointment with the [facility], please come in thru the Main Entrance and check in at the main information desk.  Wear comfortable clothing and clothing appropriate for easy access to any Portacath or PICC line.   We strive to give you quality time with your provider. You may need to reschedule your appointment if you arrive late (15 or more minutes).  Arriving late affects you and other patients whose appointments are after yours.  Also, if you miss three or more appointments without notifying the office, you may be dismissed from the clinic at the provider's discretion.      For prescription refill requests, have your pharmacy contact our office and allow 72 hours for refills to be completed.    Today you received the following chemotherapy and/or immunotherapy agents; Taxol and Carbo, return as scheduled.   To help prevent nausea and vomiting after your treatment, we encourage you to take your nausea medication as directed.  BELOW ARE SYMPTOMS THAT SHOULD BE REPORTED IMMEDIATELY: *FEVER GREATER THAN 100.4 F (38 C) OR HIGHER *CHILLS OR SWEATING *NAUSEA AND VOMITING THAT IS NOT CONTROLLED WITH YOUR NAUSEA MEDICATION *UNUSUAL SHORTNESS OF BREATH *UNUSUAL BRUISING OR BLEEDING *URINARY PROBLEMS (pain or burning when urinating, or frequent urination) *BOWEL PROBLEMS (unusual diarrhea, constipation, pain near the anus) TENDERNESS IN MOUTH AND THROAT WITH OR WITHOUT PRESENCE OF ULCERS (sore throat, sores in mouth, or a toothache) UNUSUAL RASH, SWELLING OR PAIN  UNUSUAL VAGINAL DISCHARGE OR ITCHING   Items with * indicate a potential emergency and should be followed up as soon as possible or go to the Emergency Department if any problems should occur.  Please show the CHEMOTHERAPY ALERT CARD or IMMUNOTHERAPY ALERT CARD at  check-in to the Emergency Department and triage nurse.  Should you have questions after your visit or need to cancel or reschedule your appointment, please contact [facility] [phone]  and follow the prompts.  Office hours are 8:00 a.m. to 4:30 p.m. Monday - Friday. Please note that voicemails left after 4:00 p.m. may not be returned until the following business day.  We are closed weekends and major holidays. You have access to a nurse at all times for urgent questions. Please call the main number to the clinic [phone] and follow the prompts.  For any non-urgent questions, you may also contact your provider using MyChart. We now offer e-Visits for anyone 22 and older to request care online for non-urgent symptoms. For details visit [email or web address].   Also download the MyChart app! Go to the app store, search "MyChart", open the app, select [facility], and log in with your MyChart username and password.  Due to Covid, a mask is required upon entering the hospital/clinic. If you do not have a mask, one will be given to you upon arrival. For doctor visits, patients may have 1 support person aged 34 or older with them. For treatment visits, patients cannot have anyone with them due to current Covid guidelines and our immunocompromised population.

## 2021-06-24 ENCOUNTER — Inpatient Hospital Stay (HOSPITAL_COMMUNITY): Payer: Medicare Other

## 2021-06-24 ENCOUNTER — Encounter (HOSPITAL_COMMUNITY): Payer: Self-pay

## 2021-06-24 ENCOUNTER — Other Ambulatory Visit: Payer: Self-pay

## 2021-06-24 VITALS — BP 118/64 | HR 87 | Temp 98.0°F | Resp 16

## 2021-06-24 DIAGNOSIS — Z5112 Encounter for antineoplastic immunotherapy: Secondary | ICD-10-CM | POA: Diagnosis not present

## 2021-06-24 DIAGNOSIS — Z5111 Encounter for antineoplastic chemotherapy: Secondary | ICD-10-CM | POA: Diagnosis not present

## 2021-06-24 DIAGNOSIS — Z95828 Presence of other vascular implants and grafts: Secondary | ICD-10-CM

## 2021-06-24 DIAGNOSIS — R079 Chest pain, unspecified: Secondary | ICD-10-CM | POA: Diagnosis not present

## 2021-06-24 DIAGNOSIS — S62102D Fracture of unspecified carpal bone, left wrist, subsequent encounter for fracture with routine healing: Secondary | ICD-10-CM | POA: Diagnosis not present

## 2021-06-24 DIAGNOSIS — C50912 Malignant neoplasm of unspecified site of left female breast: Secondary | ICD-10-CM | POA: Diagnosis not present

## 2021-06-24 DIAGNOSIS — D702 Other drug-induced agranulocytosis: Secondary | ICD-10-CM

## 2021-06-24 DIAGNOSIS — Z23 Encounter for immunization: Secondary | ICD-10-CM | POA: Diagnosis not present

## 2021-06-24 LAB — CBC WITH DIFFERENTIAL/PLATELET
Abs Immature Granulocytes: 0.02 10*3/uL (ref 0.00–0.07)
Basophils Absolute: 0 10*3/uL (ref 0.0–0.1)
Basophils Relative: 1 %
Eosinophils Absolute: 0.1 10*3/uL (ref 0.0–0.5)
Eosinophils Relative: 6 %
HCT: 31.3 % — ABNORMAL LOW (ref 36.0–46.0)
Hemoglobin: 10.4 g/dL — ABNORMAL LOW (ref 12.0–15.0)
Immature Granulocytes: 1 %
Lymphocytes Relative: 62 %
Lymphs Abs: 1.5 10*3/uL (ref 0.7–4.0)
MCH: 35.1 pg — ABNORMAL HIGH (ref 26.0–34.0)
MCHC: 33.2 g/dL (ref 30.0–36.0)
MCV: 105.7 fL — ABNORMAL HIGH (ref 80.0–100.0)
Monocytes Absolute: 0.2 10*3/uL (ref 0.1–1.0)
Monocytes Relative: 9 %
Neutro Abs: 0.5 10*3/uL — ABNORMAL LOW (ref 1.7–7.7)
Neutrophils Relative %: 21 %
Platelets: 251 10*3/uL (ref 150–400)
RBC: 2.96 MIL/uL — ABNORMAL LOW (ref 3.87–5.11)
RDW: 14.3 % (ref 11.5–15.5)
Smear Review: NORMAL
WBC: 2.3 10*3/uL — ABNORMAL LOW (ref 4.0–10.5)
nRBC: 0 % (ref 0.0–0.2)

## 2021-06-24 LAB — COMPREHENSIVE METABOLIC PANEL
ALT: 20 U/L (ref 0–44)
AST: 23 U/L (ref 15–41)
Albumin: 3.9 g/dL (ref 3.5–5.0)
Alkaline Phosphatase: 67 U/L (ref 38–126)
Anion gap: 7 (ref 5–15)
BUN: 18 mg/dL (ref 8–23)
CO2: 25 mmol/L (ref 22–32)
Calcium: 8.6 mg/dL — ABNORMAL LOW (ref 8.9–10.3)
Chloride: 104 mmol/L (ref 98–111)
Creatinine, Ser: 0.81 mg/dL (ref 0.44–1.00)
GFR, Estimated: >60 mL/min (ref >60)
Glucose, Bld: 118 mg/dL — ABNORMAL HIGH (ref 70–99)
Potassium: 4 mmol/L (ref 3.5–5.1)
Sodium: 136 mmol/L (ref 135–145)
Total Bilirubin: 0.4 mg/dL (ref 0.3–1.2)
Total Protein: 7.3 g/dL (ref 6.5–8.1)

## 2021-06-24 LAB — MAGNESIUM: Magnesium: 1.7 mg/dL (ref 1.7–2.4)

## 2021-06-24 MED ORDER — FILGRASTIM-SNDZ 480 MCG/0.8ML IJ SOSY
480.0000 ug | PREFILLED_SYRINGE | Freq: Once | INTRAMUSCULAR | Status: AC
Start: 2021-06-24 — End: 2021-06-24
  Administered 2021-06-24: 480 ug via SUBCUTANEOUS
  Filled 2021-06-24: qty 0.8

## 2021-06-24 NOTE — Patient Instructions (Signed)
[facility]  Discharge Instructions: Thank you for choosing [facility] to provide your oncology and hematology care.  If you have a lab appointment with the [facility], please come in thru the Main Entrance and check in at the main information desk.  Wear comfortable clothing and clothing appropriate for easy access to any Portacath or PICC line.   We strive to give you quality time with your provider. You may need to reschedule your appointment if you arrive late (15 or more minutes).  Arriving late affects you and other patients whose appointments are after yours.  Also, if you miss three or more appointments without notifying the office, you may be dismissed from the clinic at the provider's discretion.      For prescription refill requests, have your pharmacy contact our office and allow 72 hours for refills to be completed.    Today you received the following chemotherapy and/or immunotherapy agents Zarxio 480 mcg.  ANC 0.5 today.      To help prevent nausea and vomiting after your treatment, we encourage you to take your nausea medication as directed.  BELOW ARE SYMPTOMS THAT SHOULD BE REPORTED IMMEDIATELY: *FEVER GREATER THAN 100.4 F (38 C) OR HIGHER *CHILLS OR SWEATING *NAUSEA AND VOMITING THAT IS NOT CONTROLLED WITH YOUR NAUSEA MEDICATION *UNUSUAL SHORTNESS OF BREATH *UNUSUAL BRUISING OR BLEEDING *URINARY PROBLEMS (pain or burning when urinating, or frequent urination) *BOWEL PROBLEMS (unusual diarrhea, constipation, pain near the anus) TENDERNESS IN MOUTH AND THROAT WITH OR WITHOUT PRESENCE OF ULCERS (sore throat, sores in mouth, or a toothache) UNUSUAL RASH, SWELLING OR PAIN  UNUSUAL VAGINAL DISCHARGE OR ITCHING   Items with * indicate a potential emergency and should be followed up as soon as possible or go to the Emergency Department if any problems should occur.  Please show the CHEMOTHERAPY ALERT CARD or IMMUNOTHERAPY ALERT CARD at check-in  to the Emergency Department and triage nurse.  Should you have questions after your visit or need to cancel or reschedule your appointment, please contact [facility] [phone]  and follow the prompts.  Office hours are 8:00 a.m. to 4:30 p.m. Monday - Friday. Please note that voicemails left after 4:00 p.m. may not be returned until the following business day.  We are closed weekends and major holidays. You have access to a nurse at all times for urgent questions. Please call the main number to the clinic [phone] and follow the prompts.  For any non-urgent questions, you may also contact your provider using MyChart. We now offer e-Visits for anyone 14 and older to request care online for non-urgent symptoms. For details visit [email or web address].   Also download the MyChart app! Go to the app store, search "MyChart", open the app, select [facility], and log in with your MyChart username and password.  Due to Covid, a mask is required upon entering the hospital/clinic. If you do not have a mask, one will be given to you upon arrival. For doctor visits, patients may have 1 support person aged 57 or older with them. For treatment visits, patients cannot have anyone with them due to current Covid guidelines and our immunocompromised population.   Filgrastim, G-CSF injection What is this medication? FILGRASTIM, G-CSF (fil GRA stim) is a granulocyte colony-stimulating factor that stimulates the growth of neutrophils, a type of white blood cell (WBC) important in the body's fight against infection. It is used to reduce the incidence of fever and infection in patients with certain  types of cancer who are receiving chemotherapy that affects the bone marrow, to stimulate blood cell production for removal of WBCs from the body prior to a bone marrow transplantation, to reduce the incidence of fever and infection in patients who have severe chronic neutropenia, and to improve survival  outcomes following high-dose radiation exposure that is toxic to the bone marrow. This medicine may be used for other purposes; ask your health care provider or pharmacist if you have questions. COMMON BRAND NAME(S): Neupogen, Nivestym, Releuko, Zarxio What should I tell my care team before I take this medication? They need to know if you have any of these conditions: kidney disease latex allergy ongoing radiation therapy sickle cell disease an unusual or allergic reaction to filgrastim, pegfilgrastim, other medicines, foods, dyes, or preservatives pregnant or trying to get pregnant breast-feeding How should I use this medication? This medicine is for injection under the skin or infusion into a vein. As an infusion into a vein, it is usually given by a health care professional in a hospital or clinic setting. If you get this medicine at home, you will be taught how to prepare and give this medicine. Refer to the Instructions for Use that come with your medication packaging. Use exactly as directed. Take your medicine at regular intervals. Do not take your medicine more often than directed. It is important that you put your used needles and syringes in a special sharps container. Do not put them in a trash can. If you do not have a sharps container, call your pharmacist or healthcare provider to get one. Talk to your pediatrician regarding the use of this medicine in children. While this drug may be prescribed for children as young as 7 months for selected conditions, precautions do apply. Overdosage: If you think you have taken too much of this medicine contact a poison control center or emergency room at once. NOTE: This medicine is only for you. Do not share this medicine with others. What if I miss a dose? It is important not to miss your dose. Call your doctor or health care professional if you miss a dose. What may interact with this medication? This medicine may interact with the following  medications: medicines that may cause a release of neutrophils, such as lithium This list may not describe all possible interactions. Give your health care provider a list of all the medicines, herbs, non-prescription drugs, or dietary supplements you use. Also tell them if you smoke, drink alcohol, or use illegal drugs. Some items may interact with your medicine. What should I watch for while using this medication? Your condition will be monitored carefully while you are receiving this medicine. You may need blood work done while you are taking this medicine. Talk to your health care provider about your risk of cancer. You may be more at risk for certain types of cancer if you take this medicine. What side effects may I notice from receiving this medication? Side effects that you should report to your doctor or health care professional as soon as possible: allergic reactions like skin rash, itching or hives, swelling of the face, lips, or tongue back pain dizziness or feeling faint fever pain, redness, or irritation at site where injected pinpoint red spots on the skin shortness of breath or breathing problems signs and symptoms of kidney injury like trouble passing urine, change in the amount of urine, or red or dark-brown urine stomach or side pain, or pain at the shoulder swelling tiredness unusual bleeding  or bruising Side effects that usually do not require medical attention (report to your doctor or health care professional if they continue or are bothersome): bone pain cough diarrhea hair loss headache muscle pain This list may not describe all possible side effects. Call your doctor for medical advice about side effects. You may report side effects to FDA at 1-800-FDA-1088. Where should I keep my medication? Keep out of the reach of children. Store in a refrigerator between 2 and 8 degrees C (36 and 46 degrees F). Do not freeze. Keep in carton to protect from light. Throw away  this medicine if vials or syringes are left out of the refrigerator for more than 24 hours. Throw away any unused medicine after the expiration date. NOTE: This sheet is a summary. It may not cover all possible information. If you have questions about this medicine, talk to your doctor, pharmacist, or health care provider.  2022 [employer] Standard (2019-09-21 18:47:55)

## 2021-06-24 NOTE — Progress Notes (Signed)
Denise [name] ANC 0.5.  Zarxio 480 mcg given. Vital signs stable.

## 2021-06-24 NOTE — Progress Notes (Signed)
Denise [name] presents today for injection per the provider's orders.  Zarxio administration without incident; injection site WNL; see MAR for injection details.  Patient tolerated procedure well and without incident.  No questions or complaints noted at this time.   Zarxio given today per MD orders. Tolerated infusion without adverse affects. Vital signs stable. No complaints at this time. Discharged from clinic ambulatory in stable condition. Alert and oriented x 3. F/U with [facility] as scheduled.

## 2021-06-25 NOTE — Progress Notes (Signed)
Denise [name], [place] [address]   CLINIC:  Medical Oncology/Hematology  PCP:  Neale Burly, MD [place] / [place] [id number] [phone]   REASON FOR VISIT:  Follow-up for left breast cancer  PRIOR THERAPY: none  NGS Results: not done  CURRENT THERAPY: Weekly carboplatin and paclitaxel with every 3 weeks pembrolizumab (keynote-522) started on 02/27/2021  BRIEF ONCOLOGIC HISTORY:  Oncology History  Invasive lobular carcinoma of left breast in female [facility])  01/23/2021 Initial Diagnosis   Invasive lobular carcinoma of left breast in female [facility])   02/19/2021 Genetic Testing   Negative genetic testing on the CancerNext-Expanded+RNAinsight panel.  SMARCB1 VUS identified.  The CancerNext-Expanded gene panel offered by [facility] and includes sequencing and rearrangement analysis for the following 77 genes: AIP, ALK, APC*, ATM*, AXIN2, BAP1, BARD1, BLM, BMPR1A, BRCA1*, BRCA2*, BRIP1*, CDC73, CDH1*, CDK4, CDKN1B, CDKN2A, CHEK2*, CTNNA1, DICER1, FANCC, FH, FLCN, GALNT12, KIF1B, LZTR1, MAX, MEN1, MET, MLH1*, MSH2*, MSH3, MSH6*, MUTYH*, NBN, NF1*, NF2, NTHL1, PALB2*, PHOX2B, PMS2*, POT1, PRKAR1A, PTCH1, PTEN*, RAD51C*, RAD51D*, RB1, RECQL, RET, SDHA, SDHAF2, SDHB, SDHC, SDHD, SMAD4, SMARCA4, SMARCB1, SMARCE1, STK11, SUFU, TMEM127, TP53*, TSC1, TSC2, VHL and XRCC2 (sequencing and deletion/duplication); EGFR, EGLN1, HOXB13, KIT, MITF, PDGFRA, POLD1, and POLE (sequencing only); EPCAM and GREM1 (deletion/duplication only). DNA and RNA analyses performed for * genes. The report date is February 19, 2021.   02/27/2021 -  Chemotherapy   Patient is on Treatment Plan : BREAST Pembrolizumab + Carboplatin D1,8,15+ Paclitaxel D1,8,15 q21d X 4 cycles / Pembrolizumab + AC q21d x 4 cycles       CANCER STAGING: Cancer Staging Invasive lobular carcinoma of left breast in female [facility]: [facility] Rhonda W) Staging form: Breast, AJCC 8th Edition - Clinical stage from 01/23/2021: cT3,  cN3c, G2, ER-, PR-, HER2- - Unsigned   INTERVAL HISTORY:  [name], a 70 y.o. female, returns for routine follow-up and consideration for next cycle of chemotherapy. Denise [name] was last seen on 06/12/2021.  Due for day #15 cycle #4 of Carboplatin and taxol today.   Overall, she tells me she has been feeling pretty well. Her left wrist fracture has improved, and she has removed her cast. She still experiences weakness in her left wrist, but that is also improving. She wears a compression glove on her left hand to reduce swelling in her fingers. She reports hearing loss in her right ear following an ear infection. She reports constant stable numbness in her toes but denies this affecting her gait or any numbness in her finger for the past 2-3 weeks; she denies any associated pain. She denies n/v/d and skin rash. She reports occasional cough. She reports pain at the site of her port-a-cath in her right chest when laying down.   Overall, she feels ready for next cycle of chemo today.   REVIEW OF SYSTEMS:  Review of Systems  Constitutional:  Negative for appetite change and fatigue (75%).  HENT:   Positive for hearing loss (R ear).   Respiratory:  Positive for cough.   Cardiovascular:  Positive for chest pain (R chest @  port site) and palpitations.  Gastrointestinal:  Negative for diarrhea, nausea and vomiting.  Musculoskeletal:  Negative for gait problem.  Skin:  Negative for rash.  Neurological:  Positive for extremity weakness (weakness L wrist) and numbness (toes). Negative for gait problem.  All other systems reviewed and are negative.  PAST MEDICAL/SURGICAL HISTORY:  Past Medical History:  Diagnosis Date  Asthma    as child   Cancer [facility]) 12/2020   left breast IMC   Complication of anesthesia    pateitn states,' I coded when I had my D&Cmany years ago.   Family history of breast cancer    Hypertension    Hypothyroidism    PONV (postoperative nausea and vomiting)     Port-A-Cath in place 02/26/2021   Past Surgical History:  Procedure Laterality Date   ABDOMINAL HYSTERECTOMY     BIOPSY  01/17/2018   Procedure: BIOPSY;  Surgeon: Rogene Houston, MD;  [facility];  Service: Endoscopy;;  duodenum,gastric   CHOLECYSTECTOMY     COLONOSCOPY WITH PROPOFOL N/A 01/17/2018   Procedure: COLONOSCOPY WITH PROPOFOL;  Surgeon: Rogene Houston, MD;  [facility];  Service: Endoscopy;  Laterality: N/A;  7:30   DILATION AND CURETTAGE OF UTERUS     ESOPHAGOGASTRODUODENOSCOPY (EGD) WITH PROPOFOL N/A 01/17/2018   Procedure: ESOPHAGOGASTRODUODENOSCOPY (EGD) WITH PROPOFOL;  Surgeon: Rogene Houston, MD;  [facility];  Service: Endoscopy;  Laterality: N/A;   POLYPECTOMY  01/17/2018   Procedure: POLYPECTOMY;  Surgeon: Rogene Houston, MD;  [facility];  Service: Endoscopy;;  transverse colon, cecal   PORTACATH PLACEMENT Right 02/17/2021   Procedure: INSERTION PORT-A-CATH;  Surgeon: Coralie Keens, MD;  Location: [place];  Service: General;  Laterality: Right;    SOCIAL HISTORY:  Social History   Socioeconomic History   Marital status: Widowed    Spouse name: Not on file   Number of children: 3   Years of education: Not on file   Highest education level: Not on file  Occupational History   Occupation: [employer]   Occupation: retired  Tobacco Use   Smoking status: Never   Smokeless tobacco: Never  Scientific laboratory technician Use: Never used  Substance and Sexual Activity   Alcohol use: Never   Drug use: Never   Sexual activity: Not Currently    Birth control/protection: Surgical  Other Topics Concern   Not on file  Social History Narrative   Not on file   Social Determinants of Health   Financial Resource Strain: Low Risk    Difficulty of Paying Living Expenses: Not hard at all  Food Insecurity: No Food Insecurity   Worried About Charity fundraiser in the Last Year: Never true   [place] in the Last Year: Never true  Transportation Needs: No Transportation Needs   Lack of Transportation (Medical): No   Lack of Transportation (Non-Medical): No  Physical Activity: Insufficiently Active   Days of Exercise per Week: 3 days   Minutes of Exercise per Session: 30 min  Stress: No Stress Concern Present   Feeling of Stress : Not at all  Social Connections: Moderately Integrated   Frequency of Communication with Friends and Family: More than three times a week   Frequency of Social Gatherings with Friends and Family: More than three times a week   Attends Religious Services: More than 4 times per year   Active Member of Clubs or Organizations: No   Attends Music therapist: More than 4 times per year   Marital Status: Widowed  Human resources officer Violence: Not At Risk   Fear of Current or Ex-Partner: No   Emotionally Abused: No   Physically Abused: No   Sexually Abused: No    FAMILY HISTORY:  Family History  Problem Relation Age of  Onset   Breast cancer Mother        dx in her 8s   Heart disease Mother    Thyroid disease Mother    Lung cancer Father        dx in his 53s   Thyroid disease Maternal Aunt    Thyroid nodules Maternal Grandmother 89       goiter   Thyroid disease Maternal Grandmother    Heart disease Maternal Grandfather    Heart disease Paternal Grandmother    Heart disease Paternal Grandfather    Thyroid disease Daughter    Thyroid disease Daughter    Cancer Maternal Uncle        NOS   Cancer Paternal Uncle        NOS   Breast cancer Cousin        pat first cousin died in her 55s;     CURRENT MEDICATIONS:  Current Outpatient Medications  Medication Sig Dispense Refill   Acetaminophen (TYLENOL PO) Take 1 tablet by mouth as needed. (Patient not taking: No sig reported)     albuterol (VENTOLIN HFA) 108 (90 Base) MCG/ACT inhaler Inhale into the lungs.     amoxicillin (AMOXIL) 500 MG capsule Take 500 mg by mouth 3 (three)  times daily.     amoxicillin-clavulanate (AUGMENTIN) 875-125 MG tablet Take 1 tablet by mouth 2 (two) times daily. 14 tablet 0   benazepril (LOTENSIN) 20 MG tablet Take 20 mg by mouth daily.     CARBOPLATIN IV Inject into the vein once a week.     doxepin (SINEQUAN) 10 MG capsule Take 10 mg by mouth at bedtime.     famotidine (PEPCID) 20 MG tablet Take 1 tablet (20 mg total) by mouth daily. 30 tablet 0   filgrastim (NEUPOGEN) 480 MCG/0.8ML SOSY injection Inject 0.8 mLs (480 mcg total) into the skin daily as needed for up to 8 doses (self-administer subcutaneously as directed by Dr. Delton Coombes as needed for neutropenia). 6.4 mL 0   fluconazole (DIFLUCAN) 200 MG tablet Take 1 tablet (200 mg total) by mouth daily. 2 tablet 0   gabapentin (NEURONTIN) 300 MG capsule Take 1 capsule by mouth at bedtime.     hydrochlorothiazide (MICROZIDE) 12.5 MG capsule Take 12.5 mg by mouth daily.     HYDROcodone-acetaminophen (NORCO) 5-325 MG tablet Take 1 tablet by mouth every 12 (twelve) hours as needed for moderate pain. 20 tablet 0   levothyroxine (SYNTHROID) 75 MCG tablet Take 100 mcg by mouth daily before breakfast.     lidocaine-prilocaine (EMLA) cream Apply a small amount to port a cath site and cover with plastic wrap 1 hour prior to infusion appointments 30 g 3   lisinopril (PRINIVIL,ZESTRIL) 40 MG tablet Take 40 mg by mouth daily.     lisinopril (ZESTRIL) 10 MG tablet Take by mouth.     magnesium oxide (MAG-OX) 400 (240 Mg) MG tablet Take 1 tablet (400 mg total) by mouth daily. 60 tablet 2   Melatonin 5 MG TABS Take 5 mg by mouth. As needed for sleep     metoprolol succinate (TOPROL-XL) 25 MG 24 hr tablet Take by mouth.     Omega-3 Fatty Acids (FISH OIL PO) Take by mouth.     PACLITAXEL IV Inject into the vein once a week.     Pembrolizumab (KEYTRUDA IV) Inject into the vein every 21 ( twenty-one) days.     prochlorperazine (COMPAZINE) 10 MG tablet Take 1 tablet (10 mg total) by mouth every  6 (six) hours  as needed (Nausea or vomiting). 30 tablet 1   psyllium (METAMUCIL) 58.6 % packet Take by mouth.     traMADol (ULTRAM) 50 MG tablet Take by mouth.     triamcinolone cream (KENALOG) 0.1 % SMARTSIG:1 Application Topical 2-3 Times Daily     No current facility-administered medications for this visit.    ALLERGIES:  Allergies  Allergen Reactions   Exforge [Amlodipine Besylate-Valsartan] Nausea And Vomiting   Cheese    Levofloxacin In D5w Hives   Strawberry Extract Diarrhea    Seeds,nuts, lettuce, grapes   Tramadol Hcl Nausea And Vomiting   Yeast-Related Products Hives    bread    PHYSICAL EXAM:  Performance status (ECOG): 1 - Symptomatic but completely ambulatory  There were no vitals filed for this visit. Wt Readings from Last 3 Encounters:  06/19/21 184 lb (83.5 kg)  06/12/21 181 lb 4 oz (82.2 kg)  05/29/21 185 lb 3.2 oz (84 kg)   Physical Exam Vitals reviewed.  Constitutional:      Appearance: Normal appearance.     Comments: R side port-a-cath  Cardiovascular:     Rate and Rhythm: Normal rate and regular rhythm.     Pulses: Normal pulses.     Heart sounds: Normal heart sounds.  Pulmonary:     Effort: Pulmonary effort is normal.     Breath sounds: Normal breath sounds.  Chest:  Breasts:    Left: Mass (2 inch above areola 12:00 position) present.  Musculoskeletal:     Right lower leg: No edema.     Left lower leg: No edema.  Lymphadenopathy:     Upper Body:     Left upper body: No axillary or pectoral adenopathy.  Neurological:     General: No focal deficit present.     Mental Status: She is alert and oriented to person, place, and time.  Psychiatric:        Mood and Affect: Mood normal.        Behavior: Behavior normal.    LABORATORY DATA:  I have reviewed the labs as listed.  CBC Latest Ref Rng & Units 06/24/2021 06/19/2021 06/17/2021  WBC 4.0 - 10.5 K/uL 2.3(L) 15.2(H) 3.1(L)  Hemoglobin 12.0 - 15.0 g/dL 10.4(L) 9.7(L) 10.2(L)  Hematocrit 36.0 - 46.0 %  31.3(L) 30.3(L) 30.9(L)  Platelets 150 - 400 K/uL 251 265 287   CMP Latest Ref Rng & Units 06/24/2021 06/19/2021 06/17/2021  Glucose 70 - 99 mg/dL 118(H) 122(H) 103(H)  BUN 8 - 23 mg/dL '18 15 17  ' Creatinine 0.44 - 1.00 mg/dL 0.81 0.70 0.80  Sodium 135 - 145 mmol/L 136 139 139  Potassium 3.5 - 5.1 mmol/L 4.0 3.7 4.1  Chloride 98 - 111 mmol/L 104 105 106  CO2 22 - 32 mmol/L '25 27 27  ' Calcium 8.9 - 10.3 mg/dL 8.6(L) 8.7(L) 9.1  Total Protein 6.5 - 8.1 g/dL 7.3 7.0 7.2  Total Bilirubin 0.3 - 1.2 mg/dL 0.4 0.5 0.3  Alkaline Phos 38 - 126 U/L 67 75 68  AST 15 - 41 U/L '23 17 18  ' ALT 0 - 44 U/L '20 16 17    ' DIAGNOSTIC IMAGING:  I have independently reviewed the scans and discussed with the patient. DG Chest 2 View  Result Date: 06/22/2021 CLINICAL DATA:  70 year old female with a history of shortness of breath and congestion EXAM: CHEST - 2 VIEW COMPARISON:  04/19/2021, 02/17/2021, PET-CT 04/04/2021 FINDINGS: Cardiomediastinal silhouette unchanged in size and contour. No evidence of central  vascular congestion. No interlobular septal thickening. Unchanged right subclavian port catheter. Nodules of the left chest unchanged. Asymmetric elevation the right hemidiaphragm. No pneumothorax or pleural effusion. Coarsened interstitial markings, with no confluent airspace disease. No acute displaced fracture. Degenerative changes of the spine. IMPRESSION: Similar appearance of chronic changes and left lung nodules with no evidence of acute cardiopulmonary disease. Electronically Signed   By: Corrie Mckusick D.O.   On: 06/22/2021 08:45     ASSESSMENT:  1.  T3N3c (stage IIIc) triple negative invasive lobular carcinoma of the left breast: - She felt lump in her left breast for more than 6 months, but thought it was secondary to fibrocystic disease which she had all her life.  When she started having pain, she reached out to Dr. Sherrie Sport. - She previously had mammogram machine malfunction and had severe pain and  traumatized by that experience.  She was having ultrasound of the breast every other year since then. - She was on hormone replacement therapy for close to 10 years (from age [age]). - Ultrasound of the left breast on 01/08/2021 showed hyper vascular hypoechoic mass at 12 o'clock position measuring 3.8 x 1.3 x 2.5 cm.  There are calcifications located within the mass.  Mass extends to the level of the skin with mild associated skin thickening.  At least 3 morphologically abnormal lymph nodes in the left axilla. - Mammogram showed irregular spiculated mass with associated calcifications in the retroareolar to upper left breast measuring approximately 6 cm.  There are at least 4 morphologically abnormal lymph nodes identified in the left axilla. - Ultrasound-guided left breast and left axillary lymph node biopsy on 01/15/2021 - Pathology consistent with invasive lobular carcinoma, E-cadherin negative.  ER/PR/HER2 negative.  HER2 2+ by IHC, negative by FISH.  Ki-67 is 5%.  Lymph node core biopsy was consistent with metastatic carcinoma.  Grade 2. - PET scan on 02/03/2021 showed involvement of left supraclavicular, subpectoral, axillary lymph nodes along with the breast mass.  10 mm left lung nodule which is hypometabolic.  Spinal cord lesion at T12 level with a strong uptake. - MRI of the lumbar spine with and without contrast on 02/20/2021 showed no mass or abnormal enhancement within the canal at the T12 level to correspond to the site of PET scan positive.  No marrow replacing bone lesion. - 2D echo on 02/21/2021 with EF 55-60%. - Weekly carboplatin and paclitaxel with every 3 weeks pembrolizumab (keynote-522) started on 02/27/2021.   2.  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  She is non-smoker. - Mother died of breast cancer.  Maternal grandmother died very young in her 69s, sister has fibrocystic disease.  Father died of lung cancer and was a  smoker.   PLAN:  1.  T3N3c triple negative invasive lobular carcinoma of the left breast: - She has completed 11 weeks of carboplatin and paclitaxel along with pembrolizumab. - Breast exam today showed about 5 cm mass above the areola at 12 o'clock position which has significantly improved from prior to start of therapy.  No adenopathy palpable. - Reviewed labs today which showed white count has improved after G-CSF.  Platelet count was normal.  She has macrocytic anemia probably from myelosuppression.  LFTs are normal. - We will proceed with week 12 of carboplatin and paclitaxel today. - We talked about starting AC with pembrolizumab next week.  She will receive long-acting growth factor.  We talked about the side effects in  detail. - We will plan to see her back in 4 weeks for follow-up with repeat labs.  2.  Hypothyroidism: - Continue Synthroid 75 mcg daily.  Last TSH was 2.5.  3.  Chest pain: - She develops chest pains if she receives more than 2 G-CSF injections per week. - Likely she did not have to receive second injection this week.  4.  Left wrist fracture: - She sustained left wrist fracture after fall on 05/02/2021. - She self discontinued cast.  She is able to move her wrist fairly well. - Continue physical therapy.  5.  Peripheral neuropathy: - Numbness in the toes is lasting most of the time during the day.  No pain reported. - We will start on gabapentin if there is any worsening.  6.  Hypomagnesemia: - Magnesium is 1.7 today.  Continue magnesium twice daily.   Orders placed this encounter:  No orders of the defined types were placed in this encounter.    Derek Jack, MD [place] [phone]   I, Thana Ates, am acting as a scribe for Dr. Derek Jack.  I, Derek Jack MD, have reviewed the above documentation for accuracy and completeness, and I agree with the above.

## 2021-06-26 ENCOUNTER — Other Ambulatory Visit: Payer: Self-pay

## 2021-06-26 ENCOUNTER — Inpatient Hospital Stay (HOSPITAL_COMMUNITY): Payer: Medicare Other

## 2021-06-26 ENCOUNTER — Inpatient Hospital Stay (HOSPITAL_BASED_OUTPATIENT_CLINIC_OR_DEPARTMENT_OTHER): Payer: Medicare Other | Admitting: Hematology

## 2021-06-26 VITALS — BP 106/89 | HR 75 | Temp 98.4°F | Resp 18

## 2021-06-26 VITALS — BP 115/80 | HR 83 | Temp 97.9°F | Resp 18 | Wt 182.0 lb

## 2021-06-26 DIAGNOSIS — C50912 Malignant neoplasm of unspecified site of left female breast: Secondary | ICD-10-CM

## 2021-06-26 DIAGNOSIS — Z95828 Presence of other vascular implants and grafts: Secondary | ICD-10-CM

## 2021-06-26 DIAGNOSIS — Z23 Encounter for immunization: Secondary | ICD-10-CM | POA: Diagnosis not present

## 2021-06-26 DIAGNOSIS — S62102D Fracture of unspecified carpal bone, left wrist, subsequent encounter for fracture with routine healing: Secondary | ICD-10-CM | POA: Diagnosis not present

## 2021-06-26 DIAGNOSIS — Z5111 Encounter for antineoplastic chemotherapy: Secondary | ICD-10-CM | POA: Diagnosis not present

## 2021-06-26 DIAGNOSIS — Z5112 Encounter for antineoplastic immunotherapy: Secondary | ICD-10-CM | POA: Diagnosis not present

## 2021-06-26 DIAGNOSIS — R079 Chest pain, unspecified: Secondary | ICD-10-CM | POA: Diagnosis not present

## 2021-06-26 LAB — COMPREHENSIVE METABOLIC PANEL
ALT: 12 U/L (ref 0–44)
AST: 16 U/L (ref 15–41)
Albumin: 3.7 g/dL (ref 3.5–5.0)
Alkaline Phosphatase: 71 U/L (ref 38–126)
Anion gap: 7 (ref 5–15)
BUN: 17 mg/dL (ref 8–23)
CO2: 26 mmol/L (ref 22–32)
Calcium: 8.9 mg/dL (ref 8.9–10.3)
Chloride: 104 mmol/L (ref 98–111)
Creatinine, Ser: 0.77 mg/dL (ref 0.44–1.00)
GFR, Estimated: >60 mL/min (ref >60)
Glucose, Bld: 121 mg/dL — ABNORMAL HIGH (ref 70–99)
Potassium: 3.8 mmol/L (ref 3.5–5.1)
Sodium: 137 mmol/L (ref 135–145)
Total Bilirubin: 0.4 mg/dL (ref 0.3–1.2)
Total Protein: 7 g/dL (ref 6.5–8.1)

## 2021-06-26 LAB — CBC WITH DIFFERENTIAL/PLATELET
Abs Immature Granulocytes: 0.14 10*3/uL — ABNORMAL HIGH (ref 0.00–0.07)
Basophils Absolute: 0 10*3/uL (ref 0.0–0.1)
Basophils Relative: 0 %
Eosinophils Absolute: 0.2 10*3/uL (ref 0.0–0.5)
Eosinophils Relative: 2 %
HCT: 30.2 % — ABNORMAL LOW (ref 36.0–46.0)
Hemoglobin: 9.8 g/dL — ABNORMAL LOW (ref 12.0–15.0)
Immature Granulocytes: 1 %
Lymphocytes Relative: 14 %
Lymphs Abs: 1.5 10*3/uL (ref 0.7–4.0)
MCH: 34.5 pg — ABNORMAL HIGH (ref 26.0–34.0)
MCHC: 32.5 g/dL (ref 30.0–36.0)
MCV: 106.3 fL — ABNORMAL HIGH (ref 80.0–100.0)
Monocytes Absolute: 0.5 10*3/uL (ref 0.1–1.0)
Monocytes Relative: 5 %
Neutro Abs: 8.3 10*3/uL — ABNORMAL HIGH (ref 1.7–7.7)
Neutrophils Relative %: 78 %
Platelets: 253 10*3/uL (ref 150–400)
RBC: 2.84 MIL/uL — ABNORMAL LOW (ref 3.87–5.11)
RDW: 14.7 % (ref 11.5–15.5)
WBC: 10.7 10*3/uL — ABNORMAL HIGH (ref 4.0–10.5)
nRBC: 0 % (ref 0.0–0.2)

## 2021-06-26 LAB — MAGNESIUM: Magnesium: 1.7 mg/dL (ref 1.7–2.4)

## 2021-06-26 MED ORDER — FAMOTIDINE 20 MG IN NS 100 ML IVPB
20.0000 mg | Freq: Once | INTRAVENOUS | Status: AC
  Administered 2021-06-26: 20 mg via INTRAVENOUS
  Filled 2021-06-26: qty 20

## 2021-06-26 MED ORDER — INFLUENZA VAC A&B SA ADJ QUAD 0.5 ML IM PRSY
0.5000 mL | PREFILLED_SYRINGE | Freq: Once | INTRAMUSCULAR | Status: AC
  Administered 2021-06-26: 0.5 mL via INTRAMUSCULAR
  Filled 2021-06-26: qty 0.5

## 2021-06-26 MED ORDER — SODIUM CHLORIDE 0.9 % IV SOLN: Freq: Once | INTRAVENOUS | Status: AC

## 2021-06-26 MED ORDER — SODIUM CHLORIDE 0.9% FLUSH
10.0000 mL | INTRAVENOUS | Status: DC | PRN
  Administered 2021-06-26: 10 mL

## 2021-06-26 MED ORDER — DIPHENHYDRAMINE HCL 50 MG/ML IJ SOLN
50.0000 mg | Freq: Once | INTRAMUSCULAR | Status: AC
  Administered 2021-06-26: 50 mg via INTRAVENOUS
  Filled 2021-06-26: qty 1

## 2021-06-26 MED ORDER — HEPARIN SOD (PORK) LOCK FLUSH 100 UNIT/ML IV SOLN
500.0000 [IU] | Freq: Once | INTRAVENOUS | Status: AC | PRN
  Administered 2021-06-26: 500 [IU]

## 2021-06-26 MED ORDER — PALONOSETRON HCL INJECTION 0.25 MG/5ML
0.2500 mg | Freq: Once | INTRAVENOUS | Status: AC
  Administered 2021-06-26: 0.25 mg via INTRAVENOUS
  Filled 2021-06-26: qty 5

## 2021-06-26 MED ORDER — SODIUM CHLORIDE 0.9 % IV SOLN
10.0000 mg | Freq: Once | INTRAVENOUS | Status: AC
  Administered 2021-06-26: 10 mg via INTRAVENOUS
  Filled 2021-06-26: qty 10

## 2021-06-26 MED ORDER — SODIUM CHLORIDE 0.9 % IV SOLN
135.4500 mg | Freq: Once | INTRAVENOUS | Status: AC
  Administered 2021-06-26: 140 mg via INTRAVENOUS
  Filled 2021-06-26: qty 14

## 2021-06-26 MED ORDER — PACLITAXEL CHEMO INJECTION 300 MG/50ML
64.0000 mg/m2 | Freq: Once | INTRAVENOUS | Status: AC
  Administered 2021-06-26: 126 mg via INTRAVENOUS
  Filled 2021-06-26: qty 21

## 2021-06-26 NOTE — Patient Instructions (Signed)
[place]  Discharge Instructions: Thank you for choosing [facility] to provide your oncology and hematology care.  If you have a lab appointment with the [facility], please come in thru the Main Entrance and check in at the main information desk.  Wear comfortable clothing and clothing appropriate for easy access to any Portacath or PICC line.   We strive to give you quality time with your provider. You may need to reschedule your appointment if you arrive late (15 or more minutes).  Arriving late affects you and other patients whose appointments are after yours.  Also, if you miss three or more appointments without notifying the office, you may be dismissed from the clinic at the provider's discretion.      For prescription refill requests, have your pharmacy contact our office and allow 72 hours for refills to be completed.    Today you received the following chemotherapy and/or immunotherapy agents Paclitaxel and Carboplatin, return as scheduled.     To help prevent nausea and vomiting after your treatment, we encourage you to take your nausea medication as directed.  BELOW ARE SYMPTOMS THAT SHOULD BE REPORTED IMMEDIATELY: *FEVER GREATER THAN 100.4 F (38 C) OR HIGHER *CHILLS OR SWEATING *NAUSEA AND VOMITING THAT IS NOT CONTROLLED WITH YOUR NAUSEA MEDICATION *UNUSUAL SHORTNESS OF BREATH *UNUSUAL BRUISING OR BLEEDING *URINARY PROBLEMS (pain or burning when urinating, or frequent urination) *BOWEL PROBLEMS (unusual diarrhea, constipation, pain near the anus) TENDERNESS IN MOUTH AND THROAT WITH OR WITHOUT PRESENCE OF ULCERS (sore throat, sores in mouth, or a toothache) UNUSUAL RASH, SWELLING OR PAIN  UNUSUAL VAGINAL DISCHARGE OR ITCHING   Items with * indicate a potential emergency and should be followed up as soon as possible or go to the Emergency Department if any problems should occur.  Please show the CHEMOTHERAPY ALERT CARD or IMMUNOTHERAPY ALERT  CARD at check-in to the Emergency Department and triage nurse.  Should you have questions after your visit or need to cancel or reschedule your appointment, please contact [facility] [phone]  and follow the prompts.  Office hours are 8:00 a.m. to 4:30 p.m. Monday - Friday. Please note that voicemails left after 4:00 p.m. may not be returned until the following business day.  We are closed weekends and major holidays. You have access to a nurse at all times for urgent questions. Please call the main number to the clinic [phone] and follow the prompts.  For any non-urgent questions, you may also contact your provider using MyChart. We now offer e-Visits for anyone 36 and older to request care online for non-urgent symptoms. For details visit [email or web address].   Also download the MyChart app! Go to the app store, search "MyChart", open the app, select [facility], and log in with your MyChart username and password.  Due to Covid, a mask is required upon entering the hospital/clinic. If you do not have a mask, one will be given to you upon arrival. For doctor visits, patients may have 1 support person aged 62 or older with them. For treatment visits, patients cannot have anyone with them due to current Covid guidelines and our immunocompromised population.

## 2021-06-26 NOTE — Progress Notes (Signed)
Patient presents today for Carbo/Taxol. Okay for treatment per Dr. Delton Coombes.  Patient tolerated influenza vaccination with no complaints voiced. Site clean and dry with no bruising or swelling noted at site. See MAR for details. Band aid applied.  Patient stable during and after injection.  Patient tolerated chemotherapy with no complaints voiced. Side effects with management reviewed understanding verbalized. Port site clean and dry with no bruising or swelling noted at site. Good blood return noted before and after administration of chemotherapy. Band aid applied. Patient left in satisfactory condition with VSS and no s/s of distress noted.

## 2021-06-26 NOTE — Patient Instructions (Signed)
[place] at [facility] Discharge Instructions  You were seen and examined today by Dr. Delton Coombes. This will be the last of your weekly treatments. You will return next week to start Adriamycin, Cytoxan, and Keytruda every 3 weeks. You will receive a long-acting white blood cell booster shot the day after each of those treatments.   Thank you for choosing [place] at [facility] to provide your oncology and hematology care.  To afford each patient quality time with our provider, please arrive at least 15 minutes before your scheduled appointment time.   If you have a lab appointment with the [facility] please come in thru the Main Entrance and check in at the main information desk.  You need to re-schedule your appointment should you arrive 10 or more minutes late.  We strive to give you quality time with our providers, and arriving late affects you and other patients whose appointments are after yours.  Also, if you no show three or more times for appointments you may be dismissed from the clinic at the providers discretion.     Again, thank you for choosing [facility].  Our hope is that these requests will decrease the amount of time that you wait before being seen by our physicians.       _____________________________________________________________  Should you have questions after your visit to [facility], please contact our office at [phone] and follow the prompts.  Our office hours are 8:00 a.m. and 4:30 p.m. Monday - Friday.  Please note that voicemails left after 4:00 p.m. may not be returned until the following business day.  We are closed weekends and major holidays.  You do have access to a nurse 24-7, just call the main number to the clinic [phone] and do not press any options, hold on the line and a nurse will answer the phone.    For prescription refill requests, have your pharmacy contact  our office and allow 72 hours.    Due to Covid, you will need to wear a mask upon entering the hospital. If you do not have a mask, a mask will be given to you at the Main Entrance upon arrival. For doctor visits, patients may have 1 support person age 34 or older with them. For treatment visits, patients can not have anyone with them due to social distancing guidelines and our immunocompromised population.

## 2021-06-26 NOTE — Progress Notes (Signed)
Patient has been examined, vital signs and labs have been reviewed by Dr. Katragadda. ANC, Creatinine, LFTs, hemoglobin, and platelets are within treatment parameters per Dr. Katragadda. Patient may proceed with treatment per M.D.   

## 2021-06-27 ENCOUNTER — Ambulatory Visit (HOSPITAL_COMMUNITY): Payer: Medicare Other

## 2021-06-27 ENCOUNTER — Encounter (HOSPITAL_COMMUNITY): Payer: Self-pay | Admitting: Hematology

## 2021-06-30 DIAGNOSIS — E038 Other specified hypothyroidism: Secondary | ICD-10-CM | POA: Diagnosis not present

## 2021-06-30 DIAGNOSIS — I10 Essential (primary) hypertension: Secondary | ICD-10-CM | POA: Diagnosis not present

## 2021-06-30 DIAGNOSIS — J45909 Unspecified asthma, uncomplicated: Secondary | ICD-10-CM | POA: Diagnosis not present

## 2021-06-30 DIAGNOSIS — E785 Hyperlipidemia, unspecified: Secondary | ICD-10-CM | POA: Diagnosis not present

## 2021-06-30 DIAGNOSIS — I700 Atherosclerosis of aorta: Secondary | ICD-10-CM | POA: Diagnosis not present

## 2021-07-03 ENCOUNTER — Inpatient Hospital Stay (HOSPITAL_COMMUNITY): Payer: Medicare Other

## 2021-07-03 ENCOUNTER — Encounter (HOSPITAL_COMMUNITY): Payer: Self-pay

## 2021-07-03 ENCOUNTER — Other Ambulatory Visit: Payer: Self-pay

## 2021-07-03 VITALS — BP 109/72 | HR 73 | Temp 99.1°F | Resp 18 | Wt 186.0 lb

## 2021-07-03 DIAGNOSIS — C50912 Malignant neoplasm of unspecified site of left female breast: Secondary | ICD-10-CM

## 2021-07-03 DIAGNOSIS — Z23 Encounter for immunization: Secondary | ICD-10-CM | POA: Diagnosis not present

## 2021-07-03 DIAGNOSIS — Z5112 Encounter for antineoplastic immunotherapy: Secondary | ICD-10-CM | POA: Diagnosis not present

## 2021-07-03 DIAGNOSIS — Z95828 Presence of other vascular implants and grafts: Secondary | ICD-10-CM

## 2021-07-03 DIAGNOSIS — Z5111 Encounter for antineoplastic chemotherapy: Secondary | ICD-10-CM | POA: Diagnosis not present

## 2021-07-03 DIAGNOSIS — R079 Chest pain, unspecified: Secondary | ICD-10-CM | POA: Diagnosis not present

## 2021-07-03 DIAGNOSIS — D702 Other drug-induced agranulocytosis: Secondary | ICD-10-CM

## 2021-07-03 DIAGNOSIS — S62102D Fracture of unspecified carpal bone, left wrist, subsequent encounter for fracture with routine healing: Secondary | ICD-10-CM | POA: Diagnosis not present

## 2021-07-03 LAB — COMPREHENSIVE METABOLIC PANEL
ALT: 15 U/L (ref 0–44)
AST: 15 U/L (ref 15–41)
Albumin: 3.4 g/dL — ABNORMAL LOW (ref 3.5–5.0)
Alkaline Phosphatase: 64 U/L (ref 38–126)
Anion gap: 4 — ABNORMAL LOW (ref 5–15)
BUN: 16 mg/dL (ref 8–23)
CO2: 24 mmol/L (ref 22–32)
Calcium: 8.4 mg/dL — ABNORMAL LOW (ref 8.9–10.3)
Chloride: 106 mmol/L (ref 98–111)
Creatinine, Ser: 0.68 mg/dL (ref 0.44–1.00)
GFR, Estimated: >60 mL/min (ref >60)
Glucose, Bld: 95 mg/dL (ref 70–99)
Potassium: 4.2 mmol/L (ref 3.5–5.1)
Sodium: 134 mmol/L — ABNORMAL LOW (ref 135–145)
Total Bilirubin: 0.6 mg/dL (ref 0.3–1.2)
Total Protein: 6.7 g/dL (ref 6.5–8.1)

## 2021-07-03 LAB — CBC WITH DIFFERENTIAL/PLATELET
Abs Immature Granulocytes: 0 10*3/uL (ref 0.00–0.07)
Basophils Absolute: 0 10*3/uL (ref 0.0–0.1)
Basophils Relative: 1 %
Eosinophils Absolute: 0.1 10*3/uL (ref 0.0–0.5)
Eosinophils Relative: 6 %
HCT: 28.8 % — ABNORMAL LOW (ref 36.0–46.0)
Hemoglobin: 9.4 g/dL — ABNORMAL LOW (ref 12.0–15.0)
Immature Granulocytes: 0 %
Lymphocytes Relative: 41 %
Lymphs Abs: 1 10*3/uL (ref 0.7–4.0)
MCH: 34.9 pg — ABNORMAL HIGH (ref 26.0–34.0)
MCHC: 32.6 g/dL (ref 30.0–36.0)
MCV: 107.1 fL — ABNORMAL HIGH (ref 80.0–100.0)
Monocytes Absolute: 0.2 10*3/uL (ref 0.1–1.0)
Monocytes Relative: 8 %
Neutro Abs: 1.1 10*3/uL — ABNORMAL LOW (ref 1.7–7.7)
Neutrophils Relative %: 44 %
Platelets: 226 10*3/uL (ref 150–400)
RBC: 2.69 MIL/uL — ABNORMAL LOW (ref 3.87–5.11)
RDW: 14.5 % (ref 11.5–15.5)
WBC: 2.4 10*3/uL — ABNORMAL LOW (ref 4.0–10.5)
nRBC: 0 % (ref 0.0–0.2)

## 2021-07-03 LAB — MAGNESIUM: Magnesium: 1.7 mg/dL (ref 1.7–2.4)

## 2021-07-03 MED ORDER — HEPARIN SOD (PORK) LOCK FLUSH 100 UNIT/ML IV SOLN
500.0000 [IU] | Freq: Once | INTRAVENOUS | Status: AC
  Administered 2021-07-03: 500 [IU] via INTRAVENOUS

## 2021-07-03 MED ORDER — SODIUM CHLORIDE 0.9% FLUSH
10.0000 mL | Freq: Once | INTRAVENOUS | Status: AC
Start: 2021-07-03 — End: 2021-07-03
  Administered 2021-07-03: 10 mL via INTRAVENOUS

## 2021-07-03 MED ORDER — FILGRASTIM-SNDZ 480 MCG/0.8ML IJ SOSY
480.0000 ug | PREFILLED_SYRINGE | Freq: Once | INTRAMUSCULAR | Status: AC
  Administered 2021-07-03: 480 ug via SUBCUTANEOUS
  Filled 2021-07-03: qty 0.8

## 2021-07-03 NOTE — Patient Instructions (Signed)
[facility]  Discharge Instructions: Thank you for choosing [facility] to provide your oncology and hematology care.  If you have a lab appointment with the [facility], please come in thru the Main Entrance and check in at the main information desk.  Wear comfortable clothing and clothing appropriate for easy access to any Portacath or PICC line.   We strive to give you quality time with your provider. You may need to reschedule your appointment if you arrive late (15 or more minutes).  Arriving late affects you and other patients whose appointments are after yours.  Also, if you miss three or more appointments without notifying the office, you may be dismissed from the clinic at the provider's discretion.      For prescription refill requests, have your pharmacy contact our office and allow 72 hours for refills to be completed.    Today you received the following Zarxio injection, return as scheduled.   To help prevent nausea and vomiting after your treatment, we encourage you to take your nausea medication as directed.  BELOW ARE SYMPTOMS THAT SHOULD BE REPORTED IMMEDIATELY: *FEVER GREATER THAN 100.4 F (38 C) OR HIGHER *CHILLS OR SWEATING *NAUSEA AND VOMITING THAT IS NOT CONTROLLED WITH YOUR NAUSEA MEDICATION *UNUSUAL SHORTNESS OF BREATH *UNUSUAL BRUISING OR BLEEDING *URINARY PROBLEMS (pain or burning when urinating, or frequent urination) *BOWEL PROBLEMS (unusual diarrhea, constipation, pain near the anus) TENDERNESS IN MOUTH AND THROAT WITH OR WITHOUT PRESENCE OF ULCERS (sore throat, sores in mouth, or a toothache) UNUSUAL RASH, SWELLING OR PAIN  UNUSUAL VAGINAL DISCHARGE OR ITCHING   Items with * indicate a potential emergency and should be followed up as soon as possible or go to the Emergency Department if any problems should occur.  Please show the CHEMOTHERAPY ALERT CARD or IMMUNOTHERAPY ALERT CARD at check-in to the Emergency Department and triage  nurse.  Should you have questions after your visit or need to cancel or reschedule your appointment, please contact [facility] [phone]  and follow the prompts.  Office hours are 8:00 a.m. to 4:30 p.m. Monday - Friday. Please note that voicemails left after 4:00 p.m. may not be returned until the following business day.  We are closed weekends and major holidays. You have access to a nurse at all times for urgent questions. Please call the main number to the clinic [phone] and follow the prompts.  For any non-urgent questions, you may also contact your provider using MyChart. We now offer e-Visits for anyone 18 and older to request care online for non-urgent symptoms. For details visit [email or web address].   Also download the MyChart app! Go to the app store, search "MyChart", open the app, select [facility], and log in with your MyChart username and password.  Due to Covid, a mask is required upon entering the hospital/clinic. If you do not have a mask, one will be given to you upon arrival. For doctor visits, patients may have 1 support person aged 72 or older with them. For treatment visits, patients cannot have anyone with them due to current Covid guidelines and our immunocompromised population.

## 2021-07-03 NOTE — Progress Notes (Signed)
Patient presents today for treatment, ANC 1.1 no treatment today per Dr. Delton Coombes. Received verbal orders for Zarxio injection. Patient will return tomorrow for repeat labs.  Port flushed with good blood return noted. No bruising or swelling at site.  Patient tolerated injection with no complaints voiced. Site clean and dry with no bruising or swelling noted at site. See MAR for details. Band aid applied.  Patient stable during and after injection. VSS with discharge and left in satisfactory condition with no s/s of distress noted.

## 2021-07-04 ENCOUNTER — Inpatient Hospital Stay (HOSPITAL_COMMUNITY): Payer: Medicare Other

## 2021-07-04 ENCOUNTER — Ambulatory Visit (HOSPITAL_COMMUNITY): Payer: Medicare Other

## 2021-07-04 VITALS — BP 119/69 | HR 69 | Temp 98.4°F | Resp 18 | Wt 184.7 lb

## 2021-07-04 DIAGNOSIS — Z95828 Presence of other vascular implants and grafts: Secondary | ICD-10-CM

## 2021-07-04 DIAGNOSIS — C50912 Malignant neoplasm of unspecified site of left female breast: Secondary | ICD-10-CM

## 2021-07-04 DIAGNOSIS — Z5111 Encounter for antineoplastic chemotherapy: Secondary | ICD-10-CM | POA: Diagnosis not present

## 2021-07-04 DIAGNOSIS — S62102D Fracture of unspecified carpal bone, left wrist, subsequent encounter for fracture with routine healing: Secondary | ICD-10-CM | POA: Diagnosis not present

## 2021-07-04 DIAGNOSIS — Z5112 Encounter for antineoplastic immunotherapy: Secondary | ICD-10-CM | POA: Diagnosis not present

## 2021-07-04 DIAGNOSIS — R079 Chest pain, unspecified: Secondary | ICD-10-CM | POA: Diagnosis not present

## 2021-07-04 DIAGNOSIS — Z23 Encounter for immunization: Secondary | ICD-10-CM | POA: Diagnosis not present

## 2021-07-04 LAB — CBC WITH DIFFERENTIAL/PLATELET
Band Neutrophils: 11 %
Basophils Absolute: 0 10*3/uL (ref 0.0–0.1)
Basophils Relative: 0 %
Eosinophils Absolute: 0 10*3/uL (ref 0.0–0.5)
Eosinophils Relative: 0 %
HCT: 28.9 % — ABNORMAL LOW (ref 36.0–46.0)
Hemoglobin: 9.4 g/dL — ABNORMAL LOW (ref 12.0–15.0)
Lymphocytes Relative: 24 %
Lymphs Abs: 1.8 10*3/uL (ref 0.7–4.0)
MCH: 34.9 pg — ABNORMAL HIGH (ref 26.0–34.0)
MCHC: 32.5 g/dL (ref 30.0–36.0)
MCV: 107.4 fL — ABNORMAL HIGH (ref 80.0–100.0)
Monocytes Absolute: 0.3 10*3/uL (ref 0.1–1.0)
Monocytes Relative: 4 %
Neutro Abs: 5.5 10*3/uL (ref 1.7–7.7)
Neutrophils Relative %: 61 %
Platelets: 228 10*3/uL (ref 150–400)
RBC: 2.69 MIL/uL — ABNORMAL LOW (ref 3.87–5.11)
RDW: 14.7 % (ref 11.5–15.5)
WBC: 7.6 10*3/uL (ref 4.0–10.5)
nRBC: 0 % (ref 0.0–0.2)

## 2021-07-04 MED ORDER — SODIUM CHLORIDE 0.9 % IV SOLN
150.0000 mg | Freq: Once | INTRAVENOUS | Status: AC
  Administered 2021-07-04: 150 mg via INTRAVENOUS
  Filled 2021-07-04: qty 150

## 2021-07-04 MED ORDER — HEPARIN SOD (PORK) LOCK FLUSH 100 UNIT/ML IV SOLN
500.0000 [IU] | Freq: Once | INTRAVENOUS | Status: AC | PRN
  Administered 2021-07-04: 500 [IU]

## 2021-07-04 MED ORDER — SODIUM CHLORIDE 0.9 % IV SOLN
200.0000 mg | Freq: Once | INTRAVENOUS | Status: AC
  Administered 2021-07-04: 200 mg via INTRAVENOUS
  Filled 2021-07-04: qty 8

## 2021-07-04 MED ORDER — PALONOSETRON HCL INJECTION 0.25 MG/5ML
0.2500 mg | Freq: Once | INTRAVENOUS | Status: AC
  Administered 2021-07-04: 0.25 mg via INTRAVENOUS
  Filled 2021-07-04: qty 5

## 2021-07-04 MED ORDER — DOXORUBICIN HCL CHEMO IV INJECTION 2 MG/ML
60.0000 mg/m2 | Freq: Once | INTRAVENOUS | Status: AC
  Administered 2021-07-04: 116 mg via INTRAVENOUS
  Filled 2021-07-04: qty 58

## 2021-07-04 MED ORDER — SODIUM CHLORIDE 0.9 % IV SOLN
10.0000 mg | Freq: Once | INTRAVENOUS | Status: AC
  Administered 2021-07-04: 10 mg via INTRAVENOUS
  Filled 2021-07-04: qty 10

## 2021-07-04 MED ORDER — SODIUM CHLORIDE 0.9% FLUSH
10.0000 mL | INTRAVENOUS | Status: DC | PRN
Start: 2021-07-04 — End: 2021-07-04
  Administered 2021-07-04: 10 mL

## 2021-07-04 MED ORDER — CYCLOPHOSPHAMIDE CHEMO INJECTION 1 GM
600.0000 mg/m2 | Freq: Once | INTRAMUSCULAR | Status: AC
  Administered 2021-07-04: 1160 mg via INTRAVENOUS
  Filled 2021-07-04: qty 58

## 2021-07-04 MED ORDER — SODIUM CHLORIDE 0.9 % IV SOLN: Freq: Once | INTRAVENOUS | Status: AC

## 2021-07-04 NOTE — Progress Notes (Signed)
Chaplain engaged in a follow-up visit with Denise [name].  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  Denise [name] also shared about how life was when she first got married and how beautiful it was to meet, Denise [name], and move from her home with her husband.  She also shared about the beauty of being a grandma and being there for her grandchildren.  Chaplain offered listening, support, and presence.  Chaplain is able to follow-up as needed.    07/04/21 1100  Clinical Encounter Type  Visited With Patient  Visit Type Follow-up

## 2021-07-04 NOTE — Patient Instructions (Signed)
[facility]  Discharge Instructions: Thank you for choosing [facility] to provide your oncology and hematology care.  If you have a lab appointment with the Cancer Center, please come in thru the Main Entrance and check in at the main information desk.  Wear comfortable clothing and clothing appropriate for easy access to any Portacath or PICC line.   We strive to give you quality time with your provider. You may need to reschedule your appointment if you arrive late (15 or more minutes).  Arriving late affects you and other patients whose appointments are after yours.  Also, if you miss three or more appointments without notifying the office, you may be dismissed from the clinic at the provider's discretion.      For prescription refill requests, have your pharmacy contact our office and allow 72 hours for refills to be completed.        To help prevent nausea and vomiting after your treatment, we encourage you to take your nausea medication as directed.  BELOW ARE SYMPTOMS THAT SHOULD BE REPORTED IMMEDIATELY: *FEVER GREATER THAN 100.4 F (38 C) OR HIGHER *CHILLS OR SWEATING *NAUSEA AND VOMITING THAT IS NOT CONTROLLED WITH YOUR NAUSEA MEDICATION *UNUSUAL SHORTNESS OF BREATH *UNUSUAL BRUISING OR BLEEDING *URINARY PROBLEMS (pain or burning when urinating, or frequent urination) *BOWEL PROBLEMS (unusual diarrhea, constipation, pain near the anus) TENDERNESS IN MOUTH AND THROAT WITH OR WITHOUT PRESENCE OF ULCERS (sore throat, sores in mouth, or a toothache) UNUSUAL RASH, SWELLING OR PAIN  UNUSUAL VAGINAL DISCHARGE OR ITCHING   Items with * indicate a potential emergency and should be followed up as soon as possible or go to the Emergency Department if any problems should occur.  Please show the CHEMOTHERAPY ALERT CARD or IMMUNOTHERAPY ALERT CARD at check-in to the Emergency Department and triage nurse.  Should you have questions after your visit or need to cancel  or reschedule your appointment, please contact [facility] [phone]  and follow the prompts.  Office hours are 8:00 a.m. to 4:30 p.m. Monday - Friday. Please note that voicemails left after 4:00 p.m. may not be returned until the following business day.  We are closed weekends and major holidays. You have access to a nurse at all times for urgent questions. Please call the main number to the clinic [phone] and follow the prompts.  For any non-urgent questions, you may also contact your provider using MyChart. We now offer e-Visits for anyone 18 and older to request care online for non-urgent symptoms. For details visit [email or web address].   Also download the MyChart app! Go to the app store, search "MyChart", open the app, select [facility], and log in with your MyChart username and password.  Due to Covid, a mask is required upon entering the hospital/clinic. If you do not have a mask, one will be given to you upon arrival. For doctor visits, patients may have 1 support person aged 18 or older with them. For treatment visits, patients cannot have anyone with them due to current Covid guidelines and our immunocompromised population.  

## 2021-07-04 NOTE — Progress Notes (Signed)
Patient presents today for chemotherapy infusion.  Patient is in satisfactory condition.  Patient complains of reflux today that is worsening.  MD notified and patient was advised to start Prilosec 20 mg daily per MD orders.  Labs reviewed and all are within treatment parameters.   ANC today is 5.5.  We will proceed with treatment per MD orders.  Patient tolerated treatment well with no complaints voiced.  Patient left ambulatory in stable condition.  Vital signs stable at discharge.  Follow up as scheduled.

## 2021-07-04 NOTE — Progress Notes (Signed)
Patients port flushed without difficulty.  Good blood return noted with no bruising or swelling noted at site.  Stable during access and blood draw.  Patient to remain accessed for treatment. 

## 2021-07-07 ENCOUNTER — Inpatient Hospital Stay (HOSPITAL_COMMUNITY): Payer: Medicare Other

## 2021-07-07 ENCOUNTER — Other Ambulatory Visit: Payer: Self-pay

## 2021-07-07 VITALS — BP 125/76 | HR 80 | Temp 97.0°F | Resp 18 | Wt 182.6 lb

## 2021-07-07 DIAGNOSIS — C50912 Malignant neoplasm of unspecified site of left female breast: Secondary | ICD-10-CM | POA: Diagnosis not present

## 2021-07-07 DIAGNOSIS — S62102D Fracture of unspecified carpal bone, left wrist, subsequent encounter for fracture with routine healing: Secondary | ICD-10-CM | POA: Diagnosis not present

## 2021-07-07 DIAGNOSIS — Z95828 Presence of other vascular implants and grafts: Secondary | ICD-10-CM

## 2021-07-07 DIAGNOSIS — R079 Chest pain, unspecified: Secondary | ICD-10-CM | POA: Diagnosis not present

## 2021-07-07 DIAGNOSIS — Z5112 Encounter for antineoplastic immunotherapy: Secondary | ICD-10-CM | POA: Diagnosis not present

## 2021-07-07 DIAGNOSIS — Z23 Encounter for immunization: Secondary | ICD-10-CM | POA: Diagnosis not present

## 2021-07-07 DIAGNOSIS — Z5111 Encounter for antineoplastic chemotherapy: Secondary | ICD-10-CM | POA: Diagnosis not present

## 2021-07-07 MED ORDER — PEGFILGRASTIM-JMDB 6 MG/0.6ML ~~LOC~~ SOSY
6.0000 mg | PREFILLED_SYRINGE | Freq: Once | SUBCUTANEOUS | Status: AC
  Administered 2021-07-07: 6 mg via SUBCUTANEOUS

## 2021-07-07 NOTE — Progress Notes (Signed)
Denise [name] presents today for injection per MD orders. Fulphila injection administered SQ in left arm. Administration without incident. Patient tolerated well.  Vitals stable and discharged home from clinic ambulatory. Follow up as scheduled.

## 2021-07-07 NOTE — Patient Instructions (Signed)
[facility]  Discharge Instructions: Thank you for choosing [facility] to provide your oncology and hematology care.  If you have a lab appointment with the Cancer Center, please come in thru the Main Entrance and check in at the main information desk.  Wear comfortable clothing and clothing appropriate for easy access to any Portacath or PICC line.   We strive to give you quality time with your provider. You may need to reschedule your appointment if you arrive late (15 or more minutes).  Arriving late affects you and other patients whose appointments are after yours.  Also, if you miss three or more appointments without notifying the office, you may be dismissed from the clinic at the provider's discretion.      For prescription refill requests, have your pharmacy contact our office and allow 72 hours for refills to be completed.        To help prevent nausea and vomiting after your treatment, we encourage you to take your nausea medication as directed.  BELOW ARE SYMPTOMS THAT SHOULD BE REPORTED IMMEDIATELY: *FEVER GREATER THAN 100.4 F (38 C) OR HIGHER *CHILLS OR SWEATING *NAUSEA AND VOMITING THAT IS NOT CONTROLLED WITH YOUR NAUSEA MEDICATION *UNUSUAL SHORTNESS OF BREATH *UNUSUAL BRUISING OR BLEEDING *URINARY PROBLEMS (pain or burning when urinating, or frequent urination) *BOWEL PROBLEMS (unusual diarrhea, constipation, pain near the anus) TENDERNESS IN MOUTH AND THROAT WITH OR WITHOUT PRESENCE OF ULCERS (sore throat, sores in mouth, or a toothache) UNUSUAL RASH, SWELLING OR PAIN  UNUSUAL VAGINAL DISCHARGE OR ITCHING   Items with * indicate a potential emergency and should be followed up as soon as possible or go to the Emergency Department if any problems should occur.  Please show the CHEMOTHERAPY ALERT CARD or IMMUNOTHERAPY ALERT CARD at check-in to the Emergency Department and triage nurse.  Should you have questions after your visit or need to cancel  or reschedule your appointment, please contact [facility] [phone]  and follow the prompts.  Office hours are 8:00 a.m. to 4:30 p.m. Monday - Friday. Please note that voicemails left after 4:00 p.m. may not be returned until the following business day.  We are closed weekends and major holidays. You have access to a nurse at all times for urgent questions. Please call the main number to the clinic [phone] and follow the prompts.  For any non-urgent questions, you may also contact your provider using MyChart. We now offer e-Visits for anyone 18 and older to request care online for non-urgent symptoms. For details visit [email or web address].   Also download the MyChart app! Go to the app store, search "MyChart", open the app, select [facility], and log in with your MyChart username and password.  Due to Covid, a mask is required upon entering the hospital/clinic. If you do not have a mask, one will be given to you upon arrival. For doctor visits, patients may have 1 support person aged 18 or older with them. For treatment visits, patients cannot have anyone with them due to current Covid guidelines and our immunocompromised population.  

## 2021-07-11 ENCOUNTER — Other Ambulatory Visit: Payer: Self-pay

## 2021-07-11 ENCOUNTER — Emergency Department (HOSPITAL_COMMUNITY): Payer: Medicare Other

## 2021-07-11 ENCOUNTER — Encounter (HOSPITAL_COMMUNITY): Payer: Self-pay | Admitting: *Deleted

## 2021-07-11 ENCOUNTER — Telehealth (HOSPITAL_COMMUNITY): Payer: Self-pay | Admitting: *Deleted

## 2021-07-11 ENCOUNTER — Inpatient Hospital Stay (HOSPITAL_COMMUNITY)
Admission: EM | Admit: 2021-07-11 | Discharge: 2021-07-11 | DRG: 809 | Discharge status: Against Medical Advice | Payer: Medicare Other | Attending: Internal Medicine | Admitting: Internal Medicine

## 2021-07-11 DIAGNOSIS — E871 Hypo-osmolality and hyponatremia: Secondary | ICD-10-CM | POA: Diagnosis present

## 2021-07-11 DIAGNOSIS — T451X5A Adverse effect of antineoplastic and immunosuppressive drugs, initial encounter: Secondary | ICD-10-CM | POA: Diagnosis present

## 2021-07-11 DIAGNOSIS — D702 Other drug-induced agranulocytosis: Secondary | ICD-10-CM | POA: Diagnosis present

## 2021-07-11 DIAGNOSIS — Z20822 Contact with and (suspected) exposure to covid-19: Secondary | ICD-10-CM | POA: Diagnosis present

## 2021-07-11 DIAGNOSIS — D61818 Other pancytopenia: Secondary | ICD-10-CM | POA: Diagnosis not present

## 2021-07-11 DIAGNOSIS — D709 Neutropenia, unspecified: Secondary | ICD-10-CM | POA: Diagnosis not present

## 2021-07-11 DIAGNOSIS — E039 Hypothyroidism, unspecified: Secondary | ICD-10-CM | POA: Diagnosis present

## 2021-07-11 DIAGNOSIS — I517 Cardiomegaly: Secondary | ICD-10-CM | POA: Diagnosis not present

## 2021-07-11 DIAGNOSIS — Z5321 Procedure and treatment not carried out due to patient leaving prior to being seen by health care provider: Secondary | ICD-10-CM | POA: Diagnosis not present

## 2021-07-11 DIAGNOSIS — R0602 Shortness of breath: Secondary | ICD-10-CM

## 2021-07-11 DIAGNOSIS — R079 Chest pain, unspecified: Secondary | ICD-10-CM | POA: Diagnosis not present

## 2021-07-11 DIAGNOSIS — C50912 Malignant neoplasm of unspecified site of left female breast: Secondary | ICD-10-CM | POA: Diagnosis present

## 2021-07-11 DIAGNOSIS — R509 Fever, unspecified: Secondary | ICD-10-CM | POA: Diagnosis not present

## 2021-07-11 DIAGNOSIS — Z79890 Hormone replacement therapy (postmenopausal): Secondary | ICD-10-CM | POA: Diagnosis not present

## 2021-07-11 DIAGNOSIS — D703 Neutropenia due to infection: Secondary | ICD-10-CM | POA: Diagnosis not present

## 2021-07-11 DIAGNOSIS — I10 Essential (primary) hypertension: Secondary | ICD-10-CM | POA: Diagnosis present

## 2021-07-11 DIAGNOSIS — R911 Solitary pulmonary nodule: Secondary | ICD-10-CM | POA: Diagnosis not present

## 2021-07-11 DIAGNOSIS — D61810 Antineoplastic chemotherapy induced pancytopenia: Principal | ICD-10-CM | POA: Diagnosis present

## 2021-07-11 DIAGNOSIS — D849 Immunodeficiency, unspecified: Secondary | ICD-10-CM | POA: Diagnosis present

## 2021-07-11 DIAGNOSIS — D84821 Immunodeficiency due to drugs: Secondary | ICD-10-CM | POA: Diagnosis present

## 2021-07-11 DIAGNOSIS — R5081 Fever presenting with conditions classified elsewhere: Secondary | ICD-10-CM | POA: Diagnosis not present

## 2021-07-11 DIAGNOSIS — D539 Nutritional anemia, unspecified: Secondary | ICD-10-CM | POA: Diagnosis present

## 2021-07-11 LAB — CBC WITH DIFFERENTIAL/PLATELET
Abs Immature Granulocytes: 0 10*3/uL (ref 0.00–0.07)
Basophils Absolute: 0 10*3/uL (ref 0.0–0.1)
Basophils Relative: 3 %
Eosinophils Absolute: 0 10*3/uL (ref 0.0–0.5)
Eosinophils Relative: 6 %
HCT: 25 % — ABNORMAL LOW (ref 36.0–46.0)
Hemoglobin: 8.6 g/dL — ABNORMAL LOW (ref 12.0–15.0)
Immature Granulocytes: 0 %
Lymphocytes Relative: 85 %
Lymphs Abs: 0.3 10*3/uL — ABNORMAL LOW (ref 0.7–4.0)
MCH: 35.4 pg — ABNORMAL HIGH (ref 26.0–34.0)
MCHC: 34.4 g/dL (ref 30.0–36.0)
MCV: 102.9 fL — ABNORMAL HIGH (ref 80.0–100.0)
Monocytes Absolute: 0 10*3/uL — ABNORMAL LOW (ref 0.1–1.0)
Monocytes Relative: 6 %
Neutro Abs: 0 10*3/uL — CL (ref 1.7–7.7)
Neutrophils Relative %: 0 %
Platelets: 109 10*3/uL — ABNORMAL LOW (ref 150–400)
RBC: 2.43 MIL/uL — ABNORMAL LOW (ref 3.87–5.11)
RDW: 13 % (ref 11.5–15.5)
WBC: 0.3 10*3/uL — CL (ref 4.0–10.5)
nRBC: 0 % (ref 0.0–0.2)

## 2021-07-11 LAB — COMPREHENSIVE METABOLIC PANEL
ALT: 19 U/L (ref 0–44)
AST: 17 U/L (ref 15–41)
Albumin: 3.6 g/dL (ref 3.5–5.0)
Alkaline Phosphatase: 73 U/L (ref 38–126)
Anion gap: 9 (ref 5–15)
BUN: 17 mg/dL (ref 8–23)
CO2: 23 mmol/L (ref 22–32)
Calcium: 8.7 mg/dL — ABNORMAL LOW (ref 8.9–10.3)
Chloride: 99 mmol/L (ref 98–111)
Creatinine, Ser: 0.79 mg/dL (ref 0.44–1.00)
GFR, Estimated: >60 mL/min (ref >60)
Glucose, Bld: 111 mg/dL — ABNORMAL HIGH (ref 70–99)
Potassium: 3.7 mmol/L (ref 3.5–5.1)
Sodium: 131 mmol/L — ABNORMAL LOW (ref 135–145)
Total Bilirubin: 0.6 mg/dL (ref 0.3–1.2)
Total Protein: 7.4 g/dL (ref 6.5–8.1)

## 2021-07-11 LAB — PROTIME-INR
INR: 1.2 (ref 0.8–1.2)
INR: 1.3 — ABNORMAL HIGH (ref 0.8–1.2)
Prothrombin Time: 15 seconds (ref 11.4–15.2)
Prothrombin Time: 15.8 seconds — ABNORMAL HIGH (ref 11.4–15.2)

## 2021-07-11 LAB — BLOOD GAS, VENOUS
Acid-base deficit: 2.1 mmol/L — ABNORMAL HIGH (ref 0.0–2.0)
Bicarbonate: 22.8 mmol/L (ref 20.0–28.0)
Drawn by: 6246
FIO2: 21
O2 Saturation: 95.2 %
Patient temperature: 36.4
pCO2, Ven: 32.3 mmHg — ABNORMAL LOW (ref 44.0–60.0)
pH, Ven: 7.438 — ABNORMAL HIGH (ref 7.250–7.430)
pO2, Ven: 74.8 mmHg — ABNORMAL HIGH (ref 32.0–45.0)

## 2021-07-11 LAB — APTT: aPTT: 30 seconds (ref 24–36)

## 2021-07-11 LAB — RESP PANEL BY RT-PCR (FLU A&B, COVID) ARPGX2
Influenza A by PCR: NEGATIVE
Influenza B by PCR: NEGATIVE
SARS Coronavirus 2 by RT PCR: NEGATIVE

## 2021-07-11 LAB — LACTIC ACID, PLASMA
Lactic Acid, Venous: 0.7 mmol/L (ref 0.5–1.9)
Lactic Acid, Venous: 0.9 mmol/L (ref 0.5–1.9)

## 2021-07-11 IMAGING — DX DG CHEST 1V PORT
1 series · 1 of 1 positions shown · non-contrast
Comparison: Prior chest radiographs [DATE] and earlier.

CLINICAL DATA: Shortness of breath. Additional history provided:
Patient with a history of breast cancer, inability to eat due to
vomiting, shortness of breath.

EXAM:
PORTABLE CHEST 1 VIEW

[chest ap]
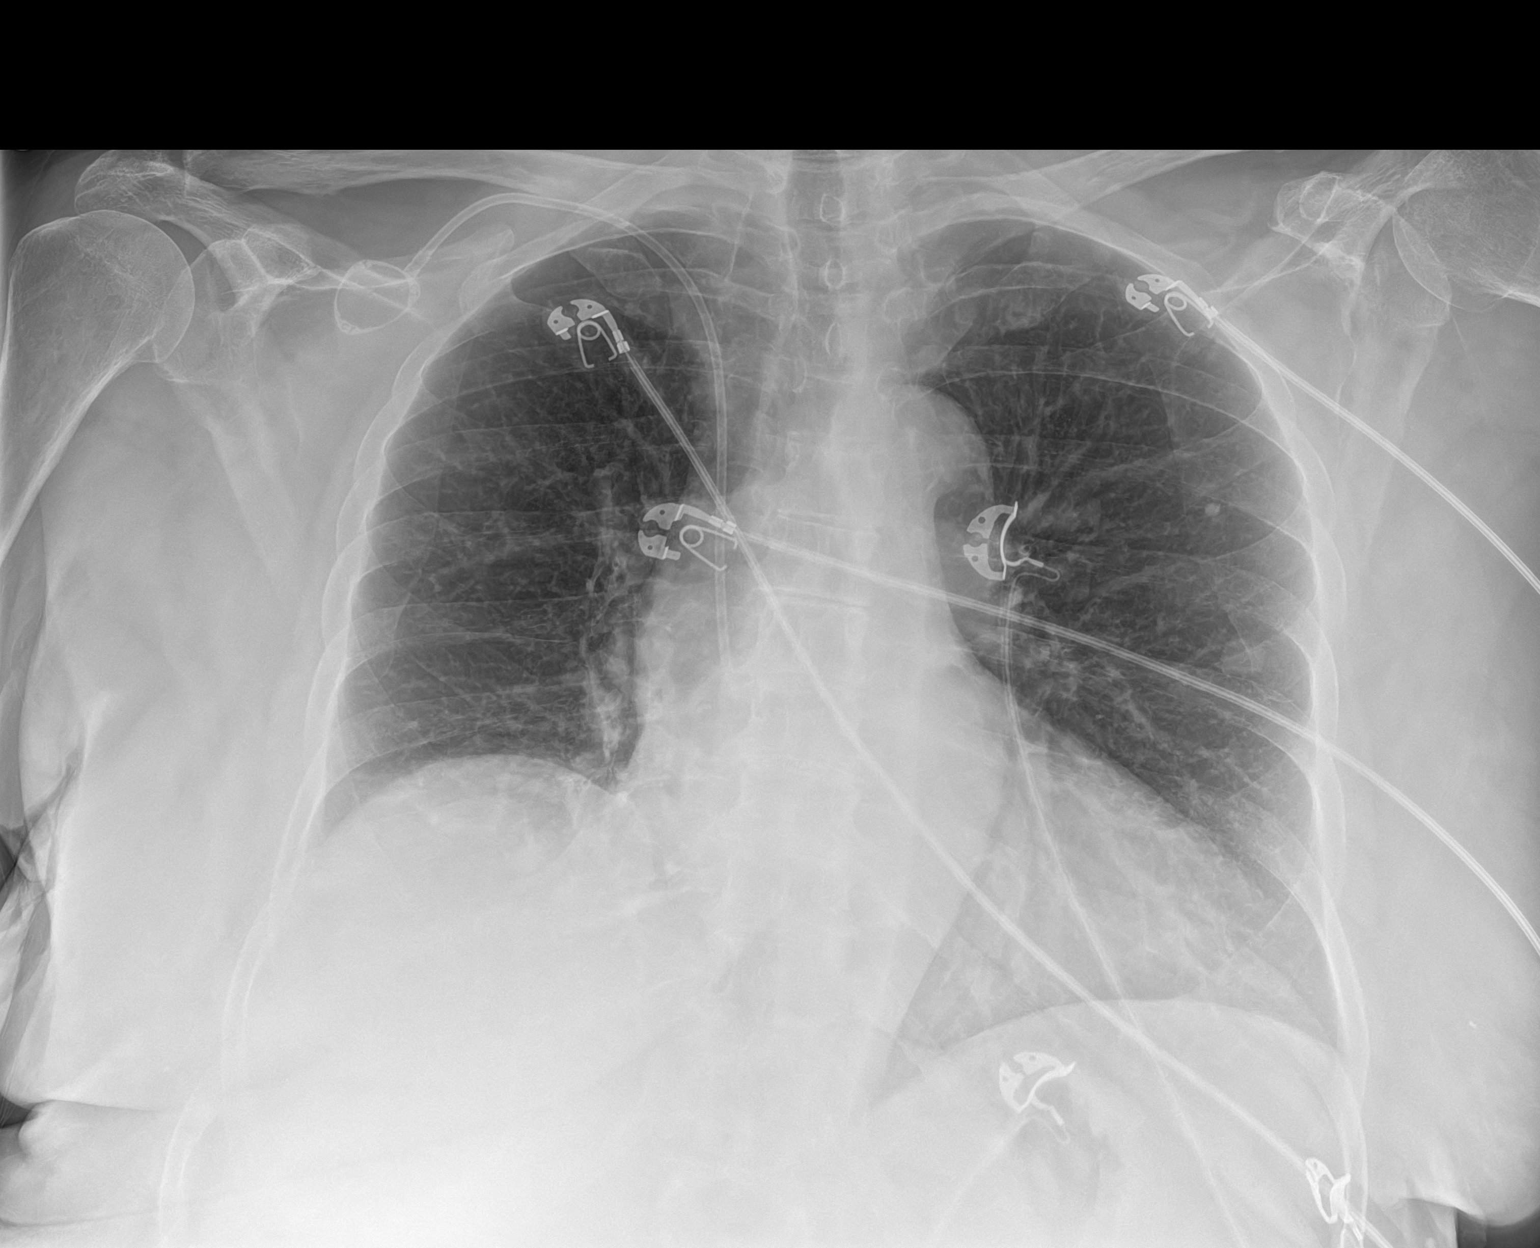

[1 of 1 positions shown; findings below may reference images not displayed]

FINDINGS: Redemonstrated right chest infusion port catheter with tip
projecting at the level of the superior cavoatrial junction. Mild
cardiomegaly, unchanged. Aortic atherosclerosis. Redemonstrated
small calcified granuloma within the left upper lobe. Grossly
unchanged nodular opacity within the left mid-lung field,
corresponding with a known left lower lobe pulmonary nodule.
Otherwise, there is no appreciable airspace consolidation. No
evidence of pleural effusion or pneumothorax. As before, there is
elevation of the right hemidiaphragm. No displaced fracture.
Degenerative changes of the spine.
IMPRESSION: No evidence of acute cardiopulmonary abnormality.

Grossly unchanged left lower lobe pulmonary nodule.

Mild cardiomegaly, also unchanged.

Aortic Atherosclerosis ([AW]-[AW]).

## 2021-07-11 MED ORDER — LACTATED RINGERS IV SOLN: INTRAVENOUS | Status: DC

## 2021-07-11 MED ORDER — SODIUM CHLORIDE 0.9 % IV SOLN
2.0000 g | Freq: Three times a day (TID) | INTRAVENOUS | Status: DC
  Administered 2021-07-11: 2 g via INTRAVENOUS
  Filled 2021-07-11: qty 2

## 2021-07-11 MED ORDER — LACTATED RINGERS IV BOLUS (SEPSIS)
1000.0000 mL | Freq: Once | INTRAVENOUS | Status: AC
  Administered 2021-07-11: 1000 mL via INTRAVENOUS

## 2021-07-11 MED ORDER — ONDANSETRON HCL 4 MG/2ML IJ SOLN: 4.0000 mg | Freq: Four times a day (QID) | INTRAMUSCULAR | Status: DC | PRN

## 2021-07-11 MED ORDER — ACETAMINOPHEN 325 MG PO TABS
650.0000 mg | ORAL_TABLET | Freq: Four times a day (QID) | ORAL | Status: DC | PRN
  Administered 2021-07-11: 650 mg via ORAL
  Filled 2021-07-11: qty 2

## 2021-07-11 MED ORDER — ACETAMINOPHEN 500 MG PO TABS
1000.0000 mg | ORAL_TABLET | Freq: Once | ORAL | Status: AC
  Administered 2021-07-11: 1000 mg via ORAL
  Filled 2021-07-11: qty 2

## 2021-07-11 MED ORDER — VANCOMYCIN HCL 2000 MG/400ML IV SOLN
2000.0000 mg | Freq: Once | INTRAVENOUS | Status: AC
  Administered 2021-07-11: 2000 mg via INTRAVENOUS
  Filled 2021-07-11: qty 400

## 2021-07-11 MED ORDER — HEPARIN SODIUM (PORCINE) 5000 UNIT/ML IJ SOLN: 5000.0000 [IU] | Freq: Three times a day (TID) | INTRAMUSCULAR | Status: DC

## 2021-07-11 MED ORDER — KETOROLAC TROMETHAMINE 15 MG/ML IJ SOLN
15.0000 mg | Freq: Once | INTRAMUSCULAR | Status: AC
  Administered 2021-07-11: 15 mg via INTRAVENOUS
  Filled 2021-07-11: qty 1

## 2021-07-11 MED ORDER — PROCHLORPERAZINE EDISYLATE 10 MG/2ML IJ SOLN
10.0000 mg | Freq: Once | INTRAMUSCULAR | Status: AC
  Administered 2021-07-11: 10 mg via INTRAVENOUS
  Filled 2021-07-11: qty 2

## 2021-07-11 MED ORDER — SODIUM CHLORIDE 0.9 % IV BOLUS
1000.0000 mL | Freq: Once | INTRAVENOUS | Status: AC
  Administered 2021-07-11: 1000 mL via INTRAVENOUS

## 2021-07-11 MED ORDER — VANCOMYCIN HCL 1250 MG/250ML IV SOLN: 1250.0000 mg | INTRAVENOUS | Status: DC

## 2021-07-11 MED ORDER — PIPERACILLIN-TAZOBACTAM 3.375 G IVPB 30 MIN
3.3750 g | Freq: Once | INTRAVENOUS | Status: DC
  Filled 2021-07-11: qty 50

## 2021-07-11 NOTE — Progress Notes (Signed)
Pharmacy Antibiotic Note  Denise [name] is a 70 y.o. female admitted on 07/11/2021 with  febrile neutropenia .  Pharmacy has been consulted for Vanco/Cefepime dosing.  Plan: Vancomycin 2000 mg IV x 1 dose. Vancomycin 1250 mg IV every 24 hours. Cefepime 2000 mg IV every 8 hours. Monitor labs, c/s, and vanco level as indicated     Temp (24hrs), Avg:100.7 F (38.2 C), Min:98.6 F (37 C), Max:102.7 F (39.3 C)  Recent Labs  Lab 07/11/21 1453  WBC 0.3*  CREATININE 0.79    Estimated Creatinine Clearance: 67.5 mL/min (by C-G formula based on SCr of 0.79 mg/dL).    Allergies  Allergen Reactions   Exforge [Amlodipine Besylate-Valsartan] Nausea And Vomiting   Cheese    Levofloxacin In D5w Hives   Strawberry Extract Diarrhea    Seeds,nuts, lettuce, grapes   Tramadol Hcl Nausea And Vomiting   Yeast-Related Products Hives    bread    Antimicrobials this admission: Vanco 10/28 >> Cefepime 10/28 >>    Microbiology results: 10/28 BCx: pending 10/28 UCx: pending    Thank you for allowing pharmacy to be a part of this patient's care.  Ramond Craver 07/11/2021 5:17 PM

## 2021-07-11 NOTE — ED Triage Notes (Signed)
Cancer patient , unable to eat due to vomiting

## 2021-07-11 NOTE — ED Provider Notes (Signed)
  Face-to-face evaluation   History: She presents for evaluation of shortness of breath and cough which is nonproductive.  She also reports a fever, last night.  She states she has decreased appetite but is not vomiting.  She denies diarrhea, abdominal pain, back pain or dizziness.  She lives alone and was brought here today by family member.  Physical exam: Elderly, overweight female.  She is alert and cooperative.  She is a poor historian.  Lungs have good air movement without wheezing.  There is no increased work of breathing.  Heart regular rate and rhythm without murmur.  Abdomen is soft and nontender.  Medical screening examination/treatment/procedure(s) were conducted as a shared visit with non-physician practitioner(s) and myself.  I personally evaluated the patient during the encounter    Daleen Bo, MD 07/12/21 0025

## 2021-07-11 NOTE — ED Provider Notes (Signed)
[facility] EMERGENCY DEPARTMENT Provider Note   CSN: [id number] Arrival date & time: 07/11/21  1234     History Chief Complaint  Patient presents with   Emesis    Denise [name] is a 70 y.o. female with past medical history significant for breast cancer who just completed a chemotherapy treatment 4 days ago who presents with cough, shortness of breath, headache, nausea, vomiting, general malaise.  Patient reports that she has had a cough ongoing for about a month, however she has now been experiencing nausea, vomiting, fever, shortness of breath since her recent treatment on the fourth.  Patient reports that she has never felt so miserable, she cannot keep anything down, she was given some nausea medication, but hasn't been able to keep anything down for long enough to take it. Patient reports the headache feels like a migraine, however it is bilateral, not associated with photophobia, phonophobia. Patient denies any dysarthria, facial droop, confusion.   Emesis Associated symptoms: cough and fever   Associated symptoms: no abdominal pain       Past Medical History:  Diagnosis Date   Asthma    as child   Cancer ([place]) 12/2020   left breast IMC   Complication of anesthesia    pateitn states,' I coded when I had my D&Cmany years ago.   Family history of breast cancer    Hypertension    Hypothyroidism    PONV (postoperative nausea and vomiting)    Port-A-Cath in place 02/26/2021    Patient Active Problem List   Diagnosis Date Noted   Neutropenic fever ([place]) 07/11/2021   Drug-induced neutropenia ([place]) 04/10/2021   Pancytopenia, acquired ([place]) 04/02/2021   Livedo reticularis 04/02/2021   Port-A-Cath in place 02/26/2021   Genetic testing 02/24/2021   Family history of breast cancer    Invasive lobular carcinoma of left breast in female ([place]) 01/23/2021   Non-intractable vomiting with nausea 01/11/2018   Abdominal pain 01/11/2018   Rectal bleeding 01/11/2018    Past  Surgical History:  Procedure Laterality Date   ABDOMINAL HYSTERECTOMY     BIOPSY  01/17/2018   Procedure: BIOPSY;  Surgeon: Rogene Houston, MD;  [facility];  Service: Endoscopy;;  duodenum,gastric   CHOLECYSTECTOMY     COLONOSCOPY WITH PROPOFOL N/A 01/17/2018   Procedure: COLONOSCOPY WITH PROPOFOL;  Surgeon: Rogene Houston, MD;  Location: [facility];  Service: Endoscopy;  Laterality: N/A;  7:30   DILATION AND CURETTAGE OF UTERUS     ESOPHAGOGASTRODUODENOSCOPY (EGD) WITH PROPOFOL N/A 01/17/2018   Procedure: ESOPHAGOGASTRODUODENOSCOPY (EGD) WITH PROPOFOL;  Surgeon: Rogene Houston, MD;  Location[facility];  Service: Endoscopy;  Laterality: N/A;   POLYPECTOMY  01/17/2018   Procedure: POLYPECTOMY;  Surgeon: Rogene Houston, MD;  Location: [facility];  Service: Endoscopy;;  transverse colon, cecal   PORTACATH PLACEMENT Right 02/17/2021   Procedure: INSERTION PORT-A-CATH;  Surgeon: Coralie Keens, MD;  Location: [place];  Service: General;  Laterality: Right;     OB History   No obstetric history on file.     Family History  Problem Relation Age of Onset   Breast cancer Mother        dx in her 50s   Heart disease Mother    Thyroid disease Mother    Lung cancer Father        dx in his 68s   Thyroid disease Maternal Aunt    Thyroid nodules Maternal Grandmother 48  goiter   Thyroid disease Maternal Grandmother    Heart disease Maternal Grandfather    Heart disease Paternal Grandmother    Heart disease Paternal Grandfather    Thyroid disease Daughter    Thyroid disease Daughter    Cancer Maternal Uncle        NOS   Cancer Paternal Uncle        NOS   Breast cancer Cousin        pat first cousin died in her 33s;     Social History   Tobacco Use   Smoking status: Never   Smokeless tobacco: Never  Vaping Use   Vaping Use: Never used  Substance Use Topics   Alcohol use: Never   Drug use: Never    Home Medications Prior to  Admission medications   Medication Sig Start Date End Date Taking? Authorizing Provider  Acetaminophen (TYLENOL PO) Take 1 tablet by mouth as needed.    [provider]  albuterol (VENTOLIN HFA) 108 (90 Base) MCG/ACT inhaler Inhale into the lungs. 11/07/17   [provider]  azelastine (ASTELIN) 0.1 % nasal spray Place into both nostrils. 06/30/21   [provider]  benazepril (LOTENSIN) 20 MG tablet Take 20 mg by mouth daily. 03/10/21   [provider]  CARBOPLATIN IV Inject into the vein once a week. 02/27/21   [provider]  doxepin (SINEQUAN) 10 MG capsule Take 10 mg by mouth at bedtime. 03/11/21   [provider]  famotidine (PEPCID) 20 MG tablet Take 1 tablet (20 mg total) by mouth daily. 04/16/21   Derek Jack, MD  filgrastim (NEUPOGEN) 480 MCG/0.8ML SOSY injection Inject 0.8 mLs (480 mcg total) into the skin daily as needed for up to 8 doses (self-administer subcutaneously as directed by Dr. Delton Coombes as needed for neutropenia). 05/09/21   Derek Jack, MD  gabapentin (NEURONTIN) 300 MG capsule Take 1 capsule by mouth at bedtime. 11/05/20   [provider]  hydrochlorothiazide (MICROZIDE) 12.5 MG capsule Take 12.5 mg by mouth daily. 11/05/20   [provider]  HYDROcodone-acetaminophen (NORCO) 5-325 MG tablet Take 1 tablet by mouth every 12 (twelve) hours as needed for moderate pain. 05/15/21   Derek Jack, MD  levothyroxine (SYNTHROID) 75 MCG tablet Take 100 mcg by mouth daily before breakfast.    [provider]  lidocaine-prilocaine (EMLA) cream Apply a small amount to port a cath site and cover with plastic wrap 1 hour prior to infusion appointments 02/26/21   Derek Jack, MD  losartan (COZAAR) 50 MG tablet Take 50 mg by mouth daily. 06/30/21   [provider]  magnesium oxide (MAG-OX) 400 (240 Mg) MG tablet Take 1 tablet (400 mg total) by mouth daily. 05/29/21   Derek Jack, MD  Melatonin 5 MG TABS Take 5 mg by mouth. As needed for sleep    [provider]  metoprolol succinate (TOPROL-XL) 25 MG 24 hr tablet Take by mouth. 03/03/21   [provider]  Omega-3 Fatty Acids (FISH OIL PO) Take by mouth.    [provider]  PACLITAXEL IV Inject into the vein once a week. 02/27/21   [provider]  Pembrolizumab (KEYTRUDA IV) Inject into the vein every 21 ( twenty-one) days. 02/27/21   [provider]  prochlorperazine (COMPAZINE) 10 MG tablet Take 1 tablet (10 mg total) by mouth every 6 (six) hours as needed (Nausea or vomiting). 02/26/21   Derek Jack, MD  psyllium (METAMUCIL) 58.6 % packet Take by  mouth.    [provider]  rosuvastatin (CRESTOR) 5 MG tablet Take 5 mg by mouth daily.    [provider]  triamcinolone cream (KENALOG) 0.1 % SMARTSIG:1 Application Topical 2-3 Times Daily 03/10/21   [provider]    Allergies    Exforge [amlodipine besylate-valsartan], Cheese, Levofloxacin in d5w, Strawberry extract, Tramadol hcl, and Yeast-related products  Review of Systems   Review of Systems  Constitutional:  Positive for fever.  Respiratory:  Positive for cough and shortness of breath.   Gastrointestinal:  Positive for nausea and vomiting. Negative for abdominal pain.  All other systems reviewed and are negative.  Physical Exam Updated Vital Signs BP 128/65   Pulse 85   Temp (!) 102.7 F (39.3 C) (Oral) Comment: EDP made aware  Resp (!) 31   SpO2 96%   Physical Exam Vitals and nursing note reviewed.  Constitutional:      General: She is not in acute distress.    Appearance: Normal appearance. She is ill-appearing.     Comments: Ill-appearing patient sitting on bed in no acute distress.  Patient does look pale, has had some nausea and retching during exam  HENT:     Head: Normocephalic and atraumatic.  Eyes:     General:        Right eye: No discharge.         Left eye: No discharge.  Cardiovascular:     Rate and Rhythm: Normal rate and regular rhythm.     Heart sounds: No murmur heard.   No friction rub. No gallop.  Pulmonary:     Effort: Pulmonary effort is normal.     Breath sounds: Normal breath sounds.     Comments: Scattered rhonchi that do not clear with cough, no wheezing.  No respiratory distress, or accessory muscle use. Abdominal:     General: Bowel sounds are normal.     Palpations: Abdomen is soft.     Tenderness: There is no abdominal tenderness.  Skin:    General: Skin is warm and dry.     Capillary Refill: Capillary refill takes less than 2 seconds.     Coloration: Skin is pale.  Neurological:     Mental Status: She is alert and oriented to person, place, and time.  Psychiatric:        Mood and Affect: Mood normal.        Behavior: Behavior normal.  teno  ED Results / Procedures / Treatments   Labs (all labs ordered are listed, but only abnormal results are displayed) Labs Reviewed  CBC WITH DIFFERENTIAL/PLATELET - Abnormal; Notable for the following components:      Result Value   WBC 0.3 (*)    RBC 2.43 (*)    Hemoglobin 8.6 (*)    HCT 25.0 (*)    MCV 102.9 (*)    MCH 35.4 (*)    Platelets 109 (*)    Neutro Abs 0.0 (*)    Lymphs Abs 0.3 (*)    Monocytes Absolute 0.0 (*)    All other components within normal limits  COMPREHENSIVE METABOLIC PANEL - Abnormal; Notable for the following components:   Sodium 131 (*)    Glucose, Bld 111 (*)    Calcium 8.7 (*)    All other components within normal limits  RESP PANEL BY RT-PCR (FLU A&B, COVID) ARPGX2  CULTURE, BLOOD (SINGLE)  URINE CULTURE  PROTIME-INR  LACTIC ACID, PLASMA  APTT  URINALYSIS, ROUTINE W REFLEX MICROSCOPIC  BLOOD GAS, VENOUS  LACTIC ACID, PLASMA    EKG None  Radiology DG Chest Port 1 View  Result Date: 07/11/2021 CLINICAL DATA:  Shortness of breath. Additional history provided: Patient with a history of breast cancer, inability to eat  due to vomiting, shortness of breath. EXAM: PORTABLE CHEST 1 VIEW COMPARISON:  Prior chest radiographs 06/19/2021 and earlier. FINDINGS: Redemonstrated right chest infusion port catheter with tip projecting at the level of the superior cavoatrial junction. Mild cardiomegaly, unchanged. Aortic atherosclerosis. Redemonstrated small calcified granuloma within the left upper lobe. Grossly unchanged nodular opacity within the left mid-lung field, corresponding with a known left lower lobe pulmonary nodule. Otherwise, there is no appreciable airspace consolidation. No evidence of pleural effusion or pneumothorax. As before, there is elevation of the right hemidiaphragm. No displaced fracture. Degenerative changes of the spine. IMPRESSION: No evidence of acute cardiopulmonary abnormality. Grossly unchanged left lower lobe pulmonary nodule. Mild cardiomegaly, also unchanged. Aortic Atherosclerosis (ICD10-I70.0). Electronically Signed   By: Kellie Simmering D.O.   On: 07/11/2021 14:54    Procedures Procedures   Medications Ordered in ED Medications  lactated ringers bolus 1,000 mL (has no administration in time range)  lactated ringers infusion (has no administration in time range)  vancomycin (VANCOREADY) IVPB 2000 mg/400 mL (has no administration in time range)  ceFEPIme (MAXIPIME) 2 g in sodium chloride 0.9 % 100 mL IVPB (has no administration in time range)  lactated ringers infusion (has no administration in time range)  prochlorperazine (COMPAZINE) injection 10 mg (10 mg Intravenous Given 07/11/21 1449)  sodium chloride 0.9 % bolus 1,000 mL (0 mLs Intravenous Stopped 07/11/21 1535)  ketorolac (TORADOL) 15 MG/ML injection 15 mg (15 mg Intravenous Given 07/11/21 1449)  acetaminophen (TYLENOL) tablet 1,000 mg (1,000 mg Oral Given 07/11/21 1534)    ED Course  I have reviewed the triage vital signs and the nursing notes.  Pertinent labs & imaging results that were available during my care of the patient  were reviewed by me and considered in my medical decision making (see chart for details).    MDM Rules/Calculators/A&P                         I discussed this case with my attending physician who cosigned this note including patient's presenting symptoms, physical exam, and planned diagnostics and interventions. Attending physician stated agreement with plan or made changes to plan which were implemented.   Attending physician assessed patient at bedside.  Ongoing breast cancer patient who just received chemotherapy 4 days ago with carboplatin, who has a history of neutropenia and does receive chronic granulocyte stimulating factor who presents with worsening nausea, vomiting, shortness of breath, cough, and fever for the last few days since her last treatment.  Patient does report that she has had the chronic cough for around a month.  Patient is overall ill-appearing, and actively retching during my exam.  Patient also complains of a headache, no neurologic deficit, no facial droop, not sudden in onset, and normal neurologic exam.    We will begin with fluid bolus, Toradol, and Compazine for nausea and headache while we wait for lab work.  CMP is unremarkable other than a mild hyponatremia.  CBC shows acute neutropenia with absolute neutrophil count of 0.0.  Patient now presenting with a 102.7 degree fever after being present in the department for a few hours.  At this time patient meets criteria for febrile neutropenia, we will begin sepsis work-up as  chest x-ray did not show active pneumonia.  Spoke with Dr. Delton Coombes who would like to begin vanc and zosyn.   Spoke with hospitalist for admission at this time for neutropenic fever. UA, blood cultures are pending to determine source at this time. No evidence of mucositis, no evidence of infection at port-a-cath. Final Clinical Impression(s) / ED Diagnoses Final diagnoses:  SOB (shortness of breath)  Neutropenic fever [facility])    Rx / DC  Orders ED Discharge Orders     None        Dorien Chihuahua 07/11/21 1650    Daleen Bo, MD 09/24/21 1759

## 2021-07-11 NOTE — Telephone Encounter (Signed)
Patient called and stated that she has not felt well since treatment last Friday.  Started with nausea and diarrhea.  She now has a productive cough, fever and chills.  Advised to go to the Emergency Room, rather than Urgent care since she has advised long term symptoms that have progressed without intervention.  States that she can hardly walk and cannot take herself.  She will call daughter to see if she can take her, if not she will call 911.  Will follow up on her Monday.

## 2021-07-11 NOTE — ED Notes (Signed)
Dr Waldron Labs at bedside to speak with pt.

## 2021-07-11 NOTE — ED Notes (Signed)
Date and time results received: 07/11/21 3:53 PM   Test:WBC Critical Value: 0.3  Test: Absolute Neutrophil Critical Value: 0.0  Name of Provider Notified: Quentin Mulling PA  Orders Received? Or Actions Taken?: see orders

## 2021-07-11 NOTE — Discharge Summary (Signed)
  Patient left AGAINST MEDICAL ADVICE, I have spent with the patient for at least 20 minutes trying to convince her to stay, I did discuss with her daughter Almyra Free at the phone as well who tried to convince her, but patient is adamant about leaving AMA, I have informed her about her significantly immunosuppressed status, and high risk for infection, and need to stay in hospital stay with IV antibiotics, patient is adamant about leaving, and she left AMA. Phillips Climes MD

## 2021-07-11 NOTE — ED Notes (Signed)
EDP at Bedside 

## 2021-07-11 NOTE — H&P (Signed)
[facility] H&P   Patient Demographics:    Denise [name], is a 70 y.o. female  MRN: [id number]   DOB - 1951-02-19  Admit Date - 07/11/2021  Outpatient Primary MD for the patient is Neale Burly, MD  Referring MD/NP/PA: PA Prosperi  Outpatient Specialists: Oncology Dr. Delton Coombes  Patient coming from: home  Chief Complaint  Patient presents with   Emesis      HPI:    Denise [name]  is a 70 y.o. female, patient with significant past medical history of breast cancer, who is currently receiving chemotherapy, she presents to ED secondary to complaints of nausea, vomiting, generalized weakness, fatigue,  and fatigue, as well she endorses fever 3 days, patient reports she got her most recent chemotherapy 10/21, she received her CSF injection Fulphila on 10/24, reports significant nausea and vomiting, feeling miserable, she cannot keep anything down, she denies any diarrhea. - in ED significant for low sodium at 131, pancytopenia with white blood cell of 0.3, hemoglobin of 8.6, and platelet of 109, chest x-ray with no acute findings, urine still pending, blood cultures were sent, she was started on broad-spectrum antibiotic, she was noted to have fever 102.7, [facility] hospitalist consulted to admit    Review of systems:    In addition to the HPI above Reports fever and chills for 3 days, reports weakness and poor appetite No Headache, No changes with Vision or hearing, No problems swallowing food or Liquids, Ports cough, chest pain related to cough, no dyspnea No Abdominal pain, No Nausea or Vommitting, Bowel movements are regular, No Blood in stool or Urine, No dysuria, No new skin rashes or bruises, For generalized body ache No new weakness, tingling, numbness in any extremity, No recent weight gain or loss, No polyuria, polydypsia or polyphagia, No significant Mental  Stressors.  A full 10 point Review of Systems was done, except as stated above, all other Review of Systems were negative.   With Past History of the following :    Past Medical History:  Diagnosis Date   Asthma    as child   Cancer ([place]) 12/2020   left breast IMC   Complication of anesthesia    pateitn states,' I coded when I had my D&Cmany years ago.   Family history of breast cancer    Hypertension    Hypothyroidism    PONV (postoperative nausea and vomiting)    Port-A-Cath in place 02/26/2021      Past Surgical History:  Procedure Laterality Date   ABDOMINAL HYSTERECTOMY     BIOPSY  01/17/2018   Procedure: BIOPSY;  Surgeon: Rogene Houston, MD;  [facility];  Service: Endoscopy;;  duodenum,gastric   CHOLECYSTECTOMY     COLONOSCOPY WITH PROPOFOL N/A 01/17/2018   Procedure: COLONOSCOPY WITH PROPOFOL;  Surgeon: Rogene Houston, MD;  [facility];  Service: Endoscopy;  Laterality: N/A;  7:30   DILATION AND CURETTAGE OF UTERUS     ESOPHAGOGASTRODUODENOSCOPY (EGD) WITH PROPOFOL N/A 01/17/2018   Procedure: ESOPHAGOGASTRODUODENOSCOPY (EGD) WITH PROPOFOL;  Surgeon: Rogene Houston, MD;  [facility];  Service: Endoscopy;  Laterality: N/A;   POLYPECTOMY  01/17/2018   Procedure: POLYPECTOMY;  Surgeon: Rogene Houston, MD;  [facility];  Service: Endoscopy;;  transverse colon, cecal   PORTACATH PLACEMENT Right 02/17/2021   Procedure: INSERTION PORT-A-CATH;  Surgeon: Coralie Keens, MD;  Location: [place];  Service: General;  Laterality: Right;      Social History:     Social History   Tobacco Use   Smoking status: Never   Smokeless tobacco: Never  Substance Use Topics   Alcohol use: Never        Family History :     Family History  Problem Relation Age of Onset   Breast cancer Mother        dx in her 69s   Heart disease Mother    Thyroid disease Mother    Lung cancer Father        dx in his 78s    Thyroid disease Maternal Aunt    Thyroid nodules Maternal Grandmother 35       goiter   Thyroid disease Maternal Grandmother    Heart disease Maternal Grandfather    Heart disease Paternal Grandmother    Heart disease Paternal Grandfather    Thyroid disease Daughter    Thyroid disease Daughter    Cancer Maternal Uncle        NOS   Cancer Paternal Uncle        NOS   Breast cancer Cousin        pat first cousin died in her 19s;       Home Medications:   Prior to Admission medications   Medication Sig Start Date End Date Taking? Authorizing Provider  Acetaminophen (TYLENOL PO) Take 1 tablet by mouth as needed.    [provider]  albuterol (VENTOLIN HFA) 108 (90 Base) MCG/ACT inhaler Inhale into the lungs. 11/07/17   [provider]  azelastine (ASTELIN) 0.1 % nasal spray Place into both nostrils. 06/30/21   [provider]  benazepril (LOTENSIN) 20 MG tablet Take 20 mg by mouth daily. 03/10/21   [provider]  CARBOPLATIN IV Inject into the vein once a week. 02/27/21   [provider]  doxepin (SINEQUAN) 10 MG capsule Take 10 mg by mouth at bedtime. 03/11/21   [provider]  famotidine (PEPCID) 20 MG tablet Take 1 tablet (20 mg total) by mouth daily. 04/16/21   Derek Jack, MD  filgrastim (NEUPOGEN) 480 MCG/0.8ML SOSY injection Inject 0.8 mLs (480 mcg total) into the skin daily as needed for up to 8 doses (self-administer subcutaneously as directed by Dr. Delton Coombes as needed for neutropenia). 05/09/21   Derek Jack, MD  gabapentin (NEURONTIN) 300 MG capsule Take 1 capsule by mouth at bedtime. 11/05/20   [provider]  hydrochlorothiazide (MICROZIDE) 12.5 MG capsule Take 12.5 mg by mouth daily. 11/05/20   [provider]  HYDROcodone-acetaminophen (NORCO) 5-325 MG tablet Take 1 tablet by mouth every 12 (twelve) hours as needed for moderate pain. 05/15/21   Derek Jack, MD  levothyroxine  (SYNTHROID) 75 MCG tablet Take 100 mcg by mouth daily before breakfast.    [provider]  lidocaine-prilocaine (EMLA) cream Apply a small amount to port a cath site and cover with plastic  wrap 1 hour prior to infusion appointments 02/26/21   Derek Jack, MD  losartan (COZAAR) 50 MG tablet Take 50 mg by mouth daily. 06/30/21   [provider]  magnesium oxide (MAG-OX) 400 (240 Mg) MG tablet Take 1 tablet (400 mg total) by mouth daily. 05/29/21   Derek Jack, MD  Melatonin 5 MG TABS Take 5 mg by mouth. As needed for sleep    [provider]  metoprolol succinate (TOPROL-XL) 25 MG 24 hr tablet Take by mouth. 03/03/21   [provider]  Omega-3 Fatty Acids (FISH OIL PO) Take by mouth.    [provider]  PACLITAXEL IV Inject into the vein once a week. 02/27/21   [provider]  Pembrolizumab (KEYTRUDA IV) Inject into the vein every 21 ( twenty-one) days. 02/27/21   [provider]  prochlorperazine (COMPAZINE) 10 MG tablet Take 1 tablet (10 mg total) by mouth every 6 (six) hours as needed (Nausea or vomiting). 02/26/21   Derek Jack, MD  psyllium (METAMUCIL) 58.6 % packet Take by mouth.    [provider]  rosuvastatin (CRESTOR) 5 MG tablet Take 5 mg by mouth daily.    [provider]  triamcinolone cream (KENALOG) 0.1 % SMARTSIG:1 Application Topical 2-3 Times Daily 03/10/21   [provider]     Allergies:     Allergies  Allergen Reactions   Exforge [Amlodipine Besylate-Valsartan] Nausea And Vomiting   Cheese    Levofloxacin In D5w Hives   Strawberry Extract Diarrhea    Seeds,nuts, lettuce, grapes   Tramadol Hcl Nausea And Vomiting   Yeast-Related Products Hives    bread     Physical Exam:   Vitals  Blood pressure 128/65, pulse 85, temperature (!) 102.7 F (39.3 C), temperature source Oral, resp. rate (!) 31, SpO2 96 %.   1. General frail, ill-appearing female, laying  in bed in mild discomfort  2. Normal affect and insight, Not Suicidal or Homicidal, Awake Alert, Oriented X 3.  3. No F.N deficits, ALL C.Nerves Intact, Strength 5/5 all 4 extremities, Sensation intact all 4 extremities, Plantars down going.has alopecia due to chemo, no oral thrush, ulcers mucositis, she has some mild erythema in the back of the throat.  4. Ears and Eyes appear Normal, Conjunctivae clear, PERRLA. Moist Oral Mucosa.  5. Supple Neck, No JVD, No cervical lymphadenopathy appriciated, No Carotid Bruits.  6. Symmetrical Chest wall movement, Good air movement bilaterally, CTAB.  7. RRR, No Gallops, Rubs or Murmurs, No Parasternal Heave.  8. Positive Bowel Sounds, Abdomen Soft, No tenderness, No organomegaly appriciated,No rebound -guarding or rigidity.  9.  No Cyanosis, Normal Skin Turgor, No Skin Rash or Bruise.  Port-A-Cath at right chest looks clean  10. Good muscle tone,  joints appear normal , no effusions, Normal ROM.    Data Review:    CBC Recent Labs  Lab 07/11/21 1453  WBC 0.3*  HGB 8.6*  HCT 25.0*  PLT 109*  MCV 102.9*  MCH 35.4*  MCHC 34.4  RDW 13.0  LYMPHSABS 0.3*  MONOABS 0.0*  EOSABS 0.0  BASOSABS 0.0   ------------------------------------------------------------------------------------------------------------------  Chemistries  Recent Labs  Lab 07/11/21 1453  NA 131*  K 3.7  CL 99  CO2 23  GLUCOSE 111*  BUN 17  CREATININE 0.79  CALCIUM 8.7*  AST 17  ALT 19  ALKPHOS 73  BILITOT 0.6   ------------------------------------------------------------------------------------------------------------------ estimated creatinine clearance is 67.5 mL/min (by C-G formula based on SCr of 0.79 mg/dL). ------------------------------------------------------------------------------------------------------------------ No  results for input(s): TSH, T4TOTAL, T3FREE, THYROIDAB in the last 72 hours.  Invalid input(s): FREET3  Coagulation profile No  results for input(s): INR, PROTIME in the last 168 hours. ------------------------------------------------------------------------------------------------------------------- No results for input(s): DDIMER in the last 72 hours. -------------------------------------------------------------------------------------------------------------------  Cardiac Enzymes No results for input(s): CKMB, TROPONINI, MYOGLOBIN in the last 168 hours.  Invalid input(s): CK ------------------------------------------------------------------------------------------------------------------ No results found for: BNP   ---------------------------------------------------------------------------------------------------------------  Urinalysis No results found for: COLORURINE, APPEARANCEUR, LABSPEC, PHURINE, GLUCOSEU, HGBUR, BILIRUBINUR, KETONESUR, PROTEINUR, UROBILINOGEN, NITRITE, LEUKOCYTESUR  ----------------------------------------------------------------------------------------------------------------   Imaging Results:    DG Chest Port 1 View  Result Date: 07/11/2021 CLINICAL DATA:  Shortness of breath. Additional history provided: Patient with a history of breast cancer, inability to eat due to vomiting, shortness of breath. EXAM: PORTABLE CHEST 1 VIEW COMPARISON:  Prior chest radiographs 06/19/2021 and earlier. FINDINGS: Redemonstrated right chest infusion port catheter with tip projecting at the level of the superior cavoatrial junction. Mild cardiomegaly, unchanged. Aortic atherosclerosis. Redemonstrated small calcified granuloma within the left upper lobe. Grossly unchanged nodular opacity within the left mid-lung field, corresponding with a known left lower lobe pulmonary nodule. Otherwise, there is no appreciable airspace consolidation. No evidence of pleural effusion or pneumothorax. As before, there is elevation of the right hemidiaphragm. No displaced fracture. Degenerative changes of the spine.  IMPRESSION: No evidence of acute cardiopulmonary abnormality. Grossly unchanged left lower lobe pulmonary nodule. Mild cardiomegaly, also unchanged. Aortic Atherosclerosis (ICD10-I70.0). Electronically Signed   By: Kellie Simmering D.O.   On: 07/11/2021 14:54    My personal review of EKG: Rhythm NSR,  Vent. rate 99 BPM PR interval 125 ms QRS duration 87 ms QT/QTcB 315/405 ms P-R-T axes 1 9 62 Sinus tachycardia Multiple premature complexes, vent & supraven  Assessment & Plan:    Active Problems:   Invasive lobular carcinoma of left breast in female [facility])   Pancytopenia, acquired ([place])   Drug-induced neutropenia ([facility])   Neutropenic fever ([facility])   Neutropenic fever with pancytopenia -Due to chemotherapy, she received GSF on 10/24, so hopefully white blood cell count will be improving. -She is febrile, neutropenic, tach work-up was sent, meanwhile continue to cover with vancomycin and cefepime. -Discontinue vancomycin in 24 hours given portal site looks clean, she has no oral mucositis and her septic work-up remains negative. -Continue with IV fluids. -SCD for DVT prophylaxis. -Macrocytic anemia in the setting of chemotherapy, but will check W29 and folic acid.  Breast cancer -followed  by Dr. Delton Coombes, she is on chemotherapy with pembrolizumab. -Was recent chemo 10/21, received Fulaphila on 10/24   Hypothyroidism: -Continue Synthroid - Continue Synthroid 75 mcg daily.  Last TSH was 2.5.  Chest pain: - By reviewing her records it does appear she develops chest pains if she receives more than 2 G-CSF injections per week. -She does endorse coughing and chest pain related to that as well.Marland Kitchen  Hyponatremia - Due to volume depletion from nausea and vomiting, will start on LR.    DVT Prophylaxis  SCDs   AM Labs Ordered, also please review Full Orders  Family Communication: Admission, patients condition and plan of care including tests being ordered have been discussed with the  patient who indicate understanding and agree with the plan and Code Status.  Code Status Full  Likely DC to  home  Condition GUARDED    Consults called: None    Admission status: inpatient    Time spent in minutes : 65 minutes   Phillips Climes M.D on 07/11/2021 at  4:28 PM   [facility] - Office  [phone]

## 2021-07-11 NOTE — ED Notes (Signed)
Pt states she wants to go home. Dr. Waldron Labs paged to come speak with pt.

## 2021-07-13 DIAGNOSIS — B348 Other viral infections of unspecified site: Secondary | ICD-10-CM | POA: Diagnosis present

## 2021-07-13 DIAGNOSIS — R059 Cough, unspecified: Secondary | ICD-10-CM | POA: Diagnosis not present

## 2021-07-13 DIAGNOSIS — E079 Disorder of thyroid, unspecified: Secondary | ICD-10-CM | POA: Diagnosis present

## 2021-07-13 DIAGNOSIS — I10 Essential (primary) hypertension: Secondary | ICD-10-CM | POA: Diagnosis present

## 2021-07-13 DIAGNOSIS — R5081 Fever presenting with conditions classified elsewhere: Secondary | ICD-10-CM | POA: Diagnosis not present

## 2021-07-13 DIAGNOSIS — Z602 Problems related to living alone: Secondary | ICD-10-CM | POA: Diagnosis present

## 2021-07-13 DIAGNOSIS — Z20822 Contact with and (suspected) exposure to covid-19: Secondary | ICD-10-CM | POA: Diagnosis present

## 2021-07-13 DIAGNOSIS — Z79890 Hormone replacement therapy (postmenopausal): Secondary | ICD-10-CM | POA: Diagnosis not present

## 2021-07-13 DIAGNOSIS — T451X5A Adverse effect of antineoplastic and immunosuppressive drugs, initial encounter: Secondary | ICD-10-CM | POA: Diagnosis present

## 2021-07-13 DIAGNOSIS — D701 Agranulocytosis secondary to cancer chemotherapy: Secondary | ICD-10-CM | POA: Diagnosis present

## 2021-07-13 DIAGNOSIS — Z888 Allergy status to other drugs, medicaments and biological substances status: Secondary | ICD-10-CM | POA: Diagnosis not present

## 2021-07-13 DIAGNOSIS — D61818 Other pancytopenia: Secondary | ICD-10-CM | POA: Diagnosis present

## 2021-07-13 DIAGNOSIS — D61810 Antineoplastic chemotherapy induced pancytopenia: Secondary | ICD-10-CM | POA: Diagnosis not present

## 2021-07-13 DIAGNOSIS — D709 Neutropenia, unspecified: Secondary | ICD-10-CM | POA: Diagnosis not present

## 2021-07-13 DIAGNOSIS — C50919 Malignant neoplasm of unspecified site of unspecified female breast: Secondary | ICD-10-CM | POA: Diagnosis present

## 2021-07-13 DIAGNOSIS — Z9049 Acquired absence of other specified parts of digestive tract: Secondary | ICD-10-CM | POA: Diagnosis not present

## 2021-07-13 DIAGNOSIS — B9789 Other viral agents as the cause of diseases classified elsewhere: Secondary | ICD-10-CM | POA: Diagnosis not present

## 2021-07-13 DIAGNOSIS — R509 Fever, unspecified: Secondary | ICD-10-CM | POA: Diagnosis not present

## 2021-07-13 DIAGNOSIS — Z90710 Acquired absence of both cervix and uterus: Secondary | ICD-10-CM | POA: Diagnosis not present

## 2021-07-14 ENCOUNTER — Telehealth (HOSPITAL_COMMUNITY): Payer: Self-pay | Admitting: *Deleted

## 2021-07-14 NOTE — Telephone Encounter (Signed)
After reviewing chart, Denise [name] did go to the emergency room with neutropenic fever.  Left AMA.  Attempted to call and follow up with her, however no answer on her number or her daughter's number.  Will continue to follow up.

## 2021-07-14 NOTE — Telephone Encounter (Signed)
Followed up with patient, who is an IP at [facility] with neutropenia with fevers.  Will continue to follow and arrange for follow up once discharged.

## 2021-07-16 LAB — CULTURE, BLOOD (SINGLE): Culture: NO GROWTH

## 2021-07-17 ENCOUNTER — Other Ambulatory Visit (HOSPITAL_COMMUNITY): Payer: Self-pay | Admitting: *Deleted

## 2021-07-17 ENCOUNTER — Inpatient Hospital Stay (HOSPITAL_COMMUNITY): Payer: Medicare Other | Attending: Hematology

## 2021-07-17 ENCOUNTER — Telehealth (HOSPITAL_COMMUNITY): Payer: Self-pay | Admitting: *Deleted

## 2021-07-17 ENCOUNTER — Inpatient Hospital Stay (HOSPITAL_BASED_OUTPATIENT_CLINIC_OR_DEPARTMENT_OTHER): Payer: Medicare Other | Admitting: Hematology

## 2021-07-17 ENCOUNTER — Other Ambulatory Visit: Payer: Self-pay

## 2021-07-17 DIAGNOSIS — R5383 Other fatigue: Secondary | ICD-10-CM | POA: Diagnosis not present

## 2021-07-17 DIAGNOSIS — R0601 Orthopnea: Secondary | ICD-10-CM | POA: Insufficient documentation

## 2021-07-17 DIAGNOSIS — C50912 Malignant neoplasm of unspecified site of left female breast: Secondary | ICD-10-CM

## 2021-07-17 DIAGNOSIS — R911 Solitary pulmonary nodule: Secondary | ICD-10-CM | POA: Diagnosis not present

## 2021-07-17 DIAGNOSIS — Z79899 Other long term (current) drug therapy: Secondary | ICD-10-CM | POA: Diagnosis not present

## 2021-07-17 DIAGNOSIS — Z801 Family history of malignant neoplasm of trachea, bronchus and lung: Secondary | ICD-10-CM | POA: Insufficient documentation

## 2021-07-17 DIAGNOSIS — E039 Hypothyroidism, unspecified: Secondary | ICD-10-CM | POA: Insufficient documentation

## 2021-07-17 DIAGNOSIS — D61818 Other pancytopenia: Secondary | ICD-10-CM

## 2021-07-17 DIAGNOSIS — Z803 Family history of malignant neoplasm of breast: Secondary | ICD-10-CM | POA: Diagnosis not present

## 2021-07-17 DIAGNOSIS — C50812 Malignant neoplasm of overlapping sites of left female breast: Secondary | ICD-10-CM | POA: Insufficient documentation

## 2021-07-17 DIAGNOSIS — E876 Hypokalemia: Secondary | ICD-10-CM | POA: Insufficient documentation

## 2021-07-17 DIAGNOSIS — Z8781 Personal history of (healed) traumatic fracture: Secondary | ICD-10-CM | POA: Diagnosis not present

## 2021-07-17 DIAGNOSIS — R059 Cough, unspecified: Secondary | ICD-10-CM | POA: Diagnosis not present

## 2021-07-17 DIAGNOSIS — Z79890 Hormone replacement therapy (postmenopausal): Secondary | ICD-10-CM | POA: Insufficient documentation

## 2021-07-17 DIAGNOSIS — G629 Polyneuropathy, unspecified: Secondary | ICD-10-CM | POA: Insufficient documentation

## 2021-07-17 DIAGNOSIS — Z171 Estrogen receptor negative status [ER-]: Secondary | ICD-10-CM | POA: Diagnosis not present

## 2021-07-17 DIAGNOSIS — R531 Weakness: Secondary | ICD-10-CM | POA: Insufficient documentation

## 2021-07-17 DIAGNOSIS — C50811 Malignant neoplasm of overlapping sites of right female breast: Secondary | ICD-10-CM | POA: Diagnosis present

## 2021-07-17 DIAGNOSIS — D702 Other drug-induced agranulocytosis: Secondary | ICD-10-CM

## 2021-07-17 LAB — COMPREHENSIVE METABOLIC PANEL
ALT: 15 U/L (ref 0–44)
AST: 18 U/L (ref 15–41)
Albumin: 2.9 g/dL — ABNORMAL LOW (ref 3.5–5.0)
Alkaline Phosphatase: 72 U/L (ref 38–126)
Anion gap: 8 (ref 5–15)
BUN: 8 mg/dL (ref 8–23)
CO2: 26 mmol/L (ref 22–32)
Calcium: 8.4 mg/dL — ABNORMAL LOW (ref 8.9–10.3)
Chloride: 105 mmol/L (ref 98–111)
Creatinine, Ser: 0.59 mg/dL (ref 0.44–1.00)
GFR, Estimated: >60 mL/min (ref >60)
Glucose, Bld: 130 mg/dL — ABNORMAL HIGH (ref 70–99)
Potassium: 2.9 mmol/L — ABNORMAL LOW (ref 3.5–5.1)
Sodium: 139 mmol/L (ref 135–145)
Total Bilirubin: 0.4 mg/dL (ref 0.3–1.2)
Total Protein: 6.4 g/dL — ABNORMAL LOW (ref 6.5–8.1)

## 2021-07-17 LAB — CBC WITH DIFFERENTIAL/PLATELET
Band Neutrophils: 2 %
Basophils Absolute: 0 10*3/uL (ref 0.0–0.1)
Basophils Relative: 0 %
Eosinophils Absolute: 0 10*3/uL (ref 0.0–0.5)
Eosinophils Relative: 0 %
HCT: 24.1 % — ABNORMAL LOW (ref 36.0–46.0)
Hemoglobin: 8.2 g/dL — ABNORMAL LOW (ref 12.0–15.0)
Lymphocytes Relative: 25 %
Lymphs Abs: 1.4 10*3/uL (ref 0.7–4.0)
MCH: 34.3 pg — ABNORMAL HIGH (ref 26.0–34.0)
MCHC: 34 g/dL (ref 30.0–36.0)
MCV: 100.8 fL — ABNORMAL HIGH (ref 80.0–100.0)
Metamyelocytes Relative: 2 %
Monocytes Absolute: 0.6 10*3/uL (ref 0.1–1.0)
Monocytes Relative: 11 %
Neutro Abs: 3.3 10*3/uL (ref 1.7–7.7)
Neutrophils Relative %: 60 %
Platelets: 89 10*3/uL — ABNORMAL LOW (ref 150–400)
RBC: 2.39 MIL/uL — ABNORMAL LOW (ref 3.87–5.11)
RDW: 14.3 % (ref 11.5–15.5)
WBC: 5.4 10*3/uL (ref 4.0–10.5)
nRBC: 0 % (ref 0.0–0.2)
nRBC: 1 /100 WBC — ABNORMAL HIGH

## 2021-07-17 LAB — SAMPLE TO BLOOD BANK

## 2021-07-17 LAB — MAGNESIUM: Magnesium: 1.4 mg/dL — ABNORMAL LOW (ref 1.7–2.4)

## 2021-07-17 MED ORDER — MAGNESIUM SULFATE 2 GM/50ML IV SOLN
2.0000 g | INTRAVENOUS | Status: AC
  Administered 2021-07-17 (×2): 2 g via INTRAVENOUS
  Filled 2021-07-17 (×2): qty 50

## 2021-07-17 MED ORDER — HEPARIN SOD (PORK) LOCK FLUSH 100 UNIT/ML IV SOLN
500.0000 [IU] | Freq: Once | INTRAVENOUS | Status: AC
  Administered 2021-07-17: 500 [IU] via INTRAVENOUS

## 2021-07-17 MED ORDER — MAGNESIUM SULFATE 2 GM/50ML IV SOLN: 2.0000 g | Freq: Once | INTRAVENOUS | Status: DC

## 2021-07-17 MED ORDER — SODIUM CHLORIDE 0.9 % IV SOLN: INTRAVENOUS | Status: DC

## 2021-07-17 MED ORDER — SODIUM CHLORIDE 0.9% FLUSH
10.0000 mL | INTRAVENOUS | Status: DC | PRN
  Administered 2021-07-17: 10 mL via INTRAVENOUS

## 2021-07-17 MED ORDER — POTASSIUM CHLORIDE CRYS ER 20 MEQ PO TBCR
40.0000 meq | EXTENDED_RELEASE_TABLET | Freq: Once | ORAL | Status: AC
  Administered 2021-07-17: 40 meq via ORAL
  Filled 2021-07-17: qty 2

## 2021-07-17 NOTE — Progress Notes (Signed)
[place], [place] [address]   CLINIC:  Medical Oncology/Hematology  PCP:  Denise Burly, MD [place] / [place] [address] [phone]   REASON FOR VISIT:  Follow-up for left breast cancer  PRIOR THERAPY: none  NGS Results: not done  CURRENT THERAPY: Weekly carboplatin and paclitaxel with every 3 weeks pembrolizumab (keynote-522) started on 02/27/2021  BRIEF ONCOLOGIC HISTORY:  Oncology History  Invasive lobular carcinoma of left breast in female [facility])  01/23/2021 Initial Diagnosis   Invasive lobular carcinoma of left breast in female [facility])   02/19/2021 Genetic Testing   Negative genetic testing on the CancerNext-Expanded+RNAinsight panel.  SMARCB1 VUS identified.  The CancerNext-Expanded gene panel offered by [facility] and includes sequencing and rearrangement analysis for the following 77 genes: AIP, ALK, APC*, ATM*, AXIN2, BAP1, BARD1, BLM, BMPR1A, BRCA1*, BRCA2*, BRIP1*, CDC73, CDH1*, CDK4, CDKN1B, CDKN2A, CHEK2*, CTNNA1, DICER1, FANCC, FH, FLCN, GALNT12, KIF1B, LZTR1, MAX, MEN1, MET, MLH1*, MSH2*, MSH3, MSH6*, MUTYH*, NBN, NF1*, NF2, NTHL1, PALB2*, PHOX2B, PMS2*, POT1, PRKAR1A, PTCH1, PTEN*, RAD51C*, RAD51D*, RB1, RECQL, RET, SDHA, SDHAF2, SDHB, SDHC, SDHD, SMAD4, SMARCA4, SMARCB1, SMARCE1, STK11, SUFU, TMEM127, TP53*, TSC1, TSC2, VHL and XRCC2 (sequencing and deletion/duplication); EGFR, EGLN1, HOXB13, KIT, MITF, PDGFRA, POLD1, and POLE (sequencing only); EPCAM and GREM1 (deletion/duplication only). DNA and RNA analyses performed for * genes. The report date is February 19, 2021.   02/27/2021 -  Chemotherapy   Patient is on Treatment Plan : BREAST Pembrolizumab + Carboplatin D1,8,15+ Paclitaxel D1,8,15 q21d X 4 cycles / Pembrolizumab + AC q21d x 4 cycles       CANCER STAGING: Cancer Staging Invasive lobular carcinoma of left breast in female [facility]) Staging form: Breast, AJCC 8th Edition - Clinical stage from 01/23/2021: cT3,  cN3c, G2, ER-, PR-, HER2- - Unsigned   INTERVAL HISTORY:  Denise [name], a 70 y.o. female, returns for routine follow-up of her left breast cancer. Denise [name] was last seen on 06/26/2021.   Today she reports feeling poorly. She presented to the ED on 10/28 for neutropenic fever, chills, and a cough that started on 10/25. She started vomiting 10/30, and she reports the vomit was yellow. She was discharged from the hospital the night of 11/01 at which time she was started on Augmentin. The day after her latest treatment she had green and watery diarrhea lasting 2-3 hours; this has now resolved, but she reports constipation. She reports fatigue and overall weakness that is continually worsening, and she has no appetite. She has trouble swallowing, and she reports nausea for which she is taking Zofran, dry mouth, thickened saliva, and dry nose. She reports difficulty urinating due to small amount of urine, but she denies dysuria. She has a cough productive of a lot of yellow thick sputum and SOB. She also reports one mouth sore since her previous treatment which has now resolved.   REVIEW OF SYSTEMS:  Review of Systems  Constitutional:  Positive for appetite change (no appetite), chills, fatigue (depleted) and fever.  HENT:   Positive for mouth sores and trouble swallowing.        Dry mouth and nose  Respiratory:  Positive for cough and shortness of breath.   Cardiovascular:  Positive for chest pain.  Gastrointestinal:  Positive for constipation, diarrhea, nausea and vomiting.  Genitourinary:  Positive for bladder incontinence (coughing causes urination). Negative for dysuria.   Musculoskeletal:  Positive for myalgias (5/10 all over).  Neurological:  Positive for dizziness, extremity  weakness, headaches and numbness.  All other systems reviewed and are negative.  PAST MEDICAL/SURGICAL HISTORY:  Past Medical History:  Diagnosis Date   Asthma    as child   Cancer ([place]) 12/2020   left breast  [facility]   Complication of anesthesia    pateitn states,' I coded when I had my D&Cmany years ago.   Family history of breast cancer    Hypertension    Hypothyroidism    PONV (postoperative nausea and vomiting)    Port-A-Cath in place 02/26/2021   Past Surgical History:  Procedure Laterality Date   ABDOMINAL HYSTERECTOMY     BIOPSY  01/17/2018   Procedure: BIOPSY;  Surgeon: Denise Houston, MD;  [facility];  Service: Endoscopy;;  duodenum,gastric   CHOLECYSTECTOMY     COLONOSCOPY WITH PROPOFOL N/A 01/17/2018   Procedure: COLONOSCOPY WITH PROPOFOL;  Surgeon: Denise Houston, MD;  [facility];  Service: Endoscopy;  Laterality: N/A;  7:30   DILATION AND CURETTAGE OF UTERUS     ESOPHAGOGASTRODUODENOSCOPY (EGD) WITH PROPOFOL N/A 01/17/2018   Procedure: ESOPHAGOGASTRODUODENOSCOPY (EGD) WITH PROPOFOL;  Surgeon: Denise Houston, MD;  [facility];  Service: Endoscopy;  Laterality: N/A;   POLYPECTOMY  01/17/2018   Procedure: POLYPECTOMY;  Surgeon: Denise Houston, MD;  [facility];  Service: Endoscopy;;  transverse colon, cecal   PORTACATH PLACEMENT Right 02/17/2021   Procedure: INSERTION PORT-A-CATH;  Surgeon: Denise Keens, MD;  Location: [facility];  Service: General;  Laterality: Right;    SOCIAL HISTORY:  Social History   Socioeconomic History   Marital status: Widowed    Spouse name: Not on file   Number of children: 3   Years of education: Not on file   Highest education level: Not on file  Occupational History   Occupation: [employer]   Occupation: retired  Tobacco Use   Smoking status: Never   Smokeless tobacco: Never  Scientific laboratory technician Use: Never used  Substance and Sexual Activity   Alcohol use: Never   Drug use: Never   Sexual activity: Not Currently    Birth control/protection: Surgical  Other Topics Concern   Not on file  Social History Narrative   Not on file   Social Determinants of  Health   Financial Resource Strain: Low Risk    Difficulty of Paying Living Expenses: Not hard at all  Food Insecurity: No Food Insecurity   Worried About Charity fundraiser in the Last Year: Never true   [place] in the Last Year: Never true  Transportation Needs: No Transportation Needs   Lack of Transportation (Medical): No   Lack of Transportation (Non-Medical): No  Physical Activity: Insufficiently Active   Days of Exercise per Week: 3 days   Minutes of Exercise per Session: 30 min  Stress: No Stress Concern Present   Feeling of Stress : Not at all  Social Connections: Moderately Integrated   Frequency of Communication with Friends and Family: More than three times a week   Frequency of Social Gatherings with Friends and Family: More than three times a week   Attends Religious Services: More than 4 times per year   Active Member of Clubs or Organizations: No   Attends Music therapist: More than 4 times per year   Marital Status: Widowed  Intimate Partner Violence: Not At Risk   Fear of Current or Ex-Partner: No   Emotionally  Abused: No   Physically Abused: No   Sexually Abused: No    FAMILY HISTORY:  Family History  Problem Relation Age of Onset   Breast cancer Mother        dx in her 77s   Heart disease Mother    Thyroid disease Mother    Lung cancer Father        dx in his 83s   Thyroid disease Maternal Aunt    Thyroid nodules Maternal Grandmother 61       goiter   Thyroid disease Maternal Grandmother    Heart disease Maternal Grandfather    Heart disease Paternal Grandmother    Heart disease Paternal Grandfather    Thyroid disease Daughter    Thyroid disease Daughter    Cancer Maternal Uncle        NOS   Cancer Paternal Uncle        NOS   Breast cancer Cousin        pat first cousin died in her 99s;     CURRENT MEDICATIONS:  Current Outpatient Medications  Medication Sig Dispense Refill   Acetaminophen (TYLENOL PO) Take 1 tablet  by mouth as needed.     albuterol (VENTOLIN HFA) 108 (90 Base) MCG/ACT inhaler Inhale into the lungs.     amoxicillin (AMOXIL) 875 MG tablet Take 875 mg by mouth 2 (two) times daily.     amoxicillin-clavulanate (AUGMENTIN) 875-125 MG tablet Take 1 tablet by mouth 2 (two) times daily.     azelastine (ASTELIN) 0.1 % nasal spray Place into both nostrils.     benazepril (LOTENSIN) 20 MG tablet Take 20 mg by mouth daily.     CARBOPLATIN IV Inject into the vein once a week.     doxepin (SINEQUAN) 10 MG capsule Take 10 mg by mouth at bedtime.     famotidine (PEPCID) 20 MG tablet Take 1 tablet (20 mg total) by mouth daily. 30 tablet 0   gabapentin (NEURONTIN) 300 MG capsule Take 1 capsule by mouth at bedtime.     hydrochlorothiazide (MICROZIDE) 12.5 MG capsule Take 12.5 mg by mouth daily.     HYDROcodone bit-homatropine (HYCODAN) 5-1.5 MG/5ML syrup Take by mouth.     levothyroxine (SYNTHROID) 75 MCG tablet Take 100 mcg by mouth daily before breakfast.     lidocaine-prilocaine (EMLA) cream Apply a small amount to port a cath site and cover with plastic wrap 1 hour prior to infusion appointments 30 g 3   losartan (COZAAR) 50 MG tablet Take 50 mg by mouth daily.     magnesium oxide (MAG-OX) 400 (240 Mg) MG tablet Take 1 tablet (400 mg total) by mouth daily. 60 tablet 2   magnesium oxide (MAG-OX) 400 MG tablet Take 1 tablet by mouth daily.     metoprolol succinate (TOPROL-XL) 25 MG 24 hr tablet Take 25 mg by mouth daily.     nystatin (MYCOSTATIN) 100000 UNIT/ML suspension Take by mouth.     Omega-3 Fatty Acids (FISH OIL PO) Take by mouth.     ondansetron (ZOFRAN) 8 MG tablet Take by mouth.     PACLITAXEL IV Inject into the vein once a week.     Pembrolizumab (KEYTRUDA IV) Inject into the vein every 21 ( twenty-one) days.     psyllium (METAMUCIL) 58.6 % packet Take by mouth.     rosuvastatin (CRESTOR) 5 MG tablet Take 5 mg by mouth daily.     triamcinolone cream (KENALOG) 0.1 % Apply 1 application  topically  See admin instructions. Two to three times daily     filgrastim (NEUPOGEN) 480 MCG/0.8ML SOSY injection Inject 0.8 mLs (480 mcg total) into the skin daily as needed for up to 8 doses (self-administer subcutaneously as directed by Dr. Delton Coombes as needed for neutropenia). (Patient not taking: Reported on 07/17/2021) 6.4 mL 0   HYDROcodone-acetaminophen (NORCO) 5-325 MG tablet Take 1 tablet by mouth every 12 (twelve) hours as needed for moderate pain. (Patient not taking: Reported on 07/17/2021) 20 tablet 0   Melatonin 5 MG TABS Take 5 mg by mouth. As needed for sleep (Patient not taking: Reported on 07/17/2021)     prochlorperazine (COMPAZINE) 10 MG tablet Take 1 tablet (10 mg total) by mouth every 6 (six) hours as needed (Nausea or vomiting). (Patient not taking: Reported on 07/17/2021) 30 tablet 1   No current facility-administered medications for this visit.   Facility-Administered Medications Ordered in Other Visits  Medication Dose Route Frequency Provider Last Rate Last Admin   0.9 %  sodium chloride infusion   Intravenous Continuous Derek Jack, MD 50 mL/hr at 07/17/21 1424 New Bag at 07/17/21 1424   magnesium sulfate IVPB 2 g 50 mL  2 g Intravenous Q1 Hr x 2 Derek Jack, MD 50 mL/hr at 07/17/21 1435 2 g at 07/17/21 1435    ALLERGIES:  Allergies  Allergen Reactions   Exforge [Amlodipine Besylate-Valsartan] Nausea And Vomiting   Cheese    Levofloxacin In D5w Hives   Strawberry Extract Diarrhea    Seeds,nuts, lettuce, grapes   Tramadol Hcl Nausea And Vomiting   Yeast-Related Products Hives    bread    PHYSICAL EXAM:  Performance status (ECOG): 1 - Symptomatic but completely ambulatory  There were no vitals filed for this visit. Wt Readings from Last 3 Encounters:  07/17/21 181 lb 7 oz (82.3 kg)  07/11/21 182 lb 8.7 oz (82.8 kg)  07/07/21 182 lb 9.6 oz (82.8 kg)   Physical Exam Vitals reviewed.  Constitutional:      Appearance: Normal appearance.   HENT:     Mouth/Throat:     Mouth: No oral lesions.  Cardiovascular:     Rate and Rhythm: Normal rate and regular rhythm.     Pulses: Normal pulses.     Heart sounds: Normal heart sounds.  Pulmonary:     Effort: Pulmonary effort is normal.     Breath sounds: Normal breath sounds.  Neurological:     General: No focal deficit present.     Mental Status: She is alert and oriented to person, place, and time.  Psychiatric:        Mood and Affect: Mood normal.        Behavior: Behavior normal.     LABORATORY DATA:  I have reviewed the labs as listed.  CBC Latest Ref Rng & Units 07/17/2021 07/11/2021 07/04/2021  WBC 4.0 - 10.5 K/uL 5.4 0.3(LL) 7.6  Hemoglobin 12.0 - 15.0 g/dL 8.2(L) 8.6(L) 9.4(L)  Hematocrit 36.0 - 46.0 % 24.1(L) 25.0(L) 28.9(L)  Platelets 150 - 400 K/uL 89(L) 109(L) 228   CMP Latest Ref Rng & Units 07/17/2021 07/11/2021 07/03/2021  Glucose 70 - 99 mg/dL 130(H) 111(H) 95  BUN 8 - 23 mg/dL _0 Creatinine 0.44 - 1.00 mg/dL 0.59 0.79 0.68  Sodium 135 - 145 mmol/L 139 131(L) 134(L)  Potassium 3.5 - 5.1 mmol/L 2.9(L) 3.7 4.2  Chloride 98 - 111 mmol/L 105 99 106  CO2 22 - 32 mmol/L _1 Calcium  8.9 - 10.3 mg/dL 8.4(L) 8.7(L) 8.4(L)  Total Protein 6.5 - 8.1 g/dL 6.4(L) 7.4 6.7  Total Bilirubin 0.3 - 1.2 mg/dL 0.4 0.6 0.6  Alkaline Phos 38 - 126 U/L 72 73 64  AST 15 - 41 U/L _0 ALT 0 - 44 U/L _1 DIAGNOSTIC IMAGING:  I have independently reviewed the scans and discussed with the patient. DG Chest 2 View  Result Date: 06/22/2021 CLINICAL DATA:  70 year old female with a history of shortness of breath and congestion EXAM: CHEST - 2 VIEW COMPARISON:  04/19/2021, 02/17/2021, PET-CT 04/04/2021 FINDINGS: Cardiomediastinal silhouette unchanged in size and contour. No evidence of central vascular congestion. No interlobular septal thickening. Unchanged right subclavian port catheter. Nodules of the left chest unchanged. Asymmetric elevation the right  hemidiaphragm. No pneumothorax or pleural effusion. Coarsened interstitial markings, with no confluent airspace disease. No acute displaced fracture. Degenerative changes of the spine. IMPRESSION: Similar appearance of chronic changes and left lung nodules with no evidence of acute cardiopulmonary disease. Electronically Signed   By: Corrie Mckusick D.O.   On: 06/22/2021 08:45   DG Chest Port 1 View  Result Date: 07/11/2021 CLINICAL DATA:  Shortness of breath. Additional history provided: Patient with a history of breast cancer, inability to eat due to vomiting, shortness of breath. EXAM: PORTABLE CHEST 1 VIEW COMPARISON:  Prior chest radiographs 06/19/2021 and earlier. FINDINGS: Redemonstrated right chest infusion port catheter with tip projecting at the level of the superior cavoatrial junction. Mild cardiomegaly, unchanged. Aortic atherosclerosis. Redemonstrated small calcified granuloma within the left upper lobe. Grossly unchanged nodular opacity within the left mid-lung field, corresponding with a known left lower lobe pulmonary nodule. Otherwise, there is no appreciable airspace consolidation. No evidence of pleural effusion or pneumothorax. As before, there is elevation of the right hemidiaphragm. No displaced fracture. Degenerative changes of the spine. IMPRESSION: No evidence of acute cardiopulmonary abnormality. Grossly unchanged left lower lobe pulmonary nodule. Mild cardiomegaly, also unchanged. Aortic Atherosclerosis (ICD10-I70.0). Electronically Signed   By: Kellie Simmering D.O.   On: 07/11/2021 14:54     ASSESSMENT:  1.  T3N3c (stage IIIc) triple negative invasive lobular carcinoma of the left breast: - She felt lump in her left breast for more than 6 months, but thought it was secondary to fibrocystic disease which she had all her life.  When she started having pain, she reached out to Dr. Sherrie Sport. - She previously had mammogram machine malfunction and had severe pain and traumatized by that  experience.  She was having ultrasound of the breast every other year since then. - She was on hormone replacement therapy for close to 10 years (from age [age]). - Ultrasound of the left breast on 01/08/2021 showed hyper vascular hypoechoic mass at 12 o'clock position measuring 3.8 x 1.3 x 2.5 cm.  There are calcifications located within the mass.  Mass extends to the level of the skin with mild associated skin thickening.  At least 3 morphologically abnormal lymph nodes in the left axilla. - Mammogram showed irregular spiculated mass with associated calcifications in the retroareolar to upper left breast measuring approximately 6 cm.  There are at least 4 morphologically abnormal lymph nodes identified in the left axilla. - Ultrasound-guided left breast and left axillary lymph node biopsy on 01/15/2021 - Pathology consistent with invasive lobular carcinoma, E-cadherin negative.  ER/PR/HER2 negative.  HER2 2+ by IHC, negative by FISH.  Ki-67 is 5%.  Lymph node core biopsy was consistent with metastatic  carcinoma.  Grade 2. - PET scan on 02/03/2021 showed involvement of left supraclavicular, subpectoral, axillary lymph nodes along with the breast mass.  10 mm left lung nodule which is hypometabolic.  Spinal cord lesion at T12 level with a strong uptake. - MRI of the lumbar spine with and without contrast on 02/20/2021 showed no mass or abnormal enhancement within the canal at the T12 level to correspond to the site of PET scan positive.  No marrow replacing bone lesion. - 2D echo on 02/21/2021 with EF 55-60%. - Weekly carboplatin and paclitaxel with every 3 weeks pembrolizumab (keynote-522) started on 02/27/2021. - Cycle 1 of AC on 07/04/2021.   2.  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  She is non-smoker. - Mother died of breast cancer.  Maternal grandmother died very young in her 66s, sister has fibrocystic disease.  Father died of lung cancer and was a  smoker.   PLAN:  1.  T3N3c triple negative invasive lobular carcinoma of the left breast: - She received cycle 1 of AC on 07/04/2021 and G-CSF on 07/07/2021. - On 07/06/2021 she had diarrhea with greenish watery stools lasted about 4 hours. - She went back to work on 07/07/2021 and noticed her cough getting worse.  She went to the ER on 07/11/2021 and was found to have neutropenic fever.  She was admitted to [facility] and was released on 07/14/2021.  She is currently taking Augmentin twice daily.  She had to receive 1 unit of PRBC. - She has some cough and feeling weak. - Reviewed labs today which showed normal creatinine and LFTs.  CBC shows white count 5.4.  Platelet count is 89.  Hemoglobin 8.2. - We will hold off on any treatment this week.  We will reevaluate her next Friday.  We will give her fluids today and tomorrow. - We will consider cutting back on the dose of AC for cycle 2.  2.  Hypothyroidism: - Continue Synthroid 75 mcg daily.  3.  Hypokalemia: - Potassium 2.9 today.  We have given potassium 40 mEq in the office.  If continues to be low, will start her on potassium supplements.  4.  Left wrist fracture: - She had a left wrist fracture after fall on 05/02/2021.  This has healed completely.  5.  Peripheral neuropathy: - Numbness in the toes lasting most part of the time during the day.  No medical intervention needed.  6.  Hypomagnesemia: - Magnesium today is 1.4.  She will receive 4 g of magnesium IV. - Continue magnesium twice daily at home.   Orders placed this encounter:  No orders of the defined types were placed in this encounter.    Derek Jack, MD [place] [phone]   I, Thana Ates, am acting as a scribe for Dr. Derek Jack.  I, Derek Jack MD, have reviewed the above documentation for accuracy and completeness, and I agree with the above.

## 2021-07-17 NOTE — Patient Instructions (Signed)
[facility]  Discharge Instructions: Thank you for choosing [facility] to provide your oncology and hematology care.  If you have a lab appointment with the Cancer Center, please come in thru the Main Entrance and check in at the main information desk.  Wear comfortable clothing and clothing appropriate for easy access to any Portacath or PICC line.   We strive to give you quality time with your provider. You may need to reschedule your appointment if you arrive late (15 or more minutes).  Arriving late affects you and other patients whose appointments are after yours.  Also, if you miss three or more appointments without notifying the office, you may be dismissed from the clinic at the provider's discretion.      For prescription refill requests, have your pharmacy contact our office and allow 72 hours for refills to be completed.        To help prevent nausea and vomiting after your treatment, we encourage you to take your nausea medication as directed.  BELOW ARE SYMPTOMS THAT SHOULD BE REPORTED IMMEDIATELY: *FEVER GREATER THAN 100.4 F (38 C) OR HIGHER *CHILLS OR SWEATING *NAUSEA AND VOMITING THAT IS NOT CONTROLLED WITH YOUR NAUSEA MEDICATION *UNUSUAL SHORTNESS OF BREATH *UNUSUAL BRUISING OR BLEEDING *URINARY PROBLEMS (pain or burning when urinating, or frequent urination) *BOWEL PROBLEMS (unusual diarrhea, constipation, pain near the anus) TENDERNESS IN MOUTH AND THROAT WITH OR WITHOUT PRESENCE OF ULCERS (sore throat, sores in mouth, or a toothache) UNUSUAL RASH, SWELLING OR PAIN  UNUSUAL VAGINAL DISCHARGE OR ITCHING   Items with * indicate a potential emergency and should be followed up as soon as possible or go to the Emergency Department if any problems should occur.  Please show the CHEMOTHERAPY ALERT CARD or IMMUNOTHERAPY ALERT CARD at check-in to the Emergency Department and triage nurse.  Should you have questions after your visit or need to cancel  or reschedule your appointment, please contact [facility] [phone]  and follow the prompts.  Office hours are 8:00 a.m. to 4:30 p.m. Monday - Friday. Please note that voicemails left after 4:00 p.m. may not be returned until the following business day.  We are closed weekends and major holidays. You have access to a nurse at all times for urgent questions. Please call the main number to the clinic [phone] and follow the prompts.  For any non-urgent questions, you may also contact your provider using MyChart. We now offer e-Visits for anyone 18 and older to request care online for non-urgent symptoms. For details visit [email or web address].   Also download the MyChart app! Go to the app store, search "MyChart", open the app, select , and log in with your MyChart username and password.  Due to Covid, a mask is required upon entering the hospital/clinic. If you do not have a mask, one will be given to you upon arrival. For doctor visits, patients may have 1 support person aged 18 or older with them. For treatment visits, patients cannot have anyone with them due to current Covid guidelines and our immunocompromised population.  

## 2021-07-17 NOTE — Telephone Encounter (Signed)
Received tc from patient stating that she was discharged from [facility] on Tuesday with a hgb of 7.7.  She was admitted with febrile neutropenia and rhinovirus.  States that she feels very weak and has been vomiting from excessive drainage.  Denies fever today and is on Augmentin from hospital.  Discussed with Dr Delton Coombes and added to schedule for office visit with labs.  Patient aware.

## 2021-07-17 NOTE — Progress Notes (Signed)
Patient presents today for port flush and lab and possible IVF if needed.  Patient was recently admitted inpatient for rhinovirus.  She still has persistent symptoms.  Vital signs are stable.    Potassium today is 2.9 and magnesium is 1.4.  We will proceed with standing orders and give 4 grams of magnesium and 40 mEq of potassium PO.  Patient will be evaluated by Dr. Delton Coombes in an office visit and I will note any treatment changes.    Patient evaluated by Dr. Delton Coombes.  No further treatment today.  Patient tolerated treatment well with no complaints voiced.  Patient left ambulatory in stable condition.  Vital signs stable at discharge.  Follow up as scheduled.

## 2021-07-18 ENCOUNTER — Encounter (HOSPITAL_COMMUNITY): Payer: Self-pay

## 2021-07-18 ENCOUNTER — Inpatient Hospital Stay (HOSPITAL_COMMUNITY): Payer: Medicare Other

## 2021-07-18 VITALS — BP 159/85 | HR 75 | Temp 97.9°F | Resp 18

## 2021-07-18 DIAGNOSIS — R052 Subacute cough: Secondary | ICD-10-CM

## 2021-07-18 DIAGNOSIS — E039 Hypothyroidism, unspecified: Secondary | ICD-10-CM | POA: Diagnosis not present

## 2021-07-18 DIAGNOSIS — G629 Polyneuropathy, unspecified: Secondary | ICD-10-CM | POA: Diagnosis not present

## 2021-07-18 DIAGNOSIS — D702 Other drug-induced agranulocytosis: Secondary | ICD-10-CM

## 2021-07-18 DIAGNOSIS — C50812 Malignant neoplasm of overlapping sites of left female breast: Secondary | ICD-10-CM | POA: Diagnosis not present

## 2021-07-18 DIAGNOSIS — R911 Solitary pulmonary nodule: Secondary | ICD-10-CM | POA: Diagnosis not present

## 2021-07-18 DIAGNOSIS — Z171 Estrogen receptor negative status [ER-]: Secondary | ICD-10-CM | POA: Diagnosis not present

## 2021-07-18 MED ORDER — HEPARIN SOD (PORK) LOCK FLUSH 100 UNIT/ML IV SOLN
500.0000 [IU] | Freq: Once | INTRAVENOUS | Status: AC
  Administered 2021-07-18: 500 [IU] via INTRAVENOUS

## 2021-07-18 MED ORDER — SODIUM CHLORIDE 0.9% FLUSH
10.0000 mL | Freq: Once | INTRAVENOUS | Status: AC
  Administered 2021-07-18: 10 mL

## 2021-07-18 MED ORDER — BENZONATATE 100 MG PO CAPS: 100.0000 mg | ORAL_CAPSULE | Freq: Three times a day (TID) | ORAL | 0 refills | Status: DC | PRN

## 2021-07-18 MED ORDER — MAGNESIUM SULFATE 2 GM/50ML IV SOLN
2.0000 g | Freq: Once | INTRAVENOUS | Status: AC
  Administered 2021-07-18: 2 g via INTRAVENOUS
  Filled 2021-07-18: qty 50

## 2021-07-18 MED ORDER — POTASSIUM CHLORIDE IN NACL 20-0.9 MEQ/L-% IV SOLN
Freq: Once | INTRAVENOUS | Status: AC
  Filled 2021-07-18: qty 1000

## 2021-07-18 NOTE — Patient Instructions (Signed)
[facility]  Discharge Instructions: Thank you for choosing [facility] to provide your oncology and hematology care.  If you have a lab appointment with the [facility], please come in thru the Main Entrance and check in at the main information desk.  Wear comfortable clothing and clothing appropriate for easy access to any Portacath or PICC line.   We strive to give you quality time with your provider. You may need to reschedule your appointment if you arrive late (15 or more minutes).  Arriving late affects you and other patients whose appointments are after yours.  Also, if you miss three or more appointments without notifying the office, you may be dismissed from the clinic at the provider's discretion.      For prescription refill requests, have your pharmacy contact our office and allow 72 hours for refills to be completed.    Today you received the following: House fluids.       To help prevent nausea and vomiting after your treatment, we encourage you to take your nausea medication as directed.  BELOW ARE SYMPTOMS THAT SHOULD BE REPORTED IMMEDIATELY: *FEVER GREATER THAN 100.4 F (38 C) OR HIGHER *CHILLS OR SWEATING *NAUSEA AND VOMITING THAT IS NOT CONTROLLED WITH YOUR NAUSEA MEDICATION *UNUSUAL SHORTNESS OF BREATH *UNUSUAL BRUISING OR BLEEDING *URINARY PROBLEMS (pain or burning when urinating, or frequent urination) *BOWEL PROBLEMS (unusual diarrhea, constipation, pain near the anus) TENDERNESS IN MOUTH AND THROAT WITH OR WITHOUT PRESENCE OF ULCERS (sore throat, sores in mouth, or a toothache) UNUSUAL RASH, SWELLING OR PAIN  UNUSUAL VAGINAL DISCHARGE OR ITCHING   Items with * indicate a potential emergency and should be followed up as soon as possible or go to the Emergency Department if any problems should occur.  Please show the CHEMOTHERAPY ALERT CARD or IMMUNOTHERAPY ALERT CARD at check-in to the Emergency Department and triage nurse.  Should  you have questions after your visit or need to cancel or reschedule your appointment, please contact [facility] [phone]  and follow the prompts.  Office hours are 8:00 a.m. to 4:30 p.m. Monday - Friday. Please note that voicemails left after 4:00 p.m. may not be returned until the following business day.  We are closed weekends and major holidays. You have access to a nurse at all times for urgent questions. Please call the main number to the clinic [phone] and follow the prompts.  For any non-urgent questions, you may also contact your provider using MyChart. We now offer e-Visits for anyone 11 and older to request care online for non-urgent symptoms. For details visit [email or web address].   Also download the MyChart app! Go to the app store, search "MyChart", open the app, select [facility], and log in with your MyChart username and password.  Due to Covid, a mask is required upon entering the hospital/clinic. If you do not have a mask, one will be given to you upon arrival. For doctor visits, patients may have 1 support person aged 74 or older with them. For treatment visits, patients cannot have anyone with them due to current Covid guidelines and our immunocompromised population.

## 2021-07-18 NOTE — Progress Notes (Signed)
Patient presents today for House Fluids. Vital signs stable. Patient has complaints of cough and chest pain that was present yesterday and last night. Patient states, " last night when I coughed it went down both arms and across my chest." Patient states, " my mother had heart problems and breast cancer and she had a cough." Patient recently hospitalized for Rhinovirus. Patient currently on antibiotics . Requested PA to see patient pertaining to complaints. Per Dr. Delton Coombes proceed with House fluids today.    Patient seen by [place] Co PA.   House fluids given today per MD orders. Tolerated infusion without adverse affects. Vital signs stable. No complaints at this time. Discharged from clinic ambulatory in stable condition. Alert and oriented x 3. F/U with [facility] as scheduled.

## 2021-07-18 NOTE — Progress Notes (Addendum)
Brief evaluation of patient today due to cough and chest pain.  Patient was hospitalized with upper respiratory infection several weeks ago and has had persistent cough since that time.  Cough is slowly improving but still causing significant symptoms for the patient.  She reports that when she was coughing last night she experienced a chest tightness and pain that radiated across her chest and both arms, associated with some soreness in her shoulders.  She reports that the pain occurred "every time she coughed," and that it got worse the more that she coughed.  Once she stops coughing in the morning, the pain resolves.  Cough is worse at night.  Productive of phlegm.  No fever or chills.  Her chest wall is tender to palpation on exam today.  She has Robitussin and Mucinex at home, which she is encouraged to use.  She reports that they have been helpful in the past.  We will also send prescription for Tessalon Perles.  Offered prescription of Voltaren gel to help with costochondritis, but patient declined.  Suspect costochondritis as the cause of chest pain in the setting of severe and prolonged coughing fits.  No current concern for cardiac chest pain based on presentation.

## 2021-07-21 DIAGNOSIS — J40 Bronchitis, not specified as acute or chronic: Secondary | ICD-10-CM | POA: Diagnosis not present

## 2021-07-21 DIAGNOSIS — D703 Neutropenia due to infection: Secondary | ICD-10-CM | POA: Diagnosis not present

## 2021-07-23 NOTE — Progress Notes (Shared)
[place], [place] [address]   CLINIC:  Medical Oncology/Hematology  PCP:  Neale Burly, MD [place] / [place] [phone]   REASON FOR VISIT:  Follow-up for ***  PRIOR THERAPY: ***  NGS Results: ***  CURRENT THERAPY: ***  BRIEF ONCOLOGIC HISTORY:  Oncology History  Invasive lobular carcinoma of left breast in female ([place])  01/23/2021 Initial Diagnosis   Invasive lobular carcinoma of left breast in female [facility])   02/19/2021 Genetic Testing   Negative genetic testing on the CancerNext-Expanded+RNAinsight panel.  SMARCB1 VUS identified.  The CancerNext-Expanded gene panel offered by [facility] and includes sequencing and rearrangement analysis for the following 77 genes: AIP, ALK, APC*, ATM*, AXIN2, BAP1, BARD1, BLM, BMPR1A, BRCA1*, BRCA2*, BRIP1*, CDC73, CDH1*, CDK4, CDKN1B, CDKN2A, CHEK2*, CTNNA1, DICER1, FANCC, FH, FLCN, GALNT12, KIF1B, LZTR1, MAX, MEN1, MET, MLH1*, MSH2*, MSH3, MSH6*, MUTYH*, NBN, NF1*, NF2, NTHL1, PALB2*, PHOX2B, PMS2*, POT1, PRKAR1A, PTCH1, PTEN*, RAD51C*, RAD51D*, RB1, RECQL, RET, SDHA, SDHAF2, SDHB, SDHC, SDHD, SMAD4, SMARCA4, SMARCB1, SMARCE1, STK11, SUFU, TMEM127, TP53*, TSC1, TSC2, VHL and XRCC2 (sequencing and deletion/duplication); EGFR, EGLN1, HOXB13, KIT, MITF, PDGFRA, POLD1, and POLE (sequencing only); EPCAM and GREM1 (deletion/duplication only). DNA and RNA analyses performed for * genes. The report date is February 19, 2021.   02/27/2021 -  Chemotherapy   Patient is on Treatment Plan : BREAST Pembrolizumab + Carboplatin D1,8,15+ Paclitaxel D1,8,15 q21d X 4 cycles / Pembrolizumab + AC q21d x 4 cycles       CANCER STAGING: Cancer Staging Invasive lobular carcinoma of left breast in female [facility]) Staging form: Breast, AJCC 8th Edition - Clinical stage from 01/23/2021: cT3, cN3c, G2, ER-, PR-, HER2- - Unsigned   INTERVAL HISTORY:  [name], a 70 y.o. female, returns for routine  follow-up of her ***. Denise [name] was last seen on {XX/XX/XXXX}.    REVIEW OF SYSTEMS:  Review of Systems - Oncology  PAST MEDICAL/SURGICAL HISTORY:  Past Medical History:  Diagnosis Date   Asthma    as child   Cancer ([place]) 12/2020   left breast IMC   Complication of anesthesia    pateitn states,' I coded when I had my D&Cmany years ago.   Family history of breast cancer    Hypertension    Hypothyroidism    PONV (postoperative nausea and vomiting)    Port-A-Cath in place 02/26/2021   Past Surgical History:  Procedure Laterality Date   ABDOMINAL HYSTERECTOMY     BIOPSY  01/17/2018   Procedure: BIOPSY;  Surgeon: Rogene Houston, MD;  Location: [facility];  Service: Endoscopy;;  duodenum,gastric   CHOLECYSTECTOMY     COLONOSCOPY WITH PROPOFOL N/A 01/17/2018   Procedure: COLONOSCOPY WITH PROPOFOL;  Surgeon: Rogene Houston, MD;  Location: [facility];  Service: Endoscopy;  Laterality: N/A;  7:30   DILATION AND CURETTAGE OF UTERUS     ESOPHAGOGASTRODUODENOSCOPY (EGD) WITH PROPOFOL N/A 01/17/2018   Procedure: ESOPHAGOGASTRODUODENOSCOPY (EGD) WITH PROPOFOL;  Surgeon: Rogene Houston, MD;  Location: [facility];  Service: Endoscopy;  Laterality: N/A;   POLYPECTOMY  01/17/2018   Procedure: POLYPECTOMY;  Surgeon: Rogene Houston, MD;  Location: [facility];  Service: Endoscopy;;  transverse colon, cecal   PORTACATH PLACEMENT Right 02/17/2021   Procedure: INSERTION PORT-A-CATH;  Surgeon: Coralie Keens, MD;  Location: [facility];  Service: General;  Laterality: Right;    SOCIAL HISTORY:  Social History   Socioeconomic History  Marital status: Widowed    Spouse name: Not on file   Number of children: 3   Years of education: Not on file   Highest education level: Not on file  Occupational History   Occupation: [employer]   Occupation: retired  Tobacco Use   Smoking status: Never   Smokeless tobacco: Never  Scientific laboratory technician Use: Never used   Substance and Sexual Activity   Alcohol use: Never   Drug use: Never   Sexual activity: Not Currently    Birth control/protection: Surgical  Other Topics Concern   Not on file  Social History Narrative   Not on file   Social Determinants of Health   Financial Resource Strain: Low Risk    Difficulty of Paying Living Expenses: Not hard at all  Food Insecurity: No Food Insecurity   Worried About Charity fundraiser in the Last Year: Never true   [place] in the Last Year: Never true  Transportation Needs: No Transportation Needs   Lack of Transportation (Medical): No   Lack of Transportation (Non-Medical): No  Physical Activity: Insufficiently Active   Days of Exercise per Week: 3 days   Minutes of Exercise per Session: 30 min  Stress: No Stress Concern Present   Feeling of Stress : Not at all  Social Connections: Moderately Integrated   Frequency of Communication with Friends and Family: More than three times a week   Frequency of Social Gatherings with Friends and Family: More than three times a week   Attends Religious Services: More than 4 times per year   Active Member of Clubs or Organizations: No   Attends Music therapist: More than 4 times per year   Marital Status: Widowed  Human resources officer Violence: Not At Risk   Fear of Current or Ex-Partner: No   Emotionally Abused: No   Physically Abused: No   Sexually Abused: No    FAMILY HISTORY:  Family History  Problem Relation Age of Onset   Breast cancer Mother        dx in her 64s   Heart disease Mother    Thyroid disease Mother    Lung cancer Father        dx in his 74s   Thyroid disease Maternal Aunt    Thyroid nodules Maternal Grandmother 35       goiter   Thyroid disease Maternal Grandmother    Heart disease Maternal Grandfather    Heart disease Paternal Grandmother    Heart disease Paternal Grandfather    Thyroid disease Daughter    Thyroid disease Daughter    Cancer Maternal Uncle         NOS   Cancer Paternal Uncle        NOS   Breast cancer Cousin        pat first cousin died in her 62s;     CURRENT MEDICATIONS:  Current Outpatient Medications  Medication Sig Dispense Refill   Acetaminophen (TYLENOL PO) Take 1 tablet by mouth as needed.     albuterol (VENTOLIN HFA) 108 (90 Base) MCG/ACT inhaler Inhale into the lungs.     amoxicillin (AMOXIL) 875 MG tablet Take 875 mg by mouth 2 (two) times daily.     amoxicillin-clavulanate (AUGMENTIN) 875-125 MG tablet Take 1 tablet by mouth 2 (two) times daily.     azelastine (ASTELIN) 0.1 % nasal spray Place into both nostrils.     benazepril (LOTENSIN) 20 MG  tablet Take 20 mg by mouth daily.     benzonatate (TESSALON) 100 MG capsule Take 1 capsule (100 mg total) by mouth 3 (three) times daily as needed for cough. 60 capsule 0   CARBOPLATIN IV Inject into the vein once a week.     doxepin (SINEQUAN) 10 MG capsule Take 10 mg by mouth at bedtime.     famotidine (PEPCID) 20 MG tablet Take 1 tablet (20 mg total) by mouth daily. 30 tablet 0   filgrastim (NEUPOGEN) 480 MCG/0.8ML SOSY injection Inject 0.8 mLs (480 mcg total) into the skin daily as needed for up to 8 doses (self-administer subcutaneously as directed by Dr. Delton Coombes as needed for neutropenia). (Patient not taking: Reported on 07/17/2021) 6.4 mL 0   gabapentin (NEURONTIN) 300 MG capsule Take 1 capsule by mouth at bedtime.     hydrochlorothiazide (MICROZIDE) 12.5 MG capsule Take 12.5 mg by mouth daily.     HYDROcodone-acetaminophen (NORCO) 5-325 MG tablet Take 1 tablet by mouth every 12 (twelve) hours as needed for moderate pain. (Patient not taking: Reported on 07/17/2021) 20 tablet 0   levothyroxine (SYNTHROID) 75 MCG tablet Take 100 mcg by mouth daily before breakfast.     lidocaine-prilocaine (EMLA) cream Apply a small amount to port a cath site and cover with plastic wrap 1 hour prior to infusion appointments 30 g 3   losartan (COZAAR) 50 MG tablet Take 50 mg by mouth  daily.     magnesium oxide (MAG-OX) 400 (240 Mg) MG tablet Take 1 tablet (400 mg total) by mouth daily. 60 tablet 2   magnesium oxide (MAG-OX) 400 MG tablet Take 1 tablet by mouth daily.     Melatonin 5 MG TABS Take 5 mg by mouth. As needed for sleep (Patient not taking: Reported on 07/17/2021)     metoprolol succinate (TOPROL-XL) 25 MG 24 hr tablet Take 25 mg by mouth daily.     nystatin (MYCOSTATIN) 100000 UNIT/ML suspension Take by mouth.     Omega-3 Fatty Acids (FISH OIL PO) Take by mouth.     ondansetron (ZOFRAN) 8 MG tablet Take by mouth.     PACLITAXEL IV Inject into the vein once a week.     Pembrolizumab (KEYTRUDA IV) Inject into the vein every 21 ( twenty-one) days.     prochlorperazine (COMPAZINE) 10 MG tablet Take 1 tablet (10 mg total) by mouth every 6 (six) hours as needed (Nausea or vomiting). (Patient not taking: Reported on 07/17/2021) 30 tablet 1   psyllium (METAMUCIL) 58.6 % packet Take by mouth.     rosuvastatin (CRESTOR) 5 MG tablet Take 5 mg by mouth daily.     triamcinolone cream (KENALOG) 0.1 % Apply 1 application topically See admin instructions. Two to three times daily     No current facility-administered medications for this visit.    ALLERGIES:  Allergies  Allergen Reactions   Exforge [Amlodipine Besylate-Valsartan] Nausea And Vomiting   Cheese    Levofloxacin In D5w Hives   Strawberry Extract Diarrhea    Seeds,nuts, lettuce, grapes   Tramadol Hcl Nausea And Vomiting   Yeast-Related Products Hives    bread    PHYSICAL EXAM:  Performance status (ECOG): {CHL ONC FR:[id number]}  There were no vitals filed for this visit. Wt Readings from Last 3 Encounters:  07/17/21 181 lb 7 oz (82.3 kg)  07/11/21 182 lb 8.7 oz (82.8 kg)  07/07/21 182 lb 9.6 oz (82.8 kg)   Physical Exam   LABORATORY DATA:  I  have reviewed the labs as listed.  CBC Latest Ref Rng & Units 07/17/2021 07/11/2021 07/04/2021  WBC 4.0 - 10.5 K/uL 5.4 0.3(LL) 7.6  Hemoglobin 12.0 - 15.0  g/dL 8.2(L) 8.6(L) 9.4(L)  Hematocrit 36.0 - 46.0 % 24.1(L) 25.0(L) 28.9(L)  Platelets 150 - 400 K/uL 89(L) 109(L) 228   CMP Latest Ref Rng & Units 07/17/2021 07/11/2021 07/03/2021  Glucose 70 - 99 mg/dL 130(H) 111(H) 95  BUN 8 - 23 mg/dL '8 17 16  ' Creatinine 0.44 - 1.00 mg/dL 0.59 0.79 0.68  Sodium 135 - 145 mmol/L 139 131(L) 134(L)  Potassium 3.5 - 5.1 mmol/L 2.9(L) 3.7 4.2  Chloride 98 - 111 mmol/L 105 99 106  CO2 22 - 32 mmol/L '26 23 24  ' Calcium 8.9 - 10.3 mg/dL 8.4(L) 8.7(L) 8.4(L)  Total Protein 6.5 - 8.1 g/dL 6.4(L) 7.4 6.7  Total Bilirubin 0.3 - 1.2 mg/dL 0.4 0.6 0.6  Alkaline Phos 38 - 126 U/L 72 73 64  AST 15 - 41 U/L '18 17 15  ' ALT 0 - 44 U/L '15 19 15    ' DIAGNOSTIC IMAGING:  I have independently reviewed the scans and discussed with the patient. DG Chest Port 1 View  Result Date: 07/11/2021 CLINICAL DATA:  Shortness of breath. Additional history provided: Patient with a history of breast cancer, inability to eat due to vomiting, shortness of breath. EXAM: PORTABLE CHEST 1 VIEW COMPARISON:  Prior chest radiographs 06/19/2021 and earlier. FINDINGS: Redemonstrated right chest infusion port catheter with tip projecting at the level of the superior cavoatrial junction. Mild cardiomegaly, unchanged. Aortic atherosclerosis. Redemonstrated small calcified granuloma within the left upper lobe. Grossly unchanged nodular opacity within the left mid-lung field, corresponding with a known left lower lobe pulmonary nodule. Otherwise, there is no appreciable airspace consolidation. No evidence of pleural effusion or pneumothorax. As before, there is elevation of the right hemidiaphragm. No displaced fracture. Degenerative changes of the spine. IMPRESSION: No evidence of acute cardiopulmonary abnormality. Grossly unchanged left lower lobe pulmonary nodule. Mild cardiomegaly, also unchanged. Aortic Atherosclerosis (ICD10-I70.0). Electronically Signed   By: Kellie Simmering D.O.   On: 07/11/2021 14:54      ASSESSMENT:  ***   PLAN:  ***   Orders placed this encounter:  No orders of the defined types were placed in this encounter.    Derek Jack, MD [facility] [phone]   I, ***, am acting as a scribe for Dr. Derek Jack.  {Add Barista Statement}

## 2021-07-24 ENCOUNTER — Ambulatory Visit (HOSPITAL_COMMUNITY): Payer: Medicare Other | Admitting: Hematology

## 2021-07-24 ENCOUNTER — Ambulatory Visit (HOSPITAL_COMMUNITY): Payer: Medicare Other

## 2021-07-24 ENCOUNTER — Other Ambulatory Visit (HOSPITAL_COMMUNITY): Payer: Medicare Other

## 2021-07-25 ENCOUNTER — Inpatient Hospital Stay (HOSPITAL_BASED_OUTPATIENT_CLINIC_OR_DEPARTMENT_OTHER): Payer: Medicare Other | Admitting: Hematology

## 2021-07-25 ENCOUNTER — Other Ambulatory Visit: Payer: Self-pay

## 2021-07-25 ENCOUNTER — Ambulatory Visit (HOSPITAL_COMMUNITY): Payer: Medicare Other | Admitting: Hematology

## 2021-07-25 ENCOUNTER — Other Ambulatory Visit (HOSPITAL_COMMUNITY): Payer: Medicare Other

## 2021-07-25 ENCOUNTER — Inpatient Hospital Stay (HOSPITAL_COMMUNITY): Payer: Medicare Other

## 2021-07-25 ENCOUNTER — Ambulatory Visit (HOSPITAL_COMMUNITY): Payer: Medicare Other

## 2021-07-25 VITALS — BP 120/78 | HR 96 | Temp 98.8°F | Resp 20 | Wt 176.8 lb

## 2021-07-25 DIAGNOSIS — C50912 Malignant neoplasm of unspecified site of left female breast: Secondary | ICD-10-CM

## 2021-07-25 DIAGNOSIS — E039 Hypothyroidism, unspecified: Secondary | ICD-10-CM | POA: Diagnosis not present

## 2021-07-25 DIAGNOSIS — G629 Polyneuropathy, unspecified: Secondary | ICD-10-CM | POA: Diagnosis not present

## 2021-07-25 DIAGNOSIS — Z171 Estrogen receptor negative status [ER-]: Secondary | ICD-10-CM | POA: Diagnosis not present

## 2021-07-25 DIAGNOSIS — R911 Solitary pulmonary nodule: Secondary | ICD-10-CM | POA: Diagnosis not present

## 2021-07-25 DIAGNOSIS — C50812 Malignant neoplasm of overlapping sites of left female breast: Secondary | ICD-10-CM | POA: Diagnosis not present

## 2021-07-25 LAB — CBC WITH DIFFERENTIAL/PLATELET
Abs Immature Granulocytes: 0.27 10*3/uL — ABNORMAL HIGH (ref 0.00–0.07)
Basophils Absolute: 0.1 10*3/uL (ref 0.0–0.1)
Basophils Relative: 2 %
Eosinophils Absolute: 0 10*3/uL (ref 0.0–0.5)
Eosinophils Relative: 1 %
HCT: 31.2 % — ABNORMAL LOW (ref 36.0–46.0)
Hemoglobin: 10 g/dL — ABNORMAL LOW (ref 12.0–15.0)
Immature Granulocytes: 5 %
Lymphocytes Relative: 27 %
Lymphs Abs: 1.4 10*3/uL (ref 0.7–4.0)
MCH: 34.5 pg — ABNORMAL HIGH (ref 26.0–34.0)
MCHC: 32.1 g/dL (ref 30.0–36.0)
MCV: 107.6 fL — ABNORMAL HIGH (ref 80.0–100.0)
Monocytes Absolute: 0.9 10*3/uL (ref 0.1–1.0)
Monocytes Relative: 17 %
Neutro Abs: 2.6 10*3/uL (ref 1.7–7.7)
Neutrophils Relative %: 48 %
Platelets: 677 10*3/uL — ABNORMAL HIGH (ref 150–400)
RBC: 2.9 MIL/uL — ABNORMAL LOW (ref 3.87–5.11)
RDW: 15.4 % (ref 11.5–15.5)
WBC: 5.3 10*3/uL (ref 4.0–10.5)
nRBC: 0 % (ref 0.0–0.2)

## 2021-07-25 LAB — COMPREHENSIVE METABOLIC PANEL
ALT: 22 U/L (ref 0–44)
AST: 22 U/L (ref 15–41)
Albumin: 3.3 g/dL — ABNORMAL LOW (ref 3.5–5.0)
Alkaline Phosphatase: 85 U/L (ref 38–126)
Anion gap: 8 (ref 5–15)
BUN: 18 mg/dL (ref 8–23)
CO2: 26 mmol/L (ref 22–32)
Calcium: 8.7 mg/dL — ABNORMAL LOW (ref 8.9–10.3)
Chloride: 105 mmol/L (ref 98–111)
Creatinine, Ser: 0.7 mg/dL (ref 0.44–1.00)
GFR, Estimated: >60 mL/min (ref >60)
Glucose, Bld: 117 mg/dL — ABNORMAL HIGH (ref 70–99)
Potassium: 3.9 mmol/L (ref 3.5–5.1)
Sodium: 139 mmol/L (ref 135–145)
Total Bilirubin: 0.6 mg/dL (ref 0.3–1.2)
Total Protein: 7 g/dL (ref 6.5–8.1)

## 2021-07-25 LAB — MAGNESIUM: Magnesium: 1.8 mg/dL (ref 1.7–2.4)

## 2021-07-25 MED ORDER — HEPARIN SOD (PORK) LOCK FLUSH 100 UNIT/ML IV SOLN
500.0000 [IU] | Freq: Once | INTRAVENOUS | Status: AC
  Administered 2021-07-25: 500 [IU] via INTRAVENOUS

## 2021-07-25 MED ORDER — SODIUM CHLORIDE 0.9% FLUSH
10.0000 mL | INTRAVENOUS | Status: DC | PRN
  Administered 2021-07-25: 10 mL via INTRAVENOUS

## 2021-07-25 NOTE — Patient Instructions (Addendum)
[place] at [facility] Discharge Instructions  You were seen and examined today by Dr. Delton Coombes. Your lab work looks much better!  We will cancel your treatment appointment for next week and resume treatment following Thanksgiving.  Please minimize your contact with potential contagions to prevent recurrent infections.    Thank you for choosing [facility] at [facility] to provide your oncology and hematology care.  To afford each patient quality time with our provider, please arrive at least 15 minutes before your scheduled appointment time.   If you have a lab appointment with the [facility] please come in thru the Main Entrance and check in at the main information desk.  You need to re-schedule your appointment should you arrive 10 or more minutes late.  We strive to give you quality time with our providers, and arriving late affects you and other patients whose appointments are after yours.  Also, if you no show three or more times for appointments you may be dismissed from the clinic at the providers discretion.     Again, thank you for choosing [facility].  Our hope is that these requests will decrease the amount of time that you wait before being seen by our physicians.       _____________________________________________________________  Should you have questions after your visit to [facility], please contact our office at [phone] and follow the prompts.  Our office hours are 8:00 a.m. and 4:30 p.m. Monday - Friday.  Please note that voicemails left after 4:00 p.m. may not be returned until the following business day.  We are closed weekends and major holidays.  You do have access to a nurse 24-7, just call the main number to the clinic [phone] and do not press any options, hold on the line and a nurse will answer the phone.    For prescription refill requests, have your pharmacy contact our  office and allow 72 hours.    Due to Covid, you will need to wear a mask upon entering the hospital. If you do not have a mask, a mask will be given to you at the Main Entrance upon arrival. For doctor visits, patients may have 1 support person age 22 or older with them. For treatment visits, patients can not have anyone with them due to social distancing guidelines and our immunocompromised population.

## 2021-07-25 NOTE — Progress Notes (Signed)
[facility] [address] [place] [address]   Patient Care Team: Neale Burly, MD as PCP - General (Internal Medicine) Brien Mates, RN as Oncology Nurse Navigator (Oncology)  SUMMARY OF ONCOLOGIC HISTORY: Oncology History  Invasive lobular carcinoma of left breast in female [facility])  01/23/2021 Initial Diagnosis   Invasive lobular carcinoma of left breast in female [facility])   02/19/2021 Genetic Testing   Negative genetic testing on the CancerNext-Expanded+RNAinsight panel.  SMARCB1 VUS identified.  The CancerNext-Expanded gene panel offered by [facility] and includes sequencing and rearrangement analysis for the following 77 genes: AIP, ALK, APC*, ATM*, AXIN2, BAP1, BARD1, BLM, BMPR1A, BRCA1*, BRCA2*, BRIP1*, CDC73, CDH1*, CDK4, CDKN1B, CDKN2A, CHEK2*, CTNNA1, DICER1, FANCC, FH, FLCN, GALNT12, KIF1B, LZTR1, MAX, MEN1, MET, MLH1*, MSH2*, MSH3, MSH6*, MUTYH*, NBN, NF1*, NF2, NTHL1, PALB2*, PHOX2B, PMS2*, POT1, PRKAR1A, PTCH1, PTEN*, RAD51C*, RAD51D*, RB1, RECQL, RET, SDHA, SDHAF2, SDHB, SDHC, SDHD, SMAD4, SMARCA4, SMARCB1, SMARCE1, STK11, SUFU, TMEM127, TP53*, TSC1, TSC2, VHL and XRCC2 (sequencing and deletion/duplication); EGFR, EGLN1, HOXB13, KIT, MITF, PDGFRA, POLD1, and POLE (sequencing only); EPCAM and GREM1 (deletion/duplication only). DNA and RNA analyses performed for * genes. The report date is February 19, 2021.   02/27/2021 -  Chemotherapy   Patient is on Treatment Plan : BREAST Pembrolizumab + Carboplatin D1,8,15+ Paclitaxel D1,8,15 q21d X 4 cycles / Pembrolizumab + AC q21d x 4 cycles       CHIEF COMPLIANT: Follow-up for left breast cancer   INTERVAL HISTORY: [name] is a 70 y.o. female here today for follow up of her left breast cancer. Her last visit was on 07/17/2021.   Today she reports feeling well. She reports pain in her right chest while coughing; both the CP and the coughing has improved with Tiotropium Bromide-Olodaterol inhaler. She has  a history of asthma due to [place] for which she was using an albuterol inhaler as needed. Her diarrhea has resolved. She reports occasional orthopnea. She denies nausea, vomiting, constipation, and tingling/numbness. The swelling in her legs has improved. She drinks 2 nutritional shakes daily and she tries to eat every 4 hours. She reports continues weakness and fatigue.   REVIEW OF SYSTEMS:   Review of Systems  Constitutional:  Positive for fatigue (30%). Negative for appetite change (90%).  Respiratory:  Positive for cough (improved) and shortness of breath.   Cardiovascular:  Positive for chest pain (3/10 improved). Negative for leg swelling.  Gastrointestinal:  Negative for constipation, diarrhea, nausea and vomiting.  All other systems reviewed and are negative.  I have reviewed the past medical history, past surgical history, social history and family history with the patient and they are unchanged from previous note.   ALLERGIES:   is allergic to exforge [amlodipine besylate-valsartan], cheese, levofloxacin in d5w, strawberry extract, tramadol hcl, and yeast-related products.   MEDICATIONS:  Current Outpatient Medications  Medication Sig Dispense Refill   Acetaminophen (TYLENOL PO) Take 1 tablet by mouth as needed.     albuterol (VENTOLIN HFA) 108 (90 Base) MCG/ACT inhaler Inhale into the lungs.     amoxicillin (AMOXIL) 875 MG tablet Take 875 mg by mouth 2 (two) times daily.     amoxicillin-clavulanate (AUGMENTIN) 875-125 MG tablet Take 1 tablet by mouth 2 (two) times daily.     azelastine (ASTELIN) 0.1 % nasal spray Place into both nostrils.     benazepril (LOTENSIN) 20 MG tablet Take 20 mg by mouth daily.     benzonatate (TESSALON) 100 MG capsule  Take 1 capsule (100 mg total) by mouth 3 (three) times daily as needed for cough. 60 capsule 0   CARBOPLATIN IV Inject into the vein once a week.     doxepin (SINEQUAN) 10 MG capsule Take 10 mg by mouth at bedtime.     famotidine  (PEPCID) 20 MG tablet Take 1 tablet (20 mg total) by mouth daily. 30 tablet 0   filgrastim (NEUPOGEN) 480 MCG/0.8ML SOSY injection Inject 0.8 mLs (480 mcg total) into the skin daily as needed for up to 8 doses (self-administer subcutaneously as directed by Dr. Delton Coombes as needed for neutropenia). 6.4 mL 0   gabapentin (NEURONTIN) 300 MG capsule Take 1 capsule by mouth at bedtime.     hydrochlorothiazide (MICROZIDE) 12.5 MG capsule Take 12.5 mg by mouth daily.     HYDROcodone-acetaminophen (NORCO) 5-325 MG tablet Take 1 tablet by mouth every 12 (twelve) hours as needed for moderate pain. 20 tablet 0   levothyroxine (SYNTHROID) 75 MCG tablet Take 100 mcg by mouth daily before breakfast.     losartan (COZAAR) 50 MG tablet Take 50 mg by mouth daily.     magnesium oxide (MAG-OX) 400 (240 Mg) MG tablet Take 1 tablet (400 mg total) by mouth daily. 60 tablet 2   magnesium oxide (MAG-OX) 400 MG tablet Take 1 tablet by mouth daily.     Melatonin 5 MG TABS Take 5 mg by mouth. As needed for sleep     metoprolol succinate (TOPROL-XL) 25 MG 24 hr tablet Take 25 mg by mouth daily.     nystatin (MYCOSTATIN) 100000 UNIT/ML suspension Take by mouth.     Omega-3 Fatty Acids (FISH OIL PO) Take by mouth.     ondansetron (ZOFRAN) 8 MG tablet Take by mouth.     PACLITAXEL IV Inject into the vein once a week.     Pembrolizumab (KEYTRUDA IV) Inject into the vein every 21 ( twenty-one) days.     psyllium (METAMUCIL) 58.6 % packet Take by mouth.     rosuvastatin (CRESTOR) 5 MG tablet Take 5 mg by mouth daily.     Tiotropium Bromide-Olodaterol (STIOLTO RESPIMAT) 2.5-2.5 MCG/ACT AERS Inhale 1-2 puffs into the lungs as directed.     triamcinolone cream (KENALOG) 0.1 % Apply 1 application topically See admin instructions. Two to three times daily     lidocaine-prilocaine (EMLA) cream Apply a small amount to port a cath site and cover with plastic wrap 1 hour prior to infusion appointments (Patient not taking: Reported on  07/25/2021) 30 g 3   LORazepam (ATIVAN) 0.5 MG tablet Take 0.5 mg by mouth daily as needed. (Patient not taking: Reported on 07/25/2021)     prochlorperazine (COMPAZINE) 10 MG tablet Take 1 tablet (10 mg total) by mouth every 6 (six) hours as needed (Nausea or vomiting). (Patient not taking: Reported on 07/25/2021) 30 tablet 1   No current facility-administered medications for this visit.   Facility-Administered Medications Ordered in Other Visits  Medication Dose Route Frequency Provider Last Rate Last Admin   heparin lock flush 100 unit/mL  500 Units Intravenous Once Derek Jack, MD       sodium chloride flush (NS) 0.9 % injection 10 mL  10 mL Intravenous PRN Derek Jack, MD         PHYSICAL EXAMINATION: Performance status (ECOG): 1 - Symptomatic but completely ambulatory  Vitals:   07/25/21 0905  BP: 120/78  Pulse: 96  Resp: 20  Temp: 98.8 F (37.1 C)  SpO2: 97%  Wt Readings from Last 3 Encounters:  07/25/21 176 lb 12.9 oz (80.2 kg)  07/17/21 181 lb 7 oz (82.3 kg)  07/11/21 182 lb 8.7 oz (82.8 kg)   Physical Exam Vitals reviewed.  Constitutional:      Appearance: Normal appearance.  Cardiovascular:     Rate and Rhythm: Normal rate and regular rhythm.     Pulses: Normal pulses.     Heart sounds: Normal heart sounds.  Pulmonary:     Effort: Pulmonary effort is normal.     Breath sounds: Normal breath sounds.  Musculoskeletal:     Right lower leg: No edema.     Left lower leg: No edema.  Neurological:     General: No focal deficit present.     Mental Status: She is alert and oriented to person, place, and time.  Psychiatric:        Mood and Affect: Mood normal.        Behavior: Behavior normal.    Breast Exam Chaperone: Thana Ates     LABORATORY DATA:  I have reviewed the data as listed CMP Latest Ref Rng & Units 07/17/2021 07/11/2021 07/03/2021  Glucose 70 - 99 mg/dL 130(H) 111(H) 95  BUN 8 - 23 mg/dL '8 17 16  ' Creatinine 0.44 - 1.00  mg/dL 0.59 0.79 0.68  Sodium 135 - 145 mmol/L 139 131(L) 134(L)  Potassium 3.5 - 5.1 mmol/L 2.9(L) 3.7 4.2  Chloride 98 - 111 mmol/L 105 99 106  CO2 22 - 32 mmol/L '26 23 24  ' Calcium 8.9 - 10.3 mg/dL 8.4(L) 8.7(L) 8.4(L)  Total Protein 6.5 - 8.1 g/dL 6.4(L) 7.4 6.7  Total Bilirubin 0.3 - 1.2 mg/dL 0.4 0.6 0.6  Alkaline Phos 38 - 126 U/L 72 73 64  AST 15 - 41 U/L '18 17 15  ' ALT 0 - 44 U/L '15 19 15   ' No results found for: [id number] Lab Results  Component Value Date   WBC 5.3 07/25/2021   HGB 10.0 (L) 07/25/2021   HCT 31.2 (L) 07/25/2021   MCV 107.6 (H) 07/25/2021   PLT 677 (H) 07/25/2021   NEUTROABS 2.6 07/25/2021    ASSESSMENT:  1.  T3N3c (stage IIIc) triple negative invasive lobular carcinoma of the left breast: - She felt lump in her left breast for more than 6 months, but thought it was secondary to fibrocystic disease which she had all her life.  When she started having pain, she reached out to Dr. Sherrie Sport. - She previously had mammogram machine malfunction and had severe pain and traumatized by that experience.  She was having ultrasound of the breast every other year since then. - She was on hormone replacement therapy for close to 10 years (from age [age]). - Ultrasound of the left breast on 01/08/2021 showed hyper vascular hypoechoic mass at 12 o'clock position measuring 3.8 x 1.3 x 2.5 cm.  There are calcifications located within the mass.  Mass extends to the level of the skin with mild associated skin thickening.  At least 3 morphologically abnormal lymph nodes in the left axilla. - Mammogram showed irregular spiculated mass with associated calcifications in the retroareolar to upper left breast measuring approximately 6 cm.  There are at least 4 morphologically abnormal lymph nodes identified in the left axilla. - Ultrasound-guided left breast and left axillary lymph node biopsy on 01/15/2021 - Pathology consistent with invasive lobular carcinoma, E-cadherin negative.  ER/PR/HER2  negative.  HER2 2+ by IHC, negative by FISH.  Ki-67 is 5%.  Lymph  node core biopsy was consistent with metastatic carcinoma.  Grade 2. - PET scan on 02/03/2021 showed involvement of left supraclavicular, subpectoral, axillary lymph nodes along with the breast mass.  10 mm left lung nodule which is hypometabolic.  Spinal cord lesion at T12 level with a strong uptake. - MRI of the lumbar spine with and without contrast on 02/20/2021 showed no mass or abnormal enhancement within the canal at the T12 level to correspond to the site of PET scan positive.  No marrow replacing bone lesion. - 2D echo on 02/21/2021 with EF 55-60%. - Weekly carboplatin and paclitaxel with every 3 weeks pembrolizumab ([id number]) started on 02/27/2021. - Cycle 1 of AC on 07/04/2021. - Hospitalization with neutropenic fever (adenovirus) from 07/11/2021 through 07/14/2021. - Cycle 2 chemotherapy with 50% dose reduction.   2.  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  She is non-smoker. - Mother died of breast cancer.  Maternal grandmother died very young in her 23s, sister has fibrocystic disease.  Father died of lung cancer and was a smoker.   PLAN:  1.  T3N3c triple negative invasive lobular carcinoma of the left breast: -She feels improvement in her cough and chest pain while coughing. - She was seen by Dr. Sherrie Sport who started her on tiotropium Respimat inhaler which is helping her breathing.  She has completed antibiotics. - Denies any nausea, vomiting, diarrhea or constipation.  No tingling or numbness reported. - Her energy levels are still low. - We reviewed her labs today which showed normal LFTs and creatinine.  CBC shows improvement in platelet count and hemoglobin. - Patient would not want to start chemotherapy next week as she is taking care of her grandchildren.  She does not want to do it during the week of Thanksgiving as her children are visiting her. - We will  plan for cycle 2 during the week of 08/11/2021. - We had a prolonged discussion about dose reduction.  Patient reports that she had terrible near death experience when she was sick and hospitalized.  Hence we will cut back on the dose to 50% of Adriamycin and cyclophosphamide. - We will see her back in 2 weeks after her cycle 2 to see how she is tolerating and check blood counts.  2.  Hypothyroidism: -Continue Synthroid 75 mcg daily.  3.  Hypokalemia: -Potassium today is 3.9.  No potassium supplements necessary.  4.  Left wrist fracture: -She sustained left wrist fracture after fall on 05/02/2021.  This has healed completely.  5.  Peripheral neuropathy: -Numbness in the toes on and off.  No pharmacological intervention.  6.  Hypomagnesemia: -Continue magnesium once daily.  Magnesium today is 1.8.  Breast Cancer therapy associated bone loss: I have recommended calcium, Vitamin D and weight bearing exercises.  Orders placed this encounter:  No orders of the defined types were placed in this encounter.   The patient has a good understanding of the overall plan. She agrees with it. She will call with any problems that may develop before the next visit here.  Derek Jack, MD [place] [phone]   I, Thana Ates, am acting as a scribe for Dr. Derek Jack.  I, Derek Jack MD, have reviewed the above documentation for accuracy and completeness, and I agree with the above.

## 2021-07-29 ENCOUNTER — Other Ambulatory Visit (HOSPITAL_COMMUNITY): Payer: Medicare Other

## 2021-07-29 ENCOUNTER — Ambulatory Visit (HOSPITAL_COMMUNITY): Payer: Medicare Other

## 2021-07-30 ENCOUNTER — Ambulatory Visit (HOSPITAL_COMMUNITY): Payer: Medicare Other

## 2021-08-12 ENCOUNTER — Telehealth (HOSPITAL_COMMUNITY): Payer: Self-pay

## 2021-08-12 NOTE — Telephone Encounter (Signed)
Returned call to patient and instructed patient Dr. Delton Coombes agrees she is okay to come in for treatment Thursday 08/14/21 due to antibiotics are prophylactic and patient asymptomatic.  Understanding verbalized.

## 2021-08-12 NOTE — Telephone Encounter (Signed)
Patient called the clinic to ask if her treatment on 08/14/2021 will be affected due to patient was placed on antibiotics as a prophylactic by Dr. Sherrie Sport due to her family was in for the holiday and they were diagnosed with strep throat.

## 2021-08-13 ENCOUNTER — Telehealth (HOSPITAL_COMMUNITY): Payer: Self-pay | Admitting: *Deleted

## 2021-08-13 NOTE — Telephone Encounter (Signed)
Patient called today to advise that she is not feeling well today and has a stiff neck, to the point where it is difficult for her to turn her head.  Is on prophylactic ab due to being exposed to family members over the holidays with strep.  Due to her newly reported symptoms, treatment has been postponed to next week and she has been advised to see her PCP.  Verbalized understanding and will call today to get appointment.

## 2021-08-14 ENCOUNTER — Ambulatory Visit (HOSPITAL_COMMUNITY): Payer: Medicare Other

## 2021-08-14 ENCOUNTER — Other Ambulatory Visit (HOSPITAL_COMMUNITY): Payer: Medicare Other

## 2021-08-15 ENCOUNTER — Ambulatory Visit (HOSPITAL_COMMUNITY): Payer: Medicare Other

## 2021-08-21 ENCOUNTER — Inpatient Hospital Stay (HOSPITAL_COMMUNITY): Payer: Medicare Other

## 2021-08-21 ENCOUNTER — Inpatient Hospital Stay (HOSPITAL_COMMUNITY): Payer: Medicare Other | Attending: Hematology

## 2021-08-21 ENCOUNTER — Other Ambulatory Visit: Payer: Self-pay

## 2021-08-21 VITALS — BP 132/78 | HR 71 | Temp 97.1°F | Resp 18

## 2021-08-21 DIAGNOSIS — Z803 Family history of malignant neoplasm of breast: Secondary | ICD-10-CM | POA: Insufficient documentation

## 2021-08-21 DIAGNOSIS — I10 Essential (primary) hypertension: Secondary | ICD-10-CM | POA: Diagnosis not present

## 2021-08-21 DIAGNOSIS — J45909 Unspecified asthma, uncomplicated: Secondary | ICD-10-CM | POA: Diagnosis not present

## 2021-08-21 DIAGNOSIS — Z79899 Other long term (current) drug therapy: Secondary | ICD-10-CM | POA: Diagnosis not present

## 2021-08-21 DIAGNOSIS — Z5111 Encounter for antineoplastic chemotherapy: Secondary | ICD-10-CM | POA: Insufficient documentation

## 2021-08-21 DIAGNOSIS — Z5189 Encounter for other specified aftercare: Secondary | ICD-10-CM | POA: Diagnosis not present

## 2021-08-21 DIAGNOSIS — G629 Polyneuropathy, unspecified: Secondary | ICD-10-CM | POA: Diagnosis not present

## 2021-08-21 DIAGNOSIS — Z801 Family history of malignant neoplasm of trachea, bronchus and lung: Secondary | ICD-10-CM | POA: Diagnosis not present

## 2021-08-21 DIAGNOSIS — C50811 Malignant neoplasm of overlapping sites of right female breast: Secondary | ICD-10-CM | POA: Insufficient documentation

## 2021-08-21 DIAGNOSIS — Z95828 Presence of other vascular implants and grafts: Secondary | ICD-10-CM

## 2021-08-21 DIAGNOSIS — E039 Hypothyroidism, unspecified: Secondary | ICD-10-CM | POA: Diagnosis not present

## 2021-08-21 DIAGNOSIS — E876 Hypokalemia: Secondary | ICD-10-CM | POA: Diagnosis not present

## 2021-08-21 DIAGNOSIS — C50912 Malignant neoplasm of unspecified site of left female breast: Secondary | ICD-10-CM

## 2021-08-21 DIAGNOSIS — Z5112 Encounter for antineoplastic immunotherapy: Secondary | ICD-10-CM | POA: Insufficient documentation

## 2021-08-21 LAB — TSH: TSH: 3.38 u[IU]/mL (ref 0.350–4.500)

## 2021-08-21 LAB — CBC WITH DIFFERENTIAL/PLATELET
Abs Immature Granulocytes: 0.03 10*3/uL (ref 0.00–0.07)
Basophils Absolute: 0 10*3/uL (ref 0.0–0.1)
Basophils Relative: 0 %
Eosinophils Absolute: 0.3 10*3/uL (ref 0.0–0.5)
Eosinophils Relative: 5 %
HCT: 34.8 % — ABNORMAL LOW (ref 36.0–46.0)
Hemoglobin: 11.3 g/dL — ABNORMAL LOW (ref 12.0–15.0)
Immature Granulocytes: 0 %
Lymphocytes Relative: 19 %
Lymphs Abs: 1.2 10*3/uL (ref 0.7–4.0)
MCH: 32.7 pg (ref 26.0–34.0)
MCHC: 32.5 g/dL (ref 30.0–36.0)
MCV: 100.6 fL — ABNORMAL HIGH (ref 80.0–100.0)
Monocytes Absolute: 0.7 10*3/uL (ref 0.1–1.0)
Monocytes Relative: 11 %
Neutro Abs: 4.4 10*3/uL (ref 1.7–7.7)
Neutrophils Relative %: 65 %
Platelets: 225 10*3/uL (ref 150–400)
RBC: 3.46 MIL/uL — ABNORMAL LOW (ref 3.87–5.11)
RDW: 13 % (ref 11.5–15.5)
WBC: 6.7 10*3/uL (ref 4.0–10.5)
nRBC: 0 % (ref 0.0–0.2)

## 2021-08-21 LAB — COMPREHENSIVE METABOLIC PANEL
ALT: 25 U/L (ref 0–44)
AST: 24 U/L (ref 15–41)
Albumin: 3.5 g/dL (ref 3.5–5.0)
Alkaline Phosphatase: 87 U/L (ref 38–126)
Anion gap: 7 (ref 5–15)
BUN: 17 mg/dL (ref 8–23)
CO2: 28 mmol/L (ref 22–32)
Calcium: 9.2 mg/dL (ref 8.9–10.3)
Chloride: 101 mmol/L (ref 98–111)
Creatinine, Ser: 0.78 mg/dL (ref 0.44–1.00)
GFR, Estimated: >60 mL/min (ref >60)
Glucose, Bld: 119 mg/dL — ABNORMAL HIGH (ref 70–99)
Potassium: 3.8 mmol/L (ref 3.5–5.1)
Sodium: 136 mmol/L (ref 135–145)
Total Bilirubin: 0.4 mg/dL (ref 0.3–1.2)
Total Protein: 7.6 g/dL (ref 6.5–8.1)

## 2021-08-21 LAB — MAGNESIUM: Magnesium: 1.7 mg/dL (ref 1.7–2.4)

## 2021-08-21 MED ORDER — SODIUM CHLORIDE 0.9 % IV SOLN
10.0000 mg | Freq: Once | INTRAVENOUS | Status: AC
  Administered 2021-08-21: 10 mg via INTRAVENOUS
  Filled 2021-08-21: qty 10

## 2021-08-21 MED ORDER — SODIUM CHLORIDE 0.9 % IV SOLN: Freq: Once | INTRAVENOUS | Status: AC

## 2021-08-21 MED ORDER — SODIUM CHLORIDE 0.9 % IV SOLN
200.0000 mg | Freq: Once | INTRAVENOUS | Status: AC
  Administered 2021-08-21: 200 mg via INTRAVENOUS
  Filled 2021-08-21: qty 8

## 2021-08-21 MED ORDER — HEPARIN SOD (PORK) LOCK FLUSH 100 UNIT/ML IV SOLN
500.0000 [IU] | Freq: Once | INTRAVENOUS | Status: AC | PRN
  Administered 2021-08-21: 500 [IU]

## 2021-08-21 MED ORDER — DOXORUBICIN HCL CHEMO IV INJECTION 2 MG/ML
30.0000 mg/m2 | Freq: Once | INTRAVENOUS | Status: AC
  Administered 2021-08-21: 58 mg via INTRAVENOUS
  Filled 2021-08-21: qty 29

## 2021-08-21 MED ORDER — PALONOSETRON HCL INJECTION 0.25 MG/5ML
0.2500 mg | Freq: Once | INTRAVENOUS | Status: AC
  Administered 2021-08-21: 0.25 mg via INTRAVENOUS
  Filled 2021-08-21: qty 5

## 2021-08-21 MED ORDER — SODIUM CHLORIDE 0.9 % IV SOLN
300.0000 mg/m2 | Freq: Once | INTRAVENOUS | Status: AC
  Administered 2021-08-21: 580 mg via INTRAVENOUS
  Filled 2021-08-21: qty 29

## 2021-08-21 MED ORDER — SODIUM CHLORIDE 0.9 % IV SOLN
150.0000 mg | Freq: Once | INTRAVENOUS | Status: AC
  Administered 2021-08-21: 150 mg via INTRAVENOUS
  Filled 2021-08-21: qty 150

## 2021-08-21 MED ORDER — SODIUM CHLORIDE 0.9% FLUSH
10.0000 mL | INTRAVENOUS | Status: DC | PRN
  Administered 2021-08-21: 10 mL

## 2021-08-21 NOTE — Patient Instructions (Signed)
[facility]  Discharge Instructions: Thank you for choosing [facility] to provide your oncology and hematology care.  If you have a lab appointment with the Cancer Center, please come in thru the Main Entrance and check in at the main information desk.  Wear comfortable clothing and clothing appropriate for easy access to any Portacath or PICC line.   We strive to give you quality time with your provider. You may need to reschedule your appointment if you arrive late (15 or more minutes).  Arriving late affects you and other patients whose appointments are after yours.  Also, if you miss three or more appointments without notifying the office, you may be dismissed from the clinic at the provider's discretion.      For prescription refill requests, have your pharmacy contact our office and allow 72 hours for refills to be completed.        To help prevent nausea and vomiting after your treatment, we encourage you to take your nausea medication as directed.  BELOW ARE SYMPTOMS THAT SHOULD BE REPORTED IMMEDIATELY: *FEVER GREATER THAN 100.4 F (38 C) OR HIGHER *CHILLS OR SWEATING *NAUSEA AND VOMITING THAT IS NOT CONTROLLED WITH YOUR NAUSEA MEDICATION *UNUSUAL SHORTNESS OF BREATH *UNUSUAL BRUISING OR BLEEDING *URINARY PROBLEMS (pain or burning when urinating, or frequent urination) *BOWEL PROBLEMS (unusual diarrhea, constipation, pain near the anus) TENDERNESS IN MOUTH AND THROAT WITH OR WITHOUT PRESENCE OF ULCERS (sore throat, sores in mouth, or a toothache) UNUSUAL RASH, SWELLING OR PAIN  UNUSUAL VAGINAL DISCHARGE OR ITCHING   Items with * indicate a potential emergency and should be followed up as soon as possible or go to the Emergency Department if any problems should occur.  Please show the CHEMOTHERAPY ALERT CARD or IMMUNOTHERAPY ALERT CARD at check-in to the Emergency Department and triage nurse.  Should you have questions after your visit or need to cancel  or reschedule your appointment, please contact [facility] [phone]  and follow the prompts.  Office hours are 8:00 a.m. to 4:30 p.m. Monday - Friday. Please note that voicemails left after 4:00 p.m. may not be returned until the following business day.  We are closed weekends and major holidays. You have access to a nurse at all times for urgent questions. Please call the main number to the clinic [phone] and follow the prompts.  For any non-urgent questions, you may also contact your provider using MyChart. We now offer e-Visits for anyone 18 and older to request care online for non-urgent symptoms. For details visit [email or web address].   Also download the MyChart app! Go to the app store, search "MyChart", open the app, select [facility], and log in with your MyChart username and password.  Due to Covid, a mask is required upon entering the hospital/clinic. If you do not have a mask, one will be given to you upon arrival. For doctor visits, patients may have 1 support person aged 18 or older with them. For treatment visits, patients cannot have anyone with them due to current Covid guidelines and our immunocompromised population.  

## 2021-08-21 NOTE — Progress Notes (Unsigned)
Patients port flushed without difficulty.  Good blood return noted with no bruising or swelling noted at site.  Stable during access and blood draw.  Patient to remain accessed for treatment. 

## 2021-08-21 NOTE — Progress Notes (Signed)
Patient presents today for chemotherapy treatment.  Patient is in satisfactory condition with no complaints voiced.  Vital signs are stable.  Labs reviewed and all are within treatment parameters.  We will proceed with treatment per MD orders.  Patient tolerated treatment well with no complaints voiced.  Patient left ambulatory in stable condition.  Vital signs stable at discharge.  Follow up as scheduled.

## 2021-08-22 ENCOUNTER — Inpatient Hospital Stay (HOSPITAL_COMMUNITY): Payer: Medicare Other

## 2021-08-22 VITALS — BP 145/67 | HR 71 | Temp 98.8°F | Resp 18

## 2021-08-22 DIAGNOSIS — E039 Hypothyroidism, unspecified: Secondary | ICD-10-CM | POA: Diagnosis not present

## 2021-08-22 DIAGNOSIS — Z5189 Encounter for other specified aftercare: Secondary | ICD-10-CM | POA: Diagnosis not present

## 2021-08-22 DIAGNOSIS — C50912 Malignant neoplasm of unspecified site of left female breast: Secondary | ICD-10-CM

## 2021-08-22 DIAGNOSIS — Z5112 Encounter for antineoplastic immunotherapy: Secondary | ICD-10-CM | POA: Diagnosis not present

## 2021-08-22 DIAGNOSIS — Z79899 Other long term (current) drug therapy: Secondary | ICD-10-CM | POA: Diagnosis not present

## 2021-08-22 DIAGNOSIS — Z95828 Presence of other vascular implants and grafts: Secondary | ICD-10-CM

## 2021-08-22 DIAGNOSIS — Z5111 Encounter for antineoplastic chemotherapy: Secondary | ICD-10-CM | POA: Diagnosis not present

## 2021-08-22 DIAGNOSIS — C50811 Malignant neoplasm of overlapping sites of right female breast: Secondary | ICD-10-CM | POA: Diagnosis not present

## 2021-08-22 MED ORDER — PEGFILGRASTIM-JMDB 6 MG/0.6ML ~~LOC~~ SOSY
6.0000 mg | PREFILLED_SYRINGE | Freq: Once | SUBCUTANEOUS | Status: AC
  Administered 2021-08-22: 6 mg via SUBCUTANEOUS
  Filled 2021-08-22: qty 0.6

## 2021-08-22 NOTE — Progress Notes (Signed)
Fulphila injection given per orders. Patient tolerated it well without problems. Vitals stable and discharged home from clinic ambulatory. Follow up as scheduled.  

## 2021-08-22 NOTE — Patient Instructions (Signed)
[facility]  Discharge Instructions: Thank you for choosing [facility] to provide your oncology and hematology care.  If you have a lab appointment with the Cancer Center, please come in thru the Main Entrance and check in at the main information desk.  Wear comfortable clothing and clothing appropriate for easy access to any Portacath or PICC line.   We strive to give you quality time with your provider. You may need to reschedule your appointment if you arrive late (15 or more minutes).  Arriving late affects you and other patients whose appointments are after yours.  Also, if you miss three or more appointments without notifying the office, you may be dismissed from the clinic at the provider's discretion.      For prescription refill requests, have your pharmacy contact our office and allow 72 hours for refills to be completed.        To help prevent nausea and vomiting after your treatment, we encourage you to take your nausea medication as directed.  BELOW ARE SYMPTOMS THAT SHOULD BE REPORTED IMMEDIATELY: *FEVER GREATER THAN 100.4 F (38 C) OR HIGHER *CHILLS OR SWEATING *NAUSEA AND VOMITING THAT IS NOT CONTROLLED WITH YOUR NAUSEA MEDICATION *UNUSUAL SHORTNESS OF BREATH *UNUSUAL BRUISING OR BLEEDING *URINARY PROBLEMS (pain or burning when urinating, or frequent urination) *BOWEL PROBLEMS (unusual diarrhea, constipation, pain near the anus) TENDERNESS IN MOUTH AND THROAT WITH OR WITHOUT PRESENCE OF ULCERS (sore throat, sores in mouth, or a toothache) UNUSUAL RASH, SWELLING OR PAIN  UNUSUAL VAGINAL DISCHARGE OR ITCHING   Items with * indicate a potential emergency and should be followed up as soon as possible or go to the Emergency Department if any problems should occur.  Please show the CHEMOTHERAPY ALERT CARD or IMMUNOTHERAPY ALERT CARD at check-in to the Emergency Department and triage nurse.  Should you have questions after your visit or need to cancel  or reschedule your appointment, please contact [facility] [phone]  and follow the prompts.  Office hours are 8:00 a.m. to 4:30 p.m. Monday - Friday. Please note that voicemails left after 4:00 p.m. may not be returned until the following business day.  We are closed weekends and major holidays. You have access to a nurse at all times for urgent questions. Please call the main number to the clinic [phone] and follow the prompts.  For any non-urgent questions, you may also contact your provider using MyChart. We now offer e-Visits for anyone 18 and older to request care online for non-urgent symptoms. For details visit [email or web address].   Also download the MyChart app! Go to the app store, search "MyChart", open the app, select [facility], and log in with your MyChart username and password.  Due to Covid, a mask is required upon entering the hospital/clinic. If you do not have a mask, one will be given to you upon arrival. For doctor visits, patients may have 1 support person aged 18 or older with them. For treatment visits, patients cannot have anyone with them due to current Covid guidelines and our immunocompromised population.  

## 2021-08-25 ENCOUNTER — Encounter (HOSPITAL_COMMUNITY): Payer: Self-pay | Admitting: *Deleted

## 2021-08-25 NOTE — Progress Notes (Signed)
Patient called to advise that on her way home after treatment on Friday, she developed multiple symptoms that she had not experienced with any other treatments.  Her symptoms included; perfuse perspiration,chest heaviness, sob, lower back pain, jaw and tooth pain and severe fatigue.  She noted that some symptoms lasted for 48 hours, however weakness is all that she voiced that has continued to today.  Advised her that she should not ignore signs like this in the future and go to the emergency room for evaluation.  Verbalized understanding.  Will follow up with Dr Delton Coombes on 12/28 and review events with him prior to receiving next treatment.  Made her aware to call us with any further issues, in the meantime.

## 2021-08-31 ENCOUNTER — Other Ambulatory Visit: Payer: Self-pay

## 2021-09-04 ENCOUNTER — Ambulatory Visit (HOSPITAL_COMMUNITY): Payer: Medicare Other | Admitting: Hematology

## 2021-09-04 ENCOUNTER — Other Ambulatory Visit (HOSPITAL_COMMUNITY): Payer: Medicare Other

## 2021-09-04 ENCOUNTER — Ambulatory Visit (HOSPITAL_COMMUNITY): Payer: Medicare Other

## 2021-09-05 ENCOUNTER — Ambulatory Visit (HOSPITAL_COMMUNITY): Payer: Medicare Other

## 2021-09-09 NOTE — Progress Notes (Signed)
[facility] [address], [place] [address]   CLINIC:  Medical Oncology/Hematology  PCP:  Toma Deiters, MD [address] / [place] [address] [phone]   REASON FOR VISIT:  Follow-up for  left breast cancer  PRIOR THERAPY: none  NGS Results: not done  CURRENT THERAPY: Weekly carboplatin and paclitaxel with every 3 weeks pembrolizumab (keynote-522) started on 02/27/2021  BRIEF ONCOLOGIC HISTORY:  Oncology History  Invasive lobular carcinoma of left breast in female [facility])  01/23/2021 Initial Diagnosis   Invasive lobular carcinoma of left breast in female [facility])   02/19/2021 Genetic Testing   Negative genetic testing on the CancerNext-Expanded+RNAinsight panel.  SMARCB1 VUS identified.  The CancerNext-Expanded gene panel offered by [facility] and includes sequencing and rearrangement analysis for the following 77 genes: AIP, ALK, APC*, ATM*, AXIN2, BAP1, BARD1, BLM, BMPR1A, BRCA1*, BRCA2*, BRIP1*, CDC73, CDH1*, CDK4, CDKN1B, CDKN2A, CHEK2*, CTNNA1, DICER1, FANCC, FH, FLCN, GALNT12, KIF1B, LZTR1, MAX, MEN1, MET, MLH1*, MSH2*, MSH3, MSH6*, MUTYH*, NBN, NF1*, NF2, NTHL1, PALB2*, PHOX2B, PMS2*, POT1, PRKAR1A, PTCH1, PTEN*, RAD51C*, RAD51D*, RB1, RECQL, RET, SDHA, SDHAF2, SDHB, SDHC, SDHD, SMAD4, SMARCA4, SMARCB1, SMARCE1, STK11, SUFU, TMEM127, TP53*, TSC1, TSC2, VHL and XRCC2 (sequencing and deletion/duplication); EGFR, EGLN1, HOXB13, KIT, MITF, PDGFRA, POLD1, and POLE (sequencing only); EPCAM and GREM1 (deletion/duplication only). DNA and RNA analyses performed for * genes. The report date is February 19, 2021.   02/27/2021 -  Chemotherapy   Patient is on Treatment Plan : BREAST Pembrolizumab + Carboplatin D1,8,15+ Paclitaxel D1,8,15 q21d X 4 cycles / Pembrolizumab + AC q21d x 4 cycles       CANCER STAGING:  Cancer Staging  Invasive lobular carcinoma of left breast in female [facility]) Staging form: Breast, AJCC 8th Edition - Clinical stage from 01/23/2021: cT3,  cN3c, G2, ER-, PR-, HER2- - Unsigned   INTERVAL HISTORY:  [name], a 70 y.o. female, returns for routine follow-up and consideration for next cycle of chemotherapy. Denise [name] was last seen on 07/25/2021.  Due for cycle #7 of Keytruda + AC today.   Overall, she tells me she has been feeling pretty well. She reports hot flashes with severe sweating, back pain, SOB, chest tightness, and palpitations starting 15 minutes after her previous treatment and lasting for 2-4 hours. She reports she still has SOB with exertion while the other symptoms have resolved. She did not take Claritin. She reports neck stiffness and SOB occasionally at night which disrupts her sleep. She reports stable numbness in her toes.   Overall, she feels ready for next cycle of chemo today.   REVIEW OF SYSTEMS:  Review of Systems  Constitutional:  Negative for appetite change and fatigue (75%).  Respiratory:  Positive for cough and shortness of breath. Chest tightness: resolved.  Cardiovascular:  Palpitations: resolved.  Endocrine: Hot flashes: resolved.  Musculoskeletal:  Positive for neck pain (stiff). Back pain: resolved. Skin:  Negative for rash.  Neurological:  Positive for numbness (toes; stable).  Psychiatric/Behavioral:  Positive for sleep disturbance.   All other systems reviewed and are negative.  PAST MEDICAL/SURGICAL HISTORY:  Past Medical History:  Diagnosis Date   Asthma    as child   Cancer (HCC) 12/2020   left breast IMC   Complication of anesthesia    pateitn states,' I coded when I had my D&Cmany years ago.   Family history of breast cancer    Hypertension    Hypothyroidism    PONV (postoperative nausea and vomiting)  Port-A-Cath in place 02/26/2021   Past Surgical History:  Procedure Laterality Date   ABDOMINAL HYSTERECTOMY     BIOPSY  01/17/2018   Procedure: BIOPSY;  Surgeon: Malissa Hippo, MD;  [facility];  Service: Endoscopy;;  duodenum,gastric    CHOLECYSTECTOMY     COLONOSCOPY WITH PROPOFOL N/A 01/17/2018   Procedure: COLONOSCOPY WITH PROPOFOL;  Surgeon: Malissa Hippo, MD;  [facility];  Service: Endoscopy;  Laterality: N/A;  7:30   DILATION AND CURETTAGE OF UTERUS     ESOPHAGOGASTRODUODENOSCOPY (EGD) WITH PROPOFOL N/A 01/17/2018   Procedure: ESOPHAGOGASTRODUODENOSCOPY (EGD) WITH PROPOFOL;  Surgeon: Malissa Hippo, MD;  [facility];  Service: Endoscopy;  Laterality: N/A;   POLYPECTOMY  01/17/2018   Procedure: POLYPECTOMY;  Surgeon: Malissa Hippo, MD;  [facility];  Service: Endoscopy;;  transverse colon, cecal   PORTACATH PLACEMENT Right 02/17/2021   Procedure: INSERTION PORT-A-CATH;  Surgeon: Abigail Miyamoto, MD;  Location: [facility];  Service: General;  Laterality: Right;    SOCIAL HISTORY:  Social History   Socioeconomic History   Marital status: Widowed    Spouse name: Not on file   Number of children: 3   Years of education: Not on file   Highest education level: Not on file  Occupational History   Occupation: [employer]   Occupation: retired  Tobacco Use   Smoking status: Never   Smokeless tobacco: Never  Building services engineer Use: Never used  Substance and Sexual Activity   Alcohol use: Never   Drug use: Never   Sexual activity: Not Currently    Birth control/protection: Surgical  Other Topics Concern   Not on file  Social History Narrative   Not on file   Social Determinants of Health   Financial Resource Strain: Low Risk    Difficulty of Paying Living Expenses: Not hard at all  Food Insecurity: No Food Insecurity   Worried About Programme researcher, broadcasting/film/video in the Last Year: Never true   Ran Out of Food in the Last Year: Never true  Transportation Needs: No Transportation Needs   Lack of Transportation (Medical): No   Lack of Transportation (Non-Medical): No  Physical Activity: Insufficiently Active   Days of Exercise per Week: 3 days    Minutes of Exercise per Session: 30 min  Stress: No Stress Concern Present   Feeling of Stress : Not at all  Social Connections: Moderately Integrated   Frequency of Communication with Friends and Family: More than three times a week   Frequency of Social Gatherings with Friends and Family: More than three times a week   Attends Religious Services: More than 4 times per year   Active Member of Clubs or Organizations: No   Attends Engineer, structural: More than 4 times per year   Marital Status: Widowed  Catering manager Violence: Not At Risk   Fear of Current or Ex-Partner: No   Emotionally Abused: No   Physically Abused: No   Sexually Abused: No    FAMILY HISTORY:  Family History  Problem Relation Age of Onset   Breast cancer Mother        dx in her 33s   Heart disease Mother    Thyroid disease Mother    Lung cancer Father        dx in his 23s   Thyroid disease Maternal Aunt    Thyroid nodules Maternal Grandmother 29  goiter   Thyroid disease Maternal Grandmother    Heart disease Maternal Grandfather    Heart disease Paternal Grandmother    Heart disease Paternal Grandfather    Thyroid disease Daughter    Thyroid disease Daughter    Cancer Maternal Uncle        NOS   Cancer Paternal Uncle        NOS   Breast cancer Cousin        pat first cousin died in her 29s;     CURRENT MEDICATIONS:  Current Outpatient Medications  Medication Sig Dispense Refill   Acetaminophen (TYLENOL PO) Take 1 tablet by mouth as needed.     albuterol (VENTOLIN HFA) 108 (90 Base) MCG/ACT inhaler Inhale into the lungs.     amoxicillin (AMOXIL) 875 MG tablet Take 875 mg by mouth 2 (two) times daily.     amoxicillin-clavulanate (AUGMENTIN) 875-125 MG tablet Take 1 tablet by mouth 2 (two) times daily.     azelastine (ASTELIN) 0.1 % nasal spray Place into both nostrils.     benazepril (LOTENSIN) 20 MG tablet Take 20 mg by mouth daily.     benzonatate (TESSALON) 100 MG capsule Take  1 capsule (100 mg total) by mouth 3 (three) times daily as needed for cough. 60 capsule 0   CARBOPLATIN IV Inject into the vein once a week.     doxepin (SINEQUAN) 10 MG capsule Take 10 mg by mouth at bedtime.     famotidine (PEPCID) 20 MG tablet Take 1 tablet (20 mg total) by mouth daily. 30 tablet 0   filgrastim (NEUPOGEN) 480 MCG/0.8ML SOSY injection Inject 0.8 mLs (480 mcg total) into the skin daily as needed for up to 8 doses (self-administer subcutaneously as directed by Dr. Delton Coombes as needed for neutropenia). 6.4 mL 0   gabapentin (NEURONTIN) 300 MG capsule Take 1 capsule by mouth at bedtime.     hydrochlorothiazide (MICROZIDE) 12.5 MG capsule Take 12.5 mg by mouth daily.     HYDROcodone-acetaminophen (NORCO) 5-325 MG tablet Take 1 tablet by mouth every 12 (twelve) hours as needed for moderate pain. 20 tablet 0   levothyroxine (SYNTHROID) 75 MCG tablet Take 100 mcg by mouth daily before breakfast.     lidocaine-prilocaine (EMLA) cream Apply a small amount to port a cath site and cover with plastic wrap 1 hour prior to infusion appointments 30 g 3   LORazepam (ATIVAN) 0.5 MG tablet Take 0.5 mg by mouth daily as needed.     losartan (COZAAR) 50 MG tablet Take 50 mg by mouth daily.     magnesium oxide (MAG-OX) 400 (240 Mg) MG tablet Take 1 tablet (400 mg total) by mouth daily. 60 tablet 2   magnesium oxide (MAG-OX) 400 MG tablet Take 1 tablet by mouth daily.     Melatonin 5 MG TABS Take 5 mg by mouth. As needed for sleep     metoprolol succinate (TOPROL-XL) 25 MG 24 hr tablet Take 25 mg by mouth daily.     nystatin (MYCOSTATIN) 100000 UNIT/ML suspension Take by mouth.     Omega-3 Fatty Acids (FISH OIL PO) Take by mouth.     ondansetron (ZOFRAN) 8 MG tablet Take by mouth.     PACLITAXEL IV Inject into the vein once a week.     Pembrolizumab (KEYTRUDA IV) Inject into the vein every 21 ( twenty-one) days.     prochlorperazine (COMPAZINE) 10 MG tablet Take 1 tablet (10 mg total) by mouth  every 6 (six)  hours as needed (Nausea or vomiting). 30 tablet 1   psyllium (METAMUCIL) 58.6 % packet Take by mouth.     rosuvastatin (CRESTOR) 5 MG tablet Take 5 mg by mouth daily.     Tiotropium Bromide-Olodaterol (STIOLTO RESPIMAT) 2.5-2.5 MCG/ACT AERS Inhale 1-2 puffs into the lungs as directed.     triamcinolone cream (KENALOG) 0.1 % Apply 1 application topically See admin instructions. Two to three times daily     No current facility-administered medications for this visit.    ALLERGIES:  Allergies  Allergen Reactions   Exforge [Amlodipine Besylate-Valsartan] Nausea And Vomiting   Cheese    Levofloxacin In D5w Hives   Strawberry Extract Diarrhea    Seeds,nuts, lettuce, grapes   Tramadol Hcl Nausea And Vomiting   Yeast-Related Products Hives    bread    PHYSICAL EXAM:  Performance status (ECOG): 1 - Symptomatic but completely ambulatory  Vitals:   09/10/21 0801  BP: 125/76  Pulse: 87  Resp: 18  Temp: (!) 96.6 F (35.9 C)  SpO2: 99%   Wt Readings from Last 3 Encounters:  09/10/21 179 lb 4.8 oz (81.3 kg)  08/21/21 178 lb 9.6 oz (81 kg)  07/25/21 176 lb 12.9 oz (80.2 kg)   Physical Exam Vitals reviewed.  Constitutional:      Appearance: Normal appearance.  Cardiovascular:     Rate and Rhythm: Normal rate and regular rhythm.     Pulses: Normal pulses.     Heart sounds: Normal heart sounds.  Pulmonary:     Effort: Pulmonary effort is normal.     Breath sounds: Normal breath sounds.  Neurological:     General: No focal deficit present.     Mental Status: She is alert and oriented to person, place, and time.  Psychiatric:        Mood and Affect: Mood normal.        Behavior: Behavior normal.    LABORATORY DATA:  I have reviewed the labs as listed.  CBC Latest Ref Rng & Units 09/10/2021 08/21/2021 07/25/2021  WBC 4.0 - 10.5 K/uL 3.1(L) 6.7 5.3  Hemoglobin 12.0 - 15.0 g/dL 10.9(L) 11.3(L) 10.0(L)  Hematocrit 36.0 - 46.0 % 33.2(L) 34.8(L) 31.2(L)  Platelets  150 - 400 K/uL 280 225 677(H)   CMP Latest Ref Rng & Units 08/21/2021 07/25/2021 07/17/2021  Glucose 70 - 99 mg/dL 119(H) 117(H) 130(H)  BUN 8 - 23 mg/dL $Remove'17 18 8  'qMNELlD$ Creatinine 0.44 - 1.00 mg/dL 0.78 0.70 0.59  Sodium 135 - 145 mmol/L 136 139 139  Potassium 3.5 - 5.1 mmol/L 3.8 3.9 2.9(L)  Chloride 98 - 111 mmol/L 101 105 105  CO2 22 - 32 mmol/L $RemoveB'28 26 26  'VUsojWgt$ Calcium 8.9 - 10.3 mg/dL 9.2 8.7(L) 8.4(L)  Total Protein 6.5 - 8.1 g/dL 7.6 7.0 6.4(L)  Total Bilirubin 0.3 - 1.2 mg/dL 0.4 0.6 0.4  Alkaline Phos 38 - 126 U/L 87 85 72  AST 15 - 41 U/L $Remo'24 22 18  'fnHAq$ ALT 0 - 44 U/L $Remo'25 22 15    'XbsmM$ DIAGNOSTIC IMAGING:  I have independently reviewed the scans and discussed with the patient. No results found.   ASSESSMENT:  1.  T3N3c (stage IIIc) triple negative invasive lobular carcinoma of the left breast: - She felt lump in her left breast for more than 6 months, but thought it was secondary to fibrocystic disease which she had all her life.  When she started having pain, she reached out to Dr. Sherrie Sport. - She previously had  mammogram machine malfunction and had severe pain and traumatized by that experience.  She was having ultrasound of the breast every other year since then. - She was on hormone replacement therapy for close to 10 years (from age [age]). - Ultrasound of the left breast on 01/08/2021 showed hyper vascular hypoechoic mass at 12 o'clock position measuring 3.8 x 1.3 x 2.5 cm.  There are calcifications located within the mass.  Mass extends to the level of the skin with mild associated skin thickening.  At least 3 morphologically abnormal lymph nodes in the left axilla. - Mammogram showed irregular spiculated mass with associated calcifications in the retroareolar to upper left breast measuring approximately 6 cm.  There are at least 4 morphologically abnormal lymph nodes identified in the left axilla. - Ultrasound-guided left breast and left axillary lymph node biopsy on 01/15/2021 - Pathology  consistent with invasive lobular carcinoma, E-cadherin negative.  ER/PR/HER2 negative.  HER2 2+ by IHC, negative by FISH.  Ki-67 is 5%.  Lymph node core biopsy was consistent with metastatic carcinoma.  Grade 2. - PET scan on 02/03/2021 showed involvement of left supraclavicular, subpectoral, axillary lymph nodes along with the breast mass.  10 mm left lung nodule which is hypometabolic.  Spinal cord lesion at T12 level with a strong uptake. - MRI of the lumbar spine with and without contrast on 02/20/2021 showed no mass or abnormal enhancement within the canal at the T12 level to correspond to the site of PET scan positive.  No marrow replacing bone lesion. - 2D echo on 02/21/2021 with EF 55-60%. - Weekly carboplatin and paclitaxel with every 3 weeks pembrolizumab (keynote-522) started on 02/27/2021. - Cycle 1 of AC on 07/04/2021.  Admission to [facility] with neutropenic fever. - Cycle 2 AC with 50% dose reduction on 08/21/2021.   2.  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  She is non-smoker. - Mother died of breast cancer.  Maternal grandmother died very young in her 48s, sister has fibrocystic disease.  Father died of lung cancer and was a smoker.   PLAN:  1.  T3N3c triple negative invasive lobular carcinoma of the left breast: - She has tolerated cycle 2 chemotherapy with 50% dose reduction very well. - She had fulphila injection next day.  Within 15 to 20 minutes, she developed sweating, low back pain, palpitations and chest tightness which lasted about 2 to 4 hours.  She also had dyspnea on exertion which lasted all 3 weeks. - Reviewed labs today which showed white count 3.1 with ANC of 1100.  However monocytes are 22%.  CMP is within normal limits. - Recommend continuing with C3 at the same dose. - Recommend starting Claritin to decrease bone pains. - She was given metoprolol XL 25 mg by Dr. Olena Leatherwood for palpitations associated with  G-CSF.  She was told to take Toprol-XL 1 tablet tomorrow in place of losartan and resume losartan already. - RTC 3 weeks for follow-up.  2.  Hypothyroidism: - Continue Synthroid 75 mcg daily.  TSH today is 2.8.  3.  Hypokalemia: - Potassium today is 3.7.  4.  Peripheral neuropathy: - Numbness in the toes on and off is stable.  5.  Hypomagnesemia: - Continue magnesium twice daily at home.  Magnesium today is normal.   Orders placed this encounter:  No orders of the defined types were placed in this encounter.    [name], MD [facility] [phone]  I, Thana Ates, am acting as a Education administrator for Dr. Derek Jack.  I, Derek Jack MD, have reviewed the above documentation for accuracy and completeness, and I agree with the above.

## 2021-09-10 ENCOUNTER — Inpatient Hospital Stay (HOSPITAL_COMMUNITY): Payer: Medicare Other

## 2021-09-10 ENCOUNTER — Other Ambulatory Visit: Payer: Self-pay

## 2021-09-10 ENCOUNTER — Encounter (HOSPITAL_COMMUNITY): Payer: Self-pay | Admitting: Hematology

## 2021-09-10 ENCOUNTER — Inpatient Hospital Stay (HOSPITAL_BASED_OUTPATIENT_CLINIC_OR_DEPARTMENT_OTHER): Payer: Medicare Other | Admitting: Hematology

## 2021-09-10 VITALS — BP 123/78 | HR 68 | Temp 96.7°F | Resp 18

## 2021-09-10 VITALS — BP 125/76 | HR 87 | Temp 96.6°F | Resp 18 | Ht 65.0 in | Wt 179.3 lb

## 2021-09-10 DIAGNOSIS — Z79899 Other long term (current) drug therapy: Secondary | ICD-10-CM | POA: Diagnosis not present

## 2021-09-10 DIAGNOSIS — C50912 Malignant neoplasm of unspecified site of left female breast: Secondary | ICD-10-CM | POA: Diagnosis not present

## 2021-09-10 DIAGNOSIS — E039 Hypothyroidism, unspecified: Secondary | ICD-10-CM | POA: Diagnosis not present

## 2021-09-10 DIAGNOSIS — Z5189 Encounter for other specified aftercare: Secondary | ICD-10-CM | POA: Diagnosis not present

## 2021-09-10 DIAGNOSIS — Z5111 Encounter for antineoplastic chemotherapy: Secondary | ICD-10-CM | POA: Diagnosis not present

## 2021-09-10 DIAGNOSIS — C50811 Malignant neoplasm of overlapping sites of right female breast: Secondary | ICD-10-CM | POA: Diagnosis not present

## 2021-09-10 DIAGNOSIS — Z95828 Presence of other vascular implants and grafts: Secondary | ICD-10-CM

## 2021-09-10 DIAGNOSIS — Z5112 Encounter for antineoplastic immunotherapy: Secondary | ICD-10-CM | POA: Diagnosis not present

## 2021-09-10 LAB — COMPREHENSIVE METABOLIC PANEL
ALT: 20 U/L (ref 0–44)
AST: 22 U/L (ref 15–41)
Albumin: 3.6 g/dL (ref 3.5–5.0)
Alkaline Phosphatase: 87 U/L (ref 38–126)
Anion gap: 8 (ref 5–15)
BUN: 16 mg/dL (ref 8–23)
CO2: 26 mmol/L (ref 22–32)
Calcium: 9 mg/dL (ref 8.9–10.3)
Chloride: 102 mmol/L (ref 98–111)
Creatinine, Ser: 0.72 mg/dL (ref 0.44–1.00)
GFR, Estimated: >60 mL/min (ref >60)
Glucose, Bld: 95 mg/dL (ref 70–99)
Potassium: 3.7 mmol/L (ref 3.5–5.1)
Sodium: 136 mmol/L (ref 135–145)
Total Bilirubin: 0.4 mg/dL (ref 0.3–1.2)
Total Protein: 7.3 g/dL (ref 6.5–8.1)

## 2021-09-10 LAB — CBC WITH DIFFERENTIAL/PLATELET
Abs Immature Granulocytes: 0 10*3/uL (ref 0.00–0.07)
Basophils Absolute: 0 10*3/uL (ref 0.0–0.1)
Basophils Relative: 1 %
Eosinophils Absolute: 0.1 10*3/uL (ref 0.0–0.5)
Eosinophils Relative: 4 %
HCT: 33.2 % — ABNORMAL LOW (ref 36.0–46.0)
Hemoglobin: 10.9 g/dL — ABNORMAL LOW (ref 12.0–15.0)
Immature Granulocytes: 0 %
Lymphocytes Relative: 37 %
Lymphs Abs: 1.2 10*3/uL (ref 0.7–4.0)
MCH: 33.2 pg (ref 26.0–34.0)
MCHC: 32.8 g/dL (ref 30.0–36.0)
MCV: 101.2 fL — ABNORMAL HIGH (ref 80.0–100.0)
Monocytes Absolute: 0.7 10*3/uL (ref 0.1–1.0)
Monocytes Relative: 22 %
Neutro Abs: 1.1 10*3/uL — ABNORMAL LOW (ref 1.7–7.7)
Neutrophils Relative %: 36 %
Platelets: 280 10*3/uL (ref 150–400)
RBC: 3.28 MIL/uL — ABNORMAL LOW (ref 3.87–5.11)
RDW: 13.6 % (ref 11.5–15.5)
WBC: 3.1 10*3/uL — ABNORMAL LOW (ref 4.0–10.5)
nRBC: 0 % (ref 0.0–0.2)

## 2021-09-10 LAB — TSH: TSH: 2.896 u[IU]/mL (ref 0.350–4.500)

## 2021-09-10 LAB — MAGNESIUM: Magnesium: 1.9 mg/dL (ref 1.7–2.4)

## 2021-09-10 MED ORDER — SODIUM CHLORIDE 0.9 % IV SOLN
150.0000 mg | Freq: Once | INTRAVENOUS | Status: AC
  Administered 2021-09-10: 10:00:00 150 mg via INTRAVENOUS
  Filled 2021-09-10: qty 150

## 2021-09-10 MED ORDER — HEPARIN SOD (PORK) LOCK FLUSH 100 UNIT/ML IV SOLN
500.0000 [IU] | Freq: Once | INTRAVENOUS | Status: AC | PRN
  Administered 2021-09-10: 13:00:00 500 [IU]

## 2021-09-10 MED ORDER — DOXORUBICIN HCL CHEMO IV INJECTION 2 MG/ML
30.0000 mg/m2 | Freq: Once | INTRAVENOUS | Status: AC
  Administered 2021-09-10: 12:00:00 58 mg via INTRAVENOUS
  Filled 2021-09-10: qty 29

## 2021-09-10 MED ORDER — SODIUM CHLORIDE 0.9 % IV SOLN
10.0000 mg | Freq: Once | INTRAVENOUS | Status: AC
  Administered 2021-09-10: 10:00:00 10 mg via INTRAVENOUS
  Filled 2021-09-10: qty 1

## 2021-09-10 MED ORDER — SODIUM CHLORIDE 0.9 % IV SOLN
300.0000 mg/m2 | Freq: Once | INTRAVENOUS | Status: AC
  Administered 2021-09-10: 12:00:00 580 mg via INTRAVENOUS
  Filled 2021-09-10: qty 29

## 2021-09-10 MED ORDER — SODIUM CHLORIDE 0.9 % IV SOLN: Freq: Once | INTRAVENOUS | Status: AC

## 2021-09-10 MED ORDER — PALONOSETRON HCL INJECTION 0.25 MG/5ML
0.2500 mg | Freq: Once | INTRAVENOUS | Status: AC
  Administered 2021-09-10: 10:00:00 0.25 mg via INTRAVENOUS
  Filled 2021-09-10: qty 5

## 2021-09-10 MED ORDER — SODIUM CHLORIDE 0.9 % IV SOLN
200.0000 mg | Freq: Once | INTRAVENOUS | Status: AC
  Administered 2021-09-10: 11:00:00 200 mg via INTRAVENOUS
  Filled 2021-09-10: qty 8

## 2021-09-10 MED ORDER — SODIUM CHLORIDE 0.9% FLUSH
10.0000 mL | INTRAVENOUS | Status: DC | PRN
  Administered 2021-09-10: 13:00:00 10 mL

## 2021-09-10 NOTE — Patient Instructions (Signed)
[place] at [facility] Discharge Instructions  You were seen and examined today by Dr. Delton Coombes. Proceed with treatment today at 50% dose.  Dr. Delton Coombes has recommended starting Claritin today and take it daily for one week.  You will see Dr. Delton Coombes with your next treatment in 3 weeks.   Thank you for choosing [place] at [facility] to provide your oncology and hematology care.  To afford each patient quality time with our provider, please arrive at least 15 minutes before your scheduled appointment time.   If you have a lab appointment with the [place] please come in thru the Main Entrance and check in at the main information desk.  You need to re-schedule your appointment should you arrive 10 or more minutes late.  We strive to give you quality time with our providers, and arriving late affects you and other patients whose appointments are after yours.  Also, if you no show three or more times for appointments you may be dismissed from the clinic at the providers discretion.     Again, thank you for choosing [facility].  Our hope is that these requests will decrease the amount of time that you wait before being seen by our physicians.       _____________________________________________________________  Should you have questions after your visit to [facility], please contact our office at [phone] and follow the prompts.  Our office hours are 8:00 a.m. and 4:30 p.m. Monday - Friday.  Please note that voicemails left after 4:00 p.m. may not be returned until the following business day.  We are closed weekends and major holidays.  You do have access to a nurse 24-7, just call the main number to the clinic [phone] and do not press any options, hold on the line and a nurse will answer the phone.    For prescription refill requests, have your pharmacy contact our office and allow 72 hours.     Due to Covid, you will need to wear a mask upon entering the hospital. If you do not have a mask, a mask will be given to you at the Main Entrance upon arrival. For doctor visits, patients may have 1 support person age 74 or older with them. For treatment visits, patients can not have anyone with them due to social distancing guidelines and our immunocompromised population.

## 2021-09-10 NOTE — Progress Notes (Signed)
Pt presents today for Keytruda, Adriamycin, and Cytoxan per provider's order. Vital signs and labs WNL for treatment today. Okay for treatment today per Dr.K.  Beryle Flock, Adriamycin, and Cytoxan given today per MD orders. Tolerated infusion without adverse affects. Vital signs stable. No complaints at this time. Discharged from clinic ambulatory in stable condition. Alert and oriented x 3. F/U with [facility] as scheduled.

## 2021-09-10 NOTE — Progress Notes (Signed)
Patient seen, examined and labs reviewed today by Dr. Delton Coombes. Okay to proceed with treatment at 50% dosage per Dr. Delton Coombes. Pharmacy and Primary RN made aware.

## 2021-09-10 NOTE — Patient Instructions (Signed)
[place]  Discharge Instructions: Thank you for choosing [place] to provide your oncology and hematology care.  If you have a lab appointment with the [place], please come in thru the Main Entrance and check in at the main information desk.  Wear comfortable clothing and clothing appropriate for easy access to any Portacath or PICC line.   We strive to give you quality time with your provider. You may need to reschedule your appointment if you arrive late (15 or more minutes).  Arriving late affects you and other patients whose appointments are after yours.  Also, if you miss three or more appointments without notifying the office, you may be dismissed from the clinic at the providers discretion.      For prescription refill requests, have your pharmacy contact our office and allow 72 hours for refills to be completed.    Today you received the following chemotherapy and/or immunotherapy agents Keytruda,Adriamycin, and Cytoxan.   To help prevent nausea and vomiting after your treatment, we encourage you to take your nausea medication as directed.  BELOW ARE SYMPTOMS THAT SHOULD BE REPORTED IMMEDIATELY: *FEVER GREATER THAN 100.4 F (38 C) OR HIGHER *CHILLS OR SWEATING *NAUSEA AND VOMITING THAT IS NOT CONTROLLED WITH YOUR NAUSEA MEDICATION *UNUSUAL SHORTNESS OF BREATH *UNUSUAL BRUISING OR BLEEDING *URINARY PROBLEMS (pain or burning when urinating, or frequent urination) *BOWEL PROBLEMS (unusual diarrhea, constipation, pain near the anus) TENDERNESS IN MOUTH AND THROAT WITH OR WITHOUT PRESENCE OF ULCERS (sore throat, sores in mouth, or a toothache) UNUSUAL RASH, SWELLING OR PAIN  UNUSUAL VAGINAL DISCHARGE OR ITCHING   Items with * indicate a potential emergency and should be followed up as soon as possible or go to the Emergency Department if any problems should occur.  Please show the CHEMOTHERAPY ALERT CARD or IMMUNOTHERAPY ALERT CARD at check-in to  the Emergency Department and triage nurse.  Should you have questions after your visit or need to cancel or reschedule your appointment, please contact [facility] [phone]  and follow the prompts.  Office hours are 8:00 a.m. to 4:30 p.m. Monday - Friday. Please note that voicemails left after 4:00 p.m. may not be returned until the following business day.  We are closed weekends and major holidays. You have access to a nurse at all times for urgent questions. Please call the main number to the clinic [phone] and follow the prompts.  For any non-urgent questions, you may also contact your provider using MyChart. We now offer e-Visits for anyone 15 and older to request care online for non-urgent symptoms. For details visit [email or web address].   Also download the MyChart app! Go to the app store, search "MyChart", open the app, select [place], and log in with your MyChart username and password.  Due to Covid, a mask is required upon entering the hospital/clinic. If you do not have a mask, one will be given to you upon arrival. For doctor visits, patients may have 1 support person aged 50 or older with them. For treatment visits, patients cannot have anyone with them due to current Covid guidelines and our immunocompromised population.

## 2021-09-11 ENCOUNTER — Other Ambulatory Visit (HOSPITAL_COMMUNITY): Payer: Medicare Other

## 2021-09-11 ENCOUNTER — Ambulatory Visit (HOSPITAL_COMMUNITY): Payer: Medicare Other

## 2021-09-11 ENCOUNTER — Inpatient Hospital Stay (HOSPITAL_COMMUNITY): Payer: Medicare Other

## 2021-09-11 ENCOUNTER — Ambulatory Visit (HOSPITAL_COMMUNITY): Payer: Medicare Other | Admitting: Hematology

## 2021-09-11 VITALS — BP 142/82 | HR 68 | Temp 98.8°F | Resp 20

## 2021-09-11 DIAGNOSIS — Z5111 Encounter for antineoplastic chemotherapy: Secondary | ICD-10-CM | POA: Diagnosis not present

## 2021-09-11 DIAGNOSIS — Z95828 Presence of other vascular implants and grafts: Secondary | ICD-10-CM

## 2021-09-11 DIAGNOSIS — C50912 Malignant neoplasm of unspecified site of left female breast: Secondary | ICD-10-CM

## 2021-09-11 DIAGNOSIS — E039 Hypothyroidism, unspecified: Secondary | ICD-10-CM | POA: Diagnosis not present

## 2021-09-11 DIAGNOSIS — Z79899 Other long term (current) drug therapy: Secondary | ICD-10-CM | POA: Diagnosis not present

## 2021-09-11 DIAGNOSIS — C50811 Malignant neoplasm of overlapping sites of right female breast: Secondary | ICD-10-CM | POA: Diagnosis not present

## 2021-09-11 DIAGNOSIS — Z5189 Encounter for other specified aftercare: Secondary | ICD-10-CM | POA: Diagnosis not present

## 2021-09-11 DIAGNOSIS — Z5112 Encounter for antineoplastic immunotherapy: Secondary | ICD-10-CM | POA: Diagnosis not present

## 2021-09-11 MED ORDER — PEGFILGRASTIM-JMDB 6 MG/0.6ML ~~LOC~~ SOSY
6.0000 mg | PREFILLED_SYRINGE | Freq: Once | SUBCUTANEOUS | Status: AC
  Administered 2021-09-11: 14:00:00 6 mg via SUBCUTANEOUS
  Filled 2021-09-11: qty 0.6

## 2021-09-11 NOTE — Progress Notes (Signed)
1400 patient is here today for pegfilgrastim injection per MD orders.  She reports that she is overall feeling okay. She is nervous about getting this injection because of all the side effects she has had in the past.  She did take her claritin and has taken the metoprolol and held her losartan.  She said that she still fears that she will feel badly after getting this.  She agreed to proceed.  Injection given per MD orders, see MAR for administration information.  Patient remained stable during injection and was discharged ambulatory and in stable condition from clinic.  She has stopped by the checkout desk to get her next appointments.

## 2021-09-12 ENCOUNTER — Ambulatory Visit (HOSPITAL_COMMUNITY): Payer: Medicare Other

## 2021-10-01 ENCOUNTER — Inpatient Hospital Stay (HOSPITAL_BASED_OUTPATIENT_CLINIC_OR_DEPARTMENT_OTHER): Payer: Medicare Other | Admitting: Hematology

## 2021-10-01 ENCOUNTER — Other Ambulatory Visit: Payer: Self-pay

## 2021-10-01 ENCOUNTER — Inpatient Hospital Stay (HOSPITAL_COMMUNITY): Payer: Medicare Other | Attending: Hematology

## 2021-10-01 ENCOUNTER — Inpatient Hospital Stay (HOSPITAL_COMMUNITY): Payer: Medicare Other

## 2021-10-01 VITALS — BP 129/83 | HR 64 | Temp 97.4°F | Resp 20

## 2021-10-01 DIAGNOSIS — Z79899 Other long term (current) drug therapy: Secondary | ICD-10-CM | POA: Diagnosis not present

## 2021-10-01 DIAGNOSIS — G629 Polyneuropathy, unspecified: Secondary | ICD-10-CM | POA: Diagnosis not present

## 2021-10-01 DIAGNOSIS — E039 Hypothyroidism, unspecified: Secondary | ICD-10-CM | POA: Insufficient documentation

## 2021-10-01 DIAGNOSIS — Z95828 Presence of other vascular implants and grafts: Secondary | ICD-10-CM

## 2021-10-01 DIAGNOSIS — C50912 Malignant neoplasm of unspecified site of left female breast: Secondary | ICD-10-CM

## 2021-10-01 DIAGNOSIS — Z5111 Encounter for antineoplastic chemotherapy: Secondary | ICD-10-CM | POA: Diagnosis not present

## 2021-10-01 DIAGNOSIS — Z5112 Encounter for antineoplastic immunotherapy: Secondary | ICD-10-CM | POA: Insufficient documentation

## 2021-10-01 DIAGNOSIS — Z171 Estrogen receptor negative status [ER-]: Secondary | ICD-10-CM | POA: Insufficient documentation

## 2021-10-01 DIAGNOSIS — C50811 Malignant neoplasm of overlapping sites of right female breast: Secondary | ICD-10-CM | POA: Insufficient documentation

## 2021-10-01 LAB — CBC WITH DIFFERENTIAL/PLATELET
Abs Immature Granulocytes: 0.01 10*3/uL (ref 0.00–0.07)
Basophils Absolute: 0 10*3/uL (ref 0.0–0.1)
Basophils Relative: 0 %
Eosinophils Absolute: 0.1 10*3/uL (ref 0.0–0.5)
Eosinophils Relative: 3 %
HCT: 30.1 % — ABNORMAL LOW (ref 36.0–46.0)
Hemoglobin: 9.9 g/dL — ABNORMAL LOW (ref 12.0–15.0)
Immature Granulocytes: 0 %
Lymphocytes Relative: 38 %
Lymphs Abs: 1.1 10*3/uL (ref 0.7–4.0)
MCH: 33.7 pg (ref 26.0–34.0)
MCHC: 32.9 g/dL (ref 30.0–36.0)
MCV: 102.4 fL — ABNORMAL HIGH (ref 80.0–100.0)
Monocytes Absolute: 0.6 10*3/uL (ref 0.1–1.0)
Monocytes Relative: 19 %
Neutro Abs: 1.2 10*3/uL — ABNORMAL LOW (ref 1.7–7.7)
Neutrophils Relative %: 40 %
Platelets: 244 10*3/uL (ref 150–400)
RBC: 2.94 MIL/uL — ABNORMAL LOW (ref 3.87–5.11)
RDW: 14.6 % (ref 11.5–15.5)
WBC: 3 10*3/uL — ABNORMAL LOW (ref 4.0–10.5)
nRBC: 0 % (ref 0.0–0.2)

## 2021-10-01 LAB — COMPREHENSIVE METABOLIC PANEL
ALT: 19 U/L (ref 0–44)
AST: 22 U/L (ref 15–41)
Albumin: 3.5 g/dL (ref 3.5–5.0)
Alkaline Phosphatase: 72 U/L (ref 38–126)
Anion gap: 8 (ref 5–15)
BUN: 17 mg/dL (ref 8–23)
CO2: 27 mmol/L (ref 22–32)
Calcium: 8.8 mg/dL — ABNORMAL LOW (ref 8.9–10.3)
Chloride: 104 mmol/L (ref 98–111)
Creatinine, Ser: 0.67 mg/dL (ref 0.44–1.00)
GFR, Estimated: >60 mL/min (ref >60)
Glucose, Bld: 97 mg/dL (ref 70–99)
Potassium: 3.7 mmol/L (ref 3.5–5.1)
Sodium: 139 mmol/L (ref 135–145)
Total Bilirubin: 0.4 mg/dL (ref 0.3–1.2)
Total Protein: 6.8 g/dL (ref 6.5–8.1)

## 2021-10-01 LAB — TSH: TSH: 1.991 u[IU]/mL (ref 0.350–4.500)

## 2021-10-01 LAB — MAGNESIUM: Magnesium: 1.8 mg/dL (ref 1.7–2.4)

## 2021-10-01 MED ORDER — SODIUM CHLORIDE 0.9 % IV SOLN: Freq: Once | INTRAVENOUS | Status: AC

## 2021-10-01 MED ORDER — PALONOSETRON HCL INJECTION 0.25 MG/5ML
0.2500 mg | Freq: Once | INTRAVENOUS | Status: AC
  Administered 2021-10-01: 0.25 mg via INTRAVENOUS
  Filled 2021-10-01: qty 5

## 2021-10-01 MED ORDER — SODIUM CHLORIDE 0.9 % IV SOLN
10.0000 mg | Freq: Once | INTRAVENOUS | Status: AC
  Administered 2021-10-01: 10 mg via INTRAVENOUS
  Filled 2021-10-01: qty 10

## 2021-10-01 MED ORDER — SODIUM CHLORIDE 0.9% FLUSH
10.0000 mL | INTRAVENOUS | Status: DC | PRN
  Administered 2021-10-01: 10 mL

## 2021-10-01 MED ORDER — SODIUM CHLORIDE 0.9 % IV SOLN
300.0000 mg/m2 | Freq: Once | INTRAVENOUS | Status: AC
  Administered 2021-10-01: 580 mg via INTRAVENOUS
  Filled 2021-10-01: qty 29

## 2021-10-01 MED ORDER — DOXORUBICIN HCL CHEMO IV INJECTION 2 MG/ML
30.0000 mg/m2 | Freq: Once | INTRAVENOUS | Status: AC
  Administered 2021-10-01: 58 mg via INTRAVENOUS
  Filled 2021-10-01: qty 29

## 2021-10-01 MED ORDER — SODIUM CHLORIDE 0.9 % IV SOLN
150.0000 mg | Freq: Once | INTRAVENOUS | Status: AC
  Administered 2021-10-01: 150 mg via INTRAVENOUS
  Filled 2021-10-01: qty 150

## 2021-10-01 MED ORDER — HEPARIN SOD (PORK) LOCK FLUSH 100 UNIT/ML IV SOLN
500.0000 [IU] | Freq: Once | INTRAVENOUS | Status: AC | PRN
  Administered 2021-10-01: 500 [IU]

## 2021-10-01 MED ORDER — SODIUM CHLORIDE 0.9 % IV SOLN
200.0000 mg | Freq: Once | INTRAVENOUS | Status: AC
  Administered 2021-10-01: 200 mg via INTRAVENOUS
  Filled 2021-10-01: qty 8

## 2021-10-01 NOTE — Progress Notes (Signed)
Patient has been examined by Dr. Katragadda, and vital signs and labs have been reviewed. ANC, Creatinine, LFTs, hemoglobin, and platelets are within treatment parameters per M.D. - pt may proceed with treatment.    °

## 2021-10-01 NOTE — Patient Instructions (Signed)
[facility]  Discharge Instructions: Thank you for choosing [facility] to provide your oncology and hematology care.  If you have a lab appointment with the [facility], please come in thru the Main Entrance and check in at the main information desk.  Wear comfortable clothing and clothing appropriate for easy access to any Portacath or PICC line.   We strive to give you quality time with your provider. You may need to reschedule your appointment if you arrive late (15 or more minutes).  Arriving late affects you and other patients whose appointments are after yours.  Also, if you miss three or more appointments without notifying the office, you may be dismissed from the clinic at the providers discretion.      For prescription refill requests, have your pharmacy contact our office and allow 72 hours for refills to be completed.    Today you received the following chemotherapy and/or immunotherapy agents Keytruda/Adriamycin/Cytoxan.   To help prevent nausea and vomiting after your treatment, we encourage you to take your nausea medication as directed.  BELOW ARE SYMPTOMS THAT SHOULD BE REPORTED IMMEDIATELY: *FEVER GREATER THAN 100.4 F (38 C) OR HIGHER *CHILLS OR SWEATING *NAUSEA AND VOMITING THAT IS NOT CONTROLLED WITH YOUR NAUSEA MEDICATION *UNUSUAL SHORTNESS OF BREATH *UNUSUAL BRUISING OR BLEEDING *URINARY PROBLEMS (pain or burning when urinating, or frequent urination) *BOWEL PROBLEMS (unusual diarrhea, constipation, pain near the anus) TENDERNESS IN MOUTH AND THROAT WITH OR WITHOUT PRESENCE OF ULCERS (sore throat, sores in mouth, or a toothache) UNUSUAL RASH, SWELLING OR PAIN  UNUSUAL VAGINAL DISCHARGE OR ITCHING   Items with * indicate a potential emergency and should be followed up as soon as possible or go to the Emergency Department if any problems should occur.  Please show the CHEMOTHERAPY ALERT CARD or IMMUNOTHERAPY ALERT CARD at check-in to the  Emergency Department and triage nurse.  Should you have questions after your visit or need to cancel or reschedule your appointment, please contact [facility] [phone]  and follow the prompts.  Office hours are 8:00 a.m. to 4:30 p.m. Monday - Friday. Please note that voicemails left after 4:00 p.m. may not be returned until the following business day.  We are closed weekends and major holidays. You have access to a nurse at all times for urgent questions. Please call the main number to the clinic [phone] and follow the prompts.  For any non-urgent questions, you may also contact your provider using MyChart. We now offer e-Visits for anyone 11 and older to request care online for non-urgent symptoms. For details visit [email or web address].   Also download the MyChart app! Go to the app store, search "MyChart", open the app, select , and log in with your MyChart username and password.  Due to Covid, a mask is required upon entering the hospital/clinic. If you do not have a mask, one will be given to you upon arrival. For doctor visits, patients may have 1 support person aged 58 or older with them. For treatment visits, patients cannot have anyone with them due to current Covid guidelines and our immunocompromised population.

## 2021-10-01 NOTE — Progress Notes (Signed)
Pt presents today for Keytruda/Adriamycin/Cytoxan per provider's order. Vital signs and labs WNL for treatment today. Okay to proceed with treatment per Dr.K. Pt voiced no new complaints at this time.  Keytruda/Adriamycin/Cytoxan given today per MD orders. Tolerated infusion without adverse affects. Vital signs stable. No complaints at this time. Discharged from clinic ambulatory in stable condition. Alert and oriented x 3. F/U with [facility] as scheduled.

## 2021-10-01 NOTE — Patient Instructions (Addendum)
[facility] ?Discharge Instructions ? ? ?You were seen and examined today by Dr. Katragadda. ? ?He reviewed your lab results which are normal/stable. ? ?We will proceed with your treatment today.  ? ?Return as scheduled.  ? ? ?Thank you for choosing [facility] at [facility] to provide your oncology and hematology care.  To afford each patient quality time with our provider, please arrive at least 15 minutes before your scheduled appointment time.  ? ?If you have a lab appointment with the Cancer Center please come in thru the Main Entrance and check in at the main information desk. ? ?You need to re-schedule your appointment should you arrive 10 or more minutes late.  We strive to give you quality time with our providers, and arriving late affects you and other patients whose appointments are after yours.  Also, if you no show three or more times for appointments you may be dismissed from the clinic at the providers discretion.     ?Again, thank you for choosing [facility].  Our hope is that these requests will decrease the amount of time that you wait before being seen by our physicians.       ?_____________________________________________________________ ? ?Should you have questions after your visit to [facility], please contact our office at [phone] and follow the prompts.  Our office hours are 8:00 a.m. and 4:30 p.m. Monday - Friday.  Please note that voicemails left after 4:00 p.m. may not be returned until the following business day.  We are closed weekends and major holidays.  You do have access to a nurse 24-7, just call the main number to the clinic [phone] and do not press any options, hold on the line and a nurse will answer the phone.   ? ?For prescription refill requests, have your pharmacy contact our office and allow 72 hours.   ? ?Due to Covid, you will need to wear a mask upon entering the hospital.  If you do not have a mask, a mask will be given to you at the Main Entrance upon arrival. For doctor visits, patients may have 1 support person age 18 or older with them. For treatment visits, patients can not have anyone with them due to social distancing guidelines and our immunocompromised population.  ? ?   ?

## 2021-10-01 NOTE — Progress Notes (Signed)
[place] [address], [place] [address]   CLINIC:  Medical Oncology/Hematology  PCP:  Neale Burly, MD [place] / [place] [id number] [phone]   REASON FOR VISIT:  Follow-up for left breast cancer  CURRENT THERAPY: Weekly carboplatin and paclitaxel with every 3 weeks pembrolizumab (keynote-522) started on 02/27/2021  BRIEF ONCOLOGIC HISTORY:  Oncology History  Invasive lobular carcinoma of left breast in female [facility])  01/23/2021 Initial Diagnosis   Invasive lobular carcinoma of left breast in female [facility])   02/19/2021 Genetic Testing   Negative genetic testing on the CancerNext-Expanded+RNAinsight panel.  SMARCB1 VUS identified.  The CancerNext-Expanded gene panel offered by [facility] and includes sequencing and rearrangement analysis for the following 77 genes: AIP, ALK, APC*, ATM*, AXIN2, BAP1, BARD1, BLM, BMPR1A, BRCA1*, BRCA2*, BRIP1*, CDC73, CDH1*, CDK4, CDKN1B, CDKN2A, CHEK2*, CTNNA1, DICER1, FANCC, FH, FLCN, GALNT12, KIF1B, LZTR1, MAX, MEN1, MET, MLH1*, MSH2*, MSH3, MSH6*, MUTYH*, NBN, NF1*, NF2, NTHL1, PALB2*, PHOX2B, PMS2*, POT1, PRKAR1A, PTCH1, PTEN*, RAD51C*, RAD51D*, RB1, RECQL, RET, SDHA, SDHAF2, SDHB, SDHC, SDHD, SMAD4, SMARCA4, SMARCB1, SMARCE1, STK11, SUFU, TMEM127, TP53*, TSC1, TSC2, VHL and XRCC2 (sequencing and deletion/duplication); EGFR, EGLN1, HOXB13, KIT, MITF, PDGFRA, POLD1, and POLE (sequencing only); EPCAM and GREM1 (deletion/duplication only). DNA and RNA analyses performed for * genes. The report date is February 19, 2021.   02/27/2021 -  Chemotherapy   Patient is on Treatment Plan : BREAST Pembrolizumab + Carboplatin D1,8,15+ Paclitaxel D1,8,15 q21d X 4 cycles / Pembrolizumab + AC q21d x 4 cycles       CANCER STAGING:  Cancer Staging  Invasive lobular carcinoma of left breast in female [facility]) Staging form: Breast, AJCC 8th Edition - Clinical stage from 01/23/2021: cT3, cN3c, G2, ER-, PR-, HER2- -  Unsigned   INTERVAL HISTORY:  [name], a 71 y.o. female, returns for routine follow-up and consideration for next cycle of chemotherapy. [name] was last seen on 09/10/2021.  Due for cycle #8 of AC + Keytruda today.   Overall, she tells me she has been feeling pretty well. She reports SOB with exertion and her cough has resolved.   Overall, she feels ready for next cycle of chemo today.   REVIEW OF SYSTEMS:  Review of Systems  Constitutional:  Negative for appetite change and fatigue.  Respiratory:  Positive for shortness of breath (with exertion). Negative for cough (resolved).   Cardiovascular:  Positive for palpitations.  Gastrointestinal:  Positive for constipation.  All other systems reviewed and are negative.  PAST MEDICAL/SURGICAL HISTORY:  Past Medical History:  Diagnosis Date   Asthma    as child   Cancer ([place]) 12/2020   left breast IMC   Complication of anesthesia    pateitn states,' I coded when I had my D&Cmany years ago.   Family history of breast cancer    Hypertension    Hypothyroidism    PONV (postoperative nausea and vomiting)    Port-A-Cath in place 02/26/2021   Past Surgical History:  Procedure Laterality Date   ABDOMINAL HYSTERECTOMY     BIOPSY  01/17/2018   Procedure: BIOPSY;  Surgeon: Rogene Houston, MD;  [facility];  Service: Endoscopy;;  duodenum,gastric   CHOLECYSTECTOMY     COLONOSCOPY WITH PROPOFOL N/A 01/17/2018   Procedure: COLONOSCOPY WITH PROPOFOL;  Surgeon: Rogene Houston, MD;  Location: [facility];  Service: Endoscopy;  Laterality: N/A;  7:30   DILATION AND CURETTAGE OF UTERUS  ESOPHAGOGASTRODUODENOSCOPY (EGD) WITH PROPOFOL N/A 01/17/2018   Procedure: ESOPHAGOGASTRODUODENOSCOPY (EGD) WITH PROPOFOL;  Surgeon: Rogene Houston, MD;  [facility];  Service: Endoscopy;  Laterality: N/A;   POLYPECTOMY  01/17/2018   Procedure: POLYPECTOMY;  Surgeon: Rogene Houston, MD;  Location[facility];  Service:  Endoscopy;;  transverse colon, cecal   PORTACATH PLACEMENT Right 02/17/2021   Procedure: INSERTION PORT-A-CATH;  Surgeon: Coralie Keens, MD;  Location: [facility];  Service: General;  Laterality: Right;    SOCIAL HISTORY:  Social History   Socioeconomic History   Marital status: Widowed    Spouse name: Not on file   Number of children: 3   Years of education: Not on file   Highest education level: Not on file  Occupational History   Occupation: [employer]   Occupation: retired  Tobacco Use   Smoking status: Never   Smokeless tobacco: Never  Scientific laboratory technician Use: Never used  Substance and Sexual Activity   Alcohol use: Never   Drug use: Never   Sexual activity: Not Currently    Birth control/protection: Surgical  Other Topics Concern   Not on file  Social History Narrative   Not on file   Social Determinants of Health   Financial Resource Strain: Low Risk    Difficulty of Paying Living Expenses: Not hard at all  Food Insecurity: No Food Insecurity   Worried About Charity fundraiser in the Last Year: Never true   [place] in the Last Year: Never true  Transportation Needs: No Transportation Needs   Lack of Transportation (Medical): No   Lack of Transportation (Non-Medical): No  Physical Activity: Insufficiently Active   Days of Exercise per Week: 3 days   Minutes of Exercise per Session: 30 min  Stress: No Stress Concern Present   Feeling of Stress : Not at all  Social Connections: Moderately Integrated   Frequency of Communication with Friends and Family: More than three times a week   Frequency of Social Gatherings with Friends and Family: More than three times a week   Attends Religious Services: More than 4 times per year   Active Member of Clubs or Organizations: No   Attends Music therapist: More than 4 times per year   Marital Status: Widowed  Human resources officer Violence: Not At Risk   Fear of Current  or Ex-Partner: No   Emotionally Abused: No   Physically Abused: No   Sexually Abused: No    FAMILY HISTORY:  Family History  Problem Relation Age of Onset   Breast cancer Mother        dx in her 18s   Heart disease Mother    Thyroid disease Mother    Lung cancer Father        dx in his 106s   Thyroid disease Maternal Aunt    Thyroid nodules Maternal Grandmother 35       goiter   Thyroid disease Maternal Grandmother    Heart disease Maternal Grandfather    Heart disease Paternal Grandmother    Heart disease Paternal Grandfather    Thyroid disease Daughter    Thyroid disease Daughter    Cancer Maternal Uncle        NOS   Cancer Paternal Uncle        NOS   Breast cancer Cousin        pat first cousin died in her 24s;  CURRENT MEDICATIONS:  Current Outpatient Medications  Medication Sig Dispense Refill   albuterol (VENTOLIN HFA) 108 (90 Base) MCG/ACT inhaler Inhale into the lungs.     amoxicillin (AMOXIL) 875 MG tablet Take 875 mg by mouth 2 (two) times daily.     amoxicillin-clavulanate (AUGMENTIN) 875-125 MG tablet Take 1 tablet by mouth 2 (two) times daily.     azelastine (ASTELIN) 0.1 % nasal spray Place into both nostrils.     CARBOPLATIN IV Inject into the vein once a week.     doxepin (SINEQUAN) 10 MG capsule Take 10 mg by mouth at bedtime.     famotidine (PEPCID) 20 MG tablet Take 1 tablet (20 mg total) by mouth daily. 30 tablet 0   gabapentin (NEURONTIN) 300 MG capsule Take 1 capsule by mouth at bedtime.     hydrochlorothiazide (MICROZIDE) 12.5 MG capsule Take 12.5 mg by mouth daily.     levothyroxine (SYNTHROID) 75 MCG tablet Take 100 mcg by mouth daily before breakfast.     lidocaine-prilocaine (EMLA) cream Apply a small amount to port a cath site and cover with plastic wrap 1 hour prior to infusion appointments 30 g 3   LORazepam (ATIVAN) 0.5 MG tablet Take 0.5 mg by mouth daily as needed.     losartan (COZAAR) 50 MG tablet Take 50 mg by mouth daily.      magnesium oxide (MAG-OX) 400 (240 Mg) MG tablet Take 1 tablet (400 mg total) by mouth daily. 60 tablet 2   magnesium oxide (MAG-OX) 400 MG tablet Take 1 tablet by mouth daily.     metoprolol succinate (TOPROL-XL) 25 MG 24 hr tablet Take 25 mg by mouth daily.     nystatin (MYCOSTATIN) 100000 UNIT/ML suspension Take by mouth.     Omega-3 Fatty Acids (FISH OIL PO) Take by mouth.     ondansetron (ZOFRAN) 8 MG tablet Take by mouth.     PACLITAXEL IV Inject into the vein once a week.     Pembrolizumab (KEYTRUDA IV) Inject into the vein every 21 ( twenty-one) days.     psyllium (METAMUCIL) 58.6 % packet Take by mouth.     rosuvastatin (CRESTOR) 5 MG tablet Take 5 mg by mouth daily.     Tiotropium Bromide-Olodaterol (STIOLTO RESPIMAT) 2.5-2.5 MCG/ACT AERS Inhale 1-2 puffs into the lungs as directed.     triamcinolone cream (KENALOG) 0.1 % Apply 1 application topically See admin instructions. Two to three times daily     Acetaminophen (TYLENOL PO) Take 1 tablet by mouth as needed. (Patient not taking: Reported on 10/01/2021)     benzonatate (TESSALON) 100 MG capsule Take 1 capsule (100 mg total) by mouth 3 (three) times daily as needed for cough. (Patient not taking: Reported on 10/01/2021) 60 capsule 0   filgrastim (NEUPOGEN) 480 MCG/0.8ML SOSY injection Inject 0.8 mLs (480 mcg total) into the skin daily as needed for up to 8 doses (self-administer subcutaneously as directed by Dr. Delton Coombes as needed for neutropenia). (Patient not taking: Reported on 10/01/2021) 6.4 mL 0   HYDROcodone-acetaminophen (NORCO) 5-325 MG tablet Take 1 tablet by mouth every 12 (twelve) hours as needed for moderate pain. (Patient not taking: Reported on 10/01/2021) 20 tablet 0   Melatonin 5 MG TABS Take 5 mg by mouth. As needed for sleep (Patient not taking: Reported on 10/01/2021)     prochlorperazine (COMPAZINE) 10 MG tablet Take 1 tablet (10 mg total) by mouth every 6 (six) hours as needed (Nausea or vomiting). (Patient not taking:  Reported on 10/01/2021) 30 tablet 1   No current facility-administered medications for this visit.    ALLERGIES:  Allergies  Allergen Reactions   Exforge [Amlodipine Besylate-Valsartan] Nausea And Vomiting   Cheese    Levofloxacin In D5w Hives   Strawberry Extract Diarrhea    Seeds,nuts, lettuce, grapes   Tramadol Hcl Nausea And Vomiting   Yeast-Related Products Hives    bread    PHYSICAL EXAM:  Performance status (ECOG): 1 - Symptomatic but completely ambulatory  There were no vitals filed for this visit. Wt Readings from Last 3 Encounters:  10/01/21 184 lb (83.5 kg)  09/10/21 179 lb 4.8 oz (81.3 kg)  08/21/21 178 lb 9.6 oz (81 kg)   Physical Exam Vitals reviewed.  Constitutional:      Appearance: Normal appearance.  Cardiovascular:     Rate and Rhythm: Normal rate and regular rhythm.     Pulses: Normal pulses.     Heart sounds: Normal heart sounds.  Pulmonary:     Effort: Pulmonary effort is normal.     Breath sounds: Normal breath sounds.  Chest:  Breasts:    Right: Normal. No swelling, bleeding, inverted nipple, mass, nipple discharge, skin change or tenderness.     Left: Mass (2 cm mass @ 12:00 position freely mobile) present. No swelling, bleeding, inverted nipple, nipple discharge, skin change or tenderness.  Lymphadenopathy:     Upper Body:     Right upper body: No supraclavicular, axillary or pectoral adenopathy.     Left upper body: No supraclavicular, axillary or pectoral adenopathy.  Neurological:     General: No focal deficit present.     Mental Status: She is alert and oriented to person, place, and time.  Psychiatric:        Mood and Affect: Mood normal.        Behavior: Behavior normal.    LABORATORY DATA:  I have reviewed the labs as listed.  CBC Latest Ref Rng & Units 10/01/2021 09/10/2021 08/21/2021  WBC 4.0 - 10.5 K/uL 3.0(L) 3.1(L) 6.7  Hemoglobin 12.0 - 15.0 g/dL 9.9(L) 10.9(L) 11.3(L)  Hematocrit 36.0 - 46.0 % 30.1(L) 33.2(L) 34.8(L)   Platelets 150 - 400 K/uL 244 280 225   CMP Latest Ref Rng & Units 10/01/2021 09/10/2021 08/21/2021  Glucose 70 - 99 mg/dL 97 95 119(H)  BUN 8 - 23 mg/dL '17 16 17  ' Creatinine 0.44 - 1.00 mg/dL 0.67 0.72 0.78  Sodium 135 - 145 mmol/L 139 136 136  Potassium 3.5 - 5.1 mmol/L 3.7 3.7 3.8  Chloride 98 - 111 mmol/L 104 102 101  CO2 22 - 32 mmol/L '27 26 28  ' Calcium 8.9 - 10.3 mg/dL 8.8(L) 9.0 9.2  Total Protein 6.5 - 8.1 g/dL 6.8 7.3 7.6  Total Bilirubin 0.3 - 1.2 mg/dL 0.4 0.4 0.4  Alkaline Phos 38 - 126 U/L 72 87 87  AST 15 - 41 U/L '22 22 24  ' ALT 0 - 44 U/L '19 20 25    ' DIAGNOSTIC IMAGING:  I have independently reviewed the scans and discussed with the patient. No results found.   ASSESSMENT:  1.  T3N3c (stage IIIc) triple negative invasive lobular carcinoma of the left breast: - She felt lump in her left breast for more than 6 months, but thought it was secondary to fibrocystic disease which she had all her life.  When she started having pain, she reached out to Dr. Sherrie Sport. - She previously had mammogram machine malfunction and had severe pain and traumatized  by that experience.  She was having ultrasound of the breast every other year since then. - She was on hormone replacement therapy for close to 10 years (from age [age]). - Ultrasound of the left breast on 01/08/2021 showed hyper vascular hypoechoic mass at 12 o'clock position measuring 3.8 x 1.3 x 2.5 cm.  There are calcifications located within the mass.  Mass extends to the level of the skin with mild associated skin thickening.  At least 3 morphologically abnormal lymph nodes in the left axilla. - Mammogram showed irregular spiculated mass with associated calcifications in the retroareolar to upper left breast measuring approximately 6 cm.  There are at least 4 morphologically abnormal lymph nodes identified in the left axilla. - Ultrasound-guided left breast and left axillary lymph node biopsy on 01/15/2021 - Pathology consistent with  invasive lobular carcinoma, E-cadherin negative.  ER/PR/HER2 negative.  HER2 2+ by IHC, negative by FISH.  Ki-67 is 5%.  Lymph node core biopsy was consistent with metastatic carcinoma.  Grade 2. - PET scan on 02/03/2021 showed involvement of left supraclavicular, subpectoral, axillary lymph nodes along with the breast mass.  10 mm left lung nodule which is hypometabolic.  Spinal cord lesion at T12 level with a strong uptake. - MRI of the lumbar spine with and without contrast on 02/20/2021 showed no mass or abnormal enhancement within the canal at the T12 level to correspond to the site of PET scan positive.  No marrow replacing bone lesion. - 2D echo on 02/21/2021 with EF 55-60%. - Weekly carboplatin and paclitaxel with every 3 weeks pembrolizumab (keynote-522) started on 02/27/2021. - Cycle 1 of AC on 07/04/2021.  Admission to [facility] with neutropenic fever. - Cycle 2 AC with 50% dose reduction on 08/21/2021.   2.  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  She is non-smoker. - Mother died of breast cancer.  Maternal grandmother died very young in her 91s, sister has fibrocystic disease.  Father died of lung cancer and was a smoker.   PLAN:  1.  T3N3c triple negative invasive lobular carcinoma of the left breast: - She tolerated her cycle 3 of AC very well.  She denies any immunotherapy related side effects. - Reviewed labs today which showed normal LFTs.  CBC shows white count is 3.0 with ANC of 1.2. - Physical examination today shows 2 cm mass in the 12 o'clock position of the left breast.  No clearly palpable lymphadenopathy in the axilla.  Slightly retracted left nipple. - Proceed with cycle 4 of AC today. - We will plan to repeat PET scan and breast MRI. - We will plan to schedule follow-up with Dr. Ninfa Linden to schedule surgery.  2.  Hypothyroidism: - Continue Synthroid 75 mcg daily.  TSH today is 1.9.  3.  Peripheral  neuropathy: - Numbness in the toes on and off has been stable.  4.  Hypomagnesemia: - Continue magnesium twice daily.   Orders placed this encounter:  Orders Placed This Encounter  Procedures   NM PET Image Restag (PS) Skull Base To Thigh   MR BREAST LEFT W WO CONTRAST INC CAD     Derek Jack, MD [place] [phone]   I, Thana Ates, am acting as a scribe for Dr. Derek Jack.  I, Derek Jack MD, have reviewed the above documentation for accuracy and completeness, and I agree with the above.

## 2021-10-02 ENCOUNTER — Inpatient Hospital Stay (HOSPITAL_COMMUNITY): Payer: Medicare Other

## 2021-10-02 VITALS — BP 110/77 | HR 70 | Temp 97.8°F | Resp 18

## 2021-10-02 DIAGNOSIS — Z5112 Encounter for antineoplastic immunotherapy: Secondary | ICD-10-CM | POA: Diagnosis not present

## 2021-10-02 DIAGNOSIS — C50811 Malignant neoplasm of overlapping sites of right female breast: Secondary | ICD-10-CM | POA: Diagnosis not present

## 2021-10-02 DIAGNOSIS — Z171 Estrogen receptor negative status [ER-]: Secondary | ICD-10-CM | POA: Diagnosis not present

## 2021-10-02 DIAGNOSIS — C50912 Malignant neoplasm of unspecified site of left female breast: Secondary | ICD-10-CM

## 2021-10-02 DIAGNOSIS — E039 Hypothyroidism, unspecified: Secondary | ICD-10-CM | POA: Diagnosis not present

## 2021-10-02 DIAGNOSIS — G629 Polyneuropathy, unspecified: Secondary | ICD-10-CM | POA: Diagnosis not present

## 2021-10-02 DIAGNOSIS — Z95828 Presence of other vascular implants and grafts: Secondary | ICD-10-CM

## 2021-10-02 DIAGNOSIS — Z5111 Encounter for antineoplastic chemotherapy: Secondary | ICD-10-CM | POA: Diagnosis not present

## 2021-10-02 MED ORDER — PEGFILGRASTIM-JMDB 6 MG/0.6ML ~~LOC~~ SOSY
6.0000 mg | PREFILLED_SYRINGE | Freq: Once | SUBCUTANEOUS | Status: AC
  Administered 2021-10-02: 6 mg via SUBCUTANEOUS
  Filled 2021-10-02: qty 0.6

## 2021-10-02 NOTE — Patient Instructions (Signed)
[facility]  Discharge Instructions: Thank you for choosing [facility] to provide your oncology and hematology care.  If you have a lab appointment with the [facility], please come in thru the Main Entrance and check in at the main information desk.  Wear comfortable clothing and clothing appropriate for easy access to any Portacath or PICC line.   We strive to give you quality time with your provider. You may need to reschedule your appointment if you arrive late (15 or more minutes).  Arriving late affects you and other patients whose appointments are after yours.  Also, if you miss three or more appointments without notifying the office, you may be dismissed from the clinic at the providers discretion.      For prescription refill requests, have your pharmacy contact our office and allow 72 hours for refills to be completed.    Today you received Fulphila injection    To help prevent nausea and vomiting after your treatment, we encourage you to take your nausea medication as directed.  BELOW ARE SYMPTOMS THAT SHOULD BE REPORTED IMMEDIATELY: *FEVER GREATER THAN 100.4 F (38 C) OR HIGHER *CHILLS OR SWEATING *NAUSEA AND VOMITING THAT IS NOT CONTROLLED WITH YOUR NAUSEA MEDICATION *UNUSUAL SHORTNESS OF BREATH *UNUSUAL BRUISING OR BLEEDING *URINARY PROBLEMS (pain or burning when urinating, or frequent urination) *BOWEL PROBLEMS (unusual diarrhea, constipation, pain near the anus) TENDERNESS IN MOUTH AND THROAT WITH OR WITHOUT PRESENCE OF ULCERS (sore throat, sores in mouth, or a toothache) UNUSUAL RASH, SWELLING OR PAIN  UNUSUAL VAGINAL DISCHARGE OR ITCHING   Items with * indicate a potential emergency and should be followed up as soon as possible or go to the Emergency Department if any problems should occur.  Please show the CHEMOTHERAPY ALERT CARD or IMMUNOTHERAPY ALERT CARD at check-in to the Emergency Department and triage nurse.  Should you have  questions after your visit or need to cancel or reschedule your appointment, please contact [facility] [phone]  and follow the prompts.  Office hours are 8:00 a.m. to 4:30 p.m. Monday - Friday. Please note that voicemails left after 4:00 p.m. may not be returned until the following business day.  We are closed weekends and major holidays. You have access to a nurse at all times for urgent questions. Please call the main number to the clinic [phone] and follow the prompts.  For any non-urgent questions, you may also contact your provider using MyChart. We now offer e-Visits for anyone 50 and older to request care online for non-urgent symptoms. For details visit [email or web address].   Also download the MyChart app! Go to the app store, search "MyChart", open the app, select [facility], and log in with your MyChart username and password.  Due to Covid, a mask is required upon entering the hospital/clinic. If you do not have a mask, one will be given to you upon arrival. For doctor visits, patients may have 1 support person aged 60 or older with them. For treatment visits, patients cannot have anyone with them due to current Covid guidelines and our immunocompromised population.

## 2021-10-02 NOTE — Progress Notes (Signed)
Yer Castello presents today for injection per the provider's orders.  Fulphila administration without incident; injection site WNL; see MAR for injection details.  Patient tolerated procedure well and without incident.  No questions or complaints noted at this time.   Discharged from clinic ambulatory in stable condition. Alert and oriented x 3. F/U with [facility] as scheduled.

## 2021-10-03 DIAGNOSIS — C50912 Malignant neoplasm of unspecified site of left female breast: Secondary | ICD-10-CM | POA: Diagnosis not present

## 2021-10-10 ENCOUNTER — Ambulatory Visit (HOSPITAL_COMMUNITY)
Admission: RE | Admit: 2021-10-10 | Discharge: 2021-10-10 | Disposition: A | Discharge status: Home / Self Care | Payer: Medicare Other | Source: Ambulatory Visit | Attending: Hematology | Admitting: Hematology

## 2021-10-10 ENCOUNTER — Other Ambulatory Visit: Payer: Self-pay

## 2021-10-10 DIAGNOSIS — C50912 Malignant neoplasm of unspecified site of left female breast: Secondary | ICD-10-CM | POA: Diagnosis not present

## 2021-10-10 IMAGING — MR MR BREAST BILAT WO/W CM
5 of 9 series · 28 of 48 positions shown · IV contrast (GADAVIST)
Comparison: PET-CT on [DATE], LEFT breast and axillary
ultrasound on [DATE] and bilateral mammogram on [DATE]

CLINICAL DATA: History of breast cancer. Assess treatment response.
Patient had ultrasound-guided core biopsy of LEFT breast mass in [DATE], showing invasive mammary carcinoma with mammary carcinoma
in situ. Biopsy of LEFT axillary lymph node at the same time showed
metastatic carcinoma. This is the patient's first breast MRI exam.

EXAM:
MR OF THE LEFT BREAST WITH AND WITHOUT CONTRAST
TECHNIQUE: Multiplanar multisequence MR images of the left breast were obtained
prior to and following the intravenous administration of 8.5 ml of
Gadavist.

[Series 8: T2 · axial · 3.0mm · 0.96mm/px · z∈[-12,+150]mm · 4 of 52 slices shown]
[im 1/52]
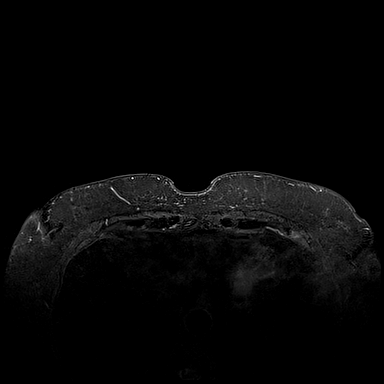
[im 18/52]
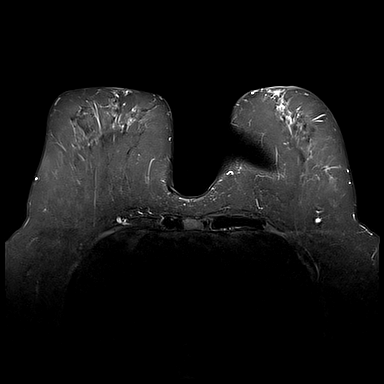
[im 35/52]
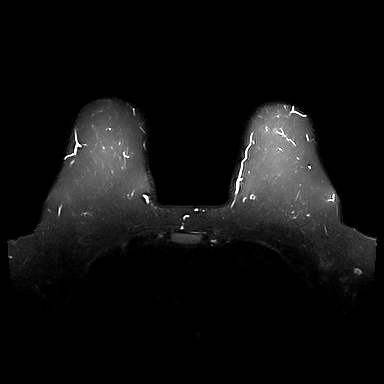
[im 52/52]
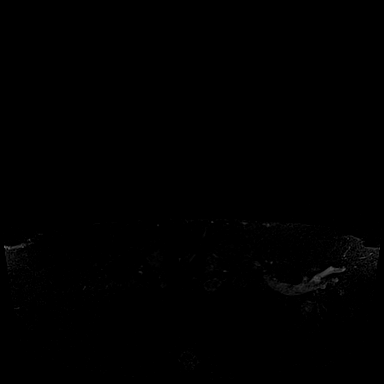

[Series 9: T1 fat-sat · axial · 1.2mm · 0.83mm/px · z∈[-17,+155]mm · 8 of 144 slices shown (1 of 3)]
[im 1/144]
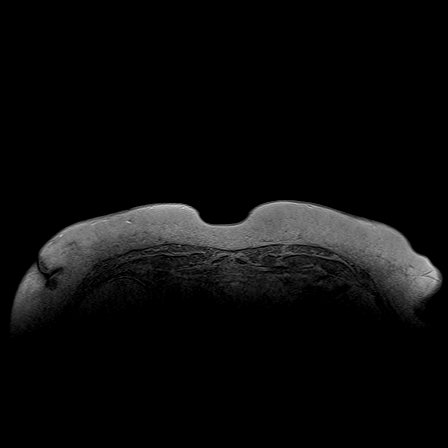
[im 21/144]
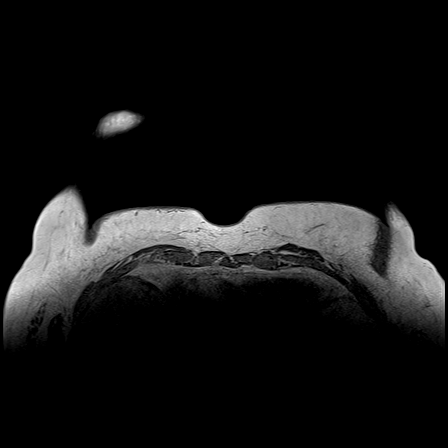
[im 41/144]
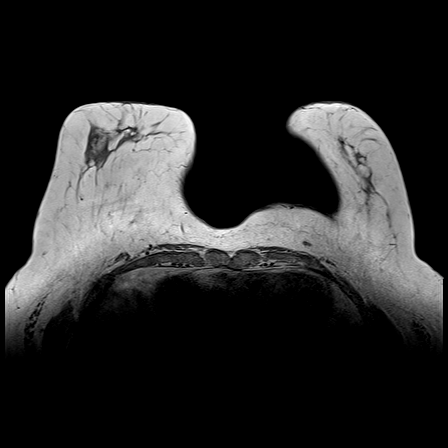
[im 62/144]
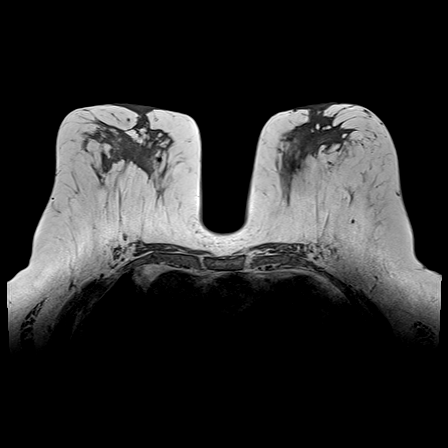
[im 82/144]
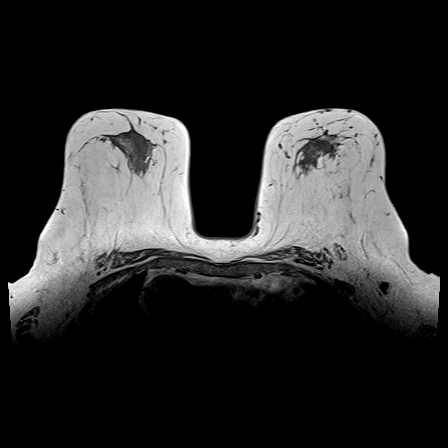
[im 103/144]
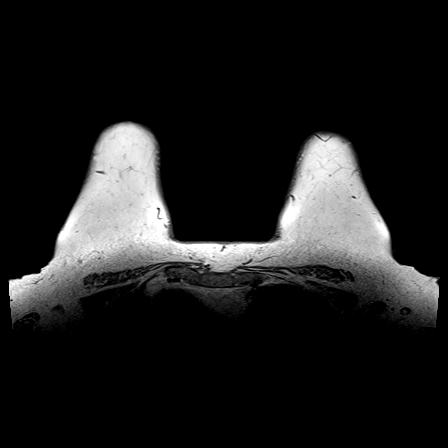
[im 123/144]
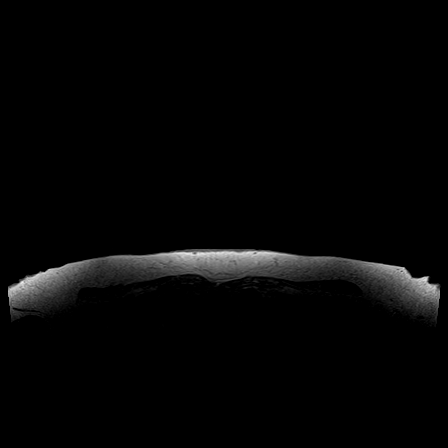
[im 144/144]
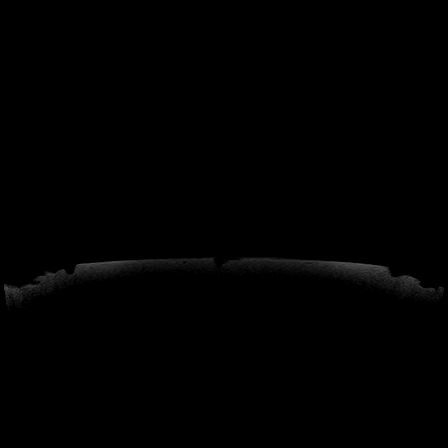

[Series 11: T1 fat-sat · axial · 1.2mm · 0.89mm/px · z∈[+2,+136]mm · 6 of 110 slices shown (2 of 3)]
[im 1/110]
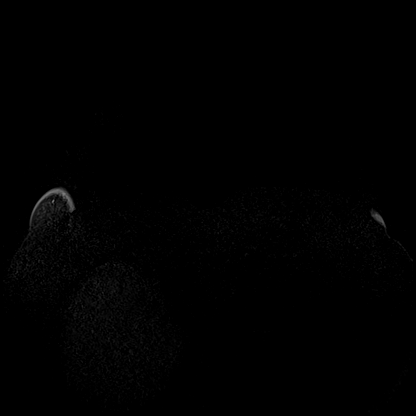
[im 22/110]
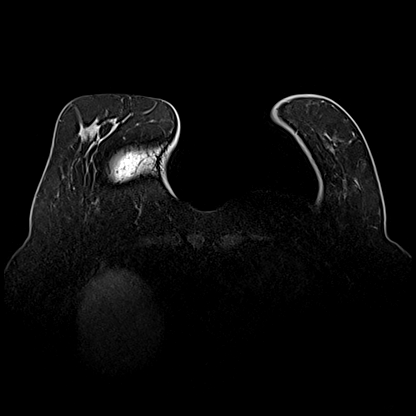
[im 44/110]
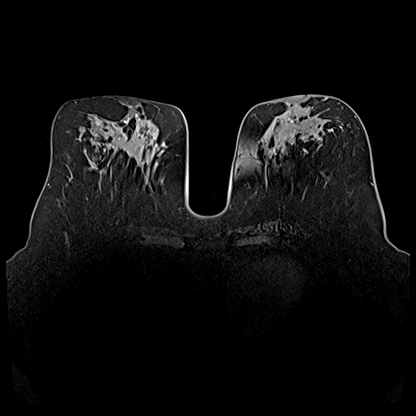
[im 66/110]
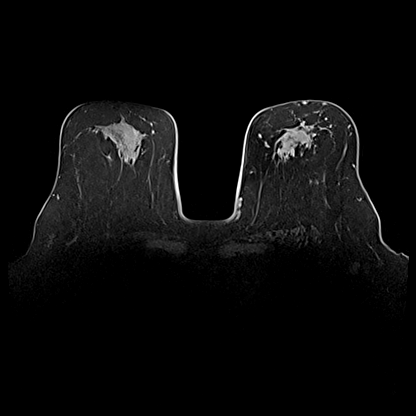
[im 88/110]
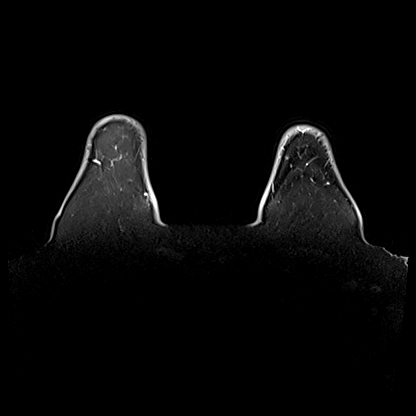
[im 110/110]
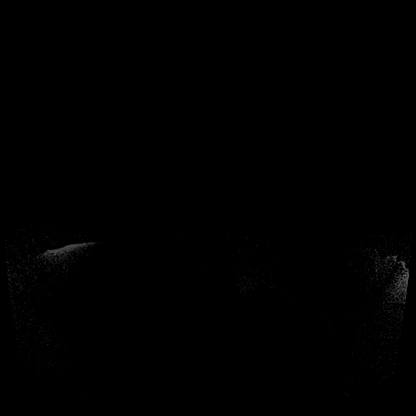

[Series 12: T1 fat-sat · axial · 1.2mm · 0.89mm/px · z∈[+2,+136]mm · 5 of 112 slices shown (3 of 3)]
[im 1/112]
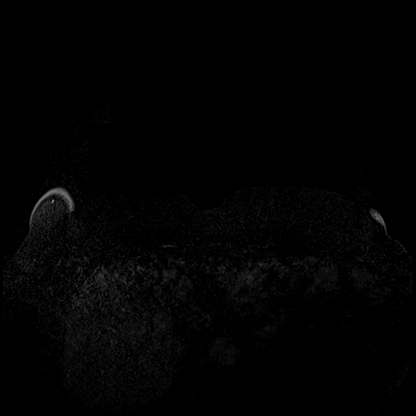
[im 28/112]
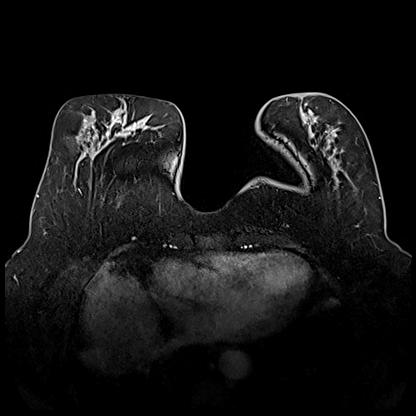
[im 56/112]
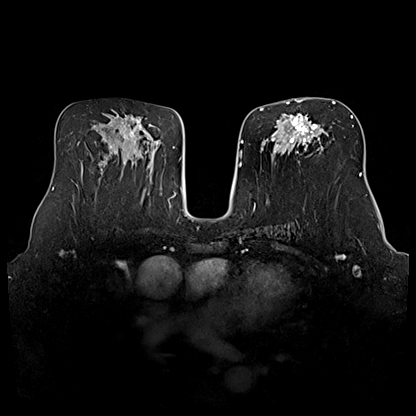
[im 84/112]
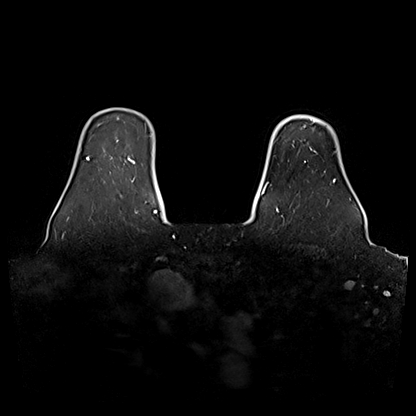
[im 112/112]
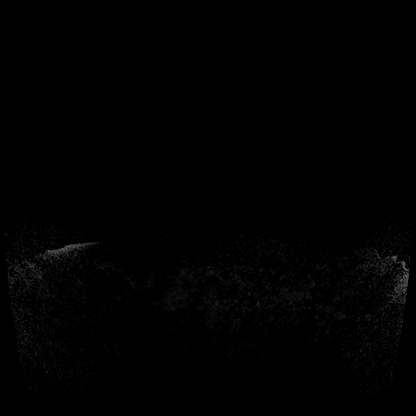

[Series 13: T1 · axial · 1.2mm · 0.89mm/px · z∈[+2,+136]mm · 5 of 112 slices shown]
[im 1/112]
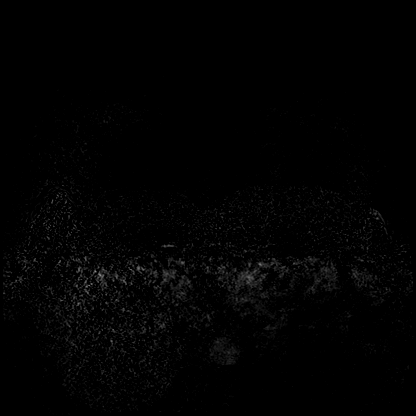
[im 28/112]
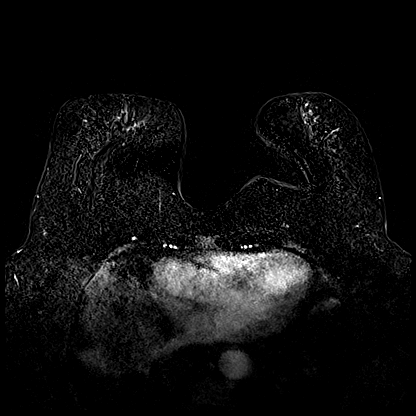
[im 56/112]
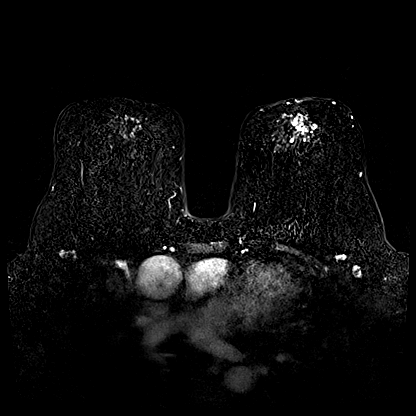
[im 84/112]
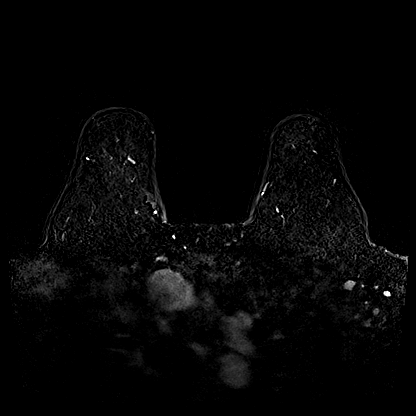
[im 112/112]
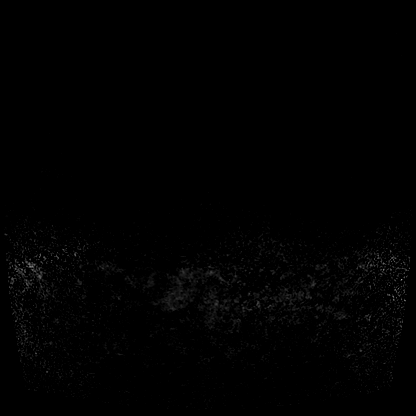

[28 of 48 positions shown; findings below may reference images not displayed]

Three-dimensional MR images were rendered by post-processing of the
original MR data on an independent workstation. The
three-dimensional MR images were interpreted, and findings are
reported in the following complete MRI report for this study. Three
dimensional images were evaluated at the independent [employer]
workstation
FINDINGS: Breast composition: c. Heterogeneous fibroglandular tissue.

Background parenchymal enhancement: Minimal

RIGHT breast: No mass or abnormal enhancement.

Left breast: Mass with surrounding clumped non mass enhancement and
confluent areas of enhancement in the UPPER central LEFT breast
spanning 3.6 x 2.8 x 3.9 (superior to inferior) centimeters. Mass
shows washout type enhancement kinetics and extends to the immediate
retroareolar region. Mass is associated with small parenchymal
distortion. Tissue marker clip is identified in the UPPER portion of
the mass. Otherwise the LEFT breast is negative.

Lymph nodes: The largest LEFT axillary lymph node is 1.3 centimeters
on image 15 of series 8. Multiple smaller lymph nodes are present
and are asymmetric compared to the RIGHT axilla. Axillary regions
are not fully evaluated, given the targeted imaging of the breast.

Ancillary findings:  None.
IMPRESSION: 1. 3.9 centimeter mass and non mass enhancement in the UPPER central
LEFT breast.
2. Largest LEFT axillary lymph node is 1.3 centimeters. The axillary
regions are not fully evaluated.
3. RIGHT breast is negative.

RECOMMENDATION:
Treatment plan for known LEFT breast malignancy.

BI-RADS CATEGORY  6: Known biopsy-proven malignancy.

## 2021-10-10 MED ORDER — GADOBUTROL 1 MMOL/ML IV SOLN
10.0000 mL | Freq: Once | INTRAVENOUS | Status: AC | PRN
  Administered 2021-10-10: 10 mL via INTRAVENOUS

## 2021-10-13 ENCOUNTER — Other Ambulatory Visit (HOSPITAL_COMMUNITY): Payer: Self-pay | Admitting: Hematology

## 2021-10-13 DIAGNOSIS — C50912 Malignant neoplasm of unspecified site of left female breast: Secondary | ICD-10-CM

## 2021-10-16 ENCOUNTER — Ambulatory Visit (HOSPITAL_COMMUNITY)
Admission: RE | Admit: 2021-10-16 | Discharge: 2021-10-16 | Disposition: A | Discharge status: Home / Self Care | Payer: Medicare Other | Source: Ambulatory Visit | Attending: Hematology | Admitting: Hematology

## 2021-10-16 ENCOUNTER — Other Ambulatory Visit: Payer: Self-pay

## 2021-10-16 DIAGNOSIS — C773 Secondary and unspecified malignant neoplasm of axilla and upper limb lymph nodes: Secondary | ICD-10-CM | POA: Diagnosis not present

## 2021-10-16 DIAGNOSIS — R590 Localized enlarged lymph nodes: Secondary | ICD-10-CM | POA: Diagnosis not present

## 2021-10-16 DIAGNOSIS — R911 Solitary pulmonary nodule: Secondary | ICD-10-CM | POA: Diagnosis not present

## 2021-10-16 DIAGNOSIS — C50912 Malignant neoplasm of unspecified site of left female breast: Secondary | ICD-10-CM | POA: Insufficient documentation

## 2021-10-16 IMAGING — CT NM PET TUM IMG RESTAG (PS) SKULL BASE T - THIGH
7 series · 25 of 25 positions shown · non-contrast
Comparison: PET-CT [DATE]

CLINICAL DATA: Subsequent treatment strategy for invasive breast
carcinoma. LEFT breast carcinoma with lymph node involvement.

EXAM:
NUCLEAR MEDICINE PET SKULL BASE TO THIGH
TECHNIQUE: 9.5 mCi F-18 FDG was injected intravenously. Full-ring PET imaging
was performed from the skull base to thigh after the radiotracer. CT
data was obtained and used for attenuation correction and anatomic
localization.
Fasting blood glucose: 102 mg/dl

[Series 3: ctac · axial · 3.0mm · 0.98mm/px · z∈[-840,-54]mm · 5 of 263 slices shown]
[im 1/263]
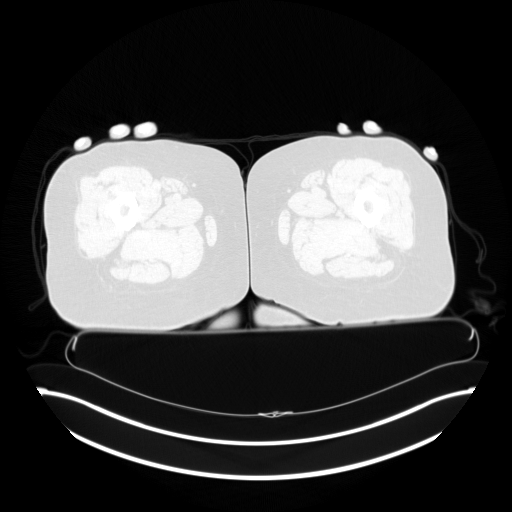
[im 66/263]
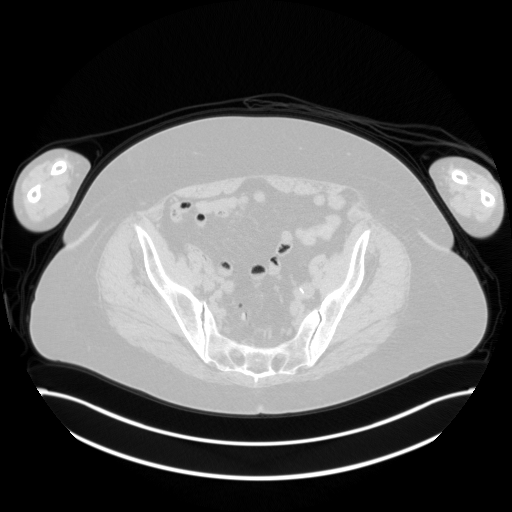
[im 132/263]
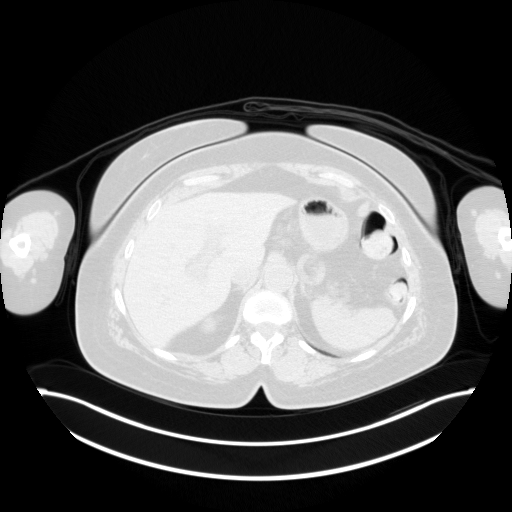
[im 197/263]
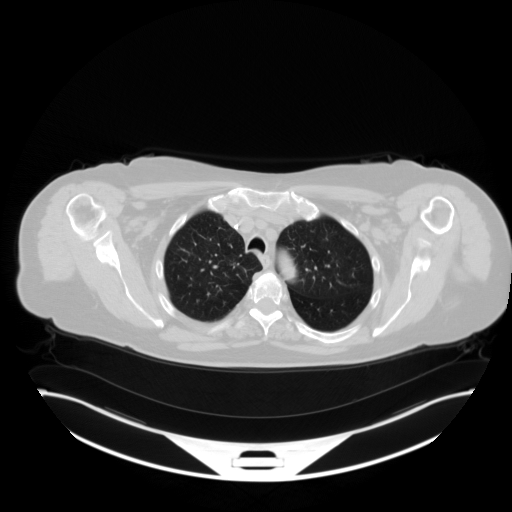
[im 263/263  brain]
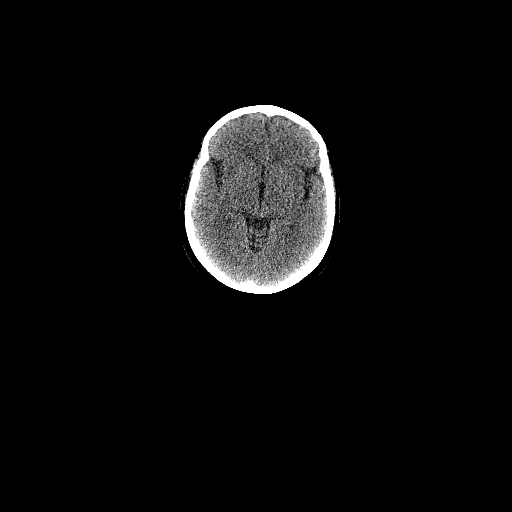

[Series 4: pet ac · axial · 3.0mm · 4.11mm/px · z∈[-840,-54]mm · 5 of 263 slices shown]
[im 1/263]
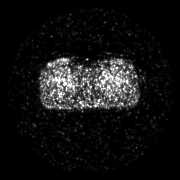
[im 66/263]
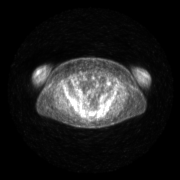
[im 132/263]
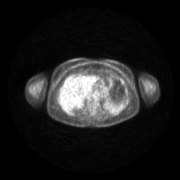
[im 197/263]
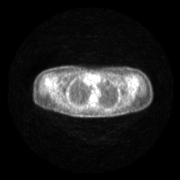
[im 263/263]
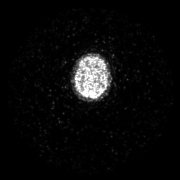

[Series 5: pet nac · axial · 3.0mm · 4.11mm/px · z∈[-840,-54]mm · 4 of 263 slices shown]
[im 1/263]
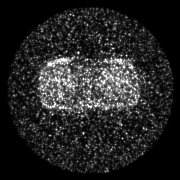
[im 88/263]
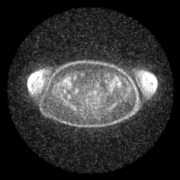
[im 175/263]
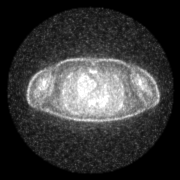
[im 263/263]
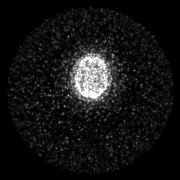

[Series 9: ct lung · axial · 3.0mm · 0.98mm/px · 1 of 87 slices shown]
[im 1/87]
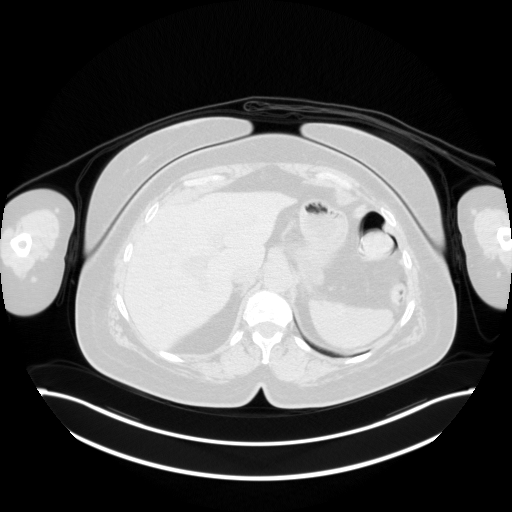

[Series 606: fused tra · 7 of 393 slices shown]
[im 1/393]
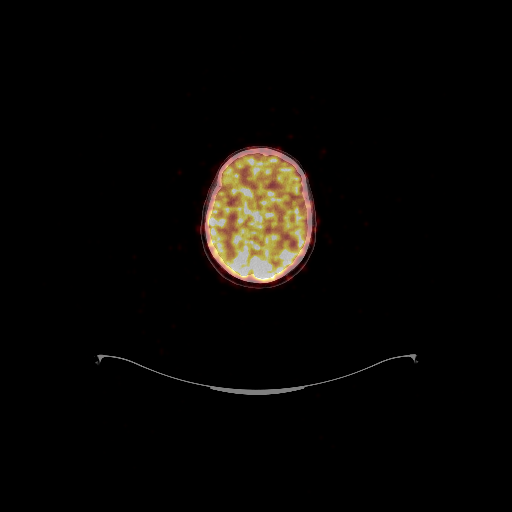
[im 66/393]
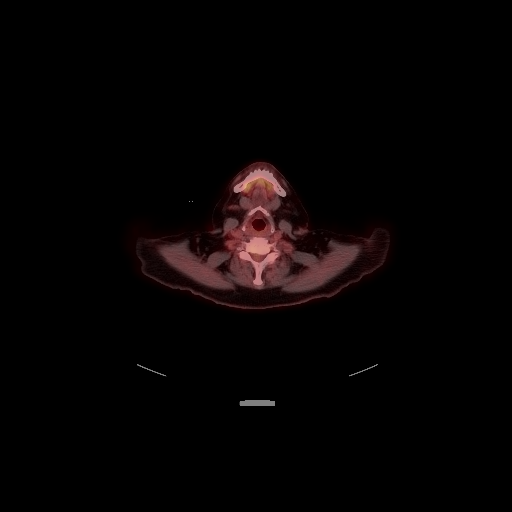
[im 131/393]
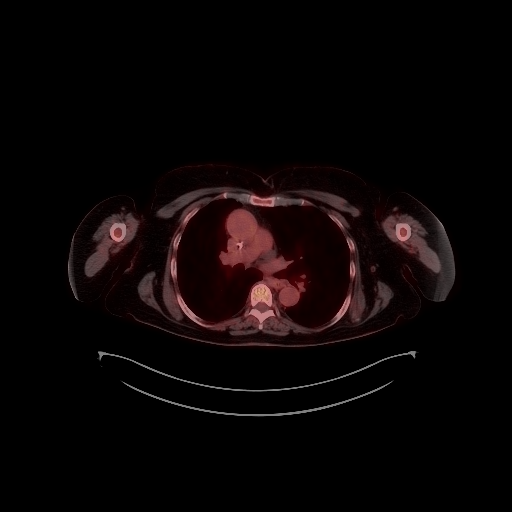
[im 197/393]
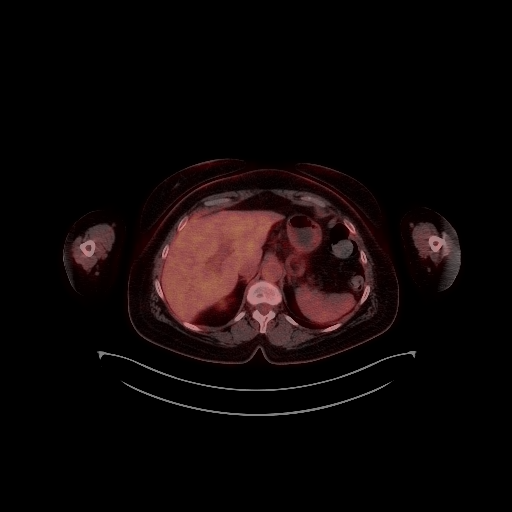
[im 262/393]
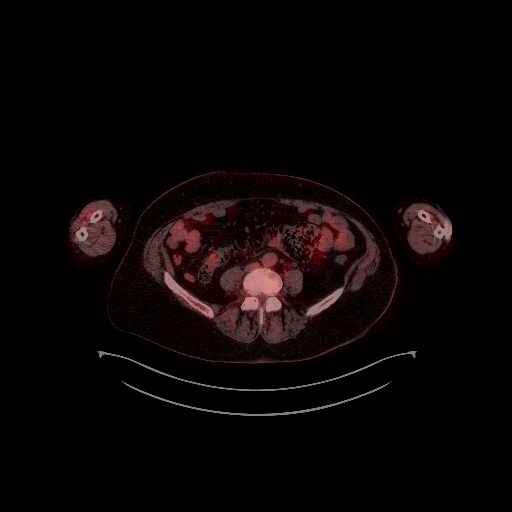
[im 327/393]
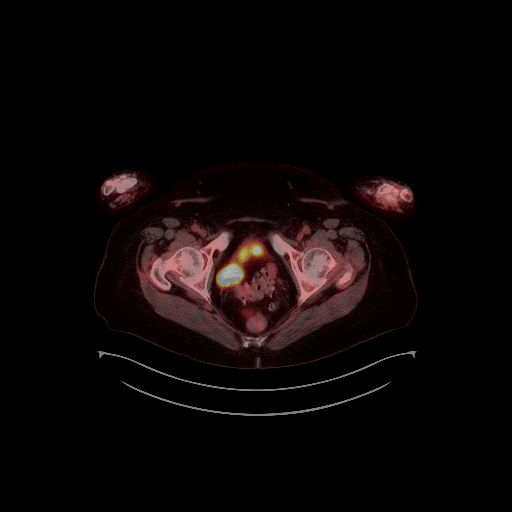
[im 393/393]
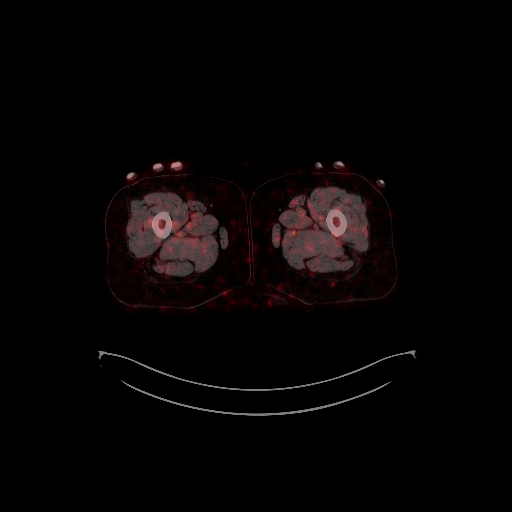

[Series 608: fused cor · 2 of 145 slices shown]
[im 1/145]
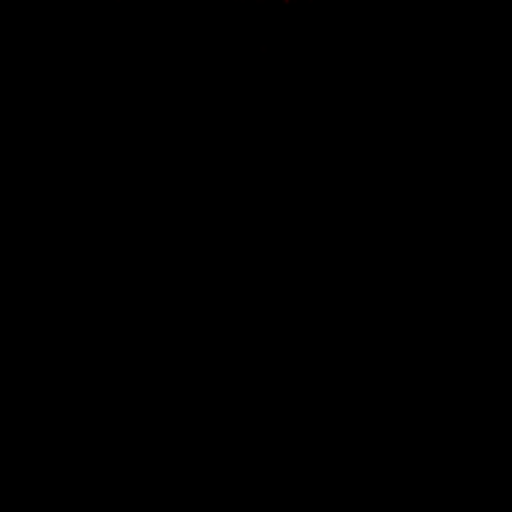
[im 145/145]
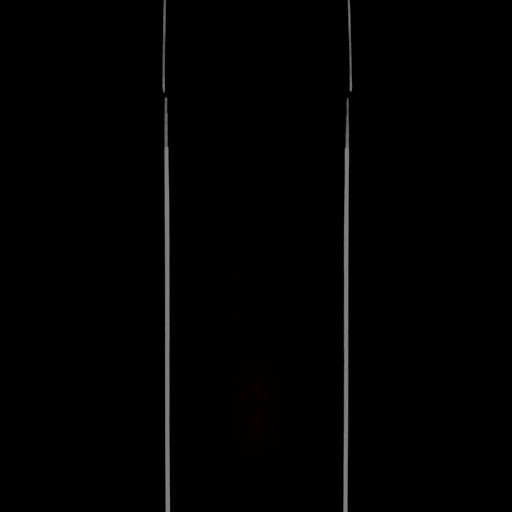

[Series 609: mip cine · coronal · 1.63mm/px · 1 of 48 slices shown]
[im 1/48]
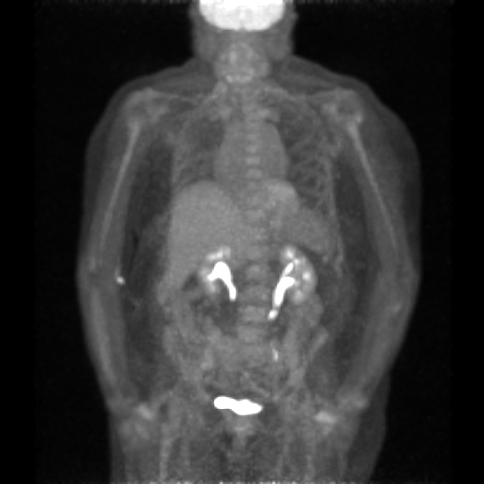

[25 of 25 positions shown; findings below may reference images not displayed]

FINDINGS: Mediastinal blood pool activity: SUV max

Liver activity: SUV max NA

NECK: No hypermetabolic lymph nodes in the neck.

Incidental CT findings: none

CHEST: Very low metabolic activity associated with LEFT axillary
lymph nodes. Largest lymph node measures 8 mm short axis (image
80/3) and has a fatty hilum (. Metabolic activity is very low with
SUV max equal 1.9 (image 80). Previously LEFT axillary lymph nodes
with SUV max equal

No asymmetric hypermetabolic activity within the breast tissue. Mild
symmetric metabolic breast tissue remains.

No hypermetabolic mediastinal or internal mammary lymph nodes.

Nodule within the LEFT upper lobe measures 9 mm (image 37/9) not
changed in size from prior. This nodule has no significant metabolic
activity.

Incidental CT findings: none

ABDOMEN/PELVIS: No abnormal hypermetabolic activity within the
liver, pancreas, adrenal glands, or spleen. No hypermetabolic lymph
nodes in the abdomen or pelvis.

Incidental CT findings: none

SKELETON: Heterogeneous marrow activity in the pelvis. Heterogeneous
marrow activity in the spine. No focal lesion identified. No
suspicious lesions CT portion exam.

Incidental CT findings: none
IMPRESSION: 1. Reduction in metabolic activity and size of LEFT axial lymph
nodes.
2. No evidence of active breast cancer on skull base FDG PET scan.
3. Heterogeneous marrow activity in the spine and pelvis is favored
related to anemia or chemotherapy treatment.

## 2021-10-16 MED ORDER — FLUDEOXYGLUCOSE F - 18 (FDG) INJECTION
9.4740 | Freq: Once | INTRAVENOUS | Status: AC
  Administered 2021-10-16: 9.474 via INTRAVENOUS

## 2021-10-22 ENCOUNTER — Inpatient Hospital Stay (HOSPITAL_COMMUNITY): Payer: Medicare Other | Attending: Hematology | Admitting: Hematology

## 2021-10-22 ENCOUNTER — Inpatient Hospital Stay (HOSPITAL_COMMUNITY): Payer: Medicare Other

## 2021-10-22 DIAGNOSIS — Z171 Estrogen receptor negative status [ER-]: Secondary | ICD-10-CM | POA: Insufficient documentation

## 2021-10-22 DIAGNOSIS — Z801 Family history of malignant neoplasm of trachea, bronchus and lung: Secondary | ICD-10-CM | POA: Diagnosis not present

## 2021-10-22 DIAGNOSIS — G629 Polyneuropathy, unspecified: Secondary | ICD-10-CM | POA: Insufficient documentation

## 2021-10-22 DIAGNOSIS — Z79899 Other long term (current) drug therapy: Secondary | ICD-10-CM | POA: Diagnosis not present

## 2021-10-22 DIAGNOSIS — C50912 Malignant neoplasm of unspecified site of left female breast: Secondary | ICD-10-CM | POA: Diagnosis not present

## 2021-10-22 DIAGNOSIS — Z803 Family history of malignant neoplasm of breast: Secondary | ICD-10-CM | POA: Insufficient documentation

## 2021-10-22 DIAGNOSIS — E039 Hypothyroidism, unspecified: Secondary | ICD-10-CM | POA: Diagnosis not present

## 2021-10-22 DIAGNOSIS — Z95828 Presence of other vascular implants and grafts: Secondary | ICD-10-CM

## 2021-10-22 LAB — CBC WITH DIFFERENTIAL/PLATELET
Abs Immature Granulocytes: 0.01 10*3/uL (ref 0.00–0.07)
Basophils Absolute: 0 10*3/uL (ref 0.0–0.1)
Basophils Relative: 1 %
Eosinophils Absolute: 0.1 10*3/uL (ref 0.0–0.5)
Eosinophils Relative: 3 %
HCT: 31.1 % — ABNORMAL LOW (ref 36.0–46.0)
Hemoglobin: 10.2 g/dL — ABNORMAL LOW (ref 12.0–15.0)
Immature Granulocytes: 0 %
Lymphocytes Relative: 34 %
Lymphs Abs: 1.2 10*3/uL (ref 0.7–4.0)
MCH: 33.6 pg (ref 26.0–34.0)
MCHC: 32.8 g/dL (ref 30.0–36.0)
MCV: 102.3 fL — ABNORMAL HIGH (ref 80.0–100.0)
Monocytes Absolute: 0.7 10*3/uL (ref 0.1–1.0)
Monocytes Relative: 20 %
Neutro Abs: 1.5 10*3/uL — ABNORMAL LOW (ref 1.7–7.7)
Neutrophils Relative %: 42 %
Platelets: 214 10*3/uL (ref 150–400)
RBC: 3.04 MIL/uL — ABNORMAL LOW (ref 3.87–5.11)
RDW: 14.6 % (ref 11.5–15.5)
WBC: 3.5 10*3/uL — ABNORMAL LOW (ref 4.0–10.5)
nRBC: 0 % (ref 0.0–0.2)

## 2021-10-22 LAB — COMPREHENSIVE METABOLIC PANEL
ALT: 20 U/L (ref 0–44)
AST: 25 U/L (ref 15–41)
Albumin: 3.9 g/dL (ref 3.5–5.0)
Alkaline Phosphatase: 73 U/L (ref 38–126)
Anion gap: 5 (ref 5–15)
BUN: 21 mg/dL (ref 8–23)
CO2: 26 mmol/L (ref 22–32)
Calcium: 8.9 mg/dL (ref 8.9–10.3)
Chloride: 106 mmol/L (ref 98–111)
Creatinine, Ser: 0.85 mg/dL (ref 0.44–1.00)
GFR, Estimated: >60 mL/min (ref >60)
Glucose, Bld: 108 mg/dL — ABNORMAL HIGH (ref 70–99)
Potassium: 3.4 mmol/L — ABNORMAL LOW (ref 3.5–5.1)
Sodium: 137 mmol/L (ref 135–145)
Total Bilirubin: 0.5 mg/dL (ref 0.3–1.2)
Total Protein: 6.9 g/dL (ref 6.5–8.1)

## 2021-10-22 LAB — TSH: TSH: 2.152 u[IU]/mL (ref 0.350–4.500)

## 2021-10-22 LAB — MAGNESIUM: Magnesium: 1.9 mg/dL (ref 1.7–2.4)

## 2021-10-22 MED ORDER — HEPARIN SOD (PORK) LOCK FLUSH 100 UNIT/ML IV SOLN
500.0000 [IU] | Freq: Once | INTRAVENOUS | Status: AC
  Administered 2021-10-22: 500 [IU] via INTRAVENOUS

## 2021-10-22 MED ORDER — SODIUM CHLORIDE 0.9% FLUSH
10.0000 mL | Freq: Once | INTRAVENOUS | Status: AC
  Administered 2021-10-22: 10 mL via INTRAVENOUS

## 2021-10-22 NOTE — Progress Notes (Signed)
Denise [name] [address], [place] [address]   Patient Care Team: Neale Burly, MD as PCP - General (Internal Medicine) Derek Jack, MD as Medical Oncologist (Medical Oncology)  SUMMARY OF ONCOLOGIC HISTORY: Oncology History  Invasive lobular carcinoma of left breast in female [facility])  01/23/2021 Initial Diagnosis   Invasive lobular carcinoma of left breast in female [facility])   02/19/2021 Genetic Testing   Negative genetic testing on the CancerNext-Expanded+RNAinsight panel.  SMARCB1 VUS identified.  The CancerNext-Expanded gene panel offered by [facility] and includes sequencing and rearrangement analysis for the following 77 genes: AIP, ALK, APC*, ATM*, AXIN2, BAP1, BARD1, BLM, BMPR1A, BRCA1*, BRCA2*, BRIP1*, CDC73, CDH1*, CDK4, CDKN1B, CDKN2A, CHEK2*, CTNNA1, DICER1, FANCC, FH, FLCN, GALNT12, KIF1B, LZTR1, MAX, MEN1, MET, MLH1*, MSH2*, MSH3, MSH6*, MUTYH*, NBN, NF1*, NF2, NTHL1, PALB2*, PHOX2B, PMS2*, POT1, PRKAR1A, PTCH1, PTEN*, RAD51C*, RAD51D*, RB1, RECQL, RET, SDHA, SDHAF2, SDHB, SDHC, SDHD, SMAD4, SMARCA4, SMARCB1, SMARCE1, STK11, SUFU, TMEM127, TP53*, TSC1, TSC2, VHL and XRCC2 (sequencing and deletion/duplication); EGFR, EGLN1, HOXB13, KIT, MITF, PDGFRA, POLD1, and POLE (sequencing only); EPCAM and GREM1 (deletion/duplication only). DNA and RNA analyses performed for * genes. The report date is February 19, 2021.   02/27/2021 -  Chemotherapy   Patient is on Treatment Plan : BREAST Pembrolizumab + Carboplatin D1,8,15+ Paclitaxel D1,8,15 q21d X 4 cycles / Pembrolizumab + AC q21d x 4 cycles       CHIEF COMPLIANT: Follow-up for left breast cancer   INTERVAL HISTORY: Denise [name] is a 71 y.o. female here today for follow up of her left breast cancer. Her last visit was on 10/01/2021.   Today she reports feeling good. She is agreeable to mastectomy.   REVIEW OF SYSTEMS:   Review of Systems  Constitutional:  Negative for appetite change and fatigue.   Respiratory:  Positive for shortness of breath.   Gastrointestinal:  Positive for constipation.  All other systems reviewed and are negative.  I have reviewed the past medical history, past surgical history, social history and family history with the patient and they are unchanged from previous note.   ALLERGIES:   is allergic to exforge [amlodipine besylate-valsartan], tape, cheese, levofloxacin in d5w, strawberry extract, tramadol hcl, and yeast-related products.   MEDICATIONS:  Current Outpatient Medications  Medication Sig Dispense Refill   benazepril (LOTENSIN) 20 MG tablet Take by mouth.     Acetaminophen (TYLENOL PO) Take 1 tablet by mouth as needed. (Patient not taking: Reported on 10/02/2021)     albuterol (VENTOLIN HFA) 108 (90 Base) MCG/ACT inhaler Inhale into the lungs.     CARBOPLATIN IV Inject into the vein once a week.     doxepin (SINEQUAN) 10 MG capsule Take 10 mg by mouth at bedtime.     filgrastim (NEUPOGEN) 480 MCG/0.8ML SOSY injection Inject 0.8 mLs (480 mcg total) into the skin daily as needed for up to 8 doses (self-administer subcutaneously as directed by Dr. Delton Coombes as needed for neutropenia). 6.4 mL 0   gabapentin (NEURONTIN) 300 MG capsule Take 1 capsule by mouth at bedtime.     hydrochlorothiazide (MICROZIDE) 12.5 MG capsule Take 12.5 mg by mouth daily.     levothyroxine (SYNTHROID) 75 MCG tablet Take 100 mcg by mouth daily before breakfast.     lidocaine-prilocaine (EMLA) cream Apply a small amount to port a cath site and cover with plastic wrap 1 hour prior to infusion appointments 30 g 3   LORazepam (ATIVAN) 0.5 MG tablet Take 0.5 mg by  mouth daily as needed.     losartan (COZAAR) 50 MG tablet Take 50 mg by mouth daily.     magnesium oxide (MAG-OX) 400 MG tablet Take 1 tablet by mouth daily.     Melatonin 5 MG TABS Take 10 mg by mouth. As needed for sleep     Omega-3 Fatty Acids (FISH OIL PO) Take by mouth.     PACLITAXEL IV Inject into the vein once a  week.     Pembrolizumab (KEYTRUDA IV) Inject into the vein every 21 ( twenty-one) days.     prochlorperazine (COMPAZINE) 10 MG tablet Take 1 tablet (10 mg total) by mouth every 6 (six) hours as needed (Nausea or vomiting). 30 tablet 1   Tiotropium Bromide-Olodaterol (STIOLTO RESPIMAT) 2.5-2.5 MCG/ACT AERS Inhale 1-2 puffs into the lungs as directed.     No current facility-administered medications for this visit.     PHYSICAL EXAMINATION: Performance status (ECOG): 1 - Symptomatic but completely ambulatory  There were no vitals filed for this visit. Wt Readings from Last 3 Encounters:  10/22/21 184 lb 3.2 oz (83.6 kg)  10/01/21 184 lb (83.5 kg)  09/10/21 179 lb 4.8 oz (81.3 kg)   Physical Exam Vitals reviewed.  Constitutional:      Appearance: Normal appearance.  Cardiovascular:     Rate and Rhythm: Normal rate and regular rhythm.     Pulses: Normal pulses.     Heart sounds: Normal heart sounds.  Pulmonary:     Effort: Pulmonary effort is normal.     Breath sounds: Normal breath sounds.  Neurological:     General: No focal deficit present.     Mental Status: She is alert and oriented to person, place, and time.  Psychiatric:        Mood and Affect: Mood normal.        Behavior: Behavior normal.    Breast Exam Chaperone: Thana Ates     LABORATORY DATA:  I have reviewed the data as listed CMP Latest Ref Rng & Units 10/22/2021 10/01/2021 09/10/2021  Glucose 70 - 99 mg/dL 108(H) 97 95  BUN 8 - 23 mg/dL _0 Creatinine 0.44 - 1.00 mg/dL 0.85 0.67 0.72  Sodium 135 - 145 mmol/L 137 139 136  Potassium 3.5 - 5.1 mmol/L 3.4(L) 3.7 3.7  Chloride 98 - 111 mmol/L 106 104 102  CO2 22 - 32 mmol/L _1 Calcium 8.9 - 10.3 mg/dL 8.9 8.8(L) 9.0  Total Protein 6.5 - 8.1 g/dL 6.9 6.8 7.3  Total Bilirubin 0.3 - 1.2 mg/dL 0.5 0.4 0.4  Alkaline Phos 38 - 126 U/L 73 72 87  AST 15 - 41 U/L _2 ALT 0 - 44 U/L _3 No results found for: QUI114 Lab Results   Component Value Date   WBC 3.5 (L) 10/22/2021   HGB 10.2 (L) 10/22/2021   HCT 31.1 (L) 10/22/2021   MCV 102.3 (H) 10/22/2021   PLT 214 10/22/2021   NEUTROABS 1.5 (L) 10/22/2021    ASSESSMENT:  1.  T3N3c (stage IIIc) triple negative invasive lobular carcinoma of the left breast: - She felt lump in her left breast for more than 6 months, but thought it was secondary to fibrocystic disease which she had all her life.  When she started having pain, she reached out to Dr. Sherrie Sport. - She previously had mammogram machine malfunction and had severe pain and traumatized by that experience.  She was having ultrasound  of the breast every other year since then. - She was on hormone replacement therapy for close to 10 years (from age [age]). - Ultrasound of the left breast on 01/08/2021 showed hyper vascular hypoechoic mass at 12 o'clock position measuring 3.8 x 1.3 x 2.5 cm.  There are calcifications located within the mass.  Mass extends to the level of the skin with mild associated skin thickening.  At least 3 morphologically abnormal lymph nodes in the left axilla. - Mammogram showed irregular spiculated mass with associated calcifications in the retroareolar to upper left breast measuring approximately 6 cm.  There are at least 4 morphologically abnormal lymph nodes identified in the left axilla. - Ultrasound-guided left breast and left axillary lymph node biopsy on 01/15/2021 - Pathology consistent with invasive lobular carcinoma, E-cadherin negative.  ER/PR/HER2 negative.  HER2 2+ by IHC, negative by FISH.  Ki-67 is 5%.  Lymph node core biopsy was consistent with metastatic carcinoma.  Grade 2. - PET scan on 02/03/2021 showed involvement of left supraclavicular, subpectoral, axillary lymph nodes along with the breast mass.  10 mm left lung nodule which is hypometabolic.  Spinal cord lesion at T12 level with a strong uptake. - MRI of the lumbar spine with and without contrast on 02/20/2021 showed no mass or  abnormal enhancement within the canal at the T12 level to correspond to the site of PET scan positive.  No marrow replacing bone lesion. - 2D echo on 02/21/2021 with EF 55-60%. - Weekly carboplatin and paclitaxel with every 3 weeks pembrolizumab ([id number]) started on 02/27/2021. - Cycle 1 of AC on 07/04/2021.  Admission to [facility] with neutropenic fever. - Cycle 2 AC with 50% dose reduction on 08/21/2021. - 4 cycles of AC completed on 10/01/2021.   2.  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  She is non-smoker. - Mother died of breast cancer.  Maternal grandmother died very young in her 34s, sister has fibrocystic disease.  Father died of lung cancer and was a smoker.     PLAN:  1.  T3N3c triple negative invasive lobular carcinoma of the left breast: - She has completed 4 cycles of AC on 10/01/2021. - She did not have any major side effects. - We reviewed PET scan from 10/16/2021 which showed reduction in metabolic activity in size of the left axillary lymph node with no evidence of breast hypermetabolism on the PET scan. - We reviewed MRI of the breast from 10/10/2021 which showed 3.9 cm mass and non-mass enhancement in the upper central left breast.  Large left axillary lymph node is 1.3 cm.  Multiple small lymph nodes are present. - She may proceed with left mastectomy and lymph node biopsy.  I will reach out to Dr. Ninfa Linden. - She will come back in 4 weeks for follow-up. - She will need 9 cycles of Keytruda after surgery.  2.  Hypothyroidism: - Continue Synthroid 75 mcg daily.  TSH is 2.1.  3.  Peripheral neuropathy: - Numbness in the toes on and off has been stable.  4.  Hypomagnesemia: - Continue magnesium twice daily.  Magnesium today is 1.9.  Breast Cancer therapy associated bone loss: I have recommended calcium, Vitamin D and weight bearing exercises.  Orders placed this encounter:  No orders of the defined types  were placed in this encounter.   The patient has a good understanding of the overall plan. She agrees with it. She will call with any problems that  may develop before the next visit here.  Derek Jack, MD [place] [phone]   I, Thana Ates, am acting as a scribe for Dr. Derek Jack.  I, Derek Jack MD, have reviewed the above documentation for accuracy and completeness, and I agree with the above.

## 2021-10-22 NOTE — Patient Instructions (Signed)
[place] at [facility] Discharge Instructions   You were seen and examined today by Dr. Delton Coombes.  He discussed the plan of surgery with you.  Mastectomy is likely the course of action here.  He also reviewed the results of your PET scan which are good.  Return as scheduled after surgery to resume treatment with Keytruda.   Thank you for choosing [facility] [place] at [facility] to provide your oncology and hematology care.  To afford each patient quality time with our provider, please arrive at least 15 minutes before your scheduled appointment time.   If you have a lab appointment with the [facility] please come in thru the Main Entrance and check in at the main information desk.  You need to re-schedule your appointment should you arrive 10 or more minutes late.  We strive to give you quality time with our providers, and arriving late affects you and other patients whose appointments are after yours.  Also, if you no show three or more times for appointments you may be dismissed from the clinic at the providers discretion.     Again, thank you for choosing [facility].  Our hope is that these requests will decrease the amount of time that you wait before being seen by our physicians.       _____________________________________________________________  Should you have questions after your visit to [facility], please contact our office at [phone] and follow the prompts.  Our office hours are 8:00 a.m. and 4:30 p.m. Monday - Friday.  Please note that voicemails left after 4:00 p.m. may not be returned until the following business day.  We are closed weekends and major holidays.  You do have access to a nurse 24-7, just call the main number to the clinic [phone] and do not press any options, hold on the line and a nurse will answer the phone.    For prescription refill requests, have your pharmacy contact our  office and allow 72 hours.    Due to Covid, you will need to wear a mask upon entering the hospital. If you do not have a mask, a mask will be given to you at the Main Entrance upon arrival. For doctor visits, patients may have 1 support person age 73 or older with them. For treatment visits, patients can not have anyone with them due to social distancing guidelines and our immunocompromised population.

## 2021-10-22 NOTE — Patient Instructions (Signed)
[facility]  Discharge Instructions: Thank you for choosing [facility] to provide your oncology and hematology care.  If you have a lab appointment with the Cancer Center, please come in thru the Main Entrance and check in at the main information desk.  Wear comfortable clothing and clothing appropriate for easy access to any Portacath or PICC line.   We strive to give you quality time with your provider. You may need to reschedule your appointment if you arrive late (15 or more minutes).  Arriving late affects you and other patients whose appointments are after yours.  Also, if you miss three or more appointments without notifying the office, you may be dismissed from the clinic at the provider's discretion.      For prescription refill requests, have your pharmacy contact our office and allow 72 hours for refills to be completed.    Today your port was flushed and labs were drawn, return as scheduled.   To help prevent nausea and vomiting after your treatment, we encourage you to take your nausea medication as directed.  BELOW ARE SYMPTOMS THAT SHOULD BE REPORTED IMMEDIATELY: *FEVER GREATER THAN 100.4 F (38 C) OR HIGHER *CHILLS OR SWEATING *NAUSEA AND VOMITING THAT IS NOT CONTROLLED WITH YOUR NAUSEA MEDICATION *UNUSUAL SHORTNESS OF BREATH *UNUSUAL BRUISING OR BLEEDING *URINARY PROBLEMS (pain or burning when urinating, or frequent urination) *BOWEL PROBLEMS (unusual diarrhea, constipation, pain near the anus) TENDERNESS IN MOUTH AND THROAT WITH OR WITHOUT PRESENCE OF ULCERS (sore throat, sores in mouth, or a toothache) UNUSUAL RASH, SWELLING OR PAIN  UNUSUAL VAGINAL DISCHARGE OR ITCHING   Items with * indicate a potential emergency and should be followed up as soon as possible or go to the Emergency Department if any problems should occur.  Please show the CHEMOTHERAPY ALERT CARD or IMMUNOTHERAPY ALERT CARD at check-in to the Emergency Department and triage  nurse.  Should you have questions after your visit or need to cancel or reschedule your appointment, please contact [facility] [phone]  and follow the prompts.  Office hours are 8:00 a.m. to 4:30 p.m. Monday - Friday. Please note that voicemails left after 4:00 p.m. may not be returned until the following business day.  We are closed weekends and major holidays. You have access to a nurse at all times for urgent questions. Please call the main number to the clinic [phone] and follow the prompts.  For any non-urgent questions, you may also contact your provider using MyChart. We now offer e-Visits for anyone 18 and older to request care online for non-urgent symptoms. For details visit [email or web address].   Also download the MyChart app! Go to the app store, search "MyChart", open the app, select [facility], and log in with your MyChart username and password.  Due to Covid, a mask is required upon entering the hospital/clinic. If you do not have a mask, one will be given to you upon arrival. For doctor visits, patients may have 1 support person aged 18 or older with them. For treatment visits, patients cannot have anyone with them due to current Covid guidelines and our immunocompromised population.  

## 2021-10-22 NOTE — Progress Notes (Signed)
Patient presents today for port flush and lab draw.  Port flushed with good blood return noted. No bruising or swelling at site. Bandaid applied and patient discharged in satisfactory condition. VVS stable with no signs or symptoms of distressed noted.  °

## 2021-10-24 ENCOUNTER — Other Ambulatory Visit: Payer: Self-pay | Admitting: Surgery

## 2021-10-24 DIAGNOSIS — Z853 Personal history of malignant neoplasm of breast: Secondary | ICD-10-CM

## 2021-10-27 ENCOUNTER — Other Ambulatory Visit: Payer: Self-pay | Admitting: Surgery

## 2021-10-27 DIAGNOSIS — Z853 Personal history of malignant neoplasm of breast: Secondary | ICD-10-CM

## 2021-11-03 DIAGNOSIS — I700 Atherosclerosis of aorta: Secondary | ICD-10-CM | POA: Diagnosis not present

## 2021-11-03 DIAGNOSIS — E038 Other specified hypothyroidism: Secondary | ICD-10-CM | POA: Diagnosis not present

## 2021-11-03 DIAGNOSIS — Z1331 Encounter for screening for depression: Secondary | ICD-10-CM | POA: Diagnosis not present

## 2021-11-03 DIAGNOSIS — Z0000 Encounter for general adult medical examination without abnormal findings: Secondary | ICD-10-CM | POA: Diagnosis not present

## 2021-11-03 DIAGNOSIS — D703 Neutropenia due to infection: Secondary | ICD-10-CM | POA: Diagnosis not present

## 2021-11-03 DIAGNOSIS — L299 Pruritus, unspecified: Secondary | ICD-10-CM | POA: Diagnosis not present

## 2021-11-03 DIAGNOSIS — E785 Hyperlipidemia, unspecified: Secondary | ICD-10-CM | POA: Diagnosis not present

## 2021-11-03 DIAGNOSIS — I10 Essential (primary) hypertension: Secondary | ICD-10-CM | POA: Diagnosis not present

## 2021-11-03 DIAGNOSIS — J45909 Unspecified asthma, uncomplicated: Secondary | ICD-10-CM | POA: Diagnosis not present

## 2021-11-12 ENCOUNTER — Encounter (HOSPITAL_BASED_OUTPATIENT_CLINIC_OR_DEPARTMENT_OTHER): Payer: Self-pay | Admitting: Surgery

## 2021-11-12 ENCOUNTER — Other Ambulatory Visit: Payer: Self-pay

## 2021-11-12 NOTE — Progress Notes (Signed)
Patient with history of SOB, slow to improve since admitted in 06/2021.  Has new inhalers, denies any requirement of O2. Will have anesthesia consult with lab prior to surgery scheduled at [facility].  ?

## 2021-11-17 DIAGNOSIS — Z20822 Contact with and (suspected) exposure to covid-19: Secondary | ICD-10-CM | POA: Diagnosis not present

## 2021-11-18 ENCOUNTER — Encounter (HOSPITAL_BASED_OUTPATIENT_CLINIC_OR_DEPARTMENT_OTHER)
Admission: RE | Admit: 2021-11-18 | Discharge: 2021-11-18 | Disposition: A | Discharge status: Home / Self Care | Payer: Medicare Other | Source: Ambulatory Visit | Attending: Surgery | Admitting: Surgery

## 2021-11-18 DIAGNOSIS — Z01812 Encounter for preprocedural laboratory examination: Secondary | ICD-10-CM | POA: Diagnosis not present

## 2021-11-18 DIAGNOSIS — C50912 Malignant neoplasm of unspecified site of left female breast: Secondary | ICD-10-CM | POA: Insufficient documentation

## 2021-11-18 LAB — BASIC METABOLIC PANEL
Anion gap: 7 (ref 5–15)
BUN: 16 mg/dL (ref 8–23)
CO2: 26 mmol/L (ref 22–32)
Calcium: 9.3 mg/dL (ref 8.9–10.3)
Chloride: 104 mmol/L (ref 98–111)
Creatinine, Ser: 0.86 mg/dL (ref 0.44–1.00)
GFR, Estimated: >60 mL/min (ref >60)
Glucose, Bld: 84 mg/dL (ref 70–99)
Potassium: 3.8 mmol/L (ref 3.5–5.1)
Sodium: 137 mmol/L (ref 135–145)

## 2021-11-18 MED ORDER — ENSURE PRE-SURGERY PO LIQD
296.0000 mL | Freq: Once | ORAL | Status: DC
Start: 2021-11-19 — End: 2021-11-20

## 2021-11-18 NOTE — Anesthesia Preprocedure Evaluation (Addendum)
Anesthesia Evaluation  ?Patient identified by MRN, date of birth, ID band ?Patient awake ? ? ? ?Reviewed: ?Allergy & Precautions, NPO status , Patient's Chart, lab work & pertinent test results ? ?History of Anesthesia Complications ?(+) PONV and history of anesthetic complications ? ?Airway ?Mallampati: II ? ?TM Distance: >3 FB ?Neck ROM: Full ? ? ? Dental ? ?(+) Edentulous Upper, Edentulous Lower ?  ?Pulmonary ?asthma (childhood) ,  ?  ?Pulmonary exam normal ?breath sounds clear to auscultation ? ? ? ? ? ? Cardiovascular ?hypertension, Pt. on medications ?Normal cardiovascular exam ?Rhythm:Regular Rate:Normal ? ? ?'22 TTE - EF 55 to 60%. Mild left ventricular hypertrophy. Trivial mitral valve regurgitation.  ? ?  ?Neuro/Psych ?negative neurological ROS ? negative psych ROS  ? GI/Hepatic ?negative GI ROS, Neg liver ROS,   ?Endo/Other  ?Hypothyroidism  ? Renal/GU ?negative Renal ROS  ? ?  ?Musculoskeletal ?negative musculoskeletal ROS ?(+)  ? Abdominal ?  ?Peds ?negative pediatric ROS ?(+)  Hematology ? ?Hx acquired pancytopenia ?   ?Anesthesia Other Findings ? ? Reproductive/Obstetrics ? ?Breast cancer ? ? ?  ? ? ? ? ? ? ? ? ? ? ? ? ? ?  ?  ? ? ? ? ? ?Anesthesia Physical ?Anesthesia Plan ? ?ASA: 3 ? ?Anesthesia Plan: General and Regional  ? ?Post-op Pain Management: Regional block* and Tylenol PO (pre-op)*  ? ?Induction: Intravenous ? ?PONV Risk Score and Plan: Treatment may vary due to age or medical condition, Midazolam, Ondansetron and Dexamethasone ? ?Airway Management Planned: LMA ? ?Additional Equipment: None ? ?Intra-op Plan:  ? ?Post-operative Plan: Extubation in OR ? ?Informed Consent: I have reviewed the patients History and Physical, chart, labs and discussed the procedure including the risks, benefits and alternatives for the proposed anesthesia with the patient or authorized representative who has indicated his/her understanding and acceptance.  ? ? ? ?Dental advisory  given ? ?Plan Discussed with: Anesthesiologist ? ?Anesthesia Plan Comments: (Pec block. GA/LMA. Norton Blizzard, MD  ? ?-------------------------------------------------------------------------------------------------------------------------------------------------- ? ?Hx of "getting shocked" during a hysterectomy - story unclear, procedure 20+ years ago in [place], was told after the surgery that she had to be shocked. Unclear if CPR performed, maybe shocked for an arrhythmia. Did not end up with any prolonged hospital stay, cardiac workup (including <1 yr ago for chemotherapy evaluation) without any significant findings. ?More recent hx of admission for SOB/fatigue in October. Came to ED, admitted for neutropenic fever, left same day AMA as she said she felt the medical team told her they couldn't do anything to save her life and she was unhappy with the care. She left that hospital and was briefly admitted at another facility for the same. Since then, has had intermittent SOB with exertion, which she says is most prominent in cold weather. No further hospitalizations. She has been using inhalers infrequently, but does note that her SOB resolves when she does feel a need to use them. Not on any oxygen, 96% SaO2 on RA during preop visit. >4 METS capable. Instructed to bring inhalers DOS.  Dr. Fransisco Beau, MD)  ? ? ? ? ? ?Anesthesia Quick Evaluation ? ?

## 2021-11-18 NOTE — Progress Notes (Signed)
Anesthesia consult complete. Dr. Fransisco Beau ok to proceed with her scheduled procedure here at [facility]. No further action needed at this time. ?

## 2021-11-18 NOTE — Progress Notes (Signed)

## 2021-11-19 ENCOUNTER — Ambulatory Visit
Admission: RE | Admit: 2021-11-19 | Discharge: 2021-11-19 | Disposition: A | Discharge status: Home / Self Care | Payer: Medicare Other | Source: Ambulatory Visit | Attending: Surgery | Admitting: Surgery

## 2021-11-19 ENCOUNTER — Other Ambulatory Visit: Payer: Self-pay | Admitting: Surgery

## 2021-11-19 ENCOUNTER — Other Ambulatory Visit: Payer: Self-pay

## 2021-11-19 DIAGNOSIS — R922 Inconclusive mammogram: Secondary | ICD-10-CM | POA: Diagnosis not present

## 2021-11-19 DIAGNOSIS — R590 Localized enlarged lymph nodes: Secondary | ICD-10-CM | POA: Diagnosis not present

## 2021-11-19 DIAGNOSIS — Z853 Personal history of malignant neoplasm of breast: Secondary | ICD-10-CM

## 2021-11-19 IMAGING — US US PLC BREAST LOC DEV 1ST LESION INC US GUIDE*L*
1 series · 4 of 4 positions shown · non-contrast
Comparison: Previous exam(s).

CLINICAL DATA: 70-year-old female presenting for radioactive seed
localization prior to targeted lymph node dissection.

EXAM:
ULTRASOUND GUIDED RADIOACTIVE SEED LOCALIZATION OF THE LEFT AXILLA

[Series 1: us plc breast loc dev 1st lesion inc us guide*left · 0.07mm/px · 4 of 4 slices shown]
[im 1/4]
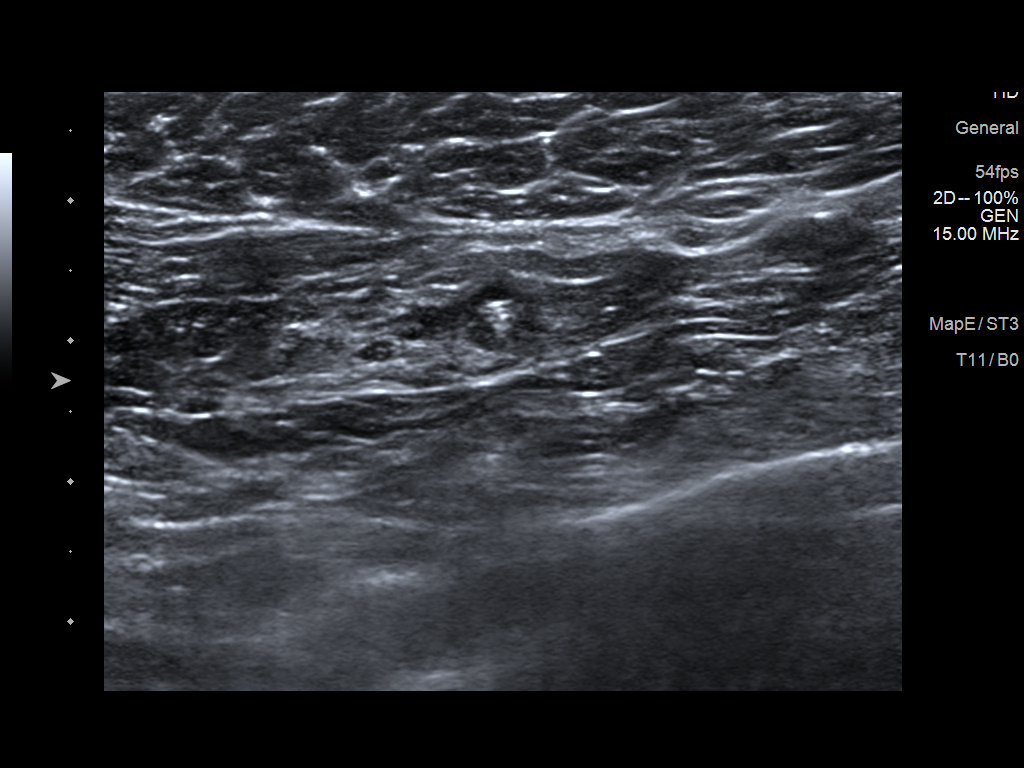
[im 2/4]
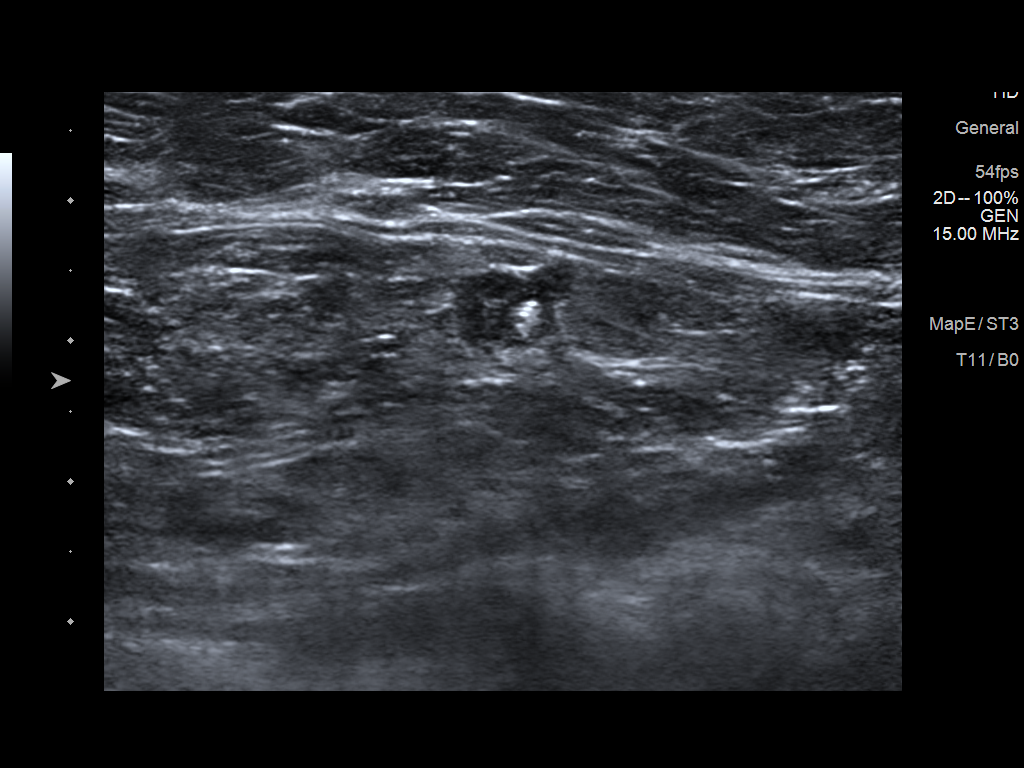
[im 3/4]
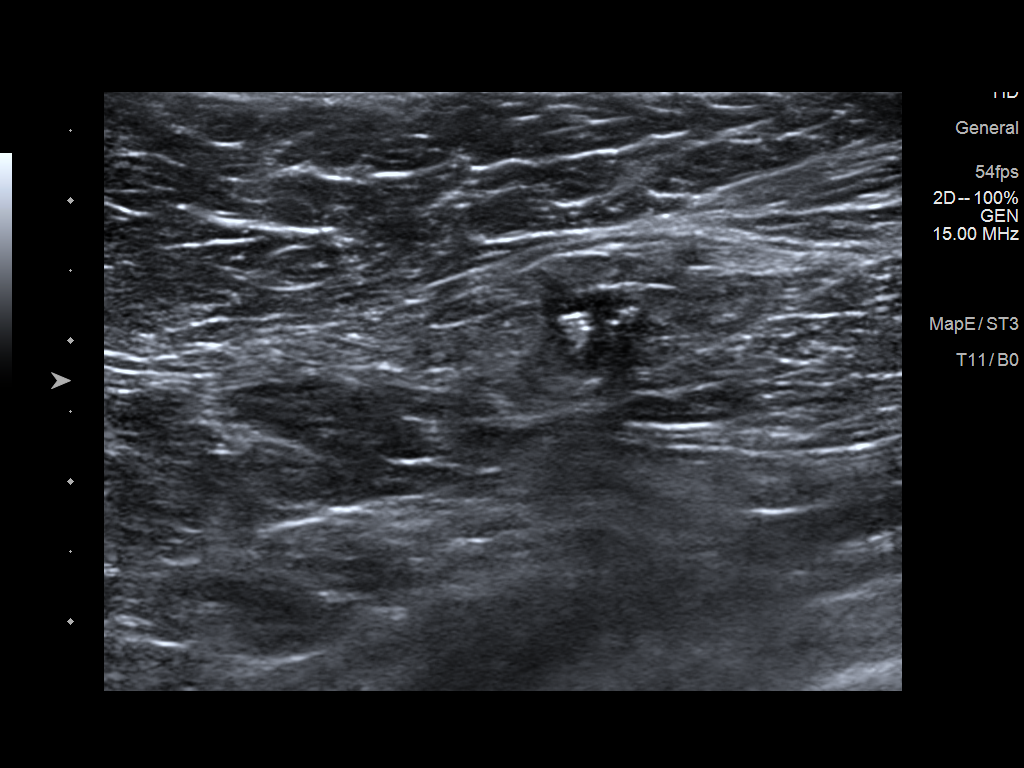
[im 4/4]
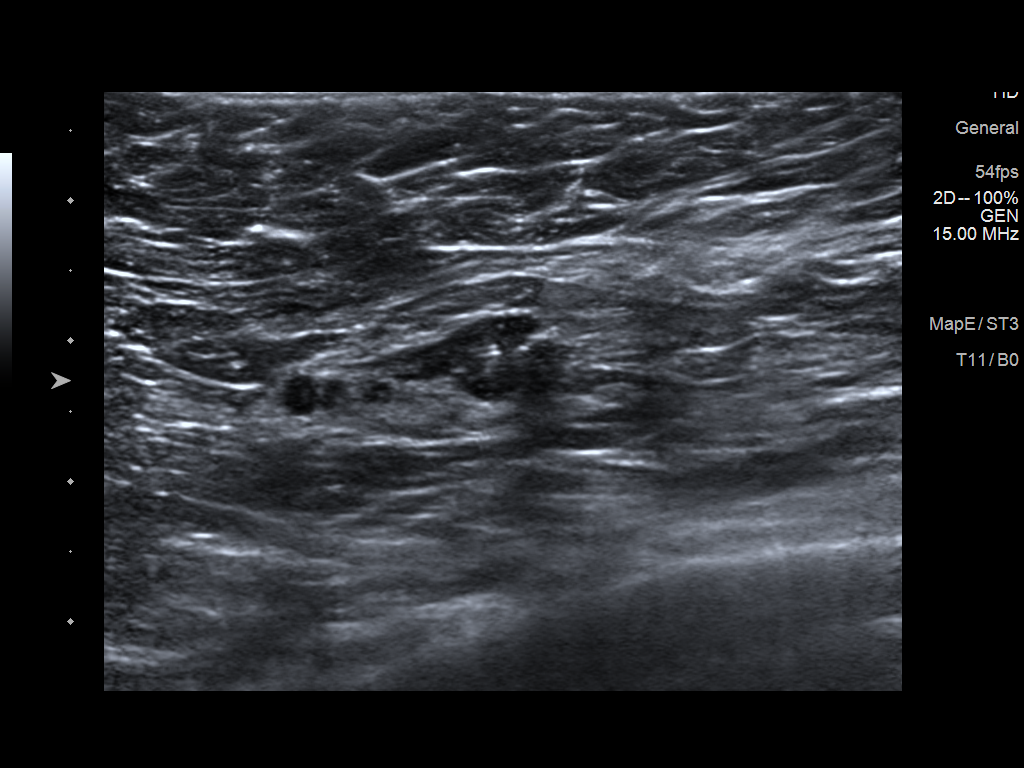

[4 of 4 positions shown; findings below may reference images not displayed]

FINDINGS: Patient presents for radioactive seed localization prior to targeted
lymph node dissection. I met with the patient and we discussed the
procedure of seed localization including benefits and alternatives.
We discussed the high likelihood of a successful procedure. We
discussed the risks of the procedure including infection, bleeding,
tissue injury and further surgery. We discussed the low dose of
radioactivity involved in the procedure. Informed, written consent
was given.

The usual time-out protocol was performed immediately prior to the
procedure.

Using ultrasound guidance, sterile technique, 1% lidocaine and an
[1S] radioactive seed, the left axillary lymph node with a
radioactive seed was localized using an inferior approach. The
follow-up mammogram images were acquired to confirm the seed in the
expected location and were marked for Dr. BARTELS.

Follow-up survey of the patient confirms presence of the radioactive
seed.

Order number of [1S] seed:  [PHONE_NUMBER].

Total activity:  0.251 millicuries reference Date: [DATE]

The patient tolerated the procedure well and was released from the
[REDACTED]. She was given instructions regarding seed removal.
IMPRESSION: Radioactive seed localization of the left axilla no apparent
complications.

## 2021-11-19 IMAGING — MG MM BREAST LOCALIZATION CLIP
2 series · 3 of 6 positions shown · non-contrast
Comparison: Previous exam(s).

CLINICAL DATA: Mammogram status post radioactive seed placement
within a left axillary lymph node.

EXAM:
DIAGNOSTIC LEFT MAMMOGRAM POST ULTRASOUND-GUIDED RADIOACTIVE SEED
PLACEMENT

[L MLO synth-2D]
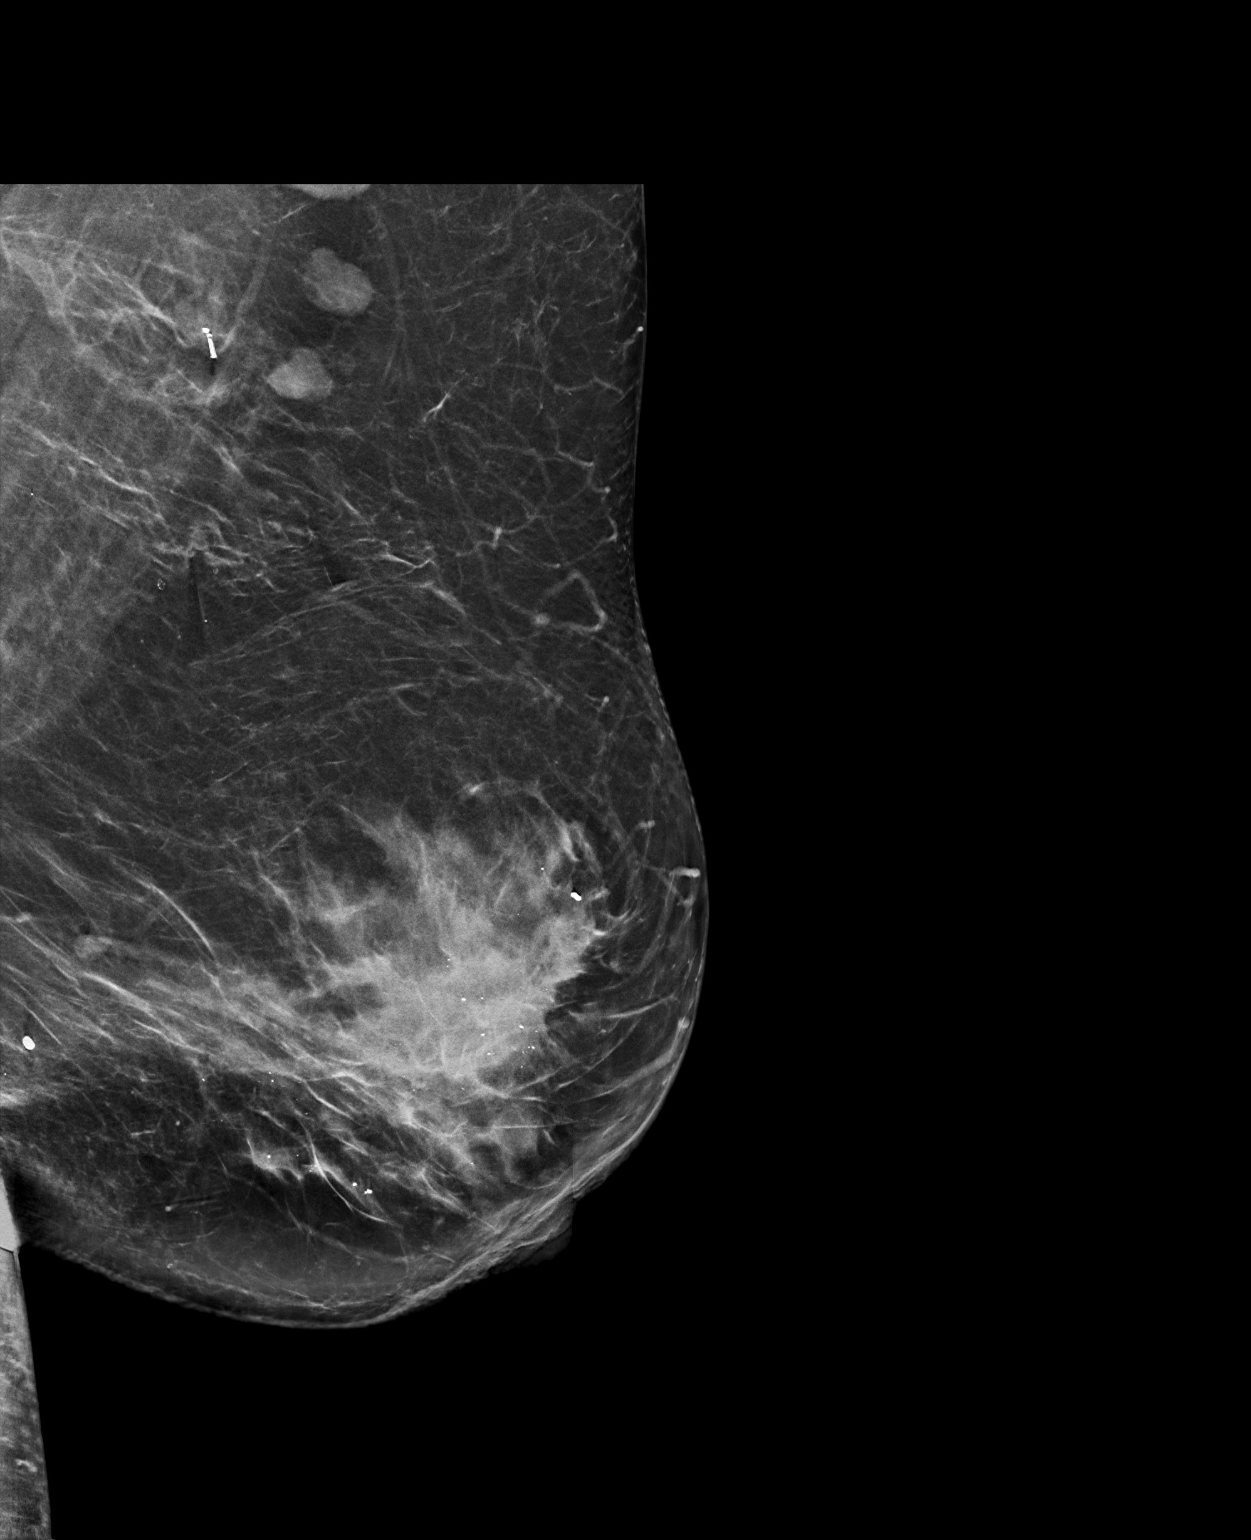

[L MLO tomo · 2 of 81 frames shown]
[frame 27/81]
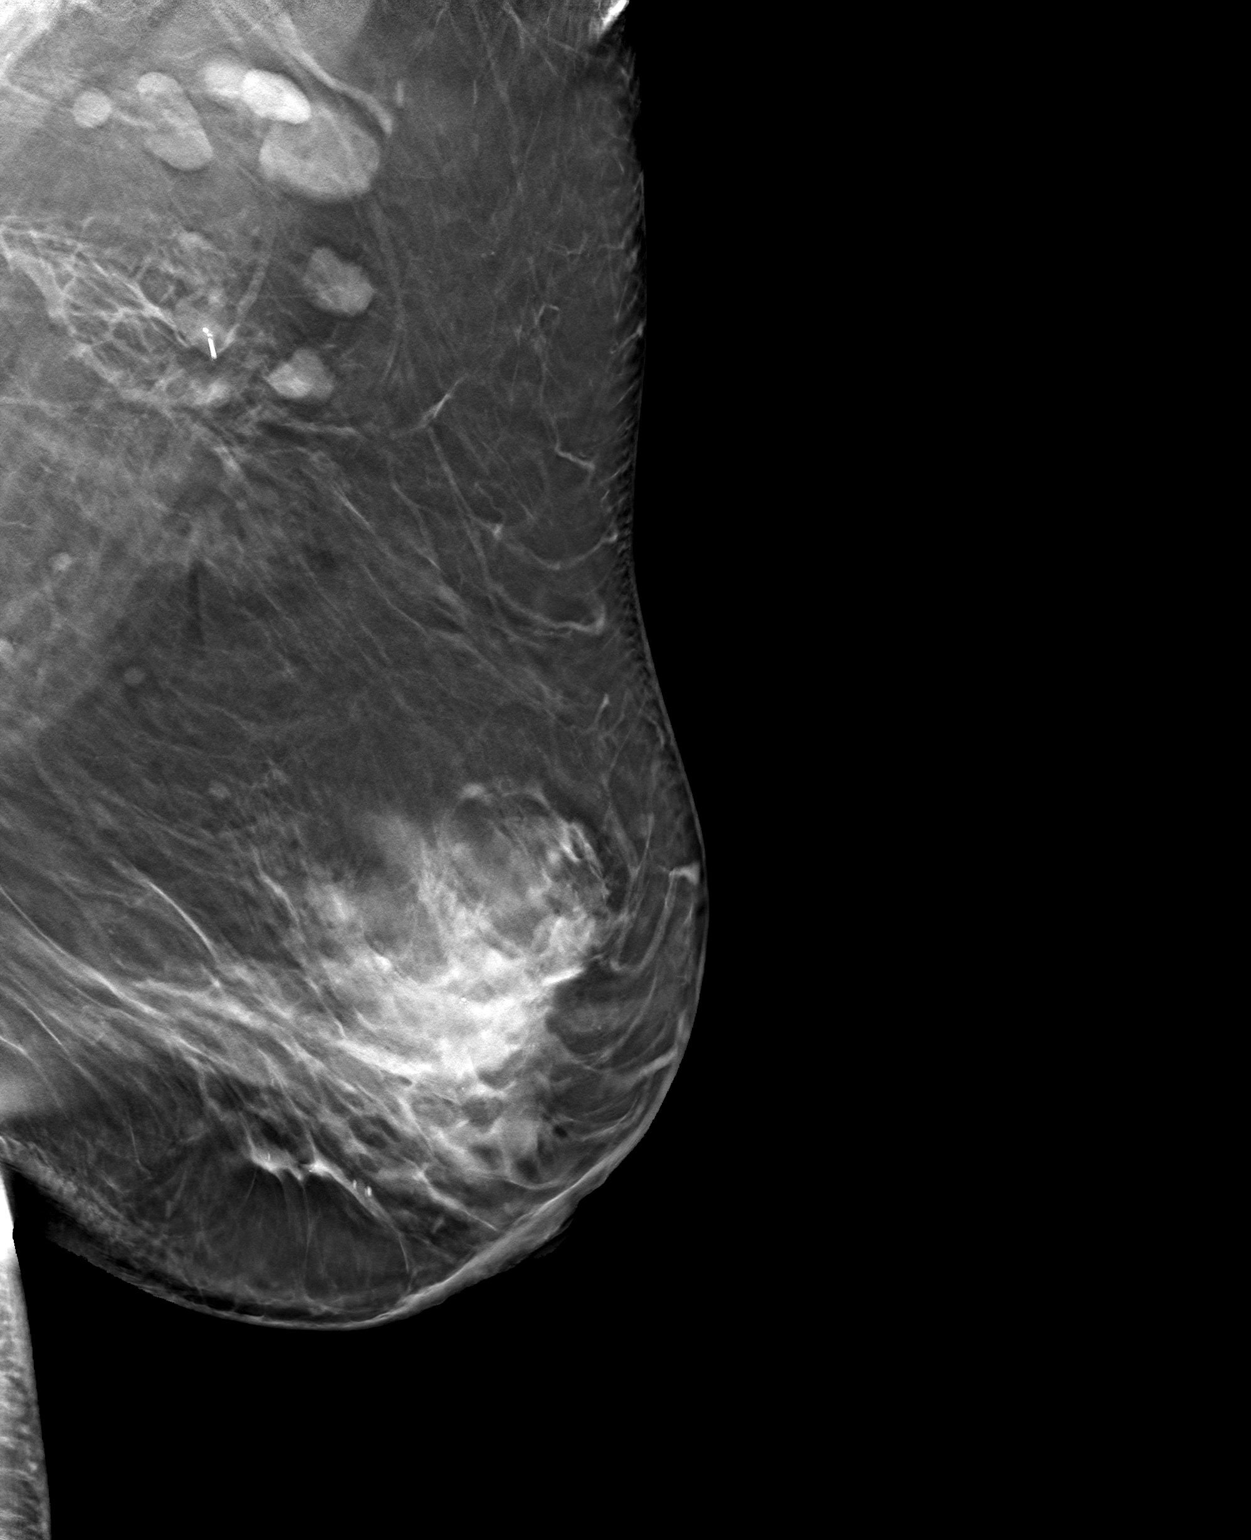
[frame 41/81]
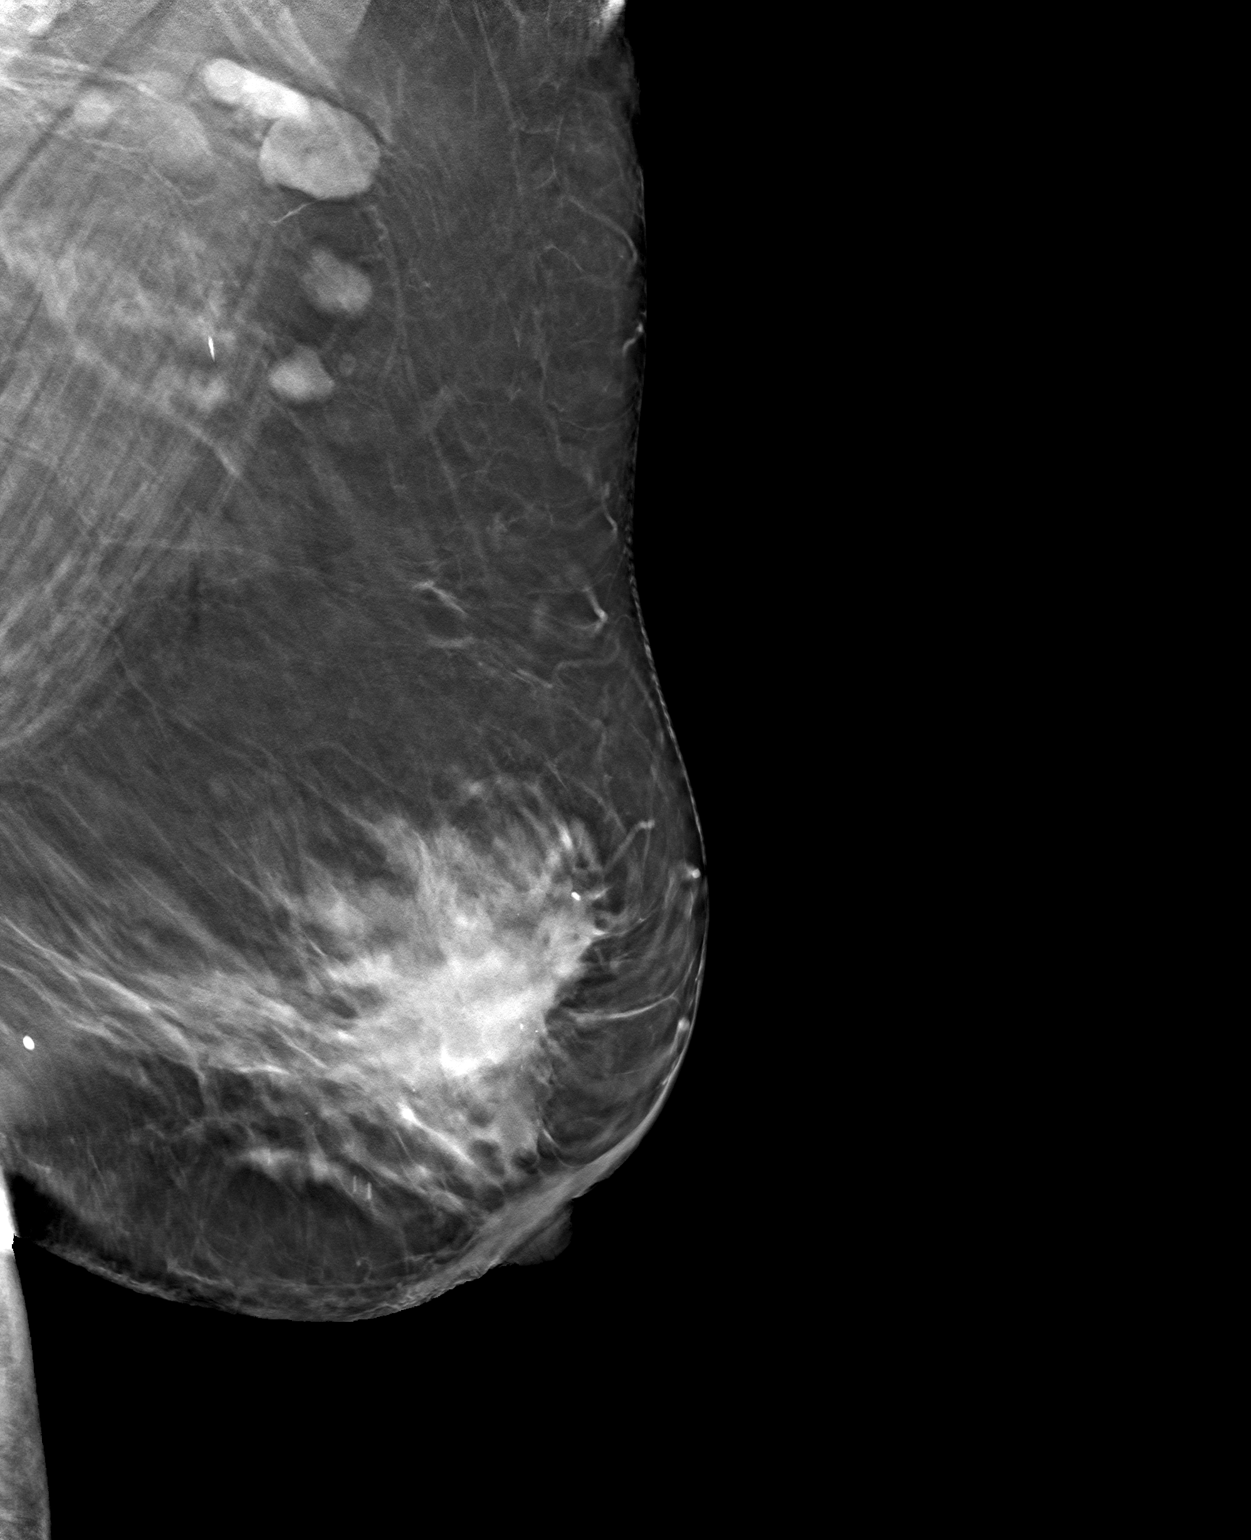

[3 of 6 positions shown; findings below may reference images not displayed]

FINDINGS: Mammographic images were obtained following ultrasound-guided
radioactive seed placement. These demonstrate the radioactive seed
is immediately adjacent to the biopsy marking clip in a left
axillary lymph node.
IMPRESSION: Appropriate location of the radioactive seed.

Final Assessment: Post Procedure Mammograms for Seed Placement

## 2021-11-19 NOTE — H&P (Signed)
? ?PROVIDER: Beverlee Nims, MD ? ?MRN: [id number] ?DOB: June 02, 1951 ? ?Subjective  ? ?Chief Complaint: Follow-up (L breast cancer) ? ? ?History of Present Illness: ?Denise [name] is a 71 y.o. female who is seen today for for a long-term follow-up regarding her stage III left breast invasive lobular carcinoma. I last saw her in May of last year. She has been undergoing chemotherapy in [place] and is just finished her last dose. She is followed closely by medical oncology there. She is scheduled to have a follow-up MRI and PET scan on 1/27. She reports has been doing very well. Her Port-A-Cath has been working well.. ? ? ? ?Review of Systems: ?A complete review of systems was obtained from the patient. I have reviewed this information and discussed as appropriate with the patient. See HPI as well for other ROS. ? ?ROS  ? ?Medical History: ?Past Medical History:  ?Diagnosis Date  ? Asthma, unspecified asthma severity, unspecified whether complicated, unspecified whether persistent  ? History of cancer  ? Hyperlipidemia  ? Hypertension  ? Thyroid disease  ? ?There is no problem list on file for this patient. ? ?Past Surgical History:  ?Procedure Laterality Date  ? CHOLECYSTECTOMY  ? HYSTERECTOMY  ? ? ?Allergies  ?Allergen Reactions  ? Adhesive Tape-Silicones Other (See Comments)  ?Redness and Irriatation  ? Levofloxacin In D5w Hives  ? Sulfa (Sulfonamide Antibiotics) Hives  ? Tramadol Hcl Nausea And Vomiting  ? Yeast Hives  ?bread  ? ?Current Outpatient Medications on File Prior to Visit  ?Medication Sig Dispense Refill  ? albuterol 90 mcg/actuation inhaler Inhale into the lungs every 6 (six) hours as needed  ? benazepriL (LOTENSIN) 20 MG tablet Take 20 mg by mouth once daily  ? doxepin (SINEQUAN) 10 MG capsule Take 10 mg by mouth at bedtime  ? filgrastim (NEUPOGEN) 480 mcg/0.8 mL injection syringe Inject subcutaneously  ? gabapentin (NEURONTIN) 300 MG capsule Take 1 capsule by mouth at bedtime  ?  hydroCHLOROthiazide (MICROZIDE) 12.5 mg capsule Take by mouth  ? LORazepam (ATIVAN) 0.5 MG tablet Take by mouth  ? losartan (COZAAR) 50 MG tablet Take 50 mg by mouth once daily  ? magnesium oxide (MAG-OX) 400 mg (241.3 mg magnesium) tablet Take 1 tablet by mouth once daily  ? aspirin 81 MG chewable tablet Take 81 mg by mouth once daily  ? CARBOplatin (PARAPLATIN) 150 mg injection Inject into the vein  ? levothyroxine (SYNTHROID) 75 MCG tablet Take by mouth  ? omega-3-dha-epa-fish oil 300-1,000 mg capsule Take by mouth  ? pembrolizumab (KEYTRUDA) 25 mg/mL Soln chemo injection Inject into the vein  ? tiotropium-olodateroL (STIOLTO RESPIMAT) 2.5-2.5 mcg/actuation inhaler Inhale into the lungs  ? ?No current facility-administered medications on file prior to visit.  ? ?Family History  ?Problem Relation Age of Onset  ? High blood pressure (Hypertension) Mother  ? Hyperlipidemia (Elevated cholesterol) Mother  ? Coronary Artery Disease (Blocked arteries around heart) Mother  ? Breast cancer Mother  ? ? ?Social History  ? ?Tobacco Use  ?Smoking Status Never  ?Smokeless Tobacco Never  ? ? ?Social History  ? ?Socioeconomic History  ? Marital status: Single  ?Tobacco Use  ? Smoking status: Never  ? Smokeless tobacco: Never  ?Vaping Use  ? Vaping Use: Never used  ?Substance and Sexual Activity  ? Alcohol use: Not Currently  ? Drug use: Never  ? ?Objective:  ? ?Vitals:  ? ?Pulse: 87  ?SpO2: (!) 88%  ?Weight: 84.2 kg (185 lb 9.6 oz)  ?  Height: 165.1 cm ('5\' 5"'$ )  ? ?Body mass index is 30.89 kg/m?. ? ?Physical Exam  ? ?She looks well on exam. ? ?On breast exam, the mass at the 12 o'clock position of the left breast is very difficult to palpate now. The nipple areolar complex appears normal now. ? ?There is no palpable adenopathy ? ?Labs, Imaging and Diagnostic Testing: ? ?Assessment and Plan:  ? ?Diagnoses and all orders for this visit: ? ?Invasive lobular carcinoma of breast, stage 3, left (CMS-HCC) ? ? ? ?I reviewed her notes from  her last visit the medical oncologist. I will have to review her MRI and PET scan to then determine whether or not we can proceed with a central lumpectomy which may need to incorporate the nipple areolar complex and sentinel node biopsy versus a mastectomy. I will call her back since I reviewed the films and will discuss the surgical plans. She is in current agreement with our plans.  ? ?Addendum: The MRI showed minimal reduction in the size of the malignancy therefore a left mastectomy is recommended.  We also plan a targeted lymph node dissection.  I have discussed this with her in detail.  She does not want immediate reconstruction.  We discussed the risk of surgery which include but is not limited to bleeding, infection, injury to surrounding structures, the need for drain placement, the need for further procedures, cardiopulmonary issues, seroma formation, etc.  Surgery is scheduled ?

## 2021-11-20 ENCOUNTER — Encounter (HOSPITAL_BASED_OUTPATIENT_CLINIC_OR_DEPARTMENT_OTHER)
Admission: RE | Disposition: A | Discharge status: Home / Self Care | Payer: Self-pay | Source: Home / Self Care | Attending: Surgery

## 2021-11-20 ENCOUNTER — Other Ambulatory Visit: Payer: Self-pay

## 2021-11-20 ENCOUNTER — Encounter (HOSPITAL_BASED_OUTPATIENT_CLINIC_OR_DEPARTMENT_OTHER): Payer: Self-pay | Admitting: Surgery

## 2021-11-20 ENCOUNTER — Ambulatory Visit
Admission: RE | Admit: 2021-11-20 | Discharge: 2021-11-20 | Disposition: A | Discharge status: Home / Self Care | Payer: Medicare Other | Source: Ambulatory Visit | Attending: Surgery | Admitting: Surgery

## 2021-11-20 ENCOUNTER — Ambulatory Visit (HOSPITAL_BASED_OUTPATIENT_CLINIC_OR_DEPARTMENT_OTHER): Payer: Medicare Other | Admitting: Anesthesiology

## 2021-11-20 ENCOUNTER — Observation Stay (HOSPITAL_BASED_OUTPATIENT_CLINIC_OR_DEPARTMENT_OTHER)
Admission: RE | Admit: 2021-11-20 | Discharge: 2021-11-21 | Disposition: A | Discharge status: Home / Self Care | Payer: Medicare Other | Attending: Surgery | Admitting: Surgery

## 2021-11-20 DIAGNOSIS — I10 Essential (primary) hypertension: Secondary | ICD-10-CM | POA: Diagnosis not present

## 2021-11-20 DIAGNOSIS — D0502 Lobular carcinoma in situ of left breast: Principal | ICD-10-CM | POA: Insufficient documentation

## 2021-11-20 DIAGNOSIS — E039 Hypothyroidism, unspecified: Secondary | ICD-10-CM | POA: Diagnosis not present

## 2021-11-20 DIAGNOSIS — G8918 Other acute postprocedural pain: Secondary | ICD-10-CM | POA: Diagnosis not present

## 2021-11-20 DIAGNOSIS — C50912 Malignant neoplasm of unspecified site of left female breast: Secondary | ICD-10-CM | POA: Diagnosis not present

## 2021-11-20 DIAGNOSIS — J45909 Unspecified asthma, uncomplicated: Secondary | ICD-10-CM | POA: Insufficient documentation

## 2021-11-20 DIAGNOSIS — Z803 Family history of malignant neoplasm of breast: Secondary | ICD-10-CM

## 2021-11-20 DIAGNOSIS — Z171 Estrogen receptor negative status [ER-]: Secondary | ICD-10-CM | POA: Diagnosis not present

## 2021-11-20 DIAGNOSIS — Z79899 Other long term (current) drug therapy: Secondary | ICD-10-CM | POA: Diagnosis not present

## 2021-11-20 DIAGNOSIS — Z7982 Long term (current) use of aspirin: Secondary | ICD-10-CM | POA: Insufficient documentation

## 2021-11-20 DIAGNOSIS — Z853 Personal history of malignant neoplasm of breast: Secondary | ICD-10-CM

## 2021-11-20 DIAGNOSIS — R928 Other abnormal and inconclusive findings on diagnostic imaging of breast: Secondary | ICD-10-CM | POA: Diagnosis not present

## 2021-11-20 DIAGNOSIS — I340 Nonrheumatic mitral (valve) insufficiency: Secondary | ICD-10-CM

## 2021-11-20 DIAGNOSIS — C773 Secondary and unspecified malignant neoplasm of axilla and upper limb lymph nodes: Secondary | ICD-10-CM | POA: Insufficient documentation

## 2021-11-20 DIAGNOSIS — Z9012 Acquired absence of left breast and nipple: Secondary | ICD-10-CM

## 2021-11-20 HISTORY — DX: Dyspnea, unspecified: R06.00

## 2021-11-20 HISTORY — PX: RADIOACTIVE SEED GUIDED AXILLARY SENTINEL LYMPH NODE: SHX6735

## 2021-11-20 HISTORY — PX: TOTAL MASTECTOMY: SHX6129

## 2021-11-20 IMAGING — DX MM BREAST SURGICAL SPECIMEN
1 series · 2 of 2 positions shown · non-contrast
Comparison: Previous exam(s).

CLINICAL DATA: Evaluate specimen

EXAM:
SPECIMEN RADIOGRAPH OF THE LEFT BREAST

[Series 1: specimen digital x-ray · left · 0.07mm/px · 2 of 2 slices shown]
[im 1/2]
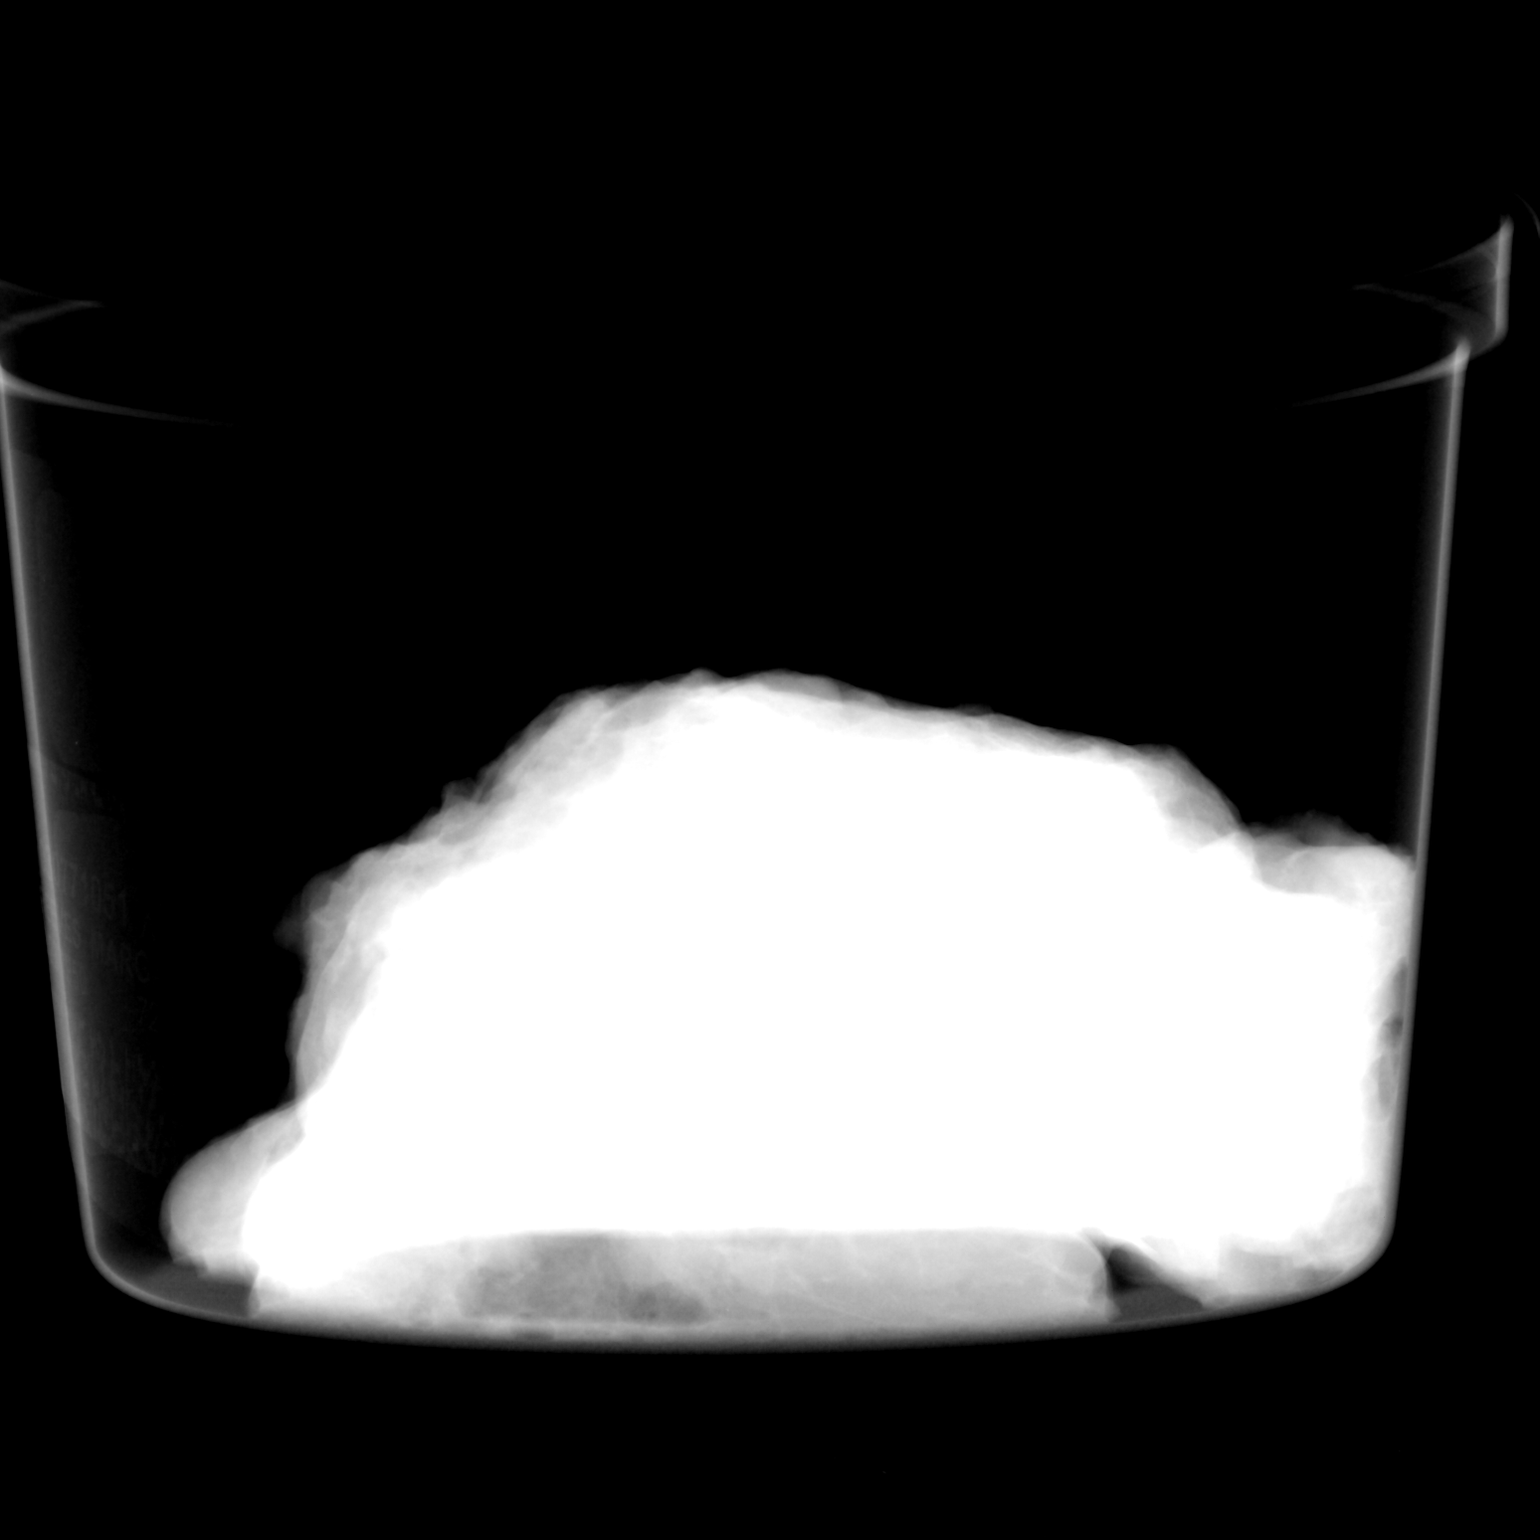
[im 2/2]
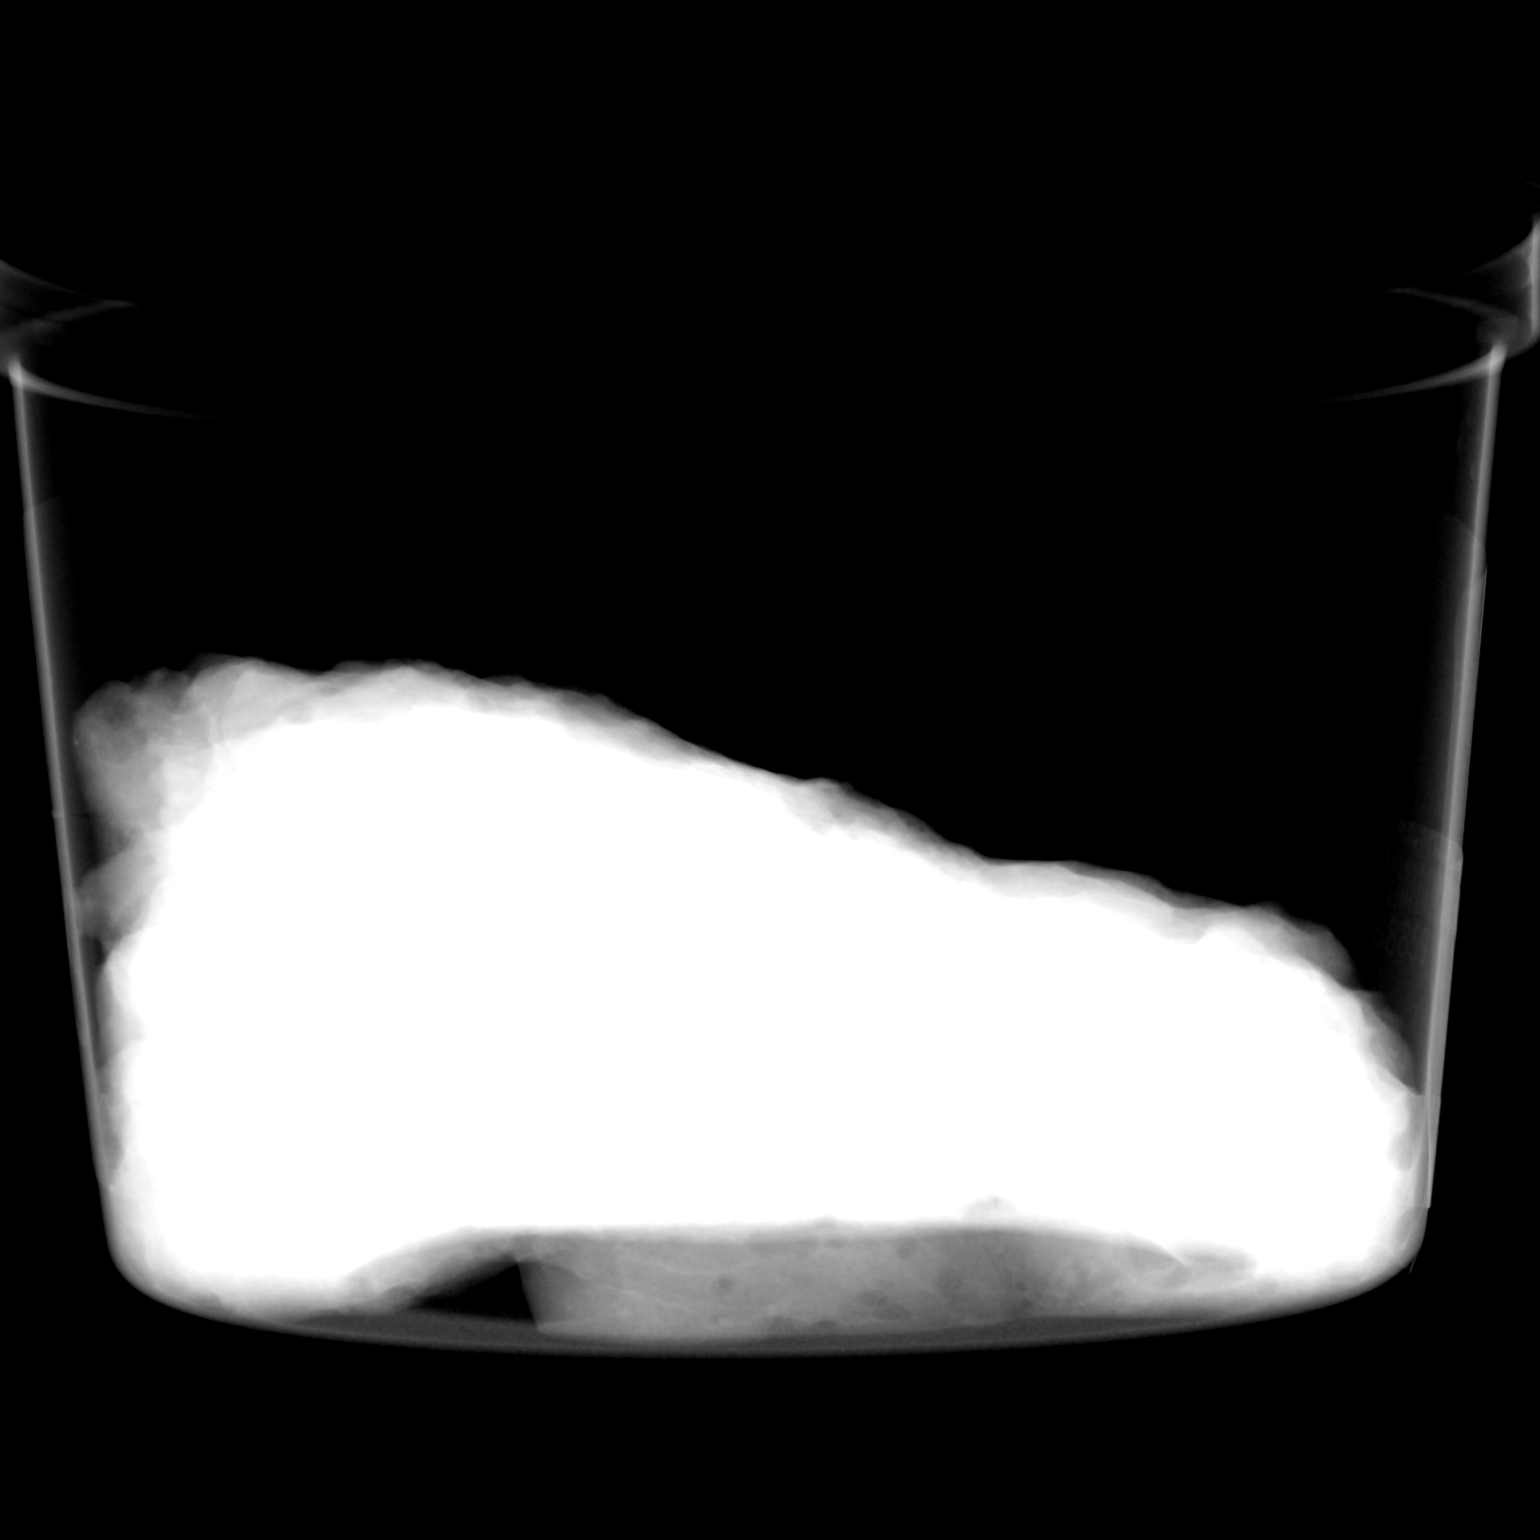

[2 of 2 positions shown; findings below may reference images not displayed]

FINDINGS: Status post excision of the left breast. The radioactive seed and
biopsy marker clip are present, completely intact, and were marked
for pathology.
IMPRESSION: Specimen radiograph of the left breast.

## 2021-11-20 SURGERY — MASTECTOMY, SIMPLE
Anesthesia: Regional | Site: Breast | Laterality: Left

## 2021-11-20 MED ORDER — BUPIVACAINE LIPOSOME 1.3 % IJ SUSP
INTRAMUSCULAR | Status: DC | PRN
  Administered 2021-11-20: 10 mL

## 2021-11-20 MED ORDER — SODIUM CHLORIDE 0.9 % IV SOLN: INTRAVENOUS | Status: DC

## 2021-11-20 MED ORDER — FENTANYL CITRATE (PF) 100 MCG/2ML IJ SOLN
INTRAMUSCULAR | Status: AC
  Filled 2021-11-20: qty 2

## 2021-11-20 MED ORDER — ACETAMINOPHEN 500 MG PO TABS
1000.0000 mg | ORAL_TABLET | Freq: Four times a day (QID) | ORAL | Status: DC
  Administered 2021-11-20 – 2021-11-21 (×2): 1000 mg via ORAL
  Filled 2021-11-20 (×2): qty 2

## 2021-11-20 MED ORDER — FENTANYL CITRATE (PF) 100 MCG/2ML IJ SOLN
50.0000 ug | Freq: Once | INTRAMUSCULAR | Status: AC
  Administered 2021-11-20: 11:00:00 50 ug via INTRAVENOUS

## 2021-11-20 MED ORDER — ONDANSETRON HCL 4 MG/2ML IJ SOLN
INTRAMUSCULAR | Status: AC
  Filled 2021-11-20: qty 2

## 2021-11-20 MED ORDER — ENOXAPARIN SODIUM 40 MG/0.4ML IJ SOSY: 40.0000 mg | PREFILLED_SYRINGE | INTRAMUSCULAR | Status: DC

## 2021-11-20 MED ORDER — BENAZEPRIL HCL 20 MG PO TABS: 20.0000 mg | ORAL_TABLET | Freq: Every day | ORAL | Status: DC

## 2021-11-20 MED ORDER — FENTANYL CITRATE (PF) 100 MCG/2ML IJ SOLN
INTRAMUSCULAR | Status: DC | PRN
  Administered 2021-11-20: 25 ug via INTRAVENOUS
  Administered 2021-11-20: 50 ug via INTRAVENOUS
  Administered 2021-11-20: 25 ug via INTRAVENOUS
  Administered 2021-11-20: 50 ug via INTRAVENOUS

## 2021-11-20 MED ORDER — 0.9 % SODIUM CHLORIDE (POUR BTL) OPTIME
TOPICAL | Status: DC | PRN
  Administered 2021-11-20: 14:00:00 800 mL

## 2021-11-20 MED ORDER — CHLORHEXIDINE GLUCONATE CLOTH 2 % EX PADS: 6.0000 | MEDICATED_PAD | Freq: Once | CUTANEOUS | Status: DC

## 2021-11-20 MED ORDER — LACTATED RINGERS IV SOLN: INTRAVENOUS | Status: DC

## 2021-11-20 MED ORDER — ONDANSETRON HCL 4 MG/2ML IJ SOLN
4.0000 mg | Freq: Four times a day (QID) | INTRAMUSCULAR | Status: DC | PRN
  Administered 2021-11-20: 16:00:00 4 mg via INTRAVENOUS
  Filled 2021-11-20: qty 2

## 2021-11-20 MED ORDER — PROPOFOL 10 MG/ML IV BOLUS
INTRAVENOUS | Status: DC | PRN
Start: 2021-11-20 — End: 2021-11-20
  Administered 2021-11-20: 150 mg via INTRAVENOUS

## 2021-11-20 MED ORDER — GABAPENTIN 300 MG PO CAPS
300.0000 mg | ORAL_CAPSULE | Freq: Every day | ORAL | Status: DC
  Administered 2021-11-20: 22:00:00 300 mg via ORAL
  Filled 2021-11-20: qty 1

## 2021-11-20 MED ORDER — ONDANSETRON 4 MG PO TBDP: 4.0000 mg | ORAL_TABLET | Freq: Four times a day (QID) | ORAL | Status: DC | PRN

## 2021-11-20 MED ORDER — ONDANSETRON HCL 4 MG/2ML IJ SOLN
INTRAMUSCULAR | Status: DC | PRN
  Administered 2021-11-20: 4 mg via INTRAVENOUS

## 2021-11-20 MED ORDER — DEXAMETHASONE SODIUM PHOSPHATE 10 MG/ML IJ SOLN
INTRAMUSCULAR | Status: AC
  Filled 2021-11-20: qty 1

## 2021-11-20 MED ORDER — ACETAMINOPHEN 500 MG PO TABS
ORAL_TABLET | ORAL | Status: AC
  Filled 2021-11-20: qty 2

## 2021-11-20 MED ORDER — LIDOCAINE 2% (20 MG/ML) 5 ML SYRINGE
INTRAMUSCULAR | Status: AC
  Filled 2021-11-20: qty 5

## 2021-11-20 MED ORDER — HYDROCHLOROTHIAZIDE 12.5 MG PO TABS
12.5000 mg | ORAL_TABLET | Freq: Once | ORAL | Status: DC
  Filled 2021-11-20: qty 1

## 2021-11-20 MED ORDER — LEVOTHYROXINE SODIUM 100 MCG PO TABS
100.0000 ug | ORAL_TABLET | Freq: Every day | ORAL | Status: DC
  Administered 2021-11-21: 100 ug via ORAL
  Filled 2021-11-20: qty 1

## 2021-11-20 MED ORDER — FENTANYL CITRATE (PF) 100 MCG/2ML IJ SOLN
25.0000 ug | INTRAMUSCULAR | Status: DC | PRN
  Administered 2021-11-20 (×2): 50 ug via INTRAVENOUS

## 2021-11-20 MED ORDER — LIDOCAINE HCL (CARDIAC) PF 100 MG/5ML IV SOSY
PREFILLED_SYRINGE | INTRAVENOUS | Status: DC | PRN
  Administered 2021-11-20: 80 mg via INTRAVENOUS

## 2021-11-20 MED ORDER — MIDAZOLAM HCL 2 MG/2ML IJ SOLN
1.0000 mg | Freq: Once | INTRAMUSCULAR | Status: AC
Start: 2021-11-20 — End: 2021-11-20
  Administered 2021-11-20: 11:00:00 1 mg via INTRAVENOUS

## 2021-11-20 MED ORDER — METHOCARBAMOL 500 MG PO TABS
500.0000 mg | ORAL_TABLET | Freq: Four times a day (QID) | ORAL | Status: DC | PRN
  Administered 2021-11-20: 16:00:00 500 mg via ORAL
  Filled 2021-11-20: qty 1

## 2021-11-20 MED ORDER — DIPHENHYDRAMINE HCL 50 MG/ML IJ SOLN: 12.5000 mg | Freq: Four times a day (QID) | INTRAMUSCULAR | Status: DC | PRN

## 2021-11-20 MED ORDER — AMISULPRIDE (ANTIEMETIC) 5 MG/2ML IV SOLN: 10.0000 mg | Freq: Once | INTRAVENOUS | Status: DC | PRN

## 2021-11-20 MED ORDER — BUPIVACAINE HCL (PF) 0.5 % IJ SOLN
INTRAMUSCULAR | Status: DC | PRN
  Administered 2021-11-20: 20 mL

## 2021-11-20 MED ORDER — HYDROCHLOROTHIAZIDE 12.5 MG PO TABS
12.5000 mg | ORAL_TABLET | Freq: Once | ORAL | Status: DC
Start: 2021-11-20 — End: 2021-11-21

## 2021-11-20 MED ORDER — DIPHENHYDRAMINE HCL 12.5 MG/5ML PO ELIX
12.5000 mg | ORAL_SOLUTION | Freq: Four times a day (QID) | ORAL | Status: DC | PRN
Start: 2021-11-20 — End: 2021-11-21

## 2021-11-20 MED ORDER — HYDROCHLOROTHIAZIDE 12.5 MG PO CAPS
12.5000 mg | ORAL_CAPSULE | Freq: Once | ORAL | Status: DC
  Filled 2021-11-20: qty 1

## 2021-11-20 MED ORDER — PROPOFOL 500 MG/50ML IV EMUL
INTRAVENOUS | Status: DC | PRN
  Administered 2021-11-20: 25 ug/kg/min via INTRAVENOUS

## 2021-11-20 MED ORDER — ACETAMINOPHEN 500 MG PO TABS
1000.0000 mg | ORAL_TABLET | ORAL | Status: AC
  Administered 2021-11-20: 12:00:00 1000 mg via ORAL

## 2021-11-20 MED ORDER — LOSARTAN POTASSIUM 50 MG PO TABS
50.0000 mg | ORAL_TABLET | Freq: Every day | ORAL | Status: DC
  Filled 2021-11-20: qty 1

## 2021-11-20 MED ORDER — HYDROMORPHONE HCL 1 MG/ML IJ SOLN: 0.5000 mg | INTRAMUSCULAR | Status: DC | PRN

## 2021-11-20 MED ORDER — PROPOFOL 10 MG/ML IV BOLUS
INTRAVENOUS | Status: AC
Start: 2021-11-20 — End: ?
  Filled 2021-11-20: qty 20

## 2021-11-20 MED ORDER — DOXEPIN HCL 10 MG PO CAPS
10.0000 mg | ORAL_CAPSULE | Freq: Every day | ORAL | Status: DC
  Filled 2021-11-20: qty 1

## 2021-11-20 MED ORDER — CEFAZOLIN SODIUM-DEXTROSE 2-4 GM/100ML-% IV SOLN
INTRAVENOUS | Status: AC
  Filled 2021-11-20: qty 100

## 2021-11-20 MED ORDER — MIDAZOLAM HCL 2 MG/2ML IJ SOLN
INTRAMUSCULAR | Status: AC
  Filled 2021-11-20: qty 2

## 2021-11-20 MED ORDER — MELATONIN 5 MG PO TABS
10.0000 mg | ORAL_TABLET | Freq: Every evening | ORAL | Status: DC | PRN
  Administered 2021-11-20: 22:00:00 10 mg via ORAL
  Filled 2021-11-20: qty 2

## 2021-11-20 MED ORDER — HYDROCODONE-ACETAMINOPHEN 5-325 MG PO TABS: 1.0000 | ORAL_TABLET | ORAL | Status: DC | PRN

## 2021-11-20 MED ORDER — LACTATED RINGERS IV SOLN: INTRAVENOUS | Status: DC | PRN

## 2021-11-20 MED ORDER — HYDROCHLOROTHIAZIDE 12.5 MG PO CAPS
12.5000 mg | ORAL_CAPSULE | Freq: Every day | ORAL | Status: DC
  Filled 2021-11-20 (×2): qty 1

## 2021-11-20 MED ORDER — DEXAMETHASONE SODIUM PHOSPHATE 10 MG/ML IJ SOLN
INTRAMUSCULAR | Status: DC | PRN
  Administered 2021-11-20: 10 mg via INTRAVENOUS

## 2021-11-20 MED ORDER — ONDANSETRON HCL 4 MG/2ML IJ SOLN: 4.0000 mg | Freq: Once | INTRAMUSCULAR | Status: DC | PRN

## 2021-11-20 MED ORDER — CEFAZOLIN SODIUM-DEXTROSE 2-4 GM/100ML-% IV SOLN
2.0000 g | INTRAVENOUS | Status: AC
  Administered 2021-11-20: 13:00:00 2 g via INTRAVENOUS

## 2021-11-20 SURGICAL SUPPLY — 51 items
ADH SKN CLS APL DERMABOND .7 (GAUZE/BANDAGES/DRESSINGS) ×1
APL PRP STRL LF DISP 70% ISPRP (MISCELLANEOUS) ×1
APPLIER CLIP 9.375 MED OPEN (MISCELLANEOUS) ×2
APR CLP MED 9.3 20 MLT OPN (MISCELLANEOUS) ×1
BINDER BREAST XLRG (GAUZE/BANDAGES/DRESSINGS) ×1 IMPLANT
BLADE SURG 15 STRL LF DISP TIS (BLADE) ×1 IMPLANT
BLADE SURG 15 STRL SS (BLADE) ×2
CANISTER SUCT 1200ML W/VALVE (MISCELLANEOUS) ×2 IMPLANT
CHLORAPREP W/TINT 26 (MISCELLANEOUS) ×2 IMPLANT
CLIP APPLIE 9.375 MED OPEN (MISCELLANEOUS) IMPLANT
COVER BACK TABLE 60X90IN (DRAPES) ×2 IMPLANT
COVER MAYO STAND STRL (DRAPES) ×2 IMPLANT
COVER PROBE W GEL 5X96 (DRAPES) ×2 IMPLANT
DERMABOND ADVANCED (GAUZE/BANDAGES/DRESSINGS) ×1
DERMABOND ADVANCED .7 DNX12 (GAUZE/BANDAGES/DRESSINGS) ×1 IMPLANT
DRAIN CHANNEL 19F RND (DRAIN) ×3 IMPLANT
DRAPE LAPAROSCOPIC ABDOMINAL (DRAPES) ×2 IMPLANT
DRAPE UTILITY XL STRL (DRAPES) ×2 IMPLANT
DRSG PAD ABDOMINAL 8X10 ST (GAUZE/BANDAGES/DRESSINGS) ×2 IMPLANT
ELECT REM PT RETURN 9FT ADLT (ELECTROSURGICAL) ×2
ELECTRODE REM PT RTRN 9FT ADLT (ELECTROSURGICAL) ×1 IMPLANT
EVACUATOR SILICONE 100CC (DRAIN) ×3 IMPLANT
GAUZE SPONGE 4X4 12PLY STRL (GAUZE/BANDAGES/DRESSINGS) ×1 IMPLANT
GAUZE SPONGE 4X4 12PLY STRL LF (GAUZE/BANDAGES/DRESSINGS) IMPLANT
GLOVE SURG POLYISO LF SZ6.5 (GLOVE) ×1 IMPLANT
GLOVE SURG POLYISO LF SZ7 (GLOVE) ×1 IMPLANT
GLOVE SURG SIGNA 7.5 PF LTX (GLOVE) ×2 IMPLANT
GLOVE SURG UNDER POLY LF SZ6.5 (GLOVE) ×1 IMPLANT
GLOVE SURG UNDER POLY LF SZ7 (GLOVE) ×1 IMPLANT
GOWN STRL REUS W/ TWL LRG LVL3 (GOWN DISPOSABLE) ×1 IMPLANT
GOWN STRL REUS W/ TWL XL LVL3 (GOWN DISPOSABLE) ×1 IMPLANT
GOWN STRL REUS W/TWL LRG LVL3 (GOWN DISPOSABLE) ×2
GOWN STRL REUS W/TWL XL LVL3 (GOWN DISPOSABLE) ×6
HEMOSTAT SURGICEL 2X14 (HEMOSTASIS) IMPLANT
NDL HYPO 25X1 1.5 SAFETY (NEEDLE) ×2 IMPLANT
NEEDLE HYPO 25X1 1.5 SAFETY (NEEDLE) ×2 IMPLANT
NS IRRIG 1000ML POUR BTL (IV SOLUTION) ×2 IMPLANT
PACK BASIN DAY SURGERY FS (CUSTOM PROCEDURE TRAY) ×2 IMPLANT
PENCIL SMOKE EVACUATOR (MISCELLANEOUS) ×2 IMPLANT
PIN SAFETY STERILE (MISCELLANEOUS) ×2 IMPLANT
SLEEVE SCD COMPRESS KNEE MED (STOCKING) ×2 IMPLANT
SPONGE T-LAP 18X18 ~~LOC~~+RFID (SPONGE) ×3 IMPLANT
SUT ETHILON 2 0 FS 18 (SUTURE) ×2 IMPLANT
SUT ETHILON 3 0 PS 1 (SUTURE) ×1 IMPLANT
SUT MNCRL AB 4-0 PS2 18 (SUTURE) ×3 IMPLANT
SUT VICRYL 3-0 CR8 SH (SUTURE) ×2 IMPLANT
SYR BULB EAR ULCER 3OZ GRN STR (SYRINGE) ×2 IMPLANT
SYR CONTROL 10ML LL (SYRINGE) ×3 IMPLANT
TOWEL GREEN STERILE FF (TOWEL DISPOSABLE) ×2 IMPLANT
TUBE CONNECTING 20X1/4 (TUBING) ×2 IMPLANT
YANKAUER SUCT BULB TIP NO VENT (SUCTIONS) ×2 IMPLANT

## 2021-11-20 NOTE — Anesthesia Procedure Notes (Signed)
Procedure Name: LMA Insertion ?Date/Time: 11/20/2021 12:45 PM ?Performed by: Verita Lamb, CRNA ?Pre-anesthesia Checklist: Patient identified, Emergency Drugs available, Suction available and Patient being monitored ?Patient Re-evaluated:Patient Re-evaluated prior to induction ?Oxygen Delivery Method: Circle system utilized ?Preoxygenation: Pre-oxygenation with 100% oxygen ?Induction Type: IV induction ?Ventilation: Mask ventilation without difficulty ?LMA: LMA inserted ?LMA Size: 4.0 ?Number of attempts: 1 ?Airway Equipment and Method: Bite block ?Placement Confirmation: positive ETCO2, CO2 detector and breath sounds checked- equal and bilateral ?Tube secured with: Tape ?Dental Injury: Teeth and Oropharynx as per pre-operative assessment  ? ? ? ? ?

## 2021-11-20 NOTE — Anesthesia Postprocedure Evaluation (Signed)
Anesthesia Post Note ? ?Patient: Denise [name] ? ?Procedure(s) Performed: LEFT TOTAL MASTECTOMY (Left: Breast) ?RADIOACTIVE SEED GUIDED LEFT AXILLARY SENTINEL LYMPH NODE DISSECTION (Left: Breast) ? ?  ? ?Patient location during evaluation: PACU ?Anesthesia Type: Regional and General ?Level of consciousness: awake ?Pain management: pain level controlled ?Vital Signs Assessment: post-procedure vital signs reviewed and stable ?Respiratory status: spontaneous breathing and respiratory function stable ?Cardiovascular status: stable ?Postop Assessment: no apparent nausea or vomiting ?Anesthetic complications: no ? ? ?No notable events documented. ? ?Last Vitals:  ?Vitals:  ? 11/20/21 1500 11/20/21 1515  ?BP: 137/74 135/76  ?Pulse: (!) 58 61  ?Resp: 11 14  ?Temp:    ?SpO2: 100% 97%  ?  ?Last Pain:  ?Vitals:  ? 11/20/21 1515  ?TempSrc:   ?PainSc: 3   ? ? ?  ?  ?  ?  ?  ?  ? ?Merlinda Frederick ? ? ? ? ?

## 2021-11-20 NOTE — Interval H&P Note (Signed)
History and Physical Interval Note:NO CHANGE IN H AND P ? ?11/20/2021 ?11:40 AM ? ?Denise [name]  has presented today for surgery, with the diagnosis of LEFT BREAST CANCER.  The various methods of treatment have been discussed with the patient and family. After consideration of risks, benefits and other options for treatment, the patient has consented to  Procedure(s): ?LEFT TOTAL MASTECTOMY (Left) ?RADIOACTIVE SEED GUIDED LEFT AXILLARY SENTINEL LYMPH NODE DISSECTION (Left) as a surgical intervention.  The patient's history has been reviewed, patient examined, no change in status, stable for surgery.  I have reviewed the patient's chart and labs.  Questions were answered to the patient's satisfaction.   ? ? ?Coralie Keens ? ? ?

## 2021-11-20 NOTE — Progress Notes (Signed)
Assisted Dr. Elgie Congo with left, ultrasound guided, pectoralis block. Side rails up, monitors on throughout procedure. See vital signs in flow sheet. Tolerated Procedure well. ?

## 2021-11-20 NOTE — Transfer of Care (Signed)
Immediate Anesthesia Transfer of Care Note ? ?Patient: Denise [name] ? ?Procedure(s) Performed: LEFT TOTAL MASTECTOMY (Left: Breast) ?RADIOACTIVE SEED GUIDED LEFT AXILLARY SENTINEL LYMPH NODE DISSECTION (Left: Breast) ? ?Patient Location: PACU ? ?Anesthesia Type:General ? ?Level of Consciousness: awake, alert , oriented, drowsy and patient cooperative ? ?Airway & Oxygen Therapy: Patient Spontanous Breathing and Patient connected to face mask oxygen ? ?Post-op Assessment: Report given to RN and Post -op Vital signs reviewed and stable ? ?Post vital signs: Reviewed and stable ? ?Last Vitals:  ?Vitals Value Taken Time  ?BP    ?Temp    ?Pulse    ?Resp    ?SpO2    ? ? ?Last Pain:  ?Vitals:  ? 11/20/21 1121  ?TempSrc: Oral  ?PainSc: 0-No pain  ?   ? ?  ? ?Complications: No notable events documented. ?

## 2021-11-20 NOTE — Op Note (Signed)
? ?Denise [name] ?11/20/2021 ? ? ?Pre-op Diagnosis: LEFT BREAST CANCER ?    ?Post-op Diagnosis: samde ? ?Procedure(s): ?LEFT TOTAL MASTECTOMY ?RADIOACTIVE SEED GUIDED LEFT AXILLARY DEEP TARGETED LYMPH NODE DISSECTION ? ?Surgeon(s): ?Coralie Keens, MD ? ?Assistant:  Albin Felling, MD [facility] Resident ? ?Anesthesia: General ? ?Staff:  ?Circulator: Izora Ribas, RN ?Relief Circulator: Charisse March, RN ?Scrub Person: Lovett Sox, CST ? ?Estimated Blood Loss: Minimal ?              ?Specimens: sent to path ? ?Indications: This is a 71 year old female who was found last year to have an invasive left breast lobular invasive carcinoma.  She had a lymph node biopsy which was positive as well.  She has been undergoing neoadjuvant therapy.  Her most recent MRI showed that the mass was still was 4 cm in size and she still has significantly enlarged lymph nodes in her axilla.  The decision was made to proceed with a left mastectomy and targeted lymph node dissection ? ?Procedure: The patient is brought to operating identifies correct patient.  She is placed upon the operating table general esthesia was induced.  Her left breast, chest, and axilla were then prepped and draped in usual sterile fashion.  We made elliptical incision on the left chest transversely going medial to lateral staying widely around the nipple areolar complex.  We then dissected down to the breast tissue with electrocautery.  The superior skin flap was dissected out first going up to the level of the clavicle and the down to the chest wall.  We then dissected out the inferior flap with the cautery staying just underneath the dermis going down to the inframammary ridge and then down to the chest wall.  We took the dissection laterally toward the axilla.  With the neoprobe we identified the previously biopsied node with a radioactive seed.  This was surrounded by several enlarged palpable nodes as well.  We dissected the breast off of the  chest wall moving medial to lateral with electrocautery and then completed the mastectomy laterally removing just the breast tissue.  We marked the lateral margin with a suture.  The breast was then sent to pathology.  Again she had a large amount of palpable lymph nodes in her axilla.  We performed a targeted lymph node dissection initially removing the nodes around the previously targeted node but continued dissection toward the deeper axilla as there were several hard palpable lymph nodes here as well.  Several small bridging veins and lymphatics were clipped with surgical clips.  We then completed dissection removing a large amount of fatty tissue containing multiple palpable lymph nodes.  We x-rayed the nodal package and the targeted lymph node was in the specimen.  The lymph nodes was then sent to pathology for evaluation.  We then irrigated the skin flaps and axilla with saline.  Hemostasis appeared to be achieved.  2 separate skin incisions were made into separate 19 French Blake drains were placed to the incision.  All placed at the site of the mastectomy and the other was placed into the axilla.  The drains were sutured in place with nylon sutures.  We then closed the mastectomy site with interrupted 3-0 Vicryl sutures and running 4-0 Monocryl sutures.  Dermabond and a breast binder were then applied.  The patient tolerated the procedure well.  All the counts were correct at the end of the procedure.  The patient was then extubated in the operating room  and taken in stable condition to the recovery room. ?        ? ?Coralie Keens  ? ?Date: 11/20/2021  Time: 1:59 PM ? ? ? ?

## 2021-11-20 NOTE — Anesthesia Procedure Notes (Addendum)
Anesthesia Regional Block: Pectoralis block  ? ?Pre-Anesthetic Checklist: , timeout performed,  Correct Patient, Correct Site, Correct Laterality,  Correct Procedure, Correct Position, site marked,  Risks and benefits discussed,  Surgical consent,  Pre-op evaluation,  At surgeon's request and post-op pain management ? ?Laterality: Left ? ?Prep: chloraprep     ?  ?Needles:  ?Injection technique: Single-shot ? ?Needle Type: Echogenic Stimulator Needle   ? ? ?Needle Length: 10cm  ?Needle Gauge: 20  ? ? ? ?Additional Needles: ? ? ?Procedures:,,,, ultrasound used (permanent image in chart),,    ?Narrative:  ?Start time: 11/20/2021 11:25 AM ?End time: 11/20/2021 11:30 AM ?Injection made incrementally with aspirations every 5 mL. ? ?Performed by: Personally  ?Anesthesiologist: Merlinda Frederick, MD ? ?Additional Notes: ?Functioning IV was confirmed and monitors were applied.  Sterile prep and drape,hand hygiene and sterile gloves were used. Ultrasound guidance: relevant anatomy identified, needle position confirmed, local anesthetic spread visualized around nerve(s)., vascular puncture avoided. Negative aspiration and negative test dose prior to incremental administration of local anesthetic. The patient tolerated the procedure well. ? ? ? ? ? ? ?

## 2021-11-20 NOTE — Discharge Instructions (Addendum)
[facility], [place] ?[phone] ? ?MASTECTOMY: POST OP INSTRUCTIONS ? ?Always review your discharge instruction sheet given to you by the facility where your surgery was performed. ?IF YOU HAVE DISABILITY OR FAMILY LEAVE FORMS, YOU MUST BRING THEM TO THE OFFICE FOR PROCESSING.   ?DO NOT GIVE THEM TO YOUR DOCTOR. ?A prescription for pain medication may be given to you upon discharge.  Take your pain medication as prescribed, if needed.  If narcotic pain medicine is not needed, then you may take acetaminophen (Tylenol) or ibuprofen (Advil) as needed. ?Take your usually prescribed medications unless otherwise directed. ?If you need a refill on your pain medication, please contact your pharmacy.  They will contact our office to request authorization.  Prescriptions will not be filled after 5pm or on week-ends. ?You should follow a light diet the first few days after arrival home, such as soup and crackers, etc.  Resume your normal diet the day after surgery. ?Most patients will experience some swelling and bruising on the chest and underarm.  Ice packs will help.  Swelling and bruising can take several days to resolve.  ?It is common to experience some constipation if taking pain medication after surgery.  Increasing fluid intake and taking a stool softener (such as Colace) will usually help or prevent this problem from occurring.  A mild laxative (Milk of Magnesia or Miralax) should be taken according to package instructions if there are no bowel movements after 48 hours. ?Unless discharge instructions indicate otherwise, leave your bandage dry and in place until your next appointment in 3-5 days.  You may take a limited sponge bath.  No tube baths or showers until the drains are removed.  You may have steri-strips (small skin tapes) in place directly over the incision.  These strips should be left on the skin for 7-10 days.  If your surgeon used skin glue on the incision, you may shower in 24 hours.   The glue will flake off over the next 2-3 weeks.  Any sutures or staples will be removed at the office during your follow-up visit. ?DRAINS:  If you have drains in place, it is important to keep a list of the amount of drainage produced each day in your drains.  Before leaving the hospital, you should be instructed on drain care.  Call our office if you have any questions about your drains. ?ACTIVITIES:  You may resume regular (light) daily activities beginning the next day--such as daily self-care, walking, climbing stairs--gradually increasing activities as tolerated.  You may have sexual intercourse when it is comfortable.  Refrain from any heavy lifting or straining until approved by your doctor. ?You may drive when you are no longer taking prescription pain medication, you can comfortably wear a seatbelt, and you can safely maneuver your car and apply brakes. ?RETURN TO WORK:  __________________________________________________________ ?You should see your doctor in the office for a follow-up appointment approximately 3-5 days after your surgery.  Your doctor?s nurse will typically make your follow-up appointment when she calls you with your pathology report.  Expect your pathology report 2-3 business days after your surgery.  You may call to check if you do not hear from [place] after three days.   ?OTHER INSTRUCTIONS: OK TO SHOWER ?______________________________________________________________________________________________ ____________________________________________________________________________________________ ?WHEN TO CALL YOUR DOCTOR: ?Fever over 101.0 ?Nausea and/or vomiting ?Extreme swelling or bruising ?Continued bleeding from incision. ?Increased pain, redness, or drainage from the incision. ?The clinic staff is available to answer your questions during regular business hours.  Please don?t hesitate to call and ask to speak to one of the nurses for clinical concerns.  If you have a medical emergency, go  to the nearest emergency room or call 911.  A surgeon from [facility] is always on call at the hospital. ?[address] [place], [place], [place]  [address] ? [address], [place], [place]   [address] ?[phone] ? [phone] ? FAX [phone] ?Web site: [email or web address]  ?

## 2021-11-21 ENCOUNTER — Encounter (HOSPITAL_BASED_OUTPATIENT_CLINIC_OR_DEPARTMENT_OTHER): Payer: Self-pay | Admitting: Surgery

## 2021-11-21 DIAGNOSIS — Z79899 Other long term (current) drug therapy: Secondary | ICD-10-CM | POA: Diagnosis not present

## 2021-11-21 DIAGNOSIS — Z7982 Long term (current) use of aspirin: Secondary | ICD-10-CM | POA: Diagnosis not present

## 2021-11-21 DIAGNOSIS — D0502 Lobular carcinoma in situ of left breast: Secondary | ICD-10-CM | POA: Diagnosis not present

## 2021-11-21 DIAGNOSIS — I10 Essential (primary) hypertension: Secondary | ICD-10-CM | POA: Diagnosis not present

## 2021-11-21 DIAGNOSIS — C773 Secondary and unspecified malignant neoplasm of axilla and upper limb lymph nodes: Secondary | ICD-10-CM | POA: Diagnosis not present

## 2021-11-21 DIAGNOSIS — J45909 Unspecified asthma, uncomplicated: Secondary | ICD-10-CM | POA: Diagnosis not present

## 2021-11-21 NOTE — Discharge Summary (Signed)
Physician Discharge Summary  ?Patient ID: ?Denise [name] ?MRN: [id number] ?DOB/AGE: November 02, 1950 71 y.o. ? ?Admit date: 11/20/2021 ?Discharge date: 11/21/2021 ? ?Admission Diagnoses: ? ?Discharge Diagnoses:  ?Principal Problem: ?  S/P left mastectomy ? ? ?Discharged Condition: good ? ?Hospital Course: uneventful post op recovery.  Discharged home POD#1 ? ?Consults: None ? ?Significant Diagnostic Studies:  ? ?Treatments: surgery: left mastectomy with targeted lymph node dissection ? ?Discharge Exam: ?Blood pressure 117/73, pulse 74, temperature 98.2 ?F (36.8 ?C), resp. rate 16, height '5\' 5"'$  (1.651 m), weight 81.6 kg, SpO2 95 %. ?General appearance: alert, cooperative, and no distress ?Resp: clear to auscultation bilaterally ?Cardio: regular rate and rhythm, S1, S2 normal, no murmur, click, rub or gallop ?Incision/Wound: skin flaps viable, no hematoma ? ?Disposition: Discharge disposition: 01-Home or Self Care ? ? ? ? ? ? ? ?Allergies as of 11/21/2021   ? ?   Reactions  ? Exforge [amlodipine Besylate-valsartan] Nausea And Vomiting  ? Tape Other (See Comments)  ? Redness and Irriatation  ? Cheese   ? Hard cheese  ? Levofloxacin In D5w Hives  ? Strawberry Extract Diarrhea  ? Seeds,nuts, lettuce, grapes  ? Tramadol Hcl Nausea And Vomiting  ? Yeast-related Products Hives  ? Mold on bread  ? ?  ? ?  ?Medication List  ?  ? ?TAKE these medications   ? ?albuterol 108 (90 Base) MCG/ACT inhaler ?Commonly known as: VENTOLIN HFA ?Inhale into the lungs. ?  ?benazepril 20 MG tablet ?Commonly known as: LOTENSIN ?Take by mouth. ?  ?CARBOPLATIN IV ?Inject into the vein once a week. ?  ?doxepin 10 MG capsule ?Commonly known as: SINEQUAN ?Take 10 mg by mouth at bedtime. ?  ?filgrastim 480 MCG/0.8ML Sosy injection ?Commonly known as: NEUPOGEN ?Inject 0.8 mLs (480 mcg total) into the skin daily as needed for up to 8 doses (self-administer subcutaneously as directed by Dr. Delton Coombes as needed for neutropenia). ?  ?FISH OIL PO ?Take by  mouth. ?  ?gabapentin 300 MG capsule ?Commonly known as: NEURONTIN ?Take 1 capsule by mouth at bedtime. ?  ?hydrochlorothiazide 12.5 MG capsule ?Commonly known as: MICROZIDE ?Take 12.5 mg by mouth daily. ?  ?KEYTRUDA IV ?Inject into the vein every 21 ( twenty-one) days. ?  ?levothyroxine 75 MCG tablet ?Commonly known as: SYNTHROID ?Take 100 mcg by mouth daily before breakfast. ?  ?lidocaine-prilocaine cream ?Commonly known as: EMLA ?Apply a small amount to port a cath site and cover with plastic wrap 1 hour prior to infusion appointments ?  ?LORazepam 0.5 MG tablet ?Commonly known as: ATIVAN ?Take 0.5 mg by mouth daily as needed. ?  ?losartan 50 MG tablet ?Commonly known as: COZAAR ?Take 50 mg by mouth daily. ?  ?magnesium oxide 400 MG tablet ?Commonly known as: MAG-OX ?Take 1 tablet by mouth daily. ?  ?melatonin 5 MG Tabs ?Take 10 mg by mouth. As needed for sleep ?  ?PACLITAXEL IV ?Inject into the vein once a week. ?  ?prochlorperazine 10 MG tablet ?Commonly known as: COMPAZINE ?Take 1 tablet (10 mg total) by mouth every 6 (six) hours as needed (Nausea or vomiting). ?  ?Stiolto Respimat 2.5-2.5 MCG/ACT Aers ?Generic drug: Tiotropium Bromide-Olodaterol ?Inhale 1-2 puffs into the lungs as directed. ?  ?TYLENOL PO ?Take 1 tablet by mouth as needed. ?  ? ?  ? ? Follow-up Information   ? ? Coralie Keens, MD Follow up on 12/05/2021.   ?Specialty: General Surgery ?Why: call the office to find out the time to come  on 3/24 for possible drain removal ?Contact information: ?[address] ?[address] ?[place] [address] ?[phone] ? ? ?  ?  ? ?  ?  ? ?  ? ? ?Signed: ?Coralie Keens ?11/21/2021, 7:57 AM ? ? ?

## 2021-11-24 LAB — SURGICAL PATHOLOGY

## 2021-12-12 DIAGNOSIS — E038 Other specified hypothyroidism: Secondary | ICD-10-CM | POA: Diagnosis not present

## 2021-12-12 DIAGNOSIS — E785 Hyperlipidemia, unspecified: Secondary | ICD-10-CM | POA: Diagnosis not present

## 2021-12-12 DIAGNOSIS — I700 Atherosclerosis of aorta: Secondary | ICD-10-CM | POA: Diagnosis not present

## 2021-12-12 DIAGNOSIS — I10 Essential (primary) hypertension: Secondary | ICD-10-CM | POA: Diagnosis not present

## 2021-12-17 ENCOUNTER — Encounter (HOSPITAL_COMMUNITY): Payer: Self-pay

## 2021-12-22 ENCOUNTER — Telehealth (HOSPITAL_COMMUNITY): Payer: Self-pay | Admitting: Pharmacist

## 2021-12-22 ENCOUNTER — Encounter (HOSPITAL_COMMUNITY): Payer: Self-pay | Admitting: Hematology

## 2021-12-22 ENCOUNTER — Other Ambulatory Visit (HOSPITAL_COMMUNITY): Payer: Self-pay

## 2021-12-22 ENCOUNTER — Telehealth (HOSPITAL_COMMUNITY): Payer: Self-pay | Admitting: Pharmacy Technician

## 2021-12-22 ENCOUNTER — Inpatient Hospital Stay (HOSPITAL_COMMUNITY): Payer: Medicare Other | Attending: Hematology | Admitting: Hematology

## 2021-12-22 VITALS — BP 138/81 | HR 81 | Temp 98.2°F | Resp 20

## 2021-12-22 DIAGNOSIS — Z79899 Other long term (current) drug therapy: Secondary | ICD-10-CM | POA: Insufficient documentation

## 2021-12-22 DIAGNOSIS — R200 Anesthesia of skin: Secondary | ICD-10-CM | POA: Insufficient documentation

## 2021-12-22 DIAGNOSIS — C50912 Malignant neoplasm of unspecified site of left female breast: Secondary | ICD-10-CM

## 2021-12-22 DIAGNOSIS — Z801 Family history of malignant neoplasm of trachea, bronchus and lung: Secondary | ICD-10-CM | POA: Insufficient documentation

## 2021-12-22 DIAGNOSIS — Z803 Family history of malignant neoplasm of breast: Secondary | ICD-10-CM | POA: Insufficient documentation

## 2021-12-22 DIAGNOSIS — Z5112 Encounter for antineoplastic immunotherapy: Secondary | ICD-10-CM | POA: Diagnosis not present

## 2021-12-22 DIAGNOSIS — G629 Polyneuropathy, unspecified: Secondary | ICD-10-CM | POA: Diagnosis not present

## 2021-12-22 DIAGNOSIS — E039 Hypothyroidism, unspecified: Secondary | ICD-10-CM | POA: Insufficient documentation

## 2021-12-22 MED ORDER — CAPECITABINE 500 MG PO TABS
ORAL_TABLET | ORAL | 0 refills | Status: DC
  Filled 2021-12-22: qty 84, fill #0

## 2021-12-22 MED ORDER — PROCHLORPERAZINE MALEATE 10 MG PO TABS
10.0000 mg | ORAL_TABLET | Freq: Four times a day (QID) | ORAL | 3 refills | Status: DC | PRN
  Filled 2021-12-22: qty 30, 8d supply, fill #0

## 2021-12-22 NOTE — Telephone Encounter (Signed)
Oral Oncology Patient Advocate Encounter ? ?After completing a benefits investigation, prior authorization for Capecitabine (Xeloda) is not required at this time through Medicare B. ? ?Patient's copay is $0.00. ? ?Dennison Nancy CPHT ?Specialty Pharmacy Patient Advocate ?[place] ?Phone [phone] ?Fax [phone] ?12/22/2021 4:21 PM ?   ? ? ?  ? ?

## 2021-12-22 NOTE — Telephone Encounter (Signed)
Oral Oncology Pharmacist Encounter ? ?Received new prescription for Xeloda (capecitabine) for the treatment of stage IIIc in conjunction with pembrolizumub, planned duration of ~8 cycles. She is s/p neoadjuvant carbo/taxol/pembro and left mastectomy. Planned start 01/01/22. ? ?BMP from 11/18/21 assessed, no relevant lab abnormalities. Prescription dose and frequency assessed. MD starting patient on a reduced dose. ? ?Current medication list in Epic reviewed, one relevant DDIs with capecitabine identified: ?Doxepin: capecitabine may may enhance the QTc-prolonging effect of doxepin. Patient is on a low dose of doxepin, could consider monitoring ECG and/or s/sx of QTc prolongation.   ? ?Evaluated chart and no patient barriers to medication adherence identified.  ? ?Prescription has been e-scribed to the [facility] for benefits analysis and approval. ? ?Oral Oncology Clinic will continue to follow for insurance authorization, copayment issues, initial counseling and start date. ? ? ?Darl Pikes, PharmD, BCPS, BCOP, CPP ?Hematology/Oncology Clinical Pharmacist Practitioner ?[place]/[facility]/AP Oral Chemotherapy Navigation Clinic ?[phone] ? ?12/22/2021 1:45 PM ? ?

## 2021-12-22 NOTE — Patient Instructions (Addendum)
[place] at [facility] ?Discharge Instructions ? ? ?You were seen and examined today by Dr. Delton Coombes. ? ?He reviewed the results of your pathology report with you.  ? ?We will restart treatment on Keytruda. He will also start you on a chemo pill called Xeloda. You will take this twice a day for two weeks and then take 1 week off before you restart again (2 weeks on 1 week off).  ? ?Return as scheduled.  ? ? ?Thank you for choosing [place] at [facility] to provide your oncology and hematology care.  To afford each patient quality time with our provider, please arrive at least 15 minutes before your scheduled appointment time.  ? ?If you have a lab appointment with the [facility] please come in thru the Main Entrance and check in at the main information desk. ? ?You need to re-schedule your appointment should you arrive 10 or more minutes late.  We strive to give you quality time with our providers, and arriving late affects you and other patients whose appointments are after yours.  Also, if you no show three or more times for appointments you may be dismissed from the clinic at the providers discretion.     ?Again, thank you for choosing [facility].  Our hope is that these requests will decrease the amount of time that you wait before being seen by our physicians.       ?_____________________________________________________________ ? ?Should you have questions after your visit to [facility], please contact our office at [phone] and follow the prompts.  Our office hours are 8:00 a.m. and 4:30 p.m. Monday - Friday.  Please note that voicemails left after 4:00 p.m. may not be returned until the following business day.  We are closed weekends and major holidays.  You do have access to a nurse 24-7, just call the main number to the clinic [phone] and do not press any options, hold on the line and a nurse will answer the  phone.   ? ?For prescription refill requests, have your pharmacy contact our office and allow 72 hours.   ? ?Due to Covid, you will need to wear a mask upon entering the hospital. If you do not have a mask, a mask will be given to you at the Main Entrance upon arrival. For doctor visits, patients may have 1 support person age 46 or older with them. For treatment visits, patients can not have anyone with them due to social distancing guidelines and our immunocompromised population.  ? ?   ?

## 2021-12-22 NOTE — Progress Notes (Signed)
? ?[place] ?[address] ?[place], [place] [address] ? ? ?Patient Care Team: ?Neale Burly, MD as PCP - General (Internal Medicine) ?Derek Jack, MD as Medical Oncologist (Medical Oncology) ? ?SUMMARY OF ONCOLOGIC HISTORY: ?Oncology History  ?Invasive lobular carcinoma of left breast in female [facility])  ?01/23/2021 Initial Diagnosis  ? Invasive lobular carcinoma of left breast in female [facility]) ?  ?02/19/2021 Genetic Testing  ? Negative genetic testing on the CancerNext-Expanded+RNAinsight panel.  SMARCB1 VUS identified.  The CancerNext-Expanded gene panel offered by [facility] and includes sequencing and rearrangement analysis for the following 77 genes: AIP, ALK, APC*, ATM*, AXIN2, BAP1, BARD1, BLM, BMPR1A, BRCA1*, BRCA2*, BRIP1*, CDC73, CDH1*, CDK4, CDKN1B, CDKN2A, CHEK2*, CTNNA1, DICER1, FANCC, FH, FLCN, GALNT12, KIF1B, LZTR1, MAX, MEN1, MET, MLH1*, MSH2*, MSH3, MSH6*, MUTYH*, NBN, NF1*, NF2, NTHL1, PALB2*, PHOX2B, PMS2*, POT1, PRKAR1A, PTCH1, PTEN*, RAD51C*, RAD51D*, RB1, RECQL, RET, SDHA, SDHAF2, SDHB, SDHC, SDHD, SMAD4, SMARCA4, SMARCB1, SMARCE1, STK11, SUFU, TMEM127, TP53*, TSC1, TSC2, VHL and XRCC2 (sequencing and deletion/duplication); EGFR, EGLN1, HOXB13, KIT, MITF, PDGFRA, POLD1, and POLE (sequencing only); EPCAM and GREM1 (deletion/duplication only). DNA and RNA analyses performed for * genes. The report date is February 19, 2021. ?  ?02/27/2021 -  Chemotherapy  ? Patient is on Treatment Plan : BREAST Pembrolizumab + Carboplatin D1,8,15+ Paclitaxel D1,8,15 q21d X 4 cycles / Pembrolizumab + AC q21d x 4 cycles  ?   ?12/22/2021 Cancer Staging  ? Staging form: Breast, AJCC 8th Edition ?- Pathologic stage from 12/22/2021: No Stage Recommended (ypT3, pN3a, cM0, G2, ER-, PR-, HER2-) - Signed by Derek Jack, MD on 12/22/2021 ?Histopathologic type: Lobular carcinoma, NOS ?Stage prefix: Post-therapy ?Method of lymph node assessment: Axillary lymph node dissection ?Multigene prognostic tests  performed: None ?Histologic grading system: 3 grade system ? ?  ? ? ?CHIEF COMPLIANT: Follow-up of T3N3c (stage IIIc) triple negative invasive lobular carcinoma of the left breast ? ? ?INTERVAL HISTORY: Denise [name] is a 71 y.o. female here today for follow up of her T3N3c (stage IIIc) triple negative invasive lobular carcinoma of the left breast. Her last visit was on 10/22/2021.  ? ?Today she reports feeling good. She reports occasional numbness in her toes at night which is gone by morning; she denies associated pain. She reports numbness under her right arm which does not radiate down her arm. She reports episodes of vomiting and diarrhea; she has had 1 episode in the past 4 weeks.  She denies nausea following her previous chemotherapy treatments.  ? ?REVIEW OF SYSTEMS:   ?Review of Systems  ?Constitutional:  Negative for appetite change and fatigue.  ?Respiratory:  Positive for cough (allergies).   ?Gastrointestinal:  Positive for diarrhea and vomiting.  ?Neurological:  Positive for numbness (toes).  ?All other systems reviewed and are negative. ? ?I have reviewed the past medical history, past surgical history, social history and family history with the patient and they are unchanged from previous note. ? ? ?ALLERGIES:   ?is allergic to exforge [amlodipine besylate-valsartan], tape, cheese, levofloxacin in d5w, strawberry extract, tramadol hcl, and yeast-related products. ? ? ?MEDICATIONS:  ?Current Outpatient Medications  ?Medication Sig Dispense Refill  ? Acetaminophen (TYLENOL PO) Take 1 tablet by mouth as needed.    ? albuterol (VENTOLIN HFA) 108 (90 Base) MCG/ACT inhaler Inhale into the lungs.    ? benazepril (LOTENSIN) 20 MG tablet Take by mouth.    ? capecitabine (XELODA) 500 MG tablet Take 3 pills by mouth twice a day. [place]  days on with 7 days off. Repeat each cycle. 84 tablet 0  ? CARBOPLATIN IV Inject into the vein once a week.    ? doxepin (SINEQUAN) 10 MG capsule Take 10 mg by mouth at  bedtime.    ? filgrastim (NEUPOGEN) 480 MCG/0.8ML SOSY injection Inject 0.8 mLs (480 mcg total) into the skin daily as needed for up to 8 doses (self-administer subcutaneously as directed by Dr. Delton Coombes as needed for neutropenia). 6.4 mL 0  ? gabapentin (NEURONTIN) 300 MG capsule Take 1 capsule by mouth at bedtime.    ? hydrochlorothiazide (MICROZIDE) 12.5 MG capsule Take 12.5 mg by mouth daily.    ? levothyroxine (SYNTHROID) 75 MCG tablet Take 100 mcg by mouth daily before breakfast.    ? lidocaine-prilocaine (EMLA) cream Apply a small amount to port a cath site and cover with plastic wrap 1 hour prior to infusion appointments 30 g 3  ? LORazepam (ATIVAN) 0.5 MG tablet Take 0.5 mg by mouth daily as needed.    ? losartan (COZAAR) 50 MG tablet Take 50 mg by mouth daily.    ? magnesium oxide (MAG-OX) 400 MG tablet Take 1 tablet by mouth daily.    ? Melatonin 5 MG TABS Take 10 mg by mouth. As needed for sleep    ? Omega-3 Fatty Acids (FISH OIL PO) Take by mouth.    ? PACLITAXEL IV Inject into the vein once a week.    ? Pembrolizumab (KEYTRUDA IV) Inject into the vein every 21 ( twenty-one) days.    ? prochlorperazine (COMPAZINE) 10 MG tablet Take 1 tablet (10 mg total) by mouth every 6 (six) hours as needed for nausea or vomiting. 30 tablet 3  ? Tiotropium Bromide-Olodaterol (STIOLTO RESPIMAT) 2.5-2.5 MCG/ACT AERS Inhale 1-2 puffs into the lungs as directed.    ? ?No current facility-administered medications for this visit.  ? ? ? ?PHYSICAL EXAMINATION: ?Performance status (ECOG): 1 - Symptomatic but completely ambulatory ? ?Vitals:  ? 12/22/21 1100  ?BP: 138/81  ?Pulse: 81  ?Resp: 20  ?Temp: 98.2 ?F (36.8 ?C)  ?SpO2: 98%  ? ?Wt Readings from Last 3 Encounters:  ?11/20/21 180 lb (81.6 kg)  ?10/22/21 184 lb 3.2 oz (83.6 kg)  ?10/01/21 184 lb (83.5 kg)  ? ?Physical Exam ?Vitals reviewed.  ?Constitutional:   ?   Appearance: Normal appearance.  ?Cardiovascular:  ?   Rate and Rhythm: Normal rate and regular rhythm.  ?    Pulses: Normal pulses.  ?   Heart sounds: Normal heart sounds.  ?Pulmonary:  ?   Effort: Pulmonary effort is normal.  ?   Breath sounds: Normal breath sounds.  ?Chest:  ?Breasts: ?   Left: Absent. No skin change (mastectomy site).  ?Lymphadenopathy:  ?   Upper Body:  ?   Left upper body: No supraclavicular, axillary or pectoral adenopathy.  ?Neurological:  ?   General: No focal deficit present.  ?   Mental Status: She is alert and oriented to person, place, and time.  ?Psychiatric:     ?   Mood and Affect: Mood normal.     ?   Behavior: Behavior normal.  ? ? ?Breast Exam Chaperone: Thana Ates   ? ? ?LABORATORY DATA:  ?I have reviewed the data as listed ? ?  Latest Ref Rng & Units 11/18/2021  ?  3:13 PM 10/22/2021  ?  2:12 PM 10/01/2021  ?  9:54 AM  ?CMP  ?Glucose 70 - 99 mg/dL 84   108  97    ?BUN 8 - 23 mg/dL '16   21   17    ' ?Creatinine 0.44 - 1.00 mg/dL 0.86   0.85   0.67    ?Sodium 135 - 145 mmol/L 137   137   139    ?Potassium 3.5 - 5.1 mmol/L 3.8   3.4   3.7    ?Chloride 98 - 111 mmol/L 104   106   104    ?CO2 22 - 32 mmol/L '26   26   27    ' ?Calcium 8.9 - 10.3 mg/dL 9.3   8.9   8.8    ?Total Protein 6.5 - 8.1 g/dL  6.9   6.8    ?Total Bilirubin 0.3 - 1.2 mg/dL  0.5   0.4    ?Alkaline Phos 38 - 126 U/L  73   72    ?AST 15 - 41 U/L  25   22    ?ALT 0 - 44 U/L  20   19    ? ?No results found for: XTG626 ?Lab Results  ?Component Value Date  ? WBC 3.5 (L) 10/22/2021  ? HGB 10.2 (L) 10/22/2021  ? HCT 31.1 (L) 10/22/2021  ? MCV 102.3 (H) 10/22/2021  ? PLT 214 10/22/2021  ? NEUTROABS 1.5 (L) 10/22/2021  ? ? ?ASSESSMENT:  ?1.  T3N3c (stage IIIc) triple negative invasive lobular carcinoma of the left breast: ?- She felt lump in her left breast for more than 6 months, but thought it was secondary to fibrocystic disease which she had all her life.  When she started having pain, she reached out to Dr. Sherrie Sport. ?- She previously had mammogram machine malfunction and had severe pain and traumatized by that experience.  She was  having ultrasound of the breast every other year since then. ?- She was on hormone replacement therapy for close to 10 years (from age [age]). ?- Ultrasound of the left breast on 01/08/2021 showed hyper v

## 2021-12-23 ENCOUNTER — Other Ambulatory Visit (HOSPITAL_COMMUNITY): Payer: Self-pay

## 2021-12-23 MED ORDER — CAPECITABINE 500 MG PO TABS
1500.0000 mg | ORAL_TABLET | Freq: Two times a day (BID) | ORAL | 0 refills | Status: DC
  Filled 2021-12-23 – 2021-12-24 (×2): qty 84, 14d supply, fill #0

## 2021-12-24 ENCOUNTER — Other Ambulatory Visit (HOSPITAL_COMMUNITY): Payer: Self-pay

## 2021-12-24 NOTE — Telephone Encounter (Signed)
Oral Chemotherapy Pharmacist Encounter ? ?[facility] will deliver medication on 12/25/21, Denise [name] knows that plan is to get started on 01/01/22. ? ?Patient Education ?I spoke with patient for overview of new oral chemotherapy medication: Xeloda (capecitabine) for the treatment of stage IIIc in conjunction with pembrolizumub, planned duration of ~8 cycles. She is s/p neoadjuvant carbo/taxol/pembro and left mastectomy. Planned start 01/01/22.  ? ?Pt is doing well. Counseled patient on administration, dosing, side effects, monitoring, drug-food interactions, safe handling, storage, and disposal. ?Patient will take 3 tablets (1,500 mg total) by mouth 2 (two) times daily after a meal. Take for 14 days, then hold for 7 days. Repeat every 21 days. ? ?Side effects include but not limited to: diarrhea, hand-foot syndrome, mouth sores, edema, decreased wbc, fatigue, N/V ?Diarrhea: Suggested she pick some up to have on hand if needed. She knows to call if she has 4 or more loose stools per day ?Hand-foot syndrome: Recommended the use of [place] Smooth Extra Care 20 ?Mouth sores: Instructed her to call for magic mouthwash if needed ? ?Reviewed with patient importance of keeping a medication schedule and plan for any missed doses. ? ?After discussion with patient no patient barriers to medication adherence identified.  ? ?Ms. Raker voiced understanding and appreciation. All questions answered. Medication handout provided. ? ?Provided patient with Oral [facility] phone number. Patient knows to call the office with questions or concerns. Oral Chemotherapy Navigation Clinic will continue to follow. ? ?Darl Pikes, PharmD, BCPS, BCOP, CPP ?Hematology/Oncology Clinical Pharmacist Practitioner ?[place]/[facility]/AP Oral Chemotherapy Navigation Clinic ?[phone] ? ?12/24/2021 4:27 PM ? ?

## 2021-12-29 ENCOUNTER — Other Ambulatory Visit (HOSPITAL_COMMUNITY): Payer: Self-pay

## 2021-12-29 NOTE — Telephone Encounter (Signed)
Spoke to patient on 4/12 to schedule shipment of Capecitabine to patients home. Capecitabine was shipped on 12/24/21 for delivery 12/25/21.  Patient aware that she is not to start until 01/01/22. ? ?Dennison Nancy CPHT ?Specialty Pharmacy Patient Advocate ?[place] ?Phone [phone] ?Fax [phone] ?12/29/2021 11:58 AM ? ?

## 2022-01-01 ENCOUNTER — Inpatient Hospital Stay (HOSPITAL_COMMUNITY): Payer: Medicare Other

## 2022-01-01 VITALS — BP 129/83 | HR 59 | Temp 98.8°F | Resp 18

## 2022-01-01 DIAGNOSIS — Z79899 Other long term (current) drug therapy: Secondary | ICD-10-CM

## 2022-01-01 DIAGNOSIS — G629 Polyneuropathy, unspecified: Secondary | ICD-10-CM | POA: Diagnosis not present

## 2022-01-01 DIAGNOSIS — C50912 Malignant neoplasm of unspecified site of left female breast: Secondary | ICD-10-CM

## 2022-01-01 DIAGNOSIS — E039 Hypothyroidism, unspecified: Secondary | ICD-10-CM | POA: Diagnosis not present

## 2022-01-01 DIAGNOSIS — Z5112 Encounter for antineoplastic immunotherapy: Secondary | ICD-10-CM | POA: Diagnosis not present

## 2022-01-01 DIAGNOSIS — Z95828 Presence of other vascular implants and grafts: Secondary | ICD-10-CM

## 2022-01-01 LAB — CBC WITH DIFFERENTIAL/PLATELET
Abs Immature Granulocytes: 0.02 10*3/uL (ref 0.00–0.07)
Basophils Absolute: 0 10*3/uL (ref 0.0–0.1)
Basophils Relative: 0 %
Eosinophils Absolute: 0.2 10*3/uL (ref 0.0–0.5)
Eosinophils Relative: 5 %
HCT: 32.1 % — ABNORMAL LOW (ref 36.0–46.0)
Hemoglobin: 10.7 g/dL — ABNORMAL LOW (ref 12.0–15.0)
Immature Granulocytes: 0 %
Lymphocytes Relative: 38 %
Lymphs Abs: 1.8 10*3/uL (ref 0.7–4.0)
MCH: 34.2 pg — ABNORMAL HIGH (ref 26.0–34.0)
MCHC: 33.3 g/dL (ref 30.0–36.0)
MCV: 102.6 fL — ABNORMAL HIGH (ref 80.0–100.0)
Monocytes Absolute: 0.4 10*3/uL (ref 0.1–1.0)
Monocytes Relative: 9 %
Neutro Abs: 2.3 10*3/uL (ref 1.7–7.7)
Neutrophils Relative %: 48 %
Platelets: 210 10*3/uL (ref 150–400)
RBC: 3.13 MIL/uL — ABNORMAL LOW (ref 3.87–5.11)
RDW: 12 % (ref 11.5–15.5)
WBC: 4.8 10*3/uL (ref 4.0–10.5)
nRBC: 0 % (ref 0.0–0.2)

## 2022-01-01 LAB — COMPREHENSIVE METABOLIC PANEL
ALT: 18 U/L (ref 0–44)
AST: 21 U/L (ref 15–41)
Albumin: 3.7 g/dL (ref 3.5–5.0)
Alkaline Phosphatase: 66 U/L (ref 38–126)
Anion gap: 6 (ref 5–15)
BUN: 16 mg/dL (ref 8–23)
CO2: 26 mmol/L (ref 22–32)
Calcium: 8.9 mg/dL (ref 8.9–10.3)
Chloride: 106 mmol/L (ref 98–111)
Creatinine, Ser: 0.72 mg/dL (ref 0.44–1.00)
GFR, Estimated: >60 mL/min (ref >60)
Glucose, Bld: 117 mg/dL — ABNORMAL HIGH (ref 70–99)
Potassium: 3.4 mmol/L — ABNORMAL LOW (ref 3.5–5.1)
Sodium: 138 mmol/L (ref 135–145)
Total Bilirubin: 0.4 mg/dL (ref 0.3–1.2)
Total Protein: 7.1 g/dL (ref 6.5–8.1)

## 2022-01-01 LAB — MAGNESIUM: Magnesium: 1.8 mg/dL (ref 1.7–2.4)

## 2022-01-01 LAB — TSH: TSH: 1.688 u[IU]/mL (ref 0.350–4.500)

## 2022-01-01 MED ORDER — SODIUM CHLORIDE 0.9 % IV SOLN: Freq: Once | INTRAVENOUS | Status: AC

## 2022-01-01 MED ORDER — HEPARIN SOD (PORK) LOCK FLUSH 100 UNIT/ML IV SOLN
500.0000 [IU] | Freq: Once | INTRAVENOUS | Status: AC | PRN
  Administered 2022-01-01: 500 [IU]

## 2022-01-01 MED ORDER — SODIUM CHLORIDE 0.9 % IV SOLN
200.0000 mg | Freq: Once | INTRAVENOUS | Status: AC
  Administered 2022-01-01: 200 mg via INTRAVENOUS
  Filled 2022-01-01: qty 8

## 2022-01-01 MED ORDER — SODIUM CHLORIDE 0.9% FLUSH
10.0000 mL | INTRAVENOUS | Status: DC | PRN
  Administered 2022-01-01: 10 mL

## 2022-01-01 NOTE — Progress Notes (Signed)
Patient presents today for Keytruda infusion.  Patient is in satisfactory condition with no complaints voiced.  Vital signs are stable.  Labs reviewed and all are within treatment parameters.  We will proceed with treatment per MD orders.  ? ?Patient tolerated treatment well with no complaints voiced.  Patient left ambulatory in stable condition.  Vital signs stable at discharge.  Follow up as scheduled.    ?

## 2022-01-01 NOTE — Patient Instructions (Signed)
[facility]  Discharge Instructions: Thank you for choosing [facility] to provide your oncology and hematology care.  If you have a lab appointment with the Cancer Center, please come in thru the Main Entrance and check in at the main information desk.  Wear comfortable clothing and clothing appropriate for easy access to any Portacath or PICC line.   We strive to give you quality time with your provider. You may need to reschedule your appointment if you arrive late (15 or more minutes).  Arriving late affects you and other patients whose appointments are after yours.  Also, if you miss three or more appointments without notifying the office, you may be dismissed from the clinic at the provider's discretion.      For prescription refill requests, have your pharmacy contact our office and allow 72 hours for refills to be completed.        To help prevent nausea and vomiting after your treatment, we encourage you to take your nausea medication as directed.  BELOW ARE SYMPTOMS THAT SHOULD BE REPORTED IMMEDIATELY: *FEVER GREATER THAN 100.4 F (38 C) OR HIGHER *CHILLS OR SWEATING *NAUSEA AND VOMITING THAT IS NOT CONTROLLED WITH YOUR NAUSEA MEDICATION *UNUSUAL SHORTNESS OF BREATH *UNUSUAL BRUISING OR BLEEDING *URINARY PROBLEMS (pain or burning when urinating, or frequent urination) *BOWEL PROBLEMS (unusual diarrhea, constipation, pain near the anus) TENDERNESS IN MOUTH AND THROAT WITH OR WITHOUT PRESENCE OF ULCERS (sore throat, sores in mouth, or a toothache) UNUSUAL RASH, SWELLING OR PAIN  UNUSUAL VAGINAL DISCHARGE OR ITCHING   Items with * indicate a potential emergency and should be followed up as soon as possible or go to the Emergency Department if any problems should occur.  Please show the CHEMOTHERAPY ALERT CARD or IMMUNOTHERAPY ALERT CARD at check-in to the Emergency Department and triage nurse.  Should you have questions after your visit or need to cancel  or reschedule your appointment, please contact [facility] [phone]  and follow the prompts.  Office hours are 8:00 a.m. to 4:30 p.m. Monday - Friday. Please note that voicemails left after 4:00 p.m. may not be returned until the following business day.  We are closed weekends and major holidays. You have access to a nurse at all times for urgent questions. Please call the main number to the clinic [phone] and follow the prompts.  For any non-urgent questions, you may also contact your provider using MyChart. We now offer e-Visits for anyone 18 and older to request care online for non-urgent symptoms. For details visit [email or web address].   Also download the MyChart app! Go to the app store, search "MyChart", open the app, select [facility], and log in with your MyChart username and password.  Due to Covid, a mask is required upon entering the hospital/clinic. If you do not have a mask, one will be given to you upon arrival. For doctor visits, patients may have 1 support person aged 18 or older with them. For treatment visits, patients cannot have anyone with them due to current Covid guidelines and our immunocompromised population.  

## 2022-01-06 ENCOUNTER — Other Ambulatory Visit (HOSPITAL_COMMUNITY): Payer: Self-pay

## 2022-01-06 ENCOUNTER — Other Ambulatory Visit (HOSPITAL_COMMUNITY): Payer: Self-pay | Admitting: Hematology

## 2022-01-06 ENCOUNTER — Inpatient Hospital Stay (HOSPITAL_COMMUNITY): Payer: Medicare Other

## 2022-01-06 ENCOUNTER — Other Ambulatory Visit (HOSPITAL_COMMUNITY): Payer: Self-pay | Admitting: *Deleted

## 2022-01-06 ENCOUNTER — Inpatient Hospital Stay (HOSPITAL_BASED_OUTPATIENT_CLINIC_OR_DEPARTMENT_OTHER): Payer: Medicare Other | Admitting: Hematology

## 2022-01-06 DIAGNOSIS — C50912 Malignant neoplasm of unspecified site of left female breast: Secondary | ICD-10-CM | POA: Diagnosis not present

## 2022-01-06 DIAGNOSIS — E039 Hypothyroidism, unspecified: Secondary | ICD-10-CM | POA: Diagnosis not present

## 2022-01-06 DIAGNOSIS — Z79899 Other long term (current) drug therapy: Secondary | ICD-10-CM | POA: Diagnosis not present

## 2022-01-06 DIAGNOSIS — Z5112 Encounter for antineoplastic immunotherapy: Secondary | ICD-10-CM | POA: Diagnosis not present

## 2022-01-06 DIAGNOSIS — G629 Polyneuropathy, unspecified: Secondary | ICD-10-CM | POA: Diagnosis not present

## 2022-01-06 LAB — MAGNESIUM: Magnesium: 1.6 mg/dL — ABNORMAL LOW (ref 1.7–2.4)

## 2022-01-06 LAB — COMPREHENSIVE METABOLIC PANEL
ALT: 18 U/L (ref 0–44)
AST: 23 U/L (ref 15–41)
Albumin: 3.6 g/dL (ref 3.5–5.0)
Alkaline Phosphatase: 61 U/L (ref 38–126)
Anion gap: 6 (ref 5–15)
BUN: 17 mg/dL (ref 8–23)
CO2: 26 mmol/L (ref 22–32)
Calcium: 8.8 mg/dL — ABNORMAL LOW (ref 8.9–10.3)
Chloride: 110 mmol/L (ref 98–111)
Creatinine, Ser: 0.76 mg/dL (ref 0.44–1.00)
GFR, Estimated: >60 mL/min (ref >60)
Glucose, Bld: 118 mg/dL — ABNORMAL HIGH (ref 70–99)
Potassium: 3.5 mmol/L (ref 3.5–5.1)
Sodium: 142 mmol/L (ref 135–145)
Total Bilirubin: 0.6 mg/dL (ref 0.3–1.2)
Total Protein: 6.9 g/dL (ref 6.5–8.1)

## 2022-01-06 LAB — CBC WITH DIFFERENTIAL/PLATELET
Abs Immature Granulocytes: 0.01 10*3/uL (ref 0.00–0.07)
Basophils Absolute: 0 10*3/uL (ref 0.0–0.1)
Basophils Relative: 0 %
Eosinophils Absolute: 0.2 10*3/uL (ref 0.0–0.5)
Eosinophils Relative: 7 %
HCT: 32.3 % — ABNORMAL LOW (ref 36.0–46.0)
Hemoglobin: 10.8 g/dL — ABNORMAL LOW (ref 12.0–15.0)
Immature Granulocytes: 0 %
Lymphocytes Relative: 43 %
Lymphs Abs: 1.4 10*3/uL (ref 0.7–4.0)
MCH: 34.3 pg — ABNORMAL HIGH (ref 26.0–34.0)
MCHC: 33.4 g/dL (ref 30.0–36.0)
MCV: 102.5 fL — ABNORMAL HIGH (ref 80.0–100.0)
Monocytes Absolute: 0.3 10*3/uL (ref 0.1–1.0)
Monocytes Relative: 9 %
Neutro Abs: 1.3 10*3/uL — ABNORMAL LOW (ref 1.7–7.7)
Neutrophils Relative %: 41 %
Platelets: 174 10*3/uL (ref 150–400)
RBC: 3.15 MIL/uL — ABNORMAL LOW (ref 3.87–5.11)
RDW: 12 % (ref 11.5–15.5)
WBC: 3.1 10*3/uL — ABNORMAL LOW (ref 4.0–10.5)
nRBC: 0 % (ref 0.0–0.2)

## 2022-01-06 MED ORDER — HEPARIN SOD (PORK) LOCK FLUSH 100 UNIT/ML IV SOLN
500.0000 [IU] | Freq: Once | INTRAVENOUS | Status: AC
  Administered 2022-01-06: 500 [IU] via INTRAVENOUS

## 2022-01-06 MED ORDER — MAGNESIUM OXIDE 400 MG PO TABS: 400.0000 mg | ORAL_TABLET | Freq: Two times a day (BID) | ORAL | 6 refills | Status: DC

## 2022-01-06 MED ORDER — CAPECITABINE 500 MG PO TABS
1500.0000 mg | ORAL_TABLET | Freq: Two times a day (BID) | ORAL | 0 refills | Status: DC
  Filled 2022-01-08: qty 84, 21d supply, fill #0

## 2022-01-06 MED ORDER — SODIUM CHLORIDE 0.9% FLUSH
10.0000 mL | Freq: Once | INTRAVENOUS | Status: AC
Start: 2022-01-06 — End: 2022-01-06
  Administered 2022-01-06: 10 mL via INTRAVENOUS

## 2022-01-06 NOTE — Progress Notes (Signed)
Patients port flushed without difficulty.  Good blood return noted with no bruising or swelling noted at site.  Band aid applied.  VSS with discharge and left in satisfactory condition with no s/s of distress noted.   

## 2022-01-06 NOTE — Progress Notes (Signed)
? ?[facility] ?[address] ?[place], [place] [address] ? ? ?Patient Care Team: ?Neale Burly, MD as PCP - General (Internal Medicine) ?Derek Jack, MD as Medical Oncologist (Medical Oncology) ? ?SUMMARY OF ONCOLOGIC HISTORY: ?Oncology History  ?Invasive lobular carcinoma of left breast in female [facility])  ?01/23/2021 Initial Diagnosis  ? Invasive lobular carcinoma of left breast in female [facility]) ?  ?02/19/2021 Genetic Testing  ? Negative genetic testing on the CancerNext-Expanded+RNAinsight panel.  SMARCB1 VUS identified.  The CancerNext-Expanded gene panel offered by [facility] and includes sequencing and rearrangement analysis for the following 77 genes: AIP, ALK, APC*, ATM*, AXIN2, BAP1, BARD1, BLM, BMPR1A, BRCA1*, BRCA2*, BRIP1*, CDC73, CDH1*, CDK4, CDKN1B, CDKN2A, CHEK2*, CTNNA1, DICER1, FANCC, FH, FLCN, GALNT12, KIF1B, LZTR1, MAX, MEN1, MET, MLH1*, MSH2*, MSH3, MSH6*, MUTYH*, NBN, NF1*, NF2, NTHL1, PALB2*, PHOX2B, PMS2*, POT1, PRKAR1A, PTCH1, PTEN*, RAD51C*, RAD51D*, RB1, RECQL, RET, SDHA, SDHAF2, SDHB, SDHC, SDHD, SMAD4, SMARCA4, SMARCB1, SMARCE1, STK11, SUFU, TMEM127, TP53*, TSC1, TSC2, VHL and XRCC2 (sequencing and deletion/duplication); EGFR, EGLN1, HOXB13, KIT, MITF, PDGFRA, POLD1, and POLE (sequencing only); EPCAM and GREM1 (deletion/duplication only). DNA and RNA analyses performed for * genes. The report date is February 19, 2021. ?  ?02/27/2021 -  Chemotherapy  ? Patient is on Treatment Plan : BREAST Pembrolizumab + Carboplatin D1,8,15+ Paclitaxel D1,8,15 q21d X 4 cycles / Pembrolizumab + AC q21d x 4 cycles  ? ?  ?  ?12/22/2021 Cancer Staging  ? Staging form: Breast, AJCC 8th Edition ?- Pathologic stage from 12/22/2021: No Stage Recommended (ypT3, pN3a, cM0, G2, ER-, PR-, HER2-) - Signed by Derek Jack, MD on 12/22/2021 ?Histopathologic type: Lobular carcinoma, NOS ?Stage prefix: Post-therapy ?Method of lymph node assessment: Axillary lymph node dissection ?Multigene prognostic  tests performed: None ?Histologic grading system: 3 grade system ? ?  ? ? ?CHIEF COMPLIANT: Follow-up of T3N3c (stage IIIc) triple negative invasive lobular carcinoma of the left breast ? ? ?INTERVAL HISTORY: Denise [name] is a 71 y.o. female here today for follow up of her T3N3c (stage IIIc) triple negative invasive lobular carcinoma of the left breast. Her last visit was on 12/22/2021.  ? ?Today she reports feeling good. She started Xeloda on 4/20. She has not required Compazine. She denies nausea, diarrhea, mouth sores, and skin breakdown on her hands or feet. She reports 1 BM daily. She reports stable mild numbness in her toes at night. She continues to take magnesium.  ? ?REVIEW OF SYSTEMS:   ?Review of Systems  ?Constitutional:  Negative for appetite change and fatigue.  ?HENT:   Negative for mouth sores.   ?Gastrointestinal:  Negative for constipation, diarrhea and nausea.  ?Neurological:  Positive for numbness.  ?All other systems reviewed and are negative. ? ?I have reviewed the past medical history, past surgical history, social history and family history with the patient and they are unchanged from previous note. ? ? ?ALLERGIES:   ?is allergic to exforge [amlodipine besylate-valsartan], tape, cheese, levofloxacin in d5w, strawberry extract, tramadol hcl, and yeast-related products. ? ? ?MEDICATIONS:  ?Current Outpatient Medications  ?Medication Sig Dispense Refill  ? Acetaminophen (TYLENOL PO) Take 1 tablet by mouth as needed.    ? albuterol (VENTOLIN HFA) 108 (90 Base) MCG/ACT inhaler Inhale into the lungs.    ? benazepril (LOTENSIN) 20 MG tablet Take by mouth.    ? capecitabine (XELODA) 500 MG tablet Take 3 tablets (1,500 mg total) by mouth 2 (two) times daily after a meal. Take for 14 days, then  hold for 7 days. Repeat every 21 days. 84 tablet 0  ? filgrastim (NEUPOGEN) 480 MCG/0.8ML SOSY injection Inject 0.8 mLs (480 mcg total) into the skin daily as needed for up to 8 doses (self-administer  subcutaneously as directed by Dr. Delton Coombes as needed for neutropenia). 6.4 mL 0  ? gabapentin (NEURONTIN) 300 MG capsule Take 1 capsule by mouth at bedtime.    ? hydrochlorothiazide (MICROZIDE) 12.5 MG capsule Take 12.5 mg by mouth daily.    ? levothyroxine (SYNTHROID) 75 MCG tablet Take 100 mcg by mouth daily before breakfast.    ? lidocaine-prilocaine (EMLA) cream Apply a small amount to port a cath site and cover with plastic wrap 1 hour prior to infusion appointments 30 g 3  ? LORazepam (ATIVAN) 0.5 MG tablet Take 0.5 mg by mouth daily as needed.    ? losartan (COZAAR) 50 MG tablet Take 50 mg by mouth daily.    ? magnesium oxide (MAG-OX) 400 MG tablet Take 1 tablet by mouth daily.    ? Melatonin 5 MG TABS Take 10 mg by mouth. As needed for sleep    ? Omega-3 Fatty Acids (FISH OIL PO) Take by mouth.    ? Pembrolizumab (KEYTRUDA IV) Inject into the vein every 21 ( twenty-one) days.    ? Tiotropium Bromide-Olodaterol (STIOLTO RESPIMAT) 2.5-2.5 MCG/ACT AERS Inhale 1-2 puffs into the lungs as directed.    ? ?No current facility-administered medications for this visit.  ? ? ? ?PHYSICAL EXAMINATION: ?Performance status (ECOG): 1 - Symptomatic but completely ambulatory ? ?There were no vitals filed for this visit. ?Wt Readings from Last 3 Encounters:  ?01/06/22 182 lb 4.8 oz (82.7 kg)  ?01/01/22 183 lb 3.2 oz (83.1 kg)  ?11/20/21 180 lb (81.6 kg)  ? ?Physical Exam ?Vitals reviewed.  ?Constitutional:   ?   Appearance: Normal appearance.  ?Cardiovascular:  ?   Rate and Rhythm: Normal rate and regular rhythm.  ?   Pulses: Normal pulses.  ?   Heart sounds: Normal heart sounds.  ?Pulmonary:  ?   Effort: Pulmonary effort is normal.  ?   Breath sounds: Normal breath sounds.  ?Neurological:  ?   General: No focal deficit present.  ?   Mental Status: She is alert and oriented to person, place, and time.  ?Psychiatric:     ?   Mood and Affect: Mood normal.     ?   Behavior: Behavior normal.  ? ? ?Breast Exam Chaperone: Thana Ates   ? ? ?LABORATORY DATA:  ?I have reviewed the data as listed ? ?  Latest Ref Rng & Units 01/06/2022  ?  8:31 AM 01/01/2022  ? 12:12 PM 11/18/2021  ?  3:13 PM  ?CMP  ?Glucose 70 - 99 mg/dL 118   117   84    ?BUN 8 - 23 mg/dL '17   16   16    ' ?Creatinine 0.44 - 1.00 mg/dL 0.76   0.72   0.86    ?Sodium 135 - 145 mmol/L 142   138   137    ?Potassium 3.5 - 5.1 mmol/L 3.5   3.4   3.8    ?Chloride 98 - 111 mmol/L 110   106   104    ?CO2 22 - 32 mmol/L '26   26   26    ' ?Calcium 8.9 - 10.3 mg/dL 8.8   8.9   9.3    ?Total Protein 6.5 - 8.1 g/dL 6.9   7.1     ?  Total Bilirubin 0.3 - 1.2 mg/dL 0.6   0.4     ?Alkaline Phos 38 - 126 U/L 61   66     ?AST 15 - 41 U/L 23   21     ?ALT 0 - 44 U/L 18   18     ? ?No results found for: [id number] ?Lab Results  ?Component Value Date  ? WBC 3.1 (L) 01/06/2022  ? HGB 10.8 (L) 01/06/2022  ? HCT 32.3 (L) 01/06/2022  ? MCV 102.5 (H) 01/06/2022  ? PLT 174 01/06/2022  ? NEUTROABS 1.3 (L) 01/06/2022  ? ? ?ASSESSMENT:  ?1.  T3N3c (stage IIIc) triple negative invasive lobular carcinoma of the left breast: ?- She felt lump in her left breast for more than 6 months, but thought it was secondary to fibrocystic disease which she had all her life.  When she started having pain, she reached out to Dr. Sherrie Sport. ?- She previously had mammogram machine malfunction and had severe pain and traumatized by that experience.  She was having ultrasound of the breast every other year since then. ?- She was on hormone replacement therapy for close to 10 years (from age [age]). ?- Ultrasound of the left breast on 01/08/2021 showed hyper vascular hypoechoic mass at 12 o'clock position measuring 3.8 x 1.3 x 2.5 cm.  There are calcifications located within the mass.  Mass extends to the level of the skin with mild associated skin thickening.  At least 3 morphologically abnormal lymph nodes in the left axilla. ?- Mammogram showed irregular spiculated mass with associated calcifications in the retroareolar to upper left breast  measuring approximately 6 cm.  There are at least 4 morphologically abnormal lymph nodes identified in the left axilla. ?- Ultrasound-guided left breast and left axillary lymph node biopsy on 01/15/2021

## 2022-01-06 NOTE — Progress Notes (Signed)
Patient is taking Xeloda as prescribed.  She has not missed any doses and reports no side effects at this time.   

## 2022-01-06 NOTE — Telephone Encounter (Signed)
Prescription renewed for Xeloda.  Per previous ovn, patient is tolerating and is to continue on therapy. ?

## 2022-01-06 NOTE — Patient Instructions (Signed)
[place] [facility] ?Discharge Instructions ? ? ?You were seen and examined today by Dr. Delton Coombes. ? ?He reviewed your lab work.  All results were normal/stable except you magnesium.  Take magnesium 1 pill twice a day. ? ?We will proceed with your treatment today. ? ?Continue Xeloda as prescribed. ? ?Return as scheduled.  ? ? ?Thank you for choosing [place] at [facility] to provide your oncology and hematology care.  To afford each patient quality time with our provider, please arrive at least 15 minutes before your scheduled appointment time.  ? ?If you have a lab appointment with the [facility] please come in thru the Main Entrance and check in at the main information desk. ? ?You need to re-schedule your appointment should you arrive 10 or more minutes late.  We strive to give you quality time with our providers, and arriving late affects you and other patients whose appointments are after yours.  Also, if you no show three or more times for appointments you may be dismissed from the clinic at the providers discretion.     ?Again, thank you for choosing [facility].  Our hope is that these requests will decrease the amount of time that you wait before being seen by our physicians.       ?_____________________________________________________________ ? ?Should you have questions after your visit to [facility], please contact our office at [phone] and follow the prompts.  Our office hours are 8:00 a.m. and 4:30 p.m. Monday - Friday.  Please note that voicemails left after 4:00 p.m. may not be returned until the following business day.  We are closed weekends and major holidays.  You do have access to a nurse 24-7, just call the main number to the clinic [phone] and do not press any options, hold on the line and a nurse will answer the phone.   ? ?For prescription refill requests, have your pharmacy contact our office  and allow 72 hours.   ? ?Due to Covid, you will need to wear a mask upon entering the hospital. If you do not have a mask, a mask will be given to you at the Main Entrance upon arrival. For doctor visits, patients may have 1 support person age 47 or older with them. For treatment visits, patients can not have anyone with them due to social distancing guidelines and our immunocompromised population.  ? ?   ?

## 2022-01-08 ENCOUNTER — Other Ambulatory Visit (HOSPITAL_COMMUNITY): Payer: Self-pay

## 2022-01-10 DIAGNOSIS — Z20822 Contact with and (suspected) exposure to covid-19: Secondary | ICD-10-CM | POA: Diagnosis not present

## 2022-01-13 ENCOUNTER — Other Ambulatory Visit (HOSPITAL_COMMUNITY): Payer: Self-pay

## 2022-01-20 DIAGNOSIS — Z20822 Contact with and (suspected) exposure to covid-19: Secondary | ICD-10-CM | POA: Diagnosis not present

## 2022-01-22 ENCOUNTER — Inpatient Hospital Stay (HOSPITAL_COMMUNITY): Payer: Medicare Other | Attending: Hematology

## 2022-01-22 ENCOUNTER — Encounter (HOSPITAL_COMMUNITY): Payer: Self-pay

## 2022-01-22 ENCOUNTER — Inpatient Hospital Stay (HOSPITAL_COMMUNITY): Payer: Medicare Other

## 2022-01-22 VITALS — BP 126/75 | HR 64 | Temp 98.0°F | Resp 18

## 2022-01-22 DIAGNOSIS — C50912 Malignant neoplasm of unspecified site of left female breast: Secondary | ICD-10-CM

## 2022-01-22 DIAGNOSIS — Z170 Estrogen receptor positive status [ER+]: Secondary | ICD-10-CM | POA: Insufficient documentation

## 2022-01-22 DIAGNOSIS — Z79899 Other long term (current) drug therapy: Secondary | ICD-10-CM | POA: Insufficient documentation

## 2022-01-22 DIAGNOSIS — Z5112 Encounter for antineoplastic immunotherapy: Secondary | ICD-10-CM | POA: Insufficient documentation

## 2022-01-22 DIAGNOSIS — Z95828 Presence of other vascular implants and grafts: Secondary | ICD-10-CM

## 2022-01-22 LAB — COMPREHENSIVE METABOLIC PANEL
ALT: 18 U/L (ref 0–44)
AST: 22 U/L (ref 15–41)
Albumin: 4.1 g/dL (ref 3.5–5.0)
Alkaline Phosphatase: 73 U/L (ref 38–126)
Anion gap: 6 (ref 5–15)
BUN: 15 mg/dL (ref 8–23)
CO2: 26 mmol/L (ref 22–32)
Calcium: 9.3 mg/dL (ref 8.9–10.3)
Chloride: 105 mmol/L (ref 98–111)
Creatinine, Ser: 0.8 mg/dL (ref 0.44–1.00)
GFR, Estimated: >60 mL/min (ref >60)
Glucose, Bld: 108 mg/dL — ABNORMAL HIGH (ref 70–99)
Potassium: 3.6 mmol/L (ref 3.5–5.1)
Sodium: 137 mmol/L (ref 135–145)
Total Bilirubin: 0.4 mg/dL (ref 0.3–1.2)
Total Protein: 7.4 g/dL (ref 6.5–8.1)

## 2022-01-22 LAB — CBC WITH DIFFERENTIAL/PLATELET
Abs Immature Granulocytes: 0.01 10*3/uL (ref 0.00–0.07)
Basophils Absolute: 0 10*3/uL (ref 0.0–0.1)
Basophils Relative: 0 %
Eosinophils Absolute: 0.2 10*3/uL (ref 0.0–0.5)
Eosinophils Relative: 4 %
HCT: 33.6 % — ABNORMAL LOW (ref 36.0–46.0)
Hemoglobin: 11.4 g/dL — ABNORMAL LOW (ref 12.0–15.0)
Immature Granulocytes: 0 %
Lymphocytes Relative: 47 %
Lymphs Abs: 2 10*3/uL (ref 0.7–4.0)
MCH: 34.3 pg — ABNORMAL HIGH (ref 26.0–34.0)
MCHC: 33.9 g/dL (ref 30.0–36.0)
MCV: 101.2 fL — ABNORMAL HIGH (ref 80.0–100.0)
Monocytes Absolute: 0.5 10*3/uL (ref 0.1–1.0)
Monocytes Relative: 11 %
Neutro Abs: 1.6 10*3/uL — ABNORMAL LOW (ref 1.7–7.7)
Neutrophils Relative %: 38 %
Platelets: 192 10*3/uL (ref 150–400)
RBC: 3.32 MIL/uL — ABNORMAL LOW (ref 3.87–5.11)
RDW: 13.4 % (ref 11.5–15.5)
WBC: 4.3 10*3/uL (ref 4.0–10.5)
nRBC: 0 % (ref 0.0–0.2)

## 2022-01-22 LAB — MAGNESIUM: Magnesium: 1.9 mg/dL (ref 1.7–2.4)

## 2022-01-22 LAB — TSH: TSH: 2.189 u[IU]/mL (ref 0.350–4.500)

## 2022-01-22 MED ORDER — HEPARIN SOD (PORK) LOCK FLUSH 100 UNIT/ML IV SOLN
500.0000 [IU] | Freq: Once | INTRAVENOUS | Status: AC | PRN
  Administered 2022-01-22: 500 [IU]

## 2022-01-22 MED ORDER — SODIUM CHLORIDE 0.9 % IV SOLN: Freq: Once | INTRAVENOUS | Status: AC

## 2022-01-22 MED ORDER — SODIUM CHLORIDE 0.9 % IV SOLN
200.0000 mg | Freq: Once | INTRAVENOUS | Status: AC
  Administered 2022-01-22: 200 mg via INTRAVENOUS
  Filled 2022-01-22: qty 8

## 2022-01-22 MED ORDER — SODIUM CHLORIDE 0.9% FLUSH
10.0000 mL | INTRAVENOUS | Status: DC | PRN
  Administered 2022-01-22 (×2): 10 mL

## 2022-01-22 NOTE — Patient Instructions (Signed)
[facility]  Discharge Instructions: ?Thank you for choosing [facility] to provide your oncology and hematology care.  ?If you have a lab appointment with the Cancer Center, please come in thru the Main Entrance and check in at the main information desk. ? ?Wear comfortable clothing and clothing appropriate for easy access to any Portacath or PICC line.  ? ?We strive to give you quality time with your provider. You may need to reschedule your appointment if you arrive late (15 or more minutes).  Arriving late affects you and other patients whose appointments are after yours.  Also, if you miss three or more appointments without notifying the office, you may be dismissed from the clinic at the provider?s discretion.    ?  ?For prescription refill requests, have your pharmacy contact our office and allow 72 hours for refills to be completed.   ? ?Today you received the following chemotherapy and/or immunotherapy agents Keytruda, return as scheduled.  ?  ?To help prevent nausea and vomiting after your treatment, we encourage you to take your nausea medication as directed. ? ?BELOW ARE SYMPTOMS THAT SHOULD BE REPORTED IMMEDIATELY: ?*FEVER GREATER THAN 100.4 F (38 ?C) OR HIGHER ?*CHILLS OR SWEATING ?*NAUSEA AND VOMITING THAT IS NOT CONTROLLED WITH YOUR NAUSEA MEDICATION ?*UNUSUAL SHORTNESS OF BREATH ?*UNUSUAL BRUISING OR BLEEDING ?*URINARY PROBLEMS (pain or burning when urinating, or frequent urination) ?*BOWEL PROBLEMS (unusual diarrhea, constipation, pain near the anus) ?TENDERNESS IN MOUTH AND THROAT WITH OR WITHOUT PRESENCE OF ULCERS (sore throat, sores in mouth, or a toothache) ?UNUSUAL RASH, SWELLING OR PAIN  ?UNUSUAL VAGINAL DISCHARGE OR ITCHING  ? ?Items with * indicate a potential emergency and should be followed up as soon as possible or go to the Emergency Department if any problems should occur. ? ?Please show the CHEMOTHERAPY ALERT CARD or IMMUNOTHERAPY ALERT CARD at check-in to  the Emergency Department and triage nurse. ? ?Should you have questions after your visit or need to cancel or reschedule your appointment, please contact [facility] [phone]  and follow the prompts.  Office hours are 8:00 a.m. to 4:30 p.m. Monday - Friday. Please note that voicemails left after 4:00 p.m. may not be returned until the following business day.  We are closed weekends and major holidays. You have access to a nurse at all times for urgent questions. Please call the main number to the clinic [phone] and follow the prompts. ? ?For any non-urgent questions, you may also contact your provider using MyChart. We now offer e-Visits for anyone 18 and older to request care online for non-urgent symptoms. For details visit [email or web address]. ?  ?Also download the MyChart app! Go to the app store, search "MyChart", open the app, select , and log in with your MyChart username and password. ? ?Due to Covid, a mask is required upon entering the hospital/clinic. If you do not have a mask, one will be given to you upon arrival. For doctor visits, patients may have 1 support person aged 18 or older with them. For treatment visits, patients cannot have anyone with them due to current Covid guidelines and our immunocompromised population.  ?

## 2022-01-22 NOTE — Progress Notes (Signed)
Patient tolerated therapy with no complaints voiced.  Side effects with management reviewed with understanding verbalized.  Port site clean and dry with no bruising or swelling noted at site.  Good blood return noted before and after administration of therapy.  Band aid applied.  Patient left in satisfactory condition with VSS and no s/s of distress noted.  

## 2022-01-27 ENCOUNTER — Other Ambulatory Visit (HOSPITAL_COMMUNITY): Payer: Self-pay

## 2022-01-27 ENCOUNTER — Other Ambulatory Visit (HOSPITAL_COMMUNITY): Payer: Self-pay | Admitting: Hematology

## 2022-01-27 ENCOUNTER — Other Ambulatory Visit (HOSPITAL_COMMUNITY): Payer: Self-pay | Admitting: *Deleted

## 2022-01-27 DIAGNOSIS — C50912 Malignant neoplasm of unspecified site of left female breast: Secondary | ICD-10-CM

## 2022-01-27 MED ORDER — CAPECITABINE 500 MG PO TABS
1500.0000 mg | ORAL_TABLET | Freq: Two times a day (BID) | ORAL | 0 refills | Status: DC
  Filled 2022-02-03: qty 84, 21d supply, fill #0

## 2022-01-27 NOTE — Telephone Encounter (Signed)
Refill sent for Xeloda per request.  Per last ovn, patient tolerating and is to continue on therapy. ?

## 2022-01-29 ENCOUNTER — Encounter (HOSPITAL_COMMUNITY): Payer: Self-pay

## 2022-01-29 DIAGNOSIS — Z91011 Allergy to milk products: Secondary | ICD-10-CM | POA: Diagnosis not present

## 2022-01-29 DIAGNOSIS — N390 Urinary tract infection, site not specified: Secondary | ICD-10-CM | POA: Diagnosis not present

## 2022-01-29 DIAGNOSIS — Z91048 Other nonmedicinal substance allergy status: Secondary | ICD-10-CM | POA: Diagnosis not present

## 2022-01-29 DIAGNOSIS — Z9049 Acquired absence of other specified parts of digestive tract: Secondary | ICD-10-CM | POA: Diagnosis not present

## 2022-01-29 DIAGNOSIS — R519 Headache, unspecified: Secondary | ICD-10-CM | POA: Diagnosis not present

## 2022-01-29 DIAGNOSIS — Z79890 Hormone replacement therapy (postmenopausal): Secondary | ICD-10-CM | POA: Diagnosis not present

## 2022-01-29 DIAGNOSIS — Z885 Allergy status to narcotic agent status: Secondary | ICD-10-CM | POA: Diagnosis not present

## 2022-01-29 DIAGNOSIS — R509 Fever, unspecified: Secondary | ICD-10-CM | POA: Diagnosis not present

## 2022-01-29 DIAGNOSIS — E860 Dehydration: Secondary | ICD-10-CM | POA: Diagnosis not present

## 2022-01-29 DIAGNOSIS — Z882 Allergy status to sulfonamides status: Secondary | ICD-10-CM | POA: Diagnosis not present

## 2022-01-29 DIAGNOSIS — Z91018 Allergy to other foods: Secondary | ICD-10-CM | POA: Diagnosis not present

## 2022-01-29 DIAGNOSIS — Z888 Allergy status to other drugs, medicaments and biological substances status: Secondary | ICD-10-CM | POA: Diagnosis not present

## 2022-01-29 DIAGNOSIS — Z79899 Other long term (current) drug therapy: Secondary | ICD-10-CM | POA: Diagnosis not present

## 2022-01-29 DIAGNOSIS — Z9221 Personal history of antineoplastic chemotherapy: Secondary | ICD-10-CM | POA: Diagnosis not present

## 2022-01-29 DIAGNOSIS — K529 Noninfective gastroenteritis and colitis, unspecified: Secondary | ICD-10-CM | POA: Diagnosis not present

## 2022-01-29 DIAGNOSIS — Z90710 Acquired absence of both cervix and uterus: Secondary | ICD-10-CM | POA: Diagnosis not present

## 2022-01-29 DIAGNOSIS — Z853 Personal history of malignant neoplasm of breast: Secondary | ICD-10-CM | POA: Diagnosis not present

## 2022-01-29 DIAGNOSIS — R197 Diarrhea, unspecified: Secondary | ICD-10-CM | POA: Diagnosis not present

## 2022-01-29 DIAGNOSIS — E079 Disorder of thyroid, unspecified: Secondary | ICD-10-CM | POA: Diagnosis not present

## 2022-01-29 DIAGNOSIS — R112 Nausea with vomiting, unspecified: Secondary | ICD-10-CM | POA: Diagnosis not present

## 2022-01-29 DIAGNOSIS — Z881 Allergy status to other antibiotic agents status: Secondary | ICD-10-CM | POA: Diagnosis not present

## 2022-01-29 DIAGNOSIS — I10 Essential (primary) hypertension: Secondary | ICD-10-CM | POA: Diagnosis not present

## 2022-01-29 NOTE — Progress Notes (Signed)
Patient called reporting diarrhea, vomiting, and chills over night. She states that she feels better today but her stomach is still a bit uneasy, so she will not be taking the Xeloda. States she will start back on Monday. She states that her grandson, who she has been watching, had the stomach bug approximately 3 days ago. She also tells me that she was in a large group of adults and at least 25 children over the weekend. We discussed infection prevention. Dr. Delton Coombes made aware.

## 2022-02-02 DIAGNOSIS — N3090 Cystitis, unspecified without hematuria: Secondary | ICD-10-CM | POA: Diagnosis not present

## 2022-02-02 DIAGNOSIS — E860 Dehydration: Secondary | ICD-10-CM | POA: Diagnosis not present

## 2022-02-03 ENCOUNTER — Other Ambulatory Visit (HOSPITAL_COMMUNITY): Payer: Self-pay

## 2022-02-10 NOTE — Progress Notes (Signed)
[name], [place] [address]   CLINIC:  Medical Oncology/Hematology  PCP:  Neale Burly, MD [place] / [place] [address] [phone]   REASON FOR VISIT:  Follow-up for T3N3c (stage IIIc) triple negative invasive lobular carcinoma of the left breast  PRIOR THERAPY: Left mastectomy and lymph node excision by Dr. Ninfa Linden on 11/20/2021  NGS Results: not done  CURRENT THERAPY: Pembrolizumab + Carboplatin D1,8,15+ Paclitaxel D1,8,15 q21d X 4 cycles / Pembrolizumab + AC q21d x 4 cycles  BRIEF ONCOLOGIC HISTORY:  Oncology History  Invasive lobular carcinoma of left breast in female [facility])  01/23/2021 Initial Diagnosis   Invasive lobular carcinoma of left breast in female [facility])   02/19/2021 Genetic Testing   Negative genetic testing on the CancerNext-Expanded+RNAinsight panel.  SMARCB1 VUS identified.  The CancerNext-Expanded gene panel offered by [facility] and includes sequencing and rearrangement analysis for the following 77 genes: AIP, ALK, APC*, ATM*, AXIN2, BAP1, BARD1, BLM, BMPR1A, BRCA1*, BRCA2*, BRIP1*, CDC73, CDH1*, CDK4, CDKN1B, CDKN2A, CHEK2*, CTNNA1, DICER1, FANCC, FH, FLCN, GALNT12, KIF1B, LZTR1, MAX, MEN1, MET, MLH1*, MSH2*, MSH3, MSH6*, MUTYH*, NBN, NF1*, NF2, NTHL1, PALB2*, PHOX2B, PMS2*, POT1, PRKAR1A, PTCH1, PTEN*, RAD51C*, RAD51D*, RB1, RECQL, RET, SDHA, SDHAF2, SDHB, SDHC, SDHD, SMAD4, SMARCA4, SMARCB1, SMARCE1, STK11, SUFU, TMEM127, TP53*, TSC1, TSC2, VHL and XRCC2 (sequencing and deletion/duplication); EGFR, EGLN1, HOXB13, KIT, MITF, PDGFRA, POLD1, and POLE (sequencing only); EPCAM and GREM1 (deletion/duplication only). DNA and RNA analyses performed for * genes. The report date is February 19, 2021.   02/27/2021 -  Chemotherapy   Patient is on Treatment Plan : BREAST Pembrolizumab + Carboplatin D1,8,15+ Paclitaxel D1,8,15 q21d X 4 cycles / Pembrolizumab + AC q21d x 4 cycles      12/22/2021 Cancer Staging   Staging form:  Breast, AJCC 8th Edition - Pathologic stage from 12/22/2021: No Stage Recommended (ypT3, pN3a, cM0, G2, ER-, PR-, HER2-) - Signed by Derek Jack, MD on 12/22/2021 Histopathologic type: Lobular carcinoma, NOS Stage prefix: Post-therapy Method of lymph node assessment: Axillary lymph node dissection Multigene prognostic tests performed: None Histologic grading system: 3 grade system      CANCER STAGING:  Cancer Staging  Invasive lobular carcinoma of left breast in female [facility]) Staging form: Breast, AJCC 8th Edition - Clinical stage from 01/23/2021: cT3, cN3c, G2, ER-, PR-, HER2- - Unsigned - Pathologic stage from 12/22/2021: No Stage Recommended (ypT3, pN3a, cM0, G2, ER-, PR-, HER2-) - Signed by Derek Jack, MD on 12/22/2021   INTERVAL HISTORY:  [name], a 71 y.o. female, returns for routine follow-up and consideration for next cycle of chemotherapy. Tayen was last seen on 01/06/2022.  Due for cycle #11 of Keytruda today.   Overall, she tells me she has been feeling pretty well. ***  Overall, she feels ready for next cycle of chemo today.    REVIEW OF SYSTEMS:  Review of Systems  All other systems reviewed and are negative.  PAST MEDICAL/SURGICAL HISTORY:  Past Medical History:  Diagnosis Date   Asthma    as child   Cancer ([place]) 12/2020   left breast IMC   Complication of anesthesia    pateitn states,' I coded when I had my D&Cmany years ago.   Dyspnea    Family history of breast cancer    Hypertension    Hypothyroidism    PONV (postoperative nausea and vomiting)    Port-A-Cath in place 02/26/2021   Past Surgical History:  Procedure Laterality  Date   ABDOMINAL HYSTERECTOMY     BIOPSY  01/17/2018   Procedure: BIOPSY;  Surgeon: Rogene Houston, MD;  [facility];  Service: Endoscopy;;  duodenum,gastric   CHOLECYSTECTOMY     COLONOSCOPY WITH PROPOFOL N/A 01/17/2018   Procedure: COLONOSCOPY WITH PROPOFOL;  Surgeon: Rogene Houston, MD;  [facility];  Service: Endoscopy;  Laterality: N/A;  7:30   DILATION AND CURETTAGE OF UTERUS     ESOPHAGOGASTRODUODENOSCOPY (EGD) WITH PROPOFOL N/A 01/17/2018   Procedure: ESOPHAGOGASTRODUODENOSCOPY (EGD) WITH PROPOFOL;  Surgeon: Rogene Houston, MD;  [facility];  Service: Endoscopy;  Laterality: N/A;   POLYPECTOMY  01/17/2018   Procedure: POLYPECTOMY;  Surgeon: Rogene Houston, MD;  [facility];  Service: Endoscopy;;  transverse colon, cecal   PORTACATH PLACEMENT Right 02/17/2021   Procedure: INSERTION PORT-A-CATH;  Surgeon: Coralie Keens, MD;  Location: [place];  Service: General;  Laterality: Right;   RADIOACTIVE SEED GUIDED AXILLARY SENTINEL LYMPH NODE Left 11/20/2021   Procedure: RADIOACTIVE SEED GUIDED LEFT AXILLARY SENTINEL LYMPH NODE DISSECTION;  Surgeon: Coralie Keens, MD;  Location: [place];  Service: General;  Laterality: Left;   TOTAL MASTECTOMY Left 11/20/2021   Procedure: LEFT TOTAL MASTECTOMY;  Surgeon: Coralie Keens, MD;  Location: [place];  Service: General;  Laterality: Left;    SOCIAL HISTORY:  Social History   Socioeconomic History   Marital status: Widowed    Spouse name: Not on file   Number of children: 3   Years of education: Not on file   Highest education level: Not on file  Occupational History   Occupation: [employer]   Occupation: retired  Tobacco Use   Smoking status: Never   Smokeless tobacco: Never  Scientific laboratory technician Use: Never used  Substance and Sexual Activity   Alcohol use: Never   Drug use: Never   Sexual activity: Not Currently    Birth control/protection: Surgical  Other Topics Concern   Not on file  Social History Narrative   Not on file   Social Determinants of Health   Financial Resource Strain: Not on file  Food Insecurity: Not on file  Transportation Needs: Not on file  Physical Activity: Not on file   Stress: Not on file  Social Connections: Not on file  Intimate Partner Violence: Not on file    FAMILY HISTORY:  Family History  Problem Relation Age of Onset   Breast cancer Mother        dx in her 10s   Heart disease Mother    Thyroid disease Mother    Lung cancer Father        dx in his 75s   Thyroid disease Maternal Aunt    Thyroid nodules Maternal Grandmother 35       goiter   Thyroid disease Maternal Grandmother    Heart disease Maternal Grandfather    Heart disease Paternal Grandmother    Heart disease Paternal Grandfather    Thyroid disease Daughter    Thyroid disease Daughter    Cancer Maternal Uncle        NOS   Cancer Paternal Uncle        NOS   Breast cancer Cousin        pat first cousin died in her 93s;     CURRENT MEDICATIONS:  Current Outpatient Medications  Medication Sig Dispense Refill   Acetaminophen (TYLENOL PO) Take 1  tablet by mouth as needed.     albuterol (VENTOLIN HFA) 108 (90 Base) MCG/ACT inhaler Inhale into the lungs.     benazepril (LOTENSIN) 20 MG tablet Take by mouth.     capecitabine (XELODA) 500 MG tablet Take 3 tablets (1,500 mg total) by mouth 2 (two) times daily after a meal. Take for 14 days, then hold for 7 days. Repeat every 21 days. 84 tablet 0   filgrastim (NEUPOGEN) 480 MCG/0.8ML SOSY injection Inject 0.8 mLs (480 mcg total) into the skin daily as needed for up to 8 doses (self-administer subcutaneously as directed by Dr. Delton Coombes as needed for neutropenia). 6.4 mL 0   gabapentin (NEURONTIN) 300 MG capsule Take 1 capsule by mouth at bedtime.     hydrochlorothiazide (MICROZIDE) 12.5 MG capsule Take 12.5 mg by mouth daily.     levothyroxine (SYNTHROID) 75 MCG tablet Take 100 mcg by mouth daily before breakfast.     lidocaine-prilocaine (EMLA) cream Apply a small amount to port a cath site and cover with plastic wrap 1 hour prior to infusion appointments 30 g 3   LORazepam (ATIVAN) 0.5 MG tablet Take 0.5 mg by mouth daily as  needed.     losartan (COZAAR) 50 MG tablet Take 50 mg by mouth daily.     magnesium oxide (MAG-OX) 400 MG tablet Take 1 tablet (400 mg total) by mouth 2 (two) times daily. 60 tablet 6   Melatonin 5 MG TABS Take 10 mg by mouth. As needed for sleep     Omega-3 Fatty Acids (FISH OIL PO) Take by mouth.     Pembrolizumab (KEYTRUDA IV) Inject into the vein every 21 ( twenty-one) days.     Tiotropium Bromide-Olodaterol (STIOLTO RESPIMAT) 2.5-2.5 MCG/ACT AERS Inhale 1-2 puffs into the lungs as directed.     No current facility-administered medications for this visit.    ALLERGIES:  Allergies  Allergen Reactions   Exforge [Amlodipine Besylate-Valsartan] Nausea And Vomiting   Tape Other (See Comments)    Redness and Irriatation   Cheese     Hard cheese   Levofloxacin In D5w Hives   Strawberry Extract Diarrhea    Seeds,nuts, lettuce, grapes   Tramadol Hcl Nausea And Vomiting   Yeast-Related Products Hives    Mold on bread    PHYSICAL EXAM:  Performance status (ECOG): 1 - Symptomatic but completely ambulatory  There were no vitals filed for this visit. Wt Readings from Last 3 Encounters:  01/06/22 182 lb 4.8 oz (82.7 kg)  01/01/22 183 lb 3.2 oz (83.1 kg)  11/20/21 180 lb (81.6 kg)   Physical Exam  LABORATORY DATA:  I have reviewed the labs as listed.     Latest Ref Rng & Units 01/22/2022   12:14 PM 01/06/2022    8:31 AM 01/01/2022   12:12 PM  CBC  WBC 4.0 - 10.5 K/uL 4.3   3.1   4.8    Hemoglobin 12.0 - 15.0 g/dL 11.4   10.8   10.7    Hematocrit 36.0 - 46.0 % 33.6   32.3   32.1    Platelets 150 - 400 K/uL 192   174   210        Latest Ref Rng & Units 01/22/2022   12:14 PM 01/06/2022    8:31 AM 01/01/2022   12:12 PM  CMP  Glucose 70 - 99 mg/dL 108   118   117    BUN 8 - 23 mg/dL 15   17  16    Creatinine 0.44 - 1.00 mg/dL 0.80   0.76   0.72    Sodium 135 - 145 mmol/L 137   142   138    Potassium 3.5 - 5.1 mmol/L 3.6   3.5   3.4    Chloride 98 - 111 mmol/L 105   110   106     CO2 22 - 32 mmol/L '26   26   26    ' Calcium 8.9 - 10.3 mg/dL 9.3   8.8   8.9    Total Protein 6.5 - 8.1 g/dL 7.4   6.9   7.1    Total Bilirubin 0.3 - 1.2 mg/dL 0.4   0.6   0.4    Alkaline Phos 38 - 126 U/L 73   61   66    AST 15 - 41 U/L '22   23   21    ' ALT 0 - 44 U/L '18   18   18      ' DIAGNOSTIC IMAGING:  I have independently reviewed the scans and discussed with the patient. No results found.   ASSESSMENT:  1.  T3N3c (stage IIIc) triple negative invasive lobular carcinoma of the left breast: - She felt lump in her left breast for more than 6 months, but thought it was secondary to fibrocystic disease which she had all her life.  When she started having pain, she reached out to Dr. Sherrie Sport. - She previously had mammogram machine malfunction and had severe pain and traumatized by that experience.  She was having ultrasound of the breast every other year since then. - She was on hormone replacement therapy for close to 10 years (from age [age]). - Ultrasound of the left breast on 01/08/2021 showed hyper vascular hypoechoic mass at 12 o'clock position measuring 3.8 x 1.3 x 2.5 cm.  There are calcifications located within the mass.  Mass extends to the level of the skin with mild associated skin thickening.  At least 3 morphologically abnormal lymph nodes in the left axilla. - Mammogram showed irregular spiculated mass with associated calcifications in the retroareolar to upper left breast measuring approximately 6 cm.  There are at least 4 morphologically abnormal lymph nodes identified in the left axilla. - Ultrasound-guided left breast and left axillary lymph node biopsy on 01/15/2021 - Pathology consistent with invasive lobular carcinoma, E-cadherin negative.  ER/PR/HER2 negative.  HER2 2+ by IHC, negative by FISH.  Ki-67 is 5%.  Lymph node core biopsy was consistent with metastatic carcinoma.  Grade 2. - PET scan on 02/03/2021 showed involvement of left supraclavicular, subpectoral, axillary  lymph nodes along with the breast mass.  10 mm left lung nodule which is hypometabolic.  Spinal cord lesion at T12 level with a strong uptake. - MRI of the lumbar spine with and without contrast on 02/20/2021 showed no mass or abnormal enhancement within the canal at the T12 level to correspond to the site of PET scan positive.  No marrow replacing bone lesion. - 2D echo on 02/21/2021 with EF 55-60%. - Weekly carboplatin and paclitaxel with every 3 weeks pembrolizumab ([id number]) started on 02/27/2021. - Cycle 1 of AC on 07/04/2021.  Admission to [facility] with neutropenic fever. - Cycle 2 AC with 50% dose reduction on 08/21/2021. - 4 cycles of AC completed on 10/01/2021. - PET scan on 10/16/2021: Reduction in metabolic activity in the size of the left axillary lymph node and no evidence of breast hypermetabolism on PET scan. - Left mastectomy  and lymph node excision by Dr. Ninfa Linden on 11/20/2021 - Pathology: 6.2 cm invasive lobular carcinoma, grade 2, margins negative.  19/21 lymph nodes involved with macrometastasis.  ypT3, YPN3A - Adjuvant pembrolizumab started on 01/01/2022. -CREATE-X capecitabine 1500 mg twice daily 2 weeks on/1 week off started on 01/01/2022.     2.  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  She is non-smoker. - Mother died of breast cancer.  Maternal grandmother died very young in her 53s, sister has fibrocystic disease.  Father died of lung cancer and was a smoker.   PLAN:  1.  T3N3c triple negative invasive lobular carcinoma of the left breast: - She has started pembrolizumab and capecitabine on 01/01/2022. - Denied any nausea vomiting or diarrhea.  No worsening of tingling or numbness. - Reviewed labs today which showed normal LFTs.  White count is 3.1 with ANC of 1.3.  Platelet count is normal.  Microcytic anemia with hemoglobin 10.8. - She will continue Xeloda 3 tablets twice daily.  We talked about HFSR  precautions. - She will come back for cycle 2 on 01/22/2022. - I will reevaluate her in the clinic in 4 weeks.  2.  Hypothyroidism: - Continue Synthroid 75 mcg daily.  3.  Peripheral neuropathy: - Numbness in the toes at nighttime predominantly.  No pains noted.  4.  Hypomagnesemia: - She is taking magnesium once daily.  Magnesium is 1.6. - Increase magnesium to twice daily.   Orders placed this encounter:  No orders of the defined types were placed in this encounter.    Derek Jack, MD [place] [phone]   I, Thana Ates, am acting as a scribe for Dr. Derek Jack.  {Add Barista Statement}

## 2022-02-12 ENCOUNTER — Inpatient Hospital Stay (HOSPITAL_COMMUNITY): Payer: Medicare Other

## 2022-02-12 ENCOUNTER — Inpatient Hospital Stay (HOSPITAL_COMMUNITY): Payer: Medicare Other | Attending: Hematology | Admitting: Hematology

## 2022-02-12 VITALS — BP 129/76 | HR 59 | Temp 98.2°F | Resp 18

## 2022-02-12 DIAGNOSIS — Z95828 Presence of other vascular implants and grafts: Secondary | ICD-10-CM

## 2022-02-12 DIAGNOSIS — I10 Essential (primary) hypertension: Secondary | ICD-10-CM | POA: Diagnosis not present

## 2022-02-12 DIAGNOSIS — C50912 Malignant neoplasm of unspecified site of left female breast: Secondary | ICD-10-CM

## 2022-02-12 DIAGNOSIS — E039 Hypothyroidism, unspecified: Secondary | ICD-10-CM | POA: Diagnosis not present

## 2022-02-12 DIAGNOSIS — Z79899 Other long term (current) drug therapy: Secondary | ICD-10-CM | POA: Diagnosis not present

## 2022-02-12 DIAGNOSIS — G629 Polyneuropathy, unspecified: Secondary | ICD-10-CM | POA: Diagnosis not present

## 2022-02-12 DIAGNOSIS — Z5112 Encounter for antineoplastic immunotherapy: Secondary | ICD-10-CM | POA: Diagnosis not present

## 2022-02-12 DIAGNOSIS — Z171 Estrogen receptor negative status [ER-]: Secondary | ICD-10-CM | POA: Diagnosis not present

## 2022-02-12 DIAGNOSIS — C773 Secondary and unspecified malignant neoplasm of axilla and upper limb lymph nodes: Secondary | ICD-10-CM | POA: Diagnosis not present

## 2022-02-12 LAB — CBC WITH DIFFERENTIAL/PLATELET
Abs Immature Granulocytes: 0.01 10*3/uL (ref 0.00–0.07)
Basophils Absolute: 0 10*3/uL (ref 0.0–0.1)
Basophils Relative: 0 %
Eosinophils Absolute: 0.2 10*3/uL (ref 0.0–0.5)
Eosinophils Relative: 5 %
HCT: 33.3 % — ABNORMAL LOW (ref 36.0–46.0)
Hemoglobin: 11.2 g/dL — ABNORMAL LOW (ref 12.0–15.0)
Immature Granulocytes: 0 %
Lymphocytes Relative: 41 %
Lymphs Abs: 1.6 10*3/uL (ref 0.7–4.0)
MCH: 34.8 pg — ABNORMAL HIGH (ref 26.0–34.0)
MCHC: 33.6 g/dL (ref 30.0–36.0)
MCV: 103.4 fL — ABNORMAL HIGH (ref 80.0–100.0)
Monocytes Absolute: 0.4 10*3/uL (ref 0.1–1.0)
Monocytes Relative: 10 %
Neutro Abs: 1.8 10*3/uL (ref 1.7–7.7)
Neutrophils Relative %: 44 %
Platelets: 196 10*3/uL (ref 150–400)
RBC: 3.22 MIL/uL — ABNORMAL LOW (ref 3.87–5.11)
RDW: 14.3 % (ref 11.5–15.5)
WBC: 4 10*3/uL (ref 4.0–10.5)
nRBC: 0 % (ref 0.0–0.2)

## 2022-02-12 LAB — COMPREHENSIVE METABOLIC PANEL
ALT: 17 U/L (ref 0–44)
AST: 21 U/L (ref 15–41)
Albumin: 3.8 g/dL (ref 3.5–5.0)
Alkaline Phosphatase: 68 U/L (ref 38–126)
Anion gap: 1 — ABNORMAL LOW (ref 5–15)
BUN: 16 mg/dL (ref 8–23)
CO2: 29 mmol/L (ref 22–32)
Calcium: 8.7 mg/dL — ABNORMAL LOW (ref 8.9–10.3)
Chloride: 106 mmol/L (ref 98–111)
Creatinine, Ser: 0.82 mg/dL (ref 0.44–1.00)
GFR, Estimated: >60 mL/min (ref >60)
Glucose, Bld: 98 mg/dL (ref 70–99)
Potassium: 4 mmol/L (ref 3.5–5.1)
Sodium: 136 mmol/L (ref 135–145)
Total Bilirubin: 0.5 mg/dL (ref 0.3–1.2)
Total Protein: 7.1 g/dL (ref 6.5–8.1)

## 2022-02-12 LAB — MAGNESIUM: Magnesium: 1.7 mg/dL (ref 1.7–2.4)

## 2022-02-12 LAB — TSH: TSH: 3.655 u[IU]/mL (ref 0.350–4.500)

## 2022-02-12 MED ORDER — SODIUM CHLORIDE 0.9 % IV SOLN
200.0000 mg | Freq: Once | INTRAVENOUS | Status: AC
  Administered 2022-02-12: 200 mg via INTRAVENOUS
  Filled 2022-02-12: qty 8

## 2022-02-12 MED ORDER — HEPARIN SOD (PORK) LOCK FLUSH 100 UNIT/ML IV SOLN
500.0000 [IU] | Freq: Once | INTRAVENOUS | Status: AC | PRN
  Administered 2022-02-12: 500 [IU]

## 2022-02-12 MED ORDER — SODIUM CHLORIDE 0.9 % IV SOLN: Freq: Once | INTRAVENOUS | Status: AC

## 2022-02-12 MED ORDER — SODIUM CHLORIDE 0.9% FLUSH
10.0000 mL | INTRAVENOUS | Status: DC | PRN
  Administered 2022-02-12: 10 mL

## 2022-02-12 NOTE — Patient Instructions (Signed)
[place] at [facility] Discharge Instructions  You were seen and examined today by Dr. Delton Coombes.  Dr. Delton Coombes discussed your most recent lab work which are stable.  Continue Keytruda.  Continue Xeloda. Be sure you are taking it every 10 to 12 hours. Do not take sooner than 10 hours.  Follow-up as scheduled.    Thank you for choosing [place] at [facility] to provide your oncology and hematology care.  To afford each patient quality time with our provider, please arrive at least 15 minutes before your scheduled appointment time.   If you have a lab appointment with the [facility] please come in thru the Main Entrance and check in at the main information desk.  You need to re-schedule your appointment should you arrive 10 or more minutes late.  We strive to give you quality time with our providers, and arriving late affects you and other patients whose appointments are after yours.  Also, if you no show three or more times for appointments you may be dismissed from the clinic at the providers discretion.     Again, thank you for choosing [facility].  Our hope is that these requests will decrease the amount of time that you wait before being seen by our physicians.       _____________________________________________________________  Should you have questions after your visit to [facility], please contact our office at [phone] and follow the prompts.  Our office hours are 8:00 a.m. and 4:30 p.m. Monday - Friday.  Please note that voicemails left after 4:00 p.m. may not be returned until the following business day.  We are closed weekends and major holidays.  You do have access to a nurse 24-7, just call the main number to the clinic [phone] and do not press any options, hold on the line and a nurse will answer the phone.    For prescription refill requests, have your pharmacy contact our office  and allow 72 hours.    Due to Covid, you will need to wear a mask upon entering the hospital. If you do not have a mask, a mask will be given to you at the Main Entrance upon arrival. For doctor visits, patients may have 1 support person age 73 or older with them. For treatment visits, patients can not have anyone with them due to social distancing guidelines and our immunocompromised population.

## 2022-02-12 NOTE — Patient Instructions (Signed)
[facility]  Discharge Instructions: Thank you for choosing [facility] to provide your oncology and hematology care.  If you have a lab appointment with the Cancer Center, please come in thru the Main Entrance and check in at the main information desk.  Wear comfortable clothing and clothing appropriate for easy access to any Portacath or PICC line.   We strive to give you quality time with your provider. You may need to reschedule your appointment if you arrive late (15 or more minutes).  Arriving late affects you and other patients whose appointments are after yours.  Also, if you miss three or more appointments without notifying the office, you may be dismissed from the clinic at the provider's discretion.      For prescription refill requests, have your pharmacy contact our office and allow 72 hours for refills to be completed.    Today you received the following chemotherapy and/or immunotherapy agents Keytruda       To help prevent nausea and vomiting after your treatment, we encourage you to take your nausea medication as directed.  BELOW ARE SYMPTOMS THAT SHOULD BE REPORTED IMMEDIATELY: *FEVER GREATER THAN 100.4 F (38 C) OR HIGHER *CHILLS OR SWEATING *NAUSEA AND VOMITING THAT IS NOT CONTROLLED WITH YOUR NAUSEA MEDICATION *UNUSUAL SHORTNESS OF BREATH *UNUSUAL BRUISING OR BLEEDING *URINARY PROBLEMS (pain or burning when urinating, or frequent urination) *BOWEL PROBLEMS (unusual diarrhea, constipation, pain near the anus) TENDERNESS IN MOUTH AND THROAT WITH OR WITHOUT PRESENCE OF ULCERS (sore throat, sores in mouth, or a toothache) UNUSUAL RASH, SWELLING OR PAIN  UNUSUAL VAGINAL DISCHARGE OR ITCHING   Items with * indicate a potential emergency and should be followed up as soon as possible or go to the Emergency Department if any problems should occur.  Please show the CHEMOTHERAPY ALERT CARD or IMMUNOTHERAPY ALERT CARD at check-in to the Emergency  Department and triage nurse.  Should you have questions after your visit or need to cancel or reschedule your appointment, please contact [facility] [phone]  and follow the prompts.  Office hours are 8:00 a.m. to 4:30 p.m. Monday - Friday. Please note that voicemails left after 4:00 p.m. may not be returned until the following business day.  We are closed weekends and major holidays. You have access to a nurse at all times for urgent questions. Please call the main number to the clinic [phone] and follow the prompts.  For any non-urgent questions, you may also contact your provider using MyChart. We now offer e-Visits for anyone 18 and older to request care online for non-urgent symptoms. For details visit [email or web address].   Also download the MyChart app! Go to the app store, search "MyChart", open the app, select [facility], and log in with your MyChart username and password.  Due to Covid, a mask is required upon entering the hospital/clinic. If you do not have a mask, one will be given to you upon arrival. For doctor visits, patients may have 1 support person aged 18 or older with them. For treatment visits, patients cannot have anyone with them due to current Covid guidelines and our immunocompromised population.  

## 2022-02-12 NOTE — Progress Notes (Signed)
Treatment given today per MD orders. Tolerated infusion without adverse affects. Vital signs stable. No complaints at this time. Discharged from clinic ambulatory in stable condition. Alert and oriented x 3. F/U with [facility] as scheduled.   

## 2022-02-24 ENCOUNTER — Other Ambulatory Visit (HOSPITAL_COMMUNITY): Payer: Self-pay

## 2022-02-24 ENCOUNTER — Other Ambulatory Visit (HOSPITAL_COMMUNITY): Payer: Self-pay | Admitting: *Deleted

## 2022-02-24 ENCOUNTER — Other Ambulatory Visit (HOSPITAL_COMMUNITY): Payer: Self-pay | Admitting: Hematology

## 2022-02-24 DIAGNOSIS — C50912 Malignant neoplasm of unspecified site of left female breast: Secondary | ICD-10-CM

## 2022-02-24 MED ORDER — CAPECITABINE 500 MG PO TABS
1500.0000 mg | ORAL_TABLET | Freq: Two times a day (BID) | ORAL | 0 refills | Status: DC
  Filled 2022-02-24: qty 84, 21d supply, fill #0

## 2022-02-24 NOTE — Telephone Encounter (Signed)
Refill request for Xeloda approved.  Per last ovn, patient tolerating and is to continue therapy.

## 2022-03-03 ENCOUNTER — Other Ambulatory Visit (HOSPITAL_COMMUNITY): Payer: Self-pay

## 2022-03-05 ENCOUNTER — Inpatient Hospital Stay (HOSPITAL_BASED_OUTPATIENT_CLINIC_OR_DEPARTMENT_OTHER): Payer: Medicare Other | Admitting: Hematology

## 2022-03-05 ENCOUNTER — Inpatient Hospital Stay (HOSPITAL_COMMUNITY): Payer: Medicare Other

## 2022-03-05 VITALS — BP 131/71 | HR 57 | Temp 98.2°F | Resp 20 | Ht 65.0 in | Wt 181.0 lb

## 2022-03-05 DIAGNOSIS — C50912 Malignant neoplasm of unspecified site of left female breast: Secondary | ICD-10-CM

## 2022-03-05 DIAGNOSIS — E039 Hypothyroidism, unspecified: Secondary | ICD-10-CM | POA: Diagnosis not present

## 2022-03-05 DIAGNOSIS — G629 Polyneuropathy, unspecified: Secondary | ICD-10-CM | POA: Diagnosis not present

## 2022-03-05 DIAGNOSIS — Z79899 Other long term (current) drug therapy: Secondary | ICD-10-CM

## 2022-03-05 DIAGNOSIS — Z5112 Encounter for antineoplastic immunotherapy: Secondary | ICD-10-CM | POA: Diagnosis not present

## 2022-03-05 DIAGNOSIS — Z171 Estrogen receptor negative status [ER-]: Secondary | ICD-10-CM | POA: Diagnosis not present

## 2022-03-05 DIAGNOSIS — C773 Secondary and unspecified malignant neoplasm of axilla and upper limb lymph nodes: Secondary | ICD-10-CM | POA: Diagnosis not present

## 2022-03-05 DIAGNOSIS — Z95828 Presence of other vascular implants and grafts: Secondary | ICD-10-CM

## 2022-03-05 LAB — CBC WITH DIFFERENTIAL/PLATELET
Abs Immature Granulocytes: 0.01 10*3/uL (ref 0.00–0.07)
Basophils Absolute: 0 10*3/uL (ref 0.0–0.1)
Basophils Relative: 0 %
Eosinophils Absolute: 0.2 10*3/uL (ref 0.0–0.5)
Eosinophils Relative: 5 %
HCT: 33.7 % — ABNORMAL LOW (ref 36.0–46.0)
Hemoglobin: 11.5 g/dL — ABNORMAL LOW (ref 12.0–15.0)
Immature Granulocytes: 0 %
Lymphocytes Relative: 39 %
Lymphs Abs: 1.6 10*3/uL (ref 0.7–4.0)
MCH: 35.4 pg — ABNORMAL HIGH (ref 26.0–34.0)
MCHC: 34.1 g/dL (ref 30.0–36.0)
MCV: 103.7 fL — ABNORMAL HIGH (ref 80.0–100.0)
Monocytes Absolute: 0.5 10*3/uL (ref 0.1–1.0)
Monocytes Relative: 12 %
Neutro Abs: 1.8 10*3/uL (ref 1.7–7.7)
Neutrophils Relative %: 44 %
Platelets: 201 10*3/uL (ref 150–400)
RBC: 3.25 MIL/uL — ABNORMAL LOW (ref 3.87–5.11)
RDW: 15.9 % — ABNORMAL HIGH (ref 11.5–15.5)
WBC: 4.1 10*3/uL (ref 4.0–10.5)
nRBC: 0 % (ref 0.0–0.2)

## 2022-03-05 LAB — COMPREHENSIVE METABOLIC PANEL
ALT: 14 U/L (ref 0–44)
AST: 19 U/L (ref 15–41)
Albumin: 3.8 g/dL (ref 3.5–5.0)
Alkaline Phosphatase: 73 U/L (ref 38–126)
Anion gap: 5 (ref 5–15)
BUN: 19 mg/dL (ref 8–23)
CO2: 28 mmol/L (ref 22–32)
Calcium: 9.2 mg/dL (ref 8.9–10.3)
Chloride: 104 mmol/L (ref 98–111)
Creatinine, Ser: 0.74 mg/dL (ref 0.44–1.00)
GFR, Estimated: >60 mL/min (ref >60)
Glucose, Bld: 96 mg/dL (ref 70–99)
Potassium: 3.8 mmol/L (ref 3.5–5.1)
Sodium: 137 mmol/L (ref 135–145)
Total Bilirubin: 0.4 mg/dL (ref 0.3–1.2)
Total Protein: 7.4 g/dL (ref 6.5–8.1)

## 2022-03-05 LAB — TSH: TSH: 3.266 u[IU]/mL (ref 0.350–4.500)

## 2022-03-05 LAB — MAGNESIUM: Magnesium: 1.8 mg/dL (ref 1.7–2.4)

## 2022-03-05 MED ORDER — SODIUM CHLORIDE 0.9 % IV SOLN
200.0000 mg | Freq: Once | INTRAVENOUS | Status: AC
  Administered 2022-03-05: 200 mg via INTRAVENOUS
  Filled 2022-03-05: qty 8

## 2022-03-05 MED ORDER — SODIUM CHLORIDE 0.9 % IV SOLN: Freq: Once | INTRAVENOUS | Status: AC

## 2022-03-05 MED ORDER — SODIUM CHLORIDE 0.9% FLUSH
10.0000 mL | INTRAVENOUS | Status: DC | PRN
  Administered 2022-03-05: 10 mL

## 2022-03-05 MED ORDER — HEPARIN SOD (PORK) LOCK FLUSH 100 UNIT/ML IV SOLN
500.0000 [IU] | Freq: Once | INTRAVENOUS | Status: AC | PRN
  Administered 2022-03-05: 500 [IU]

## 2022-03-05 NOTE — Patient Instructions (Signed)
[facility]  Discharge Instructions: Thank you for choosing [facility] to provide your oncology and hematology care.  If you have a lab appointment with the Cancer Center, please come in thru the Main Entrance and check in at the main information desk.  Wear comfortable clothing and clothing appropriate for easy access to any Portacath or PICC line.   We strive to give you quality time with your provider. You may need to reschedule your appointment if you arrive late (15 or more minutes).  Arriving late affects you and other patients whose appointments are after yours.  Also, if you miss three or more appointments without notifying the office, you may be dismissed from the clinic at the provider's discretion.      For prescription refill requests, have your pharmacy contact our office and allow 72 hours for refills to be completed.    Today you received the following chemotherapy and/or immunotherapy agents Keytruda   To help prevent nausea and vomiting after your treatment, we encourage you to take your nausea medication as directed.  BELOW ARE SYMPTOMS THAT SHOULD BE REPORTED IMMEDIATELY: *FEVER GREATER THAN 100.4 F (38 C) OR HIGHER *CHILLS OR SWEATING *NAUSEA AND VOMITING THAT IS NOT CONTROLLED WITH YOUR NAUSEA MEDICATION *UNUSUAL SHORTNESS OF BREATH *UNUSUAL BRUISING OR BLEEDING *URINARY PROBLEMS (pain or burning when urinating, or frequent urination) *BOWEL PROBLEMS (unusual diarrhea, constipation, pain near the anus) TENDERNESS IN MOUTH AND THROAT WITH OR WITHOUT PRESENCE OF ULCERS (sore throat, sores in mouth, or a toothache) UNUSUAL RASH, SWELLING OR PAIN  UNUSUAL VAGINAL DISCHARGE OR ITCHING   Items with * indicate a potential emergency and should be followed up as soon as possible or go to the Emergency Department if any problems should occur.  Please show the CHEMOTHERAPY ALERT CARD or IMMUNOTHERAPY ALERT CARD at check-in to the Emergency Department  and triage nurse.  Should you have questions after your visit or need to cancel or reschedule your appointment, please contact [facility] [phone]  and follow the prompts.  Office hours are 8:00 a.m. to 4:30 p.m. Monday - Friday. Please note that voicemails left after 4:00 p.m. may not be returned until the following business day.  We are closed weekends and major holidays. You have access to a nurse at all times for urgent questions. Please call the main number to the clinic [phone] and follow the prompts.  For any non-urgent questions, you may also contact your provider using MyChart. We now offer e-Visits for anyone 18 and older to request care online for non-urgent symptoms. For details visit [email or web address].   Also download the MyChart app! Go to the app store, search "MyChart", open the app, select [facility], and log in with your MyChart username and password.  Masks are optional in the cancer centers. If you would like for your care team to wear a mask while they are taking care of you, please let them know. For doctor visits, patients may have with them one support person who is at least 73 years old. At this time, visitors are not allowed in the infusion area. Pembrolizumab injection What is this medication? PEMBROLIZUMAB (pem broe liz ue mab) is a monoclonal antibody. It is used to treat certain types of cancer. This medicine may be used for other purposes; ask your health care provider or pharmacist if you have questions. COMMON BRAND NAME(S): Keytruda What should I tell my care team before I take this medication? They need to   know if you have any of these conditions: autoimmune diseases like Crohn's disease, ulcerative colitis, or lupus have had or planning to have an allogeneic stem cell transplant (uses someone else's stem cells) history of organ transplant history of chest radiation nervous system problems like myasthenia gravis or Guillain-Barre  syndrome an unusual or allergic reaction to pembrolizumab, other medicines, foods, dyes, or preservatives pregnant or trying to get pregnant breast-feeding How should I use this medication? This medicine is for infusion into a vein. It is given by a health care professional in a hospital or clinic setting. A special MedGuide will be given to you before each treatment. Be sure to read this information carefully each time. Talk to your pediatrician regarding the use of this medicine in children. While this drug may be prescribed for children as young as 6 months for selected conditions, precautions do apply. Overdosage: If you think you have taken too much of this medicine contact a poison control center or emergency room at once. NOTE: This medicine is only for you. Do not share this medicine with others. What if I miss a dose? It is important not to miss your dose. Call your doctor or health care professional if you are unable to keep an appointment. What may interact with this medication? Interactions have not been studied. This list may not describe all possible interactions. Give your health care provider a list of all the medicines, herbs, non-prescription drugs, or dietary supplements you use. Also tell them if you smoke, drink alcohol, or use illegal drugs. Some items may interact with your medicine. What should I watch for while using this medication? Your condition will be monitored carefully while you are receiving this medicine. You may need blood work done while you are taking this medicine. Do not become pregnant while taking this medicine or for 4 months after stopping it. Women should inform their doctor if they wish to become pregnant or think they might be pregnant. There is a potential for serious side effects to an unborn child. Talk to your health care professional or pharmacist for more information. Do not breast-feed an infant while taking this medicine or for 4 months after  the last dose. What side effects may I notice from receiving this medication? Side effects that you should report to your doctor or health care professional as soon as possible: allergic reactions like skin rash, itching or hives, swelling of the face, lips, or tongue bloody or black, tarry breathing problems changes in vision chest pain chills confusion constipation cough diarrhea dizziness or feeling faint or lightheaded fast or irregular heartbeat fever flushing joint pain low blood counts - this medicine may decrease the number of white blood cells, red blood cells and platelets. You may be at increased risk for infections and bleeding. muscle pain muscle weakness pain, tingling, numbness in the hands or feet persistent headache redness, blistering, peeling or loosening of the skin, including inside the mouth signs and symptoms of high blood sugar such as dizziness; dry mouth; dry skin; fruity breath; nausea; stomach pain; increased hunger or thirst; increased urination signs and symptoms of kidney injury like trouble passing urine or change in the amount of urine signs and symptoms of liver injury like dark urine, light-colored stools, loss of appetite, nausea, right upper belly pain, yellowing of the eyes or skin sweating swollen lymph nodes weight loss Side effects that usually do not require medical attention (report to your doctor or health care professional if they continue or are   bothersome): decreased appetite hair loss tiredness This list may not describe all possible side effects. Call your doctor for medical advice about side effects. You may report side effects to FDA at 1-800-FDA-1088. Where should I keep my medication? This drug is given in a hospital or clinic and will not be stored at home. NOTE: This sheet is a summary. It may not cover all possible information. If you have questions about this medicine, talk to your doctor, pharmacist, or health care  provider.  2023 Elsevier/Gold Standard (2021-08-01 00:00:00)  

## 2022-03-05 NOTE — Progress Notes (Signed)
Pt presents today for Keytruda per provider's order. Labs and vital signs WNL for treatment. Okay to proceed with treatment today per Dr.K.  Keytruda given today per MD orders. Tolerated infusion without adverse affects. Vital signs stable. No complaints at this time. Discharged from clinic ambulatory in stable condition. Alert and oriented x 3. F/U with [facility] as scheduled.   

## 2022-03-05 NOTE — Patient Instructions (Addendum)
[place] [facility] Discharge Instructions   You were seen and examined today by Dr. Delton Coombes.  He reviewed the results of your lab work which is normal/stable.   We will proceed with your treatment today.  Continue Xeloda as prescribed.   You may use hydrocortisone cream from the itching on your arms.   Return as scheduled.    Thank you for choosing [place] [facility] to provide your oncology and hematology care.  To afford each patient quality time with our provider, please arrive at least 15 minutes before your scheduled appointment time.   If you have a lab appointment with the [facility] please come in thru the Main Entrance and check in at the main information desk.  You need to re-schedule your appointment should you arrive 10 or more minutes late.  We strive to give you quality time with our providers, and arriving late affects you and other patients whose appointments are after yours.  Also, if you no show three or more times for appointments you may be dismissed from the clinic at the providers discretion.     Again, thank you for choosing [facility].  Our hope is that these requests will decrease the amount of time that you wait before being seen by our physicians.       _____________________________________________________________  Should you have questions after your visit to [facility], please contact our office at [phone] and follow the prompts.  Our office hours are 8:00 a.m. and 4:30 p.m. Monday - Friday.  Please note that voicemails left after 4:00 p.m. may not be returned until the following business day.  We are closed weekends and major holidays.  You do have access to a nurse 24-7, just call the main number to the clinic [phone] and do not press any options, hold on the line and a nurse will answer the phone.    For prescription refill requests, have your pharmacy  contact our office and allow 72 hours.    Due to Covid, you will need to wear a mask upon entering the hospital. If you do not have a mask, a mask will be given to you at the Main Entrance upon arrival. For doctor visits, patients may have 1 support person age 68 or older with them. For treatment visits, patients can not have anyone with them due to social distancing guidelines and our immunocompromised population.

## 2022-03-07 ENCOUNTER — Other Ambulatory Visit (HOSPITAL_COMMUNITY): Payer: Self-pay

## 2022-03-13 DIAGNOSIS — I700 Atherosclerosis of aorta: Secondary | ICD-10-CM | POA: Diagnosis not present

## 2022-03-13 DIAGNOSIS — E038 Other specified hypothyroidism: Secondary | ICD-10-CM | POA: Diagnosis not present

## 2022-03-13 DIAGNOSIS — E785 Hyperlipidemia, unspecified: Secondary | ICD-10-CM | POA: Diagnosis not present

## 2022-03-13 DIAGNOSIS — I10 Essential (primary) hypertension: Secondary | ICD-10-CM | POA: Diagnosis not present

## 2022-03-16 ENCOUNTER — Other Ambulatory Visit (HOSPITAL_COMMUNITY): Payer: Self-pay

## 2022-03-16 ENCOUNTER — Other Ambulatory Visit (HOSPITAL_COMMUNITY): Payer: Self-pay | Admitting: Hematology

## 2022-03-16 DIAGNOSIS — C50912 Malignant neoplasm of unspecified site of left female breast: Secondary | ICD-10-CM

## 2022-03-18 ENCOUNTER — Other Ambulatory Visit (HOSPITAL_COMMUNITY): Payer: Self-pay

## 2022-03-18 ENCOUNTER — Other Ambulatory Visit (HOSPITAL_COMMUNITY): Payer: Self-pay | Admitting: *Deleted

## 2022-03-18 ENCOUNTER — Encounter (HOSPITAL_COMMUNITY): Payer: Self-pay | Admitting: Hematology

## 2022-03-18 MED ORDER — CAPECITABINE 500 MG PO TABS
1500.0000 mg | ORAL_TABLET | Freq: Two times a day (BID) | ORAL | 0 refills | Status: DC
  Filled 2022-03-18: qty 84, 21d supply, fill #0

## 2022-03-18 NOTE — Telephone Encounter (Signed)
Xeloda refill accepted.  Per last ovn, patient is tolerating and is to continue therapy.

## 2022-03-24 ENCOUNTER — Other Ambulatory Visit (HOSPITAL_COMMUNITY): Payer: Self-pay

## 2022-03-26 ENCOUNTER — Encounter (HOSPITAL_COMMUNITY): Payer: Self-pay | Admitting: *Deleted

## 2022-03-26 ENCOUNTER — Inpatient Hospital Stay (HOSPITAL_COMMUNITY): Payer: Medicare Other | Attending: Hematology

## 2022-03-26 ENCOUNTER — Inpatient Hospital Stay (HOSPITAL_BASED_OUTPATIENT_CLINIC_OR_DEPARTMENT_OTHER): Payer: Medicare Other | Admitting: Hematology

## 2022-03-26 ENCOUNTER — Inpatient Hospital Stay (HOSPITAL_COMMUNITY): Payer: Medicare Other

## 2022-03-26 VITALS — BP 128/73 | HR 65 | Temp 98.9°F | Resp 20

## 2022-03-26 DIAGNOSIS — C50912 Malignant neoplasm of unspecified site of left female breast: Secondary | ICD-10-CM | POA: Diagnosis not present

## 2022-03-26 DIAGNOSIS — Z171 Estrogen receptor negative status [ER-]: Secondary | ICD-10-CM | POA: Diagnosis not present

## 2022-03-26 DIAGNOSIS — Z9012 Acquired absence of left breast and nipple: Secondary | ICD-10-CM | POA: Diagnosis not present

## 2022-03-26 DIAGNOSIS — E039 Hypothyroidism, unspecified: Secondary | ICD-10-CM | POA: Insufficient documentation

## 2022-03-26 DIAGNOSIS — C778 Secondary and unspecified malignant neoplasm of lymph nodes of multiple regions: Secondary | ICD-10-CM | POA: Insufficient documentation

## 2022-03-26 DIAGNOSIS — Z90710 Acquired absence of both cervix and uterus: Secondary | ICD-10-CM | POA: Diagnosis not present

## 2022-03-26 DIAGNOSIS — I10 Essential (primary) hypertension: Secondary | ICD-10-CM | POA: Insufficient documentation

## 2022-03-26 DIAGNOSIS — Z95828 Presence of other vascular implants and grafts: Secondary | ICD-10-CM

## 2022-03-26 DIAGNOSIS — Z79899 Other long term (current) drug therapy: Secondary | ICD-10-CM | POA: Diagnosis not present

## 2022-03-26 DIAGNOSIS — Z801 Family history of malignant neoplasm of trachea, bronchus and lung: Secondary | ICD-10-CM | POA: Diagnosis not present

## 2022-03-26 DIAGNOSIS — Z803 Family history of malignant neoplasm of breast: Secondary | ICD-10-CM | POA: Diagnosis not present

## 2022-03-26 DIAGNOSIS — Z5112 Encounter for antineoplastic immunotherapy: Secondary | ICD-10-CM | POA: Insufficient documentation

## 2022-03-26 LAB — COMPREHENSIVE METABOLIC PANEL
ALT: 15 U/L (ref 0–44)
AST: 23 U/L (ref 15–41)
Albumin: 3.8 g/dL (ref 3.5–5.0)
Alkaline Phosphatase: 75 U/L (ref 38–126)
Anion gap: 5 (ref 5–15)
BUN: 20 mg/dL (ref 8–23)
CO2: 25 mmol/L (ref 22–32)
Calcium: 8.7 mg/dL — ABNORMAL LOW (ref 8.9–10.3)
Chloride: 105 mmol/L (ref 98–111)
Creatinine, Ser: 0.79 mg/dL (ref 0.44–1.00)
GFR, Estimated: >60 mL/min (ref >60)
Glucose, Bld: 98 mg/dL (ref 70–99)
Potassium: 3.7 mmol/L (ref 3.5–5.1)
Sodium: 135 mmol/L (ref 135–145)
Total Bilirubin: 0.7 mg/dL (ref 0.3–1.2)
Total Protein: 6.9 g/dL (ref 6.5–8.1)

## 2022-03-26 LAB — CBC WITH DIFFERENTIAL/PLATELET
Abs Immature Granulocytes: 0.01 10*3/uL (ref 0.00–0.07)
Basophils Absolute: 0 10*3/uL (ref 0.0–0.1)
Basophils Relative: 0 %
Eosinophils Absolute: 0.2 10*3/uL (ref 0.0–0.5)
Eosinophils Relative: 6 %
HCT: 32.3 % — ABNORMAL LOW (ref 36.0–46.0)
Hemoglobin: 11.1 g/dL — ABNORMAL LOW (ref 12.0–15.0)
Immature Granulocytes: 0 %
Lymphocytes Relative: 44 %
Lymphs Abs: 1.5 10*3/uL (ref 0.7–4.0)
MCH: 36.5 pg — ABNORMAL HIGH (ref 26.0–34.0)
MCHC: 34.4 g/dL (ref 30.0–36.0)
MCV: 106.3 fL — ABNORMAL HIGH (ref 80.0–100.0)
Monocytes Absolute: 0.4 10*3/uL (ref 0.1–1.0)
Monocytes Relative: 12 %
Neutro Abs: 1.3 10*3/uL — ABNORMAL LOW (ref 1.7–7.7)
Neutrophils Relative %: 38 %
Platelets: 166 10*3/uL (ref 150–400)
RBC: 3.04 MIL/uL — ABNORMAL LOW (ref 3.87–5.11)
RDW: 16.2 % — ABNORMAL HIGH (ref 11.5–15.5)
WBC: 3.4 10*3/uL — ABNORMAL LOW (ref 4.0–10.5)
nRBC: 0 % (ref 0.0–0.2)

## 2022-03-26 LAB — TSH: TSH: 4.2 u[IU]/mL (ref 0.350–4.500)

## 2022-03-26 LAB — MAGNESIUM: Magnesium: 1.7 mg/dL (ref 1.7–2.4)

## 2022-03-26 MED ORDER — CLOBETASOL PROPIONATE 0.05 % EX CREA: 1.0000 | TOPICAL_CREAM | Freq: Two times a day (BID) | CUTANEOUS | 3 refills | Status: DC

## 2022-03-26 MED ORDER — HEPARIN SOD (PORK) LOCK FLUSH 100 UNIT/ML IV SOLN
500.0000 [IU] | Freq: Once | INTRAVENOUS | Status: AC | PRN
  Administered 2022-03-26: 500 [IU]

## 2022-03-26 MED ORDER — SODIUM CHLORIDE 0.9 % IV SOLN
200.0000 mg | Freq: Once | INTRAVENOUS | Status: AC
  Administered 2022-03-26: 200 mg via INTRAVENOUS
  Filled 2022-03-26: qty 8

## 2022-03-26 MED ORDER — SODIUM CHLORIDE 0.9 % IV SOLN: Freq: Once | INTRAVENOUS | Status: AC

## 2022-03-26 MED ORDER — LIDOCAINE 5 % EX OINT: 1.0000 | TOPICAL_OINTMENT | Freq: Two times a day (BID) | CUTANEOUS | 2 refills | Status: DC

## 2022-03-26 MED ORDER — SODIUM CHLORIDE 0.9% FLUSH
10.0000 mL | INTRAVENOUS | Status: DC | PRN
  Administered 2022-03-26: 10 mL

## 2022-03-26 NOTE — Progress Notes (Signed)
Patient presents today for Keytruda infusion per providers order.  Vital signs within parameters for treatment.  Labs pending.  Patient has no new complaints at this time.  Keytruda given today per MD orders.  Stable during infusion without adverse affects.  Vital signs stable.  No complaints at this time.  Discharge from clinic ambulatory in stable condition.  Alert and oriented X 3.  Follow up with [facility] as scheduled.

## 2022-03-26 NOTE — Progress Notes (Signed)
Patients port flushed without difficulty.  Good blood return noted with no bruising or swelling noted at site.  Stable during access and blood draw.  Patient to remain accessed for treatment. 

## 2022-03-26 NOTE — Progress Notes (Signed)
Received PA approval for lidocaine 5% ointment from [facility].  Approval dates are for 03/26/22-06/26/22.  Authorization number is [id number].  Document scanned to Epic.

## 2022-03-26 NOTE — Progress Notes (Signed)
Patient is taking Xeloda as prescribed.  She has not missed any doses and reports no side effects at this time other than feet and hands peeling and burning.

## 2022-03-26 NOTE — Patient Instructions (Signed)
[facility]  Discharge Instructions: Thank you for choosing [facility] to provide your oncology and hematology care.  If you have a lab appointment with the Cancer Center, please come in thru the Main Entrance and check in at the main information desk.  Wear comfortable clothing and clothing appropriate for easy access to any Portacath or PICC line.   We strive to give you quality time with your provider. You may need to reschedule your appointment if you arrive late (15 or more minutes).  Arriving late affects you and other patients whose appointments are after yours.  Also, if you miss three or more appointments without notifying the office, you may be dismissed from the clinic at the provider's discretion.      For prescription refill requests, have your pharmacy contact our office and allow 72 hours for refills to be completed.    Today you received the following chemotherapy and/or immunotherapy agents Keytruda      To help prevent nausea and vomiting after your treatment, we encourage you to take your nausea medication as directed.  BELOW ARE SYMPTOMS THAT SHOULD BE REPORTED IMMEDIATELY: *FEVER GREATER THAN 100.4 F (38 C) OR HIGHER *CHILLS OR SWEATING *NAUSEA AND VOMITING THAT IS NOT CONTROLLED WITH YOUR NAUSEA MEDICATION *UNUSUAL SHORTNESS OF BREATH *UNUSUAL BRUISING OR BLEEDING *URINARY PROBLEMS (pain or burning when urinating, or frequent urination) *BOWEL PROBLEMS (unusual diarrhea, constipation, pain near the anus) TENDERNESS IN MOUTH AND THROAT WITH OR WITHOUT PRESENCE OF ULCERS (sore throat, sores in mouth, or a toothache) UNUSUAL RASH, SWELLING OR PAIN  UNUSUAL VAGINAL DISCHARGE OR ITCHING   Items with * indicate a potential emergency and should be followed up as soon as possible or go to the Emergency Department if any problems should occur.  Please show the CHEMOTHERAPY ALERT CARD or IMMUNOTHERAPY ALERT CARD at check-in to the Emergency  Department and triage nurse.  Should you have questions after your visit or need to cancel or reschedule your appointment, please contact [facility] [phone]  and follow the prompts.  Office hours are 8:00 a.m. to 4:30 p.m. Monday - Friday. Please note that voicemails left after 4:00 p.m. may not be returned until the following business day.  We are closed weekends and major holidays. You have access to a nurse at all times for urgent questions. Please call the main number to the clinic [phone] and follow the prompts.  For any non-urgent questions, you may also contact your provider using MyChart. We now offer e-Visits for anyone 18 and older to request care online for non-urgent symptoms. For details visit [email or web address].   Also download the MyChart app! Go to the app store, search "MyChart", open the app, select [facility], and log in with your MyChart username and password.  Masks are optional in the cancer centers. If you would like for your care team to wear a mask while they are taking care of you, please let them know. For doctor visits, patients may have with them one support person who is at least 73 years old. At this time, visitors are not allowed in the infusion area.  

## 2022-03-26 NOTE — Progress Notes (Signed)
Denise [name], Denise [name] [id number]   CLINIC:  Medical Oncology/Hematology  PCP:  Denise Burly, MD [place] / [place] [address] [phone]   REASON FOR VISIT:  Follow-up for T3N3c (stage IIIc) triple negative invasive lobular carcinoma of the left breast  PRIOR THERAPY: Left mastectomy and lymph node excision by Dr. Ninfa Linden on 11/20/2021  NGS Results: not done  CURRENT THERAPY: Pembrolizumab + Carboplatin D1,8,15+ Paclitaxel D1,8,15 q21d X 4 cycles / Pembrolizumab + AC q21d x 4 cycles  BRIEF ONCOLOGIC HISTORY:  Oncology History  Invasive lobular carcinoma of left breast in female [facility])  01/23/2021 Initial Diagnosis   Invasive lobular carcinoma of left breast in female [facility])   02/19/2021 Genetic Testing   Negative genetic testing on the CancerNext-Expanded+RNAinsight panel.  SMARCB1 VUS identified.  The CancerNext-Expanded gene panel offered by [facility] and includes sequencing and rearrangement analysis for the following 77 genes: AIP, ALK, APC*, ATM*, AXIN2, BAP1, BARD1, BLM, BMPR1A, BRCA1*, BRCA2*, BRIP1*, CDC73, CDH1*, CDK4, CDKN1B, CDKN2A, CHEK2*, CTNNA1, DICER1, FANCC, FH, FLCN, GALNT12, KIF1B, LZTR1, MAX, MEN1, MET, MLH1*, MSH2*, MSH3, MSH6*, MUTYH*, NBN, NF1*, NF2, NTHL1, PALB2*, PHOX2B, PMS2*, POT1, PRKAR1A, PTCH1, PTEN*, RAD51C*, RAD51D*, RB1, RECQL, RET, SDHA, SDHAF2, SDHB, SDHC, SDHD, SMAD4, SMARCA4, SMARCB1, SMARCE1, STK11, SUFU, TMEM127, TP53*, TSC1, TSC2, VHL and XRCC2 (sequencing and deletion/duplication); EGFR, EGLN1, HOXB13, KIT, MITF, PDGFRA, POLD1, and POLE (sequencing only); EPCAM and GREM1 (deletion/duplication only). DNA and RNA analyses performed for * genes. The report date is February 19, 2021.   02/27/2021 -  Chemotherapy   Patient is on Treatment Plan : BREAST Pembrolizumab + Carboplatin D1,8,15+ Paclitaxel D1,8,15 q21d X 4 cycles / Pembrolizumab + AC q21d x 4 cycles     12/22/2021 Cancer Staging   Staging form:  Breast, AJCC 8th Edition - Pathologic stage from 12/22/2021: No Stage Recommended (ypT3, pN3a, cM0, G2, ER-, PR-, HER2-) - Signed by Denise Jack, MD on 12/22/2021 Histopathologic type: Lobular carcinoma, NOS Stage prefix: Post-therapy Method of lymph node assessment: Axillary lymph node dissection Multigene prognostic tests performed: None Histologic grading system: 3 grade system     CANCER STAGING:  Cancer Staging  Invasive lobular carcinoma of left breast in female [facility]) Staging form: Breast, AJCC 8th Edition - Clinical stage from 01/23/2021: cT3, cN3c, G2, ER-, PR-, HER2- - Unsigned - Pathologic stage from 12/22/2021: No Stage Recommended (ypT3, pN3a, cM0, G2, ER-, PR-, HER2-) - Signed by Denise Jack, MD on 12/22/2021   INTERVAL HISTORY:  Denise [name], a 71 y.o. female, returns for routine follow-up and consideration for next cycle of chemotherapy. Denise [name] was last seen on 03/05/2022.  Due for cycle #13 of Keytruda today.   Overall, she tells me she has been feeling pretty well. She reports dry skin on her hands, but she denies peeling skin or pain in her hands. She reports burning sensation, pins-and-needles, numbness in her toes, purplish skin, blistering, and peeling skin on her feet during the second week on Xeloda. The pain in her feet caused difficulty with walking. This is her week off of Xeloda, and she reports her feet have improved. She denies mouth sores, increased diarrhea, skin rash, dry cough, and SOB.   Overall, she feels ready for next cycle of chemo today.    REVIEW OF SYSTEMS:  Review of Systems  Constitutional:  Negative for appetite change and fatigue.  HENT:   Negative for mouth sores.   Respiratory:  Negative for cough  and shortness of breath.   Gastrointestinal:  Positive for diarrhea (stable), nausea and vomiting.  Skin:  Negative for rash.       Peeling - feet  Neurological:  Positive for dizziness, headaches and numbness (toes).   All other systems reviewed and are negative.   PAST MEDICAL/SURGICAL HISTORY:  Past Medical History:  Diagnosis Date   Asthma    as child   Cancer ([place]) 12/2020   left breast IMC   Complication of anesthesia    pateitn states,' I coded when I had my D&Cmany years ago.   Dyspnea    Family history of breast cancer    Hypertension    Hypothyroidism    PONV (postoperative nausea and vomiting)    Port-A-Cath in place 02/26/2021   Past Surgical History:  Procedure Laterality Date   ABDOMINAL HYSTERECTOMY     BIOPSY  01/17/2018   Procedure: BIOPSY;  Surgeon: Denise Houston, MD;  [facility];  Service: Endoscopy;;  duodenum,gastric   CHOLECYSTECTOMY     COLONOSCOPY WITH PROPOFOL N/A 01/17/2018   Procedure: COLONOSCOPY WITH PROPOFOL;  Surgeon: Denise Houston, MD;  [facility];  Service: Endoscopy;  Laterality: N/A;  7:30   DILATION AND CURETTAGE OF UTERUS     ESOPHAGOGASTRODUODENOSCOPY (EGD) WITH PROPOFOL N/A 01/17/2018   Procedure: ESOPHAGOGASTRODUODENOSCOPY (EGD) WITH PROPOFOL;  Surgeon: Denise Houston, MD;  [facility];  Service: Endoscopy;  Laterality: N/A;   POLYPECTOMY  01/17/2018   Procedure: POLYPECTOMY;  Surgeon: Denise Houston, MD;  [facility];  Service: Endoscopy;;  transverse colon, cecal   PORTACATH PLACEMENT Right 02/17/2021   Procedure: INSERTION PORT-A-CATH;  Surgeon: Denise Keens, MD;  Location: [place];  Service: General;  Laterality: Right;   RADIOACTIVE SEED GUIDED AXILLARY SENTINEL LYMPH NODE Left 11/20/2021   Procedure: RADIOACTIVE SEED GUIDED LEFT AXILLARY SENTINEL LYMPH NODE DISSECTION;  Surgeon: Denise Keens, MD;  Location: [place];  Service: General;  Laterality: Left;   TOTAL MASTECTOMY Left 11/20/2021   Procedure: LEFT TOTAL MASTECTOMY;  Surgeon: Denise Keens, MD;  Location: [place];  Service: General;  Laterality: Left;    SOCIAL HISTORY:   Social History   Socioeconomic History   Marital status: Widowed    Spouse name: Not on file   Number of children: 3   Years of education: Not on file   Highest education level: Not on file  Occupational History   Occupation: [employer]   Occupation: retired  Tobacco Use   Smoking status: Never   Smokeless tobacco: Never  Vaping Use   Vaping Use: Never used  Substance and Sexual Activity   Alcohol use: Never   Drug use: Never   Sexual activity: Not Currently    Birth control/protection: Surgical  Other Topics Concern   Not on file  Social History Narrative   Not on file   Social Determinants of Health   Financial Resource Strain: [place]  (01/22/2021)   Overall Financial Resource Strain (CARDIA)    Difficulty of Paying Living Expenses: Not hard at all  Food Insecurity: No [place] (01/22/2021)   Hunger Vital Sign    Worried About Running Out of Food in the Last Year: Never true    [place] in the Last Year: Never true  Transportation Needs: No Transportation Needs (01/22/2021)   PRAPARE - Hydrologist (Medical): No  Lack of Transportation (Non-Medical): No  Physical Activity: Insufficiently Active (01/22/2021)   Exercise Vital Sign    Days of Exercise per Week: 3 days    Minutes of Exercise per Session: 30 min  Stress: No Stress Concern Present (01/22/2021)   [place]    Feeling of Stress : Not at all  Social Connections: Moderately Integrated (01/22/2021)   Social Connection and Isolation Panel [NHANES]    Frequency of Communication with Friends and Family: More than three times a week    Frequency of Social Gatherings with Friends and Family: More than three times a week    Attends Religious Services: More than 4 times per year    Active Member of [employer] or Organizations: No    Attends Music therapist: More than 4 times per  year    Marital Status: Widowed  Intimate Partner Violence: Not At Risk (01/22/2021)   Humiliation, Afraid, Rape, and Kick questionnaire    Fear of Current or Ex-Partner: No    Emotionally Abused: No    Physically Abused: No    Sexually Abused: No    FAMILY HISTORY:  Family History  Problem Relation Age of Onset   Breast cancer Mother        dx in her 37s   Heart disease Mother    Thyroid disease Mother    Lung cancer Father        dx in his 77s   Thyroid disease Maternal Aunt    Thyroid nodules Maternal Grandmother 35       goiter   Thyroid disease Maternal Grandmother    Heart disease Maternal Grandfather    Heart disease Paternal Grandmother    Heart disease Paternal Grandfather    Thyroid disease Daughter    Thyroid disease Daughter    Cancer Maternal Uncle        NOS   Cancer Paternal Uncle        NOS   Breast cancer Cousin        pat first cousin died in her 43s;     CURRENT MEDICATIONS:  Current Outpatient Medications  Medication Sig Dispense Refill   Acetaminophen (TYLENOL PO) Take 1 tablet by mouth as needed.     albuterol (VENTOLIN HFA) 108 (90 Base) MCG/ACT inhaler Inhale into the lungs.     benazepril (LOTENSIN) 20 MG tablet Take by mouth.     capecitabine (XELODA) 500 MG tablet Take 3 tablets (1,500 mg total) by mouth 2 (two) times daily after a meal. Take for 14 days, then hold for 7 days. Repeat every 21 days. 84 tablet 0   filgrastim (NEUPOGEN) 480 MCG/0.8ML SOSY injection Inject 0.8 mLs (480 mcg total) into the skin daily as needed for up to 8 doses (self-administer subcutaneously as directed by Dr. Delton Coombes as needed for neutropenia). 6.4 mL 0   gabapentin (NEURONTIN) 300 MG capsule Take 1 capsule by mouth at bedtime.     hydrochlorothiazide (MICROZIDE) 12.5 MG capsule Take 12.5 mg by mouth daily.     levothyroxine (SYNTHROID) 75 MCG tablet Take 100 mcg by mouth daily before breakfast.     lidocaine-prilocaine (EMLA) cream Apply a small amount to port  a cath site and cover with plastic wrap 1 hour prior to infusion appointments 30 g 3   LORazepam (ATIVAN) 0.5 MG tablet Take 0.5 mg by mouth daily as needed.     losartan (COZAAR) 50 MG tablet Take 50 mg by  mouth daily.     magnesium oxide (MAG-OX) 400 MG tablet Take 1 tablet (400 mg total) by mouth 2 (two) times daily. 60 tablet 6   Melatonin 5 MG TABS Take 10 mg by mouth. As needed for sleep     nitrofurantoin, macrocrystal-monohydrate, (MACROBID) 100 MG capsule Take 100 mg by mouth 2 (two) times daily.     Omega-3 Fatty Acids (FISH OIL PO) Take by mouth.     Pembrolizumab (KEYTRUDA IV) Inject into the vein every 21 ( twenty-one) days.     prochlorperazine (COMPAZINE) 10 MG tablet Take by mouth.     Tiotropium Bromide-Olodaterol (STIOLTO RESPIMAT) 2.5-2.5 MCG/ACT AERS Inhale 1-2 puffs into the lungs as directed.     No current facility-administered medications for this visit.    ALLERGIES:  Allergies  Allergen Reactions   Exforge [Amlodipine Besylate-Valsartan] Nausea And Vomiting   Tape Other (See Comments)    Redness and Irriatation   Cheese     Hard cheese   Levofloxacin In D5w Hives   Strawberry Extract Diarrhea    Seeds,nuts, lettuce, grapes   Tramadol Hcl Nausea And Vomiting   Yeast-Related Products Hives    Mold on bread    PHYSICAL EXAM:  Performance status (ECOG): 1 - Symptomatic but completely ambulatory  There were no vitals filed for this visit. Wt Readings from Last 3 Encounters:  03/05/22 181 lb (82.1 kg)  01/06/22 182 lb 4.8 oz (82.7 kg)  01/01/22 183 lb 3.2 oz (83.1 kg)   Physical Exam Vitals reviewed.  Constitutional:      Appearance: Normal appearance.  Cardiovascular:     Rate and Rhythm: Normal rate and regular rhythm.     Pulses: Normal pulses.     Heart sounds: Normal heart sounds.  Pulmonary:     Effort: Pulmonary effort is normal.     Breath sounds: Normal breath sounds.  Skin:    Comments: Skin peeling on L great toe  Neurological:      General: No focal deficit present.     Mental Status: She is alert and oriented to person, place, and time.  Psychiatric:        Mood and Affect: Mood normal.        Behavior: Behavior normal.     LABORATORY DATA:  I have reviewed the labs as listed.     Latest Ref Rng & Units 03/05/2022   11:35 AM 02/12/2022   11:48 AM 01/22/2022   12:14 PM  CBC  WBC 4.0 - 10.5 K/uL 4.1  4.0  4.3   Hemoglobin 12.0 - 15.0 g/dL 11.5  11.2  11.4   Hematocrit 36.0 - 46.0 % 33.7  33.3  33.6   Platelets 150 - 400 K/uL 201  196  192       Latest Ref Rng & Units 03/05/2022   11:35 AM 02/12/2022   11:48 AM 01/22/2022   12:14 PM  CMP  Glucose 70 - 99 mg/dL 96  98  108   BUN 8 - 23 mg/dL _0 Creatinine 0.44 - 1.00 mg/dL 0.74  0.82  0.80   Sodium 135 - 145 mmol/L 137  136  137   Potassium 3.5 - 5.1 mmol/L 3.8  4.0  3.6   Chloride 98 - 111 mmol/L 104  106  105   CO2 22 - 32 mmol/L _1 Calcium 8.9 - 10.3 mg/dL 9.2  8.7  9.3   Total  Protein 6.5 - 8.1 g/dL 7.4  7.1  7.4   Total Bilirubin 0.3 - 1.2 mg/dL 0.4  0.5  0.4   Alkaline Phos 38 - 126 U/L 73  68  73   AST 15 - 41 U/L _0 ALT 0 - 44 U/L _1 DIAGNOSTIC IMAGING:  I have independently reviewed the scans and discussed with the patient. No results found.   ASSESSMENT:  1.  T3N3c (stage IIIc) triple negative invasive lobular carcinoma of the left breast: - She felt lump in her left breast for more than 6 months, but thought it was secondary to fibrocystic disease which she had all her life.  When she started having pain, she reached out to Dr. Sherrie Sport. - She previously had mammogram machine malfunction and had severe pain and traumatized by that experience.  She was having ultrasound of the breast every other year since then. - She was on hormone replacement therapy for close to 10 years (from age [age]). - Ultrasound of the left breast on 01/08/2021 showed hyper vascular hypoechoic mass at 12 o'clock position  measuring 3.8 x 1.3 x 2.5 cm.  There are calcifications located within the mass.  Mass extends to the level of the skin with mild associated skin thickening.  At least 3 morphologically abnormal lymph nodes in the left axilla. - Mammogram showed irregular spiculated mass with associated calcifications in the retroareolar to upper left breast measuring approximately 6 cm.  There are at least 4 morphologically abnormal lymph nodes identified in the left axilla. - Ultrasound-guided left breast and left axillary lymph node biopsy on 01/15/2021 - Pathology consistent with invasive lobular carcinoma, E-cadherin negative.  ER/PR/HER2 negative.  HER2 2+ by IHC, negative by FISH.  Ki-67 is 5%.  Lymph node core biopsy was consistent with metastatic carcinoma.  Grade 2. - PET scan on 02/03/2021 showed involvement of left supraclavicular, subpectoral, axillary lymph nodes along with the breast mass.  10 mm left lung nodule which is hypometabolic.  Spinal cord lesion at T12 level with a strong uptake. - MRI of the lumbar spine with and without contrast on 02/20/2021 showed no mass or abnormal enhancement within the canal at the T12 level to correspond to the site of PET scan positive.  No marrow replacing bone lesion. - 2D echo on 02/21/2021 with EF 55-60%. - Weekly carboplatin and paclitaxel with every 3 weeks pembrolizumab ([id number]) started on 02/27/2021. - Cycle 1 of AC on 07/04/2021.  Admission to [facility] with neutropenic fever. - Cycle 2 AC with 50% dose reduction on 08/21/2021. - 4 cycles of AC completed on 10/01/2021. - PET scan on 10/16/2021: Reduction in metabolic activity in the size of the left axillary lymph node and no evidence of breast hypermetabolism on PET scan. - Left mastectomy and lymph node excision by Dr. Ninfa Linden on 11/20/2021 - Pathology: 6.2 cm invasive lobular carcinoma, grade 2, margins negative.  19/21 lymph nodes involved with macrometastasis.  ypT3, YPN3A - Adjuvant pembrolizumab  started on 01/01/2022. -CREATE-X capecitabine 1500 mg twice daily 2 weeks on/1 week off started on 01/01/2022.     2.  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  She is non-smoker. - Mother died of breast cancer.  Maternal grandmother died very young in her 54s, sister has fibrocystic disease.  Father died of lung cancer and was a smoker.   PLAN:  1.  T3N3c triple negative invasive lobular carcinoma of the left breast: - She is taking Xeloda 3 tablets twice daily 2 weeks on/1 week off. - She does not report any immunotherapy related side effects. - Reviewed labs today which showed normal LFTs and CBC with ANC of 1.3. - She will proceed with Keytruda today. - RTC 3 weeks for follow-up.  Xeloda will start on 04/01/2022 as she is going to the beach.  2.  Hypothyroidism: - Continue Synthroid 75 mcg daily.  TSH today is 4.2.  3.  Peripheral neuropathy: - Numbness in the toes has cleared.  4.  Hypomagnesemia: - Continue magnesium twice daily.  5.  Grade 2 HFSR of the feet: - During the second week after last cycle, she has developed redness in the soles with some pain and skin peeling.  She could not walk on her feet. - She was soaking it in water.  I have told her to avoid soaking.  We will give her clobetasol cream twice daily and lidocaine cream twice daily.   Orders placed this encounter:  No orders of the defined types were placed in this encounter.    Denise Jack, MD [place] [phone]   I, Thana Ates, am acting as a scribe for Dr. Derek [name].  I, Denise Jack MD, have reviewed the above documentation for accuracy and completeness, and I agree with the above.

## 2022-03-26 NOTE — Patient Instructions (Addendum)
[place] at [facility] Discharge Instructions   You were seen and examined today by Dr. Delton Coombes.  He reviewed the results of your lab work which are normal/stable.   We will proceed with your treatment today.  We sent prescriptions for two different types of cream. Use these both twice a day when you notice redness or pain to your hands and feet.   Return as scheduled.    Thank you for choosing [place] at [facility] to provide your oncology and hematology care.  To afford each patient quality time with our provider, please arrive at least 15 minutes before your scheduled appointment time.   If you have a lab appointment with the [facility] please come in thru the Main Entrance and check in at the main information desk.  You need to re-schedule your appointment should you arrive 10 or more minutes late.  We strive to give you quality time with our providers, and arriving late affects you and other patients whose appointments are after yours.  Also, if you no show three or more times for appointments you may be dismissed from the clinic at the providers discretion.     Again, thank you for choosing [facility]  Our hope is that these requests will decrease the amount of time that you wait before being seen by our physicians.       _____________________________________________________________  Should you have questions after your visit to [facility], please contact our office at [phone] and follow the prompts.  Our office hours are 8:00 a.m. and 4:30 p.m. Monday - Friday.  Please note that voicemails left after 4:00 p.m. may not be returned until the following business day.  We are closed weekends and major holidays.  You do have access to a nurse 24-7, just call the main number to the clinic [phone] and do not press any options, hold on the line and a nurse will answer the phone.    For  prescription refill requests, have your pharmacy contact our office and allow 72 hours.    Due to Covid, you will need to wear a mask upon entering the hospital. If you do not have a mask, a mask will be given to you at the Main Entrance upon arrival. For doctor visits, patients may have 1 support person age 35 or older with them. For treatment visits, patients can not have anyone with them due to social distancing guidelines and our immunocompromised population.

## 2022-04-06 ENCOUNTER — Other Ambulatory Visit: Payer: Self-pay

## 2022-04-09 ENCOUNTER — Other Ambulatory Visit (HOSPITAL_COMMUNITY): Payer: Self-pay

## 2022-04-14 ENCOUNTER — Other Ambulatory Visit (HOSPITAL_COMMUNITY): Payer: Self-pay

## 2022-04-16 ENCOUNTER — Other Ambulatory Visit (HOSPITAL_COMMUNITY): Payer: Self-pay

## 2022-04-20 ENCOUNTER — Inpatient Hospital Stay (HOSPITAL_BASED_OUTPATIENT_CLINIC_OR_DEPARTMENT_OTHER): Payer: Medicare Other | Admitting: Hematology

## 2022-04-20 ENCOUNTER — Other Ambulatory Visit (HOSPITAL_COMMUNITY): Payer: Self-pay

## 2022-04-20 ENCOUNTER — Inpatient Hospital Stay: Payer: Medicare Other | Attending: Hematology

## 2022-04-20 ENCOUNTER — Inpatient Hospital Stay: Payer: Medicare Other

## 2022-04-20 ENCOUNTER — Other Ambulatory Visit (HOSPITAL_COMMUNITY): Payer: Self-pay | Admitting: Hematology

## 2022-04-20 VITALS — BP 134/72 | HR 55 | Temp 98.3°F | Resp 18

## 2022-04-20 VITALS — BP 137/74 | HR 63 | Temp 98.6°F | Resp 18 | Wt 185.0 lb

## 2022-04-20 DIAGNOSIS — Z9012 Acquired absence of left breast and nipple: Secondary | ICD-10-CM | POA: Diagnosis not present

## 2022-04-20 DIAGNOSIS — G629 Polyneuropathy, unspecified: Secondary | ICD-10-CM | POA: Diagnosis not present

## 2022-04-20 DIAGNOSIS — C50912 Malignant neoplasm of unspecified site of left female breast: Secondary | ICD-10-CM | POA: Insufficient documentation

## 2022-04-20 DIAGNOSIS — Z79890 Hormone replacement therapy (postmenopausal): Secondary | ICD-10-CM | POA: Insufficient documentation

## 2022-04-20 DIAGNOSIS — Z79899 Other long term (current) drug therapy: Secondary | ICD-10-CM | POA: Insufficient documentation

## 2022-04-20 DIAGNOSIS — Z803 Family history of malignant neoplasm of breast: Secondary | ICD-10-CM | POA: Insufficient documentation

## 2022-04-20 DIAGNOSIS — E039 Hypothyroidism, unspecified: Secondary | ICD-10-CM | POA: Diagnosis not present

## 2022-04-20 DIAGNOSIS — Z171 Estrogen receptor negative status [ER-]: Secondary | ICD-10-CM | POA: Insufficient documentation

## 2022-04-20 DIAGNOSIS — I10 Essential (primary) hypertension: Secondary | ICD-10-CM | POA: Diagnosis not present

## 2022-04-20 DIAGNOSIS — J45909 Unspecified asthma, uncomplicated: Secondary | ICD-10-CM | POA: Insufficient documentation

## 2022-04-20 DIAGNOSIS — Z95828 Presence of other vascular implants and grafts: Secondary | ICD-10-CM

## 2022-04-20 LAB — CBC WITH DIFFERENTIAL/PLATELET
Abs Immature Granulocytes: 0.01 10*3/uL (ref 0.00–0.07)
Basophils Absolute: 0 10*3/uL (ref 0.0–0.1)
Basophils Relative: 0 %
Eosinophils Absolute: 0.1 10*3/uL (ref 0.0–0.5)
Eosinophils Relative: 5 %
HCT: 31.7 % — ABNORMAL LOW (ref 36.0–46.0)
Hemoglobin: 10.8 g/dL — ABNORMAL LOW (ref 12.0–15.0)
Immature Granulocytes: 0 %
Lymphocytes Relative: 38 %
Lymphs Abs: 1.1 10*3/uL (ref 0.7–4.0)
MCH: 37.6 pg — ABNORMAL HIGH (ref 26.0–34.0)
MCHC: 34.1 g/dL (ref 30.0–36.0)
MCV: 110.5 fL — ABNORMAL HIGH (ref 80.0–100.0)
Monocytes Absolute: 0.3 10*3/uL (ref 0.1–1.0)
Monocytes Relative: 11 %
Neutro Abs: 1.3 10*3/uL — ABNORMAL LOW (ref 1.7–7.7)
Neutrophils Relative %: 46 %
Platelets: 171 10*3/uL (ref 150–400)
RBC: 2.87 MIL/uL — ABNORMAL LOW (ref 3.87–5.11)
RDW: 16.1 % — ABNORMAL HIGH (ref 11.5–15.5)
WBC: 2.8 10*3/uL — ABNORMAL LOW (ref 4.0–10.5)
nRBC: 0 % (ref 0.0–0.2)

## 2022-04-20 LAB — COMPREHENSIVE METABOLIC PANEL
ALT: 14 U/L (ref 0–44)
AST: 18 U/L (ref 15–41)
Albumin: 3.8 g/dL (ref 3.5–5.0)
Alkaline Phosphatase: 75 U/L (ref 38–126)
Anion gap: 5 (ref 5–15)
BUN: 18 mg/dL (ref 8–23)
CO2: 26 mmol/L (ref 22–32)
Calcium: 9 mg/dL (ref 8.9–10.3)
Chloride: 108 mmol/L (ref 98–111)
Creatinine, Ser: 0.74 mg/dL (ref 0.44–1.00)
GFR, Estimated: >60 mL/min (ref >60)
Glucose, Bld: 98 mg/dL (ref 70–99)
Potassium: 3.8 mmol/L (ref 3.5–5.1)
Sodium: 139 mmol/L (ref 135–145)
Total Bilirubin: 0.7 mg/dL (ref 0.3–1.2)
Total Protein: 7 g/dL (ref 6.5–8.1)

## 2022-04-20 LAB — TSH: TSH: 4.265 u[IU]/mL (ref 0.350–4.500)

## 2022-04-20 LAB — MAGNESIUM: Magnesium: 1.8 mg/dL (ref 1.7–2.4)

## 2022-04-20 MED ORDER — SODIUM CHLORIDE 0.9 % IV SOLN: Freq: Once | INTRAVENOUS | Status: AC

## 2022-04-20 MED ORDER — SODIUM CHLORIDE 0.9% FLUSH
10.0000 mL | INTRAVENOUS | Status: DC | PRN
  Administered 2022-04-20 (×2): 10 mL

## 2022-04-20 MED ORDER — SODIUM CHLORIDE 0.9 % IV SOLN
200.0000 mg | Freq: Once | INTRAVENOUS | Status: AC
  Administered 2022-04-20: 200 mg via INTRAVENOUS
  Filled 2022-04-20: qty 8

## 2022-04-20 MED ORDER — HEPARIN SOD (PORK) LOCK FLUSH 100 UNIT/ML IV SOLN
500.0000 [IU] | Freq: Once | INTRAVENOUS | Status: AC | PRN
  Administered 2022-04-20: 500 [IU]

## 2022-04-20 MED ORDER — CAPECITABINE 500 MG PO TABS
1500.0000 mg | ORAL_TABLET | Freq: Two times a day (BID) | ORAL | 0 refills | Status: DC
  Filled 2022-04-20: qty 84, 21d supply, fill #0

## 2022-04-20 NOTE — Progress Notes (Signed)
Xeloda refill approved.  As mentioned, patient is tolerating and is to continue therapy.

## 2022-04-20 NOTE — Progress Notes (Signed)
Patient is taking Xeloda as prescribed.  She has not missed any doses and reports no side effects at this time.

## 2022-04-20 NOTE — Progress Notes (Signed)
Ok to proceed without differential reported out.  T.O. Dr Rhys Martini, PharmD

## 2022-04-20 NOTE — Patient Instructions (Signed)
[facility]  Discharge Instructions: Thank you for choosing [facility] to provide your oncology and hematology care.  If you have a lab appointment with the [facility], please come in thru the Main Entrance and check in at the main information desk.  Wear comfortable clothing and clothing appropriate for easy access to any Portacath or PICC line.   We strive to give you quality time with your provider. You may need to reschedule your appointment if you arrive late (15 or more minutes).  Arriving late affects you and other patients whose appointments are after yours.  Also, if you miss three or more appointments without notifying the office, you may be dismissed from the clinic at the provider's discretion.      For prescription refill requests, have your pharmacy contact our office and allow 72 hours for refills to be completed.    Today you received the following chemotherapy and/or immunotherapy agents Keytruda, return as scheduled.   To help prevent nausea and vomiting after your treatment, we encourage you to take your nausea medication as directed.  BELOW ARE SYMPTOMS THAT SHOULD BE REPORTED IMMEDIATELY: *FEVER GREATER THAN 100.4 F (38 C) OR HIGHER *CHILLS OR SWEATING *NAUSEA AND VOMITING THAT IS NOT CONTROLLED WITH YOUR NAUSEA MEDICATION *UNUSUAL SHORTNESS OF BREATH *UNUSUAL BRUISING OR BLEEDING *URINARY PROBLEMS (pain or burning when urinating, or frequent urination) *BOWEL PROBLEMS (unusual diarrhea, constipation, pain near the anus) TENDERNESS IN MOUTH AND THROAT WITH OR WITHOUT PRESENCE OF ULCERS (sore throat, sores in mouth, or a toothache) UNUSUAL RASH, SWELLING OR PAIN  UNUSUAL VAGINAL DISCHARGE OR ITCHING   Items with * indicate a potential emergency and should be followed up as soon as possible or go to the Emergency Department if any problems should occur.  Please show the CHEMOTHERAPY ALERT CARD or IMMUNOTHERAPY ALERT CARD at  check-in to the Emergency Department and triage nurse.  Should you have questions after your visit or need to cancel or reschedule your appointment, please contact [facility] [phone]  and follow the prompts.  Office hours are 8:00 a.m. to 4:30 p.m. Monday - Friday. Please note that voicemails left after 4:00 p.m. may not be returned until the following business day.  We are closed weekends and major holidays. You have access to a nurse at all times for urgent questions. Please call the main number to the clinic [phone] and follow the prompts.  For any non-urgent questions, you may also contact your provider using MyChart. We now offer e-Visits for anyone 73 and older to request care online for non-urgent symptoms. For details visit [email or web address].   Also download the MyChart app! Go to the app store, search "MyChart", open the app, select , and log in with your MyChart username and password.  Masks are optional in the cancer centers. If you would like for your care team to wear a mask while they are taking care of you, please let them know. For doctor visits, patients may have with them one support person who is at least 73 years old. At this time, visitors are not allowed in the infusion area.

## 2022-04-20 NOTE — Progress Notes (Signed)
Denise [name],  [id number]   CLINIC:  Medical Oncology/Hematology  PCP:  Neale Burly, MD [place] / [place] [address] [phone]   REASON FOR VISIT:  Follow-up for T3N3c (stage IIIc) triple negative invasive lobular carcinoma of the left breast  PRIOR THERAPY: Left mastectomy and lymph node excision by Dr. Ninfa Linden on 11/20/2021  NGS Results: not done  CURRENT THERAPY: Pembrolizumab + Carboplatin D1,8,15+ Paclitaxel D1,8,15 q21d X 4 cycles / Pembrolizumab + AC q21d x 4 cycles  BRIEF ONCOLOGIC HISTORY:  Oncology History  Invasive lobular carcinoma of left breast in female [facility])  01/23/2021 Initial Diagnosis   Invasive lobular carcinoma of left breast in female [facility])   02/19/2021 Genetic Testing   Negative genetic testing on the CancerNext-Expanded+RNAinsight panel.  SMARCB1 VUS identified.  The CancerNext-Expanded gene panel offered by [facility] and includes sequencing and rearrangement analysis for the following 77 genes: AIP, ALK, APC*, ATM*, AXIN2, BAP1, BARD1, BLM, BMPR1A, BRCA1*, BRCA2*, BRIP1*, CDC73, CDH1*, CDK4, CDKN1B, CDKN2A, CHEK2*, CTNNA1, DICER1, FANCC, FH, FLCN, GALNT12, KIF1B, LZTR1, MAX, MEN1, MET, MLH1*, MSH2*, MSH3, MSH6*, MUTYH*, NBN, NF1*, NF2, NTHL1, PALB2*, PHOX2B, PMS2*, POT1, PRKAR1A, PTCH1, PTEN*, RAD51C*, RAD51D*, RB1, RECQL, RET, SDHA, SDHAF2, SDHB, SDHC, SDHD, SMAD4, SMARCA4, SMARCB1, SMARCE1, STK11, SUFU, TMEM127, TP53*, TSC1, TSC2, VHL and XRCC2 (sequencing and deletion/duplication); EGFR, EGLN1, HOXB13, KIT, MITF, PDGFRA, POLD1, and POLE (sequencing only); EPCAM and GREM1 (deletion/duplication only). DNA and RNA analyses performed for * genes. The report date is February 19, 2021.   02/27/2021 -  Chemotherapy   Patient is on Treatment Plan : BREAST Pembrolizumab + Carboplatin D1,8,15+ Paclitaxel D1,8,15 q21d X 4 cycles / Pembrolizumab + AC q21d x 4 cycles     12/22/2021 Cancer Staging   Staging form:  Breast, AJCC 8th Edition - Pathologic stage from 12/22/2021: No Stage Recommended (ypT3, pN3a, cM0, G2, ER-, PR-, HER2-) - Signed by Derek Jack, MD on 12/22/2021 Histopathologic type: Lobular carcinoma, NOS Stage prefix: Post-therapy Method of lymph node assessment: Axillary lymph node dissection Multigene prognostic tests performed: None Histologic grading system: 3 grade system     CANCER STAGING:  Cancer Staging  Invasive lobular carcinoma of left breast in female [facility]) Staging form: Breast, AJCC 8th Edition - Clinical stage from 01/23/2021: cT3, cN3c, G2, ER-, PR-, HER2- - Unsigned - Pathologic stage from 12/22/2021: No Stage Recommended (ypT3, pN3a, cM0, G2, ER-, PR-, HER2-) - Signed by Derek Jack, MD on 12/22/2021   INTERVAL HISTORY:  Denise [name], a 71 y.o. female, seen for follow-up of breast cancer.  She has taken Xeloda 3 tablets twice daily 2 weeks on/1 week off on 04/01/2022.  She is off week.  She has some peeling of skin of hands.  No erythema or blistering or pain in the soles noted.  No fevers or infections.  Overall she had [place] time [place] the beach.   REVIEW OF SYSTEMS:  Review of Systems  Constitutional:  Negative for appetite change and fatigue.  HENT:   Negative for mouth sores.   Respiratory:  Negative for cough and shortness of breath.   Gastrointestinal:  Negative for diarrhea, nausea and vomiting.  Skin:  Negative for rash.       Peeling - feet  Neurological:  Positive for dizziness and numbness (toes). Negative for headaches.  All other systems reviewed and are negative.   PAST MEDICAL/SURGICAL HISTORY:  Past Medical History:  Diagnosis Date  Asthma    as child   Cancer [facility]) 12/2020   left breast IMC   Complication of anesthesia    pateitn states,' I coded when I had my D&Cmany years ago.   Dyspnea    Family history of breast cancer    Hypertension    Hypothyroidism    PONV (postoperative nausea and vomiting)     Port-A-Cath in place 02/26/2021   Past Surgical History:  Procedure Laterality Date   ABDOMINAL HYSTERECTOMY     BIOPSY  01/17/2018   Procedure: BIOPSY;  Surgeon: Rogene Houston, MD;  [facility];  Service: Endoscopy;;  duodenum,gastric   CHOLECYSTECTOMY     COLONOSCOPY WITH PROPOFOL N/A 01/17/2018   Procedure: COLONOSCOPY WITH PROPOFOL;  Surgeon: Rogene Houston, MD;  [facility];  Service: Endoscopy;  Laterality: N/A;  7:30   DILATION AND CURETTAGE OF UTERUS     ESOPHAGOGASTRODUODENOSCOPY (EGD) WITH PROPOFOL N/A 01/17/2018   Procedure: ESOPHAGOGASTRODUODENOSCOPY (EGD) WITH PROPOFOL;  Surgeon: Rogene Houston, MD;  [facility];  Service: Endoscopy;  Laterality: N/A;   POLYPECTOMY  01/17/2018   Procedure: POLYPECTOMY;  Surgeon: Rogene Houston, MD;  [facility];  Service: Endoscopy;;  transverse colon, cecal   PORTACATH PLACEMENT Right 02/17/2021   Procedure: INSERTION PORT-A-CATH;  Surgeon: Coralie Keens, MD;  Location: [place];  Service: General;  Laterality: Right;   RADIOACTIVE SEED GUIDED AXILLARY SENTINEL LYMPH NODE Left 11/20/2021   Procedure: RADIOACTIVE SEED GUIDED LEFT AXILLARY SENTINEL LYMPH NODE DISSECTION;  Surgeon: Coralie Keens, MD;  Location: [place];  Service: General;  Laterality: Left;   TOTAL MASTECTOMY Left 11/20/2021   Procedure: LEFT TOTAL MASTECTOMY;  Surgeon: Coralie Keens, MD;  Location: [place];  Service: General;  Laterality: Left;    SOCIAL HISTORY:  Social History   Socioeconomic History   Marital status: Widowed    Spouse name: Not on file   Number of children: 3   Years of education: Not on file   Highest education level: Not on file  Occupational History   Occupation: [employer]   Occupation: retired  Tobacco Use   Smoking status: Never   Smokeless tobacco: Never  Vaping Use   Vaping Use: Never used  Substance and Sexual  Activity   Alcohol use: Never   Drug use: Never   Sexual activity: Not Currently    Birth control/protection: Surgical  Other Topics Concern   Not on file  Social History Narrative   Not on file   Social Determinants of Health   Financial Resource Strain: [place]  (01/22/2021)   Overall Financial Resource Strain (CARDIA)    Difficulty of Paying Living Expenses: Not hard at all  Food Insecurity: No [place] (01/22/2021)   Hunger Vital Sign    Worried About Running Out of Food in the Last Year: Never true    [place] in the Last Year: Never true  Transportation Needs: No Transportation Needs (01/22/2021)   PRAPARE - Hydrologist (Medical): No    Lack of Transportation (Non-Medical): No  Physical Activity: Insufficiently Active (01/22/2021)   Exercise Vital Sign    Days of Exercise per Week: 3 days    Minutes of Exercise per Session: 30 min  Stress: No Stress Concern Present (01/22/2021)   [place]    Feeling of Stress : Not  at all  Social Connections: Moderately Integrated (01/22/2021)   Social Connection and Isolation Panel [NHANES]    Frequency of Communication with Friends and Family: More than three times a week    Frequency of Social Gatherings with Friends and Family: More than three times a week    Attends Religious Services: More than 4 times per year    Active Member of [employer] or Organizations: No    Attends Music therapist: More than 4 times per year    Marital Status: Widowed  Intimate Partner Violence: Not At Risk (01/22/2021)   Humiliation, Afraid, Rape, and Kick questionnaire    Fear of Current or Ex-Partner: No    Emotionally Abused: No    Physically Abused: No    Sexually Abused: No    FAMILY HISTORY:  Family History  Problem Relation Age of Onset   Breast cancer Mother        dx in her 72s   Heart disease Mother    Thyroid disease  Mother    Lung cancer Father        dx in his 57s   Thyroid disease Maternal Aunt    Thyroid nodules Maternal Grandmother 35       goiter   Thyroid disease Maternal Grandmother    Heart disease Maternal Grandfather    Heart disease Paternal Grandmother    Heart disease Paternal Grandfather    Thyroid disease Daughter    Thyroid disease Daughter    Cancer Maternal Uncle        NOS   Cancer Paternal Uncle        NOS   Breast cancer Cousin        pat first cousin died in her 46s;     CURRENT MEDICATIONS:  Current Outpatient Medications  Medication Sig Dispense Refill   Acetaminophen (TYLENOL PO) Take 1 tablet by mouth as needed.     albuterol (VENTOLIN HFA) 108 (90 Base) MCG/ACT inhaler Inhale into the lungs.     benazepril (LOTENSIN) 20 MG tablet Take by mouth.     clobetasol cream (TEMOVATE) 2.95 % Apply 1 Application topically 2 (two) times daily. Apply to hands and feet 30 g 3   filgrastim (NEUPOGEN) 480 MCG/0.8ML SOSY injection Inject 0.8 mLs (480 mcg total) into the skin daily as needed for up to 8 doses (self-administer subcutaneously as directed by Dr. Delton Coombes as needed for neutropenia). 6.4 mL 0   gabapentin (NEURONTIN) 300 MG capsule Take 1 capsule by mouth at bedtime.     levothyroxine (SYNTHROID) 75 MCG tablet Take 100 mcg by mouth daily before breakfast.     lidocaine (XYLOCAINE) 5 % ointment Apply 1 Application topically 2 (two) times daily. Apply to hands and feet 35.44 g 2   lidocaine-prilocaine (EMLA) cream Apply a small amount to port a cath site and cover with plastic wrap 1 hour prior to infusion appointments 30 g 3   LORazepam (ATIVAN) 0.5 MG tablet Take 0.5 mg by mouth daily as needed.     losartan (COZAAR) 50 MG tablet Take 50 mg by mouth daily.     magnesium oxide (MAG-OX) 400 MG tablet Take 1 tablet (400 mg total) by mouth 2 (two) times daily. 60 tablet 6   Melatonin 5 MG TABS Take 10 mg by mouth. As needed for sleep     nitrofurantoin,  macrocrystal-monohydrate, (MACROBID) 100 MG capsule Take 100 mg by mouth 2 (two) times daily.     Omega-3 Fatty Acids (FISH  OIL PO) Take by mouth.     Pembrolizumab (KEYTRUDA IV) Inject into the vein every 21 ( twenty-one) days.     prochlorperazine (COMPAZINE) 10 MG tablet Take by mouth.     Tiotropium Bromide-Olodaterol (STIOLTO RESPIMAT) 2.5-2.5 MCG/ACT AERS Inhale 1-2 puffs into the lungs as directed.     capecitabine (XELODA) 500 MG tablet Take 3 tablets (1,500 mg total) by mouth 2 (two) times daily after a meal. Take for 14 days, then hold for 7 days. Repeat every 21 days. 84 tablet 0   No current facility-administered medications for this visit.   Facility-Administered Medications Ordered in Other Visits  Medication Dose Route Frequency Provider Last Rate Last Admin   sodium chloride flush (NS) 0.9 % injection 10 mL  10 mL Intracatheter PRN Derek Jack, MD   10 mL at 04/20/22 1217    ALLERGIES:  Allergies  Allergen Reactions   Exforge [Amlodipine Besylate-Valsartan] Nausea And Vomiting   Tape Other (See Comments)    Redness and Irriatation   Cheese     Hard cheese   Levofloxacin In D5w Hives   Strawberry Extract Diarrhea    Seeds,nuts, lettuce, grapes   Tramadol Hcl Nausea And Vomiting   Yeast-Related Products Hives    Mold on bread    PHYSICAL EXAM:  Performance status (ECOG): 1 - Symptomatic but completely ambulatory  Vitals:   04/20/22 0945 04/20/22 1003  BP: (!) 154/87 137/74  Pulse: 63   Resp: 18   Temp: 98.6 F (37 C)   SpO2: 100%    Wt Readings from Last 3 Encounters:  04/20/22 185 lb (83.9 kg)  03/26/22 183 lb 9.6 oz (83.3 kg)  03/05/22 181 lb (82.1 kg)   Physical Exam Vitals reviewed.  Constitutional:      Appearance: Normal appearance.  Cardiovascular:     Rate and Rhythm: Normal rate and regular rhythm.     Pulses: Normal pulses.     Heart sounds: Normal heart sounds.  Pulmonary:     Effort: Pulmonary effort is normal.     Breath  sounds: Normal breath sounds.  Skin:    Comments: Skin peeling on L great toe  Neurological:     General: No focal deficit present.     Mental Status: She is alert and oriented to person, place, and time.  Psychiatric:        Mood and Affect: Mood normal.        Behavior: Behavior normal.     LABORATORY DATA:  I have reviewed the labs as listed.     Latest Ref Rng & Units 04/20/2022    9:46 AM 03/26/2022    8:36 AM 03/05/2022   11:35 AM  CBC  WBC 4.0 - 10.5 K/uL 2.8  3.4  4.1   Hemoglobin 12.0 - 15.0 g/dL 10.8  11.1  11.5   Hematocrit 36.0 - 46.0 % 31.7  32.3  33.7   Platelets 150 - 400 K/uL 171  166  201       Latest Ref Rng & Units 04/20/2022    9:46 AM 03/26/2022    8:36 AM 03/05/2022   11:35 AM  CMP  Glucose 70 - 99 mg/dL 98  98  96   BUN 8 - 23 mg/dL '18  20  19   ' Creatinine 0.44 - 1.00 mg/dL 0.74  0.79  0.74   Sodium 135 - 145 mmol/L 139  135  137   Potassium 3.5 - 5.1 mmol/L 3.8  3.7  3.8   Chloride 98 - 111 mmol/L 108  105  104   CO2 22 - 32 mmol/L '26  25  28   ' Calcium 8.9 - 10.3 mg/dL 9.0  8.7  9.2   Total Protein 6.5 - 8.1 g/dL 7.0  6.9  7.4   Total Bilirubin 0.3 - 1.2 mg/dL 0.7  0.7  0.4   Alkaline Phos 38 - 126 U/L 75  75  73   AST 15 - 41 U/L '18  23  19   ' ALT 0 - 44 U/L '14  15  14     ' DIAGNOSTIC IMAGING:  I have independently reviewed the scans and discussed with the patient. No results found.   ASSESSMENT:  1.  T3N3c (stage IIIc) triple negative invasive lobular carcinoma of the left breast: - She felt lump in her left breast for more than 6 months, but thought it was secondary to fibrocystic disease which she had all her life.  When she started having pain, she reached out to Dr. Sherrie Sport. - She previously had mammogram machine malfunction and had severe pain and traumatized by that experience.  She was having ultrasound of the breast every other year since then. - She was on hormone replacement therapy for close to 10 years (from age [age]). - Ultrasound  of the left breast on 01/08/2021 showed hyper vascular hypoechoic mass at 12 o'clock position measuring 3.8 x 1.3 x 2.5 cm.  There are calcifications located within the mass.  Mass extends to the level of the skin with mild associated skin thickening.  At least 3 morphologically abnormal lymph nodes in the left axilla. - Mammogram showed irregular spiculated mass with associated calcifications in the retroareolar to upper left breast measuring approximately 6 cm.  There are at least 4 morphologically abnormal lymph nodes identified in the left axilla. - Ultrasound-guided left breast and left axillary lymph node biopsy on 01/15/2021 - Pathology consistent with invasive lobular carcinoma, E-cadherin negative.  ER/PR/HER2 negative.  HER2 2+ by IHC, negative by FISH.  Ki-67 is 5%.  Lymph node core biopsy was consistent with metastatic carcinoma.  Grade 2. - PET scan on 02/03/2021 showed involvement of left supraclavicular, subpectoral, axillary lymph nodes along with the breast mass.  10 mm left lung nodule which is hypometabolic.  Spinal cord lesion at T12 level with a strong uptake. - MRI of the lumbar spine with and without contrast on 02/20/2021 showed no mass or abnormal enhancement within the canal at the T12 level to correspond to the site of PET scan positive.  No marrow replacing bone lesion. - 2D echo on 02/21/2021 with EF 55-60%. - Weekly carboplatin and paclitaxel with every 3 weeks pembrolizumab (keynote-[id number]) started on 02/27/2021. - Cycle 1 of AC on 07/04/2021.  Admission to [facility] with neutropenic fever. - Cycle 2 AC with 50% dose reduction on 08/21/2021. - 4 cycles of AC completed on 10/01/2021. - PET scan on 10/16/2021: Reduction in metabolic activity in the size of the left axillary lymph node and no evidence of breast hypermetabolism on PET scan. - Left mastectomy and lymph node excision by Dr. Ninfa Linden on 11/20/2021 - Pathology: 6.2 cm invasive lobular carcinoma, grade 2, margins negative.   19/21 lymph nodes involved with macrometastasis.  ypT3, YPN3A - Adjuvant pembrolizumab started on 01/01/2022. -CREATE-X capecitabine 1500 mg twice daily 2 weeks on/1 week off started on 01/01/2022.     2.  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  She is non-smoker. - Mother died of breast cancer.  Maternal grandmother died very young in her 8s, sister has fibrocystic disease.  Father died of lung cancer and was a smoker.   PLAN:  1.  T3N3c triple negative invasive lobular carcinoma of the left breast: - She is off of Xeloda for the last few days. - Reviewed labs today which showed normal LFTs and electrolytes.  CBC shows white count 2.8 with ANC 1.3.  Platelet count is normal. - She will start her next cycle on 04/22/2022 with Xeloda 3 pills 2 weeks on/1 week off. - She will proceed with [facility] today as she is not having any immunotherapy related side effects. - RTC 3 weeks for follow-up.  2.  Hypothyroidism: - Continue Synthroid 75 mcg daily.  TSH is 4.2.  3.  Peripheral neuropathy: - Numbness in the toes has been stable.  4.  Hypomagnesemia: - Continue magnesium twice daily.  Magnesium is normal.  5.  Grade 2 HFSR of the feet: - After last cycle, she had mild peeling of the skin in the palms.  No erythema, pain or peeling of skin in the soles. - I have advised her to wear gloves when she is washing dishes.   Orders placed this encounter:  No orders of the defined types were placed in this encounter.    Derek Jack, MD [place] [phone]   I, Thana Ates, am acting as a scribe for Dr. Derek Jack.  I, Derek Jack MD, have reviewed the above documentation for accuracy and completeness, and I agree with the above.

## 2022-04-20 NOTE — Patient Instructions (Addendum)
[place] at [facility] Discharge Instructions   You were seen and examined today by Dr. Delton Coombes.  He reviewed the results of your lab work. Your white count is low. We are awaiting the results of your white blood cell breakdown. All other results are normal/stable.    We will proceed with your treatment today.   Return as scheduled in 3 weeks.    Thank you for choosing [place] at [facility] to provide your oncology and hematology care.  To afford each patient quality time with our provider, please arrive at least 15 minutes before your scheduled appointment time.   If you have a lab appointment with the [facility] please come in thru the Main Entrance and check in at the main information desk.  You need to re-schedule your appointment should you arrive 10 or more minutes late.  We strive to give you quality time with our providers, and arriving late affects you and other patients whose appointments are after yours.  Also, if you no show three or more times for appointments you may be dismissed from the clinic at the providers discretion.     Again, thank you for choosing [facility].  Our hope is that these requests will decrease the amount of time that you wait before being seen by our physicians.       _____________________________________________________________  Should you have questions after your visit to [facility], please contact our office at [phone] and follow the prompts.  Our office hours are 8:00 a.m. and 4:30 p.m. Monday - Friday.  Please note that voicemails left after 4:00 p.m. may not be returned until the following business day.  We are closed weekends and major holidays.  You do have access to a nurse 24-7, just call the main number to the clinic [phone] and do not press any options, hold on the line and a nurse will answer the phone.    For prescription refill requests, have  your pharmacy contact our office and allow 72 hours.    Due to Covid, you will need to wear a mask upon entering the hospital. If you do not have a mask, a mask will be given to you at the Main Entrance upon arrival. For doctor visits, patients may have 1 support person age 73 or older with them. For treatment visits, patients can not have anyone with them due to social distancing guidelines and our immunocompromised population.

## 2022-04-20 NOTE — Progress Notes (Signed)
Patient presents today for Keytruda, [place] 1.3 patient okay for treatment per Dr. Delton Coombes.  Patient tolerated therapy with no complaints voiced. Side effects with management reviewed with understanding verbalized. Port site clean and dry with no bruising or swelling noted at site. Good blood return noted before and after administration of therapy. Band aid applied. Patient left in satisfactory condition with VSS and no s/s of distress noted.

## 2022-04-21 ENCOUNTER — Other Ambulatory Visit: Payer: Self-pay

## 2022-04-22 ENCOUNTER — Other Ambulatory Visit (HOSPITAL_COMMUNITY): Payer: Self-pay

## 2022-04-22 DIAGNOSIS — M71552 Other bursitis, not elsewhere classified, left hip: Secondary | ICD-10-CM | POA: Diagnosis not present

## 2022-04-22 DIAGNOSIS — Z6832 Body mass index (BMI) 32.0-32.9, adult: Secondary | ICD-10-CM | POA: Diagnosis not present

## 2022-04-22 DIAGNOSIS — M461 Sacroiliitis, not elsewhere classified: Secondary | ICD-10-CM | POA: Diagnosis not present

## 2022-04-22 DIAGNOSIS — M25552 Pain in left hip: Secondary | ICD-10-CM | POA: Diagnosis not present

## 2022-04-24 ENCOUNTER — Other Ambulatory Visit: Payer: Self-pay

## 2022-04-29 ENCOUNTER — Telehealth: Payer: Self-pay | Admitting: *Deleted

## 2022-04-29 NOTE — Telephone Encounter (Signed)
Patient called with c/o of severe left hip pain that is limiting her ability to bear weight.  She is having to use walker to move about.  States walker has caused lymphedema, which is painful as well.  She spoke with her PCP about this on Monday and had a x ray of L hip that showed degenerative changes.  PCP treating discomfort with pain patches. This is likely as a result of her treatment. Will place an orthopedic referral for her.  Also states that peeling has worsened on the left foot and has been using utter cream without resolve.  Advised that she needs to use the hydrocortisone cream as she has been advised to do in the past.  Patient aware of referral and verbalized understanding.

## 2022-05-04 ENCOUNTER — Other Ambulatory Visit (HOSPITAL_COMMUNITY): Payer: Self-pay

## 2022-05-06 ENCOUNTER — Other Ambulatory Visit: Payer: Self-pay

## 2022-05-08 ENCOUNTER — Other Ambulatory Visit (HOSPITAL_COMMUNITY): Payer: Self-pay

## 2022-05-08 ENCOUNTER — Other Ambulatory Visit (HOSPITAL_COMMUNITY): Payer: Self-pay | Admitting: Hematology

## 2022-05-08 DIAGNOSIS — C50912 Malignant neoplasm of unspecified site of left female breast: Secondary | ICD-10-CM

## 2022-05-11 ENCOUNTER — Other Ambulatory Visit (HOSPITAL_COMMUNITY): Payer: Self-pay

## 2022-05-11 ENCOUNTER — Inpatient Hospital Stay: Payer: Medicare Other

## 2022-05-11 ENCOUNTER — Encounter: Payer: Self-pay | Admitting: Hematology

## 2022-05-11 ENCOUNTER — Encounter (HOSPITAL_COMMUNITY): Payer: Self-pay | Admitting: Hematology

## 2022-05-11 ENCOUNTER — Inpatient Hospital Stay (HOSPITAL_BASED_OUTPATIENT_CLINIC_OR_DEPARTMENT_OTHER): Payer: Medicare Other | Admitting: Hematology

## 2022-05-11 ENCOUNTER — Other Ambulatory Visit: Payer: Self-pay | Admitting: *Deleted

## 2022-05-11 VITALS — BP 133/68 | HR 58 | Temp 98.4°F | Resp 20

## 2022-05-11 DIAGNOSIS — J45909 Unspecified asthma, uncomplicated: Secondary | ICD-10-CM | POA: Diagnosis not present

## 2022-05-11 DIAGNOSIS — Z95828 Presence of other vascular implants and grafts: Secondary | ICD-10-CM

## 2022-05-11 DIAGNOSIS — G629 Polyneuropathy, unspecified: Secondary | ICD-10-CM | POA: Diagnosis not present

## 2022-05-11 DIAGNOSIS — R519 Headache, unspecified: Secondary | ICD-10-CM | POA: Diagnosis not present

## 2022-05-11 DIAGNOSIS — E039 Hypothyroidism, unspecified: Secondary | ICD-10-CM | POA: Diagnosis not present

## 2022-05-11 DIAGNOSIS — M25552 Pain in left hip: Secondary | ICD-10-CM

## 2022-05-11 DIAGNOSIS — C50912 Malignant neoplasm of unspecified site of left female breast: Secondary | ICD-10-CM | POA: Diagnosis not present

## 2022-05-11 DIAGNOSIS — Z171 Estrogen receptor negative status [ER-]: Secondary | ICD-10-CM | POA: Diagnosis not present

## 2022-05-11 LAB — CBC WITH DIFFERENTIAL/PLATELET
Abs Immature Granulocytes: 0.01 10*3/uL (ref 0.00–0.07)
Basophils Absolute: 0 10*3/uL (ref 0.0–0.1)
Basophils Relative: 0 %
Eosinophils Absolute: 0.1 10*3/uL (ref 0.0–0.5)
Eosinophils Relative: 3 %
HCT: 32.4 % — ABNORMAL LOW (ref 36.0–46.0)
Hemoglobin: 11.2 g/dL — ABNORMAL LOW (ref 12.0–15.0)
Immature Granulocytes: 0 %
Lymphocytes Relative: 40 %
Lymphs Abs: 1.3 10*3/uL (ref 0.7–4.0)
MCH: 38.5 pg — ABNORMAL HIGH (ref 26.0–34.0)
MCHC: 34.6 g/dL (ref 30.0–36.0)
MCV: 111.3 fL — ABNORMAL HIGH (ref 80.0–100.0)
Monocytes Absolute: 0.4 10*3/uL (ref 0.1–1.0)
Monocytes Relative: 11 %
Neutro Abs: 1.5 10*3/uL — ABNORMAL LOW (ref 1.7–7.7)
Neutrophils Relative %: 46 %
Platelets: 195 10*3/uL (ref 150–400)
RBC: 2.91 MIL/uL — ABNORMAL LOW (ref 3.87–5.11)
RDW: 14.9 % (ref 11.5–15.5)
WBC: 3.2 10*3/uL — ABNORMAL LOW (ref 4.0–10.5)
nRBC: 0 % (ref 0.0–0.2)

## 2022-05-11 LAB — COMPREHENSIVE METABOLIC PANEL
ALT: 13 U/L (ref 0–44)
AST: 19 U/L (ref 15–41)
Albumin: 4 g/dL (ref 3.5–5.0)
Alkaline Phosphatase: 75 U/L (ref 38–126)
Anion gap: 6 (ref 5–15)
BUN: 22 mg/dL (ref 8–23)
CO2: 23 mmol/L (ref 22–32)
Calcium: 8.9 mg/dL (ref 8.9–10.3)
Chloride: 108 mmol/L (ref 98–111)
Creatinine, Ser: 0.8 mg/dL (ref 0.44–1.00)
GFR, Estimated: >60 mL/min (ref >60)
Glucose, Bld: 97 mg/dL (ref 70–99)
Potassium: 4.1 mmol/L (ref 3.5–5.1)
Sodium: 137 mmol/L (ref 135–145)
Total Bilirubin: 0.5 mg/dL (ref 0.3–1.2)
Total Protein: 7.4 g/dL (ref 6.5–8.1)

## 2022-05-11 LAB — MAGNESIUM: Magnesium: 1.8 mg/dL (ref 1.7–2.4)

## 2022-05-11 MED ORDER — SODIUM CHLORIDE 0.9 % IV SOLN: Freq: Once | INTRAVENOUS | Status: AC

## 2022-05-11 MED ORDER — SODIUM CHLORIDE 0.9% FLUSH
10.0000 mL | INTRAVENOUS | Status: DC | PRN
  Administered 2022-05-11: 10 mL

## 2022-05-11 MED ORDER — HEPARIN SOD (PORK) LOCK FLUSH 100 UNIT/ML IV SOLN
500.0000 [IU] | Freq: Once | INTRAVENOUS | Status: AC | PRN
  Administered 2022-05-11: 500 [IU]

## 2022-05-11 MED ORDER — SODIUM CHLORIDE 0.9 % IV SOLN
200.0000 mg | Freq: Once | INTRAVENOUS | Status: AC
  Administered 2022-05-11: 200 mg via INTRAVENOUS
  Filled 2022-05-11: qty 8

## 2022-05-11 MED ORDER — CAPECITABINE 500 MG PO TABS
1500.0000 mg | ORAL_TABLET | Freq: Two times a day (BID) | ORAL | 0 refills | Status: DC
  Filled 2022-05-11: qty 84, 21d supply, fill #0

## 2022-05-11 NOTE — Patient Instructions (Signed)
[facility]  Discharge Instructions: Thank you for choosing [facility] to provide your oncology and hematology care.  If you have a lab appointment with the Cancer Center, please come in thru the Main Entrance and check in at the main information desk.  Wear comfortable clothing and clothing appropriate for easy access to any Portacath or PICC line.   We strive to give you quality time with your provider. You may need to reschedule your appointment if you arrive late (15 or more minutes).  Arriving late affects you and other patients whose appointments are after yours.  Also, if you miss three or more appointments without notifying the office, you may be dismissed from the clinic at the provider's discretion.      For prescription refill requests, have your pharmacy contact our office and allow 72 hours for refills to be completed.    Today you received the following chemotherapy and/or immunotherapy agents Keytruda, return as scheduled.   To help prevent nausea and vomiting after your treatment, we encourage you to take your nausea medication as directed.  BELOW ARE SYMPTOMS THAT SHOULD BE REPORTED IMMEDIATELY: *FEVER GREATER THAN 100.4 F (38 C) OR HIGHER *CHILLS OR SWEATING *NAUSEA AND VOMITING THAT IS NOT CONTROLLED WITH YOUR NAUSEA MEDICATION *UNUSUAL SHORTNESS OF BREATH *UNUSUAL BRUISING OR BLEEDING *URINARY PROBLEMS (pain or burning when urinating, or frequent urination) *BOWEL PROBLEMS (unusual diarrhea, constipation, pain near the anus) TENDERNESS IN MOUTH AND THROAT WITH OR WITHOUT PRESENCE OF ULCERS (sore throat, sores in mouth, or a toothache) UNUSUAL RASH, SWELLING OR PAIN  UNUSUAL VAGINAL DISCHARGE OR ITCHING   Items with * indicate a potential emergency and should be followed up as soon as possible or go to the Emergency Department if any problems should occur.  Please show the CHEMOTHERAPY ALERT CARD or IMMUNOTHERAPY ALERT CARD at  check-in to the Emergency Department and triage nurse.  Should you have questions after your visit or need to cancel or reschedule your appointment, please contact [facility] [phone]  and follow the prompts.  Office hours are 8:00 a.m. to 4:30 p.m. Monday - Friday. Please note that voicemails left after 4:00 p.m. may not be returned until the following business day.  We are closed weekends and major holidays. You have access to a nurse at all times for urgent questions. Please call the main number to the clinic [phone] and follow the prompts.  For any non-urgent questions, you may also contact your provider using MyChart. We now offer e-Visits for anyone 18 and older to request care online for non-urgent symptoms. For details visit [email or web address].   Also download the MyChart app! Go to the app store, search "MyChart", open the app, select [facility], and log in with your MyChart username and password.  Masks are optional in the cancer centers. If you would like for your care team to wear a mask while they are taking care of you, please let them know. You may have one support person who is at least 73 years old accompany you for your appointments.  

## 2022-05-11 NOTE — Telephone Encounter (Signed)
Xeloda refill approved.  Tolerating and is to continue therapy.

## 2022-05-11 NOTE — Progress Notes (Signed)
Patient okay for treatment per Dr. Delton Coombes. Patient tolerated therapy with no complaints voiced. Side effects with management reviewed with understanding verbalized. Port site clean and dry with no bruising or swelling noted at site. Good blood return noted before and after administration of therapy. Band aid applied. Patient left in satisfactory condition with VSS and no s/s of distress noted.

## 2022-05-11 NOTE — Progress Notes (Signed)
Denise [name], [place] [address]   CLINIC:  Medical Oncology/Hematology  PCP:  Neale Burly, MD [place] / [place] [address] [phone]   REASON FOR VISIT:  Follow-up for T3N3c (stage IIIc) triple negative invasive lobular carcinoma of the left breast  PRIOR THERAPY: Left mastectomy and lymph node excision by Dr. Ninfa Linden on 11/20/2021  NGS Results: not done  CURRENT THERAPY: Pembrolizumab + Carboplatin D1,8,15+ Paclitaxel D1,8,15 q21d X 4 cycles / Pembrolizumab + AC q21d x 4 cycles  BRIEF ONCOLOGIC HISTORY:  Oncology History  Invasive lobular carcinoma of left breast in female [facility])  01/23/2021 Initial Diagnosis   Invasive lobular carcinoma of left breast in female [facility])   02/19/2021 Genetic Testing   Negative genetic testing on the CancerNext-Expanded+RNAinsight panel.  SMARCB1 VUS identified.  The CancerNext-Expanded gene panel offered by [facility] and includes sequencing and rearrangement analysis for the following 77 genes: AIP, ALK, APC*, ATM*, AXIN2, BAP1, BARD1, BLM, BMPR1A, BRCA1*, BRCA2*, BRIP1*, CDC73, CDH1*, CDK4, CDKN1B, CDKN2A, CHEK2*, CTNNA1, DICER1, FANCC, FH, FLCN, GALNT12, KIF1B, LZTR1, MAX, MEN1, MET, MLH1*, MSH2*, MSH3, MSH6*, MUTYH*, NBN, NF1*, NF2, NTHL1, PALB2*, PHOX2B, PMS2*, POT1, PRKAR1A, PTCH1, PTEN*, RAD51C*, RAD51D*, RB1, RECQL, RET, SDHA, SDHAF2, SDHB, SDHC, SDHD, SMAD4, SMARCA4, SMARCB1, SMARCE1, STK11, SUFU, TMEM127, TP53*, TSC1, TSC2, VHL and XRCC2 (sequencing and deletion/duplication); EGFR, EGLN1, HOXB13, KIT, MITF, PDGFRA, POLD1, and POLE (sequencing only); EPCAM and GREM1 (deletion/duplication only). DNA and RNA analyses performed for * genes. The report date is February 19, 2021.   02/27/2021 -  Chemotherapy   Patient is on Treatment Plan : BREAST Pembrolizumab + Carboplatin D1,8,15+ Paclitaxel D1,8,15 q21d X 4 cycles / Pembrolizumab + AC q21d x 4 cycles     12/22/2021 Cancer Staging   Staging form:  Breast, AJCC 8th Edition - Pathologic stage from 12/22/2021: No Stage Recommended (ypT3, pN3a, cM0, G2, ER-, PR-, HER2-) - Signed by Derek Jack, MD on 12/22/2021 Histopathologic type: Lobular carcinoma, NOS Stage prefix: Post-therapy Method of lymph node assessment: Axillary lymph node dissection Multigene prognostic tests performed: None Histologic grading system: 3 grade system     CANCER STAGING:  Cancer Staging  Invasive lobular carcinoma of left breast in female [facility]) Staging form: Breast, AJCC 8th Edition - Clinical stage from 01/23/2021: cT3, cN3c, G2, ER-, PR-, HER2- - Unsigned - Pathologic stage from 12/22/2021: No Stage Recommended (ypT3, pN3a, cM0, G2, ER-, PR-, HER2-) - Signed by Derek Jack, MD on 12/22/2021   INTERVAL HISTORY:  Denise [name], a 70 y.o. female, seen for follow-up of her breast cancer.  She has completed Xeloda 3 tablets twice daily 2 weeks on/1 week off last week.  She reported some diarrhea towards the tail end of Xeloda.  She also had some peeling of the skin of the feet with pain on walking.  Top of the feet also hurt.  She reported left hip pain which started about 6 weeks ago.  Dr. Sherrie Sport has done left hip x-ray on 04/22/2022.  REVIEW OF SYSTEMS:  Review of Systems  Constitutional:  Negative for appetite change and fatigue.  HENT:   Negative for mouth sores.   Respiratory:  Negative for cough and shortness of breath.   Gastrointestinal:  Negative for diarrhea, nausea and vomiting.  Musculoskeletal:  Positive for arthralgias (Left hip pain).  Skin:  Negative for rash.       Peeling - feet  Neurological:  Positive for headaches and numbness (toes). Negative  for dizziness.  Psychiatric/Behavioral:  Positive for sleep disturbance.   All other systems reviewed and are negative.   PAST MEDICAL/SURGICAL HISTORY:  Past Medical History:  Diagnosis Date   Asthma    as child   Cancer ([place]) 12/2020   left breast IMC   Complication  of anesthesia    pateitn states,' I coded when I had my D&Cmany years ago.   Dyspnea    Family history of breast cancer    Hypertension    Hypothyroidism    PONV (postoperative nausea and vomiting)    Port-A-Cath in place 02/26/2021   Past Surgical History:  Procedure Laterality Date   ABDOMINAL HYSTERECTOMY     BIOPSY  01/17/2018   Procedure: BIOPSY;  Surgeon: Rogene Houston, MD;  [facility];  Service: Endoscopy;;  duodenum,gastric   CHOLECYSTECTOMY     COLONOSCOPY WITH PROPOFOL N/A 01/17/2018   Procedure: COLONOSCOPY WITH PROPOFOL;  Surgeon: Rogene Houston, MD;  [facility];  Service: Endoscopy;  Laterality: N/A;  7:30   DILATION AND CURETTAGE OF UTERUS     ESOPHAGOGASTRODUODENOSCOPY (EGD) WITH PROPOFOL N/A 01/17/2018   Procedure: ESOPHAGOGASTRODUODENOSCOPY (EGD) WITH PROPOFOL;  Surgeon: Rogene Houston, MD;  [facility];  Service: Endoscopy;  Laterality: N/A;   POLYPECTOMY  01/17/2018   Procedure: POLYPECTOMY;  Surgeon: Rogene Houston, MD;  [facility];  Service: Endoscopy;;  transverse colon, cecal   PORTACATH PLACEMENT Right 02/17/2021   Procedure: INSERTION PORT-A-CATH;  Surgeon: Coralie Keens, MD;  Location: [place];  Service: General;  Laterality: Right;   RADIOACTIVE SEED GUIDED AXILLARY SENTINEL LYMPH NODE Left 11/20/2021   Procedure: RADIOACTIVE SEED GUIDED LEFT AXILLARY SENTINEL LYMPH NODE DISSECTION;  Surgeon: Coralie Keens, MD;  Location: [place];  Service: General;  Laterality: Left;   TOTAL MASTECTOMY Left 11/20/2021   Procedure: LEFT TOTAL MASTECTOMY;  Surgeon: Coralie Keens, MD;  Location: [place];  Service: General;  Laterality: Left;    SOCIAL HISTORY:  Social History   Socioeconomic History   Marital status: Widowed    Spouse name: Not on file   Number of children: 3   Years of education: Not on file   Highest education level: Not on file   Occupational History   Occupation: [employer]   Occupation: retired  Tobacco Use   Smoking status: Never   Smokeless tobacco: Never  Vaping Use   Vaping Use: Never used  Substance and Sexual Activity   Alcohol use: Never   Drug use: Never   Sexual activity: Not Currently    Birth control/protection: Surgical  Other Topics Concern   Not on file  Social History Narrative   Not on file   Social Determinants of Health   Financial Resource Strain: [place]  (01/22/2021)   Overall Financial Resource Strain (CARDIA)    Difficulty of Paying Living Expenses: Not hard at all  Food Insecurity: No [place] (01/22/2021)   Hunger Vital Sign    Worried About Running Out of Food in the Last Year: Never true    [place] in the Last Year: Never true  Transportation Needs: No Transportation Needs (01/22/2021)   PRAPARE - Hydrologist (Medical): No    Lack of Transportation (Non-Medical): No  Physical Activity: Insufficiently Active (01/22/2021)   Exercise Vital Sign    Days of Exercise per Week: 3 days    Minutes  of Exercise per Session: 30 min  Stress: No Stress Concern Present (01/22/2021)   [place]    Feeling of Stress : Not at all  Social Connections: Moderately Integrated (01/22/2021)   Social Connection and Isolation Panel [NHANES]    Frequency of Communication with Friends and Family: More than three times a week    Frequency of Social Gatherings with Friends and Family: More than three times a week    Attends Religious Services: More than 4 times per year    Active Member of [employer] or Organizations: No    Attends Music therapist: More than 4 times per year    Marital Status: Widowed  Intimate Partner Violence: Not At Risk (01/22/2021)   Humiliation, Afraid, Rape, and Kick questionnaire    Fear of Current or Ex-Partner: No    Emotionally Abused:  No    Physically Abused: No    Sexually Abused: No    FAMILY HISTORY:  Family History  Problem Relation Age of Onset   Breast cancer Mother        dx in her 65s   Heart disease Mother    Thyroid disease Mother    Lung cancer Father        dx in his 38s   Thyroid disease Maternal Aunt    Thyroid nodules Maternal Grandmother 35       goiter   Thyroid disease Maternal Grandmother    Heart disease Maternal Grandfather    Heart disease Paternal Grandmother    Heart disease Paternal Grandfather    Thyroid disease Daughter    Thyroid disease Daughter    Cancer Maternal Uncle        NOS   Cancer Paternal Uncle        NOS   Breast cancer Cousin        pat first cousin died in her 75s;     CURRENT MEDICATIONS:  Current Outpatient Medications  Medication Sig Dispense Refill   Acetaminophen (TYLENOL PO) Take 1 tablet by mouth as needed.     albuterol (VENTOLIN HFA) 108 (90 Base) MCG/ACT inhaler Inhale into the lungs.     benazepril (LOTENSIN) 20 MG tablet Take by mouth.     capecitabine (XELODA) 500 MG tablet Take 3 tablets (1,500 mg total) by mouth 2 (two) times daily after a meal. Take for 14 days, then hold for 7 days. Repeat every 21 days. 84 tablet 0   clobetasol cream (TEMOVATE) 9.41 % Apply 1 Application topically 2 (two) times daily. Apply to hands and feet 30 g 3   filgrastim (NEUPOGEN) 480 MCG/0.8ML SOSY injection Inject 0.8 mLs (480 mcg total) into the skin daily as needed for up to 8 doses (self-administer subcutaneously as directed by Dr. Delton Coombes as needed for neutropenia). 6.4 mL 0   gabapentin (NEURONTIN) 300 MG capsule Take 1 capsule by mouth at bedtime.     levothyroxine (SYNTHROID) 75 MCG tablet Take 100 mcg by mouth daily before breakfast.     lidocaine (XYLOCAINE) 5 % ointment Apply 1 Application topically 2 (two) times daily. Apply to hands and feet 35.44 g 2   lidocaine-prilocaine (EMLA) cream Apply a small amount to port a cath site and cover with plastic wrap  1 hour prior to infusion appointments 30 g 3   LORazepam (ATIVAN) 0.5 MG tablet Take 0.5 mg by mouth daily as needed.     losartan (COZAAR) 50 MG tablet Take 50 mg  by mouth daily.     magnesium oxide (MAG-OX) 400 MG tablet Take 1 tablet (400 mg total) by mouth 2 (two) times daily. 60 tablet 6   Melatonin 5 MG TABS Take 10 mg by mouth. As needed for sleep     nitrofurantoin, macrocrystal-monohydrate, (MACROBID) 100 MG capsule Take 100 mg by mouth 2 (two) times daily.     Omega-3 Fatty Acids (FISH OIL PO) Take by mouth.     Pembrolizumab (KEYTRUDA IV) Inject into the vein every 21 ( twenty-one) days.     prochlorperazine (COMPAZINE) 10 MG tablet Take by mouth.     Tiotropium Bromide-Olodaterol (STIOLTO RESPIMAT) 2.5-2.5 MCG/ACT AERS Inhale 1-2 puffs into the lungs as directed.     No current facility-administered medications for this visit.   Facility-Administered Medications Ordered in Other Visits  Medication Dose Route Frequency Provider Last Rate Last Admin   sodium chloride flush (NS) 0.9 % injection 10 mL  10 mL Intracatheter PRN Derek Jack, MD   10 mL at 05/11/22 1203    ALLERGIES:  Allergies  Allergen Reactions   Exforge [Amlodipine Besylate-Valsartan] Nausea And Vomiting   Tape Other (See Comments)    Redness and Irriatation   Cheese     Hard cheese   Levofloxacin In D5w Hives   Strawberry Extract Diarrhea    Seeds,nuts, lettuce, grapes   Tramadol Hcl Nausea And Vomiting   Yeast-Related Products Hives    Mold on bread    PHYSICAL EXAM:  Performance status (ECOG): 1 - Symptomatic but completely ambulatory  There were no vitals filed for this visit.  Wt Readings from Last 3 Encounters:  05/11/22 183 lb 6.4 oz (83.2 kg)  04/20/22 185 lb (83.9 kg)  03/26/22 183 lb 9.6 oz (83.3 kg)   Physical Exam Vitals reviewed.  Constitutional:      Appearance: Normal appearance.  Cardiovascular:     Rate and Rhythm: Normal rate and regular rhythm.     Pulses: Normal  pulses.     Heart sounds: Normal heart sounds.  Pulmonary:     Effort: Pulmonary effort is normal.     Breath sounds: Normal breath sounds.  Skin:    Comments: Skin peeling on L great toe  Neurological:     General: No focal deficit present.     Mental Status: She is alert and oriented to person, place, and time.  Psychiatric:        Mood and Affect: Mood normal.        Behavior: Behavior normal.     LABORATORY DATA:  I have reviewed the labs as listed.     Latest Ref Rng & Units 05/11/2022    9:00 AM 04/20/2022    9:46 AM 03/26/2022    8:36 AM  CBC  WBC 4.0 - 10.5 K/uL 3.2  2.8  3.4   Hemoglobin 12.0 - 15.0 g/dL 11.2  10.8  11.1   Hematocrit 36.0 - 46.0 % 32.4  31.7  32.3   Platelets 150 - 400 K/uL 195  171  166       Latest Ref Rng & Units 05/11/2022    9:00 AM 04/20/2022    9:46 AM 03/26/2022    8:36 AM  CMP  Glucose 70 - 99 mg/dL 97  98  98   BUN 8 - 23 mg/dL '22  18  20   ' Creatinine 0.44 - 1.00 mg/dL 0.80  0.74  0.79   Sodium 135 - 145 mmol/L 137  139  135   Potassium 3.5 - 5.1 mmol/L 4.1  3.8  3.7   Chloride 98 - 111 mmol/L 108  108  105   CO2 22 - 32 mmol/L '23  26  25   ' Calcium 8.9 - 10.3 mg/dL 8.9  9.0  8.7   Total Protein 6.5 - 8.1 g/dL 7.4  7.0  6.9   Total Bilirubin 0.3 - 1.2 mg/dL 0.5  0.7  0.7   Alkaline Phos 38 - 126 U/L 75  75  75   AST 15 - 41 U/L '19  18  23   ' ALT 0 - 44 U/L '13  14  15     ' DIAGNOSTIC IMAGING:  I have independently reviewed the scans and discussed with the patient. No results found.   ASSESSMENT:  1.  T3N3c (stage IIIc) triple negative invasive lobular carcinoma of the left breast: - She felt lump in her left breast for more than 6 months, but thought it was secondary to fibrocystic disease which she had all her life.  When she started having pain, she reached out to Dr. Sherrie Sport. - She previously had mammogram machine malfunction and had severe pain and traumatized by that experience.  She was having ultrasound of the breast every other  year since then. - She was on hormone replacement therapy for close to 10 years (from age [age]). - Ultrasound of the left breast on 01/08/2021 showed hyper vascular hypoechoic mass at 12 o'clock position measuring 3.8 x 1.3 x 2.5 cm.  There are calcifications located within the mass.  Mass extends to the level of the skin with mild associated skin thickening.  At least 3 morphologically abnormal lymph nodes in the left axilla. - Mammogram showed irregular spiculated mass with associated calcifications in the retroareolar to upper left breast measuring approximately 6 cm.  There are at least 4 morphologically abnormal lymph nodes identified in the left axilla. - Ultrasound-guided left breast and left axillary lymph node biopsy on 01/15/2021 - Pathology consistent with invasive lobular carcinoma, E-cadherin negative.  ER/PR/HER2 negative.  HER2 2+ by IHC, negative by FISH.  Ki-67 is 5%.  Lymph node core biopsy was consistent with metastatic carcinoma.  Grade 2. - PET scan on 02/03/2021 showed involvement of left supraclavicular, subpectoral, axillary lymph nodes along with the breast mass.  10 mm left lung nodule which is hypometabolic.  Spinal cord lesion at T12 level with a strong uptake. - MRI of the lumbar spine with and without contrast on 02/20/2021 showed no mass or abnormal enhancement within the canal at the T12 level to correspond to the site of PET scan positive.  No marrow replacing bone lesion. - 2D echo on 02/21/2021 with EF 55-60%. - Weekly carboplatin and paclitaxel with every 3 weeks pembrolizumab ([id number]) started on 02/27/2021. - Cycle 1 of AC on 07/04/2021.  Admission to [facility] with neutropenic fever. - Cycle 2 AC with 50% dose reduction on 08/21/2021. - 4 cycles of AC completed on 10/01/2021. - PET scan on 10/16/2021: Reduction in metabolic activity in the size of the left axillary lymph node and no evidence of breast hypermetabolism on PET scan. - Left mastectomy and lymph node  excision by Dr. Ninfa Linden on 11/20/2021 - Pathology: 6.2 cm invasive lobular carcinoma, grade 2, margins negative.  19/21 lymph nodes involved with macrometastasis.  ypT3, YPN3A - Adjuvant pembrolizumab started on 01/01/2022. -CREATE-X capecitabine 1500 mg twice daily 2 weeks on/1 week off started on 01/01/2022.     2.  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  She is non-smoker. - Mother died of breast cancer.  Maternal grandmother died very young in her 34s, sister has fibrocystic disease.  Father died of lung cancer and was a smoker.   PLAN:  1.  T3N3c triple negative invasive lobular carcinoma of the left breast: - She has been off of Xeloda since last Wednesday.  She is due to start back on 05/13/2022.  We will hold Xeloda until next visit. - No immunotherapy related side effects. - Reviewed labs today which showed normal LFTs and grossly normal CBC.  We will proceed with next cycle of Keytruda. - She reported left hip pain for the last 6 weeks.  An x-ray of the left hip from 04/22/2022 done at [facility] showed degenerative joint changes of inferior aspect of left SI joint. - She also reported severe headaches mostly right-sided in the last few months which is not improving. - Recommend MRI of the left hip with and without contrast and brain MRI with and without contrast given her high risk malignancy.  She has an appointment with orthopedics on 05/13/2022 for the same left hip pain.  2.  Hypothyroidism: - Current new Synthroid 75 mcg daily.  TSH is 4.2.  3.  Peripheral neuropathy: - This in the toes has been stable.  4.  Hypomagnesemia: - Magnesium twice daily.  Magnesium is normal.  5.  Grade 2 HFSR of the feet: - After last cycle, she still has peeling of the skin of the soles.  Palms look better.  She also has some pain in the soles when walking. - I have recommended that she hold off on restarting her next cycle.  We will reevaluate and  after the soles completely healed, will consider dose reduction of Xeloda to 2 tablets twice daily, 2 weeks on/1 week off.   Orders placed this encounter:  Orders Placed This Encounter  Procedures   MR HIP LEFT W WO CONTRAST   MR Brain W Wo Contrast      Derek Jack, MD [place] [phone]   I, Thana Ates, am acting as a scribe for Dr. Derek Jack.  I, Derek Jack MD, have reviewed the above documentation for accuracy and completeness, and I agree with the above.

## 2022-05-11 NOTE — Patient Instructions (Addendum)
[place] at [facility] Discharge Instructions   You were seen and examined today by Dr. Delton Coombes.  He reviewed the results of your lab work which are normal/stable.   We will proceed with your Keytruda infusion today.  Hold Xeloda until Dr. Raliegh Ip tells you to restart. We will restart at a reduced-dose when the time comes.    We will schedule you to have an MRI of your left hip to investigate this pain further.  Return as scheduled in 3 weeks.    Thank you for choosing [place] at [facility] to provide your oncology and hematology care.  To afford each patient quality time with our provider, please arrive at least 15 minutes before your scheduled appointment time.   If you have a lab appointment with the [facility] please come in thru the Main Entrance and check in at the main information desk.  You need to re-schedule your appointment should you arrive 10 or more minutes late.  We strive to give you quality time with our providers, and arriving late affects you and other patients whose appointments are after yours.  Also, if you no show three or more times for appointments you may be dismissed from the clinic at the providers discretion.     Again, thank you for choosing [facility].  Our hope is that these requests will decrease the amount of time that you wait before being seen by our physicians.       _____________________________________________________________  Should you have questions after your visit to [facility], please contact our office at [phone] and follow the prompts.  Our office hours are 8:00 a.m. and 4:30 p.m. Monday - Friday.  Please note that voicemails left after 4:00 p.m. may not be returned until the following business day.  We are closed weekends and major holidays.  You do have access to a nurse 24-7, just call the main number to the clinic [phone] and do not press any  options, hold on the line and a nurse will answer the phone.    For prescription refill requests, have your pharmacy contact our office and allow 72 hours.    Due to Covid, you will need to wear a mask upon entering the hospital. If you do not have a mask, a mask will be given to you at the Main Entrance upon arrival. For doctor visits, patients may have 1 support person age 46 or older with them. For treatment visits, patients can not have anyone with them due to social distancing guidelines and our immunocompromised population.

## 2022-05-12 ENCOUNTER — Other Ambulatory Visit: Payer: Self-pay

## 2022-05-13 ENCOUNTER — Encounter: Payer: Self-pay | Admitting: Orthopaedic Surgery

## 2022-05-13 ENCOUNTER — Other Ambulatory Visit: Payer: Self-pay | Admitting: *Deleted

## 2022-05-13 ENCOUNTER — Encounter: Payer: Self-pay | Admitting: *Deleted

## 2022-05-13 ENCOUNTER — Other Ambulatory Visit: Payer: Self-pay

## 2022-05-13 ENCOUNTER — Ambulatory Visit (INDEPENDENT_AMBULATORY_CARE_PROVIDER_SITE_OTHER): Payer: Medicare Other | Admitting: Orthopaedic Surgery

## 2022-05-13 VITALS — BP 153/83 | HR 68 | Ht 65.0 in | Wt 185.0 lb

## 2022-05-13 DIAGNOSIS — M545 Low back pain, unspecified: Secondary | ICD-10-CM

## 2022-05-13 DIAGNOSIS — M25552 Pain in left hip: Secondary | ICD-10-CM

## 2022-05-13 DIAGNOSIS — C50912 Malignant neoplasm of unspecified site of left female breast: Secondary | ICD-10-CM

## 2022-05-13 DIAGNOSIS — M5440 Lumbago with sciatica, unspecified side: Secondary | ICD-10-CM

## 2022-05-13 DIAGNOSIS — G8929 Other chronic pain: Secondary | ICD-10-CM

## 2022-05-13 NOTE — Progress Notes (Signed)
Patient not really evaluated as she is to have MRI of the left hip next week.  I did add MRI of the lumbar spine as she has left sided paresthesias.  She is actively being treated for cancer by cancer clinic.  I will have her return in two weeks for her office visit.  I will have the MRIs done then and can evaluate her better.  She is in agreement with this plan.  Encounter Diagnoses  Name Primary?   Chronic left hip pain Yes   Lumbar pain    Invasive lobular carcinoma of left breast in female [facility])

## 2022-05-13 NOTE — Patient Instructions (Addendum)
NO CHARGE VIIST  MRI SCHEDULED FOR 9/6.   SCHEDULE FOR 05/26/22

## 2022-05-13 NOTE — Progress Notes (Signed)
MRI of LS added to previously scheduled MRI of brain and hip per Dr. Brooke Bonito request.  Patient is aware.

## 2022-05-16 ENCOUNTER — Other Ambulatory Visit: Payer: Self-pay | Admitting: Hematology

## 2022-05-19 ENCOUNTER — Other Ambulatory Visit: Payer: Self-pay

## 2022-05-20 ENCOUNTER — Telehealth: Payer: Self-pay | Admitting: *Deleted

## 2022-05-20 ENCOUNTER — Ambulatory Visit (HOSPITAL_COMMUNITY): Payer: Medicare Other

## 2022-05-20 NOTE — Patient Outreach (Signed)
  Care Coordination   05/20/2022 Name: Denise [name] MRN: [id number] DOB: 11/24/1950   Care Coordination Outreach Attempts:  An unsuccessful telephone outreach was attempted today to offer the patient information about available care coordination services as a benefit of their health plan.   Follow Up Plan:  Additional outreach attempts will be made to offer the patient care coordination information and services.   Encounter Outcome:  No Answer  Care Coordination Interventions Activated:  No   Care Coordination Interventions:  No, not indicated    Emelia Loron RN, BSN [place] [phone] [email or web address]

## 2022-05-23 ENCOUNTER — Ambulatory Visit (HOSPITAL_COMMUNITY)
Admission: RE | Admit: 2022-05-23 | Discharge: 2022-05-23 | Disposition: A | Discharge status: Home / Self Care | Payer: Medicare Other | Source: Ambulatory Visit | Attending: Hematology | Admitting: Hematology

## 2022-05-23 ENCOUNTER — Ambulatory Visit (HOSPITAL_COMMUNITY)
Admission: RE | Admit: 2022-05-23 | Discharge: 2022-05-23 | Disposition: A | Discharge status: Home / Self Care | Payer: Medicare Other | Source: Ambulatory Visit | Attending: Orthopaedic Surgery | Admitting: Orthopaedic Surgery

## 2022-05-23 DIAGNOSIS — M25552 Pain in left hip: Secondary | ICD-10-CM | POA: Diagnosis not present

## 2022-05-23 DIAGNOSIS — R519 Headache, unspecified: Secondary | ICD-10-CM | POA: Insufficient documentation

## 2022-05-23 DIAGNOSIS — M5440 Lumbago with sciatica, unspecified side: Secondary | ICD-10-CM

## 2022-05-23 DIAGNOSIS — C50912 Malignant neoplasm of unspecified site of left female breast: Secondary | ICD-10-CM

## 2022-05-23 DIAGNOSIS — M5126 Other intervertebral disc displacement, lumbar region: Secondary | ICD-10-CM | POA: Diagnosis not present

## 2022-05-23 DIAGNOSIS — Z853 Personal history of malignant neoplasm of breast: Secondary | ICD-10-CM | POA: Diagnosis not present

## 2022-05-23 DIAGNOSIS — C7951 Secondary malignant neoplasm of bone: Secondary | ICD-10-CM | POA: Diagnosis not present

## 2022-05-23 DIAGNOSIS — G9389 Other specified disorders of brain: Secondary | ICD-10-CM | POA: Diagnosis not present

## 2022-05-23 MED ORDER — GADOBUTROL 1 MMOL/ML IV SOLN
8.0000 mL | Freq: Once | INTRAVENOUS | Status: AC | PRN
Start: 2022-05-23 — End: 2022-05-23
  Administered 2022-05-23: 8 mL via INTRAVENOUS

## 2022-05-26 ENCOUNTER — Encounter: Payer: Self-pay | Admitting: Nurse Practitioner

## 2022-05-26 ENCOUNTER — Other Ambulatory Visit: Payer: Self-pay | Admitting: Radiation Therapy

## 2022-05-26 ENCOUNTER — Ambulatory Visit (INDEPENDENT_AMBULATORY_CARE_PROVIDER_SITE_OTHER): Payer: Medicare Other | Admitting: Orthopaedic Surgery

## 2022-05-26 ENCOUNTER — Encounter: Payer: Self-pay | Admitting: Orthopaedic Surgery

## 2022-05-26 ENCOUNTER — Other Ambulatory Visit (HOSPITAL_COMMUNITY): Payer: Self-pay

## 2022-05-26 ENCOUNTER — Inpatient Hospital Stay: Payer: Medicare Other | Attending: Hematology | Admitting: Nurse Practitioner

## 2022-05-26 VITALS — BP 130/70 | HR 78 | Temp 98.7°F | Resp 18 | Wt 179.5 lb

## 2022-05-26 VITALS — Ht 65.0 in | Wt 179.0 lb

## 2022-05-26 DIAGNOSIS — C7931 Secondary malignant neoplasm of brain: Secondary | ICD-10-CM | POA: Diagnosis not present

## 2022-05-26 DIAGNOSIS — M48061 Spinal stenosis, lumbar region without neurogenic claudication: Secondary | ICD-10-CM | POA: Insufficient documentation

## 2022-05-26 DIAGNOSIS — C7951 Secondary malignant neoplasm of bone: Secondary | ICD-10-CM | POA: Diagnosis not present

## 2022-05-26 DIAGNOSIS — Z79899 Other long term (current) drug therapy: Secondary | ICD-10-CM | POA: Diagnosis not present

## 2022-05-26 DIAGNOSIS — Z801 Family history of malignant neoplasm of trachea, bronchus and lung: Secondary | ICD-10-CM | POA: Insufficient documentation

## 2022-05-26 DIAGNOSIS — Z79890 Hormone replacement therapy (postmenopausal): Secondary | ICD-10-CM | POA: Diagnosis not present

## 2022-05-26 DIAGNOSIS — Z803 Family history of malignant neoplasm of breast: Secondary | ICD-10-CM | POA: Insufficient documentation

## 2022-05-26 DIAGNOSIS — C50912 Malignant neoplasm of unspecified site of left female breast: Secondary | ICD-10-CM | POA: Insufficient documentation

## 2022-05-26 DIAGNOSIS — Z9221 Personal history of antineoplastic chemotherapy: Secondary | ICD-10-CM | POA: Diagnosis not present

## 2022-05-26 DIAGNOSIS — M47816 Spondylosis without myelopathy or radiculopathy, lumbar region: Secondary | ICD-10-CM | POA: Insufficient documentation

## 2022-05-26 DIAGNOSIS — I10 Essential (primary) hypertension: Secondary | ICD-10-CM | POA: Insufficient documentation

## 2022-05-26 DIAGNOSIS — E041 Nontoxic single thyroid nodule: Secondary | ICD-10-CM | POA: Insufficient documentation

## 2022-05-26 DIAGNOSIS — Z171 Estrogen receptor negative status [ER-]: Secondary | ICD-10-CM | POA: Diagnosis not present

## 2022-05-26 DIAGNOSIS — E039 Hypothyroidism, unspecified: Secondary | ICD-10-CM | POA: Insufficient documentation

## 2022-05-26 DIAGNOSIS — Z9012 Acquired absence of left breast and nipple: Secondary | ICD-10-CM | POA: Insufficient documentation

## 2022-05-26 DIAGNOSIS — G629 Polyneuropathy, unspecified: Secondary | ICD-10-CM | POA: Insufficient documentation

## 2022-05-26 DIAGNOSIS — C50919 Malignant neoplasm of unspecified site of unspecified female breast: Secondary | ICD-10-CM

## 2022-05-26 DIAGNOSIS — C799 Secondary malignant neoplasm of unspecified site: Secondary | ICD-10-CM | POA: Diagnosis not present

## 2022-05-26 NOTE — Progress Notes (Signed)
[place], [place] [address]   CLINIC:  Medical Oncology/Hematology  PCP:  Neale Burly, MD [place] / [place] [id number] [phone]   REASON FOR VISIT:  Follow-up for T3N3c (stage IIIc) triple negative invasive lobular carcinoma of the left breast  PRIOR THERAPY: Left mastectomy and lymph node excision by Dr. Ninfa Linden on 11/20/2021  NGS Results: not done  CURRENT THERAPY: Pembrolizumab + Carboplatin D1,8,15+ Paclitaxel D1,8,15 q21d X 4 cycles / Pembrolizumab + AC q21d x 4 cycles  BRIEF ONCOLOGIC HISTORY:  Oncology History  Invasive lobular carcinoma of left breast in female [facility])  01/23/2021 Initial Diagnosis   Invasive lobular carcinoma of left breast in female [facility])   02/19/2021 Genetic Testing   Negative genetic testing on the CancerNext-Expanded+RNAinsight panel.  SMARCB1 VUS identified.  The CancerNext-Expanded gene panel offered by [facility] and includes sequencing and rearrangement analysis for the following 77 genes: AIP, ALK, APC*, ATM*, AXIN2, BAP1, BARD1, BLM, BMPR1A, BRCA1*, BRCA2*, BRIP1*, CDC73, CDH1*, CDK4, CDKN1B, CDKN2A, CHEK2*, CTNNA1, DICER1, FANCC, FH, FLCN, GALNT12, KIF1B, LZTR1, MAX, MEN1, MET, MLH1*, MSH2*, MSH3, MSH6*, MUTYH*, NBN, NF1*, NF2, NTHL1, PALB2*, PHOX2B, PMS2*, POT1, PRKAR1A, PTCH1, PTEN*, RAD51C*, RAD51D*, RB1, RECQL, RET, SDHA, SDHAF2, SDHB, SDHC, SDHD, SMAD4, SMARCA4, SMARCB1, SMARCE1, STK11, SUFU, TMEM127, TP53*, TSC1, TSC2, VHL and XRCC2 (sequencing and deletion/duplication); EGFR, EGLN1, HOXB13, KIT, MITF, PDGFRA, POLD1, and POLE (sequencing only); EPCAM and GREM1 (deletion/duplication only). DNA and RNA analyses performed for * genes. The report date is February 19, 2021.   02/27/2021 -  Chemotherapy   Patient is on Treatment Plan : BREAST Pembrolizumab + Carboplatin D1,8,15+ Paclitaxel D1,8,15 q21d X 4 cycles / Pembrolizumab + AC q21d x 4 cycles     12/22/2021 Cancer Staging   Staging form:  Breast, AJCC 8th Edition - Pathologic stage from 12/22/2021: No Stage Recommended (ypT3, pN3a, cM0, G2, ER-, PR-, HER2-) - Signed by Derek Jack, MD on 12/22/2021 Histopathologic type: Lobular carcinoma, NOS Stage prefix: Post-therapy Method of lymph node assessment: Axillary lymph node dissection Multigene prognostic tests performed: None Histologic grading system: 3 grade system     CANCER STAGING:  Cancer Staging  Invasive lobular carcinoma of left breast in female [facility]) Staging form: Breast, AJCC 8th Edition - Clinical stage from 01/23/2021: cT3, cN3c, G2, ER-, PR-, HER2- - Unsigned - Pathologic stage from 12/22/2021: No Stage Recommended (ypT3, pN3a, cM0, G2, ER-, PR-, HER2-) - Signed by Derek Jack, MD on 12/22/2021   INTERVAL HISTORY:  Denise [name], a 71 y.o. female, seen for follow-up of her breast cancer.  She returns today for discussion of recent scan results showing new metastatic disease to bone and brain.  She saw the orthopedic doctor earlier today who reviewed with her the MRI of the spine that showed 15 mm lesion in the T11 vertebral body consistent with metastatic lesion, small lesion involving the left S1 pedicle area, multilevel disc disease with early spinal stenosis at L3-4 and L4-5.  At she also had MRI of the hip showing metastatic bone lesions involving the pelvis and left hip.  The hip lesion is partially destroying the anterior cortex and is responsible for her left hip pain and her limping.  A PET scan was ordered, she has been referred to see radiation oncology this coming Thursday.  PET scan and a more focused MRI of brain mets will be done on Friday.  MRI of the brain showed 2 enhancing mass lesions in  the right hemisphere both consistent with metastatic disease.  The larger mass is 9.4 mm in diameter, and the smaller mass is 5.5 mm in diameter.  The patient has had progressive unrelenting headaches and has also noticed a twitch in her right  eye along with inability at times to open her right eye fully.  She denies the need for any prescription pain medications.    Today we had a very frank and lengthy talk regarding her prognosis, goals of care, and treatment options.  She is very much aware that she has an aggressive and incurable form of cancer which has spread to her bone and brain.  She suspects that there is more extensive disease that has not yet been discovered.  She feels like her urine output has decreased overall and that her body is not functioning the way it has previously.  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  Her biggest fear is that she will have a stroke or will be rendered incapable of living alone.  Given her incurable status she is emphatic that she does not want to be resuscitated or intubated.  She does not wish to ever be placed in a residential hospice or nursing home.  She wants to die at home, preferably remaining independent until her death.  She is not against chemotherapy but does not want to have any chemotherapy which would increase her risk of a stroke.  IShe is hopeful that with radiation and some form of systemic therapy she can continue to prolong her life.  She has an excellent support system with a strong faith community, as well as 2 daughters who live nearby with her husband's and grandchildren.  She is tearful at times during today's visit.  Emotional support was offered.  I have advised her that until we know the full extent of the burden of disease we cannot give her an accurate prognosis.  We will obtain the the results of the pending PET scan, along with the more focused MRI of the brain and bring her back for discussion of treatment options.  We will cancel all further treatments with carbo/Taxol and pembrolizumab as she has had clear and significant progression.  At patient is in agreement with this planned and all of her questions were answered to her  satisfaction.  She states her goals of care are to remain independent, to have a high quality of life, to be able to think and express her concerns without hindrance, and to take as little pain medicine as possible.    We will certainly support her in these goals and will seek to make her as comfortable as possible.  I have reiterated to her that if she needs prescription medication for her pain at any time we will be happy to provide that.  At this point she states she has a high pain tolerance and would prefer to not take anything.  She will see radiation oncology later this week. We will make her a DNR per her wishes.  She states she has discussed this with her family.     REVIEW OF SYSTEMS:  Review of Systems  Constitutional:  Negative for appetite change and fatigue.  HENT:   Negative for mouth sores.        Reports twitching of right eye, and inability at times to fully open right eyelid  Respiratory:  Negative for cough and shortness of breath.   Gastrointestinal:  Negative  for diarrhea, nausea and vomiting.  Genitourinary:         States "I do not feel like my kidneys are fully functioning.  I am making less urine overall and I feel like my system is off"  Musculoskeletal:  Positive for arthralgias (Left hip pain).  Skin:  Negative for rash.  Neurological:  Positive for dizziness (along with headaches, transient vertigo) and headaches (progressive, daily, with transient "buzzing" noises). Numbness: toes. Psychiatric/Behavioral:  Positive for sleep disturbance. The patient is nervous/anxious (Concerned over possibility of cognitive dysfunction or stroke).   All other systems reviewed and are negative.   PAST MEDICAL/SURGICAL HISTORY:  Past Medical History:  Diagnosis Date   Asthma    as child   Cancer ([place]) 12/2020   left breast IMC   Complication of anesthesia    pateitn states,' I coded when I had my D&Cmany years ago.   Dyspnea    Family history of breast cancer     Hypertension    Hypothyroidism    PONV (postoperative nausea and vomiting)    Port-A-Cath in place 02/26/2021   Past Surgical History:  Procedure Laterality Date   ABDOMINAL HYSTERECTOMY     BIOPSY  01/17/2018   Procedure: BIOPSY;  Surgeon: Rogene Houston, MD;  [facility];  Service: Endoscopy;;  duodenum,gastric   CHOLECYSTECTOMY     COLONOSCOPY WITH PROPOFOL N/A 01/17/2018   Procedure: COLONOSCOPY WITH PROPOFOL;  Surgeon: Rogene Houston, MD;  [facility];  Service: Endoscopy;  Laterality: N/A;  7:30   DILATION AND CURETTAGE OF UTERUS     ESOPHAGOGASTRODUODENOSCOPY (EGD) WITH PROPOFOL N/A 01/17/2018   Procedure: ESOPHAGOGASTRODUODENOSCOPY (EGD) WITH PROPOFOL;  Surgeon: Rogene Houston, MD;  [facility];  Service: Endoscopy;  Laterality: N/A;   POLYPECTOMY  01/17/2018   Procedure: POLYPECTOMY;  Surgeon: Rogene Houston, MD;  [facility];  Service: Endoscopy;;  transverse colon, cecal   PORTACATH PLACEMENT Right 02/17/2021   Procedure: INSERTION PORT-A-CATH;  Surgeon: Coralie Keens, MD;  Location: [facility];  Service: General;  Laterality: Right;   RADIOACTIVE SEED GUIDED AXILLARY SENTINEL LYMPH NODE Left 11/20/2021   Procedure: RADIOACTIVE SEED GUIDED LEFT AXILLARY SENTINEL LYMPH NODE DISSECTION;  Surgeon: Coralie Keens, MD;  Location: [facility];  Service: General;  Laterality: Left;   TOTAL MASTECTOMY Left 11/20/2021   Procedure: LEFT TOTAL MASTECTOMY;  Surgeon: Coralie Keens, MD;  Location: [facility];  Service: General;  Laterality: Left;    SOCIAL HISTORY:  Social History   Socioeconomic History   Marital status: Widowed    Spouse name: Not on file   Number of children: 3   Years of education: Not on file   Highest education level: Not on file  Occupational History   Occupation: [employer]   Occupation: retired  Tobacco Use   Smoking status: Never   Smokeless  tobacco: Never  Vaping Use   Vaping Use: Never used  Substance and Sexual Activity   Alcohol use: Never   Drug use: Never   Sexual activity: Not Currently    Birth control/protection: Surgical  Other Topics Concern   Not on file  Social History Narrative   Not on file   Social Determinants of Health   Financial Resource Strain: [place]  (01/22/2021)   Overall Financial Resource Strain (CARDIA)    Difficulty of Paying Living Expenses: Not hard at all  Food Insecurity: No [place] (01/22/2021)  Hunger Vital Sign    Worried About Running Out of Food in the Last Year: Never true    Ran Out of Food in the Last Year: Never true  Transportation Needs: No Transportation Needs (01/22/2021)   PRAPARE - Hydrologist (Medical): No    Lack of Transportation (Non-Medical): No  Physical Activity: Insufficiently Active (01/22/2021)   Exercise Vital Sign    Days of Exercise per Week: 3 days    Minutes of Exercise per Session: 30 min  Stress: No Stress Concern Present (01/22/2021)   [place]    Feeling of Stress : Not at all  Social Connections: Moderately Integrated (01/22/2021)   Social Connection and Isolation Panel [NHANES]    Frequency of Communication with Friends and Family: More than three times a week    Frequency of Social Gatherings with Friends and Family: More than three times a week    Attends Religious Services: More than 4 times per year    Active Member of [employer] or Organizations: No    Attends Music therapist: More than 4 times per year    Marital Status: Widowed  Intimate Partner Violence: Not At Risk (01/22/2021)   Humiliation, Afraid, Rape, and Kick questionnaire    Fear of Current or Ex-Partner: No    Emotionally Abused: No    Physically Abused: No    Sexually Abused: No    FAMILY HISTORY:  Family History  Problem Relation Age of Onset   Breast cancer  Mother        dx in her 30s   Heart disease Mother    Thyroid disease Mother    Lung cancer Father        dx in his 48s   Thyroid disease Maternal Aunt    Thyroid nodules Maternal Grandmother 35       goiter   Thyroid disease Maternal Grandmother    Heart disease Maternal Grandfather    Heart disease Paternal Grandmother    Heart disease Paternal Grandfather    Thyroid disease Daughter    Thyroid disease Daughter    Cancer Maternal Uncle        NOS   Cancer Paternal Uncle        NOS   Breast cancer Cousin        pat first cousin died in her 71s;     CURRENT MEDICATIONS:  Current Outpatient Medications  Medication Sig Dispense Refill   Acetaminophen (TYLENOL PO) Take 1 tablet by mouth as needed.     albuterol (VENTOLIN HFA) 108 (90 Base) MCG/ACT inhaler Inhale into the lungs.     benazepril (LOTENSIN) 20 MG tablet Take by mouth.     clobetasol cream (TEMOVATE) 1.60 % Apply 1 Application topically 2 (two) times daily. Apply to hands and feet 30 g 3   gabapentin (NEURONTIN) 300 MG capsule Take 1 capsule by mouth at bedtime.     levothyroxine (SYNTHROID) 75 MCG tablet Take 100 mcg by mouth daily before breakfast.     lidocaine (XYLOCAINE) 5 % ointment Apply 1 Application topically 2 (two) times daily. Apply to hands and feet 35.44 g 2   lidocaine-prilocaine (EMLA) cream Apply a small amount to port a cath site and cover with plastic wrap 1 hour prior to infusion appointments 30 g 3   LORazepam (ATIVAN) 0.5 MG tablet Take 0.5 mg by mouth daily as needed.     losartan (COZAAR)  50 MG tablet Take 50 mg by mouth daily.     magnesium oxide (MAG-OX) 400 MG tablet Take 1 tablet (400 mg total) by mouth 2 (two) times daily. 60 tablet 6   Melatonin 5 MG TABS Take 10 mg by mouth. As needed for sleep     Omega-3 Fatty Acids (FISH OIL PO) Take by mouth.     Pembrolizumab (KEYTRUDA IV) Inject into the vein every 21 ( twenty-one) days.     prochlorperazine (COMPAZINE) 10 MG tablet Take by mouth.      Tiotropium Bromide-Olodaterol (STIOLTO RESPIMAT) 2.5-2.5 MCG/ACT AERS Inhale 1-2 puffs into the lungs as directed.     No current facility-administered medications for this visit.    ALLERGIES:  Allergies  Allergen Reactions   Exforge [Amlodipine Besylate-Valsartan] Nausea And Vomiting   Tape Other (See Comments)    Redness and Irriatation   Cheese     Hard cheese   Levofloxacin In D5w Hives   Strawberry Extract Diarrhea    Seeds,nuts, lettuce, grapes   Tramadol Hcl Nausea And Vomiting   Yeast-Related Products Hives    Mold on bread    PHYSICAL EXAM:  Performance status (ECOG): 1 - Symptomatic but completely ambulatory  Blood pressure 137/80, pulse 77, respirations 18, temp 98.7, oxygen saturation 99% on room air, weight 179.5 pounds, pain score 6/10 to left hip, low back Rates energy level at 50% and appetite at 100%  Wt Readings from Last 3 Encounters:  05/26/22 179 lb (81.2 kg)  05/13/22 185 lb (83.9 kg)  05/11/22 183 lb 6.4 oz (83.2 kg)   Physical Exam Vitals reviewed.  Constitutional:      Appearance: Normal appearance.  HENT:     Head: Normocephalic and atraumatic.  Eyes:     Extraocular Movements: Extraocular movements intact.  Cardiovascular:     Rate and Rhythm: Normal rate and regular rhythm.     Pulses: Normal pulses.     Heart sounds: Normal heart sounds.  Pulmonary:     Effort: Pulmonary effort is normal.     Breath sounds: Normal breath sounds.  Abdominal:     Palpations: Abdomen is soft.  Skin:    General: Skin is warm and dry.  Neurological:     General: No focal deficit present.     Mental Status: She is alert and oriented to person, place, and time.  Psychiatric:        Behavior: Behavior normal.     Comments: Tearful at times     LABORATORY DATA:  I have reviewed the labs as listed.     Latest Ref Rng & Units 05/11/2022    9:00 AM 04/20/2022    9:46 AM 03/26/2022    8:36 AM  CBC  WBC 4.0 - 10.5 K/uL 3.2  2.8  3.4   Hemoglobin  12.0 - 15.0 g/dL 11.2  10.8  11.1   Hematocrit 36.0 - 46.0 % 32.4  31.7  32.3   Platelets 150 - 400 K/uL 195  171  166       Latest Ref Rng & Units 05/11/2022    9:00 AM 04/20/2022    9:46 AM 03/26/2022    8:36 AM  CMP  Glucose 70 - 99 mg/dL 97  98  98   BUN 8 - 23 mg/dL _0 Creatinine 0.44 - 1.00 mg/dL 0.80  0.74  0.79   Sodium 135 - 145 mmol/L 137  139  135   Potassium 3.5 -  5.1 mmol/L 4.1  3.8  3.7   Chloride 98 - 111 mmol/L 108  108  105   CO2 22 - 32 mmol/L _0 Calcium 8.9 - 10.3 mg/dL 8.9  9.0  8.7   Total Protein 6.5 - 8.1 g/dL 7.4  7.0  6.9   Total Bilirubin 0.3 - 1.2 mg/dL 0.5  0.7  0.7   Alkaline Phos 38 - 126 U/L 75  75  75   AST 15 - 41 U/L _1 ALT 0 - 44 U/L _2 DIAGNOSTIC IMAGING:  I have independently reviewed the scans and discussed with the patient. MR HIP LEFT W WO CONTRAST  Result Date: 05/25/2022 CLINICAL DATA:  Left hip pain. History of breast cancer. EXAM: MRI OF THE LEFT HIP WITHOUT AND WITH CONTRAST TECHNIQUE: Multiplanar, multisequence MR imaging was performed both before and after administration of intravenous contrast. CONTRAST:  99m GADAVIST GADOBUTROL 1 MMOL/ML IV SOLN COMPARISON:  PET-CT 10/16/2021 FINDINGS: There is a metastatic lesion involving the left hip. This is in the intertrochanteric region medially and involves the cortex which is markedly thinned. No involvement of the lesser trochanter. No acute pathologic fracture. There is a second small lesion involving the left ischium. This is also a cortical lesion but no pathologic fracture. Small subcortical lesion involving the greater trochanter but no fracture. Bilateral symmetric sacral lesions near the SI joints could be degenerative changes. No muscle lesions or subcutaneous lesions. No significant intrapelvic abnormalities. Diffuse sigmoid colon diverticulosis is noted. IMPRESSION: 1. Metastatic bone lesions involving the pelvis and left hip as detailed above. The  hip lesion is partially destroying the anterior cortex and is likely causing the patient's symptoms. No discrete pathologic fracture but certainly at risk. 2. No muscle or subcutaneous or intrapelvic metastatic lesions. Electronically Signed   By: PMarijo SanesM.D.   On: 05/25/2022 14:39   MR Brain W Wo Contrast  Result Date: 05/25/2022 CLINICAL DATA:  Headache, new or worsening. Severe frontal headache. History of breast cancer. EXAM: MRI HEAD WITHOUT AND WITH CONTRAST TECHNIQUE: Multiplanar, multiecho pulse sequences of the brain and surrounding structures were obtained without and with intravenous contrast. CONTRAST:  83mGADAVIST GADOBUTROL 1 MMOL/ML IV SOLN COMPARISON:  CT 01/29/2022 FINDINGS: Brain: Diffusion imaging does not show any acute or subacute infarction. There is a background pattern brain volume loss with chronic small-vessel ischemic changes of a moderate degree affecting the cerebral hemispheric white matter. There are 2 enhancing mass lesions in the right hemisphere within what I think is probably the precentral gyrus. These are consistent with metastatic masses. The larger mass measures 9.4 mm in diameter and the smaller mass measures 5.5 mm in diameter. No evidence of hemorrhage or mass effect. No hydrocephalus or extra-axial collection. Vascular: Major vessels at the base of the brain show flow. Skull and upper cervical spine: No evidence of calvarial or skull base metastatic disease. Sinuses/Orbits: Clear/normal Other: None IMPRESSION: Two mass lesions at the right frontoparietal junction region, favored to be within the precentral gyrus, consistent with metastatic disease. The larger measures 9.4 mm in diameter and the smaller measures 5.5 mm in diameter. No significant mass effect or hemorrhage. Moderate chronic small-vessel ischemic changes of the cerebral hemispheric white matter. Electronically Signed   By: MaNelson Chimes.D.   On: 05/25/2022 14:31   MR Lumbar Spine W Wo  Contrast  Result Date: 05/25/2022 CLINICAL DATA:  Low back pain. History of breast cancer. EXAM: MRI LUMBAR SPINE WITHOUT AND WITH CONTRAST TECHNIQUE: Multiplanar and multiecho pulse sequences of the lumbar spine were obtained without and with intravenous contrast. CONTRAST:  37m GADAVIST GADOBUTROL 1 MMOL/ML IV SOLN COMPARISON:  PET-CT 10/17/2021 and prior MRI lumbar spine 02/20/2021 FINDINGS: Segmentation: There are five lumbar type vertebral bodies. The last full intervertebral disc space is labeled L5-S1. Alignment:  Normal Vertebrae: There is a 15 mm lesion in the T11 vertebral body posteriorly which demonstrates low T1 and high T2 signal intensity and subsequent contrast enhancement and is consistent with a metastatic lesion. No canal involvement. Small lesion also involving the left S1 pedicle area. No canal stenosis. Benign T12 hemangioma and small Schmorl's nodes. Conus medullaris and cauda equina: Conus extends to the T12-L1 level. Conus and cauda equina appear normal. Paraspinal and other soft tissues: No significant paraspinal or retroperitoneal findings. Disc levels: T12-L1: No significant findings. L1-2: Mild annular bulge and mild facet disease but no disc protrusions, spinal or foraminal stenosis. L2-3: Mild annular bulge and mild facet disease but spinal or foraminal stenosis. L3-4: Bulging annulus, osteophytic ridging and moderate facet disease contributing to early spinal and bilateral lateral recess stenosis. No foraminal stenosis. L4-5: Bulging annulus and small focal central disc protrusion along with facet disease and ligamentum flavum thickening all contributing to mild spinal and bilateral lateral recess stenosis. No foraminal stenosis. L5-S1: Generous spinal canal. Moderate facet disease but no disc protrusions or foraminal stenosis. IMPRESSION: 1. 15 mm lesion in the T11 vertebral body posteriorly consistent with a metastatic lesion. No canal involvement. 2. Small lesion involving the  left S1 pedicle area. 3. Multilevel disc disease and facet disease with early spinal and bilateral lateral recess stenosis at L3-4 and mild spinal and bilateral lateral recess stenosis at L4-5. Electronically Signed   By: PMarijo SanesM.D.   On: 05/25/2022 14:26     ASSESSMENT:  1.  T3N3c (stage IIIc) triple negative invasive lobular carcinoma of the left breast: - She felt lump in her left breast for more than 6 months, but thought it was secondary to fibrocystic disease which she had all her life.  When she started having pain, she reached out to Dr. HSherrie Sport - She previously had mammogram machine malfunction and had severe pain and traumatized by that experience.  She was having ultrasound of the breast every other year since then. - She was on hormone replacement therapy for close to 10 years (from age [id number]. - Ultrasound of the left breast on 01/08/2021 showed hyper vascular hypoechoic mass at 12 o'clock position measuring 3.8 x 1.3 x 2.5 cm.  There are calcifications located within the mass.  Mass extends to the level of the skin with mild associated skin thickening.  At least 3 morphologically abnormal lymph nodes in the left axilla. - Mammogram showed irregular spiculated mass with associated calcifications in the retroareolar to upper left breast measuring approximately 6 cm.  There are at least 4 morphologically abnormal lymph nodes identified in the left axilla. - Ultrasound-guided left breast and left axillary lymph node biopsy on 01/15/2021 - Pathology consistent with invasive lobular carcinoma, E-cadherin negative.  ER/PR/HER2 negative.  HER2 2+ by IHC, negative by FISH.  Ki-67 is 5%.  Lymph node core biopsy was consistent with metastatic carcinoma.  Grade 2. - PET scan on 02/03/2021 showed involvement of left supraclavicular, subpectoral, axillary lymph nodes along with the breast mass.  10 mm left lung nodule which is hypometabolic.  Spinal  cord lesion at T12 level with a strong uptake. -  MRI of the lumbar spine with and without contrast on 02/20/2021 showed no mass or abnormal enhancement within the canal at the T12 level to correspond to the site of PET scan positive.  No marrow replacing bone lesion. - 2D echo on 02/21/2021 with EF 55-60%. - Weekly carboplatin and paclitaxel with every 3 weeks pembrolizumab ([id number]) started on 02/27/2021. - Cycle 1 of AC on 07/04/2021.  Admission to [facility] with neutropenic fever. - Cycle 2 AC with 50% dose reduction on 08/21/2021. - 4 cycles of AC completed on 10/01/2021. - PET scan on 10/16/2021: Reduction in metabolic activity in the size of the left axillary lymph node and no evidence of breast hypermetabolism on PET scan. - Left mastectomy and lymph node excision by Dr. Ninfa Linden on 11/20/2021 - Pathology: 6.2 cm invasive lobular carcinoma, grade 2, margins negative.  19/21 lymph nodes involved with macrometastasis.  ypT3, YPN3A - Adjuvant pembrolizumab started on 01/01/2022. -CREATE-X capecitabine 1500 mg twice daily 2 weeks on/1 week off started on 01/01/2022.     2.  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  She is non-smoker. - Mother died of breast cancer.  Maternal grandmother died very young in her 80s, sister has fibrocystic disease.  Father died of lung cancer and was a smoker.   PLAN:  1.  T3N3c triple negative invasive lobular carcinoma of the left breast: - She has been off of Xeloda since last Wednesday.  She is due to start back on 05/13/2022.  We will hold Xeloda until next visit. - No immunotherapy related side effects. - Reviewed labs today which showed normal LFTs and grossly normal CBC.  We will proceed with next cycle of Keytruda. - She reported left hip pain for the last 6 weeks.  An x-ray of the left hip from 04/22/2022 done at [facility] showed degenerative joint changes of inferior aspect of left SI joint. - She also reported severe headaches mostly right-sided in  the last few months which is not improving. - Recommend MRI of the left hip with and without contrast and brain MRI with and without contrast given her high risk malignancy.  She has an appointment with orthopedics on 05/13/2022 for the same left hip pain.  2.  Hypothyroidism: - Current new Synthroid 75 mcg daily.  TSH is 4.2.  3.  Peripheral neuropathy: - This in the toes has been stable.  4.  Hypomagnesemia: - Magnesium twice daily.  Magnesium is normal.  5.  Grade 2 HFSR of the feet: - After last cycle, she still has peeling of the skin of the soles.  Palms look better.  She also has some pain in the soles when walking. - I have recommended that she hold off on restarting her next cycle.  We will reevaluate and after the soles completely healed, will consider dose reduction of Xeloda to 2 tablets twice daily, 2 weeks on/1 week off.   Orders placed this encounter:  Orders Placed This Encounter  Procedures   NM PET Image Restage (PS) Whole Body     Burns Spain, NP-C, ,[place] [phone]

## 2022-05-26 NOTE — Progress Notes (Signed)
My hip and back hurt.  I had seen her briefly in the office several weeks ago and ordered MRI scans but did not examine her.  I have reviewed her cancer therapy notes in detail.  She has a very aggressive invasive breast cancer that appears not very responsive to treatment.  She had MRI of the hip and it showed: IMPRESSION: 1. Metastatic bone lesions involving the pelvis and left hip as detailed above. The hip lesion is partially destroying the anterior cortex and is likely causing the patient's symptoms. No discrete pathologic fracture but certainly at risk. 2. No muscle or subcutaneous or intrapelvic metastatic lesions.  MRI of the lumbar spine showed: IMPRESSION: 1. 15 mm lesion in the T11 vertebral body posteriorly consistent with a metastatic lesion. No canal involvement. 2. Small lesion involving the left S1 pedicle area. 3. Multilevel disc disease and facet disease with early spinal and bilateral lateral recess stenosis at L3-4 and mild spinal and bilateral lateral recess stenosis at L4-5.  I have independently reviewed the MRI.    She has headaches as well. She had MRI of the brain but I did not order it and I have not told her any of the findings.  She is to see the cancer center doctor today.  I have recommended PET scan.  I have explained the seriousness of the hip findings and possible fracture.  I spent a long time with her, about an hour going over the marked multiple bone metastasis findings.  She has an amazingly positive attitude.  She has been told she does not have "much time".  She has reconciled to this.  She is to see the cancer doctor shortly in a while from now.  Electronically Signed Sanjuana Kava, MD 9/12/20239:49 AM

## 2022-05-27 ENCOUNTER — Telehealth: Payer: Self-pay | Admitting: Radiation Oncology

## 2022-05-27 DIAGNOSIS — C50912 Malignant neoplasm of unspecified site of left female breast: Secondary | ICD-10-CM | POA: Diagnosis not present

## 2022-05-27 DIAGNOSIS — Z171 Estrogen receptor negative status [ER-]: Secondary | ICD-10-CM | POA: Diagnosis not present

## 2022-05-27 DIAGNOSIS — C773 Secondary and unspecified malignant neoplasm of axilla and upper limb lymph nodes: Secondary | ICD-10-CM | POA: Diagnosis not present

## 2022-05-27 NOTE — Telephone Encounter (Signed)
Called patient to r/s CON visit w. Dr. Lisbeth Renshaw on 9/14. No answer, LVM for a return call.

## 2022-05-28 ENCOUNTER — Ambulatory Visit
Admission: RE | Admit: 2022-05-28 | Discharge: 2022-05-28 | Disposition: A | Discharge status: Home / Self Care | Payer: Medicare Other | Source: Ambulatory Visit | Attending: Radiation Oncology | Admitting: Radiation Oncology

## 2022-05-28 ENCOUNTER — Ambulatory Visit (HOSPITAL_COMMUNITY)
Admission: RE | Admit: 2022-05-28 | Discharge: 2022-05-28 | Disposition: A | Discharge status: Home / Self Care | Payer: Medicare Other | Source: Ambulatory Visit | Attending: Hematology | Admitting: Hematology

## 2022-05-28 ENCOUNTER — Ambulatory Visit: Payer: Medicare Other

## 2022-05-28 ENCOUNTER — Ambulatory Visit: Admission: RE | Admit: 2022-05-28 | Payer: Medicare Other | Source: Ambulatory Visit | Admitting: Radiation Oncology

## 2022-05-28 ENCOUNTER — Encounter: Payer: Self-pay | Admitting: Radiation Oncology

## 2022-05-28 DIAGNOSIS — R911 Solitary pulmonary nodule: Secondary | ICD-10-CM | POA: Insufficient documentation

## 2022-05-28 DIAGNOSIS — C7981 Secondary malignant neoplasm of breast: Secondary | ICD-10-CM | POA: Diagnosis not present

## 2022-05-28 DIAGNOSIS — C50912 Malignant neoplasm of unspecified site of left female breast: Secondary | ICD-10-CM

## 2022-05-28 DIAGNOSIS — C7931 Secondary malignant neoplasm of brain: Secondary | ICD-10-CM

## 2022-05-28 DIAGNOSIS — C7951 Secondary malignant neoplasm of bone: Secondary | ICD-10-CM | POA: Insufficient documentation

## 2022-05-28 MED ORDER — FLUDEOXYGLUCOSE F - 18 (FDG) INJECTION
8.9200 | Freq: Once | INTRAVENOUS | Status: AC | PRN
  Administered 2022-05-28: 8.92 via INTRAVENOUS

## 2022-05-28 NOTE — Progress Notes (Signed)
Location/Histology of Brain Tumor: Breast cancer metastatic to bone and brain  Patient presented with complaints of left hip pain for about 6 weeks and severe headaches mostly right sided in the last few months.  The patient has had progressive unrelenting headaches and has also noticed a twitch in her right eye along with inability at times to open her right eye fully.    MRI Brain 3T 05/29/2022:  PET 05/28/2022:  MRI Brain 05/23/2022: 2 enhancing mass lesions in the right hemisphere both consistent with metastatic disease.  The larger mass is 9.4 mm in diameter, and the smaller is 5.5 mm in diameter.   MRI L Spine 05/23/2022: 15 mm lesion in the T11 vertebral body consistent with metastatic lesion, small lesion involving the left S1 pedicle area, multilevel disc disease with early spinal stenosis at L3-4 and L4-5.  MRI Left Hip 05/23/2022: Metastatic bone lesion involving the pelvis and left hip.  The hip lesion is partially destroying the anterior cortex.   Past or anticipated interventions, if any, per neurosurgery:    Past or anticipated interventions, if any, per medical oncology:  Dr. Guy Begin NP 05/27/2022 -She is very much aware that she has an aggressive and incurable form of cancer which has spread to her bone and brain.   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 -Given her incurable status she is emphatic that she does not want to be resuscitated or intubated.   - She is not against chemotherapy but does not want to have any chemotherapy which would increase her risk of a stroke. -She is hopeful that with radiation and some form of systemic therapy she can continue to prolong her life.  She has an excellent support system with a strong faith community, as well as 2 daughters who live nearby with her husband's and grandchildren.  -I have advised her that until we know the full extent of the burden of disease we cannot give her  an accurate prognosis.   -We will obtain the the results of the pending PET scan, along with the more focused MRI of the brain and bring her back for discussion of treatment options.   Ambulatory status? Walker? Wheelchair?: Ambulatory, uses a rolling walker in her home occasionally.  She reports some limping due to left leg/hip pain.   Dose of Decadron, if applicable:   Recent neurologic symptoms, if any:  Seizures: No Headaches: Daily headaches, has not taken any medication. Nausea: She reports occasional nausea due to baseline ischemic bowel syndrome.  Exacerbated by types of foods she eats. Dizziness/ataxia: Notes some dizziness especially when she feels a headache coming on.   Difficulty with hand coordination:  Focal numbness/weakness: Weakness in her left leg.  Has some numbness residual from chemo. Visual deficits/changes: Bilateral blurred vision.  Right eye on occasion won't open up.  Notes her eyes are more dry.  Using eye drops once daily. Confusion/Memory deficits: She reports some word finding on occasion. Pain: Left hip pain, she is trying to stay active. If Spine Met(s), symptoms, if any, include: Bowel/Bladder retention or incontinence (please describe): She has ischemic bowel syndrome at baseline.  She takes miralax PRN.   SAFETY ISSUES: Prior radiation? No Pacemaker/ICD? No Possible current pregnancy? Hysterectomy Is the patient on methotrexate? No  Additional Complaints / other details:  Left Breast Cancer 11/20/2021

## 2022-05-28 NOTE — Progress Notes (Addendum)
Radiation Oncology         ([phone]) [phone] ________________________________  Initial Outpatient Consultation - Conducted via telephone at patient request.  I spoke with the patient to conduct this consult visit via telephone. The patient was notified in advance and was offered an in person or telemedicine meeting to allow for face to face communication but instead preferred to proceed with a telephone consult.      Attestation:    Please see the note from Shona Simpson, PA-C from today's visit for more details of today's encounter.  I have personally performed a face to face diagnostic evaluation on this patient and devised the following assessment and plan.  The patient underwent evaluation for her diagnosis of stage IV breast cancer.  Recent imaging has demonstrated 2 small subcentimeter brain metastases which appear good candidates for radiosurgery.  This was discussed with the patient today.  She has also been having some pain in the left pelvic region and an MRI scan did confirm metastasis associated with this discomfort as well.  We discussed treating this area palliatively.  She does have additional disease which has been noted including a metastasis in the T11 vertebral body.  She does have a PET scan coming up as well and we discussed that we would like to treat at a minimum her left hip and the 2 brain metastases and we will review her PET scan to help determine if any additional areas would be appropriate at this time.  All of her questions were answered and she does wish to proceed with such treatment.  She will undergo simulation in the near future for treatment planning.  Staging-stage IV breast cancer  Kyung Rudd, MD     Name: Denise [name]        MRN: [id number]  Date of Service: 05/28/2022 DOB: 12-03-1950  XB:[id number], [name], MD  Derek Jack, MD     REFERRING PHYSICIAN: Derek Jack, MD   DIAGNOSIS: The encounter diagnosis was Invasive lobular  carcin[facility]   HISTORY OF PRESENT ILLNESS: Denise [name] is a 71 y.o. female seen at the request of Dr. Delton Coombes for a new diagnosis of metastatic brain disease from her history of breast cancer.  The patient has a history of stage IIIC, cT3N3c triple negative invasive lobular carcinoma of the left breast for which she underwent neoadjuvant chemo and immunotherapy beginning in June 2022 through January 2023.  She has remained on Keytruda with her last dose and consolidation being on 05/11/2022.  PET  imaging prior to her starting systemic therapy showed left supraclavicular subpectoral and axillary adenopathy in addition to her breast mass.  A 10 mm left lung nodule did not have any hypermetabolic activity.  There was focal hypermetabolic change in the central spinal canal at T12 with no hypermetabolic osseous lesions.  She did undergo interval left mastectomy on 11/30/2021.  This showed a 6.2 cm invasive and in situ lobular carcinoma with negative margins, she had 21 lymph nodes removed 19 of which were involved with carcinoma and focal extranodal extension was identified.  Given this finding she was also started on capecitabine postoperatively (this has just recently been discontinued).  The plan was to refer her for adjuvant radius therapy after completing her systemic treatment.  Unfortunately the patient developed Left hip pain and MRI of the lumbar spine on 05/23/2022 showed a 15 mm lesion in the T11 vertebral body consistent with a metastatic deposit, as well as disc disease  and a small lesion in the left S1 pedicle.  She met back with Dr. Delton Coombes and had been describing headaches as well.  An MRI on 05/23/2022 of the brain showed 2 mass lesions in the right frontoparietal junction within the precentral gyrus consistent with metastatic disease.  The larger measured 9.4 mm and the smaller measured 5.5 mm no significant mass effect or hemorrhage was identified and moderate  chronic small vessel ischemic changes of the cerebral hemisphere white matter were identified.  Her MRI of the left hip also that day showed a metastatic lesion involving the pelvis and left hip in the intertrochanteric region medially and involving the cortex.  No involvement of the lesser trochanter was seen and no pathologic fracture was identified.  A second small lesion involving the left ischium was seen without pathologic fracture a subcortical lesion involving the great trochanter again was also seen but without fracture.  Bilateral symmetric sacral lesions near the SI joints were favored to be degenerative.  She is going to have a PET scan later today and is contacted by phone to review recommendations for treatment of her brain disease and possible palliative radiation to her spine and hip.    PREVIOUS RADIATION THERAPY: No   PAST MEDICAL HISTORY:  Past Medical History:  Diagnosis Date   Asthma    as child   Cancer ([place]) 12/2020   left breast IMC   Complication of anesthesia    pateitn states,' I coded when I had my D&Cmany years ago.   Dyspnea    Family history of breast cancer    Hypertension    Hypothyroidism    PONV (postoperative nausea and vomiting)    Port-A-Cath in place 02/26/2021       PAST SURGICAL HISTORY: Past Surgical History:  Procedure Laterality Date   ABDOMINAL HYSTERECTOMY     BIOPSY  01/17/2018   Procedure: BIOPSY;  Surgeon: Rogene Houston, MD;  [facility];  Service: Endoscopy;;  duodenum,gastric   CHOLECYSTECTOMY     COLONOSCOPY WITH PROPOFOL N/A 01/17/2018   Procedure: COLONOSCOPY WITH PROPOFOL;  Surgeon: Rogene Houston, MD;  [facility];  Service: Endoscopy;  Laterality: N/A;  7:30   DILATION AND CURETTAGE OF UTERUS     ESOPHAGOGASTRODUODENOSCOPY (EGD) WITH PROPOFOL N/A 01/17/2018   Procedure: ESOPHAGOGASTRODUODENOSCOPY (EGD) WITH PROPOFOL;  Surgeon: Rogene Houston, MD;  [facility];  Service: Endoscopy;   Laterality: N/A;   POLYPECTOMY  01/17/2018   Procedure: POLYPECTOMY;  Surgeon: Rogene Houston, MD;  [facility];  Service: Endoscopy;;  transverse colon, cecal   PORTACATH PLACEMENT Right 02/17/2021   Procedure: INSERTION PORT-A-CATH;  Surgeon: Coralie Keens, MD;  Location: [place];  Service: General;  Laterality: Right;   RADIOACTIVE SEED GUIDED AXILLARY SENTINEL LYMPH NODE Left 11/20/2021   Procedure: RADIOACTIVE SEED GUIDED LEFT AXILLARY SENTINEL LYMPH NODE DISSECTION;  Surgeon: Coralie Keens, MD;  Location: [place];  Service: General;  Laterality: Left;   TOTAL MASTECTOMY Left 11/20/2021   Procedure: LEFT TOTAL MASTECTOMY;  Surgeon: Coralie Keens, MD;  Location: [place];  Service: General;  Laterality: Left;     FAMILY HISTORY:  Family History  Problem Relation Age of Onset   Breast cancer Mother        dx in her 9s   Heart disease Mother    Thyroid disease Mother    Lung cancer Father        dx  in his 20s   Thyroid disease Maternal Aunt    Thyroid nodules Maternal Grandmother 35       goiter   Thyroid disease Maternal Grandmother    Heart disease Maternal Grandfather    Heart disease Paternal Grandmother    Heart disease Paternal Grandfather    Thyroid disease Daughter    Thyroid disease Daughter    Cancer Maternal Uncle        NOS   Cancer Paternal Uncle        NOS   Breast cancer Cousin        pat first cousin died in her 38s;      SOCIAL HISTORY:  reports that she has never smoked. She has never used smokeless tobacco. She reports that she does not drink alcohol and does not use drugs.  Patient is widowed and lives in [place].   ALLERGIES: Exforge [amlodipine besylate-valsartan], Tape, Cheese, Levofloxacin in d5w, Strawberry extract, Tramadol hcl, and Yeast-related products   MEDICATIONS:  Current Outpatient Medications  Medication Sig Dispense Refill   Acetaminophen  (TYLENOL PO) Take 1 tablet by mouth as needed.     albuterol (VENTOLIN HFA) 108 (90 Base) MCG/ACT inhaler Inhale into the lungs.     benazepril (LOTENSIN) 20 MG tablet Take by mouth.     clobetasol cream (TEMOVATE) 2.45 % Apply 1 Application topically 2 (two) times daily. Apply to hands and feet 30 g 3   gabapentin (NEURONTIN) 300 MG capsule Take 1 capsule by mouth at bedtime.     levothyroxine (SYNTHROID) 75 MCG tablet Take 100 mcg by mouth daily before breakfast.     lidocaine (XYLOCAINE) 5 % ointment Apply 1 Application topically 2 (two) times daily. Apply to hands and feet 35.44 g 2   LORazepam (ATIVAN) 0.5 MG tablet Take 0.5 mg by mouth daily as needed.     losartan (COZAAR) 50 MG tablet Take 50 mg by mouth daily.     magnesium oxide (MAG-OX) 400 MG tablet Take 1 tablet (400 mg total) by mouth 2 (two) times daily. 60 tablet 6   Melatonin 5 MG TABS Take 10 mg by mouth. As needed for sleep     Omega-3 Fatty Acids (FISH OIL PO) Take by mouth.     Pembrolizumab (KEYTRUDA IV) Inject into the vein every 21 ( twenty-one) days.     prochlorperazine (COMPAZINE) 10 MG tablet Take by mouth.     Tiotropium Bromide-Olodaterol (STIOLTO RESPIMAT) 2.5-2.5 MCG/ACT AERS Inhale 1-2 puffs into the lungs as directed.     No current facility-administered medications for this encounter.     REVIEW OF SYSTEMS: On review of systems, the patient reports that she is having pain when she's standing, or bearing weight on her left leg. She reports that she has had headaches for years that have been related to sinus allergies, and that ibuprofen typically helps. She reports her headaches of late have been more intense and lasting longer but are still improved by ibuprofen. She has noticed nausea in the morning as well when she has these headaches. No other complaints are verbalized.       PHYSICAL EXAM:  Unable to assess given encounter type    ECOG = 1  0 - Asymptomatic (Fully active, able to carry on all  predisease activities without restriction)  1 - Symptomatic but completely ambulatory (Restricted in physically strenuous activity but ambulatory and able to carry out work of a light or sedentary nature. For example, light  housework, office work)  2 - Symptomatic, <50% in bed during the day (Ambulatory and capable of all self care but unable to carry out any work activities. Up and about more than 50% of waking hours)  3 - Symptomatic, >50% in bed, but not bedbound (Capable of only limited self-care, confined to bed or chair 50% or more of waking hours)  4 - Bedbound (Completely disabled. Cannot carry on any self-care. Totally confined to bed or chair)  5 - Death   Eustace Pen MM, Creech RH, Tormey DC, et al. [phone]). "Toxicity and response criteria of the [facility]". [place] Oncol. 5 (6): 649-55    LABORATORY DATA:  Lab Results  Component Value Date   WBC 3.2 (L) 05/11/2022   HGB 11.2 (L) 05/11/2022   HCT 32.4 (L) 05/11/2022   MCV 111.3 (H) 05/11/2022   PLT 195 05/11/2022   Lab Results  Component Value Date   NA 137 05/11/2022   K 4.1 05/11/2022   CL 108 05/11/2022   CO2 23 05/11/2022   Lab Results  Component Value Date   ALT 13 05/11/2022   AST 19 05/11/2022   ALKPHOS 75 05/11/2022   BILITOT 0.5 05/11/2022      RADIOGRAPHY: MR HIP LEFT W WO CONTRAST  Result Date: 05/25/2022 CLINICAL DATA:  Left hip pain. History of breast cancer. EXAM: MRI OF THE LEFT HIP WITHOUT AND WITH CONTRAST TECHNIQUE: Multiplanar, multisequence MR imaging was performed both before and after administration of intravenous contrast. CONTRAST:  9mL GADAVIST GADOBUTROL 1 MMOL/ML IV SOLN COMPARISON:  PET-CT 10/16/2021 FINDINGS: There is a metastatic lesion involving the left hip. This is in the intertrochanteric region medially and involves the cortex which is markedly thinned. No involvement of the lesser trochanter. No acute pathologic fracture. There is a second small lesion  involving the left ischium. This is also a cortical lesion but no pathologic fracture. Small subcortical lesion involving the greater trochanter but no fracture. Bilateral symmetric sacral lesions near the SI joints could be degenerative changes. No muscle lesions or subcutaneous lesions. No significant intrapelvic abnormalities. Diffuse sigmoid colon diverticulosis is noted. IMPRESSION: 1. Metastatic bone lesions involving the pelvis and left hip as detailed above. The hip lesion is partially destroying the anterior cortex and is likely causing the patient's symptoms. No discrete pathologic fracture but certainly at risk. 2. No muscle or subcutaneous or intrapelvic metastatic lesions. Electronically Signed   By: Marijo Sanes M.D.   On: 05/25/2022 14:39   MR Brain W Wo Contrast  Result Date: 05/25/2022 CLINICAL DATA:  Headache, new or worsening. Severe frontal headache. History of breast cancer. EXAM: MRI HEAD WITHOUT AND WITH CONTRAST TECHNIQUE: Multiplanar, multiecho pulse sequences of the brain and surrounding structures were obtained without and with intravenous contrast. CONTRAST:  65mL GADAVIST GADOBUTROL 1 MMOL/ML IV SOLN COMPARISON:  CT 01/29/2022 FINDINGS: Brain: Diffusion imaging does not show any acute or subacute infarction. There is a background pattern brain volume loss with chronic small-vessel ischemic changes of a moderate degree affecting the cerebral hemispheric white matter. There are 2 enhancing mass lesions in the right hemisphere within what I think is probably the precentral gyrus. These are consistent with metastatic masses. The larger mass measures 9.4 mm in diameter and the smaller mass measures 5.5 mm in diameter. No evidence of hemorrhage or mass effect. No hydrocephalus or extra-axial collection. Vascular: Major vessels at the base of the brain show flow. Skull and upper cervical spine: No evidence of calvarial  or skull base metastatic disease. Sinuses/Orbits: Clear/normal Other:  None IMPRESSION: Two mass lesions at the right frontoparietal junction region, favored to be within the precentral gyrus, consistent with metastatic disease. The larger measures 9.4 mm in diameter and the smaller measures 5.5 mm in diameter. No significant mass effect or hemorrhage. Moderate chronic small-vessel ischemic changes of the cerebral hemispheric white matter. Electronically Signed   By: [name] Chimes M.D.   On: 05/25/2022 14:31   MR Lumbar Spine W Wo Contrast  Result Date: 05/25/2022 CLINICAL DATA:  Low back pain. History of breast cancer. EXAM: MRI LUMBAR SPINE WITHOUT AND WITH CONTRAST TECHNIQUE: Multiplanar and multiecho pulse sequences of the lumbar spine were obtained without and with intravenous contrast. CONTRAST:  60m GADAVIST GADOBUTROL 1 MMOL/ML IV SOLN COMPARISON:  PET-CT 10/17/2021 and prior MRI lumbar spine 02/20/2021 FINDINGS: Segmentation: There are five lumbar type vertebral bodies. The last full intervertebral disc space is labeled L5-S1. Alignment:  Normal Vertebrae: There is a 15 mm lesion in the T11 vertebral body posteriorly which demonstrates low T1 and high T2 signal intensity and subsequent contrast enhancement and is consistent with a metastatic lesion. No canal involvement. Small lesion also involving the left S1 pedicle area. No canal stenosis. Benign T12 hemangioma and small Schmorl's nodes. Conus medullaris and cauda equina: Conus extends to the T12-L1 level. Conus and cauda equina appear normal. Paraspinal and other soft tissues: No significant paraspinal or retroperitoneal findings. Disc levels: T12-L1: No significant findings. L1-2: Mild annular bulge and mild facet disease but no disc protrusions, spinal or foraminal stenosis. L2-3: Mild annular bulge and mild facet disease but spinal or foraminal stenosis. L3-4: Bulging annulus, osteophytic ridging and moderate facet disease contributing to early spinal and bilateral lateral recess stenosis. No foraminal stenosis.  L4-5: Bulging annulus and small focal central disc protrusion along with facet disease and ligamentum flavum thickening all contributing to mild spinal and bilateral lateral recess stenosis. No foraminal stenosis. L5-S1: Generous spinal canal. Moderate facet disease but no disc protrusions or foraminal stenosis. IMPRESSION: 1. 15 mm lesion in the T11 vertebral body posteriorly consistent with a metastatic lesion. No canal involvement. 2. Small lesion involving the left S1 pedicle area. 3. Multilevel disc disease and facet disease with early spinal and bilateral lateral recess stenosis at L3-4 and mild spinal and bilateral lateral recess stenosis at L4-5. Electronically Signed   By: PMarijo SanesM.D.   On: 05/25/2022 14:26       IMPRESSION/PLAN: 1. Progressive Metastatic Stage IIIC, cT3N3c triple negative invasive lobular carcinoma of the left breast with brain and bone disease. Dr. MLisbeth Renshawdiscusses the patient's imaging and work-up as well as treatment to date.  He discusses the rationale for reviewing her PET scan to consider palliative radiation not only to her left hip but to potentially any other sites that could potentially cause impending fracture or create difficulties or compromise for her spinal canal.  Regarding her brain disease, she has 2 subcentimeter lesions that would be eligible for treatment with stereotactic radiosurgery  ([facility].  Dr. MLisbeth Renshawdiscusses the rationale for a 3 Tesla MRI scan to confirm that she is still a candidate and also this would be used for treatment planning.  She does not have a relationship with a neurosurgeon so we will introduce her to Dr. [place]nette St[place] and did detailed the rationale for this relationship.  Dr. MLisbeth Renshawrecommends a single fraction of SRS to the 2  lesions in the right precentral gyrus.  We also discussed the rationale for simulation and to make an immobilizing mask.  Our brain oncology  navigator has also been working with trying to coordinate some of her appointments so she has an appointment to come in for simulation next Tuesday and subsequent treatment on 06/05/2022. We discussed the risks, benefits, short, and long term effects of radiotherapy, as well as the curative intent,  we also detailed the rationale for the palliative course of radiotherapy to her bony disease which she understands would be palliative intent, and the patient is interested in proceeding. After further discussion she needs to move the timing of her MRI, and simulation until after she helps her daughter with a medical procedure. We will try to reschedule these appointments for the week of 06/08/22, and due to being out of town the weekend of 06/12/22, she desires therapy to start the week of 06/15/22.  2. Headaches. The patient is aware we would have a low threshold to add steroids for progressive symptoms, but currently the lesions by MRI do not appear to cause edema warranting steroids for her symptoms. She will keep [place] updated with her symptoms.  3. Consideration of local radiotherapy. We did discuss that possible chest wall radiation could be considered pending further assessment by PET and her response to therapy. She is in agreement to revisit this.    This encounter was conducted via telephone.  The patient has provided two factor identification and has given verbal consent for this type of encounter and has been advised to only accept a meeting of this type in a secure network environment. The time spent during this encounter was 60 minutes including preparation, discussion, and coordination of the patient's care. The attendants for this meeting include Blenda Nicely, RN, Dr. Lisbeth Renshaw, Hayden Pedro  and Charlynne Pander.  During the encounter,  Blenda Nicely, RN, Dr. Lisbeth Renshaw, and Hayden Pedro were located at [facility] Radiation Oncology Department.  Charlynne Pander was located  at [facility] waiting for today's PET scan.      Carola Rhine, [facility]   **Disclaimer: This note was dictated with voice recognition software. Similar sounding words can inadvertently be transcribed and this note may contain transcription errors which may not have been corrected upon publication of note.**

## 2022-05-29 ENCOUNTER — Inpatient Hospital Stay: Admission: RE | Admit: 2022-05-29 | Payer: Medicare Other | Source: Ambulatory Visit

## 2022-06-01 ENCOUNTER — Inpatient Hospital Stay: Payer: Medicare Other

## 2022-06-01 ENCOUNTER — Inpatient Hospital Stay: Payer: Medicare Other | Admitting: Physician Assistant

## 2022-06-02 ENCOUNTER — Ambulatory Visit: Payer: Medicare Other | Admitting: Radiation Oncology

## 2022-06-02 ENCOUNTER — Ambulatory Visit: Payer: Medicare Other

## 2022-06-04 DIAGNOSIS — C50912 Malignant neoplasm of unspecified site of left female breast: Secondary | ICD-10-CM | POA: Diagnosis not present

## 2022-06-04 DIAGNOSIS — C7951 Secondary malignant neoplasm of bone: Secondary | ICD-10-CM | POA: Diagnosis not present

## 2022-06-04 DIAGNOSIS — C7931 Secondary malignant neoplasm of brain: Secondary | ICD-10-CM | POA: Diagnosis not present

## 2022-06-05 ENCOUNTER — Ambulatory Visit: Payer: Medicare Other | Admitting: Radiation Oncology

## 2022-06-05 ENCOUNTER — Ambulatory Visit
Admission: RE | Admit: 2022-06-05 | Discharge: 2022-06-05 | Disposition: A | Discharge status: Home / Self Care | Payer: Medicare Other | Source: Ambulatory Visit | Attending: Radiation Oncology | Admitting: Radiation Oncology

## 2022-06-05 DIAGNOSIS — C7931 Secondary malignant neoplasm of brain: Secondary | ICD-10-CM

## 2022-06-05 MED ORDER — SODIUM CHLORIDE 0.9% FLUSH
10.0000 mL | INTRAVENOUS | Status: DC | PRN
  Administered 2022-06-05: 10 mL via INTRAVENOUS

## 2022-06-05 MED ORDER — HEPARIN SOD (PORK) LOCK FLUSH 100 UNIT/ML IV SOLN
500.0000 [IU] | Freq: Once | INTRAVENOUS | Status: AC
  Administered 2022-06-05: 500 [IU] via INTRAVENOUS

## 2022-06-05 MED ORDER — GADOBENATE DIMEGLUMINE 529 MG/ML IV SOLN
17.0000 mL | Freq: Once | INTRAVENOUS | Status: AC | PRN
  Administered 2022-06-05: 17 mL via INTRAVENOUS

## 2022-06-08 ENCOUNTER — Inpatient Hospital Stay (HOSPITAL_BASED_OUTPATIENT_CLINIC_OR_DEPARTMENT_OTHER): Payer: Medicare Other | Admitting: Hematology

## 2022-06-08 ENCOUNTER — Encounter: Payer: Self-pay | Admitting: Hematology

## 2022-06-08 ENCOUNTER — Inpatient Hospital Stay: Payer: Medicare Other

## 2022-06-08 VITALS — BP 151/89 | HR 67 | Temp 98.4°F | Resp 18 | Wt 179.2 lb

## 2022-06-08 DIAGNOSIS — E041 Nontoxic single thyroid nodule: Secondary | ICD-10-CM | POA: Diagnosis not present

## 2022-06-08 DIAGNOSIS — Z95828 Presence of other vascular implants and grafts: Secondary | ICD-10-CM | POA: Diagnosis not present

## 2022-06-08 DIAGNOSIS — Z171 Estrogen receptor negative status [ER-]: Secondary | ICD-10-CM | POA: Diagnosis not present

## 2022-06-08 DIAGNOSIS — C50912 Malignant neoplasm of unspecified site of left female breast: Secondary | ICD-10-CM | POA: Diagnosis not present

## 2022-06-08 DIAGNOSIS — C7951 Secondary malignant neoplasm of bone: Secondary | ICD-10-CM | POA: Diagnosis not present

## 2022-06-08 DIAGNOSIS — C7931 Secondary malignant neoplasm of brain: Secondary | ICD-10-CM | POA: Diagnosis not present

## 2022-06-08 DIAGNOSIS — Z79899 Other long term (current) drug therapy: Secondary | ICD-10-CM | POA: Diagnosis not present

## 2022-06-08 NOTE — Progress Notes (Signed)
I met with the patient today during and following visit with Dr. Delton Coombes. I provided the patient with written information on Sacituzumab as discussed with Dr. Delton Coombes. Second opinion referral made to Dr. Wetzel Bjornstad at [facility] per patient request. No follow-up provided at this time, patient states that she will call to schedule follow-up and start of treatment when ready.

## 2022-06-08 NOTE — Progress Notes (Signed)
DISCONTINUE ON PATHWAY REGIMEN - Breast     Cycles 1 through 4: A cycle is every 21 days:     Pembrolizumab      Paclitaxel      Carboplatin      Filgrastim-xxxx    Cycles 5 through 8: A cycle is every 21 days:     Pembrolizumab      Doxorubicin      Cyclophosphamide      Pegfilgrastim-xxxx   **Always confirm dose/schedule in your pharmacy ordering system**  REASON: Disease Progression PRIOR TREATMENT: [id number]: Pembrolizumab 200 mg D1 + Paclitaxel 80 mg/m2 D1, 8, 15 + Carboplatin AUC=1.5 D1, 8, 15 q21 Days x 12 Weeks, Followed by Pembrolizumab 200 mg + Doxorubicin + Cyclophosphamide q21 Days x 12 Weeks, Followed by Surgery TREATMENT RESPONSE: Progressive Disease (PD)  START ON PATHWAY REGIMEN - Breast     A cycle is every 21 days:     Sacituzumab [id number]   **Always confirm dose/schedule in your pharmacy ordering system**  Patient Characteristics: Distant Metastases or Locoregional Recurrent Disease - Unresected or Locally Advanced Unresectable Disease Progressing after Neoadjuvant and Local Therapies, ER Negative/Unknown, Chemotherapy, HER2 Negative/Unknown, Second Line Therapeutic Status: Distant Metastases HER2 Status: Negative (-) ER Status: Negative (-) PR Status: Negative (-) Therapy Approach Indicated: Standard Chemotherapy/Endocrine Therapy Line of Therapy: Second Line Intent of Therapy: Non-Curative / Palliative Intent, Discussed with Patient

## 2022-06-08 NOTE — Patient Instructions (Addendum)
[facility]  Discharge Instructions  You were seen and examined today by Dr. Delton Coombes.  Dr. Delton Coombes discussed your most recent scans. As you know, your breast cancer has metastasized to your brain and bones. Please follow-up with Radiation Oncology as scheduled to proceed with radiation.  This means that the cancer has progressed despite previous treatment. Dr. Delton Coombes has recommended change in treatment, this will begin following completion of radiation therapy. Stop Xeloda.  We have sent two tests one on blood and one on the surgical tissue to see if there are any targetable mutations present in your cancer. The blood test is back, but showed no targetable mutations.  New therapy would be a medication known as Sacituzumab given on Days 1 and 8 every 21 days. It is an IV medication given here in the [place]. This will be given for as long as it helps.  Dr. Delton Coombes will refer you to Dr. Wetzel Bjornstad at [facility] for a second opinion.  Follow-up as scheduled.  Thank you for choosing [facility] to provide your oncology and hematology care.   To afford each patient quality time with our provider, please arrive at least 15 minutes before your scheduled appointment time. You may need to reschedule your appointment if you arrive late (10 or more minutes). Arriving late affects you and other patients whose appointments are after yours.  Also, if you miss three or more appointments without notifying the office, you may be dismissed from the clinic at the provider's discretion.    Again, thank you for choosing [facility].  Our hope is that these requests will decrease the amount of time that you wait before being seen by our physicians.   If you have a lab appointment with the [facility] please come in thru the Main Entrance and check in at the main information desk.            _____________________________________________________________  Should you have questions after your visit to [facility], please contact our office at [phone] and follow the prompts.  Our office hours are 8:00 a.m. to 4:30 p.m. Monday - Thursday and 8:00 a.m. to 2:30 p.m. Friday.  Please note that voicemails left after 4:00 p.m. may not be returned until the following business day.  We are closed weekends and all major holidays.  You do have access to a nurse 24-7, just call the main number to the clinic [phone] and do not press any options, hold on the line and a nurse will answer the phone.    For prescription refill requests, have your pharmacy contact our office and allow 72 hours.    Masks are optional in the cancer centers. If you would like for your care team to wear a mask while they are taking care of you, please let them know. You may have one support person who is at least 73 years old accompany you for your appointments.

## 2022-06-08 NOTE — Progress Notes (Signed)
[place], [place] [address]   CLINIC:  Medical Oncology/Hematology  PCP:  Neale Burly, MD [place] / [place] [address] [phone]   REASON FOR VISIT:  Metastatic triple negative breast cancer to the bones.  PRIOR THERAPY: 1.  Neoadjuvant chemotherapy with weekly carboplatin and paclitaxel and dose dense AC with pembrolizumab  2. Left mastectomy and lymph node excision by Dr. Ninfa Linden on 11/20/2021  NGS Results: Requested and pending  CURRENT THERAPY: Sacituzumab govitecan  BRIEF ONCOLOGIC HISTORY:  Oncology History  Invasive lobular carcinoma of left breast in female [facility])  01/23/2021 Initial Diagnosis   Invasive lobular carcinoma of left breast in female [facility])   02/19/2021 Genetic Testing   Negative genetic testing on the CancerNext-Expanded+RNAinsight panel.  SMARCB1 VUS identified.  The CancerNext-Expanded gene panel offered by [facility] and includes sequencing and rearrangement analysis for the following 77 genes: AIP, ALK, APC*, ATM*, AXIN2, BAP1, BARD1, BLM, BMPR1A, BRCA1*, BRCA2*, BRIP1*, CDC73, CDH1*, CDK4, CDKN1B, CDKN2A, CHEK2*, CTNNA1, DICER1, FANCC, FH, FLCN, GALNT12, KIF1B, LZTR1, MAX, MEN1, MET, MLH1*, MSH2*, MSH3, MSH6*, MUTYH*, NBN, NF1*, NF2, NTHL1, PALB2*, PHOX2B, PMS2*, POT1, PRKAR1A, PTCH1, PTEN*, RAD51C*, RAD51D*, RB1, RECQL, RET, SDHA, SDHAF2, SDHB, SDHC, SDHD, SMAD4, SMARCA4, SMARCB1, SMARCE1, STK11, SUFU, TMEM127, TP53*, TSC1, TSC2, VHL and XRCC2 (sequencing and deletion/duplication); EGFR, EGLN1, HOXB13, KIT, MITF, PDGFRA, POLD1, and POLE (sequencing only); EPCAM and GREM1 (deletion/duplication only). DNA and RNA analyses performed for * genes. The report date is February 19, 2021.   02/27/2021 - 05/11/2022 Chemotherapy   Patient is on Treatment Plan : BREAST Pembrolizumab + Carboplatin D1,8,15+ Paclitaxel D1,8,15 q21d X 4 cycles / Pembrolizumab + AC q21d x 4 cycles     12/22/2021 Cancer Staging   Staging  form: Breast, AJCC 8th Edition - Pathologic stage from 12/22/2021: No Stage Recommended (ypT3, pN3a, cM0, G2, ER-, PR-, HER2-) - Signed by Derek Jack, MD on 12/22/2021 Histopathologic type: Lobular carcinoma, NOS Stage prefix: Post-therapy Method of lymph node assessment: Axillary lymph node dissection Multigene prognostic tests performed: None Histologic grading system: 3 grade system     CANCER STAGING:  Cancer Staging  Invasive lobular carcinoma of left breast in female [facility]) Staging form: Breast, AJCC 8th Edition - Clinical stage from 01/23/2021: cT3, cN3c, G2, ER-, PR-, HER2- - Unsigned - Pathologic stage from 12/22/2021: No Stage Recommended (ypT3, pN3a, cM0, G2, ER-, PR-, HER2-) - Signed by Derek Jack, MD on 12/22/2021   INTERVAL HISTORY:  Denise [name], a 71 y.o. female, seen for follow-up of metastatic triple negative breast cancer to the bones.  She reports that left hip pain has improved.  She is taking ibuprofen 3 tablets twice daily.  She is not taking tramadol.  She reports some heartburn and takes Mylanta as needed.  She reports her headaches are also better.  She is taking Tylenol 2 tablets every 4 hours as needed.  She met with radiation oncology and is being planned for [facility] of the brain lesions.  She also complains of left shoulder and left arm pain.  REVIEW OF SYSTEMS:  Review of Systems  Constitutional:  Positive for fatigue. Negative for appetite change.  HENT:   Positive for sore throat.   Respiratory:  Negative for shortness of breath.   Gastrointestinal:  Negative for diarrhea, nausea and vomiting.  Musculoskeletal:  Positive for arthralgias (Left hip pain, left shoulder pain).  Skin:  Negative for rash.       Peeling -  feet  Neurological:  Positive for headaches and numbness (toes improving.). Negative for dizziness.  Psychiatric/Behavioral:  Positive for sleep disturbance.   All other systems reviewed and are negative.   PAST  MEDICAL/SURGICAL HISTORY:  Past Medical History:  Diagnosis Date   Asthma    as child   Cancer ([place]) 12/2020   left breast IMC   Complication of anesthesia    pateitn states,' I coded when I had my D&Cmany years ago.   Dyspnea    Family history of breast cancer    Hypertension    Hypothyroidism    PONV (postoperative nausea and vomiting)    Port-A-Cath in place 02/26/2021   Past Surgical History:  Procedure Laterality Date   ABDOMINAL HYSTERECTOMY     BIOPSY  01/17/2018   Procedure: BIOPSY;  Surgeon: Rogene Houston, MD;  [facility];  Service: Endoscopy;;  duodenum,gastric   CHOLECYSTECTOMY     COLONOSCOPY WITH PROPOFOL N/A 01/17/2018   Procedure: COLONOSCOPY WITH PROPOFOL;  Surgeon: Rogene Houston, MD;  [facility];  Service: Endoscopy;  Laterality: N/A;  7:30   DILATION AND CURETTAGE OF UTERUS     ESOPHAGOGASTRODUODENOSCOPY (EGD) WITH PROPOFOL N/A 01/17/2018   Procedure: ESOPHAGOGASTRODUODENOSCOPY (EGD) WITH PROPOFOL;  Surgeon: Rogene Houston, MD;  [facility];  Service: Endoscopy;  Laterality: N/A;   POLYPECTOMY  01/17/2018   Procedure: POLYPECTOMY;  Surgeon: Rogene Houston, MD;  [facility];  Service: Endoscopy;;  transverse colon, cecal   PORTACATH PLACEMENT Right 02/17/2021   Procedure: INSERTION PORT-A-CATH;  Surgeon: Coralie Keens, MD;  Location: [place];  Service: General;  Laterality: Right;   RADIOACTIVE SEED GUIDED AXILLARY SENTINEL LYMPH NODE Left 11/20/2021   Procedure: RADIOACTIVE SEED GUIDED LEFT AXILLARY SENTINEL LYMPH NODE DISSECTION;  Surgeon: Coralie Keens, MD;  Location: [place];  Service: General;  Laterality: Left;   TOTAL MASTECTOMY Left 11/20/2021   Procedure: LEFT TOTAL MASTECTOMY;  Surgeon: Coralie Keens, MD;  Location: [place];  Service: General;  Laterality: Left;    SOCIAL HISTORY:  Social History   Socioeconomic History   Marital status:  Widowed    Spouse name: Not on file   Number of children: 3   Years of education: Not on file   Highest education level: Not on file  Occupational History   Occupation: [employer]   Occupation: retired  Tobacco Use   Smoking status: Never   Smokeless tobacco: Never  Vaping Use   Vaping Use: Never used  Substance and Sexual Activity   Alcohol use: Never   Drug use: Never   Sexual activity: Not Currently    Birth control/protection: Surgical  Other Topics Concern   Not on file  Social History Narrative   Not on file   Social Determinants of Health   Financial Resource Strain: [place]  (01/22/2021)   Overall Financial Resource Strain (CARDIA)    Difficulty of Paying Living Expenses: Not hard at all  Food Insecurity: No [place] (01/22/2021)   Hunger Vital Sign    Worried About Running Out of Food in the Last Year: Never true    [place] in the Last Year: Never true  Transportation Needs: No Transportation Needs (01/22/2021)   PRAPARE - Hydrologist (Medical): No    Lack of Transportation (Non-Medical): No  Physical Activity: Insufficiently Active (01/22/2021)   Exercise Vital Sign  Days of Exercise per Week: 3 days    Minutes of Exercise per Session: 30 min  Stress: No Stress Concern Present (01/22/2021)   [place]    Feeling of Stress : Not at all  Social Connections: Moderately Integrated (01/22/2021)   Social Connection and Isolation Panel [NHANES]    Frequency of Communication with Friends and Family: More than three times a week    Frequency of Social Gatherings with Friends and Family: More than three times a week    Attends Religious Services: More than 4 times per year    Active Member of [employer] or Organizations: No    Attends Music therapist: More than 4 times per year    Marital Status: Widowed  Intimate Partner Violence:  Not At Risk (05/28/2022)   Humiliation, Afraid, Rape, and Kick questionnaire    Fear of Current or Ex-Partner: No    Emotionally Abused: No    Physically Abused: No    Sexually Abused: No    FAMILY HISTORY:  Family History  Problem Relation Age of Onset   Breast cancer Mother        dx in her 15s   Heart disease Mother    Thyroid disease Mother    Lung cancer Father        dx in his 95s   Thyroid disease Maternal Aunt    Thyroid nodules Maternal Grandmother 35       goiter   Thyroid disease Maternal Grandmother    Heart disease Maternal Grandfather    Heart disease Paternal Grandmother    Heart disease Paternal Grandfather    Thyroid disease Daughter    Thyroid disease Daughter    Cancer Maternal Uncle        NOS   Cancer Paternal Uncle        NOS   Breast cancer Cousin        pat first cousin died in her 52s;     CURRENT MEDICATIONS:  Current Outpatient Medications  Medication Sig Dispense Refill   Acetaminophen (TYLENOL PO) Take 1 tablet by mouth as needed.     albuterol (VENTOLIN HFA) 108 (90 Base) MCG/ACT inhaler Inhale into the lungs.     benazepril (LOTENSIN) 20 MG tablet Take by mouth.     clobetasol cream (TEMOVATE) 5.40 % Apply 1 Application topically 2 (two) times daily. Apply to hands and feet 30 g 3   gabapentin (NEURONTIN) 300 MG capsule Take 1 capsule by mouth at bedtime.     levothyroxine (SYNTHROID) 75 MCG tablet Take 100 mcg by mouth daily before breakfast.     lidocaine (XYLOCAINE) 5 % ointment Apply 1 Application topically 2 (two) times daily. Apply to hands and feet 35.44 g 2   LORazepam (ATIVAN) 0.5 MG tablet Take 0.5 mg by mouth daily as needed.     losartan (COZAAR) 50 MG tablet Take 50 mg by mouth daily.     magnesium oxide (MAG-OX) 400 MG tablet Take 1 tablet (400 mg total) by mouth 2 (two) times daily. 60 tablet 6   Melatonin 5 MG TABS Take 10 mg by mouth. As needed for sleep     Omega-3 Fatty Acids (FISH OIL PO) Take by mouth. (Patient not  taking: Reported on 05/28/2022)     Pembrolizumab (KEYTRUDA IV) Inject into the vein every 21 ( twenty-one) days.     prochlorperazine (COMPAZINE) 10 MG tablet Take by mouth.  Tiotropium Bromide-Olodaterol (STIOLTO RESPIMAT) 2.5-2.5 MCG/ACT AERS Inhale 1-2 puffs into the lungs as directed.     No current facility-administered medications for this visit.    ALLERGIES:  Allergies  Allergen Reactions   Exforge [Amlodipine Besylate-Valsartan] Nausea And Vomiting   Tape Other (See Comments)    Redness and Irriatation   Cheese     Hard cheese   Levofloxacin In D5w Hives   Strawberry Extract Diarrhea    Seeds,nuts, lettuce, grapes   Tramadol Hcl Nausea And Vomiting   Yeast-Related Products Hives    Mold on bread    PHYSICAL EXAM:  Performance status (ECOG): 1 - Symptomatic but completely ambulatory  There were no vitals filed for this visit.  Wt Readings from Last 3 Encounters:  05/26/22 179 lb 8 oz (81.4 kg)  05/26/22 179 lb (81.2 kg)  05/13/22 185 lb (83.9 kg)   Physical Exam Vitals reviewed.  Constitutional:      Appearance: Normal appearance.  Cardiovascular:     Rate and Rhythm: Normal rate and regular rhythm.     Pulses: Normal pulses.     Heart sounds: Normal heart sounds.  Pulmonary:     Effort: Pulmonary effort is normal.     Breath sounds: Normal breath sounds.  Skin:    Comments: Skin peeling on L great toe  Neurological:     General: No focal deficit present.     Mental Status: She is alert and oriented to person, place, and time.  Psychiatric:        Mood and Affect: Mood normal.        Behavior: Behavior normal.    LABORATORY DATA:  I have reviewed the labs as listed.     Latest Ref Rng & Units 05/11/2022    9:00 AM 04/20/2022    9:46 AM 03/26/2022    8:36 AM  CBC  WBC 4.0 - 10.5 K/uL 3.2  2.8  3.4   Hemoglobin 12.0 - 15.0 g/dL 11.2  10.8  11.1   Hematocrit 36.0 - 46.0 % 32.4  31.7  32.3   Platelets 150 - 400 K/uL 195  171  166       Latest  Ref Rng & Units 05/11/2022    9:00 AM 04/20/2022    9:46 AM 03/26/2022    8:36 AM  CMP  Glucose 70 - 99 mg/dL 97  98  98   BUN 8 - 23 mg/dL _0 Creatinine 0.44 - 1.00 mg/dL 0.80  0.74  0.79   Sodium 135 - 145 mmol/L 137  139  135   Potassium 3.5 - 5.1 mmol/L 4.1  3.8  3.7   Chloride 98 - 111 mmol/L 108  108  105   CO2 22 - 32 mmol/L _1 Calcium 8.9 - 10.3 mg/dL 8.9  9.0  8.7   Total Protein 6.5 - 8.1 g/dL 7.4  7.0  6.9   Total Bilirubin 0.3 - 1.2 mg/dL 0.5  0.7  0.7   Alkaline Phos 38 - 126 U/L 75  75  75   AST 15 - 41 U/L _2 ALT 0 - 44 U/L _3 DIAGNOSTIC IMAGING:  I have independently reviewed the scans and discussed with the patient. MR Brain W Wo Contrast  Result Date: 06/05/2022 CLINICAL DATA:  History of breast cancer, brain metastases, SRS treatment planning EXAM: MRI HEAD  WITHOUT AND WITH CONTRAST TECHNIQUE: Multiplanar, multiecho pulse sequences of the brain and surrounding structures were obtained without and with intravenous contrast. CONTRAST:  57m MULTIHANCE GADOBENATE DIMEGLUMINE 529 MG/ML IV SOLN COMPARISON:  05/23/2022 MRI head FINDINGS: Brain: Redemonstrated enhancing lesions in the right frontal lobe, the largest of which measures up to 10 mm (series 14, image 94), previously up to 8 mm. A smaller adjacent lesion measures up to 5 mm (series 14, image 100), unchanged. Increased associated T2 hyperintense signal, likely edema. Additional punctate enhancing focus in the vermis (series 14, image 53), which is more conspicuous than on the prior exam. Possible punctate focus in the right posterior lentiform nucleus (series 14, image 86). No restricted diffusion to suggest acute or subacute infarct. No acute hemorrhage, mass effect, or midline shift. No hydrocephalus or extra-axial collection. T2 hyperintense signal in the periventricular white matter, likely the sequela of moderate chronic small vessel ischemic disease. Vascular: Normal arterial  flow voids. Normal arterial and venous enhancement. Skull and upper cervical spine: Normal marrow signal. Sinuses/Orbits: No acute finding. Status post left lens replacement. Other: Fluid in the left mastoid air cells. IMPRESSION: 1. Redemonstrated right posterior frontal lobe lesions with slight increase in the size of the larger of the lesions, now measuring up to 10 mm, with slightly increased associated edema. 2. Additional punctate enhancing foci in the vermis and right posterior lentiform nucleus, which may represent developing metastatic disease. Electronically Signed   By: AMerilyn BabaM.D.   On: 06/05/2022 15:07   NM PET Image Restage (PS) Whole Body  Result Date: 05/29/2022 CLINICAL DATA:  Subsequent treatment strategy for metastatic breast cancer. EXAM: NUCLEAR MEDICINE PET WHOLE BODY TECHNIQUE: 8.92 mCi F-18 FDG was injected intravenously. Full-ring PET imaging was performed from the head to foot after the radiotracer. CT data was obtained and used for attenuation correction and anatomic localization. Fasting blood glucose: 93 mg/dl COMPARISON:  PET-CT 10/16/2021.  Lumbar MRI 05/23/2022. FINDINGS: Mediastinal blood pool activity: SUV max 2.8 HEAD/ NECK: No hypermetabolic cervical lymph nodes are identified.Fairly symmetric activity within the lymphoid tissue of Waldeyer's ring is within physiologic limits.No suspicious activity identified within the pharyngeal mucosal space. Incidental CT findings: Small cervical lymph nodes appear unchanged. CHEST: There are no hypermetabolic mediastinal, hilar or axillary lymph nodes. No hypermetabolic pulmonary activity or suspicious nodularity. A partially calcified 1.1 cm left lower lobe nodule on image 34/7 is stable and not hypermetabolic (SUV max 0.9). Incidental CT findings: Right subclavian Port-A-Cath extends to the superior cavoatrial junction. Calcified left hilar lymph nodes. Left breast tissue expander or implant. ABDOMEN/PELVIS: There is no  hypermetabolic activity within the liver, adrenal glands, spleen or pancreas. There is no hypermetabolic nodal activity in the abdomen or pelvis. Incidental CT findings: Diffuse diverticular changes throughout the distal colon. Probable pelvic floor laxity post hysterectomy. SKELETON: As seen on MRI, there is a hypermetabolic lesion posteriorly in the T11 vertebral body consistent with a metastasis. This has an SUV max of 6.5. Hypermetabolic lesion posteriorly in the right 6th rib has an SUV max of 4.5, suspicious for metastatic disease. There is a hypermetabolic lytic lesion in the left femoral neck (SUV max 5.5). Incidental CT findings: Stable probable sebaceous cyst within the left upper abdominal wall. EXTREMITIES: As above, metastasis within the left femoral neck which places the patient at risk for pathologic fracture. There is a hypermetabolic metastasis within the left scapular glenoid (SUV max 8.5). No other abnormal activity identified in the extremities. Incidental CT findings: none IMPRESSION:  1. Multifocal osseous metastatic disease with lesions involving the posterior right 6th rib, T11 vertebral body, left scapular glenoid and left femoral neck. The latter places the patient at risk for a pathologic fracture. 2. No chest wall recurrence or non osseous metastases identified. Electronically Signed   By: Richardean Sale M.D.   On: 05/29/2022 16:46   MR HIP LEFT W WO CONTRAST  Result Date: 05/25/2022 CLINICAL DATA:  Left hip pain. History of breast cancer. EXAM: MRI OF THE LEFT HIP WITHOUT AND WITH CONTRAST TECHNIQUE: Multiplanar, multisequence MR imaging was performed both before and after administration of intravenous contrast. CONTRAST:  33m GADAVIST GADOBUTROL 1 MMOL/ML IV SOLN COMPARISON:  PET-CT 10/16/2021 FINDINGS: There is a metastatic lesion involving the left hip. This is in the intertrochanteric region medially and involves the cortex which is markedly thinned. No involvement of the lesser  trochanter. No acute pathologic fracture. There is a second small lesion involving the left ischium. This is also a cortical lesion but no pathologic fracture. Small subcortical lesion involving the greater trochanter but no fracture. Bilateral symmetric sacral lesions near the SI joints could be degenerative changes. No muscle lesions or subcutaneous lesions. No significant intrapelvic abnormalities. Diffuse sigmoid colon diverticulosis is noted. IMPRESSION: 1. Metastatic bone lesions involving the pelvis and left hip as detailed above. The hip lesion is partially destroying the anterior cortex and is likely causing the patient's symptoms. No discrete pathologic fracture but certainly at risk. 2. No muscle or subcutaneous or intrapelvic metastatic lesions. Electronically Signed   By: PMarijo SanesM.D.   On: 05/25/2022 14:39   MR Brain W Wo Contrast  Result Date: 05/25/2022 CLINICAL DATA:  Headache, new or worsening. Severe frontal headache. History of breast cancer. EXAM: MRI HEAD WITHOUT AND WITH CONTRAST TECHNIQUE: Multiplanar, multiecho pulse sequences of the brain and surrounding structures were obtained without and with intravenous contrast. CONTRAST:  882mGADAVIST GADOBUTROL 1 MMOL/ML IV SOLN COMPARISON:  CT 01/29/2022 FINDINGS: Brain: Diffusion imaging does not show any acute or subacute infarction. There is a background pattern brain volume loss with chronic small-vessel ischemic changes of a moderate degree affecting the cerebral hemispheric white matter. There are 2 enhancing mass lesions in the right hemisphere within what I think is probably the precentral gyrus. These are consistent with metastatic masses. The larger mass measures 9.4 mm in diameter and the smaller mass measures 5.5 mm in diameter. No evidence of hemorrhage or mass effect. No hydrocephalus or extra-axial collection. Vascular: Major vessels at the base of the brain show flow. Skull and upper cervical spine: No evidence of calvarial  or skull base metastatic disease. Sinuses/Orbits: Clear/normal Other: None IMPRESSION: Two mass lesions at the right frontoparietal junction region, favored to be within the precentral gyrus, consistent with metastatic disease. The larger measures 9.4 mm in diameter and the smaller measures 5.5 mm in diameter. No significant mass effect or hemorrhage. Moderate chronic small-vessel ischemic changes of the cerebral hemispheric white matter. Electronically Signed   By: MaNelson Chimes.D.   On: 05/25/2022 14:31   MR Lumbar Spine W Wo Contrast  Result Date: 05/25/2022 CLINICAL DATA:  Low back pain. History of breast cancer. EXAM: MRI LUMBAR SPINE WITHOUT AND WITH CONTRAST TECHNIQUE: Multiplanar and multiecho pulse sequences of the lumbar spine were obtained without and with intravenous contrast. CONTRAST:  33m89mADAVIST GADOBUTROL 1 MMOL/ML IV SOLN COMPARISON:  PET-CT 10/17/2021 and prior MRI lumbar spine 02/20/2021 FINDINGS: Segmentation: There are five lumbar type vertebral bodies. The last full  intervertebral disc space is labeled L5-S1. Alignment:  Normal Vertebrae: There is a 15 mm lesion in the T11 vertebral body posteriorly which demonstrates low T1 and high T2 signal intensity and subsequent contrast enhancement and is consistent with a metastatic lesion. No canal involvement. Small lesion also involving the left S1 pedicle area. No canal stenosis. Benign T12 hemangioma and small Schmorl's nodes. Conus medullaris and cauda equina: Conus extends to the T12-L1 level. Conus and cauda equina appear normal. Paraspinal and other soft tissues: No significant paraspinal or retroperitoneal findings. Disc levels: T12-L1: No significant findings. L1-2: Mild annular bulge and mild facet disease but no disc protrusions, spinal or foraminal stenosis. L2-3: Mild annular bulge and mild facet disease but spinal or foraminal stenosis. L3-4: Bulging annulus, osteophytic ridging and moderate facet disease contributing to early  spinal and bilateral lateral recess stenosis. No foraminal stenosis. L4-5: Bulging annulus and small focal central disc protrusion along with facet disease and ligamentum flavum thickening all contributing to mild spinal and bilateral lateral recess stenosis. No foraminal stenosis. L5-S1: Generous spinal canal. Moderate facet disease but no disc protrusions or foraminal stenosis. IMPRESSION: 1. 15 mm lesion in the T11 vertebral body posteriorly consistent with a metastatic lesion. No canal involvement. 2. Small lesion involving the left S1 pedicle area. 3. Multilevel disc disease and facet disease with early spinal and bilateral lateral recess stenosis at L3-4 and mild spinal and bilateral lateral recess stenosis at L4-5. Electronically Signed   By: Marijo Sanes M.D.   On: 05/25/2022 14:26     ASSESSMENT:  1.  T3N3c (stage IIIc) triple negative invasive lobular carcinoma of the left breast: - She felt lump in her left breast for more than 6 months, but thought it was secondary to fibrocystic disease which she had all her life.  When she started having pain, she reached out to Dr. Sherrie Sport. - Ultrasound-guided left breast and left axillary lymph node biopsy on 01/15/2021 - Pathology consistent with invasive lobular carcinoma, E-cadherin negative.  ER/PR/HER2 negative.  HER2 2+ by IHC, negative by FISH.  Ki-67 is 5%.  Lymph node core biopsy was consistent with metastatic carcinoma.  Grade 2. - PET scan on 02/03/2021 showed involvement of left supraclavicular, subpectoral, axillary lymph nodes along with the breast mass.  10 mm left lung nodule which is hypometabolic.  Spinal cord lesion at T12 level with a strong uptake. - MRI of the lumbar spine with and without contrast on 02/20/2021 showed no mass or abnormal enhancement within the canal at the T12 level to correspond to the site of PET scan positive.  No marrow replacing bone lesion. - 12 weeks of carboplatin and paclitaxel weekly and every 3 weeks  pembrolizumab followed by 4 cycles of dose dense AC with pembrolizumab ([id number]) from 02/27/2021 through 10/01/2021 - PET scan on 10/16/2021: Reduction in metabolic activity in the size of the left axillary lymph node and no evidence of breast hypermetabolism on PET scan. - Left mastectomy and lymph node excision by Dr. Ninfa Linden on 11/20/2021 - Pathology: 6.2 cm invasive lobular carcinoma, grade 2, margins negative.  19/21 lymph nodes involved with macrometastasis.  ypT3, YPN3A - Adjuvant pembrolizumab started on 01/01/2022. -CREATE-X capecitabine 1500 mg twice daily 2 weeks on/1 week off started on 01/01/2022.  Last dose of pembrolizumab on 05/11/2022 - Germline mutation testing ([facility]) negative - [facility] (06/04/2022): T p53, K-ras V14 I, CDKN2A.  MSI-high not detected.     2.  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  She is non-smoker. - Mother died of breast cancer.  Maternal grandmother died very young in her 42s, sister has fibrocystic disease.  Father died of lung cancer and was a smoker.   PLAN:  1.  Metastatic TNBC to the bones: - PET scan (05/28/2022): Multifocal bone lesions involving T11 vertebral body, right posterior sixth rib, left femoral neck, left scapular glenoid.  No visible metastatic disease. - She is being planned for radiation to the left hip region. - I would also recommend radiation to the left scapular lesion as it is causing pain. - We will discontinue pembrolizumab and Xeloda. - Recommend second line treatment with trastuzumab on day 1, day 8 every 21 days. - Recommend NGS testing. - We will make a referral to Dr. Wetzel Bjornstad at [facility] for second opinion.  2.  Brain lesions: - MRI brain (06/05/2022): Right frontal lobe metastatic lesion measures 1 cm.  Small adjacent lesion measures 5 mm.  Punctate enhancing focus in the vermis.  Possible punctate focus in the right posterior lentiform nucleus. - She is being  planned for [facility]. - Headaches are fairly controlled with Tylenol.  3.  Peripheral neuropathy: - Peripheral neuropathy in the toes is improving.  4.  Hypomagnesemia: - Continue magnesium supplements twice daily.  5.  Grade 2 HFSR of the feet: - This has completely resolved since she has been off of Xeloda.   Orders placed this encounter:  No orders of the defined types were placed in this encounter.     Derek Jack, MD [place] [phone]

## 2022-06-09 ENCOUNTER — Telehealth: Payer: Self-pay | Admitting: Radiation Oncology

## 2022-06-09 ENCOUNTER — Ambulatory Visit
Admission: RE | Admit: 2022-06-09 | Discharge: 2022-06-09 | Disposition: A | Discharge status: Home / Self Care | Payer: Medicare Other | Source: Ambulatory Visit | Attending: Radiation Oncology | Admitting: Radiation Oncology

## 2022-06-09 ENCOUNTER — Ambulatory Visit: Payer: Medicare Other | Admitting: Radiation Oncology

## 2022-06-09 ENCOUNTER — Other Ambulatory Visit: Payer: Self-pay

## 2022-06-09 VITALS — BP 153/89 | HR 69 | Temp 98.7°F | Wt 178.0 lb

## 2022-06-09 DIAGNOSIS — C7931 Secondary malignant neoplasm of brain: Secondary | ICD-10-CM

## 2022-06-09 DIAGNOSIS — C50912 Malignant neoplasm of unspecified site of left female breast: Secondary | ICD-10-CM | POA: Diagnosis not present

## 2022-06-09 DIAGNOSIS — Z510 Encounter for antineoplastic radiation therapy: Secondary | ICD-10-CM | POA: Insufficient documentation

## 2022-06-09 DIAGNOSIS — C7951 Secondary malignant neoplasm of bone: Secondary | ICD-10-CM | POA: Diagnosis not present

## 2022-06-09 MED ORDER — HEPARIN SOD (PORK) LOCK FLUSH 100 UNIT/ML IV SOLN
500.0000 [IU] | Freq: Once | INTRAVENOUS | Status: AC
  Administered 2022-06-09: 500 [IU] via INTRAVENOUS

## 2022-06-09 MED ORDER — SODIUM CHLORIDE 0.9% FLUSH
10.0000 mL | Freq: Once | INTRAVENOUS | Status: AC
  Administered 2022-06-09: 10 mL via INTRAVENOUS

## 2022-06-09 NOTE — Progress Notes (Signed)
Has armband been applied?  Yes.    Does patient have an allergy to IV contrast dye?: No.   Has patient ever received premedication for IV contrast dye?: No.   Does patient take metformin?: No.  If patient does take metformin when was the last dose: None  Date of lab work: June 11, 2022 BUN: 22 CR: 0.80 GFR: >60  RT chest port access  Has IV site been added to flowsheet?  Yes.    BP (!) 153/89 (Patient Position: Sitting) Comment: nurse notified  Pulse 69   Temp 98.7 F (37.1 C) (Temporal)   Wt 178 lb (80.7 kg)   SpO2 100%   BMI 29.62 kg/m

## 2022-06-09 NOTE — Telephone Encounter (Signed)
I saw the patient today and we discussed recommendations for 10 fractions of palliative radiotherapy to the left femur and left scapular glenoid in addition for plans for single fraction SRS to two lesions. Written consent is obtained and placed in the chart, a copy was provided to the patient. She will simulate today. She requested that I call her son Legrand Como who lives out of state. Her children are encouraging her to have a second opinion at [facility] and are hesitant for her to proceed with radiation.   I was also able to speak with her son Margy Sumler who also lives out of state and had some concerns about her plans for radiation. We discussed these and I encouraged him to contact me if he had further questions about therapy.      Carola Rhine, PAC

## 2022-06-10 ENCOUNTER — Other Ambulatory Visit: Payer: Self-pay | Admitting: Radiation Therapy

## 2022-06-10 ENCOUNTER — Other Ambulatory Visit: Payer: Self-pay

## 2022-06-10 DIAGNOSIS — C7931 Secondary malignant neoplasm of brain: Secondary | ICD-10-CM

## 2022-06-11 ENCOUNTER — Other Ambulatory Visit: Payer: Self-pay

## 2022-06-11 ENCOUNTER — Telehealth: Payer: Self-pay | Admitting: Radiation Oncology

## 2022-06-11 DIAGNOSIS — Z6829 Body mass index (BMI) 29.0-29.9, adult: Secondary | ICD-10-CM | POA: Diagnosis not present

## 2022-06-11 DIAGNOSIS — C7931 Secondary malignant neoplasm of brain: Secondary | ICD-10-CM | POA: Diagnosis not present

## 2022-06-11 DIAGNOSIS — Z510 Encounter for antineoplastic radiation therapy: Secondary | ICD-10-CM | POA: Diagnosis not present

## 2022-06-11 DIAGNOSIS — C50912 Malignant neoplasm of unspecified site of left female breast: Secondary | ICD-10-CM | POA: Diagnosis not present

## 2022-06-11 DIAGNOSIS — C7951 Secondary malignant neoplasm of bone: Secondary | ICD-10-CM | POA: Diagnosis not present

## 2022-06-11 NOTE — Telephone Encounter (Signed)
I called the patient to communicate that Dr. Lisbeth Renshaw would like to treat her T11 metastasis after reviewing her diagnostic and treatment planning imaging for her left shoulder region and left femur. We can add this site to her current treatment plan, but she will need to sign updated consent for this at her next visit when she comes on Tuesday for [place]. She is in agreement with this plan.

## 2022-06-12 DIAGNOSIS — C7931 Secondary malignant neoplasm of brain: Secondary | ICD-10-CM | POA: Diagnosis not present

## 2022-06-12 DIAGNOSIS — Z510 Encounter for antineoplastic radiation therapy: Secondary | ICD-10-CM | POA: Diagnosis not present

## 2022-06-12 DIAGNOSIS — C50912 Malignant neoplasm of unspecified site of left female breast: Secondary | ICD-10-CM | POA: Diagnosis not present

## 2022-06-14 ENCOUNTER — Other Ambulatory Visit: Payer: Self-pay

## 2022-06-16 ENCOUNTER — Ambulatory Visit
Admission: RE | Admit: 2022-06-16 | Discharge: 2022-06-16 | Disposition: A | Discharge status: Home / Self Care | Payer: Medicare Other | Source: Ambulatory Visit | Attending: Radiation Oncology | Admitting: Radiation Oncology

## 2022-06-16 ENCOUNTER — Other Ambulatory Visit (HOSPITAL_COMMUNITY): Payer: Self-pay

## 2022-06-16 ENCOUNTER — Other Ambulatory Visit: Payer: Self-pay

## 2022-06-16 ENCOUNTER — Encounter: Payer: Self-pay | Admitting: Radiation Oncology

## 2022-06-16 DIAGNOSIS — C7931 Secondary malignant neoplasm of brain: Secondary | ICD-10-CM | POA: Insufficient documentation

## 2022-06-16 DIAGNOSIS — Z510 Encounter for antineoplastic radiation therapy: Secondary | ICD-10-CM | POA: Insufficient documentation

## 2022-06-16 LAB — RAD ONC ARIA SESSION SUMMARY
Course Elapsed Days: 0
Plan Fractions Treated to Date: 1
Plan Fractions Treated to Date: 1
Plan Prescribed Dose Per Fraction: 20 Gy
Plan Prescribed Dose Per Fraction: 20 Gy
Plan Total Fractions Prescribed: 1
Plan Total Fractions Prescribed: 1
Plan Total Prescribed Dose: 20 Gy
Plan Total Prescribed Dose: 20 Gy
Reference Point Dosage Given to Date: 20 Gy
Reference Point Dosage Given to Date: 20 Gy
Reference Point Session Dosage Given: 20 Gy
Reference Point Session Dosage Given: 20 Gy
Session Number: 1

## 2022-06-16 NOTE — Addendum Note (Signed)
Encounter addended by: Kyung Rudd, MD on: 06/16/2022 1:54 PM  Actions taken: Clinical Note Signed

## 2022-06-16 NOTE — Op Note (Signed)
  Name: CAROLAN AVEDISIAN  MRN: [id number]  Date: 06/16/2022   DOB: 27-Feb-1951  Stereotactic Radiosurgery Operative Note  PRE-OPERATIVE DIAGNOSIS:  Multiple Brain Metastases  POST-OPERATIVE DIAGNOSIS:  Multiple Brain Metastases  PROCEDURE:  Stereotactic Radiosurgery  SURGEON:  Charlie Pitter, MD  NARRATIVE: The patient underwent a radiation treatment planning session in the radiation oncology simulation suite under the care of the radiation oncology physician and physicist.  I participated closely in the radiation treatment planning afterwards. The patient underwent planning CT which was fused to 3T high resolution MRI with 1 mm axial slices.  These images were fused on the planning system.  We contoured the gross target volumes and subsequently expanded this to yield the Planning Target Volume. I actively participated in the planning process.  I helped to define and review the target contours and also the contours of the optic pathway, eyes, brainstem and selected nearby organs at risk.  All the dose constraints for critical structures were reviewed and compared to AAPM Task Group 101.  The prescription dose conformity was reviewed.  I approved the plan electronically.    Accordingly, Charlynne Pander was brought to the TrueBeam stereotactic radiation treatment linac and placed in the custom immobilization mask.  The patient was aligned according to the IR fiducial markers with BrainLab Exactrac, then orthogonal x-rays were used in ExacTrac with the 6DOF robotic table and the shifts were made to align the patient  Charlynne Pander received stereotactic radiosurgery uneventfully.    Lesions treated:  4   Complex lesions treated:  1 (>3.5 cm, <11m of optic path, or within the brainstem)   The detailed description of the procedure is recorded in the radiation oncology procedure note.  I was present for the duration of the procedure.  DISPOSITION:  Following delivery, the patient was transported  to nursing in stable condition and monitored for possible acute effects to be discharged to home in stable condition with follow-up in one month.  HCharlie Pitter MD 06/16/2022 11:17 AM

## 2022-06-16 NOTE — Progress Notes (Addendum)
Patient rested with [place] for 15 minutes following her SRS treatment.  Patient denies headache, dizziness, nausea, diplopia or ringing in the ears. Denies fatigue. Patient without complaints. Understands to avoid strenuous activity for the next 24 hours and call [phone] with needs.  Patient spoke with the PA and understands next steps in her treatment plan.  BP (!) 142/66 (BP Location: Right Arm, Patient Position: Sitting, Cuff Size: Large)   Pulse 62   Temp 98.2 F (36.8 C)   Resp 20   SpO2 100%    Andie Mortimer M. Leonie Green, BSN

## 2022-06-17 ENCOUNTER — Ambulatory Visit: Payer: Medicare Other | Admitting: Radiation Oncology

## 2022-06-17 ENCOUNTER — Ambulatory Visit: Admission: RE | Admit: 2022-06-17 | Payer: Medicare Other | Source: Ambulatory Visit | Admitting: Radiation Oncology

## 2022-06-18 ENCOUNTER — Other Ambulatory Visit: Payer: Self-pay

## 2022-06-18 ENCOUNTER — Ambulatory Visit: Payer: Medicare Other

## 2022-06-18 ENCOUNTER — Ambulatory Visit
Admission: RE | Admit: 2022-06-18 | Discharge: 2022-06-18 | Disposition: A | Discharge status: Home / Self Care | Payer: Medicare Other | Source: Ambulatory Visit | Attending: Radiation Oncology | Admitting: Radiation Oncology

## 2022-06-18 DIAGNOSIS — C7931 Secondary malignant neoplasm of brain: Secondary | ICD-10-CM | POA: Diagnosis not present

## 2022-06-18 DIAGNOSIS — Z510 Encounter for antineoplastic radiation therapy: Secondary | ICD-10-CM | POA: Diagnosis not present

## 2022-06-18 LAB — RAD ONC ARIA SESSION SUMMARY
Course Elapsed Days: 2
Plan Fractions Treated to Date: 1
Plan Fractions Treated to Date: 1
Plan Fractions Treated to Date: 1
Plan Prescribed Dose Per Fraction: 2.5 Gy
Plan Prescribed Dose Per Fraction: 2.5 Gy
Plan Prescribed Dose Per Fraction: 2.5 Gy
Plan Total Fractions Prescribed: 15
Plan Total Fractions Prescribed: 15
Plan Total Fractions Prescribed: 15
Plan Total Prescribed Dose: 37.5 Gy
Plan Total Prescribed Dose: 37.5 Gy
Plan Total Prescribed Dose: 37.5 Gy
Reference Point Dosage Given to Date: 2.5 Gy
Reference Point Dosage Given to Date: 2.5 Gy
Reference Point Dosage Given to Date: 2.5 Gy
Reference Point Session Dosage Given: 2.5 Gy
Reference Point Session Dosage Given: 2.5 Gy
Reference Point Session Dosage Given: 2.5 Gy
Session Number: 2

## 2022-06-19 ENCOUNTER — Ambulatory Visit
Admission: RE | Admit: 2022-06-19 | Discharge: 2022-06-19 | Disposition: A | Discharge status: Home / Self Care | Payer: Medicare Other | Source: Ambulatory Visit | Attending: Radiation Oncology | Admitting: Radiation Oncology

## 2022-06-19 ENCOUNTER — Ambulatory Visit: Payer: Medicare Other

## 2022-06-19 ENCOUNTER — Other Ambulatory Visit: Payer: Self-pay

## 2022-06-19 DIAGNOSIS — Z510 Encounter for antineoplastic radiation therapy: Secondary | ICD-10-CM | POA: Diagnosis not present

## 2022-06-19 DIAGNOSIS — C7931 Secondary malignant neoplasm of brain: Secondary | ICD-10-CM | POA: Diagnosis not present

## 2022-06-19 LAB — RAD ONC ARIA SESSION SUMMARY
Course Elapsed Days: 3
Plan Fractions Treated to Date: 2
Plan Fractions Treated to Date: 2
Plan Fractions Treated to Date: 2
Plan Prescribed Dose Per Fraction: 2.5 Gy
Plan Prescribed Dose Per Fraction: 2.5 Gy
Plan Prescribed Dose Per Fraction: 2.5 Gy
Plan Total Fractions Prescribed: 15
Plan Total Fractions Prescribed: 15
Plan Total Fractions Prescribed: 15
Plan Total Prescribed Dose: 37.5 Gy
Plan Total Prescribed Dose: 37.5 Gy
Plan Total Prescribed Dose: 37.5 Gy
Reference Point Dosage Given to Date: 5 Gy
Reference Point Dosage Given to Date: 5 Gy
Reference Point Dosage Given to Date: 5 Gy
Reference Point Session Dosage Given: 2.5 Gy
Reference Point Session Dosage Given: 2.5 Gy
Reference Point Session Dosage Given: 2.5 Gy
Session Number: 3

## 2022-06-22 ENCOUNTER — Telehealth: Payer: Self-pay | Admitting: Radiation Oncology

## 2022-06-22 ENCOUNTER — Ambulatory Visit: Payer: Medicare Other

## 2022-06-22 ENCOUNTER — Encounter: Payer: Self-pay | Admitting: Radiation Oncology

## 2022-06-22 ENCOUNTER — Ambulatory Visit
Admission: RE | Admit: 2022-06-22 | Discharge: 2022-06-22 | Disposition: A | Discharge status: Home / Self Care | Payer: Medicare Other | Source: Ambulatory Visit | Attending: Radiation Oncology | Admitting: Radiation Oncology

## 2022-06-22 ENCOUNTER — Other Ambulatory Visit: Payer: Self-pay

## 2022-06-22 DIAGNOSIS — C7931 Secondary malignant neoplasm of brain: Secondary | ICD-10-CM | POA: Diagnosis not present

## 2022-06-22 DIAGNOSIS — Z510 Encounter for antineoplastic radiation therapy: Secondary | ICD-10-CM | POA: Diagnosis not present

## 2022-06-22 LAB — RAD ONC ARIA SESSION SUMMARY
Course Elapsed Days: 6
Plan Fractions Treated to Date: 3
Plan Fractions Treated to Date: 3
Plan Fractions Treated to Date: 3
Plan Prescribed Dose Per Fraction: 2.5 Gy
Plan Prescribed Dose Per Fraction: 2.5 Gy
Plan Prescribed Dose Per Fraction: 2.5 Gy
Plan Total Fractions Prescribed: 15
Plan Total Fractions Prescribed: 15
Plan Total Fractions Prescribed: 15
Plan Total Prescribed Dose: 37.5 Gy
Plan Total Prescribed Dose: 37.5 Gy
Plan Total Prescribed Dose: 37.5 Gy
Reference Point Dosage Given to Date: 7.5 Gy
Reference Point Dosage Given to Date: 7.5 Gy
Reference Point Dosage Given to Date: 7.5 Gy
Reference Point Session Dosage Given: 2.5 Gy
Reference Point Session Dosage Given: 2.5 Gy
Reference Point Session Dosage Given: 2.5 Gy
Session Number: 4

## 2022-06-22 NOTE — Telephone Encounter (Signed)
Called patient's daughter Almyra Free to inform her of her mother's appointment with Dr. Ninfa Linden tomorrow at 10:10 am.

## 2022-06-22 NOTE — Progress Notes (Signed)
The patient came to clinic today after treatment for her ongoing treatment of her metastatic breast cancer involving the femur scapula and T11 vertebral body.  She also had recent SRS last week to her brain.  She is complaining of concerns with her left mastectomy site.She had surgery in March 2023 and underwent a left total mastectomy with ALND.  She states that in the last month she has had increasing firmness fullness and within the last couple of weeks more discomfort and redness of the area.  She is adamant that she did not have any tissue expander or implant placed.  On her previous imaging her PET scan actually indicated that she had asymmetry and attributed this to either an expander or implant.  That area was not PET positive during that scan in September 2023.  She on exam has significant edema of the left chest wall and what feels like firmness and palpable fluctuance at behind the warmness.  I am suspicious that her pectoralis is engorged.  She states that it is quite firm but also quite painful and feels hot at night.  In the mornings when she wakes up it is really red and feels like it is on fire.  In clinic it does not appear that she has any cellulitis but she does have what looks to be capillary engorgement and bruising she denies any recent chest wall trauma or falls.  She denies any history of pectoralis muscle injury.  But she denies fevers or chills at home but does describe having hot flashes at nighttime.  I encouraged her to follow-up with Dr. Ninfa Linden to see if he feels that she needs to be treated for infection but on exam today I do not think that she has a superficial cellulitis.  With hot flashes it is uncertain if this is related to her chest wall findings or if this is related to her history of thyroid disease.

## 2022-06-23 ENCOUNTER — Other Ambulatory Visit: Payer: Self-pay

## 2022-06-23 ENCOUNTER — Ambulatory Visit: Payer: Medicare Other

## 2022-06-23 ENCOUNTER — Ambulatory Visit
Admission: RE | Admit: 2022-06-23 | Discharge: 2022-06-23 | Disposition: A | Discharge status: Home / Self Care | Payer: Medicare Other | Source: Ambulatory Visit | Attending: Radiation Oncology | Admitting: Radiation Oncology

## 2022-06-23 DIAGNOSIS — Z171 Estrogen receptor negative status [ER-]: Secondary | ICD-10-CM | POA: Diagnosis not present

## 2022-06-23 DIAGNOSIS — C773 Secondary and unspecified malignant neoplasm of axilla and upper limb lymph nodes: Secondary | ICD-10-CM | POA: Diagnosis not present

## 2022-06-23 DIAGNOSIS — C50912 Malignant neoplasm of unspecified site of left female breast: Secondary | ICD-10-CM | POA: Diagnosis not present

## 2022-06-23 DIAGNOSIS — Z510 Encounter for antineoplastic radiation therapy: Secondary | ICD-10-CM | POA: Diagnosis not present

## 2022-06-23 DIAGNOSIS — C7931 Secondary malignant neoplasm of brain: Secondary | ICD-10-CM | POA: Diagnosis not present

## 2022-06-23 DIAGNOSIS — L7622 Postprocedural hemorrhage and hematoma of skin and subcutaneous tissue following other procedure: Secondary | ICD-10-CM | POA: Diagnosis not present

## 2022-06-23 LAB — RAD ONC ARIA SESSION SUMMARY
Course Elapsed Days: 7
Plan Fractions Treated to Date: 4
Plan Fractions Treated to Date: 4
Plan Fractions Treated to Date: 4
Plan Prescribed Dose Per Fraction: 2.5 Gy
Plan Prescribed Dose Per Fraction: 2.5 Gy
Plan Prescribed Dose Per Fraction: 2.5 Gy
Plan Total Fractions Prescribed: 15
Plan Total Fractions Prescribed: 15
Plan Total Fractions Prescribed: 15
Plan Total Prescribed Dose: 37.5 Gy
Plan Total Prescribed Dose: 37.5 Gy
Plan Total Prescribed Dose: 37.5 Gy
Reference Point Dosage Given to Date: 10 Gy
Reference Point Dosage Given to Date: 10 Gy
Reference Point Dosage Given to Date: 10 Gy
Reference Point Session Dosage Given: 2.5 Gy
Reference Point Session Dosage Given: 2.5 Gy
Reference Point Session Dosage Given: 2.5 Gy
Session Number: 5

## 2022-06-24 ENCOUNTER — Other Ambulatory Visit: Payer: Self-pay

## 2022-06-24 ENCOUNTER — Ambulatory Visit: Payer: Medicare Other

## 2022-06-24 ENCOUNTER — Encounter (HOSPITAL_COMMUNITY): Payer: Self-pay

## 2022-06-24 ENCOUNTER — Ambulatory Visit
Admission: RE | Admit: 2022-06-24 | Discharge: 2022-06-24 | Disposition: A | Discharge status: Home / Self Care | Payer: Medicare Other | Source: Ambulatory Visit | Attending: Radiation Oncology | Admitting: Radiation Oncology

## 2022-06-24 DIAGNOSIS — C7951 Secondary malignant neoplasm of bone: Secondary | ICD-10-CM | POA: Diagnosis not present

## 2022-06-24 DIAGNOSIS — Z510 Encounter for antineoplastic radiation therapy: Secondary | ICD-10-CM | POA: Diagnosis not present

## 2022-06-24 DIAGNOSIS — C7931 Secondary malignant neoplasm of brain: Secondary | ICD-10-CM | POA: Diagnosis not present

## 2022-06-24 DIAGNOSIS — C50912 Malignant neoplasm of unspecified site of left female breast: Secondary | ICD-10-CM | POA: Diagnosis not present

## 2022-06-24 LAB — RAD ONC ARIA SESSION SUMMARY
Course Elapsed Days: 8
Plan Fractions Treated to Date: 5
Plan Fractions Treated to Date: 5
Plan Fractions Treated to Date: 5
Plan Prescribed Dose Per Fraction: 2.5 Gy
Plan Prescribed Dose Per Fraction: 2.5 Gy
Plan Prescribed Dose Per Fraction: 2.5 Gy
Plan Total Fractions Prescribed: 15
Plan Total Fractions Prescribed: 15
Plan Total Fractions Prescribed: 15
Plan Total Prescribed Dose: 37.5 Gy
Plan Total Prescribed Dose: 37.5 Gy
Plan Total Prescribed Dose: 37.5 Gy
Reference Point Dosage Given to Date: 12.5 Gy
Reference Point Dosage Given to Date: 12.5 Gy
Reference Point Dosage Given to Date: 12.5 Gy
Reference Point Session Dosage Given: 2.5 Gy
Reference Point Session Dosage Given: 2.5 Gy
Reference Point Session Dosage Given: 2.5 Gy
Session Number: 6

## 2022-06-25 ENCOUNTER — Ambulatory Visit
Admission: RE | Admit: 2022-06-25 | Discharge: 2022-06-25 | Disposition: A | Discharge status: Home / Self Care | Payer: Medicare Other | Source: Ambulatory Visit | Attending: Radiation Oncology | Admitting: Radiation Oncology

## 2022-06-25 ENCOUNTER — Other Ambulatory Visit: Payer: Self-pay

## 2022-06-25 ENCOUNTER — Ambulatory Visit: Payer: Medicare Other

## 2022-06-25 DIAGNOSIS — C7931 Secondary malignant neoplasm of brain: Secondary | ICD-10-CM | POA: Diagnosis not present

## 2022-06-25 DIAGNOSIS — Z510 Encounter for antineoplastic radiation therapy: Secondary | ICD-10-CM | POA: Diagnosis not present

## 2022-06-25 LAB — RAD ONC ARIA SESSION SUMMARY
Course Elapsed Days: 9
Plan Fractions Treated to Date: 6
Plan Fractions Treated to Date: 6
Plan Fractions Treated to Date: 6
Plan Prescribed Dose Per Fraction: 2.5 Gy
Plan Prescribed Dose Per Fraction: 2.5 Gy
Plan Prescribed Dose Per Fraction: 2.5 Gy
Plan Total Fractions Prescribed: 15
Plan Total Fractions Prescribed: 15
Plan Total Fractions Prescribed: 15
Plan Total Prescribed Dose: 37.5 Gy
Plan Total Prescribed Dose: 37.5 Gy
Plan Total Prescribed Dose: 37.5 Gy
Reference Point Dosage Given to Date: 15 Gy
Reference Point Dosage Given to Date: 15 Gy
Reference Point Dosage Given to Date: 15 Gy
Reference Point Session Dosage Given: 2.5 Gy
Reference Point Session Dosage Given: 2.5 Gy
Reference Point Session Dosage Given: 2.5 Gy
Session Number: 7

## 2022-06-26 ENCOUNTER — Ambulatory Visit: Payer: Medicare Other

## 2022-06-26 ENCOUNTER — Other Ambulatory Visit: Payer: Self-pay

## 2022-06-26 ENCOUNTER — Ambulatory Visit
Admission: RE | Admit: 2022-06-26 | Discharge: 2022-06-26 | Disposition: A | Discharge status: Home / Self Care | Payer: Medicare Other | Source: Ambulatory Visit | Attending: Radiation Oncology | Admitting: Radiation Oncology

## 2022-06-26 DIAGNOSIS — C7931 Secondary malignant neoplasm of brain: Secondary | ICD-10-CM | POA: Diagnosis not present

## 2022-06-26 DIAGNOSIS — Z510 Encounter for antineoplastic radiation therapy: Secondary | ICD-10-CM | POA: Diagnosis not present

## 2022-06-26 LAB — RAD ONC ARIA SESSION SUMMARY
Course Elapsed Days: 10
Plan Fractions Treated to Date: 7
Plan Fractions Treated to Date: 7
Plan Fractions Treated to Date: 7
Plan Prescribed Dose Per Fraction: 2.5 Gy
Plan Prescribed Dose Per Fraction: 2.5 Gy
Plan Prescribed Dose Per Fraction: 2.5 Gy
Plan Total Fractions Prescribed: 15
Plan Total Fractions Prescribed: 15
Plan Total Fractions Prescribed: 15
Plan Total Prescribed Dose: 37.5 Gy
Plan Total Prescribed Dose: 37.5 Gy
Plan Total Prescribed Dose: 37.5 Gy
Reference Point Dosage Given to Date: 17.5 Gy
Reference Point Dosage Given to Date: 17.5 Gy
Reference Point Dosage Given to Date: 17.5 Gy
Reference Point Session Dosage Given: 2.5 Gy
Reference Point Session Dosage Given: 2.5 Gy
Reference Point Session Dosage Given: 2.5 Gy
Session Number: 8

## 2022-06-29 ENCOUNTER — Ambulatory Visit: Payer: Medicare Other

## 2022-06-29 ENCOUNTER — Other Ambulatory Visit: Payer: Self-pay

## 2022-06-29 ENCOUNTER — Ambulatory Visit
Admission: RE | Admit: 2022-06-29 | Discharge: 2022-06-29 | Disposition: A | Discharge status: Home / Self Care | Payer: Medicare Other | Source: Ambulatory Visit | Attending: Radiation Oncology | Admitting: Radiation Oncology

## 2022-06-29 DIAGNOSIS — Z510 Encounter for antineoplastic radiation therapy: Secondary | ICD-10-CM | POA: Diagnosis not present

## 2022-06-29 DIAGNOSIS — C7931 Secondary malignant neoplasm of brain: Secondary | ICD-10-CM | POA: Diagnosis not present

## 2022-06-29 LAB — RAD ONC ARIA SESSION SUMMARY
Course Elapsed Days: 13
Plan Fractions Treated to Date: 8
Plan Fractions Treated to Date: 8
Plan Fractions Treated to Date: 8
Plan Prescribed Dose Per Fraction: 2.5 Gy
Plan Prescribed Dose Per Fraction: 2.5 Gy
Plan Prescribed Dose Per Fraction: 2.5 Gy
Plan Total Fractions Prescribed: 15
Plan Total Fractions Prescribed: 15
Plan Total Fractions Prescribed: 15
Plan Total Prescribed Dose: 37.5 Gy
Plan Total Prescribed Dose: 37.5 Gy
Plan Total Prescribed Dose: 37.5 Gy
Reference Point Dosage Given to Date: 20 Gy
Reference Point Dosage Given to Date: 20 Gy
Reference Point Dosage Given to Date: 20 Gy
Reference Point Session Dosage Given: 2.5 Gy
Reference Point Session Dosage Given: 2.5 Gy
Reference Point Session Dosage Given: 2.5 Gy
Session Number: 9

## 2022-06-30 ENCOUNTER — Telehealth: Payer: Self-pay | Admitting: *Deleted

## 2022-06-30 ENCOUNTER — Ambulatory Visit
Admission: RE | Admit: 2022-06-30 | Discharge: 2022-06-30 | Disposition: A | Discharge status: Home / Self Care | Payer: Medicare Other | Source: Ambulatory Visit | Attending: Radiation Oncology | Admitting: Radiation Oncology

## 2022-06-30 ENCOUNTER — Ambulatory Visit: Payer: Medicare Other

## 2022-06-30 ENCOUNTER — Encounter: Payer: Self-pay | Admitting: *Deleted

## 2022-06-30 ENCOUNTER — Other Ambulatory Visit: Payer: Self-pay

## 2022-06-30 DIAGNOSIS — Z510 Encounter for antineoplastic radiation therapy: Secondary | ICD-10-CM | POA: Diagnosis not present

## 2022-06-30 DIAGNOSIS — C7931 Secondary malignant neoplasm of brain: Secondary | ICD-10-CM | POA: Diagnosis not present

## 2022-06-30 LAB — RAD ONC ARIA SESSION SUMMARY
Course Elapsed Days: 14
Plan Fractions Treated to Date: 9
Plan Fractions Treated to Date: 9
Plan Fractions Treated to Date: 9
Plan Prescribed Dose Per Fraction: 2.5 Gy
Plan Prescribed Dose Per Fraction: 2.5 Gy
Plan Prescribed Dose Per Fraction: 2.5 Gy
Plan Total Fractions Prescribed: 15
Plan Total Fractions Prescribed: 15
Plan Total Fractions Prescribed: 15
Plan Total Prescribed Dose: 37.5 Gy
Plan Total Prescribed Dose: 37.5 Gy
Plan Total Prescribed Dose: 37.5 Gy
Reference Point Dosage Given to Date: 22.5 Gy
Reference Point Dosage Given to Date: 22.5 Gy
Reference Point Dosage Given to Date: 22.5 Gy
Reference Point Session Dosage Given: 2.5 Gy
Reference Point Session Dosage Given: 2.5 Gy
Reference Point Session Dosage Given: 2.5 Gy
Session Number: 10

## 2022-06-30 NOTE — Telephone Encounter (Signed)
Spoke with the patient to let her know that we recommend her to use her tramadol at night since it makes her sleepy and to alternate tylenol and ibuprofen during the day.  She is in agreement of this plan.  Gloriajean Dell. Leonie Green, BSN

## 2022-06-30 NOTE — Patient Outreach (Signed)
  Care Coordination   Initial Visit Note   06/30/2022 Name: Denise [name] MRN: [id number] DOB: 27-Jun-1951  Denise [name] is a 71 y.o. year old female who sees Hasanaj, [name], MD for primary care. I spoke with  Charlynne Pander by phone today.  What matters to the patients health and wellness today?  Diagnosed with metastatic cancer, receiving daily (M-F) radiation treatments causing intense back pain.  Can't take pills due to GI upset, looking for alternatives and will discuss with oncology on Friday.  Denies any urgent concerns, encouraged to contact this care manager with questions.      Goals Addressed             This Visit's Progress    Pain management related to radiation treatments       Care Coordination Interventions: Reviewed provider established plan for pain management Discussed importance of adherence to all scheduled medical appointments Counseled on the importance of reporting any/all new or changed pain symptoms or management strategies to pain management provider Advised patient to report to care team affect of pain on daily activities Discussed use of relaxation techniques and/or diversional activities to assist with pain reduction (distraction, imagery, relaxation, massage, acupressure, TENS, heat, and cold application Reviewed with patient prescribed pharmacological and nonpharmacological pain relief strategies Assessed social determinant of health barriers Will discuss with CSW ordering 3in1 BSC  Encouraged to discuss other pain patches with provider         SDOH assessments and interventions completed:  Yes  SDOH Interventions Today    Flowsheet Row Most Recent Value  SDOH Interventions   Food Insecurity Interventions Intervention Not Indicated  Housing Interventions Intervention Not Indicated  Transportation Interventions Intervention Not Indicated  Utilities Interventions Intervention Not Indicated        Care Coordination  Interventions Activated:  Yes  Care Coordination Interventions:  Yes, provided   Follow up plan: Follow up call scheduled for 10/27    Encounter Outcome:  Pt. Visit Completed   Valente David, RN, MSN, [place] Care Management Care Management Coordinator [phone]

## 2022-06-30 NOTE — Patient Instructions (Signed)
Visit Information  Thank you for taking time to visit with me today. Please don't hesitate to contact me if I can be of assistance to you before our next scheduled telephone appointment.  Following are the goals we discussed today:  Discuss alternate pain patch with provider  Our next appointment is by telephone on 10/27  Please call the care guide team at [phone] if you need to cancel or reschedule your appointment.   Please call the Suicide and Crisis Lifeline: 988 call the [place] National Suicide Prevention Lifeline: 601-134-4100 or TTY: 636-567-0741 TTY (985) 364-3365) to talk to a trained counselor call 1-800-273-TALK (toll free, 24 hour hotline) call the [facility]: ([phone])320-4860 call 911 if you are experiencing a Mental Health or [place] or need someone to talk to.  Patient verbalizes understanding of instructions and care plan provided today and agrees to view in [place]. Active MyChart status and patient understanding of how to access instructions and care plan via MyChart confirmed with patient.     The patient has been provided with contact information for the care management team and has been advised to call with any health related questions or concerns.   Valente David, RN, MSN, [place] Care Management Care Management Coordinator [phone]

## 2022-07-01 ENCOUNTER — Other Ambulatory Visit: Payer: Self-pay

## 2022-07-01 ENCOUNTER — Ambulatory Visit
Admission: RE | Admit: 2022-07-01 | Discharge: 2022-07-01 | Disposition: A | Discharge status: Home / Self Care | Payer: Medicare Other | Source: Ambulatory Visit | Attending: Radiation Oncology | Admitting: Radiation Oncology

## 2022-07-01 ENCOUNTER — Ambulatory Visit: Payer: Medicare Other

## 2022-07-01 DIAGNOSIS — C50912 Malignant neoplasm of unspecified site of left female breast: Secondary | ICD-10-CM | POA: Diagnosis not present

## 2022-07-01 DIAGNOSIS — C7951 Secondary malignant neoplasm of bone: Secondary | ICD-10-CM | POA: Diagnosis not present

## 2022-07-01 DIAGNOSIS — Z510 Encounter for antineoplastic radiation therapy: Secondary | ICD-10-CM | POA: Diagnosis not present

## 2022-07-01 DIAGNOSIS — C7931 Secondary malignant neoplasm of brain: Secondary | ICD-10-CM | POA: Diagnosis not present

## 2022-07-01 LAB — RAD ONC ARIA SESSION SUMMARY
Course Elapsed Days: 15
Plan Fractions Treated to Date: 10
Plan Fractions Treated to Date: 10
Plan Fractions Treated to Date: 10
Plan Prescribed Dose Per Fraction: 2.5 Gy
Plan Prescribed Dose Per Fraction: 2.5 Gy
Plan Prescribed Dose Per Fraction: 2.5 Gy
Plan Total Fractions Prescribed: 15
Plan Total Fractions Prescribed: 15
Plan Total Fractions Prescribed: 15
Plan Total Prescribed Dose: 37.5 Gy
Plan Total Prescribed Dose: 37.5 Gy
Plan Total Prescribed Dose: 37.5 Gy
Reference Point Dosage Given to Date: 25 Gy
Reference Point Dosage Given to Date: 25 Gy
Reference Point Dosage Given to Date: 25 Gy
Reference Point Session Dosage Given: 2.5 Gy
Reference Point Session Dosage Given: 2.5 Gy
Reference Point Session Dosage Given: 2.5 Gy
Session Number: 11

## 2022-07-02 ENCOUNTER — Ambulatory Visit: Payer: Medicare Other

## 2022-07-02 ENCOUNTER — Ambulatory Visit
Admission: RE | Admit: 2022-07-02 | Discharge: 2022-07-02 | Disposition: A | Discharge status: Home / Self Care | Payer: Medicare Other | Source: Ambulatory Visit | Attending: Radiation Oncology | Admitting: Radiation Oncology

## 2022-07-02 ENCOUNTER — Other Ambulatory Visit: Payer: Self-pay

## 2022-07-02 ENCOUNTER — Other Ambulatory Visit: Payer: Self-pay | Admitting: Radiation Oncology

## 2022-07-02 DIAGNOSIS — C7931 Secondary malignant neoplasm of brain: Secondary | ICD-10-CM | POA: Diagnosis not present

## 2022-07-02 DIAGNOSIS — Z510 Encounter for antineoplastic radiation therapy: Secondary | ICD-10-CM | POA: Diagnosis not present

## 2022-07-02 LAB — RAD ONC ARIA SESSION SUMMARY
Course Elapsed Days: 16
Plan Fractions Treated to Date: 11
Plan Fractions Treated to Date: 11
Plan Fractions Treated to Date: 11
Plan Prescribed Dose Per Fraction: 2.5 Gy
Plan Prescribed Dose Per Fraction: 2.5 Gy
Plan Prescribed Dose Per Fraction: 2.5 Gy
Plan Total Fractions Prescribed: 15
Plan Total Fractions Prescribed: 15
Plan Total Fractions Prescribed: 15
Plan Total Prescribed Dose: 37.5 Gy
Plan Total Prescribed Dose: 37.5 Gy
Plan Total Prescribed Dose: 37.5 Gy
Reference Point Dosage Given to Date: 27.5 Gy
Reference Point Dosage Given to Date: 27.5 Gy
Reference Point Dosage Given to Date: 27.5 Gy
Reference Point Session Dosage Given: 2.5 Gy
Reference Point Session Dosage Given: 2.5 Gy
Reference Point Session Dosage Given: 2.5 Gy
Session Number: 12

## 2022-07-02 MED ORDER — SUCRALFATE 1 G PO TABS: 1.0000 g | ORAL_TABLET | Freq: Four times a day (QID) | ORAL | 1 refills | Status: DC

## 2022-07-02 NOTE — Progress Notes (Signed)
  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   There were no signs of acute toxicity after treatment. We will repeat her MRI brain in 1 month to follow up on a punctate finding that could be benign.  Plan: The patient will receive a call in about one month from the radiation oncology department. She will continue follow up with us [place] the brain oncology program as well as with Dr. KatDelton Coombes well.       AliCarola RhineAC

## 2022-07-03 ENCOUNTER — Ambulatory Visit: Payer: Medicare Other

## 2022-07-06 ENCOUNTER — Other Ambulatory Visit: Payer: Self-pay

## 2022-07-06 ENCOUNTER — Ambulatory Visit: Payer: Medicare Other

## 2022-07-06 ENCOUNTER — Ambulatory Visit
Admission: RE | Admit: 2022-07-06 | Discharge: 2022-07-06 | Disposition: A | Discharge status: Home / Self Care | Payer: Medicare Other | Source: Ambulatory Visit | Attending: Radiation Oncology | Admitting: Radiation Oncology

## 2022-07-06 DIAGNOSIS — Z510 Encounter for antineoplastic radiation therapy: Secondary | ICD-10-CM | POA: Diagnosis not present

## 2022-07-06 DIAGNOSIS — C7931 Secondary malignant neoplasm of brain: Secondary | ICD-10-CM | POA: Diagnosis not present

## 2022-07-06 LAB — RAD ONC ARIA SESSION SUMMARY
Course Elapsed Days: 20
Plan Fractions Treated to Date: 12
Plan Fractions Treated to Date: 12
Plan Fractions Treated to Date: 12
Plan Prescribed Dose Per Fraction: 2.5 Gy
Plan Prescribed Dose Per Fraction: 2.5 Gy
Plan Prescribed Dose Per Fraction: 2.5 Gy
Plan Total Fractions Prescribed: 15
Plan Total Fractions Prescribed: 15
Plan Total Fractions Prescribed: 15
Plan Total Prescribed Dose: 37.5 Gy
Plan Total Prescribed Dose: 37.5 Gy
Plan Total Prescribed Dose: 37.5 Gy
Reference Point Dosage Given to Date: 30 Gy
Reference Point Dosage Given to Date: 30 Gy
Reference Point Dosage Given to Date: 30 Gy
Reference Point Session Dosage Given: 2.5 Gy
Reference Point Session Dosage Given: 2.5 Gy
Reference Point Session Dosage Given: 2.5 Gy
Session Number: 13

## 2022-07-07 ENCOUNTER — Ambulatory Visit: Payer: Medicare Other

## 2022-07-07 ENCOUNTER — Other Ambulatory Visit: Payer: Self-pay

## 2022-07-07 ENCOUNTER — Ambulatory Visit
Admission: RE | Admit: 2022-07-07 | Discharge: 2022-07-07 | Disposition: A | Discharge status: Home / Self Care | Payer: Medicare Other | Source: Ambulatory Visit | Attending: Radiation Oncology | Admitting: Radiation Oncology

## 2022-07-07 DIAGNOSIS — C7931 Secondary malignant neoplasm of brain: Secondary | ICD-10-CM | POA: Diagnosis not present

## 2022-07-07 DIAGNOSIS — Z510 Encounter for antineoplastic radiation therapy: Secondary | ICD-10-CM | POA: Diagnosis not present

## 2022-07-07 LAB — RAD ONC ARIA SESSION SUMMARY
Course Elapsed Days: 21
Plan Fractions Treated to Date: 13
Plan Fractions Treated to Date: 13
Plan Fractions Treated to Date: 13
Plan Prescribed Dose Per Fraction: 2.5 Gy
Plan Prescribed Dose Per Fraction: 2.5 Gy
Plan Prescribed Dose Per Fraction: 2.5 Gy
Plan Total Fractions Prescribed: 15
Plan Total Fractions Prescribed: 15
Plan Total Fractions Prescribed: 15
Plan Total Prescribed Dose: 37.5 Gy
Plan Total Prescribed Dose: 37.5 Gy
Plan Total Prescribed Dose: 37.5 Gy
Reference Point Dosage Given to Date: 32.5 Gy
Reference Point Dosage Given to Date: 32.5 Gy
Reference Point Dosage Given to Date: 32.5 Gy
Reference Point Session Dosage Given: 2.5 Gy
Reference Point Session Dosage Given: 2.5 Gy
Reference Point Session Dosage Given: 2.5 Gy
Session Number: 14

## 2022-07-08 ENCOUNTER — Other Ambulatory Visit: Payer: Self-pay

## 2022-07-08 ENCOUNTER — Ambulatory Visit
Admission: RE | Admit: 2022-07-08 | Discharge: 2022-07-08 | Disposition: A | Discharge status: Home / Self Care | Payer: Medicare Other | Source: Ambulatory Visit | Attending: Radiation Oncology | Admitting: Radiation Oncology

## 2022-07-08 ENCOUNTER — Ambulatory Visit: Payer: Medicare Other

## 2022-07-08 DIAGNOSIS — Z510 Encounter for antineoplastic radiation therapy: Secondary | ICD-10-CM | POA: Diagnosis not present

## 2022-07-08 DIAGNOSIS — C7931 Secondary malignant neoplasm of brain: Secondary | ICD-10-CM | POA: Diagnosis not present

## 2022-07-08 LAB — RAD ONC ARIA SESSION SUMMARY
Course Elapsed Days: 22
Plan Fractions Treated to Date: 14
Plan Fractions Treated to Date: 14
Plan Fractions Treated to Date: 14
Plan Prescribed Dose Per Fraction: 2.5 Gy
Plan Prescribed Dose Per Fraction: 2.5 Gy
Plan Prescribed Dose Per Fraction: 2.5 Gy
Plan Total Fractions Prescribed: 15
Plan Total Fractions Prescribed: 15
Plan Total Fractions Prescribed: 15
Plan Total Prescribed Dose: 37.5 Gy
Plan Total Prescribed Dose: 37.5 Gy
Plan Total Prescribed Dose: 37.5 Gy
Reference Point Dosage Given to Date: 35 Gy
Reference Point Dosage Given to Date: 35 Gy
Reference Point Dosage Given to Date: 35 Gy
Reference Point Session Dosage Given: 2.5 Gy
Reference Point Session Dosage Given: 2.5 Gy
Reference Point Session Dosage Given: 2.5 Gy
Session Number: 15

## 2022-07-09 ENCOUNTER — Ambulatory Visit
Admission: RE | Admit: 2022-07-09 | Discharge: 2022-07-09 | Disposition: A | Discharge status: Home / Self Care | Payer: Medicare Other | Source: Ambulatory Visit | Attending: Radiation Oncology | Admitting: Radiation Oncology

## 2022-07-09 ENCOUNTER — Other Ambulatory Visit: Payer: Self-pay

## 2022-07-09 ENCOUNTER — Encounter: Payer: Self-pay | Admitting: Radiation Oncology

## 2022-07-09 DIAGNOSIS — Z510 Encounter for antineoplastic radiation therapy: Secondary | ICD-10-CM | POA: Diagnosis not present

## 2022-07-09 DIAGNOSIS — C7951 Secondary malignant neoplasm of bone: Secondary | ICD-10-CM | POA: Diagnosis not present

## 2022-07-09 DIAGNOSIS — C7931 Secondary malignant neoplasm of brain: Secondary | ICD-10-CM | POA: Diagnosis not present

## 2022-07-09 DIAGNOSIS — C50912 Malignant neoplasm of unspecified site of left female breast: Secondary | ICD-10-CM | POA: Diagnosis not present

## 2022-07-09 LAB — RAD ONC ARIA SESSION SUMMARY
Course Elapsed Days: 23
Plan Fractions Treated to Date: 15
Plan Fractions Treated to Date: 15
Plan Fractions Treated to Date: 15
Plan Prescribed Dose Per Fraction: 2.5 Gy
Plan Prescribed Dose Per Fraction: 2.5 Gy
Plan Prescribed Dose Per Fraction: 2.5 Gy
Plan Total Fractions Prescribed: 15
Plan Total Fractions Prescribed: 15
Plan Total Fractions Prescribed: 15
Plan Total Prescribed Dose: 37.5 Gy
Plan Total Prescribed Dose: 37.5 Gy
Plan Total Prescribed Dose: 37.5 Gy
Reference Point Dosage Given to Date: 37.5 Gy
Reference Point Dosage Given to Date: 37.5 Gy
Reference Point Dosage Given to Date: 37.5 Gy
Reference Point Session Dosage Given: 2.5 Gy
Reference Point Session Dosage Given: 2.5 Gy
Reference Point Session Dosage Given: 2.5 Gy
Session Number: 16

## 2022-07-10 ENCOUNTER — Ambulatory Visit: Payer: Self-pay | Admitting: *Deleted

## 2022-07-10 ENCOUNTER — Encounter: Payer: Self-pay | Admitting: *Deleted

## 2022-07-10 NOTE — Patient Outreach (Signed)
  Care Coordination   Follow Up Visit Note   07/10/2022 Name: PHIL CORTI MRN: [id number] DOB: 02-18-1951  Denise [name] is a 71 y.o. year old female who sees Hasanaj, [name], MD for primary care. I spoke with  Denise [name] by phone today.  What matters to the patients health and wellness today?  Obtaining DME supplies    Goals Addressed             This Visit's Progress    Left Leg Weakness & Unsteady Gait       Care Coordination Interventions: Evaluation of current treatment plan related to metastatic breast cancer and patient's adherence to plan as established by provider Assessed social determinant of health barriers Assessed mobility and ability to perform ADLs. Left leg weakness and unsteady gait. Has difficulty standing from a seated position, especially from the toilet.  Assessed family/Social support Provided with [facility]'s direct telephone number [phone] and encouraged to reach out as needed Discussed need for quad cane and 3-in-1 bedside commode Reached out to [facility] in [place] at [phone] and verified that they have those in [place]. Discussed cost and insurance coverage. Medicare will cover quad cane but will not cover bedside commode because she isn't confined to one room of her home. Cost is $50 Advised patient that she will need a face-to-face visit with PCP that documents the diagnosis that supports the need for this equipment. Order with Name, DOB, and Dx code, along with Medicare Card, Driver's license, and office notes will need to be faxed to [place] at [phone]. Patient or provider's office will need to call [facility] after the order is sent in to verify receipt and give them the go ahead to proceed with filling it. They will deliver both to her home if she is going to purchase the bedside commode.  Reached out to Dr Sherrie Sport [phone]) and provided switchboard operator with the information above. She will reach out to patient to schedule an  appointment. Pt aware.   Added to [facility] POD 2 Complex Case Discussion spreadsheet        SDOH assessments and interventions completed:  Yes  SDOH Interventions Today    Flowsheet Row Most Recent Value  SDOH Interventions   Housing Interventions Intervention Not Indicated  Transportation Interventions Intervention Not Indicated  Financial Strain Interventions Intervention Not Indicated        Care Coordination Interventions Activated:  Yes  Care Coordination Interventions:  Yes, provided   Follow up plan: Follow up call scheduled for 07/15/22    Encounter Outcome:  Pt. Visit Completed   Chong Sicilian, BSN, RN-BC RN Care Coordinator [place]: [phone] Main #: [phone]

## 2022-07-14 DIAGNOSIS — C50912 Malignant neoplasm of unspecified site of left female breast: Secondary | ICD-10-CM | POA: Diagnosis not present

## 2022-07-15 ENCOUNTER — Ambulatory Visit
Admission: RE | Admit: 2022-07-15 | Discharge: 2022-07-15 | Disposition: A | Discharge status: Home / Self Care | Payer: Medicare Other | Source: Ambulatory Visit | Attending: Radiation Oncology | Admitting: Radiation Oncology

## 2022-07-15 ENCOUNTER — Ambulatory Visit: Payer: Self-pay | Admitting: *Deleted

## 2022-07-15 ENCOUNTER — Encounter: Payer: Self-pay | Admitting: *Deleted

## 2022-07-15 DIAGNOSIS — C7931 Secondary malignant neoplasm of brain: Secondary | ICD-10-CM

## 2022-07-15 MED ORDER — SODIUM CHLORIDE 0.9% FLUSH
10.0000 mL | INTRAVENOUS | Status: DC | PRN
  Administered 2022-07-15: 10 mL via INTRAVENOUS

## 2022-07-15 MED ORDER — DIPHENHYDRAMINE HCL 50 MG/ML IJ SOLN
50.0000 mg | Freq: Once | INTRAMUSCULAR | Status: AC
  Administered 2022-07-15: 50 mg via INTRAVENOUS

## 2022-07-15 MED ORDER — GADOPICLENOL 0.5 MMOL/ML IV SOLN
8.0000 mL | Freq: Once | INTRAVENOUS | Status: AC | PRN
  Administered 2022-07-15: 8 mL via INTRAVENOUS

## 2022-07-15 MED ORDER — HEPARIN SOD (PORK) LOCK FLUSH 100 UNIT/ML IV SOLN
500.0000 [IU] | Freq: Once | INTRAVENOUS | Status: AC
  Administered 2022-07-15: 500 [IU] via INTRAVENOUS

## 2022-07-15 NOTE — Patient Outreach (Signed)
  Care Coordination   Follow Up Visit Note   07/15/2022 Name: MONICK RENA MRN: [id number] DOB: 09-06-1951  MATALYN NAWAZ is a 71 y.o. year old female who sees Hasanaj, [name], MD for primary care. I spoke with  Charlynne Pander by phone today.  What matters to the patients health and wellness today?  Obtaining DME    Goals Addressed             This Visit's Progress    COMPLETED: Left Leg Weakness & Unsteady Gait       Care Coordination Interventions: Discussed need for DME order for cane and 3-in-1 bedside commode. Patient has an appointment with Dr Sherrie Sport tomorrow to get those orders. Previously provided Dr Joselyn Arrow office with what information needs to be on the orders for Medicare to cover the cane and that it should be faxed to [facility] at [phone] along with her Ins card and driver's license. They will deliver to patient's home once the order is processed.  Assessed for other questions, concerns, or care coordination needs. Patient denies any at this time and does not need a follow-up telephone call.  Provided with [facility] telephone number [phone] and encouraged to reach out if needed     COMPLETED: Pain management related to radiation treatments       Care Coordination Interventions: Reviewed provider established plan for pain management Discussed importance of adherence to all scheduled medical appointments Counseled on the importance of reporting any/all new or changed pain symptoms or management strategies to pain management provider Advised patient to report to care team affect of pain on daily activities Discussed use of relaxation techniques and/or diversional activities to assist with pain reduction (distraction, imagery, relaxation, massage, acupressure, TENS, heat, and cold application Reviewed with patient prescribed pharmacological and nonpharmacological pain relief strategies Assessed social determinant of health barriers Will discuss with CSW  ordering 3in1 BSC  Encouraged to discuss other pain patches with provider         SDOH assessments and interventions completed:  No     Care Coordination Interventions Activated:  Yes  Care Coordination Interventions:  Yes, provided   Follow up plan: No further intervention required.   Encounter Outcome:  Pt. Visit Completed   Chong Sicilian, BSN, RN-BC RN Care Coordinator [place] Direct Dial: [phone] Main #: [phone]

## 2022-07-15 NOTE — Progress Notes (Signed)
Patient brought to the nursing station by MRI tech when patient reported hives/itching after MRI contrast was administered for BRAIN SRS scan. Visible hives noted to head/chest and face upon inspection.  MD C. Green notified and evaluated patient in nursing station. Verbal order to give '50mg'$  Benadryl, see MAR.   Vital signs stable, no difficulty breathing, no tongue swelling. O2 saturation noted >97 on room air.   Symptoms improved after Benadryl admin. Monitored in nursing station for approx 20 min. MD Jeralyn Ruths also notified and evaluated in nursing station. With symptoms improving, MD okay to continue scan as all contrast has already been given. MRI tech to bring patient back to nursing station after scan completed to be monitored until discharged by radiologist.    Educated patient if symptoms reoccur after leaving facility to seek emergency care, patient verbalized understanding.   MD Jeralyn Ruths educated patient regarding taking a 13HR Prep prior to any future MRI scans w/contrast, patient verbalized understanding.  Allergies updated.

## 2022-07-16 DIAGNOSIS — C50919 Malignant neoplasm of unspecified site of unspecified female breast: Secondary | ICD-10-CM | POA: Diagnosis not present

## 2022-07-20 ENCOUNTER — Inpatient Hospital Stay: Payer: Medicare Other | Attending: Hematology

## 2022-07-20 ENCOUNTER — Encounter: Payer: Self-pay | Admitting: Hematology

## 2022-07-20 ENCOUNTER — Encounter (HOSPITAL_COMMUNITY): Payer: Self-pay | Admitting: Hematology

## 2022-07-20 ENCOUNTER — Ambulatory Visit
Admission: RE | Admit: 2022-07-20 | Discharge: 2022-07-20 | Disposition: A | Discharge status: Home / Self Care | Payer: Medicare Other | Source: Ambulatory Visit | Attending: Radiation Oncology | Admitting: Radiation Oncology

## 2022-07-20 DIAGNOSIS — Z79899 Other long term (current) drug therapy: Secondary | ICD-10-CM | POA: Insufficient documentation

## 2022-07-20 DIAGNOSIS — G6289 Other specified polyneuropathies: Secondary | ICD-10-CM | POA: Insufficient documentation

## 2022-07-20 DIAGNOSIS — I10 Essential (primary) hypertension: Secondary | ICD-10-CM | POA: Insufficient documentation

## 2022-07-20 DIAGNOSIS — C7951 Secondary malignant neoplasm of bone: Secondary | ICD-10-CM | POA: Insufficient documentation

## 2022-07-20 DIAGNOSIS — C7931 Secondary malignant neoplasm of brain: Secondary | ICD-10-CM

## 2022-07-20 DIAGNOSIS — C50912 Malignant neoplasm of unspecified site of left female breast: Secondary | ICD-10-CM

## 2022-07-20 DIAGNOSIS — E039 Hypothyroidism, unspecified: Secondary | ICD-10-CM | POA: Insufficient documentation

## 2022-07-20 DIAGNOSIS — Z79890 Hormone replacement therapy (postmenopausal): Secondary | ICD-10-CM | POA: Insufficient documentation

## 2022-07-20 DIAGNOSIS — Z171 Estrogen receptor negative status [ER-]: Secondary | ICD-10-CM | POA: Insufficient documentation

## 2022-07-20 DIAGNOSIS — Z9012 Acquired absence of left breast and nipple: Secondary | ICD-10-CM | POA: Insufficient documentation

## 2022-07-20 NOTE — Progress Notes (Signed)
Radiation Oncology         [phone] ________________________________  Outpatient Follow Up- Conducted via telephone at patient request.  I spoke with the patient to conduct this consult visit via telephone. The patient was notified in advance and was offered an in person or telemedicine meeting to allow for face to face communication but instead preferred to proceed with a telephone visit.  Name: Denise [name]        MRN: [id number]  Date of Service: 07/20/2022 DOB: 08/28/1951  EM:[id number], Denise Dada, MD  Neale Burly, MD     REFERRING PHYSICIAN: Neale Burly, MD   DIAGNOSIS: The primary encounter diagnosis was Invasive lobular carcinoma of left breast in female [facility]). A diagnosis of Metastasis to brain [facility]) was also pertinent to this visit.   HISTORY OF PRESENT ILLNESS: Denise [name] is a 71 y.o. female seen at the request of Dr. Delton Coombes for a new diagnosis of metastatic brain disease from her history of breast cancer.  The patient has a history of stage IIIC, cT3N3c triple negative invasive lobular carcinoma of the left breast for which she underwent neoadjuvant chemo and immunotherapy beginning in June 2022 through January 2023.  She remained on Keytruda with her last dose and consolidation being on 05/11/2022.    She was found to have progressive disease in the brain and multiple bones. She received single fraction SRS to three lesions on 06/16/22, but during her 3T MRI was found to have a punctate area of enhancement in the right lentiform nucleas. It was recommended she have a repeat MRI 1 month later. She also went on to receive a palliative course of radiation to the left chest wall along the scapula, T11, and proximal femur. She completed the palliative radiation as well without difficulty. Her repeat MRI brain on 07/15/22, showed stable post treatment related changes in the three lesions treated with SRS without new findings, and the lentiform nucleas lesion resolved  and was likely previous vascular enhancement. Her case was discussed in brain oncology conference this morning where these findings were reviewed. She's contacted by phone to review these results.   PREVIOUS RADIATION THERAPY:    06/18/22-07/09/22: The left chest including scapula, T11 spine, and proximal left femur were treated to 37.5 Gy in 15 fractions  06/16/22:   SRS Treatment: The following sites were treated to 20 Gy in 1 fraction PTV_1_FrontR_17m PTV_2_FrontR_537m PTV_3_Vermis_2m48m PAST MEDICAL HISTORY:  Past Medical History:  Diagnosis Date   Asthma    as child   [place]   Complication of anesthesia    pateitn states,' I coded when I had my D&Cmany years ago.   Dyspnea    Family history of breast cancer    Hypertension    Hypothyroidism    PONV (postoperative nausea and vomiting)    Port-A-Cath in place 02/26/2021       PAST SURGICAL HISTORY: Past Surgical History:  Procedure Laterality Date   ABDOMINAL HYSTERECTOMY     BIOPSY  01/17/2018   Procedure: BIOPSY;  Surgeon: RehRogene HoustonD;  [facility];  Service: Endoscopy;;  duodenum,gastric   CHOLECYSTECTOMY     COLONOSCOPY WITH PROPOFOL N/A 01/17/2018   Procedure: COLONOSCOPY WITH PROPOFOL;  Surgeon: RehRogene HoustonD;  [facility];  Service: Endoscopy;  Laterality: N/A;  7:30   DILATION AND CURETTAGE OF UTERUS     ESOPHAGOGASTRODUODENOSCOPY (EGD) WITH PROPOFOL N/A 01/17/2018  Procedure: ESOPHAGOGASTRODUODENOSCOPY (EGD) WITH PROPOFOL;  Surgeon: Rogene Houston, MD;  [facility];  Service: Endoscopy;  Laterality: N/A;   POLYPECTOMY  01/17/2018   Procedure: POLYPECTOMY;  Surgeon: Rogene Houston, MD;  [facility];  Service: Endoscopy;;  transverse colon, cecal   PORTACATH PLACEMENT Right 02/17/2021   Procedure: INSERTION PORT-A-CATH;  Surgeon: Coralie Keens, MD;  Location: [place];  Service: General;   Laterality: Right;   RADIOACTIVE SEED GUIDED AXILLARY SENTINEL LYMPH NODE Left 11/20/2021   Procedure: RADIOACTIVE SEED GUIDED LEFT AXILLARY SENTINEL LYMPH NODE DISSECTION;  Surgeon: Coralie Keens, MD;  Location: [place];  Service: General;  Laterality: Left;   TOTAL MASTECTOMY Left 11/20/2021   Procedure: LEFT TOTAL MASTECTOMY;  Surgeon: Coralie Keens, MD;  Location: [place];  Service: General;  Laterality: Left;     FAMILY HISTORY:  Family History  Problem Relation Age of Onset   Breast cancer Mother        dx in her 25s   Heart disease Mother    Thyroid disease Mother    Lung cancer Father        dx in his 88s   Thyroid disease Maternal Aunt    Thyroid nodules Maternal Grandmother 35       goiter   Thyroid disease Maternal Grandmother    Heart disease Maternal Grandfather    Heart disease Paternal Grandmother    Heart disease Paternal Grandfather    Thyroid disease Daughter    Thyroid disease Daughter    Cancer Maternal Uncle        NOS   Cancer Paternal Uncle        NOS   Breast cancer Cousin        pat first cousin died in her 22s;      SOCIAL HISTORY:  reports that she has never smoked. She has never used smokeless tobacco. She reports that she does not drink alcohol and does not use drugs.  Patient is widowed and lives in [place]. She has an adult daughter Denise [name] who lives in [place], and a son Denise [name] who lives in [place].    ALLERGIES: Exforge [amlodipine besylate-valsartan], Gadolinium derivatives, Gadopiclenol, Tape, Cheese, Levofloxacin in d5w, and Yeast-related products   MEDICATIONS:  Current Outpatient Medications  Medication Sig Dispense Refill   Acetaminophen (TYLENOL PO) Take 1 tablet by mouth as needed.     albuterol (VENTOLIN HFA) 108 (90 Base) MCG/ACT inhaler Inhale into the lungs.     amoxicillin (AMOXIL) 500 MG capsule Take 500 mg by mouth 3 (three) times daily.     benazepril  (LOTENSIN) 20 MG tablet Take by mouth.     clobetasol cream (TEMOVATE) 5.99 % Apply 1 Application topically 2 (two) times daily. Apply to hands and feet 30 g 3   gabapentin (NEURONTIN) 300 MG capsule Take 1 capsule by mouth at bedtime.     levothyroxine (SYNTHROID) 75 MCG tablet Take 100 mcg by mouth daily before breakfast.     lidocaine (XYLOCAINE) 5 % ointment Apply 1 Application topically 2 (two) times daily. Apply to hands and feet 35.44 g 2   losartan (COZAAR) 50 MG tablet Take 50 mg by mouth daily.     magnesium oxide (MAG-OX) 400 MG tablet Take 1 tablet (400 mg total) by mouth 2 (two) times daily. (Patient not taking: Reported on 06/08/2022) 60 tablet 6   Melatonin 5 MG TABS Take 10 mg by mouth. As needed  for sleep     Pembrolizumab (KEYTRUDA IV) Inject into the vein every 21 ( twenty-one) days.     sucralfate (CARAFATE) 1 g tablet Take 1 tablet (1 g total) by mouth 4 (four) times daily. Dissolve each tablet in 15 cc water before use. 120 tablet 1   No current facility-administered medications for this encounter.     REVIEW OF SYSTEMS: On review of systems, the patient reports that she is doing okay now. She had a terrible reaction apparently when she went to have her MRI scan. She states she was told she could never have that type of contrast again, but nursing notes indicate she could have this with premedications per one of the radiologists. She reports her BP was high at that time and she went to see her PCP who recommended she go with hospice care. She has had resolution of her hypertension with changes in her medication given by PCP. She is concerned about if she can have further care or treatments based on other recommendations for hospice. She has some headaches but these are classified as mild, no new changes in vision, speech,  movement, or confusion is noted.     PHYSICAL EXAM:  Unable to assess given encounter type    ECOG = 1  0 - Asymptomatic (Fully active, able to carry  on all predisease activities without restriction)  1 - Symptomatic but completely ambulatory (Restricted in physically strenuous activity but ambulatory and able to carry out work of a light or sedentary nature. For example, light housework, office work)  2 - Symptomatic, <50% in bed during the day (Ambulatory and capable of all self care but unable to carry out any work activities. Up and about more than 50% of waking hours)  3 - Symptomatic, >50% in bed, but not bedbound (Capable of only limited self-care, confined to bed or chair 50% or more of waking hours)  4 - Bedbound (Completely disabled. Cannot carry on any self-care. Totally confined to bed or chair)  5 - Death   Eustace Pen MM, Creech RH, Tormey DC, et al. [phone]). "Toxicity and response criteria of the [facility]". [place] Oncol. 5 (6): 649-55    LABORATORY DATA:  Lab Results  Component Value Date   WBC 3.2 (L) 05/11/2022   HGB 11.2 (L) 05/11/2022   HCT 32.4 (L) 05/11/2022   MCV 111.3 (H) 05/11/2022   PLT 195 05/11/2022   Lab Results  Component Value Date   NA 137 05/11/2022   K 4.1 05/11/2022   CL 108 05/11/2022   CO2 23 05/11/2022   Lab Results  Component Value Date   ALT 13 05/11/2022   AST 19 05/11/2022   ALKPHOS 75 05/11/2022   BILITOT 0.5 05/11/2022      RADIOGRAPHY: MR Brain W Wo Contrast  Result Date: 07/15/2022 CLINICAL DATA:  Brain Mets.  Assess treatment response. EXAM: MRI HEAD WITHOUT AND WITH CONTRAST TECHNIQUE: Multiplanar, multiecho pulse sequences of the brain and surrounding structures were obtained without and with intravenous contrast. CONTRAST:  8 ml Vueway COMPARISON:  MRI Brain 06/05/22 FINDINGS: Brain: No evidence of acute infarct. Unchanged small foci of microhemorrhage in the posterior right frontal lobe, in the left cerebellum. Redemonstrated multiple intracranial metastases, as below. T2/FLAIR hyperintense signal abnormality in the periventricular subcortical white  matter, as well as in the posterior right frontal lobe in the region of the known treated metastases is unchanged from prior exam. -redemonstrated 5 mm contrast-enhancing lesion in  the posterior right frontal lobe (series 100, image 56), unchanged from prior. -redemonstrated d 1.0 x 0.5 cm extra-axial contrast-enhancing lesion along the right cerebral convexity (series 100, image 63), previously 1.1 x 0.6 cm. -unchanged punctate focus of contrast enhancement of the cerebellar vermis (series 100, image 113). -additional previously reported contrast-enhancing lesion in the posterior right lentiform nucleus is not definitely visualized on this exam and may have been vascular. Vascular: Normal flow voids. Skull and upper cervical spine: Normal marrow signal. Sinuses/Orbits: Status post left lens replacement. There is a trace right mastoid effusion. Paranasal sinuses are clear. Other: None IMPRESSION: 1. Stable to slight interval decrease in the size of intracranial metastases in the right frontal lobe/along the fronto/parietal convexity. Unchanged appearance of the punctate lesion in the cerebellar vermis. Additional previously mentioned lesion in the posterior right lentiform nucleus is not definitively visualized and may have been vascular. 2. No new lesion visualized. . Electronically Signed   By: Marin Roberts M.D.   On: 07/15/2022 14:08       IMPRESSION/PLAN: 1. Progressive Metastatic Stage IIIC, cT3N3c triple negative invasive lobular carcinoma of the left breast with brain and bone disease. We reviewed the stable post treatment changes in her MRI and the resolution of the punctate focus seen on her prior MRI. The brain oncology conference recommendations were to proceed with repeat MRI in 3 months time. Her recent reaction to the contrast could limit [place] somewhat but we will touch base with Dr. Jobe Igo in neuro radiology to see what he thinks would be best for future surveillance. She needs to follow up with  Dr. Vickey Huger as well as she is interested in pursuing more systemic therapy rather than hospice based care which her PCP had suggested. We are in agreement with aggressive surveillance of her brain as well as systemic therapy if Dr. Delton Coombes is also in agreement.  2. Hypersensitivity versus Allergic Reaction to VueWay Contrast. We will follow up with neuro radiology to see what they recommend, but it appears she's tolerated two other types of contrast in the past.     This encounter was conducted via telephone.  The patient has provided two factor identification and has given verbal consent for this type of encounter and has been advised to only accept a meeting of this type in a secure network environment. The time spent during this encounter was 35 minutes including preparation, discussion, and coordination of the patient's care. The attendants for this meeting include  Hayden Pedro  and Charlynne Pander and her children Denise [name] and Denise [name] remotely by phone. During the encounter,   Hayden Pedro were located at [facility] Radiation Oncology Department.  Charlynne Pander was located at home with her children also remotely in different states.    Carola Rhine, [facility]   **Disclaimer: This note was dictated with voice recognition software. Similar sounding words can inadvertently be transcribed and this note may contain transcription errors which may not have been corrected upon publication of note.**

## 2022-07-20 NOTE — Progress Notes (Signed)
Telephone nursing appointment for Progressive Metastatic Stage IIIC, cT3N3c triple negative invasive lobular carcinoma of the left breast with brain and bone disease. I verified patient's identity and began nursing interview. Patient reports a rash, and elevated BP post receiving contrast for her MRI, that was treated w/ benadryl but BP is still elevated. Patient has been recommend by Dr. Blanch Media (PCP) to start hospice treatment on 07/21/2022. Patient has refused hospice treatment until speaking w/ Shona Simpson PA-C/ Dr. Lisbeth Renshaw to discuss prognosis. No other issues reported at this time.   Meaningful use complete.   Patient aware of their 3:00pm-07/20/2022 telephone appointment w/ Shona Simpson PA-C. I left my extension [phone] in case patient needs anything. Patient verbalized understanding. This concludes the nursing interview.   Patient contact [phone]   This concludes the nursing interview.   Leandra Kern, LPN

## 2022-07-22 ENCOUNTER — Encounter: Payer: Self-pay | Admitting: Hematology

## 2022-07-22 ENCOUNTER — Encounter (HOSPITAL_COMMUNITY): Payer: Self-pay | Admitting: Hematology

## 2022-07-22 NOTE — Progress Notes (Signed)
                                                                                                                                                             Patient Name: ESHIKA RECKART MRN: [id number] DOB: 02/17/1951 Referring Physician: Stoney Bang (Profile Not Attached) Date of Service: 07/09/2022  [facility] [place]                                                        End Of Treatment Note  Diagnoses: C79.31-Secondary malignant neoplasm of brain C79.51-Secondary malignant neoplasm of bone  Cancer Staging:  Progressive Metastatic Stage IIIC, cT3N3c triple negative invasive lobular carcinoma of the left breast with brain and bone disease        Intent: Palliative  Radiation Treatment Dates: 06/18/2022 through 07/09/2022 Site Technique Total Dose (Gy) Dose per Fx (Gy) Completed Fx Beam Energies  Femur Left: Ext_L_proxfem Complex 37.5/37.5 2.5 15/15 10X, 15X  Lumbar Spine: Spine_T11 Complex 37.5/37.5 2.5 15/15 10X, 15X  Scapula, Left: Chest_L_scap 3D 37.5/37.5 2.5 15/15 10X   Narrative: The patient tolerated radiation therapy relatively well.    Plan: The patient will receive a call in about one month from the radiation oncology department. She will continue follow up with Dr. Delton Coombes as well as with [place] next month when a repeat MRI will be performed of her brain and discussed in the brain oncology conference.   ________________________________________________    Carola Rhine, PAC

## 2022-07-29 ENCOUNTER — Inpatient Hospital Stay (HOSPITAL_BASED_OUTPATIENT_CLINIC_OR_DEPARTMENT_OTHER): Payer: Medicare Other | Admitting: Hematology

## 2022-07-29 VITALS — BP 110/75 | Temp 99.0°F | Resp 18 | Ht 65.0 in | Wt 168.8 lb

## 2022-07-29 DIAGNOSIS — Z171 Estrogen receptor negative status [ER-]: Secondary | ICD-10-CM | POA: Diagnosis not present

## 2022-07-29 DIAGNOSIS — C50912 Malignant neoplasm of unspecified site of left female breast: Secondary | ICD-10-CM | POA: Diagnosis not present

## 2022-07-29 DIAGNOSIS — G6289 Other specified polyneuropathies: Secondary | ICD-10-CM | POA: Diagnosis not present

## 2022-07-29 DIAGNOSIS — I10 Essential (primary) hypertension: Secondary | ICD-10-CM | POA: Diagnosis not present

## 2022-07-29 DIAGNOSIS — Z79890 Hormone replacement therapy (postmenopausal): Secondary | ICD-10-CM | POA: Diagnosis not present

## 2022-07-29 DIAGNOSIS — E039 Hypothyroidism, unspecified: Secondary | ICD-10-CM | POA: Diagnosis not present

## 2022-07-29 DIAGNOSIS — C7951 Secondary malignant neoplasm of bone: Secondary | ICD-10-CM | POA: Diagnosis not present

## 2022-07-29 DIAGNOSIS — Z9012 Acquired absence of left breast and nipple: Secondary | ICD-10-CM | POA: Diagnosis not present

## 2022-07-29 DIAGNOSIS — C7931 Secondary malignant neoplasm of brain: Secondary | ICD-10-CM | POA: Diagnosis not present

## 2022-07-29 DIAGNOSIS — Z79899 Other long term (current) drug therapy: Secondary | ICD-10-CM | POA: Diagnosis not present

## 2022-07-29 NOTE — Patient Instructions (Addendum)
[place] at [facility] Discharge Instructions   You were seen and examined today by Dr. Delton Coombes.  He reviewed the results of the special testing we sent on your tumor. He recommends starting you on treatment with a drug called sacituzumab Ivette Loyal). This is a monoclonal antibody that is targeted to attack and kill the cancer cells.   Treatment is given two weeks in a row with one week off. We will plan to start you soon.   We will plan to start you the week after Thanksgiving.   Return as scheduled.    Thank you for choosing [place] at [facility] to provide your oncology and hematology care.  To afford each patient quality time with our provider, please arrive at least 15 minutes before your scheduled appointment time.   If you have a lab appointment with the [place] please come in thru the Main Entrance and check in at the main information desk.  You need to re-schedule your appointment should you arrive 10 or more minutes late.  We strive to give you quality time with our providers, and arriving late affects you and other patients whose appointments are after yours.  Also, if you no show three or more times for appointments you may be dismissed from the clinic at the providers discretion.     Again, thank you for choosing [facility].  Our hope is that these requests will decrease the amount of time that you wait before being seen by our physicians.       _____________________________________________________________  Should you have questions after your visit to [facility], please contact our office at [phone] and follow the prompts.  Our office hours are 8:00 a.m. and 4:30 p.m. Monday - Friday.  Please note that voicemails left after 4:00 p.m. may not be returned until the following business day.  We are closed weekends and major holidays.  You do have access to a nurse 24-7, just call the main  number to the clinic [phone] and do not press any options, hold on the line and a nurse will answer the phone.    For prescription refill requests, have your pharmacy contact our office and allow 72 hours.    Due to Covid, you will need to wear a mask upon entering the hospital. If you do not have a mask, a mask will be given to you at the Main Entrance upon arrival. For doctor visits, patients may have 1 support person age 63 or older with them. For treatment visits, patients can not have anyone with them due to social distancing guidelines and our immunocompromised population.

## 2022-07-29 NOTE — Progress Notes (Signed)
[place], [place] [address]   CLINIC:  Medical Oncology/Hematology  PCP:  Neale Burly, MD [place] / [place] [phone]   REASON FOR VISIT:  Metastatic triple negative breast cancer to the bones.  PRIOR THERAPY: 1.  Neoadjuvant chemotherapy with weekly carboplatin and paclitaxel and dose dense AC with pembrolizumab  2. Left mastectomy and lymph node excision by Dr. Ninfa Linden on 11/20/2021  NGS Results: Requested and pending  CURRENT THERAPY: Sacituzumab govitecan  BRIEF ONCOLOGIC HISTORY:  Oncology History  Invasive lobular carcinoma of left breast in female [facility])  01/23/2021 Initial Diagnosis   Invasive lobular carcinoma of left breast in female [facility])   02/19/2021 Genetic Testing   Negative genetic testing on the CancerNext-Expanded+RNAinsight panel.  SMARCB1 VUS identified.  The CancerNext-Expanded gene panel offered by [facility] and includes sequencing and rearrangement analysis for the following 77 genes: AIP, ALK, APC*, ATM*, AXIN2, BAP1, BARD1, BLM, BMPR1A, BRCA1*, BRCA2*, BRIP1*, CDC73, CDH1*, CDK4, CDKN1B, CDKN2A, CHEK2*, CTNNA1, DICER1, FANCC, FH, FLCN, GALNT12, KIF1B, LZTR1, MAX, MEN1, MET, MLH1*, MSH2*, MSH3, MSH6*, MUTYH*, NBN, NF1*, NF2, NTHL1, PALB2*, PHOX2B, PMS2*, POT1, PRKAR1A, PTCH1, PTEN*, RAD51C*, RAD51D*, RB1, RECQL, RET, SDHA, SDHAF2, SDHB, SDHC, SDHD, SMAD4, SMARCA4, SMARCB1, SMARCE1, STK11, SUFU, TMEM127, TP53*, TSC1, TSC2, VHL and XRCC2 (sequencing and deletion/duplication); EGFR, EGLN1, HOXB13, KIT, MITF, PDGFRA, POLD1, and POLE (sequencing only); EPCAM and GREM1 (deletion/duplication only). DNA and RNA analyses performed for * genes. The report date is February 19, 2021.   02/27/2021 - 05/11/2022 Chemotherapy   Patient is on Treatment Plan : BREAST Pembrolizumab + Carboplatin D1,8,15+ Paclitaxel D1,8,15 q21d X 4 cycles / Pembrolizumab + AC q21d x 4 cycles     12/22/2021 Cancer Staging   Staging  form: Breast, AJCC 8th Edition - Pathologic stage from 12/22/2021: No Stage Recommended (ypT3, pN3a, cM0, G2, ER-, PR-, HER2-) - Signed by Derek Jack, MD on 12/22/2021 Histopathologic type: Lobular carcinoma, NOS Stage prefix: Post-therapy Method of lymph node assessment: Axillary lymph node dissection Multigene prognostic tests performed: None Histologic grading system: 3 grade system   07/06/2022 -  Chemotherapy   Patient is on Treatment Plan : BREAST METASTATIC Sacituzumab govitecan-hziy Ivette Loyal) D1,8 q21d       CANCER STAGING:  Cancer Staging  Invasive lobular carcinoma of left breast in female [facility]) Staging form: Breast, AJCC 8th Edition - Clinical stage from 01/23/2021: cT3, cN3c, G2, ER-, PR-, HER2- - Unsigned - Pathologic stage from 12/22/2021: No Stage Recommended (ypT3, pN3a, cM0, G2, ER-, PR-, HER2-) - Signed by Derek Jack, MD on 12/22/2021   INTERVAL HISTORY:  [name], a 71 y.o. female, seen for follow-up of metastatic triple negative breast cancer to the bones.  She has completed radiation therapy on 07/09/2022.  She has developed heartburn after radiation therapy which is improving.  Headaches are also improving.  She reports that she is walking without any assistive device.  She reported allergy to MRI dye with transient chest pain.  She is drinking 2 nutritional drinks per day, each 350 cal.  Occasional headaches are also improving.  She has upper thigh pains which are described as achy.  REVIEW OF SYSTEMS:  Review of Systems  Constitutional:  Negative for appetite change.  Respiratory:  Negative for shortness of breath.   Cardiovascular:  Positive for chest pain (Heartburn).  Gastrointestinal:  Negative for diarrhea, nausea and vomiting.  Musculoskeletal:  Positive for arthralgias (Left hip pain, left shoulder pain).  Skin:  Negative for rash.       Peeling - feet  Neurological:  Positive for dizziness and headaches.   Psychiatric/Behavioral:  Positive for sleep disturbance.   All other systems reviewed and are negative.   PAST MEDICAL/SURGICAL HISTORY:  Past Medical History:  Diagnosis Date   Asthma    as child   Cancer ([place]) 12/2020   left breast IMC   Complication of anesthesia    pateitn states,' I coded when I had my D&Cmany years ago.   Dyspnea    Family history of breast cancer    Hypertension    Hypothyroidism    PONV (postoperative nausea and vomiting)    Port-A-Cath in place 02/26/2021   Past Surgical History:  Procedure Laterality Date   ABDOMINAL HYSTERECTOMY     BIOPSY  01/17/2018   Procedure: BIOPSY;  Surgeon: Rogene Houston, MD;  [facility];  Service: Endoscopy;;  duodenum,gastric   CHOLECYSTECTOMY     COLONOSCOPY WITH PROPOFOL N/A 01/17/2018   Procedure: COLONOSCOPY WITH PROPOFOL;  Surgeon: Rogene Houston, MD;  [facility];  Service: Endoscopy;  Laterality: N/A;  7:30   DILATION AND CURETTAGE OF UTERUS     ESOPHAGOGASTRODUODENOSCOPY (EGD) WITH PROPOFOL N/A 01/17/2018   Procedure: ESOPHAGOGASTRODUODENOSCOPY (EGD) WITH PROPOFOL;  Surgeon: Rogene Houston, MD;  [facility];  Service: Endoscopy;  Laterality: N/A;   POLYPECTOMY  01/17/2018   Procedure: POLYPECTOMY;  Surgeon: Rogene Houston, MD;  [facility];  Service: Endoscopy;;  transverse colon, cecal   PORTACATH PLACEMENT Right 02/17/2021   Procedure: INSERTION PORT-A-CATH;  Surgeon: Coralie Keens, MD;  Location: [place];  Service: General;  Laterality: Right;   RADIOACTIVE SEED GUIDED AXILLARY SENTINEL LYMPH NODE Left 11/20/2021   Procedure: RADIOACTIVE SEED GUIDED LEFT AXILLARY SENTINEL LYMPH NODE DISSECTION;  Surgeon: Coralie Keens, MD;  Location: [place];  Service: General;  Laterality: Left;   TOTAL MASTECTOMY Left 11/20/2021   Procedure: LEFT TOTAL MASTECTOMY;  Surgeon: Coralie Keens, MD;  Location: [place];   Service: General;  Laterality: Left;    SOCIAL HISTORY:  Social History   Socioeconomic History   Marital status: Widowed    Spouse name: Not on file   Number of children: 3   Years of education: Not on file   Highest education level: Not on file  Occupational History   Occupation: [employer]   Occupation: retired  Tobacco Use   Smoking status: Never   Smokeless tobacco: Never  Vaping Use   Vaping Use: Never used  Substance and Sexual Activity   Alcohol use: Never   Drug use: Never   Sexual activity: Not Currently    Birth control/protection: Surgical  Other Topics Concern   Not on file  Social History Narrative   Not on file   Social Determinants of Health   Financial Resource Strain: [place]  (07/10/2022)   Overall Financial Resource Strain (CARDIA)    Difficulty of Paying Living Expenses: Not hard at all  Food Insecurity: No Food Insecurity (06/30/2022)   Hunger Vital Sign    Worried About Running Out of Food in the Last Year: Never true    [place] in the Last Year: Never true  Transportation Needs: No Transportation Needs (07/10/2022)   PRAPARE - Hydrologist (Medical): No    Lack of Transportation (Non-Medical): No  Physical Activity: Insufficiently Active (  01/22/2021)   Exercise Vital Sign    Days of Exercise per Week: 3 days    Minutes of Exercise per Session: 30 min  Stress: No Stress Concern Present (01/22/2021)   [place]    Feeling of Stress : Not at all  Social Connections: Moderately Integrated (01/22/2021)   Social Connection and Isolation Panel [NHANES]    Frequency of Communication with Friends and Family: More than three times a week    Frequency of Social Gatherings with Friends and Family: More than three times a week    Attends Religious Services: More than 4 times per year    Active Member of [employer] or Organizations: No     Attends Music therapist: More than 4 times per year    Marital Status: Widowed  Intimate Partner Violence: Not At Risk (05/28/2022)   Humiliation, Afraid, Rape, and Kick questionnaire    Fear of Current or Ex-Partner: No    Emotionally Abused: No    Physically Abused: No    Sexually Abused: No    FAMILY HISTORY:  Family History  Problem Relation Age of Onset   Breast cancer Mother        dx in her 26s   Heart disease Mother    Thyroid disease Mother    Lung cancer Father        dx in his 81s   Thyroid disease Maternal Aunt    Thyroid nodules Maternal Grandmother 35       goiter   Thyroid disease Maternal Grandmother    Heart disease Maternal Grandfather    Heart disease Paternal Grandmother    Heart disease Paternal Grandfather    Thyroid disease Daughter    Thyroid disease Daughter    Cancer Maternal Uncle        NOS   Cancer Paternal Uncle        NOS   Breast cancer Cousin        pat first cousin died in her 73s;     CURRENT MEDICATIONS:  Current Outpatient Medications  Medication Sig Dispense Refill   Acetaminophen (TYLENOL PO) Take 1 tablet by mouth as needed.     cefUROXime (CEFTIN) 500 MG tablet Take 500 mg by mouth 2 (two) times daily.     gabapentin (NEURONTIN) 300 MG capsule Take 1 capsule by mouth at bedtime.     hydrALAZINE (APRESOLINE) 25 MG tablet Take 25 mg by mouth 3 (three) times daily.     levothyroxine (SYNTHROID) 75 MCG tablet Take 100 mcg by mouth daily before breakfast.     Melatonin 5 MG TABS Take 10 mg by mouth. As needed for sleep     olmesartan-hydrochlorothiazide (BENICAR HCT) 40-25 MG tablet Take 1 tablet by mouth daily.     pantoprazole (PROTONIX) 40 MG tablet Take 40 mg by mouth daily.     No current facility-administered medications for this visit.    ALLERGIES:  Allergies  Allergen Reactions   Exforge [Amlodipine Besylate-Valsartan] Nausea And Vomiting   Gadolinium Derivatives Hives and Itching    13hr prep prior to  contrast admin    Gadopiclenol Hives and Itching    13 hr prep prior to contrast admin    Tape Other (See Comments)    Redness and Irriatation   Cheese     Hard cheese   Levofloxacin In D5w Hives   Strawberry Extract Diarrhea    Seeds,nuts, lettuce, grapes   Yeast-Related Products  Hives    Mold on bread    PHYSICAL EXAM:  Performance status (ECOG): 1 - Symptomatic but completely ambulatory  Vitals:   07/29/22 0846  BP: 110/75  Resp: 18  Temp: 99 F (37.2 C)  SpO2: 97%    Wt Readings from Last 3 Encounters:  07/29/22 168 lb 12.8 oz (76.6 kg)  06/09/22 178 lb (80.7 kg)  06/08/22 179 lb 3.2 oz (81.3 kg)   Physical Exam Vitals reviewed.  Constitutional:      Appearance: Normal appearance.  Cardiovascular:     Rate and Rhythm: Normal rate and regular rhythm.     Pulses: Normal pulses.     Heart sounds: Normal heart sounds.  Pulmonary:     Effort: Pulmonary effort is normal.     Breath sounds: Normal breath sounds.  Skin:    Comments: Skin peeling on L great toe  Neurological:     General: No focal deficit present.     Mental Status: She is alert and oriented to person, place, and time.  Psychiatric:        Mood and Affect: Mood normal.        Behavior: Behavior normal.     LABORATORY DATA:  I have reviewed the labs as listed.     Latest Ref Rng & Units 05/11/2022    9:00 AM 04/20/2022    9:46 AM 03/26/2022    8:36 AM  CBC  WBC 4.0 - 10.5 K/uL 3.2  2.8  3.4   Hemoglobin 12.0 - 15.0 g/dL 11.2  10.8  11.1   Hematocrit 36.0 - 46.0 % 32.4  31.7  32.3   Platelets 150 - 400 K/uL 195  171  166       Latest Ref Rng & Units 05/11/2022    9:00 AM 04/20/2022    9:46 AM 03/26/2022    8:36 AM  CMP  Glucose 70 - 99 mg/dL 97  98  98   BUN 8 - 23 mg/dL _0 Creatinine 0.44 - 1.00 mg/dL 0.80  0.74  0.79   Sodium 135 - 145 mmol/L 137  139  135   Potassium 3.5 - 5.1 mmol/L 4.1  3.8  3.7   Chloride 98 - 111 mmol/L 108  108  105   CO2 22 - 32 mmol/L _1 Calcium 8.9 - 10.3 mg/dL 8.9  9.0  8.7   Total Protein 6.5 - 8.1 g/dL 7.4  7.0  6.9   Total Bilirubin 0.3 - 1.2 mg/dL 0.5  0.7  0.7   Alkaline Phos 38 - 126 U/L 75  75  75   AST 15 - 41 U/L _2 ALT 0 - 44 U/L _3 DIAGNOSTIC IMAGING:  I have independently reviewed the scans and discussed with the patient. MR Brain W Wo Contrast  Result Date: 07/15/2022 CLINICAL DATA:  Brain Mets.  Assess treatment response. EXAM: MRI HEAD WITHOUT AND WITH CONTRAST TECHNIQUE: Multiplanar, multiecho pulse sequences of the brain and surrounding structures were obtained without and with intravenous contrast. CONTRAST:  8 ml Vueway COMPARISON:  MRI Brain 06/05/22 FINDINGS: Brain: No evidence of acute infarct. Unchanged small foci of microhemorrhage in the posterior right frontal lobe, in the left cerebellum. Redemonstrated multiple intracranial metastases, as below. T2/FLAIR hyperintense signal abnormality in the periventricular subcortical white matter, as well as in the posterior right frontal  lobe in the region of the known treated metastases is unchanged from prior exam. -redemonstrated 5 mm contrast-enhancing lesion in the posterior right frontal lobe (series 100, image 56), unchanged from prior. -redemonstrated d 1.0 x 0.5 cm extra-axial contrast-enhancing lesion along the right cerebral convexity (series 100, image 63), previously 1.1 x 0.6 cm. -unchanged punctate focus of contrast enhancement of the cerebellar vermis (series 100, image 113). -additional previously reported contrast-enhancing lesion in the posterior right lentiform nucleus is not definitely visualized on this exam and may have been vascular. Vascular: Normal flow voids. Skull and upper cervical spine: Normal marrow signal. Sinuses/Orbits: Status post left lens replacement. There is a trace right mastoid effusion. Paranasal sinuses are clear. Other: None IMPRESSION: 1. Stable to slight interval decrease in the size of intracranial  metastases in the right frontal lobe/along the fronto/parietal convexity. Unchanged appearance of the punctate lesion in the cerebellar vermis. Additional previously mentioned lesion in the posterior right lentiform nucleus is not definitively visualized and may have been vascular. 2. No new lesion visualized. . Electronically Signed   By: Marin Roberts M.D.   On: 07/15/2022 14:08     ASSESSMENT:  1.  T3N3c (stage IIIc) triple negative invasive lobular carcinoma of the left breast: - She felt lump in her left breast for more than 6 months, but thought it was secondary to fibrocystic disease which she had all her life.  When she started having pain, she reached out to Dr. Sherrie Sport. - Ultrasound-guided left breast and left axillary lymph node biopsy on 01/15/2021 - Pathology consistent with invasive lobular carcinoma, E-cadherin negative.  ER/PR/HER2 negative.  HER2 2+ by IHC, negative by FISH.  Ki-67 is 5%.  Lymph node core biopsy was consistent with metastatic carcinoma.  Grade 2. - PET scan on 02/03/2021 showed involvement of left supraclavicular, subpectoral, axillary lymph nodes along with the breast mass.  10 mm left lung nodule which is hypometabolic.  Spinal cord lesion at T12 level with a strong uptake. - MRI of the lumbar spine with and without contrast on 02/20/2021 showed no mass or abnormal enhancement within the canal at the T12 level to correspond to the site of PET scan positive.  No marrow replacing bone lesion. - 12 weeks of carboplatin and paclitaxel weekly and every 3 weeks pembrolizumab followed by 4 cycles of dose dense AC with pembrolizumab ([id number]) from 02/27/2021 through 10/01/2021 - PET scan on 10/16/2021: Reduction in metabolic activity in the size of the left axillary lymph node and no evidence of breast hypermetabolism on PET scan. - Left mastectomy and lymph node excision by Dr. Ninfa Linden on 11/20/2021 - Pathology: 6.2 cm invasive lobular carcinoma, grade 2, margins negative.  19/21  lymph nodes involved with macrometastasis.  ypT3, YPN3A - Adjuvant pembrolizumab started on 01/01/2022. -CREATE-X capecitabine 1500 mg twice daily 2 weeks on/1 week off started on 01/01/2022.  Last dose of pembrolizumab on 05/11/2022 - Germline mutation testing ([facility]) negative - [employer] (06/04/2022): T p53, K-ras V14 I, CDKN2A.  MSI-high not detected.     2.  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  She is non-smoker. - Mother died of breast cancer.  Maternal grandmother died very young in her 36s, sister has fibrocystic disease.  Father died of lung cancer and was a smoker.   PLAN:  1.  Metastatic TNBC to the bones: - PET scan (05/28/2022): Multifocal bone lesions involving T11 vertebral body, right posterior sixth rib, left femoral neck,  left scapular glenoid.  No other metastatic disease. - We discussed NGS testing results which did not show any major targetable mutations although showed benefit from sacituzumab govitecan and Enhertu. - She canceled her [facility] second opinion appointment and want to proceed with the treatment I have recommended. - We discussed the side effects of sacituzumab govitecan in detail. - She is having her family over for Thanksgiving this weekend.  Hence we will start her treatment in the first week of December. - I will see her on day 8 of treatment.  2.  Brain lesions: - SRS to the 3 brain lesions on 06/16/2022. - Left scapula, T11 spine and proximal left femur treated in 15 fractions from 06/18/2022 through 07/09/2022. - Brain MRI on 07/15/2022 with stable to slight interval decrease in size of intracranial metastasis.  No new lesions.  3.  Peripheral neuropathy: - Peripheral neuropathy in the toes is improving.  4.  Hypomagnesemia: - Continue magnesium supplements twice daily.  5.  Grade 2 HFSR of the feet: - This has completely resolved since she has been off of Xeloda.   Orders placed this  encounter:  No orders of the defined types were placed in this encounter.     Derek Jack, MD [place] [phone]

## 2022-07-30 ENCOUNTER — Other Ambulatory Visit: Payer: Self-pay

## 2022-08-08 NOTE — Progress Notes (Signed)
Pharmacist Chemotherapy Monitoring - Initial Assessment    Anticipated start date: 08/18/22   The following has been reviewed per standard work regarding the patient's treatment regimen: The patient's diagnosis, treatment plan and drug doses, and organ/hematologic function Lab orders and baseline tests specific to treatment regimen  The treatment plan start date, drug sequencing, and pre-medications Prior authorization status  Patient's documented medication list, including drug-drug interaction screen and prescriptions for anti-emetics and supportive care specific to the treatment regimen The drug concentrations, fluid compatibility, administration routes, and timing of the medications to be used The patient's access for treatment and lifetime cumulative dose history, if applicable  The patient's medication allergies and previous infusion related reactions, if applicable   Changes made to treatment plan:  N/A  Follow up needed:  N/A   Wynona Neat, [facility], 08/08/2022  10:19 AM

## 2022-08-15 ENCOUNTER — Other Ambulatory Visit: Payer: Self-pay

## 2022-08-18 ENCOUNTER — Inpatient Hospital Stay: Payer: Medicare Other

## 2022-08-18 ENCOUNTER — Inpatient Hospital Stay: Payer: Medicare Other | Attending: Hematology

## 2022-08-18 VITALS — BP 137/85 | HR 63 | Temp 97.5°F | Resp 18

## 2022-08-18 DIAGNOSIS — C50912 Malignant neoplasm of unspecified site of left female breast: Secondary | ICD-10-CM | POA: Insufficient documentation

## 2022-08-18 DIAGNOSIS — Z5111 Encounter for antineoplastic chemotherapy: Secondary | ICD-10-CM | POA: Diagnosis not present

## 2022-08-18 DIAGNOSIS — Z95828 Presence of other vascular implants and grafts: Secondary | ICD-10-CM

## 2022-08-18 LAB — CBC WITH DIFFERENTIAL/PLATELET
Abs Immature Granulocytes: 0.01 10*3/uL (ref 0.00–0.07)
Basophils Absolute: 0 10*3/uL (ref 0.0–0.1)
Basophils Relative: 0 %
Eosinophils Absolute: 0.2 10*3/uL (ref 0.0–0.5)
Eosinophils Relative: 5 %
HCT: 33.6 % — ABNORMAL LOW (ref 36.0–46.0)
Hemoglobin: 11 g/dL — ABNORMAL LOW (ref 12.0–15.0)
Immature Granulocytes: 0 %
Lymphocytes Relative: 34 %
Lymphs Abs: 1.3 10*3/uL (ref 0.7–4.0)
MCH: 33.6 pg (ref 26.0–34.0)
MCHC: 32.7 g/dL (ref 30.0–36.0)
MCV: 102.8 fL — ABNORMAL HIGH (ref 80.0–100.0)
Monocytes Absolute: 0.4 10*3/uL (ref 0.1–1.0)
Monocytes Relative: 11 %
Neutro Abs: 1.9 10*3/uL (ref 1.7–7.7)
Neutrophils Relative %: 50 %
Platelets: 162 10*3/uL (ref 150–400)
RBC: 3.27 MIL/uL — ABNORMAL LOW (ref 3.87–5.11)
RDW: 12.8 % (ref 11.5–15.5)
WBC: 3.9 10*3/uL — ABNORMAL LOW (ref 4.0–10.5)
nRBC: 0 % (ref 0.0–0.2)

## 2022-08-18 LAB — COMPREHENSIVE METABOLIC PANEL
ALT: 27 U/L (ref 0–44)
AST: 30 U/L (ref 15–41)
Albumin: 3.3 g/dL — ABNORMAL LOW (ref 3.5–5.0)
Alkaline Phosphatase: 90 U/L (ref 38–126)
Anion gap: 9 (ref 5–15)
BUN: 16 mg/dL (ref 8–23)
CO2: 23 mmol/L (ref 22–32)
Calcium: 8.7 mg/dL — ABNORMAL LOW (ref 8.9–10.3)
Chloride: 106 mmol/L (ref 98–111)
Creatinine, Ser: 0.72 mg/dL (ref 0.44–1.00)
GFR, Estimated: >60 mL/min (ref >60)
Glucose, Bld: 97 mg/dL (ref 70–99)
Potassium: 3.6 mmol/L (ref 3.5–5.1)
Sodium: 138 mmol/L (ref 135–145)
Total Bilirubin: 0.4 mg/dL (ref 0.3–1.2)
Total Protein: 6.9 g/dL (ref 6.5–8.1)

## 2022-08-18 LAB — PHOSPHORUS: Phosphorus: 3 mg/dL (ref 2.5–4.6)

## 2022-08-18 LAB — MAGNESIUM: Magnesium: 1.7 mg/dL (ref 1.7–2.4)

## 2022-08-18 MED ORDER — FAMOTIDINE IN NACL 20-0.9 MG/50ML-% IV SOLN
20.0000 mg | Freq: Once | INTRAVENOUS | Status: AC
  Administered 2022-08-18: 20 mg via INTRAVENOUS
  Filled 2022-08-18: qty 50

## 2022-08-18 MED ORDER — SODIUM CHLORIDE 0.9% FLUSH
10.0000 mL | Freq: Once | INTRAVENOUS | Status: AC
  Administered 2022-08-18: 10 mL

## 2022-08-18 MED ORDER — SODIUM CHLORIDE 0.9 % IV SOLN
10.0000 mg | Freq: Once | INTRAVENOUS | Status: AC
  Administered 2022-08-18: 10 mg via INTRAVENOUS
  Filled 2022-08-18: qty 10

## 2022-08-18 MED ORDER — ATROPINE SULFATE 1 MG/ML IV SOLN
0.5000 mg | Freq: Once | INTRAVENOUS | Status: DC | PRN
  Filled 2022-08-18: qty 1

## 2022-08-18 MED ORDER — SODIUM CHLORIDE 0.9 % IV SOLN: Freq: Once | INTRAVENOUS | Status: AC

## 2022-08-18 MED ORDER — DIPHENHYDRAMINE HCL 50 MG/ML IJ SOLN
50.0000 mg | Freq: Once | INTRAMUSCULAR | Status: AC
  Administered 2022-08-18: 50 mg via INTRAVENOUS
  Filled 2022-08-18: qty 1

## 2022-08-18 MED ORDER — SODIUM CHLORIDE 0.9% FLUSH
10.0000 mL | INTRAVENOUS | Status: DC | PRN
  Administered 2022-08-18: 10 mL

## 2022-08-18 MED ORDER — ACETAMINOPHEN 325 MG PO TABS
650.0000 mg | ORAL_TABLET | Freq: Once | ORAL | Status: AC
  Administered 2022-08-18: 650 mg via ORAL
  Filled 2022-08-18: qty 2

## 2022-08-18 MED ORDER — SODIUM CHLORIDE 0.9 % IV SOLN
10.0000 mg/kg | Freq: Once | INTRAVENOUS | Status: AC
  Administered 2022-08-18: 810 mg via INTRAVENOUS
  Filled 2022-08-18: qty 81

## 2022-08-18 MED ORDER — PALONOSETRON HCL INJECTION 0.25 MG/5ML
0.2500 mg | Freq: Once | INTRAVENOUS | Status: AC
  Administered 2022-08-18: 0.25 mg via INTRAVENOUS
  Filled 2022-08-18: qty 5

## 2022-08-18 MED ORDER — HEPARIN SOD (PORK) LOCK FLUSH 100 UNIT/ML IV SOLN
500.0000 [IU] | Freq: Once | INTRAVENOUS | Status: AC | PRN
  Administered 2022-08-18: 500 [IU]

## 2022-08-18 MED ORDER — SODIUM CHLORIDE 0.9 % IV SOLN
150.0000 mg | Freq: Once | INTRAVENOUS | Status: AC
  Administered 2022-08-18: 150 mg via INTRAVENOUS
  Filled 2022-08-18: qty 150

## 2022-08-18 NOTE — Patient Instructions (Addendum)
[facility]  Discharge Instructions: Thank you for choosing [facility] to provide your oncology and hematology care.  If you have a lab appointment with the [facility], please come in thru the Main Entrance and check in at the main information desk.  Wear comfortable clothing and clothing appropriate for easy access to any Portacath or PICC line.   We strive to give you quality time with your provider. You may need to reschedule your appointment if you arrive late (15 or more minutes).  Arriving late affects you and other patients whose appointments are after yours.  Also, if you miss three or more appointments without notifying the office, you may be dismissed from the clinic at the provider's discretion.      For prescription refill requests, have your pharmacy contact our office and allow 72 hours for refills to be completed.    Today you received the following chemotherapy and/or immunotherapy agents Trodelvy.      To help prevent nausea and vomiting after your treatment, we encourage you to take your nausea medication as directed.  BELOW ARE SYMPTOMS THAT SHOULD BE REPORTED IMMEDIATELY: *FEVER GREATER THAN 100.4 F (38 C) OR HIGHER *CHILLS OR SWEATING *NAUSEA AND VOMITING THAT IS NOT CONTROLLED WITH YOUR NAUSEA MEDICATION *UNUSUAL SHORTNESS OF BREATH *UNUSUAL BRUISING OR BLEEDING *URINARY PROBLEMS (pain or burning when urinating, or frequent urination) *BOWEL PROBLEMS (unusual diarrhea, constipation, pain near the anus) TENDERNESS IN MOUTH AND THROAT WITH OR WITHOUT PRESENCE OF ULCERS (sore throat, sores in mouth, or a toothache) UNUSUAL RASH, SWELLING OR PAIN  UNUSUAL VAGINAL DISCHARGE OR ITCHING   Items with * indicate a potential emergency and should be followed up as soon as possible or go to the Emergency Department if any problems should occur.  Please show the CHEMOTHERAPY ALERT CARD or IMMUNOTHERAPY ALERT CARD at check-in to the  Emergency Department and triage nurse.  Should you have questions after your visit or need to cancel or reschedule your appointment, please contact [facility] [phone]  and follow the prompts.  Office hours are 8:00 a.m. to 4:30 p.m. Monday - Friday. Please note that voicemails left after 4:00 p.m. may not be returned until the following business day.  We are closed weekends and major holidays. You have access to a nurse at all times for urgent questions. Please call the main number to the clinic [phone] and follow the prompts.  For any non-urgent questions, you may also contact your provider using MyChart. We now offer e-Visits for anyone 86 and older to request care online for non-urgent symptoms. For details visit [email or web address].   Also download the MyChart app! Go to the app store, search "MyChart", open the app, select [facility], and log in with your MyChart username and password.  Masks are optional in the cancer centers. If you would like for your care team to wear a mask while they are taking care of you, please let them know. You may have one support person who is at least 73 years old accompany you for your appointments. [facility]  Discharge Instructions: Thank you for choosing [facility] to provide your oncology and hematology care.  If you have a lab appointment with the [facility], please come in thru the Main Entrance and check in at the main information desk.  Wear comfortable clothing and clothing appropriate for easy access to any Portacath or PICC line.   We strive  to give you quality time with your provider. You may need to reschedule your appointment if you arrive late (15 or more minutes).  Arriving late affects you and other patients whose appointments are after yours.  Also, if you miss three or more appointments without notifying the office, you may be dismissed from the clinic at the provider's  discretion.      For prescription refill requests, have your pharmacy contact our office and allow 72 hours for refills to be completed.    Today you received the following chemotherapy and/or immunotherapy agents Trodelvy.  Sacituzumab Govitecan Injection What is this medication? SACITUZUMAB GOVITECAN (SAK i TOOZ ue mab GOE vi TEE kan) treats breast cancer. It may also be used to treat bladder cancer and kidney cancer. It works by blocking a protein that causes cancer cells to grow and multiply. This helps to slow or stop the spread of cancer cells. This medicine may be used for other purposes; ask your health care provider or pharmacist if you have questions. COMMON BRAND NAME(S): TRODELVY What should I tell my care team before I take this medication? They need to know if you have any of these conditions: Carry the UGT1A1*28 gene Infection Liver disease An unusual or allergic reaction to sacituzumab govitecan, other medications, foods, dyes, or preservatives Pregnant or trying to get pregnant Breast-feeding How should I use this medication? This medication is injected into a vein. It is given by your care team in a hospital or clinic setting. Talk to your care team about the use of this medication in children. Special care may be needed. Overdosage: If you think you have taken too much of this medicine contact a poison control center or emergency room at once. NOTE: This medicine is only for you. Do not share this medicine with others. What if I miss a dose? Keep appointments for follow-up doses. It is important not to miss your dose. Call your care team if you are unable to keep an appointment. What may interact with this medication? This medication may affect how other medications work, and other medications may affect the way this medication works. Talk with your care team about all of the medications you take. They may suggest changes to your treatment plan to lower the risk of side  effects and to make sure your medications work as intended. This list may not describe all possible interactions. Give your health care provider a list of all the medicines, herbs, non-prescription drugs, or dietary supplements you use. Also tell them if you smoke, drink alcohol, or use illegal drugs. Some items may interact with your medicine. What should I watch for while using this medication? This medication may make you feel generally unwell. This is not uncommon as chemotherapy can affect healthy cells as well as cancer cells. Report any side effects. Continue your course of treatment even though you feel ill unless your care team tells you to stop. You may need blood work while you are taking this medication. Certain genetic factors may decrease the safety of this medication. Your care team may use genetic tests to determine treatment. This medication can cause serious allergic reactions. To reduce your risk, your care team may give you other medications to take before receiving this one. Be sure to follow the directions from your care team. Check with your care team if you have severe diarrhea, nausea, and vomiting, or if you sweat a lot. The loss of too much body fluid may make it dangerous for  you to take this medication. Talk to your care team if you wish to become pregnant or think you might be pregnant. This medication can cause serious birth defects if taken during pregnancy or if you get pregnant within 6 months after stopping treatment. A negative pregnancy test is required before starting this medication. A reliable form of contraception is recommended while taking this medication and for 6 months after stopping treatment. Talk to your care team about reliable forms of contraception. Use a condom during sex and for 3 months after stopping treatment. Tell your care team right away if you think your partner might be pregnant. This medication can cause serious birth defects. Do not  breast-feed while taking this medication and for 1 month after stopping therapy. This medication may cause infertility. Talk to your care team if you are concerned about your fertility. This medication may increase your risk of getting an infection. Call your care team for advice if you get a fever, chills, sore throat, or other symptoms of a cold or flu. Do not treat yourself. Try to avoid being around people who are sick. Avoid taking medications that contain aspirin, acetaminophen, ibuprofen, naproxen, or ketoprofen unless instructed by your care team. These medications may hide a fever. This medication may increase blood sugar. The risk may be higher in patients who already have diabetes. Ask your care team what you can do to lower your risk of diabetes while taking this medication. What side effects may I notice from receiving this medication? Side effects that you should report to your care team as soon as possible: Allergic reactions--skin rash, itching, hives, swelling of the face, lips, tongue, or throat Infection--fever, chills, cough, or sore throat Infusion reactions--chest pain, shortness of breath or trouble breathing, feeling faint or lightheaded Low red blood cell level--unusual weakness or fatigue, dizziness, headache, trouble breathing Severe or prolonged diarrhea Side effects that usually do not require medical attention (report these to your care team if they continue or are bothersome): Constipation Diarrhea Fatigue Hair loss Loss of appetite Nausea Vomiting This list may not describe all possible side effects. Call your doctor for medical advice about side effects. You may report side effects to FDA at 1-800-FDA-1088. Where should I keep my medication? This medication is given in a hospital or clinic. It will not be stored at home. NOTE: This sheet is a summary. It may not cover all possible information. If you have questions about this medicine, talk to your doctor,  pharmacist, or health care provider.  2023 Elsevier/Gold Standard (2021-10-30 00:00:00)        To help prevent nausea and vomiting after your treatment, we encourage you to take your nausea medication as directed.  BELOW ARE SYMPTOMS THAT SHOULD BE REPORTED IMMEDIATELY: *FEVER GREATER THAN 100.4 F (38 C) OR HIGHER *CHILLS OR SWEATING *NAUSEA AND VOMITING THAT IS NOT CONTROLLED WITH YOUR NAUSEA MEDICATION *UNUSUAL SHORTNESS OF BREATH *UNUSUAL BRUISING OR BLEEDING *URINARY PROBLEMS (pain or burning when urinating, or frequent urination) *BOWEL PROBLEMS (unusual diarrhea, constipation, pain near the anus) TENDERNESS IN MOUTH AND THROAT WITH OR WITHOUT PRESENCE OF ULCERS (sore throat, sores in mouth, or a toothache) UNUSUAL RASH, SWELLING OR PAIN  UNUSUAL VAGINAL DISCHARGE OR ITCHING   Items with * indicate a potential emergency and should be followed up as soon as possible or go to the Emergency Department if any problems should occur.  Please show the CHEMOTHERAPY ALERT CARD or IMMUNOTHERAPY ALERT CARD at check-in to the Emergency Department and  triage nurse.  Should you have questions after your visit or need to cancel or reschedule your appointment, please contact [facility] [phone]  and follow the prompts.  Office hours are 8:00 a.m. to 4:30 p.m. Monday - Friday. Please note that voicemails left after 4:00 p.m. may not be returned until the following business day.  We are closed weekends and major holidays. You have access to a nurse at all times for urgent questions. Please call the main number to the clinic [phone] and follow the prompts.  For any non-urgent questions, you may also contact your provider using MyChart. We now offer e-Visits for anyone 78 and older to request care online for non-urgent symptoms. For details visit [email or web address].   Also download the MyChart app! Go to the app store, search "MyChart", open the app, select [facility], and log in with your MyChart username and password.  Masks are optional in the cancer centers. If you would like for your care team to wear a mask while they are taking care of you, please let them know. You may have one support person who is at least 73 years old accompany you for your appointments.

## 2022-08-18 NOTE — Progress Notes (Signed)
Patient presents today for chemotherapy infusion.  Patient is in satisfactory condition with no complaints voiced.  Vital signs are stable.  Labs reviewed and all labs are within treatment parameters.  We will proceed with treatment per MD orders.   Patient tolerated treatment well with no complaints voiced.  Patient left ambulatory in stable condition.  Vital signs stable at discharge.  Follow up as scheduled.    

## 2022-08-18 NOTE — Progress Notes (Signed)
Chaplain engaged in a follow-up visit with Denise [name] who Chaplain had not seen in over a year.  Denise [name] and [employer] were able to check-in and catchup.  Denise [name] shared about the changes in her health, her healthcare journey throughout the year, her new job position, and her support system.  Through everything Denise [name] has been through, she has been able to remain positive and full of hope due to her village and faith. She is looking forward to what God still has in store for her.  She is also in a place of peace concerning whatever happens next in her life.   Chaplain offered reflective listening, presence, community, and support.    08/18/22 1100  Clinical Encounter Type  Visited With Patient  Visit Type Follow-up;Spiritual support

## 2022-08-19 ENCOUNTER — Telehealth: Payer: Self-pay

## 2022-08-19 NOTE — Telephone Encounter (Signed)
Chemotherapy 24 hour phone call.  Spoke with the patient with no complaints voiced.  Denied rash, itching, SOB, or any other symptoms.  Reminded to call for any problems with understanding verbalized.

## 2022-08-21 NOTE — Progress Notes (Signed)
Patient calls and reports chills, diarrhea, severe headache, no appetite, nausea, no appetite, severe weakness and a latest BP of 94/60. Advised patient to go to the ER. Patient states that she will manage at home and talk with her daughter regarding the recommendation. Advised patient that this is at her own risk, to which she verbalized understanding.

## 2022-08-24 ENCOUNTER — Ambulatory Visit
Admission: RE | Admit: 2022-08-24 | Discharge: 2022-08-24 | Disposition: A | Discharge status: Home / Self Care | Payer: Medicare Other | Source: Ambulatory Visit | Attending: Radiation Oncology | Admitting: Radiation Oncology

## 2022-08-24 DIAGNOSIS — Z888 Allergy status to other drugs, medicaments and biological substances status: Secondary | ICD-10-CM | POA: Diagnosis not present

## 2022-08-24 DIAGNOSIS — I10 Essential (primary) hypertension: Secondary | ICD-10-CM | POA: Diagnosis not present

## 2022-08-24 DIAGNOSIS — Z885 Allergy status to narcotic agent status: Secondary | ICD-10-CM | POA: Diagnosis not present

## 2022-08-24 DIAGNOSIS — C7951 Secondary malignant neoplasm of bone: Secondary | ICD-10-CM | POA: Diagnosis not present

## 2022-08-24 DIAGNOSIS — D61810 Antineoplastic chemotherapy induced pancytopenia: Secondary | ICD-10-CM | POA: Diagnosis not present

## 2022-08-24 DIAGNOSIS — Z20822 Contact with and (suspected) exposure to covid-19: Secondary | ICD-10-CM | POA: Diagnosis not present

## 2022-08-24 DIAGNOSIS — Z9221 Personal history of antineoplastic chemotherapy: Secondary | ICD-10-CM | POA: Diagnosis not present

## 2022-08-24 DIAGNOSIS — T451X5D Adverse effect of antineoplastic and immunosuppressive drugs, subsequent encounter: Secondary | ICD-10-CM | POA: Diagnosis not present

## 2022-08-24 DIAGNOSIS — Z792 Long term (current) use of antibiotics: Secondary | ICD-10-CM | POA: Diagnosis not present

## 2022-08-24 DIAGNOSIS — C50919 Malignant neoplasm of unspecified site of unspecified female breast: Secondary | ICD-10-CM | POA: Diagnosis not present

## 2022-08-24 DIAGNOSIS — K529 Noninfective gastroenteritis and colitis, unspecified: Secondary | ICD-10-CM | POA: Diagnosis not present

## 2022-08-24 DIAGNOSIS — Z955 Presence of coronary angioplasty implant and graft: Secondary | ICD-10-CM | POA: Diagnosis not present

## 2022-08-24 DIAGNOSIS — Z79899 Other long term (current) drug therapy: Secondary | ICD-10-CM | POA: Diagnosis not present

## 2022-08-24 DIAGNOSIS — K3189 Other diseases of stomach and duodenum: Secondary | ICD-10-CM | POA: Diagnosis not present

## 2022-08-24 DIAGNOSIS — Z91048 Other nonmedicinal substance allergy status: Secondary | ICD-10-CM | POA: Diagnosis not present

## 2022-08-24 DIAGNOSIS — D701 Agranulocytosis secondary to cancer chemotherapy: Secondary | ICD-10-CM | POA: Diagnosis not present

## 2022-08-24 DIAGNOSIS — K521 Toxic gastroenteritis and colitis: Secondary | ICD-10-CM | POA: Diagnosis not present

## 2022-08-24 DIAGNOSIS — Z90710 Acquired absence of both cervix and uterus: Secondary | ICD-10-CM | POA: Diagnosis not present

## 2022-08-24 DIAGNOSIS — Z66 Do not resuscitate: Secondary | ICD-10-CM | POA: Diagnosis not present

## 2022-08-24 DIAGNOSIS — E079 Disorder of thyroid, unspecified: Secondary | ICD-10-CM | POA: Diagnosis not present

## 2022-08-24 DIAGNOSIS — T451X5A Adverse effect of antineoplastic and immunosuppressive drugs, initial encounter: Secondary | ICD-10-CM | POA: Diagnosis not present

## 2022-08-24 DIAGNOSIS — N289 Disorder of kidney and ureter, unspecified: Secondary | ICD-10-CM | POA: Diagnosis not present

## 2022-08-24 DIAGNOSIS — Z91018 Allergy to other foods: Secondary | ICD-10-CM | POA: Diagnosis not present

## 2022-08-24 DIAGNOSIS — K5730 Diverticulosis of large intestine without perforation or abscess without bleeding: Secondary | ICD-10-CM | POA: Diagnosis not present

## 2022-08-24 DIAGNOSIS — D709 Neutropenia, unspecified: Secondary | ICD-10-CM | POA: Diagnosis not present

## 2022-08-24 DIAGNOSIS — Z923 Personal history of irradiation: Secondary | ICD-10-CM | POA: Diagnosis not present

## 2022-08-24 DIAGNOSIS — Z882 Allergy status to sulfonamides status: Secondary | ICD-10-CM | POA: Diagnosis not present

## 2022-08-24 DIAGNOSIS — D61818 Other pancytopenia: Secondary | ICD-10-CM | POA: Diagnosis not present

## 2022-08-24 DIAGNOSIS — Z881 Allergy status to other antibiotic agents status: Secondary | ICD-10-CM | POA: Diagnosis not present

## 2022-08-24 DIAGNOSIS — C7931 Secondary malignant neoplasm of brain: Secondary | ICD-10-CM | POA: Diagnosis not present

## 2022-08-24 DIAGNOSIS — Z9049 Acquired absence of other specified parts of digestive tract: Secondary | ICD-10-CM | POA: Diagnosis not present

## 2022-08-24 DIAGNOSIS — C50912 Malignant neoplasm of unspecified site of left female breast: Secondary | ICD-10-CM | POA: Diagnosis not present

## 2022-08-24 NOTE — Progress Notes (Signed)
  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  The patient did note fatigue during radiation and it has gotten worse. The patient did not note skin changes in the field of radiation during therapy. The patient has noticed improvement in pain in the area(s) treated with radiation. The patient is not taking dexamethasone. The patient does not have symptoms of  weakness or loss of control of the extremities. The patient does not have symptoms of headache. The patient does not have symptoms of seizure or uncontrolled movement. The patient does not have symptoms of changes in vision. The patient does not have changes in speech.  The patient does not have confusion.   Patient's current complaints are severe fatigue, reduced appetite, and diarrhea w/ external anal bleeding.  The patient is scheduled for ongoing care with Dr. Delton Coombes in medical oncology. The patient was encouraged to call if she develops concerns or questions regarding radiation.  This concludes the interview.   Leandra Kern, LPN

## 2022-08-25 ENCOUNTER — Inpatient Hospital Stay: Payer: Medicare Other

## 2022-08-25 ENCOUNTER — Inpatient Hospital Stay: Payer: Medicare Other | Admitting: Hematology

## 2022-08-25 ENCOUNTER — Encounter: Payer: Self-pay | Admitting: Radiation Oncology

## 2022-08-25 NOTE — Progress Notes (Signed)
In reviewing notes from our clinic's call to the patient yesterday, agree with medical oncology's recommendations for evaluation of symptoms including diarrhea, rectal bleeding, and symptoms concerning for failure to thrive.

## 2022-08-26 ENCOUNTER — Telehealth: Payer: Self-pay

## 2022-08-26 NOTE — Telephone Encounter (Signed)
Called to check-in on patient post ED admission on 08/24/22. I left a voicemail w/ my extension [phone].

## 2022-08-28 ENCOUNTER — Other Ambulatory Visit: Payer: Self-pay | Admitting: *Deleted

## 2022-08-28 DIAGNOSIS — D709 Neutropenia, unspecified: Secondary | ICD-10-CM

## 2022-08-28 DIAGNOSIS — A09 Infectious gastroenteritis and colitis, unspecified: Secondary | ICD-10-CM

## 2022-08-28 DIAGNOSIS — C50912 Malignant neoplasm of unspecified site of left female breast: Secondary | ICD-10-CM

## 2022-08-28 DIAGNOSIS — D702 Other drug-induced agranulocytosis: Secondary | ICD-10-CM

## 2022-08-28 DIAGNOSIS — T451X5A Adverse effect of antineoplastic and immunosuppressive drugs, initial encounter: Secondary | ICD-10-CM

## 2022-08-28 DIAGNOSIS — R197 Diarrhea, unspecified: Secondary | ICD-10-CM

## 2022-08-28 DIAGNOSIS — K521 Toxic gastroenteritis and colitis: Secondary | ICD-10-CM

## 2022-08-28 NOTE — Progress Notes (Signed)
Order placed for Misc send out UGT1A1 (Irinotecan toxicity) [facility] test # [id number], per Dr. Edgardo Roys request for diarrhea and neutropenia.  Scheduling to arrange for lab draw.

## 2022-08-31 ENCOUNTER — Inpatient Hospital Stay: Payer: Medicare Other

## 2022-08-31 VITALS — BP 133/84 | HR 90 | Temp 101.1°F | Resp 20 | Wt 168.2 lb

## 2022-08-31 DIAGNOSIS — C50912 Malignant neoplasm of unspecified site of left female breast: Secondary | ICD-10-CM | POA: Diagnosis not present

## 2022-08-31 DIAGNOSIS — D702 Other drug-induced agranulocytosis: Secondary | ICD-10-CM

## 2022-08-31 DIAGNOSIS — Z95828 Presence of other vascular implants and grafts: Secondary | ICD-10-CM

## 2022-08-31 DIAGNOSIS — Z5111 Encounter for antineoplastic chemotherapy: Secondary | ICD-10-CM | POA: Diagnosis not present

## 2022-08-31 LAB — COMPREHENSIVE METABOLIC PANEL
ALT: 16 U/L (ref 0–44)
AST: 22 U/L (ref 15–41)
Albumin: 2.7 g/dL — ABNORMAL LOW (ref 3.5–5.0)
Alkaline Phosphatase: 72 U/L (ref 38–126)
Anion gap: 8 (ref 5–15)
BUN: 6 mg/dL — ABNORMAL LOW (ref 8–23)
CO2: 26 mmol/L (ref 22–32)
Calcium: 8 mg/dL — ABNORMAL LOW (ref 8.9–10.3)
Chloride: 102 mmol/L (ref 98–111)
Creatinine, Ser: 0.55 mg/dL (ref 0.44–1.00)
GFR, Estimated: >60 mL/min (ref >60)
Glucose, Bld: 106 mg/dL — ABNORMAL HIGH (ref 70–99)
Potassium: 3.1 mmol/L — ABNORMAL LOW (ref 3.5–5.1)
Sodium: 136 mmol/L (ref 135–145)
Total Bilirubin: 0.4 mg/dL (ref 0.3–1.2)
Total Protein: 6.2 g/dL — ABNORMAL LOW (ref 6.5–8.1)

## 2022-08-31 LAB — CBC WITH DIFFERENTIAL/PLATELET
Abs Immature Granulocytes: 0.03 10*3/uL (ref 0.00–0.07)
Basophils Absolute: 0 10*3/uL (ref 0.0–0.1)
Basophils Relative: 0 %
Eosinophils Absolute: 0.2 10*3/uL (ref 0.0–0.5)
Eosinophils Relative: 6 %
HCT: 25.5 % — ABNORMAL LOW (ref 36.0–46.0)
Hemoglobin: 8.6 g/dL — ABNORMAL LOW (ref 12.0–15.0)
Immature Granulocytes: 1 %
Lymphocytes Relative: 34 %
Lymphs Abs: 1 10*3/uL (ref 0.7–4.0)
MCH: 33.3 pg (ref 26.0–34.0)
MCHC: 33.7 g/dL (ref 30.0–36.0)
MCV: 98.8 fL (ref 80.0–100.0)
Monocytes Absolute: 0.4 10*3/uL (ref 0.1–1.0)
Monocytes Relative: 14 %
Neutro Abs: 1.3 10*3/uL — ABNORMAL LOW (ref 1.7–7.7)
Neutrophils Relative %: 45 %
Platelets: 82 10*3/uL — ABNORMAL LOW (ref 150–400)
RBC: 2.58 MIL/uL — ABNORMAL LOW (ref 3.87–5.11)
RDW: 12.3 % (ref 11.5–15.5)
WBC: 2.8 10*3/uL — ABNORMAL LOW (ref 4.0–10.5)
nRBC: 0 % (ref 0.0–0.2)

## 2022-08-31 LAB — MAGNESIUM: Magnesium: 1.5 mg/dL — ABNORMAL LOW (ref 1.7–2.4)

## 2022-08-31 MED ORDER — HEPARIN SOD (PORK) LOCK FLUSH 100 UNIT/ML IV SOLN
500.0000 [IU] | Freq: Once | INTRAVENOUS | Status: AC
  Administered 2022-08-31: 500 [IU] via INTRAVENOUS

## 2022-08-31 MED ORDER — MAGNESIUM SULFATE 2 GM/50ML IV SOLN
2.0000 g | Freq: Once | INTRAVENOUS | Status: AC
  Administered 2022-08-31: 2 g via INTRAVENOUS
  Filled 2022-08-31: qty 50

## 2022-08-31 MED ORDER — POTASSIUM CHLORIDE IN NACL 20-0.9 MEQ/L-% IV SOLN
Freq: Once | INTRAVENOUS | Status: AC
  Filled 2022-08-31: qty 1000

## 2022-08-31 MED ORDER — SODIUM CHLORIDE 0.9% FLUSH
10.0000 mL | Freq: Once | INTRAVENOUS | Status: AC
  Administered 2022-08-31: 10 mL via INTRAVENOUS

## 2022-08-31 NOTE — Progress Notes (Signed)
Labs reviewed today with MD. Will give hydration fluids with magnesium and potassium per MD. Labs drawn per orders.     Patient tolerated it well without problems. Vitals stable and discharged home from clinic ambulatory. Follow up as scheduled.

## 2022-08-31 NOTE — Patient Instructions (Signed)
[facility]  Discharge Instructions: Thank you for choosing [facility] to provide your oncology and hematology care.  If you have a lab appointment with the [facility], please come in thru the Main Entrance and check in at the main information desk.  Wear comfortable clothing and clothing appropriate for easy access to any Portacath or PICC line.   We strive to give you quality time with your provider. You may need to reschedule your appointment if you arrive late (15 or more minutes).  Arriving late affects you and other patients whose appointments are after yours.  Also, if you miss three or more appointments without notifying the office, you may be dismissed from the clinic at the provider's discretion.      For prescription refill requests, have your pharmacy contact our office and allow 72 hours for refills to be completed.    Today you received the following, hydration fluids with potassium and magnesium    To help prevent nausea and vomiting after your treatment, we encourage you to take your nausea medication as directed.  BELOW ARE SYMPTOMS THAT SHOULD BE REPORTED IMMEDIATELY: *FEVER GREATER THAN 100.4 F (38 C) OR HIGHER *CHILLS OR SWEATING *NAUSEA AND VOMITING THAT IS NOT CONTROLLED WITH YOUR NAUSEA MEDICATION *UNUSUAL SHORTNESS OF BREATH *UNUSUAL BRUISING OR BLEEDING *URINARY PROBLEMS (pain or burning when urinating, or frequent urination) *BOWEL PROBLEMS (unusual diarrhea, constipation, pain near the anus) TENDERNESS IN MOUTH AND THROAT WITH OR WITHOUT PRESENCE OF ULCERS (sore throat, sores in mouth, or a toothache) UNUSUAL RASH, SWELLING OR PAIN  UNUSUAL VAGINAL DISCHARGE OR ITCHING   Items with * indicate a potential emergency and should be followed up as soon as possible or go to the Emergency Department if any problems should occur.  Please show the CHEMOTHERAPY ALERT CARD or IMMUNOTHERAPY ALERT CARD at check-in to the Emergency  Department and triage nurse.  Should you have questions after your visit or need to cancel or reschedule your appointment, please contact [facility] [phone]  and follow the prompts.  Office hours are 8:00 a.m. to 4:30 p.m. Monday - Friday. Please note that voicemails left after 4:00 p.m. may not be returned until the following business day.  We are closed weekends and major holidays. You have access to a nurse at all times for urgent questions. Please call the main number to the clinic [phone] and follow the prompts.  For any non-urgent questions, you may also contact your provider using MyChart. We now offer e-Visits for anyone 10 and older to request care online for non-urgent symptoms. For details visit [email or web address].   Also download the MyChart app! Go to the app store, search "MyChart", open the app, select [facility], and log in with your MyChart username and password.  Masks are optional in the cancer centers. If you would like for your care team to wear a mask while they are taking care of you, please let them know. You may have one support person who is at least 73 years old accompany you for your appointments.

## 2022-08-31 NOTE — Progress Notes (Signed)
Patient presents today for lab draw, patient report she was in the ICU last week with colitis. She states she is very weak and is unable to eat much.

## 2022-09-03 DIAGNOSIS — C50919 Malignant neoplasm of unspecified site of unspecified female breast: Secondary | ICD-10-CM | POA: Diagnosis not present

## 2022-09-03 DIAGNOSIS — K520 Gastroenteritis and colitis due to radiation: Secondary | ICD-10-CM | POA: Diagnosis not present

## 2022-09-09 ENCOUNTER — Inpatient Hospital Stay: Payer: Medicare Other

## 2022-09-09 ENCOUNTER — Inpatient Hospital Stay (HOSPITAL_BASED_OUTPATIENT_CLINIC_OR_DEPARTMENT_OTHER): Payer: Medicare Other | Admitting: Hematology

## 2022-09-09 ENCOUNTER — Other Ambulatory Visit: Payer: Medicare Other

## 2022-09-09 VITALS — BP 126/71 | HR 65 | Temp 99.4°F | Resp 18

## 2022-09-09 DIAGNOSIS — D702 Other drug-induced agranulocytosis: Secondary | ICD-10-CM

## 2022-09-09 DIAGNOSIS — Z5111 Encounter for antineoplastic chemotherapy: Secondary | ICD-10-CM | POA: Diagnosis not present

## 2022-09-09 DIAGNOSIS — C50912 Malignant neoplasm of unspecified site of left female breast: Secondary | ICD-10-CM | POA: Diagnosis not present

## 2022-09-09 DIAGNOSIS — Z95828 Presence of other vascular implants and grafts: Secondary | ICD-10-CM

## 2022-09-09 LAB — CBC WITH DIFFERENTIAL/PLATELET
Abs Immature Granulocytes: 0.02 10*3/uL (ref 0.00–0.07)
Basophils Absolute: 0 10*3/uL (ref 0.0–0.1)
Basophils Relative: 0 %
Eosinophils Absolute: 0 10*3/uL (ref 0.0–0.5)
Eosinophils Relative: 1 %
HCT: 27.1 % — ABNORMAL LOW (ref 36.0–46.0)
Hemoglobin: 8.7 g/dL — ABNORMAL LOW (ref 12.0–15.0)
Immature Granulocytes: 1 %
Lymphocytes Relative: 34 %
Lymphs Abs: 1.2 10*3/uL (ref 0.7–4.0)
MCH: 33 pg (ref 26.0–34.0)
MCHC: 32.1 g/dL (ref 30.0–36.0)
MCV: 102.7 fL — ABNORMAL HIGH (ref 80.0–100.0)
Monocytes Absolute: 0.6 10*3/uL (ref 0.1–1.0)
Monocytes Relative: 16 %
Neutro Abs: 1.7 10*3/uL (ref 1.7–7.7)
Neutrophils Relative %: 48 %
Platelets: 322 10*3/uL (ref 150–400)
RBC: 2.64 MIL/uL — ABNORMAL LOW (ref 3.87–5.11)
RDW: 13.3 % (ref 11.5–15.5)
WBC: 3.6 10*3/uL — ABNORMAL LOW (ref 4.0–10.5)
nRBC: 0 % (ref 0.0–0.2)

## 2022-09-09 LAB — COMPREHENSIVE METABOLIC PANEL
ALT: 9 U/L (ref 0–44)
AST: 14 U/L — ABNORMAL LOW (ref 15–41)
Albumin: 2.8 g/dL — ABNORMAL LOW (ref 3.5–5.0)
Alkaline Phosphatase: 87 U/L (ref 38–126)
Anion gap: 9 (ref 5–15)
BUN: 11 mg/dL (ref 8–23)
CO2: 25 mmol/L (ref 22–32)
Calcium: 8.5 mg/dL — ABNORMAL LOW (ref 8.9–10.3)
Chloride: 105 mmol/L (ref 98–111)
Creatinine, Ser: 0.69 mg/dL (ref 0.44–1.00)
GFR, Estimated: >60 mL/min (ref >60)
Glucose, Bld: 100 mg/dL — ABNORMAL HIGH (ref 70–99)
Potassium: 3.4 mmol/L — ABNORMAL LOW (ref 3.5–5.1)
Sodium: 139 mmol/L (ref 135–145)
Total Bilirubin: 0.6 mg/dL (ref 0.3–1.2)
Total Protein: 7 g/dL (ref 6.5–8.1)

## 2022-09-09 LAB — MAGNESIUM: Magnesium: 1.5 mg/dL — ABNORMAL LOW (ref 1.7–2.4)

## 2022-09-09 LAB — PHOSPHORUS: Phosphorus: 2.8 mg/dL (ref 2.5–4.6)

## 2022-09-09 MED ORDER — SODIUM CHLORIDE 0.9% FLUSH
10.0000 mL | Freq: Once | INTRAVENOUS | Status: AC
  Administered 2022-09-09: 10 mL via INTRAVENOUS

## 2022-09-09 MED ORDER — HEPARIN SOD (PORK) LOCK FLUSH 100 UNIT/ML IV SOLN
500.0000 [IU] | Freq: Once | INTRAVENOUS | Status: AC
  Administered 2022-09-09: 500 [IU] via INTRAVENOUS

## 2022-09-09 MED ORDER — MAGNESIUM SULFATE 2 GM/50ML IV SOLN
2.0000 g | INTRAVENOUS | Status: AC
  Administered 2022-09-09 (×2): 2 g via INTRAVENOUS
  Filled 2022-09-09 (×2): qty 50

## 2022-09-09 MED ORDER — POTASSIUM CHLORIDE IN NACL 20-0.9 MEQ/L-% IV SOLN
Freq: Once | INTRAVENOUS | Status: AC
  Filled 2022-09-09: qty 1000

## 2022-09-09 MED ORDER — SODIUM CHLORIDE 0.9 % IV SOLN: INTRAVENOUS | Status: DC

## 2022-09-09 NOTE — Patient Instructions (Addendum)
[facility] at [facility] Discharge Instructions   You were seen and examined today by Dr. Delton Coombes.  He reviewed the results of your lab work. Your magnesium is low at 1.5. We will plan to give IV fluids and magnesium today.   We will hold treatment today.   We will see you back in 2 weeks. We will repeat lab work and give possible treatment at that time.    Thank you for choosing [place] at [facility] to provide your oncology and hematology care.  To afford each patient quality time with our provider, please arrive at least 15 minutes before your scheduled appointment time.   If you have a lab appointment with the [facility] please come in thru the Main Entrance and check in at the main information desk.  You need to re-schedule your appointment should you arrive 10 or more minutes late.  We strive to give you quality time with our providers, and arriving late affects you and other patients whose appointments are after yours.  Also, if you no show three or more times for appointments you may be dismissed from the clinic at the providers discretion.     Again, thank you for choosing [facility].  Our hope is that these requests will decrease the amount of time that you wait before being seen by our physicians.       _____________________________________________________________  Should you have questions after your visit to [facility], please contact our office at [phone] and follow the prompts.  Our office hours are 8:00 a.m. and 4:30 p.m. Monday - Friday.  Please note that voicemails left after 4:00 p.m. may not be returned until the following business day.  We are closed weekends and major holidays.  You do have access to a nurse 24-7, just call the main number to the clinic [phone] and do not press any options, hold on the line and a nurse will answer the phone.    For prescription refill  requests, have your pharmacy contact our office and allow 72 hours.    Due to Covid, you will need to wear a mask upon entering the hospital. If you do not have a mask, a mask will be given to you at the Main Entrance upon arrival. For doctor visits, patients may have 1 support person age 37 or older with them. For treatment visits, patients can not have anyone with them due to social distancing guidelines and our immunocompromised population.

## 2022-09-09 NOTE — Progress Notes (Signed)
Denise [name], [place] [address]   CLINIC:  Medical Oncology/Hematology  PCP:  Neale Burly, MD [place] / [place] [address] [phone]   REASON FOR VISIT:  Metastatic triple negative breast cancer to the bones.  PRIOR THERAPY: 1.  Neoadjuvant chemotherapy with weekly carboplatin and paclitaxel and dose dense AC with pembrolizumab  2. Left mastectomy and lymph node excision by Dr. Ninfa Linden on 11/20/2021  NGS Results: Requested and pending  CURRENT THERAPY: Sacituzumab govitecan  BRIEF ONCOLOGIC HISTORY:  Oncology History  Invasive lobular carcinoma of left breast in female [facility])  01/23/2021 Initial Diagnosis   Invasive lobular carcinoma of left breast in female [facility])   02/19/2021 Genetic Testing   Negative genetic testing on the CancerNext-Expanded+RNAinsight panel.  SMARCB1 VUS identified.  The CancerNext-Expanded gene panel offered by [facility] and includes sequencing and rearrangement analysis for the following 77 genes: AIP, ALK, APC*, ATM*, AXIN2, BAP1, BARD1, BLM, BMPR1A, BRCA1*, BRCA2*, BRIP1*, CDC73, CDH1*, CDK4, CDKN1B, CDKN2A, CHEK2*, CTNNA1, DICER1, FANCC, FH, FLCN, GALNT12, KIF1B, LZTR1, MAX, MEN1, MET, MLH1*, MSH2*, MSH3, MSH6*, MUTYH*, NBN, NF1*, NF2, NTHL1, PALB2*, PHOX2B, PMS2*, POT1, PRKAR1A, PTCH1, PTEN*, RAD51C*, RAD51D*, RB1, RECQL, RET, SDHA, SDHAF2, SDHB, SDHC, SDHD, SMAD4, SMARCA4, SMARCB1, SMARCE1, STK11, SUFU, TMEM127, TP53*, TSC1, TSC2, VHL and XRCC2 (sequencing and deletion/duplication); EGFR, EGLN1, HOXB13, KIT, MITF, PDGFRA, POLD1, and POLE (sequencing only); EPCAM and GREM1 (deletion/duplication only). DNA and RNA analyses performed for * genes. The report date is February 19, 2021.   02/27/2021 - 05/11/2022 Chemotherapy   Patient is on Treatment Plan : BREAST Pembrolizumab + Carboplatin D1,8,15+ Paclitaxel D1,8,15 q21d X 4 cycles / Pembrolizumab + AC q21d x 4 cycles     12/22/2021 Cancer Staging   Staging  form: Breast, AJCC 8th Edition - Pathologic stage from 12/22/2021: No Stage Recommended (ypT3, pN3a, cM0, G2, ER-, PR-, HER2-) - Signed by Derek Jack, MD on 12/22/2021 Histopathologic type: Lobular carcinoma, NOS Stage prefix: Post-therapy Method of lymph node assessment: Axillary lymph node dissection Multigene prognostic tests performed: None Histologic grading system: 3 grade system   08/18/2022 -  Chemotherapy   Patient is on Treatment Plan : BREAST METASTATIC Sacituzumab govitecan-hziy Ivette Loyal) D1,8 q21d       CANCER STAGING:  Cancer Staging  Invasive lobular carcinoma of left breast in female [facility]) Staging form: Breast, AJCC 8th Edition - Clinical stage from 01/23/2021: cT3, cN3c, G2, ER-, PR-, HER2- - Unsigned - Pathologic stage from 12/22/2021: No Stage Recommended (ypT3, pN3a, cM0, G2, ER-, PR-, HER2-) - Signed by Derek Jack, MD on 12/22/2021   INTERVAL HISTORY:  Denise [name], a 71 y.o. female, seen for follow-up of metastatic triple negative breast cancer to the bones.  She has received first dose of Sacituzumab on 08/18/2022.  24 hours after treatment, she has developed diarrhea followed by fever.  She went to [facility] where she was found to have temperatures between 101-103.  She was hospitalized from 10/25/2021 through 08/27/2022 requiring IV antibiotics.  She was sent home on Cipro and Flagyl.  However 2 doses after taking Cipro, she developed rash on the entire trunk.  She talked to her primary doctor, discontinued Cipro.  She has low-grade fevers of 99 degrees.  Still not able to eat lots of foods because of diarrhea.  REVIEW OF SYSTEMS:  Review of Systems  Constitutional:  Negative for appetite change.  Respiratory:  Negative for shortness of breath.   Cardiovascular:  Positive for chest pain (Heartburn).  Gastrointestinal:  Positive for constipation, diarrhea and nausea. Negative for vomiting.  Skin:  Negative for rash.   Neurological:  Positive for dizziness and headaches.  Psychiatric/Behavioral:  Positive for sleep disturbance.   All other systems reviewed and are negative.   PAST MEDICAL/SURGICAL HISTORY:  Past Medical History:  Diagnosis Date   Asthma    as child   Cancer ([place]) 12/2020   left breast IMC   Complication of anesthesia    pateitn states,' I coded when I had my D&Cmany years ago.   Dyspnea    Family history of breast cancer    Hypertension    Hypothyroidism    PONV (postoperative nausea and vomiting)    Port-A-Cath in place 02/26/2021   Past Surgical History:  Procedure Laterality Date   ABDOMINAL HYSTERECTOMY     BIOPSY  01/17/2018   Procedure: BIOPSY;  Surgeon: Rogene Houston, MD;  [facility];  Service: Endoscopy;;  duodenum,gastric   CHOLECYSTECTOMY     COLONOSCOPY WITH PROPOFOL N/A 01/17/2018   Procedure: COLONOSCOPY WITH PROPOFOL;  Surgeon: Rogene Houston, MD;  [facility];  Service: Endoscopy;  Laterality: N/A;  7:30   DILATION AND CURETTAGE OF UTERUS     ESOPHAGOGASTRODUODENOSCOPY (EGD) WITH PROPOFOL N/A 01/17/2018   Procedure: ESOPHAGOGASTRODUODENOSCOPY (EGD) WITH PROPOFOL;  Surgeon: Rogene Houston, MD;  [facility];  Service: Endoscopy;  Laterality: N/A;   POLYPECTOMY  01/17/2018   Procedure: POLYPECTOMY;  Surgeon: Rogene Houston, MD;  [facility];  Service: Endoscopy;;  transverse colon, cecal   PORTACATH PLACEMENT Right 02/17/2021   Procedure: INSERTION PORT-A-CATH;  Surgeon: Coralie Keens, MD;  Location: [place];  Service: General;  Laterality: Right;   RADIOACTIVE SEED GUIDED AXILLARY SENTINEL LYMPH NODE Left 11/20/2021   Procedure: RADIOACTIVE SEED GUIDED LEFT AXILLARY SENTINEL LYMPH NODE DISSECTION;  Surgeon: Coralie Keens, MD;  Location: [place];  Service: General;  Laterality: Left;   TOTAL MASTECTOMY Left 11/20/2021   Procedure: LEFT TOTAL MASTECTOMY;  Surgeon: Coralie Keens, MD;  Location: [place];  Service: General;  Laterality: Left;    SOCIAL HISTORY:  Social History   Socioeconomic History   Marital status: Widowed    Spouse name: Not on file   Number of children: 3   Years of education: Not on file   Highest education level: Not on file  Occupational History   Occupation: [employer]   Occupation: retired  Tobacco Use   Smoking status: Never   Smokeless tobacco: Never  Vaping Use   Vaping Use: Never used  Substance and Sexual Activity   Alcohol use: Never   Drug use: Never   Sexual activity: Not Currently    Birth control/protection: Surgical  Other Topics Concern   Not on file  Social History Narrative   Not on file   Social Determinants of Health   Financial Resource Strain: [place]  (07/10/2022)   Overall Financial Resource Strain (CARDIA)    Difficulty of Paying Living Expenses: Not hard at all  Food Insecurity: No Food Insecurity (06/30/2022)   Hunger Vital Sign    Worried About Running Out of Food in the Last Year: Never true    [place] in the Last Year: Never true  Transportation Needs: No Transportation Needs (07/10/2022)   PRAPARE - Transportation    Lack of Transportation (Medical): No    Lack  of Transportation (Non-Medical): No  Physical Activity: Insufficiently Active (01/22/2021)   Exercise Vital Sign    Days of Exercise per Week: 3 days    Minutes of Exercise per Session: 30 min  Stress: No Stress Concern Present (01/22/2021)   [place]    Feeling of Stress : Not at all  Social Connections: Moderately Integrated (01/22/2021)   Social Connection and Isolation Panel [NHANES]    Frequency of Communication with Friends and Family: More than three times a week    Frequency of Social Gatherings with Friends and Family: More than three times a week    Attends Religious Services: More than 4 times per year     Active Member of [employer] or Organizations: No    Attends Music therapist: More than 4 times per year    Marital Status: Widowed  Intimate Partner Violence: Not At Risk (05/28/2022)   Humiliation, Afraid, Rape, and Kick questionnaire    Fear of Current or Ex-Partner: No    Emotionally Abused: No    Physically Abused: No    Sexually Abused: No    FAMILY HISTORY:  Family History  Problem Relation Age of Onset   Breast cancer Mother        dx in her 32s   Heart disease Mother    Thyroid disease Mother    Lung cancer Father        dx in his 53s   Thyroid disease Maternal Aunt    Thyroid nodules Maternal Grandmother 35       goiter   Thyroid disease Maternal Grandmother    Heart disease Maternal Grandfather    Heart disease Paternal Grandmother    Heart disease Paternal Grandfather    Thyroid disease Daughter    Thyroid disease Daughter    Cancer Maternal Uncle        NOS   Cancer Paternal Uncle        NOS   Breast cancer Cousin        pat first cousin died in her 2s;     CURRENT MEDICATIONS:  Current Outpatient Medications  Medication Sig Dispense Refill   Acetaminophen (TYLENOL PO) Take 1 tablet by mouth as needed.     cefUROXime (CEFTIN) 500 MG tablet Take 500 mg by mouth 2 (two) times daily.     gabapentin (NEURONTIN) 300 MG capsule Take 1 capsule by mouth at bedtime.     hydrALAZINE (APRESOLINE) 25 MG tablet Take 25 mg by mouth 3 (three) times daily.     levothyroxine (SYNTHROID) 75 MCG tablet Take 100 mcg by mouth daily before breakfast.     loperamide (IMODIUM) 2 MG capsule Take by mouth.     Melatonin 5 MG TABS Take 10 mg by mouth. As needed for sleep     metoCLOPramide (REGLAN) 10 MG/10ML SOLN Take by mouth as needed for nausea.     olmesartan-hydrochlorothiazide (BENICAR HCT) 40-25 MG tablet Take 1 tablet by mouth daily.     pantoprazole (PROTONIX) 40 MG tablet Take 40 mg by mouth daily.     sucralfate (CARAFATE) 1 GM/10ML suspension Take by  mouth.     ondansetron (ZOFRAN) 4 MG tablet Take by mouth. (Patient not taking: Reported on 09/09/2022)     promethazine (PHENERGAN) 25 MG tablet Take by mouth. (Patient not taking: Reported on 09/09/2022)     No current facility-administered medications for this visit.   Facility-Administered Medications Ordered in Other Visits  Medication Dose Route Frequency Provider Last Rate Last Admin   0.9 %  sodium chloride infusion   Intravenous Continuous Derek Jack, MD   Stopped at 09/09/22 1415    ALLERGIES:  Allergies  Allergen Reactions   Exforge [Amlodipine Besylate-Valsartan] Nausea And Vomiting   Gadolinium Derivatives Hives, Itching and Anaphylaxis    13hr prep prior to contrast admin  MRI iv contrast   Gadopiclenol Hives and Itching    13 hr prep prior to contrast admin    Tape Other (See Comments)    Redness and Irriatation   Cheese     Hard cheese   Levofloxacin In D5w Hives   Other    Proanthocyanidin    Strawberry Extract Diarrhea    Seeds,nuts, lettuce, grapes   Wild Lettuce Extract (Lactuca Virosa)    Yeast-Related Products Hives    Mold on bread    PHYSICAL EXAM:  Performance status (ECOG): 1 - Symptomatic but completely ambulatory  There were no vitals filed for this visit.   Wt Readings from Last 3 Encounters:  09/09/22 159 lb 8 oz (72.3 kg)  08/31/22 168 lb 3.2 oz (76.3 kg)  08/18/22 170 lb (77.1 kg)   Physical Exam Vitals reviewed.  Constitutional:      Appearance: Normal appearance.  Cardiovascular:     Rate and Rhythm: Normal rate and regular rhythm.     Pulses: Normal pulses.     Heart sounds: Normal heart sounds.  Pulmonary:     Effort: Pulmonary effort is normal.     Breath sounds: Normal breath sounds.  Skin:    Comments: Skin peeling on L great toe  Neurological:     General: No focal deficit present.     Mental Status: She is alert and oriented to person, place, and time.  Psychiatric:        Mood and Affect: Mood normal.         Behavior: Behavior normal.     LABORATORY DATA:  I have reviewed the labs as listed.     Latest Ref Rng & Units 09/09/2022   10:49 AM 08/31/2022   10:22 AM 08/18/2022    8:20 AM  CBC  WBC 4.0 - 10.5 K/uL 3.6  2.8  3.9   Hemoglobin 12.0 - 15.0 g/dL 8.7  8.6  11.0   Hematocrit 36.0 - 46.0 % 27.1  25.5  33.6   Platelets 150 - 400 K/uL 322  82  162       Latest Ref Rng & Units 09/09/2022   10:49 AM 08/31/2022   10:22 AM 08/18/2022    8:20 AM  CMP  Glucose 70 - 99 mg/dL 100  106  97   BUN 8 - 23 mg/dL _0 Creatinine 0.44 - 1.00 mg/dL 0.69  0.55  0.72   Sodium 135 - 145 mmol/L 139  136  138   Potassium 3.5 - 5.1 mmol/L 3.4  3.1  3.6   Chloride 98 - 111 mmol/L 105  102  106   CO2 22 - 32 mmol/L _1 Calcium 8.9 - 10.3 mg/dL 8.5  8.0  8.7   Total Protein 6.5 - 8.1 g/dL 7.0  6.2  6.9   Total Bilirubin 0.3 - 1.2 mg/dL 0.6  0.4  0.4   Alkaline Phos 38 - 126 U/L 87  72  90   AST 15 - 41 U/L _2 ALT  0 - 44 U/L _0 DIAGNOSTIC IMAGING:  I have independently reviewed the scans and discussed with the patient. No results found.   ASSESSMENT:  1.  T3N3c (stage IIIc) triple negative invasive lobular carcinoma of the left breast: - She felt lump in her left breast for more than 6 months, but thought it was secondary to fibrocystic disease which she had all her life.  When she started having pain, she reached out to Dr. Sherrie Sport. - Ultrasound-guided left breast and left axillary lymph node biopsy on 01/15/2021 - Pathology consistent with invasive lobular carcinoma, E-cadherin negative.  ER/PR/HER2 negative.  HER2 2+ by IHC, negative by FISH.  Ki-67 is 5%.  Lymph node core biopsy was consistent with metastatic carcinoma.  Grade 2. - PET scan on 02/03/2021 showed involvement of left supraclavicular, subpectoral, axillary lymph nodes along with the breast mass.  10 mm left lung nodule which is hypometabolic.  Spinal cord lesion at T12 level with a strong  uptake. - MRI of the lumbar spine with and without contrast on 02/20/2021 showed no mass or abnormal enhancement within the canal at the T12 level to correspond to the site of PET scan positive.  No marrow replacing bone lesion. - 12 weeks of carboplatin and paclitaxel weekly and every 3 weeks pembrolizumab followed by 4 cycles of dose dense AC with pembrolizumab ([id number]) from 02/27/2021 through 10/01/2021 - PET scan on 10/16/2021: Reduction in metabolic activity in the size of the left axillary lymph node and no evidence of breast hypermetabolism on PET scan. - Left mastectomy and lymph node excision by Dr. Ninfa Linden on 11/20/2021 - Pathology: 6.2 cm invasive lobular carcinoma, grade 2, margins negative.  19/21 lymph nodes involved with macrometastasis.  ypT3, YPN3A - Adjuvant pembrolizumab started on 01/01/2022. -CREATE-X capecitabine 1500 mg twice daily 2 weeks on/1 week off started on 01/01/2022.  Last dose of pembrolizumab on 05/11/2022 - Germline mutation testing ([facility]) negative - [employer] (06/04/2022): T p53, K-ras V14 I, CDKN2A.  MSI-high not detected. - PET scan (05/28/2022): Multifocal bone lesions involving T11 vertebral body, right posterior sixth rib, left femoral neck, left scapular glenoid.  No other metastatic disease. - Sacituzumab cycle 1 on 08/18/2022.  She was hospitalized with neutropenic fever and severe diarrhea from 08/24/2022 through 08/27/2022.     2.  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  She is non-smoker. - Mother died of breast cancer.  Maternal grandmother died very young in her 22s, sister has fibrocystic disease.  Father died of lung cancer and was a smoker.   PLAN:  1.  Metastatic TNBC to the bones: - She has received cycle 1 of Sacituzumab on 08/18/2022. - Hospitalization from 10/25/2021 through 08/27/2022 with severe neutropenic fever and diarrhea. - CTAP (10/25/2021): Generalized mild to moderate wall  thickening throughout the nondistended large bowel with faint pericolonic fat haziness compatible with nonspecific infectious/inflammatory colitis.  No free air. - She had C. difficile tested negative x 2. - Her diarrhea symptoms have improved but not completely back to normal. - Reviewed labs today which showed a severely low magnesium and low potassium with normal creatinine.  White count is 3.6 with normal ANC. - She will receive a 1 L of fluids with electrolytes today.  I will hold her treatment today. - We have sent testing for UG T1 A1, which is pending.  RTC 2 weeks for follow-up.   2.  Brain  lesions (SRS to 3 brain lesions on 06/16/2022): - Brain MRI (07/15/2022): Stable to slight interval decrease in size of the intracranial metastasis.  No new lesions.  3.  Peripheral neuropathy: - Neuropathy in the toes is stable.  4.  Hypomagnesemia: - She is unable to take magnesium supplements due to diarrhea. - Magnesium is 1.5 today.  She will receive IV magnesium.    Orders placed this encounter:  Orders Placed This Encounter  Procedures   Cancer antigen 27.29   Cancer antigen 15-3       Derek Jack, MD [place] [phone]

## 2022-09-09 NOTE — Patient Instructions (Signed)
[facility]  Discharge Instructions: Thank you for choosing [facility] to provide your oncology and hematology care.  If you have a lab appointment with the Cancer Center, please come in thru the Main Entrance and check in at the main information desk.  Wear comfortable clothing and clothing appropriate for easy access to any Portacath or PICC line.   We strive to give you quality time with your provider. You may need to reschedule your appointment if you arrive late (15 or more minutes).  Arriving late affects you and other patients whose appointments are after yours.  Also, if you miss three or more appointments without notifying the office, you may be dismissed from the clinic at the provider's discretion.      For prescription refill requests, have your pharmacy contact our office and allow 72 hours for refills to be completed.    To help prevent nausea and vomiting after your treatment, we encourage you to take your nausea medication as directed.  BELOW ARE SYMPTOMS THAT SHOULD BE REPORTED IMMEDIATELY: *FEVER GREATER THAN 100.4 F (38 C) OR HIGHER *CHILLS OR SWEATING *NAUSEA AND VOMITING THAT IS NOT CONTROLLED WITH YOUR NAUSEA MEDICATION *UNUSUAL SHORTNESS OF BREATH *UNUSUAL BRUISING OR BLEEDING *URINARY PROBLEMS (pain or burning when urinating, or frequent urination) *BOWEL PROBLEMS (unusual diarrhea, constipation, pain near the anus) TENDERNESS IN MOUTH AND THROAT WITH OR WITHOUT PRESENCE OF ULCERS (sore throat, sores in mouth, or a toothache) UNUSUAL RASH, SWELLING OR PAIN  UNUSUAL VAGINAL DISCHARGE OR ITCHING   Items with * indicate a potential emergency and should be followed up as soon as possible or go to the Emergency Department if any problems should occur.  Please show the CHEMOTHERAPY ALERT CARD or IMMUNOTHERAPY ALERT CARD at check-in to the Emergency Department and triage nurse.  Should you have questions after your visit or need to  cancel or reschedule your appointment, please contact [facility] [phone]  and follow the prompts.  Office hours are 8:00 a.m. to 4:30 p.m. Monday - Friday. Please note that voicemails left after 4:00 p.m. may not be returned until the following business day.  We are closed weekends and major holidays. You have access to a nurse at all times for urgent questions. Please call the main number to the clinic [phone] and follow the prompts.  For any non-urgent questions, you may also contact your provider using MyChart. We now offer e-Visits for anyone 18 and older to request care online for non-urgent symptoms. For details visit [email or web address].   Also download the MyChart app! Go to the app store, search "MyChart", open the app, select [facility], and log in with your MyChart username and password.   

## 2022-09-09 NOTE — Progress Notes (Signed)
Patient tolerated hydration with no complaints voiced.  Port site clean and dry with good blood return noted before and after hydration.  No bruising or swelling noted with port.  Band aid applied.  VSS with discharge and left ambulatory with no s/s of distress noted.   

## 2022-09-09 NOTE — Progress Notes (Signed)
Patients port flushed without difficulty.  Good blood return noted with no bruising or swelling noted at site.  Stable during access and blood draw.  Patient to remain accessed for treatment. 

## 2022-09-10 ENCOUNTER — Other Ambulatory Visit: Payer: Self-pay

## 2022-09-16 ENCOUNTER — Other Ambulatory Visit: Payer: Medicare Other

## 2022-09-16 ENCOUNTER — Ambulatory Visit: Payer: Medicare Other

## 2022-09-16 LAB — MISC LABCORP TEST (SEND OUT): Labcorp test code: 511200

## 2022-09-28 ENCOUNTER — Inpatient Hospital Stay (HOSPITAL_BASED_OUTPATIENT_CLINIC_OR_DEPARTMENT_OTHER): Payer: Medicare Other | Admitting: Hematology

## 2022-09-28 ENCOUNTER — Inpatient Hospital Stay: Payer: Medicare Other | Attending: Hematology

## 2022-09-28 ENCOUNTER — Inpatient Hospital Stay: Payer: Medicare Other

## 2022-09-28 DIAGNOSIS — K521 Toxic gastroenteritis and colitis: Secondary | ICD-10-CM | POA: Diagnosis not present

## 2022-09-28 DIAGNOSIS — Z95828 Presence of other vascular implants and grafts: Secondary | ICD-10-CM

## 2022-09-28 DIAGNOSIS — Z5111 Encounter for antineoplastic chemotherapy: Secondary | ICD-10-CM | POA: Diagnosis not present

## 2022-09-28 DIAGNOSIS — D701 Agranulocytosis secondary to cancer chemotherapy: Secondary | ICD-10-CM | POA: Diagnosis not present

## 2022-09-28 DIAGNOSIS — Z171 Estrogen receptor negative status [ER-]: Secondary | ICD-10-CM | POA: Diagnosis not present

## 2022-09-28 DIAGNOSIS — C7951 Secondary malignant neoplasm of bone: Secondary | ICD-10-CM | POA: Insufficient documentation

## 2022-09-28 DIAGNOSIS — C7931 Secondary malignant neoplasm of brain: Secondary | ICD-10-CM | POA: Diagnosis not present

## 2022-09-28 DIAGNOSIS — G629 Polyneuropathy, unspecified: Secondary | ICD-10-CM | POA: Diagnosis not present

## 2022-09-28 DIAGNOSIS — C50912 Malignant neoplasm of unspecified site of left female breast: Secondary | ICD-10-CM

## 2022-09-28 DIAGNOSIS — Z79899 Other long term (current) drug therapy: Secondary | ICD-10-CM | POA: Diagnosis not present

## 2022-09-28 DIAGNOSIS — T451X5A Adverse effect of antineoplastic and immunosuppressive drugs, initial encounter: Secondary | ICD-10-CM | POA: Diagnosis not present

## 2022-09-28 LAB — CBC WITH DIFFERENTIAL/PLATELET
Abs Immature Granulocytes: 0.02 10*3/uL (ref 0.00–0.07)
Basophils Absolute: 0 10*3/uL (ref 0.0–0.1)
Basophils Relative: 0 %
Eosinophils Absolute: 0.1 10*3/uL (ref 0.0–0.5)
Eosinophils Relative: 3 %
HCT: 31 % — ABNORMAL LOW (ref 36.0–46.0)
Hemoglobin: 9.8 g/dL — ABNORMAL LOW (ref 12.0–15.0)
Immature Granulocytes: 0 %
Lymphocytes Relative: 27 %
Lymphs Abs: 1.4 10*3/uL (ref 0.7–4.0)
MCH: 32.9 pg (ref 26.0–34.0)
MCHC: 31.6 g/dL (ref 30.0–36.0)
MCV: 104 fL — ABNORMAL HIGH (ref 80.0–100.0)
Monocytes Absolute: 0.6 10*3/uL (ref 0.1–1.0)
Monocytes Relative: 11 %
Neutro Abs: 3.1 10*3/uL (ref 1.7–7.7)
Neutrophils Relative %: 59 %
Platelets: 259 10*3/uL (ref 150–400)
RBC: 2.98 MIL/uL — ABNORMAL LOW (ref 3.87–5.11)
RDW: 14.3 % (ref 11.5–15.5)
WBC: 5.3 10*3/uL (ref 4.0–10.5)
nRBC: 0 % (ref 0.0–0.2)

## 2022-09-28 LAB — COMPREHENSIVE METABOLIC PANEL
ALT: 14 U/L (ref 0–44)
AST: 23 U/L (ref 15–41)
Albumin: 3.2 g/dL — ABNORMAL LOW (ref 3.5–5.0)
Alkaline Phosphatase: 98 U/L (ref 38–126)
Anion gap: 9 (ref 5–15)
BUN: 19 mg/dL (ref 8–23)
CO2: 23 mmol/L (ref 22–32)
Calcium: 8.6 mg/dL — ABNORMAL LOW (ref 8.9–10.3)
Chloride: 103 mmol/L (ref 98–111)
Creatinine, Ser: 0.73 mg/dL (ref 0.44–1.00)
GFR, Estimated: >60 mL/min (ref >60)
Glucose, Bld: 95 mg/dL (ref 70–99)
Potassium: 3.8 mmol/L (ref 3.5–5.1)
Sodium: 135 mmol/L (ref 135–145)
Total Bilirubin: 0.2 mg/dL — ABNORMAL LOW (ref 0.3–1.2)
Total Protein: 7.2 g/dL (ref 6.5–8.1)

## 2022-09-28 LAB — MAGNESIUM: Magnesium: 1.7 mg/dL (ref 1.7–2.4)

## 2022-09-28 LAB — TSH: TSH: 1.988 u[IU]/mL (ref 0.350–4.500)

## 2022-09-28 MED ORDER — SODIUM CHLORIDE 0.9% FLUSH
10.0000 mL | Freq: Once | INTRAVENOUS | Status: AC
  Administered 2022-09-28: 10 mL via INTRAVENOUS

## 2022-09-28 MED ORDER — HEPARIN SOD (PORK) LOCK FLUSH 100 UNIT/ML IV SOLN
500.0000 [IU] | Freq: Once | INTRAVENOUS | Status: AC
  Administered 2022-09-28: 500 [IU] via INTRAVENOUS

## 2022-09-28 NOTE — Progress Notes (Signed)
No treatment today per Delton Coombes, will restart treatment in one week.

## 2022-09-28 NOTE — Patient Instructions (Addendum)
[place] at [facility] Discharge Instructions   You were seen and examined today by Dr. Delton Coombes.  He reviewed the results of your lab work which are normal/stable.  He discussed with you that the special lab test we did that shows you are sensitive to the irinotecan.   We will delay your treatment until you get your constipation under control.   We will see you back in one week. We will consider restarting treatment at that time.     Thank you for choosing [place] at [facility] to provide your oncology and hematology care.  To afford each patient quality time with our provider, please arrive at least 15 minutes before your scheduled appointment time.   If you have a lab appointment with the [place] please come in thru the Main Entrance and check in at the main information desk.  You need to re-schedule your appointment should you arrive 10 or more minutes late.  We strive to give you quality time with our providers, and arriving late affects you and other patients whose appointments are after yours.  Also, if you no show three or more times for appointments you may be dismissed from the clinic at the providers discretion.     Again, thank you for choosing [facility].  Our hope is that these requests will decrease the amount of time that you wait before being seen by our physicians.       _____________________________________________________________  Should you have questions after your visit to [facility], please contact our office at [phone] and follow the prompts.  Our office hours are 8:00 a.m. and 4:30 p.m. Monday - Friday.  Please note that voicemails left after 4:00 p.m. may not be returned until the following business day.  We are closed weekends and major holidays.  You do have access to a nurse 24-7, just call the main number to the clinic [phone] and do not press any options, hold  on the line and a nurse will answer the phone.    For prescription refill requests, have your pharmacy contact our office and allow 72 hours.    Due to Covid, you will need to wear a mask upon entering the hospital. If you do not have a mask, a mask will be given to you at the Main Entrance upon arrival. For doctor visits, patients may have 1 support person age 93 or older with them. For treatment visits, patients can not have anyone with them due to social distancing guidelines and our immunocompromised population.

## 2022-09-28 NOTE — Progress Notes (Signed)
Denise [name], [place] [id number]   CLINIC:  Medical Oncology/Hematology  PCP:  Neale Burly, MD [place] / [place] [phone]   REASON FOR VISIT:  Metastatic triple negative breast cancer to the bones.  PRIOR THERAPY: 1.  Neoadjuvant chemotherapy with weekly carboplatin and paclitaxel and dose dense AC with pembrolizumab  2. Left mastectomy and lymph node excision by Dr. Ninfa Linden on 11/20/2021  NGS Results: Requested and pending  CURRENT THERAPY: Sacituzumab govitecan  BRIEF ONCOLOGIC HISTORY:  Oncology History  Invasive lobular carcinoma of left breast in female [facility])  01/23/2021 Initial Diagnosis   Invasive lobular carcinoma of left breast in female [facility])   02/19/2021 Genetic Testing   Negative genetic testing on the CancerNext-Expanded+RNAinsight panel.  SMARCB1 VUS identified.  The CancerNext-Expanded gene panel offered by [facility] and includes sequencing and rearrangement analysis for the following 77 genes: AIP, ALK, APC*, ATM*, AXIN2, BAP1, BARD1, BLM, BMPR1A, BRCA1*, BRCA2*, BRIP1*, CDC73, CDH1*, CDK4, CDKN1B, CDKN2A, CHEK2*, CTNNA1, DICER1, FANCC, FH, FLCN, GALNT12, KIF1B, LZTR1, MAX, MEN1, MET, MLH1*, MSH2*, MSH3, MSH6*, MUTYH*, NBN, NF1*, NF2, NTHL1, PALB2*, PHOX2B, PMS2*, POT1, PRKAR1A, PTCH1, PTEN*, RAD51C*, RAD51D*, RB1, RECQL, RET, SDHA, SDHAF2, SDHB, SDHC, SDHD, SMAD4, SMARCA4, SMARCB1, SMARCE1, STK11, SUFU, TMEM127, TP53*, TSC1, TSC2, VHL and XRCC2 (sequencing and deletion/duplication); EGFR, EGLN1, HOXB13, KIT, MITF, PDGFRA, POLD1, and POLE (sequencing only); EPCAM and GREM1 (deletion/duplication only). DNA and RNA analyses performed for * genes. The report date is February 19, 2021.   02/27/2021 - 05/11/2022 Chemotherapy   Patient is on Treatment Plan : BREAST Pembrolizumab + Carboplatin D1,8,15+ Paclitaxel D1,8,15 q21d X 4 cycles / Pembrolizumab + AC q21d x 4 cycles     12/22/2021 Cancer Staging   Staging  form: Breast, AJCC 8th Edition - Pathologic stage from 12/22/2021: No Stage Recommended (ypT3, pN3a, cM0, G2, ER-, PR-, HER2-) - Signed by Derek Jack, MD on 12/22/2021 Histopathologic type: Lobular carcinoma, NOS Stage prefix: Post-therapy Method of lymph node assessment: Axillary lymph node dissection Multigene prognostic tests performed: None Histologic grading system: 3 grade system   08/18/2022 -  Chemotherapy   Patient is on Treatment Plan : BREAST METASTATIC Sacituzumab govitecan-hziy Ivette Loyal) D1,8 q21d       CANCER STAGING:  Cancer Staging  Invasive lobular carcinoma of left breast in female [facility]) Staging form: Breast, AJCC 8th Edition - Clinical stage from 01/23/2021: cT3, cN3c, G2, ER-, PR-, HER2- - Unsigned - Pathologic stage from 12/22/2021: No Stage Recommended (ypT3, pN3a, cM0, G2, ER-, PR-, HER2-) - Signed by Derek Jack, MD on 12/22/2021   INTERVAL HISTORY:  Denise [name], a 72 y.o. female, seen for follow-up of metastatic triple negative breast cancer to the bones.  She is feeling much better compared to last visit with increase in energy levels to 60 to 70%.  She is eating better and has gained weight.  She is having some constipation.  Also has dizziness and headaches and having difficulty controlling blood pressure.  REVIEW OF SYSTEMS:  Review of Systems  Constitutional:  Negative for appetite change.  Respiratory:  Negative for shortness of breath.   Cardiovascular:  Positive for chest pain (Heartburn).  Gastrointestinal:  Positive for constipation and nausea. Negative for vomiting.  Skin:  Negative for rash.  Neurological:  Positive for dizziness and headaches.  All other systems reviewed and are negative.   PAST MEDICAL/SURGICAL HISTORY:  Past Medical History:  Diagnosis Date   Asthma  as child   Cancer ([place]) 12/2020   left breast IMC   Complication of anesthesia    pateitn states,' I coded when I had my D&Cmany years ago.    Dyspnea    Family history of breast cancer    Hypertension    Hypothyroidism    PONV (postoperative nausea and vomiting)    Port-A-Cath in place 02/26/2021   Past Surgical History:  Procedure Laterality Date   ABDOMINAL HYSTERECTOMY     BIOPSY  01/17/2018   Procedure: BIOPSY;  Surgeon: Rogene Houston, MD;  [facility];  Service: Endoscopy;;  duodenum,gastric   CHOLECYSTECTOMY     COLONOSCOPY WITH PROPOFOL N/A 01/17/2018   Procedure: COLONOSCOPY WITH PROPOFOL;  Surgeon: Rogene Houston, MD;  [facility];  Service: Endoscopy;  Laterality: N/A;  7:30   DILATION AND CURETTAGE OF UTERUS     ESOPHAGOGASTRODUODENOSCOPY (EGD) WITH PROPOFOL N/A 01/17/2018   Procedure: ESOPHAGOGASTRODUODENOSCOPY (EGD) WITH PROPOFOL;  Surgeon: Rogene Houston, MD;  [facility];  Service: Endoscopy;  Laterality: N/A;   POLYPECTOMY  01/17/2018   Procedure: POLYPECTOMY;  Surgeon: Rogene Houston, MD;  [facility];  Service: Endoscopy;;  transverse colon, cecal   PORTACATH PLACEMENT Right 02/17/2021   Procedure: INSERTION PORT-A-CATH;  Surgeon: Coralie Keens, MD;  Location: [place];  Service: General;  Laterality: Right;   RADIOACTIVE SEED GUIDED AXILLARY SENTINEL LYMPH NODE Left 11/20/2021   Procedure: RADIOACTIVE SEED GUIDED LEFT AXILLARY SENTINEL LYMPH NODE DISSECTION;  Surgeon: Coralie Keens, MD;  Location: [place];  Service: General;  Laterality: Left;   TOTAL MASTECTOMY Left 11/20/2021   Procedure: LEFT TOTAL MASTECTOMY;  Surgeon: Coralie Keens, MD;  Location: [place];  Service: General;  Laterality: Left;    SOCIAL HISTORY:  Social History   Socioeconomic History   Marital status: Widowed    Spouse name: Not on file   Number of children: 3   Years of education: Not on file   Highest education level: Not on file  Occupational History   Occupation: [employer]   Occupation: retired   Tobacco Use   Smoking status: Never   Smokeless tobacco: Never  Vaping Use   Vaping Use: Never used  Substance and Sexual Activity   Alcohol use: Never   Drug use: Never   Sexual activity: Not Currently    Birth control/protection: Surgical  Other Topics Concern   Not on file  Social History Narrative   Not on file   Social Determinants of Health   Financial Resource Strain: [place]  (07/10/2022)   Overall Financial Resource Strain (CARDIA)    Difficulty of Paying Living Expenses: Not hard at all  Food Insecurity: No Food Insecurity (06/30/2022)   Hunger Vital Sign    Worried About Running Out of Food in the Last Year: Never true    [place] in the Last Year: Never true  Transportation Needs: No Transportation Needs (07/10/2022)   PRAPARE - Hydrologist (Medical): No    Lack of Transportation (Non-Medical): No  Physical Activity: Insufficiently Active (01/22/2021)   Exercise Vital Sign    Days of Exercise per Week: 3 days    Minutes of Exercise per Session: 30 min  Stress: No Stress Concern Present (01/22/2021)   [place]    Feeling of Stress : Not at all  Social  Connections: Moderately Integrated (01/22/2021)   Social Connection and Isolation Panel [NHANES]    Frequency of Communication with Friends and Family: More than three times a week    Frequency of Social Gatherings with Friends and Family: More than three times a week    Attends Religious Services: More than 4 times per year    Active Member of [employer] or Organizations: No    Attends Music therapist: More than 4 times per year    Marital Status: Widowed  Intimate Partner Violence: Not At Risk (05/28/2022)   Humiliation, Afraid, Rape, and Kick questionnaire    Fear of Current or Ex-Partner: No    Emotionally Abused: No    Physically Abused: No    Sexually Abused: No    FAMILY HISTORY:  Family  History  Problem Relation Age of Onset   Breast cancer Mother        dx in her 23s   Heart disease Mother    Thyroid disease Mother    Lung cancer Father        dx in his 81s   Thyroid disease Maternal Aunt    Thyroid nodules Maternal Grandmother 35       goiter   Thyroid disease Maternal Grandmother    Heart disease Maternal Grandfather    Heart disease Paternal Grandmother    Heart disease Paternal Grandfather    Thyroid disease Daughter    Thyroid disease Daughter    Cancer Maternal Uncle        NOS   Cancer Paternal Uncle        NOS   Breast cancer Cousin        pat first cousin died in her 41s;     CURRENT MEDICATIONS:  Current Outpatient Medications  Medication Sig Dispense Refill   Acetaminophen (TYLENOL PO) Take 1 tablet by mouth as needed.     gabapentin (NEURONTIN) 300 MG capsule Take 1 capsule by mouth at bedtime.     hydrALAZINE (APRESOLINE) 25 MG tablet Take 25 mg by mouth 3 (three) times daily.     levothyroxine (SYNTHROID) 75 MCG tablet Take 100 mcg by mouth daily before breakfast.     Melatonin 5 MG TABS Take 10 mg by mouth. As needed for sleep     metoCLOPramide (REGLAN) 10 MG/10ML SOLN Take by mouth as needed for nausea.     olmesartan-hydrochlorothiazide (BENICAR HCT) 40-25 MG tablet Take 1 tablet by mouth daily.     pantoprazole (PROTONIX) 40 MG tablet Take 40 mg by mouth daily.     sucralfate (CARAFATE) 1 GM/10ML suspension Take by mouth.     promethazine (PHENERGAN) 25 MG tablet Take by mouth. (Patient not taking: Reported on 09/28/2022)     No current facility-administered medications for this visit.    ALLERGIES:  Allergies  Allergen Reactions   Exforge [Amlodipine Besylate-Valsartan] Nausea And Vomiting   Gadolinium Derivatives Hives, Itching and Anaphylaxis    13hr prep prior to contrast admin  MRI iv contrast   Gadopiclenol Hives and Itching    13 hr prep prior to contrast admin    Tape Other (See Comments)    Redness and Irriatation    Cheese     Hard cheese   Levofloxacin In D5w Hives   Other    Proanthocyanidin    Strawberry Extract Diarrhea    Seeds,nuts, lettuce, grapes   Wild Lettuce Extract (Lactuca Virosa)    Yeast-Related Products Hives    Mold on bread  PHYSICAL EXAM:  Performance status (ECOG): 1 - Symptomatic but completely ambulatory  There were no vitals filed for this visit.   Wt Readings from Last 3 Encounters:  09/28/22 162 lb 3.2 oz (73.6 kg)  09/09/22 159 lb 8 oz (72.3 kg)  08/31/22 168 lb 3.2 oz (76.3 kg)   Physical Exam Vitals reviewed.  Constitutional:      Appearance: Normal appearance.  Cardiovascular:     Rate and Rhythm: Normal rate and regular rhythm.     Pulses: Normal pulses.     Heart sounds: Normal heart sounds.  Pulmonary:     Effort: Pulmonary effort is normal.     Breath sounds: Normal breath sounds.  Skin:    Comments: Skin peeling on L great toe  Neurological:     General: No focal deficit present.     Mental Status: She is alert and oriented to person, place, and time.  Psychiatric:        Mood and Affect: Mood normal.        Behavior: Behavior normal.    LABORATORY DATA:  I have reviewed the labs as listed.     Latest Ref Rng & Units 09/28/2022   11:50 AM 09/09/2022   10:49 AM 08/31/2022   10:22 AM  CBC  WBC 4.0 - 10.5 K/uL 5.3  3.6  2.8   Hemoglobin 12.0 - 15.0 g/dL 9.8  8.7  8.6   Hematocrit 36.0 - 46.0 % 31.0  27.1  25.5   Platelets 150 - 400 K/uL 259  322  82       Latest Ref Rng & Units 09/28/2022   11:50 AM 09/09/2022   10:49 AM 08/31/2022   10:22 AM  CMP  Glucose 70 - 99 mg/dL 95  100  106   BUN 8 - 23 mg/dL '19  11  6   '$ Creatinine 0.44 - 1.00 mg/dL 0.73  0.69  0.55   Sodium 135 - 145 mmol/L 135  139  136   Potassium 3.5 - 5.1 mmol/L 3.8  3.4  3.1   Chloride 98 - 111 mmol/L 103  105  102   CO2 22 - 32 mmol/L '23  25  26   '$ Calcium 8.9 - 10.3 mg/dL 8.6  8.5  8.0   Total Protein 6.5 - 8.1 g/dL 7.2  7.0  6.2   Total Bilirubin 0.3 - 1.2  mg/dL 0.2  0.6  0.4   Alkaline Phos 38 - 126 U/L 98  87  72   AST 15 - 41 U/L '23  14  22   '$ ALT 0 - 44 U/L '14  9  16     '$ DIAGNOSTIC IMAGING:  I have independently reviewed the scans and discussed with the patient. No results found.   ASSESSMENT:  1.  T3N3c (stage IIIc) triple negative invasive lobular carcinoma of the left breast: - She felt lump in her left breast for more than 6 months, but thought it was secondary to fibrocystic disease which she had all her life.  When she started having pain, she reached out to Dr. Sherrie Sport. - Ultrasound-guided left breast and left axillary lymph node biopsy on 01/15/2021 - Pathology consistent with invasive lobular carcinoma, E-cadherin negative.  ER/PR/HER2 negative.  HER2 2+ by IHC, negative by FISH.  Ki-67 is 5%.  Lymph node core biopsy was consistent with metastatic carcinoma.  Grade 2. - PET scan on 02/03/2021 showed involvement of left supraclavicular, subpectoral, axillary lymph nodes along with the breast  mass.  10 mm left lung nodule which is hypometabolic.  Spinal cord lesion at T12 level with a strong uptake. - MRI of the lumbar spine with and without contrast on 02/20/2021 showed no mass or abnormal enhancement within the canal at the T12 level to correspond to the site of PET scan positive.  No marrow replacing bone lesion. - 12 weeks of carboplatin and paclitaxel weekly and every 3 weeks pembrolizumab followed by 4 cycles of dose dense AC with pembrolizumab ([id number]) from 02/27/2021 through 10/01/2021 - PET scan on 10/16/2021: Reduction in metabolic activity in the size of the left axillary lymph node and no evidence of breast hypermetabolism on PET scan. - Left mastectomy and lymph node excision by Dr. Ninfa Linden on 11/20/2021 - Pathology: 6.2 cm invasive lobular carcinoma, grade 2, margins negative.  19/21 lymph nodes involved with macrometastasis.  ypT3, YPN3A - Adjuvant pembrolizumab started on 01/01/2022. -CREATE-X capecitabine 1500 mg twice daily  2 weeks on/1 week off started on 01/01/2022.  Last dose of pembrolizumab on 05/11/2022 - Germline mutation testing ([facility]) negative - [employer] (06/04/2022): T p53, K-ras V14 I, CDKN2A.  MSI-high not detected. - PET scan (05/28/2022): Multifocal bone lesions involving T11 vertebral body, right posterior sixth rib, left femoral neck, left scapular glenoid.  No other metastatic disease. - Sacituzumab cycle 1 on 08/18/2022.  She was hospitalized with neutropenic fever and severe diarrhea from 08/24/2022 through 08/27/2022. - CTAP (10/25/2021): Generalized mild to moderate wall thickening throughout the nondistended large bowel with faint pericolonic fat haziness compatible with nonspecific infectious/inflammatory colitis.  No free air.  C. difficile x 2 was negative.     2.  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  She is non-smoker. - Mother died of breast cancer.  Maternal grandmother died very young in her 79s, sister has fibrocystic disease.  Father died of lung cancer and was a smoker.   PLAN:  1.  Metastatic TNBC to the bones: - We reviewed results of UG T1 A1*28 heterozygosity. - Reviewed labs today with normal LFTs.  CBC was grossly normal with anemia improving to 9.8. - As she had severe neutropenia and diarrhea, recommend decreasing Sacituzumab dose to 5 mg/kg.  She would like to wait 1 more week before restarting cycle 2.  She will start cycle 2 next week.  I plan to see her back on day 8 of cycle 2.   2.  Brain lesions (SRS to 3 brain lesions on 06/16/2022): - Last brain MRI on 07/15/2022 showed stable to slight interval decrease in size of intracranial metastasis with no new lesions. - She reportedly had reaction/hypersensitivity to vueway  contrast.  She will need another MRI of the brain.  3.  Peripheral neuropathy: - Neuropathy in the toes is stable.  4.  Hypomagnesemia: - She is unable to take magnesium due to  diarrhea. - Magnesium today improved to 1.7.    Orders placed this encounter:  No orders of the defined types were placed in this encounter.      Derek Jack, MD [place] [phone]

## 2022-09-29 ENCOUNTER — Other Ambulatory Visit: Payer: Self-pay

## 2022-09-29 LAB — CANCER ANTIGEN 27.29: CA 27.29: 23 U/mL (ref 0.0–38.6)

## 2022-09-30 ENCOUNTER — Other Ambulatory Visit: Payer: Self-pay

## 2022-09-30 ENCOUNTER — Other Ambulatory Visit: Payer: Self-pay | Admitting: Radiation Therapy

## 2022-09-30 ENCOUNTER — Other Ambulatory Visit: Payer: Self-pay | Admitting: Radiation Oncology

## 2022-09-30 DIAGNOSIS — C7931 Secondary malignant neoplasm of brain: Secondary | ICD-10-CM

## 2022-09-30 LAB — CANCER ANTIGEN 15-3: CA 15-3: 27.1 U/mL — ABNORMAL HIGH (ref 0.0–25.0)

## 2022-09-30 MED ORDER — PREDNISONE 50 MG PO TABS: ORAL_TABLET | ORAL | 0 refills | Status: DC

## 2022-10-02 ENCOUNTER — Other Ambulatory Visit: Payer: Self-pay

## 2022-10-04 ENCOUNTER — Other Ambulatory Visit: Payer: Self-pay

## 2022-10-08 ENCOUNTER — Inpatient Hospital Stay: Payer: Medicare Other

## 2022-10-08 ENCOUNTER — Encounter: Payer: Self-pay | Admitting: Hematology

## 2022-10-08 VITALS — BP 141/74 | HR 66 | Temp 99.1°F | Resp 18

## 2022-10-08 DIAGNOSIS — C7951 Secondary malignant neoplasm of bone: Secondary | ICD-10-CM | POA: Diagnosis not present

## 2022-10-08 DIAGNOSIS — C50912 Malignant neoplasm of unspecified site of left female breast: Secondary | ICD-10-CM

## 2022-10-08 DIAGNOSIS — D701 Agranulocytosis secondary to cancer chemotherapy: Secondary | ICD-10-CM | POA: Diagnosis not present

## 2022-10-08 DIAGNOSIS — Z95828 Presence of other vascular implants and grafts: Secondary | ICD-10-CM

## 2022-10-08 DIAGNOSIS — Z5111 Encounter for antineoplastic chemotherapy: Secondary | ICD-10-CM | POA: Diagnosis not present

## 2022-10-08 DIAGNOSIS — C7931 Secondary malignant neoplasm of brain: Secondary | ICD-10-CM | POA: Diagnosis not present

## 2022-10-08 DIAGNOSIS — Z171 Estrogen receptor negative status [ER-]: Secondary | ICD-10-CM | POA: Diagnosis not present

## 2022-10-08 LAB — PHOSPHORUS: Phosphorus: 4 mg/dL (ref 2.5–4.6)

## 2022-10-08 LAB — COMPREHENSIVE METABOLIC PANEL
ALT: 16 U/L (ref 0–44)
AST: 26 U/L (ref 15–41)
Albumin: 3.7 g/dL (ref 3.5–5.0)
Alkaline Phosphatase: 101 U/L (ref 38–126)
Anion gap: 9 (ref 5–15)
BUN: 25 mg/dL — ABNORMAL HIGH (ref 8–23)
CO2: 23 mmol/L (ref 22–32)
Calcium: 9 mg/dL (ref 8.9–10.3)
Chloride: 100 mmol/L (ref 98–111)
Creatinine, Ser: 0.85 mg/dL (ref 0.44–1.00)
GFR, Estimated: >60 mL/min (ref >60)
Glucose, Bld: 128 mg/dL — ABNORMAL HIGH (ref 70–99)
Potassium: 3.6 mmol/L (ref 3.5–5.1)
Sodium: 132 mmol/L — ABNORMAL LOW (ref 135–145)
Total Bilirubin: 0.5 mg/dL (ref 0.3–1.2)
Total Protein: 7.8 g/dL (ref 6.5–8.1)

## 2022-10-08 LAB — CBC WITH DIFFERENTIAL/PLATELET
Abs Immature Granulocytes: 0.01 10*3/uL (ref 0.00–0.07)
Basophils Absolute: 0 10*3/uL (ref 0.0–0.1)
Basophils Relative: 0 %
Eosinophils Absolute: 0.1 10*3/uL (ref 0.0–0.5)
Eosinophils Relative: 3 %
HCT: 33.2 % — ABNORMAL LOW (ref 36.0–46.0)
Hemoglobin: 10.8 g/dL — ABNORMAL LOW (ref 12.0–15.0)
Immature Granulocytes: 0 %
Lymphocytes Relative: 35 %
Lymphs Abs: 1.6 10*3/uL (ref 0.7–4.0)
MCH: 33.8 pg (ref 26.0–34.0)
MCHC: 32.5 g/dL (ref 30.0–36.0)
MCV: 103.8 fL — ABNORMAL HIGH (ref 80.0–100.0)
Monocytes Absolute: 0.5 10*3/uL (ref 0.1–1.0)
Monocytes Relative: 10 %
Neutro Abs: 2.4 10*3/uL (ref 1.7–7.7)
Neutrophils Relative %: 52 %
Platelets: 237 10*3/uL (ref 150–400)
RBC: 3.2 MIL/uL — ABNORMAL LOW (ref 3.87–5.11)
RDW: 13.8 % (ref 11.5–15.5)
WBC: 4.6 10*3/uL (ref 4.0–10.5)
nRBC: 0 % (ref 0.0–0.2)

## 2022-10-08 LAB — MAGNESIUM: Magnesium: 1.8 mg/dL (ref 1.7–2.4)

## 2022-10-08 MED ORDER — DIPHENHYDRAMINE HCL 50 MG/ML IJ SOLN: 50.0000 mg | Freq: Once | INTRAMUSCULAR | Status: DC

## 2022-10-08 MED ORDER — ATROPINE SULFATE 1 MG/ML IV SOLN
0.5000 mg | Freq: Once | INTRAVENOUS | Status: AC | PRN
  Administered 2022-10-08: 0.5 mg via INTRAVENOUS
  Filled 2022-10-08: qty 1

## 2022-10-08 MED ORDER — ACETAMINOPHEN 325 MG PO TABS
650.0000 mg | ORAL_TABLET | Freq: Once | ORAL | Status: AC
  Administered 2022-10-08: 650 mg via ORAL
  Filled 2022-10-08: qty 2

## 2022-10-08 MED ORDER — SODIUM CHLORIDE 0.9 % IV SOLN
360.0000 mg | Freq: Once | INTRAVENOUS | Status: AC
  Administered 2022-10-08: 360 mg via INTRAVENOUS
  Filled 2022-10-08: qty 36

## 2022-10-08 MED ORDER — SODIUM CHLORIDE 0.9 % IV SOLN
10.0000 mg | Freq: Once | INTRAVENOUS | Status: AC
  Administered 2022-10-08: 10 mg via INTRAVENOUS
  Filled 2022-10-08: qty 10

## 2022-10-08 MED ORDER — HEPARIN SOD (PORK) LOCK FLUSH 100 UNIT/ML IV SOLN
500.0000 [IU] | Freq: Once | INTRAVENOUS | Status: AC | PRN
  Administered 2022-10-08: 500 [IU]

## 2022-10-08 MED ORDER — CETIRIZINE HCL 10 MG/ML IV SOLN
10.0000 mg | Freq: Once | INTRAVENOUS | Status: AC
  Administered 2022-10-08: 10 mg via INTRAVENOUS
  Filled 2022-10-08: qty 1

## 2022-10-08 MED ORDER — FAMOTIDINE IN NACL 20-0.9 MG/50ML-% IV SOLN
20.0000 mg | Freq: Once | INTRAVENOUS | Status: AC
  Administered 2022-10-08: 20 mg via INTRAVENOUS
  Filled 2022-10-08: qty 50

## 2022-10-08 MED ORDER — SODIUM CHLORIDE 0.9 % IV SOLN: Freq: Once | INTRAVENOUS | Status: AC

## 2022-10-08 MED ORDER — SODIUM CHLORIDE 0.9% FLUSH
10.0000 mL | Freq: Once | INTRAVENOUS | Status: AC
  Administered 2022-10-08: 10 mL via INTRAVENOUS

## 2022-10-08 MED ORDER — SODIUM CHLORIDE 0.9 % IV SOLN
150.0000 mg | Freq: Once | INTRAVENOUS | Status: AC
  Administered 2022-10-08: 150 mg via INTRAVENOUS
  Filled 2022-10-08: qty 150

## 2022-10-08 MED ORDER — SODIUM CHLORIDE 0.9% FLUSH
10.0000 mL | INTRAVENOUS | Status: DC | PRN
  Administered 2022-10-08: 10 mL

## 2022-10-08 MED ORDER — PALONOSETRON HCL INJECTION 0.25 MG/5ML
0.2500 mg | Freq: Once | INTRAVENOUS | Status: AC
  Administered 2022-10-08: 0.25 mg via INTRAVENOUS
  Filled 2022-10-08: qty 5

## 2022-10-08 NOTE — Progress Notes (Signed)
Discontinue diphenhydramine from oncology treatment plan --> Add Quzyttir (cetirizine) 10 mg IVPush x 1 as premedication for oncology treatment plan.  Patient with >10% weight loss will adjust dose of Trodelvy with today's weight of 72.8 kg   Trodelvy 5 mg/kg = 360 mg rounded for vial size.  T.O. Dr Rhys Martini, PharmD

## 2022-10-08 NOTE — Progress Notes (Signed)
Patient reports some swelling and clear drainage from area on her left breast mastectomy site.  Dr. Delton Coombes at bedside and wants patient to see surgeon.  She will call them and let them know that Dr. Delton Coombes has placed a picture in her chart as well.  Dr. Delton Coombes is okay with her proceeding with treatment today as planned once her labs are back.  He has dose reduced to 50%.  Will await labs at this time.

## 2022-10-08 NOTE — Progress Notes (Signed)
Patient presents today for Trodelvy infusion per providers order.  Vital signs and labs within parameters for treatment.  Patient has no new complaints at this time.  Treatment given today per MD orders.  Stable during infusion without adverse affects.  Vital signs stable.  No complaints at this time.  Discharge from clinic ambulatory in stable condition.  Alert and oriented X 3.  Follow up with [facility] as scheduled.

## 2022-10-08 NOTE — Patient Instructions (Signed)
[facility]  Discharge Instructions: Thank you for choosing [facility] to provide your oncology and hematology care.  If you have a lab appointment with the Cancer Center, please come in thru the Main Entrance and check in at the main information desk.  Wear comfortable clothing and clothing appropriate for easy access to any Portacath or PICC line.   We strive to give you quality time with your provider. You may need to reschedule your appointment if you arrive late (15 or more minutes).  Arriving late affects you and other patients whose appointments are after yours.  Also, if you miss three or more appointments without notifying the office, you may be dismissed from the clinic at the provider's discretion.      For prescription refill requests, have your pharmacy contact our office and allow 72 hours for refills to be completed.    Today you received the following chemotherapy and/or immunotherapy agents Trodelvy      To help prevent nausea and vomiting after your treatment, we encourage you to take your nausea medication as directed.  BELOW ARE SYMPTOMS THAT SHOULD BE REPORTED IMMEDIATELY: *FEVER GREATER THAN 100.4 F (38 C) OR HIGHER *CHILLS OR SWEATING *NAUSEA AND VOMITING THAT IS NOT CONTROLLED WITH YOUR NAUSEA MEDICATION *UNUSUAL SHORTNESS OF BREATH *UNUSUAL BRUISING OR BLEEDING *URINARY PROBLEMS (pain or burning when urinating, or frequent urination) *BOWEL PROBLEMS (unusual diarrhea, constipation, pain near the anus) TENDERNESS IN MOUTH AND THROAT WITH OR WITHOUT PRESENCE OF ULCERS (sore throat, sores in mouth, or a toothache) UNUSUAL RASH, SWELLING OR PAIN  UNUSUAL VAGINAL DISCHARGE OR ITCHING   Items with * indicate a potential emergency and should be followed up as soon as possible or go to the Emergency Department if any problems should occur.  Please show the CHEMOTHERAPY ALERT CARD or IMMUNOTHERAPY ALERT CARD at check-in to the  Emergency Department and triage nurse.  Should you have questions after your visit or need to cancel or reschedule your appointment, please contact [facility] [phone]  and follow the prompts.  Office hours are 8:00 a.m. to 4:30 p.m. Monday - Friday. Please note that voicemails left after 4:00 p.m. may not be returned until the following business day.  We are closed weekends and major holidays. You have access to a nurse at all times for urgent questions. Please call the main number to the clinic [phone] and follow the prompts.  For any non-urgent questions, you may also contact your provider using MyChart. We now offer e-Visits for anyone 18 and older to request care online for non-urgent symptoms. For details visit [email or web address].   Also download the MyChart app! Go to the app store, search "MyChart", open the app, select [facility], and log in with your MyChart username and password.   

## 2022-10-14 ENCOUNTER — Inpatient Hospital Stay: Admission: RE | Admit: 2022-10-14 | Payer: Medicare Other | Source: Ambulatory Visit

## 2022-10-15 ENCOUNTER — Inpatient Hospital Stay: Payer: Medicare Other

## 2022-10-15 ENCOUNTER — Encounter: Payer: Self-pay | Admitting: Hematology

## 2022-10-15 ENCOUNTER — Inpatient Hospital Stay: Payer: Medicare Other | Attending: Hematology | Admitting: Hematology

## 2022-10-15 VITALS — BP 147/82 | HR 85 | Temp 99.8°F | Resp 18 | Wt 158.5 lb

## 2022-10-15 DIAGNOSIS — Z171 Estrogen receptor negative status [ER-]: Secondary | ICD-10-CM | POA: Diagnosis not present

## 2022-10-15 DIAGNOSIS — Z801 Family history of malignant neoplasm of trachea, bronchus and lung: Secondary | ICD-10-CM | POA: Insufficient documentation

## 2022-10-15 DIAGNOSIS — K589 Irritable bowel syndrome without diarrhea: Secondary | ICD-10-CM | POA: Diagnosis not present

## 2022-10-15 DIAGNOSIS — C50912 Malignant neoplasm of unspecified site of left female breast: Secondary | ICD-10-CM | POA: Insufficient documentation

## 2022-10-15 DIAGNOSIS — Z5111 Encounter for antineoplastic chemotherapy: Secondary | ICD-10-CM | POA: Insufficient documentation

## 2022-10-15 DIAGNOSIS — Z79899 Other long term (current) drug therapy: Secondary | ICD-10-CM | POA: Insufficient documentation

## 2022-10-15 DIAGNOSIS — Z803 Family history of malignant neoplasm of breast: Secondary | ICD-10-CM | POA: Diagnosis not present

## 2022-10-15 DIAGNOSIS — Z8719 Personal history of other diseases of the digestive system: Secondary | ICD-10-CM | POA: Diagnosis not present

## 2022-10-15 DIAGNOSIS — C7931 Secondary malignant neoplasm of brain: Secondary | ICD-10-CM | POA: Diagnosis not present

## 2022-10-15 DIAGNOSIS — J45909 Unspecified asthma, uncomplicated: Secondary | ICD-10-CM | POA: Insufficient documentation

## 2022-10-15 DIAGNOSIS — C7951 Secondary malignant neoplasm of bone: Secondary | ICD-10-CM | POA: Insufficient documentation

## 2022-10-15 DIAGNOSIS — D702 Other drug-induced agranulocytosis: Secondary | ICD-10-CM

## 2022-10-15 DIAGNOSIS — I10 Essential (primary) hypertension: Secondary | ICD-10-CM | POA: Insufficient documentation

## 2022-10-15 DIAGNOSIS — G629 Polyneuropathy, unspecified: Secondary | ICD-10-CM | POA: Diagnosis not present

## 2022-10-15 DIAGNOSIS — Z9012 Acquired absence of left breast and nipple: Secondary | ICD-10-CM | POA: Insufficient documentation

## 2022-10-15 DIAGNOSIS — Z79890 Hormone replacement therapy (postmenopausal): Secondary | ICD-10-CM | POA: Insufficient documentation

## 2022-10-15 DIAGNOSIS — E039 Hypothyroidism, unspecified: Secondary | ICD-10-CM | POA: Insufficient documentation

## 2022-10-15 LAB — COMPREHENSIVE METABOLIC PANEL
ALT: 15 U/L (ref 0–44)
AST: 18 U/L (ref 15–41)
Albumin: 3.4 g/dL — ABNORMAL LOW (ref 3.5–5.0)
Alkaline Phosphatase: 89 U/L (ref 38–126)
Anion gap: 8 (ref 5–15)
BUN: 17 mg/dL (ref 8–23)
CO2: 22 mmol/L (ref 22–32)
Calcium: 8.7 mg/dL — ABNORMAL LOW (ref 8.9–10.3)
Chloride: 103 mmol/L (ref 98–111)
Creatinine, Ser: 0.66 mg/dL (ref 0.44–1.00)
GFR, Estimated: >60 mL/min (ref >60)
Glucose, Bld: 95 mg/dL (ref 70–99)
Potassium: 4.1 mmol/L (ref 3.5–5.1)
Sodium: 133 mmol/L — ABNORMAL LOW (ref 135–145)
Total Bilirubin: 0.4 mg/dL (ref 0.3–1.2)
Total Protein: 7 g/dL (ref 6.5–8.1)

## 2022-10-15 LAB — CBC WITH DIFFERENTIAL/PLATELET
Abs Immature Granulocytes: 0.01 10*3/uL (ref 0.00–0.07)
Basophils Absolute: 0 10*3/uL (ref 0.0–0.1)
Basophils Relative: 0 %
Eosinophils Absolute: 0.1 10*3/uL (ref 0.0–0.5)
Eosinophils Relative: 3 %
HCT: 30.4 % — ABNORMAL LOW (ref 36.0–46.0)
Hemoglobin: 9.9 g/dL — ABNORMAL LOW (ref 12.0–15.0)
Immature Granulocytes: 0 %
Lymphocytes Relative: 32 %
Lymphs Abs: 1 10*3/uL (ref 0.7–4.0)
MCH: 33.3 pg (ref 26.0–34.0)
MCHC: 32.6 g/dL (ref 30.0–36.0)
MCV: 102.4 fL — ABNORMAL HIGH (ref 80.0–100.0)
Monocytes Absolute: 0.2 10*3/uL (ref 0.1–1.0)
Monocytes Relative: 7 %
Neutro Abs: 1.8 10*3/uL (ref 1.7–7.7)
Neutrophils Relative %: 58 %
Platelets: 190 10*3/uL (ref 150–400)
RBC: 2.97 MIL/uL — ABNORMAL LOW (ref 3.87–5.11)
RDW: 13 % (ref 11.5–15.5)
WBC: 3.1 10*3/uL — ABNORMAL LOW (ref 4.0–10.5)
nRBC: 0 % (ref 0.0–0.2)

## 2022-10-15 LAB — MAGNESIUM: Magnesium: 1.8 mg/dL (ref 1.7–2.4)

## 2022-10-15 MED ORDER — MAGNESIUM SULFATE 2 GM/50ML IV SOLN
2.0000 g | Freq: Once | INTRAVENOUS | Status: AC
  Administered 2022-10-15: 2 g via INTRAVENOUS
  Filled 2022-10-15: qty 50

## 2022-10-15 MED ORDER — SODIUM CHLORIDE 0.9% FLUSH
10.0000 mL | Freq: Once | INTRAVENOUS | Status: AC
  Administered 2022-10-15: 10 mL via INTRAVENOUS

## 2022-10-15 MED ORDER — HEPARIN SOD (PORK) LOCK FLUSH 100 UNIT/ML IV SOLN
500.0000 [IU] | Freq: Once | INTRAVENOUS | Status: AC
  Administered 2022-10-15: 500 [IU] via INTRAVENOUS

## 2022-10-15 MED ORDER — SODIUM CHLORIDE 0.9% FLUSH
10.0000 mL | INTRAVENOUS | Status: AC
  Administered 2022-10-15: 10 mL

## 2022-10-15 MED ORDER — POTASSIUM CHLORIDE IN NACL 20-0.9 MEQ/L-% IV SOLN
Freq: Once | INTRAVENOUS | Status: AC
  Filled 2022-10-15: qty 1000

## 2022-10-15 NOTE — Progress Notes (Signed)
Patients port flushed without difficulty.  Good blood return noted with no bruising or swelling noted at site.  Patient to remain accessed for treatment.  

## 2022-10-15 NOTE — Progress Notes (Signed)
No treatment today per MD. Will give fluids with magnesium and potassium per orders.

## 2022-10-15 NOTE — Progress Notes (Signed)
Patient presents today for treatment and follow up visit with Dr. Delton Coombes. Patient has complaints of nausea, vomiting, and diarrhea since 5 am. Patient has had one episode of diarrhea while here per patient's words. Treatment held today per Dr. Delton Coombes / A. [place] Management consultant. Patient will receive house fluids today and tomorrow per secure chart Dr. Delton Coombes  / A. Beckie Salts.   House fluids given today per MD orders. Tolerated infusion without adverse affects. Vital signs stable. No complaints at this time. Discharged from clinic ambulatory in stable condition. Alert and oriented x 3. F/U with [facility] as scheduled.

## 2022-10-15 NOTE — Progress Notes (Signed)
[place], [place] [address]   CLINIC:  Medical Oncology/Hematology  PCP:  Neale Burly, MD [place] / [place] [id number] [phone]   REASON FOR VISIT:  Metastatic triple negative breast cancer to the bones.  PRIOR THERAPY: 1.  Neoadjuvant chemotherapy with weekly carboplatin and paclitaxel and dose dense AC with pembrolizumab  2. Left mastectomy and lymph node excision by Dr. Ninfa Linden on 11/20/2021  NGS Results: Requested and pending  CURRENT THERAPY: Sacituzumab govitecan  BRIEF ONCOLOGIC HISTORY:  Oncology History  Invasive lobular carcinoma of left breast in female [facility])  01/23/2021 Initial Diagnosis   Invasive lobular carcinoma of left breast in female [facility])   02/19/2021 Genetic Testing   Negative genetic testing on the CancerNext-Expanded+RNAinsight panel.  SMARCB1 VUS identified.  The CancerNext-Expanded gene panel offered by [facility] and includes sequencing and rearrangement analysis for the following 77 genes: AIP, ALK, APC*, ATM*, AXIN2, BAP1, BARD1, BLM, BMPR1A, BRCA1*, BRCA2*, BRIP1*, CDC73, CDH1*, CDK4, CDKN1B, CDKN2A, CHEK2*, CTNNA1, DICER1, FANCC, FH, FLCN, GALNT12, KIF1B, LZTR1, MAX, MEN1, MET, MLH1*, MSH2*, MSH3, MSH6*, MUTYH*, NBN, NF1*, NF2, NTHL1, PALB2*, PHOX2B, PMS2*, POT1, PRKAR1A, PTCH1, PTEN*, RAD51C*, RAD51D*, RB1, RECQL, RET, SDHA, SDHAF2, SDHB, SDHC, SDHD, SMAD4, SMARCA4, SMARCB1, SMARCE1, STK11, SUFU, TMEM127, TP53*, TSC1, TSC2, VHL and XRCC2 (sequencing and deletion/duplication); EGFR, EGLN1, HOXB13, KIT, MITF, PDGFRA, POLD1, and POLE (sequencing only); EPCAM and GREM1 (deletion/duplication only). DNA and RNA analyses performed for * genes. The report date is February 19, 2021.   02/27/2021 - 05/11/2022 Chemotherapy   Patient is on Treatment Plan : BREAST Pembrolizumab + Carboplatin D1,8,15+ Paclitaxel D1,8,15 q21d X 4 cycles / Pembrolizumab + AC q21d x 4 cycles     12/22/2021 Cancer Staging   Staging  form: Breast, AJCC 8th Edition - Pathologic stage from 12/22/2021: No Stage Recommended (ypT3, pN3a, cM0, G2, ER-, PR-, HER2-) - Signed by Derek Jack, MD on 12/22/2021 Histopathologic type: Lobular carcinoma, NOS Stage prefix: Post-therapy Method of lymph node assessment: Axillary lymph node dissection Multigene prognostic tests performed: None Histologic grading system: 3 grade system   08/18/2022 -  Chemotherapy   Patient is on Treatment Plan : BREAST METASTATIC Sacituzumab govitecan-hziy Ivette Loyal) D1,8 q21d       CANCER STAGING:  Cancer Staging  Invasive lobular carcinoma of left breast in female [facility]) Staging form: Breast, AJCC 8th Edition - Clinical stage from 01/23/2021: cT3, cN3c, G2, ER-, PR-, HER2- - Unsigned - Pathologic stage from 12/22/2021: No Stage Recommended (ypT3, pN3a, cM0, G2, ER-, PR-, HER2-) - Signed by Derek Jack, MD on 12/22/2021   INTERVAL HISTORY:  Ms. MARGUARITE MARKOV, a 72 y.o. female, seen for follow-up of metastatic triple negative breast cancer to the bones. She was last seen by me on 09/28/22.  Today, she presents for new onset vomiting/diarrhea. 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 She feels that she overdid it with the repetitive bending and lifting. She has had intermittent mid abdomen upset and a gurgling sensation, but she denies any pain. She reports having vomiting and diarrhea lasting for ~ 5 hours this morning. Her emesis is yellow and her diarrhea is watery. She has been unable to hold down water or hot tea since her symptoms began this morning. She is now dry heaving after so many episodes of vomiting. She has some blood streaking with wiping prior to her  visit which she associates with her hemorrhoids. She reports having similar symptoms periodically secondary to her IBS. Typically she can alleviate symptoms with resting and avoiding PO  intake. She denies any lightheadedness with standing.  We are holding off on cycle 2 day 8 of her Trodelvy today.   REVIEW OF SYSTEMS:  Review of Systems  Constitutional:  Positive for appetite change. Negative for chills, fatigue and fever.  HENT:   Negative for lump/mass, mouth sores, nosebleeds, sore throat and trouble swallowing.   Eyes:  Negative for eye problems.  Respiratory:  Negative for cough.   Cardiovascular:  Negative for chest pain, leg swelling and palpitations.  Gastrointestinal:  Positive for diarrhea, nausea and vomiting. Negative for abdominal pain and constipation.  Genitourinary:  Negative for bladder incontinence, difficulty urinating, dysuria, frequency, hematuria and nocturia.   Musculoskeletal:  Negative for arthralgias, back pain, flank pain, myalgias and neck pain.  Skin:  Negative for itching and rash.  Neurological:  Positive for headaches. Negative for dizziness and numbness.  Hematological:  Does not bruise/bleed easily.  Psychiatric/Behavioral:  Negative for depression, sleep disturbance and suicidal ideas. The patient is not nervous/anxious.   All other systems reviewed and are negative.   PAST MEDICAL/SURGICAL HISTORY:  Past Medical History:  Diagnosis Date   Asthma    as child   Cancer ([place]) 12/2020   left breast IMC   Complication of anesthesia    pateitn states,' I coded when I had my D&Cmany years ago.   Dyspnea    Family history of breast cancer    Hypertension    Hypothyroidism    PONV (postoperative nausea and vomiting)    Port-A-Cath in place 02/26/2021   Past Surgical History:  Procedure Laterality Date   ABDOMINAL HYSTERECTOMY     BIOPSY  01/17/2018   Procedure: BIOPSY;  Surgeon: Rogene Houston, MD;  [facility];  Service: Endoscopy;;  duodenum,gastric   CHOLECYSTECTOMY     COLONOSCOPY WITH PROPOFOL N/A 01/17/2018   Procedure: COLONOSCOPY WITH PROPOFOL;  Surgeon: Rogene Houston, MD;  [facility];   Service: Endoscopy;  Laterality: N/A;  7:30   DILATION AND CURETTAGE OF UTERUS     ESOPHAGOGASTRODUODENOSCOPY (EGD) WITH PROPOFOL N/A 01/17/2018   Procedure: ESOPHAGOGASTRODUODENOSCOPY (EGD) WITH PROPOFOL;  Surgeon: Rogene Houston, MD;  [facility];  Service: Endoscopy;  Laterality: N/A;   POLYPECTOMY  01/17/2018   Procedure: POLYPECTOMY;  Surgeon: Rogene Houston, MD;  [facility];  Service: Endoscopy;;  transverse colon, cecal   PORTACATH PLACEMENT Right 02/17/2021   Procedure: INSERTION PORT-A-CATH;  Surgeon: Coralie Keens, MD;  Location: [place];  Service: General;  Laterality: Right;   RADIOACTIVE SEED GUIDED AXILLARY SENTINEL LYMPH NODE Left 11/20/2021   Procedure: RADIOACTIVE SEED GUIDED LEFT AXILLARY SENTINEL LYMPH NODE DISSECTION;  Surgeon: Coralie Keens, MD;  Location: [place];  Service: General;  Laterality: Left;   TOTAL MASTECTOMY Left 11/20/2021   Procedure: LEFT TOTAL MASTECTOMY;  Surgeon: Coralie Keens, MD;  Location: [facility];  Service: General;  Laterality: Left;    SOCIAL HISTORY:  Social History   Socioeconomic History   Marital status: Widowed    Spouse name: Not on file   Number of children: 3   Years of education: Not on file   Highest education level: Not on file  Occupational History   Occupation: [employer]   Occupation: retired  Tobacco Use   Smoking  status: Never   Smokeless tobacco: Never  Vaping Use   Vaping Use: Never used  Substance and Sexual Activity   Alcohol use: Never   Drug use: Never   Sexual activity: Not Currently    Birth control/protection: Surgical  Other Topics Concern   Not on file  Social History Narrative   Not on file   Social Determinants of Health   Financial Resource Strain: Low Risk  (07/10/2022)   Overall Financial Resource Strain (CARDIA)    Difficulty of Paying Living Expenses: Not hard at all  Food Insecurity: No Food  Insecurity (06/30/2022)   Hunger Vital Sign    Worried About Running Out of Food in the Last Year: Never true    Ran Out of Food in the Last Year: Never true  Transportation Needs: No Transportation Needs (07/10/2022)   PRAPARE - Hydrologist (Medical): No    Lack of Transportation (Non-Medical): No  Physical Activity: Insufficiently Active (01/22/2021)   Exercise Vital Sign    Days of Exercise per Week: 3 days    Minutes of Exercise per Session: 30 min  Stress: No Stress Concern Present (01/22/2021)   [facility]    Feeling of Stress : Not at all  Social Connections: Moderately Integrated (01/22/2021)   Social Connection and Isolation Panel [NHANES]    Frequency of Communication with Friends and Family: More than three times a week    Frequency of Social Gatherings with Friends and Family: More than three times a week    Attends Religious Services: More than 4 times per year    Active Member of [employer] or Organizations: No    Attends Music therapist: More than 4 times per year    Marital Status: Widowed  Intimate Partner Violence: Not At Risk (05/28/2022)   Humiliation, Afraid, Rape, and Kick questionnaire    Fear of Current or Ex-Partner: No    Emotionally Abused: No    Physically Abused: No    Sexually Abused: No    FAMILY HISTORY:  Family History  Problem Relation Age of Onset   Breast cancer Mother        dx in her 48s   Heart disease Mother    Thyroid disease Mother    Lung cancer Father        dx in his 69s   Thyroid disease Maternal Aunt    Thyroid nodules Maternal Grandmother 35       goiter   Thyroid disease Maternal Grandmother    Heart disease Maternal Grandfather    Heart disease Paternal Grandmother    Heart disease Paternal Grandfather    Thyroid disease Daughter    Thyroid disease Daughter    Cancer Maternal Uncle        NOS   Cancer Paternal Uncle         NOS   Breast cancer Cousin        pat first cousin died in her 48s;     CURRENT MEDICATIONS:  Current Outpatient Medications  Medication Sig Dispense Refill   Acetaminophen (TYLENOL PO) Take 1 tablet by mouth as needed.     gabapentin (NEURONTIN) 300 MG capsule Take 1 capsule by mouth at bedtime.     hydrALAZINE (APRESOLINE) 25 MG tablet Take 25 mg by mouth 3 (three) times daily.     levothyroxine (SYNTHROID) 75 MCG tablet Take 100 mcg by mouth daily before breakfast.  Melatonin 5 MG TABS Take 10 mg by mouth. As needed for sleep     olmesartan-hydrochlorothiazide (BENICAR HCT) 40-25 MG tablet Take 1 tablet by mouth daily.     pantoprazole (PROTONIX) 40 MG tablet Take 40 mg by mouth daily.     predniSONE (DELTASONE) 50 MG tablet Take one tab po 13 hours prior, then one tab 7 hours prior, and then one tab 1 hour prior to MRI 3 tablet 0   promethazine (PHENERGAN) 25 MG tablet Take by mouth.     No current facility-administered medications for this visit.    ALLERGIES:  Allergies  Allergen Reactions   Exforge [Amlodipine Besylate-Valsartan] Nausea And Vomiting   Gadolinium Derivatives Hives, Itching and Anaphylaxis    13hr prep prior to contrast admin  MRI iv contrast   Gadopiclenol Hives and Itching    13 hr prep prior to contrast admin    Tape Other (See Comments)    Redness and Irriatation   Cheese     Hard cheese   Levofloxacin In D5w Hives   Other    Proanthocyanidin    Strawberry Extract Diarrhea    Seeds,nuts, lettuce, grapes   Wild Lettuce Extract (Lactuca Virosa)    Yeast-Related Products Hives    Mold on bread    PHYSICAL EXAM:  Performance status (ECOG): 1 - Symptomatic but completely ambulatory  There were no vitals filed for this visit.   Wt Readings from Last 3 Encounters:  10/08/22 72.8 kg (160 lb 9.6 oz)  09/28/22 73.6 kg (162 lb 3.2 oz)  09/09/22 72.3 kg (159 lb 8 oz)   Physical Exam Vitals reviewed. Exam conducted with a chaperone  present.  Constitutional:      Appearance: Normal appearance.  Cardiovascular:     Rate and Rhythm: Normal rate and regular rhythm.     Pulses: Normal pulses.     Heart sounds: Normal heart sounds.  Pulmonary:     Effort: Pulmonary effort is normal.     Breath sounds: Normal breath sounds.  Abdominal:     General: Bowel sounds are increased.     Palpations: Abdomen is soft. There is no hepatomegaly, splenomegaly or mass.     Tenderness: There is no abdominal tenderness.  Lymphadenopathy:     Upper Body:     Right upper body: No supraclavicular, axillary or pectoral adenopathy.     Left upper body: No supraclavicular, axillary or pectoral adenopathy.  Neurological:     General: No focal deficit present.     Mental Status: She is alert and oriented to person, place, and time.  Psychiatric:        Mood and Affect: Mood normal.        Behavior: Behavior normal.     LABORATORY DATA:  I have reviewed the labs as listed.     Latest Ref Rng & Units 10/15/2022   11:08 AM 10/08/2022   12:00 PM 09/28/2022   11:50 AM  CBC  WBC 4.0 - 10.5 K/uL 3.1  4.6  5.3   Hemoglobin 12.0 - 15.0 g/dL 9.9  10.8  9.8   Hematocrit 36.0 - 46.0 % 30.4  33.2  31.0   Platelets 150 - 400 K/uL 190  237  259       Latest Ref Rng & Units 10/08/2022   12:00 PM 09/28/2022   11:50 AM 09/09/2022   10:49 AM  CMP  Glucose 70 - 99 mg/dL 128  95  100   BUN 8 -  23 mg/dL '25  19  11   '$ Creatinine 0.44 - 1.00 mg/dL 0.85  0.73  0.69   Sodium 135 - 145 mmol/L 132  135  139   Potassium 3.5 - 5.1 mmol/L 3.6  3.8  3.4   Chloride 98 - 111 mmol/L 100  103  105   CO2 22 - 32 mmol/L '23  23  25   '$ Calcium 8.9 - 10.3 mg/dL 9.0  8.6  8.5   Total Protein 6.5 - 8.1 g/dL 7.8  7.2  7.0   Total Bilirubin 0.3 - 1.2 mg/dL 0.5  0.2  0.6   Alkaline Phos 38 - 126 U/L 101  98  87   AST 15 - 41 U/L '26  23  14   '$ ALT 0 - 44 U/L '16  14  9     '$ DIAGNOSTIC IMAGING:  I have independently reviewed the scans and discussed with the patient. No  results found.   ASSESSMENT:  1.  T3N3c (stage IIIc) triple negative invasive lobular carcinoma of the left breast: - She felt lump in her left breast for more than 6 months, but thought it was secondary to fibrocystic disease which she had all her life.  When she started having pain, she reached out to Dr. Sherrie Sport. - Ultrasound-guided left breast and left axillary lymph node biopsy on 01/15/2021 - Pathology consistent with invasive lobular carcinoma, E-cadherin negative.  ER/PR/HER2 negative.  HER2 2+ by IHC, negative by FISH.  Ki-67 is 5%.  Lymph node core biopsy was consistent with metastatic carcinoma.  Grade 2. - PET scan on 02/03/2021 showed involvement of left supraclavicular, subpectoral, axillary lymph nodes along with the breast mass.  10 mm left lung nodule which is hypometabolic.  Spinal cord lesion at T12 level with a strong uptake. - MRI of the lumbar spine with and without contrast on 02/20/2021 showed no mass or abnormal enhancement within the canal at the T12 level to correspond to the site of PET scan positive.  No marrow replacing bone lesion. - 12 weeks of carboplatin and paclitaxel weekly and every 3 weeks pembrolizumab followed by 4 cycles of dose dense AC with pembrolizumab ([id number]) from 02/27/2021 through 10/01/2021 - PET scan on 10/16/2021: Reduction in metabolic activity in the size of the left axillary lymph node and no evidence of breast hypermetabolism on PET scan. - Left mastectomy and lymph node excision by Dr. Ninfa Linden on 11/20/2021 - Pathology: 6.2 cm invasive lobular carcinoma, grade 2, margins negative.  19/21 lymph nodes involved with macrometastasis.  ypT3, YPN3A - Adjuvant pembrolizumab started on 01/01/2022. -CREATE-X capecitabine 1500 mg twice daily 2 weeks on/1 week off started on 01/01/2022.  Last dose of pembrolizumab on 05/11/2022 - Germline mutation testing ([facility]) negative - [facility] (06/04/2022): T p53, K-ras V14 I, CDKN2A.  MSI-high not detected. -  PET scan (05/28/2022): Multifocal bone lesions involving T11 vertebral body, right posterior sixth rib, left femoral neck, left scapular glenoid.  No other metastatic disease. - Sacituzumab cycle 1 on 08/18/2022.  She was hospitalized with neutropenic fever and severe diarrhea from 08/24/2022 through 08/27/2022. - CTAP (10/25/2021): Generalized mild to moderate wall thickening throughout the nondistended large bowel with faint pericolonic fat haziness compatible with nonspecific infectious/inflammatory colitis.  No free air.  C. difficile x 2 was negative. - She has UG T1 A1*28 heterozygosity. - Cycle 2 Sacituzumab dose reduced by 50%.     2.  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  She is non-smoker. - Mother died of breast cancer.  Maternal grandmother died very young in her 35s, sister has fibrocystic disease.  Father died of lung cancer and was a smoker.   PLAN:  1.  Metastatic TNBC to the bones: - She received cycle 2 Sacituzumab at 50% dose on 10/08/2022.  She did well until this morning when she had dry heaves and diarrhea which started about 4:30 AM.  She reportedly overworked yesterday.  She reports mild soreness in the lower abdomen.  She attributes it to her IBS and colitis. - Reviewed labs today which showed normal LFTs, creatinine and electrolytes.  CBC shows white count 3.1 with normal ANC.  Platelets are 190. - We will hold her treatment today.  She will receive 1 L fluid with electrolytes over 90 minutes. - We will see her back in 1 week for follow-up.  Will likely discontinue Sacituzumab if the diarrhea does not resolve.  We have also tentatively scheduled her for fluids tomorrow.  Will consider Enhertu as the next option.   2.  Brain lesions (SRS to 3 brain lesions on 06/16/2022): - Brain MRI on 07/15/2022: Stable to slight interval decrease in size of intracranial metastatic disease with no new lesions. - She reportedly had  reaction/hypersensitivity to vueway contrast.  She will need another MRI of the brain.  3.  Peripheral neuropathy: - Neuropathy in the toes has been stable.  4.  Hypomagnesemia: - She is unable to take magnesium due to diarrhea.  Magnesium is surprisingly normal today at 1.8 despite diarrhea.    Orders placed this encounter:  No orders of the defined types were placed in this encounter.     I,Alexis Herring,acting as a Education administrator for [employer], MD.,have documented all relevant documentation on the behalf of Derek Jack, MD,as directed by  Derek Jack, MD while in the presence of Derek Jack, MD.  I, Derek Jack MD, have reviewed the above documentation for accuracy and completeness, and I agree with the above.   Derek Jack, MD [place] [phone]

## 2022-10-15 NOTE — Patient Instructions (Signed)
[facility]  Discharge Instructions: Thank you for choosing [facility] to provide your oncology and hematology care.  If you have a lab appointment with the [facility], please come in thru the Main Entrance and check in at the main information desk.  Wear comfortable clothing and clothing appropriate for easy access to any Portacath or PICC line.   We strive to give you quality time with your provider. You may need to reschedule your appointment if you arrive late (15 or more minutes).  Arriving late affects you and other patients whose appointments are after yours.  Also, if you miss three or more appointments without notifying the office, you may be dismissed from the clinic at the provider's discretion.      For prescription refill requests, have your pharmacy contact our office and allow 72 hours for refills to be completed.    Today you received the following chemotherapy and/or immunotherapy agents IV hydration.       To help prevent nausea and vomiting after your treatment, we encourage you to take your nausea medication as directed.  BELOW ARE SYMPTOMS THAT SHOULD BE REPORTED IMMEDIATELY: *FEVER GREATER THAN 100.4 F (38 C) OR HIGHER *CHILLS OR SWEATING *NAUSEA AND VOMITING THAT IS NOT CONTROLLED WITH YOUR NAUSEA MEDICATION *UNUSUAL SHORTNESS OF BREATH *UNUSUAL BRUISING OR BLEEDING *URINARY PROBLEMS (pain or burning when urinating, or frequent urination) *BOWEL PROBLEMS (unusual diarrhea, constipation, pain near the anus) TENDERNESS IN MOUTH AND THROAT WITH OR WITHOUT PRESENCE OF ULCERS (sore throat, sores in mouth, or a toothache) UNUSUAL RASH, SWELLING OR PAIN  UNUSUAL VAGINAL DISCHARGE OR ITCHING   Items with * indicate a potential emergency and should be followed up as soon as possible or go to the Emergency Department if any problems should occur.  Please show the CHEMOTHERAPY ALERT CARD or IMMUNOTHERAPY ALERT CARD at check-in to the  Emergency Department and triage nurse.  Should you have questions after your visit or need to cancel or reschedule your appointment, please contact [facility] [phone]  and follow the prompts.  Office hours are 8:00 a.m. to 4:30 p.m. Monday - Friday. Please note that voicemails left after 4:00 p.m. may not be returned until the following business day.  We are closed weekends and major holidays. You have access to a nurse at all times for urgent questions. Please call the main number to the clinic [phone] and follow the prompts.  For any non-urgent questions, you may also contact your provider using MyChart. We now offer e-Visits for anyone 9 and older to request care online for non-urgent symptoms. For details visit [email or web address].   Also download the MyChart app! Go to the app store, search "MyChart", open the app, select , and log in with your MyChart username and password.

## 2022-10-15 NOTE — Patient Instructions (Addendum)
[place] at [facility] Discharge Instructions   You were seen and examined today by Dr. Delton Coombes.  He reviewed part of your blood work. Your CBC is normal/stable. Liver and kidney numbers, and electrolytes are pending.   We will hold treatment today. We will give you IV fluids only today. We will schedule you for fluids tomorrow as well. You may call and cancel this appointment if you are able to eat and drink and the diarrhea subsides.  Return as scheduled in one week.    Thank you for choosing [place] at [facility] to provide your oncology and hematology care.  To afford each patient quality time with our provider, please arrive at least 15 minutes before your scheduled appointment time.   If you have a lab appointment with the [facility] please come in thru the Main Entrance and check in at the main information desk.  You need to re-schedule your appointment should you arrive 10 or more minutes late.  We strive to give you quality time with our providers, and arriving late affects you and other patients whose appointments are after yours.  Also, if you no show three or more times for appointments you may be dismissed from the clinic at the providers discretion.     Again, thank you for choosing [facility].  Our hope is that these requests will decrease the amount of time that you wait before being seen by our physicians.       _____________________________________________________________  Should you have questions after your visit to [facility], please contact our office at [phone] and follow the prompts.  Our office hours are 8:00 a.m. and 4:30 p.m. Monday - Friday.  Please note that voicemails left after 4:00 p.m. may not be returned until the following business day.  We are closed weekends and major holidays.  You do have access to a nurse 24-7, just call the main number to the clinic [phone]  and do not press any options, hold on the line and a nurse will answer the phone.    For prescription refill requests, have your pharmacy contact our office and allow 72 hours.    Due to Covid, you will need to wear a mask upon entering the hospital. If you do not have a mask, a mask will be given to you at the Main Entrance upon arrival. For doctor visits, patients may have 1 support person age 87 or older with them. For treatment visits, patients can not have anyone with them due to social distancing guidelines and our immunocompromised population.

## 2022-10-16 ENCOUNTER — Inpatient Hospital Stay: Payer: Medicare Other

## 2022-10-16 VITALS — BP 114/72 | HR 64 | Temp 99.2°F | Resp 16

## 2022-10-16 DIAGNOSIS — D702 Other drug-induced agranulocytosis: Secondary | ICD-10-CM

## 2022-10-16 DIAGNOSIS — C50912 Malignant neoplasm of unspecified site of left female breast: Secondary | ICD-10-CM | POA: Diagnosis not present

## 2022-10-16 DIAGNOSIS — Z171 Estrogen receptor negative status [ER-]: Secondary | ICD-10-CM | POA: Diagnosis not present

## 2022-10-16 DIAGNOSIS — C7951 Secondary malignant neoplasm of bone: Secondary | ICD-10-CM | POA: Diagnosis not present

## 2022-10-16 DIAGNOSIS — G629 Polyneuropathy, unspecified: Secondary | ICD-10-CM | POA: Diagnosis not present

## 2022-10-16 DIAGNOSIS — C7931 Secondary malignant neoplasm of brain: Secondary | ICD-10-CM | POA: Diagnosis not present

## 2022-10-16 DIAGNOSIS — Z5111 Encounter for antineoplastic chemotherapy: Secondary | ICD-10-CM | POA: Diagnosis not present

## 2022-10-16 MED ORDER — SODIUM CHLORIDE 0.9% FLUSH
10.0000 mL | INTRAVENOUS | Status: DC | PRN
  Administered 2022-10-16: 10 mL via INTRAVENOUS

## 2022-10-16 MED ORDER — MAGNESIUM SULFATE 2 GM/50ML IV SOLN
2.0000 g | Freq: Once | INTRAVENOUS | Status: AC
  Administered 2022-10-16: 2 g via INTRAVENOUS
  Filled 2022-10-16: qty 50

## 2022-10-16 MED ORDER — POTASSIUM CHLORIDE IN NACL 20-0.9 MEQ/L-% IV SOLN
Freq: Once | INTRAVENOUS | Status: AC
  Filled 2022-10-16: qty 1000

## 2022-10-16 MED ORDER — HEPARIN SOD (PORK) LOCK FLUSH 100 UNIT/ML IV SOLN
500.0000 [IU] | Freq: Once | INTRAVENOUS | Status: AC
  Administered 2022-10-16: 500 [IU] via INTRAVENOUS

## 2022-10-16 NOTE — Progress Notes (Signed)
Hydration fluids with magnesium and potassium given per orders. Patient tolerated it well without problems. Vitals stable and discharged home from clinic ambulatory. Follow up as scheduled.

## 2022-10-16 NOTE — Patient Instructions (Signed)
[facility]  Discharge Instructions: Thank you for choosing [facility] to provide your oncology and hematology care.  If you have a lab appointment with the [facility], please come in thru the Main Entrance and check in at the main information desk.  Wear comfortable clothing and clothing appropriate for easy access to any Portacath or PICC line.   We strive to give you quality time with your provider. You may need to reschedule your appointment if you arrive late (15 or more minutes).  Arriving late affects you and other patients whose appointments are after yours.  Also, if you miss three or more appointments without notifying the office, you may be dismissed from the clinic at the provider's discretion.      For prescription refill requests, have your pharmacy contact our office and allow 72 hours for refills to be completed.    Today you received the following, hydration fluids with Magnesium and potassium      To help prevent nausea and vomiting after your treatment, we encourage you to take your nausea medication as directed.  BELOW ARE SYMPTOMS THAT SHOULD BE REPORTED IMMEDIATELY: *FEVER GREATER THAN 100.4 F (38 C) OR HIGHER *CHILLS OR SWEATING *NAUSEA AND VOMITING THAT IS NOT CONTROLLED WITH YOUR NAUSEA MEDICATION *UNUSUAL SHORTNESS OF BREATH *UNUSUAL BRUISING OR BLEEDING *URINARY PROBLEMS (pain or burning when urinating, or frequent urination) *BOWEL PROBLEMS (unusual diarrhea, constipation, pain near the anus) TENDERNESS IN MOUTH AND THROAT WITH OR WITHOUT PRESENCE OF ULCERS (sore throat, sores in mouth, or a toothache) UNUSUAL RASH, SWELLING OR PAIN  UNUSUAL VAGINAL DISCHARGE OR ITCHING   Items with * indicate a potential emergency and should be followed up as soon as possible or go to the Emergency Department if any problems should occur.  Please show the CHEMOTHERAPY ALERT CARD or IMMUNOTHERAPY ALERT CARD at check-in to the Emergency  Department and triage nurse.  Should you have questions after your visit or need to cancel or reschedule your appointment, please contact [facility] [phone]  and follow the prompts.  Office hours are 8:00 a.m. to 4:30 p.m. Monday - Friday. Please note that voicemails left after 4:00 p.m. may not be returned until the following business day.  We are closed weekends and major holidays. You have access to a nurse at all times for urgent questions. Please call the main number to the clinic [phone] and follow the prompts.  For any non-urgent questions, you may also contact your provider using MyChart. We now offer e-Visits for anyone 74 and older to request care online for non-urgent symptoms. For details visit [email or web address].   Also download the MyChart app! Go to the app store, search "MyChart", open the app, select [facility], and log in with your MyChart username and password.

## 2022-10-19 ENCOUNTER — Ambulatory Visit: Payer: Self-pay | Admitting: Radiation Oncology

## 2022-10-22 ENCOUNTER — Inpatient Hospital Stay: Payer: Medicare Other

## 2022-10-22 ENCOUNTER — Inpatient Hospital Stay (HOSPITAL_BASED_OUTPATIENT_CLINIC_OR_DEPARTMENT_OTHER): Payer: Medicare Other | Admitting: Hematology

## 2022-10-22 ENCOUNTER — Encounter: Payer: Self-pay | Admitting: Hematology

## 2022-10-22 DIAGNOSIS — Z95828 Presence of other vascular implants and grafts: Secondary | ICD-10-CM | POA: Diagnosis not present

## 2022-10-22 DIAGNOSIS — C7951 Secondary malignant neoplasm of bone: Secondary | ICD-10-CM | POA: Diagnosis not present

## 2022-10-22 DIAGNOSIS — Z79899 Other long term (current) drug therapy: Secondary | ICD-10-CM

## 2022-10-22 DIAGNOSIS — C50912 Malignant neoplasm of unspecified site of left female breast: Secondary | ICD-10-CM

## 2022-10-22 DIAGNOSIS — Z5111 Encounter for antineoplastic chemotherapy: Secondary | ICD-10-CM | POA: Diagnosis not present

## 2022-10-22 DIAGNOSIS — Z171 Estrogen receptor negative status [ER-]: Secondary | ICD-10-CM | POA: Diagnosis not present

## 2022-10-22 DIAGNOSIS — G629 Polyneuropathy, unspecified: Secondary | ICD-10-CM | POA: Diagnosis not present

## 2022-10-22 DIAGNOSIS — C7931 Secondary malignant neoplasm of brain: Secondary | ICD-10-CM | POA: Diagnosis not present

## 2022-10-22 LAB — COMPREHENSIVE METABOLIC PANEL
ALT: 14 U/L (ref 0–44)
AST: 19 U/L (ref 15–41)
Albumin: 3.2 g/dL — ABNORMAL LOW (ref 3.5–5.0)
Alkaline Phosphatase: 94 U/L (ref 38–126)
Anion gap: 6 (ref 5–15)
BUN: 16 mg/dL (ref 8–23)
CO2: 25 mmol/L (ref 22–32)
Calcium: 8.4 mg/dL — ABNORMAL LOW (ref 8.9–10.3)
Chloride: 104 mmol/L (ref 98–111)
Creatinine, Ser: 0.58 mg/dL (ref 0.44–1.00)
GFR, Estimated: >60 mL/min (ref >60)
Glucose, Bld: 93 mg/dL (ref 70–99)
Potassium: 3.8 mmol/L (ref 3.5–5.1)
Sodium: 135 mmol/L (ref 135–145)
Total Bilirubin: 0.2 mg/dL — ABNORMAL LOW (ref 0.3–1.2)
Total Protein: 6.7 g/dL (ref 6.5–8.1)

## 2022-10-22 LAB — CBC WITH DIFFERENTIAL/PLATELET
Abs Immature Granulocytes: 0 10*3/uL (ref 0.00–0.07)
Basophils Absolute: 0 10*3/uL (ref 0.0–0.1)
Basophils Relative: 0 %
Eosinophils Absolute: 0.1 10*3/uL (ref 0.0–0.5)
Eosinophils Relative: 4 %
HCT: 29.1 % — ABNORMAL LOW (ref 36.0–46.0)
Hemoglobin: 9.3 g/dL — ABNORMAL LOW (ref 12.0–15.0)
Immature Granulocytes: 0 %
Lymphocytes Relative: 35 %
Lymphs Abs: 0.9 10*3/uL (ref 0.7–4.0)
MCH: 33.2 pg (ref 26.0–34.0)
MCHC: 32 g/dL (ref 30.0–36.0)
MCV: 103.9 fL — ABNORMAL HIGH (ref 80.0–100.0)
Monocytes Absolute: 0.3 10*3/uL (ref 0.1–1.0)
Monocytes Relative: 11 %
Neutro Abs: 1.3 10*3/uL — ABNORMAL LOW (ref 1.7–7.7)
Neutrophils Relative %: 50 %
Platelets: 160 10*3/uL (ref 150–400)
RBC: 2.8 MIL/uL — ABNORMAL LOW (ref 3.87–5.11)
RDW: 13.6 % (ref 11.5–15.5)
WBC: 2.6 10*3/uL — ABNORMAL LOW (ref 4.0–10.5)
nRBC: 0 % (ref 0.0–0.2)

## 2022-10-22 LAB — MAGNESIUM: Magnesium: 1.8 mg/dL (ref 1.7–2.4)

## 2022-10-22 MED ORDER — HEPARIN SOD (PORK) LOCK FLUSH 100 UNIT/ML IV SOLN
500.0000 [IU] | Freq: Once | INTRAVENOUS | Status: AC
  Administered 2022-10-22: 500 [IU] via INTRAVENOUS

## 2022-10-22 MED ORDER — SODIUM CHLORIDE 0.9% FLUSH
10.0000 mL | INTRAVENOUS | Status: DC | PRN
  Administered 2022-10-22: 10 mL via INTRAVENOUS

## 2022-10-22 MED ORDER — SODIUM CHLORIDE 0.9% FLUSH
10.0000 mL | Freq: Once | INTRAVENOUS | Status: AC
  Administered 2022-10-22: 10 mL via INTRAVENOUS

## 2022-10-22 NOTE — Progress Notes (Signed)
Patient seen today in office visit, labs reviewed by MD and per MD no need for IVF today.

## 2022-10-22 NOTE — Progress Notes (Signed)
ON PATHWAY REGIMEN - Breast  No Change  Continue With Treatment as Ordered.  Original Decision Date/Time: 06/08/2022 18:47     A cycle is every 21 days:     Sacituzumab govitecan-hziy   **Always confirm dose/schedule in your pharmacy ordering system**  Patient Characteristics: Distant Metastases or Locoregional Recurrent Disease - Unresected or Locally Advanced Unresectable Disease Progressing after Neoadjuvant and Local Therapies, ER Negative/Unknown, Chemotherapy, HER2 Negative/Unknown, Second Line Therapeutic Status: Distant Metastases HER2 Status: Negative (-) ER Status: Negative (-) PR Status: Negative (-) Therapy Approach Indicated: Standard Chemotherapy/Endocrine Therapy Line of Therapy: Second Line Intent of Therapy: Non-Curative / Palliative Intent, Discussed with Patient

## 2022-10-22 NOTE — Progress Notes (Signed)
START ON PATHWAY REGIMEN - Breast     A cycle is every 21 days:     Fam-trastuzumab deruxtecan-nxki   **Always confirm dose/schedule in your pharmacy ordering system**  Patient Characteristics: Distant Metastases or Locoregional Recurrent Disease - Unresected or Locally Advanced Unresectable Disease Progressing after Neoadjuvant and Local Therapies, HER2 Low/Negative, ER Negative, Chemotherapy, HER2 Low, Third Line and Beyond, [employer] or  Not a Candidate for Molecular Targeted Therapy Therapeutic Status: Distant Metastases HER2 Status: Low ER Status: Negative (-) PR Status: Negative (-) Therapy Approach Indicated: Standard Chemotherapy/Endocrine Therapy Line of Therapy: Third Line and Beyond Intent of Therapy: Non-Curative / Palliative Intent, Discussed with Patient

## 2022-10-22 NOTE — Patient Instructions (Addendum)
[facility] Discharge Instructions   You were seen and examined today by Dr. Delton Coombes.  He reviewed the results of your lab work today which are normal.   He discussed with you changing your treatment to a drug called Enhertu since the Ivette Loyal is causing you diarrhea that is hard to control.   We will need to obtain an echocardiogram of your heart as this medication can effect the ability of your heart to pump appropriately. We will check these periodically throughout treatment.   Return as scheduled.     Thank you for choosing [facility] [place] at [facility] to provide your oncology and hematology care.  To afford each patient quality time with our provider, please arrive at least 15 minutes before your scheduled appointment time.   If you have a lab appointment with the [facility] please come in thru the Main Entrance and check in at the main information desk.  You need to re-schedule your appointment should you arrive 10 or more minutes late.  We strive to give you quality time with our providers, and arriving late affects you and other patients whose appointments are after yours.  Also, if you no show three or more times for appointments you may be dismissed from the clinic at the providers discretion.     Again, thank you for choosing [facility].  Our hope is that these requests will decrease the amount of time that you wait before being seen by our physicians.       _____________________________________________________________  Should you have questions after your visit to [facility], please contact our office at [phone] and follow the prompts.  Our office hours are 8:00 a.m. and 4:30 p.m. Monday - Friday.  Please note that voicemails left after 4:00 p.m. may not be returned until the following business day.  We are closed weekends and major holidays.  You do have access to a nurse 24-7, just  call the main number to the clinic [phone] and do not press any options, hold on the line and a nurse will answer the phone.    For prescription refill requests, have your pharmacy contact our office and allow 72 hours.    Due to Covid, you will need to wear a mask upon entering the hospital. If you do not have a mask, a mask will be given to you at the Main Entrance upon arrival. For doctor visits, patients may have 1 support person age 74 or older with them. For treatment visits, patients can not have anyone with them due to social distancing guidelines and our immunocompromised population.

## 2022-10-22 NOTE — Progress Notes (Signed)
Patients port flushed without difficulty.  Good blood return noted with no bruising or swelling noted at site.  Stable during access and blood draw.  Patient to remain accessed for possible treatment.

## 2022-10-22 NOTE — Progress Notes (Signed)
DISCONTINUE ON PATHWAY REGIMEN - Breast     A cycle is every 21 days:     Sacituzumab govitecan-hziy   **Always confirm dose/schedule in your pharmacy ordering system**  REASON: Other Reason PRIOR TREATMENT: [id number]: Sacituzumab Govitecan 10 mg/kg D1, 8 q21 Days TREATMENT RESPONSE: Unable to Evaluate    Patient Characteristics: Distant Metastases or Locoregional Recurrent Disease - Unresected or Locally Advanced Unresectable Disease Progressing after Neoadjuvant and Local Therapies, HER2 Low/Negative, ER Negative, Chemotherapy, HER2 Low, Third Line and Beyond, [employer] or  Not a Candidate for Molecular Targeted Therapy Therapeutic Status: Distant Metastases HER2 Status: Low ER Status: Negative (-) PR Status: Negative (-) Therapy Approach Indicated: Standard Chemotherapy/Endocrine Therapy Line of Therapy: Third [employer]

## 2022-10-22 NOTE — Progress Notes (Signed)
[place] [place], [place] [address]   CLINIC:  Medical Oncology/Hematology  PCP:  Neale Burly, MD [place] / [place] [id number] [phone]   REASON FOR VISIT:  Metastatic triple negative breast cancer to the bones.  PRIOR THERAPY: 1.  Neoadjuvant chemotherapy with weekly carboplatin and paclitaxel and dose dense AC with pembrolizumab 2. Left mastectomy and lymph node excision by Dr. Ninfa Linden on 11/20/2021 3.  Sacituzumab on 08/18/2022 and 10/08/2021, discontinued due to poor tolerance.  NGS Results: Requested and pending  CURRENT THERAPY: Enhertu every 21 days.  BRIEF ONCOLOGIC HISTORY:  Oncology History  Invasive lobular carcinoma of left breast in female [facility])  01/23/2021 Initial Diagnosis   Invasive lobular carcinoma of left breast in female [facility])   02/19/2021 Genetic Testing   Negative genetic testing on the CancerNext-Expanded+RNAinsight panel.  SMARCB1 VUS identified.  The CancerNext-Expanded gene panel offered by [facility] and includes sequencing and rearrangement analysis for the following 77 genes: AIP, ALK, APC*, ATM*, AXIN2, BAP1, BARD1, BLM, BMPR1A, BRCA1*, BRCA2*, BRIP1*, CDC73, CDH1*, CDK4, CDKN1B, CDKN2A, CHEK2*, CTNNA1, DICER1, FANCC, FH, FLCN, GALNT12, KIF1B, LZTR1, MAX, MEN1, MET, MLH1*, MSH2*, MSH3, MSH6*, MUTYH*, NBN, NF1*, NF2, NTHL1, PALB2*, PHOX2B, PMS2*, POT1, PRKAR1A, PTCH1, PTEN*, RAD51C*, RAD51D*, RB1, RECQL, RET, SDHA, SDHAF2, SDHB, SDHC, SDHD, SMAD4, SMARCA4, SMARCB1, SMARCE1, STK11, SUFU, TMEM127, TP53*, TSC1, TSC2, VHL and XRCC2 (sequencing and deletion/duplication); EGFR, EGLN1, HOXB13, KIT, MITF, PDGFRA, POLD1, and POLE (sequencing only); EPCAM and GREM1 (deletion/duplication only). DNA and RNA analyses performed for * genes. The report date is February 19, 2021.   02/27/2021 - 05/11/2022 Chemotherapy   Patient is on Treatment Plan : BREAST Pembrolizumab + Carboplatin D1,8,15+ Paclitaxel D1,8,15 q21d X 4 cycles  / Pembrolizumab + AC q21d x 4 cycles     12/22/2021 Cancer Staging   Staging form: Breast, AJCC 8th Edition - Pathologic stage from 12/22/2021: No Stage Recommended (ypT3, pN3a, cM0, G2, ER-, PR-, HER2-) - Signed by Derek Jack, MD on 12/22/2021 Histopathologic type: Lobular carcinoma, NOS Stage prefix: Post-therapy Method of lymph node assessment: Axillary lymph node dissection Multigene prognostic tests performed: None Histologic grading system: 3 grade system   08/18/2022 -  Chemotherapy   Patient is on Treatment Plan : BREAST METASTATIC Sacituzumab govitecan-hziy Ivette Loyal) D1,8 q21d       CANCER STAGING:  Cancer Staging  Invasive lobular carcinoma of left breast in female [facility]) Staging form: Breast, AJCC 8th Edition - Clinical stage from 01/23/2021: cT3, cN3c, G2, ER-, PR-, HER2- - Unsigned - Pathologic stage from 12/22/2021: No Stage Recommended (ypT3, pN3a, cM0, G2, ER-, PR-, HER2-) - Signed by Derek Jack, MD on 12/22/2021   INTERVAL HISTORY:  Denise [name], a 72 y.o. female, seen for follow-up of metastatic triple negative breast cancer to the bones. She was last seen by me on 10/15/22.  Today, she states that she is doing well overall. Her appetite level is at 100%. Her energy level is at 90%. She reports having diarrhea for the course of 4 days with x3 episodes per day. Her diarrhea stopped with the help of Imodium x4 days ago. She reports having dry skin. She denies any leg swelling, decreased appetite, or night sweats.  She is scheduled to have a brain MRI on 10/23/2022. Her last echocardiogram was  02/21/2021 and showed a LVEF of 55-60%. Her last PET scan was on 05/28/2022. She had a CT Abdomen Pelvis with contrast on 12/11/20223.   REVIEW OF  SYSTEMS:  Review of Systems  Constitutional:  Negative for chills, fatigue and fever.  HENT:   Positive for trouble swallowing (solids). Negative for lump/mass, mouth sores, nosebleeds and sore throat.   Eyes:   Negative for eye problems.  Respiratory:  Negative for cough.   Cardiovascular:  Negative for chest pain, leg swelling and palpitations.  Gastrointestinal:  Positive for diarrhea (improved). Negative for abdominal pain, constipation, nausea and vomiting.  Genitourinary:  Negative for bladder incontinence, difficulty urinating, dysuria, frequency, hematuria and nocturia.   Musculoskeletal:  Negative for arthralgias, back pain, flank pain, myalgias and neck pain.  Skin:  Negative for itching and rash.       + dry skin  Neurological:  Positive for dizziness and headaches. Negative for numbness.  Hematological:  Does not bruise/bleed easily.  Psychiatric/Behavioral:  Positive for sleep disturbance. Negative for depression and suicidal ideas. The patient is not nervous/anxious.   All other systems reviewed and are negative.   PAST MEDICAL/SURGICAL HISTORY:  Past Medical History:  Diagnosis Date   Asthma    as child   Cancer ([place]) 12/2020   left breast IMC   Complication of anesthesia    pateitn states,' I coded when I had my D&Cmany years ago.   Dyspnea    Family history of breast cancer    Hypertension    Hypothyroidism    PONV (postoperative nausea and vomiting)    Port-A-Cath in place 02/26/2021   Past Surgical History:  Procedure Laterality Date   ABDOMINAL HYSTERECTOMY     BIOPSY  01/17/2018   Procedure: BIOPSY;  Surgeon: Rogene Houston, MD;  [facility];  Service: Endoscopy;;  duodenum,gastric   CHOLECYSTECTOMY     COLONOSCOPY WITH PROPOFOL N/A 01/17/2018   Procedure: COLONOSCOPY WITH PROPOFOL;  Surgeon: Rogene Houston, MD;  [facility];  Service: Endoscopy;  Laterality: N/A;  7:30   DILATION AND CURETTAGE OF UTERUS     ESOPHAGOGASTRODUODENOSCOPY (EGD) WITH PROPOFOL N/A 01/17/2018   Procedure: ESOPHAGOGASTRODUODENOSCOPY (EGD) WITH PROPOFOL;  Surgeon: Rogene Houston, MD;  [facility];  Service: Endoscopy;  Laterality: N/A;   POLYPECTOMY   01/17/2018   Procedure: POLYPECTOMY;  Surgeon: Rogene Houston, MD;  [facility];  Service: Endoscopy;;  transverse colon, cecal   PORTACATH PLACEMENT Right 02/17/2021   Procedure: INSERTION PORT-A-CATH;  Surgeon: Coralie Keens, MD;  Location: [place];  Service: General;  Laterality: Right;   RADIOACTIVE SEED GUIDED AXILLARY SENTINEL LYMPH NODE Left 11/20/2021   Procedure: RADIOACTIVE SEED GUIDED LEFT AXILLARY SENTINEL LYMPH NODE DISSECTION;  Surgeon: Coralie Keens, MD;  Location: [place];  Service: General;  Laterality: Left;   TOTAL MASTECTOMY Left 11/20/2021   Procedure: LEFT TOTAL MASTECTOMY;  Surgeon: Coralie Keens, MD;  Location: [place];  Service: General;  Laterality: Left;    SOCIAL HISTORY:  Social History   Socioeconomic History   Marital status: Widowed    Spouse name: Not on file   Number of children: 3   Years of education: Not on file   Highest education level: Not on file  Occupational History   Occupation: [employer]   Occupation: retired  Tobacco Use   Smoking status: Never   Smokeless tobacco: Never  Vaping Use   Vaping Use: Never used  Substance and Sexual Activity   Alcohol use: Never   Drug use: Never   Sexual activity: Not Currently    Birth control/protection:  Surgical  Other Topics Concern   Not on file  Social History Narrative   Not on file   Social Determinants of Health   Financial Resource Strain: Low Risk  (07/10/2022)   Overall Financial Resource Strain (CARDIA)    Difficulty of Paying Living Expenses: Not hard at all  Food Insecurity: No Food Insecurity (06/30/2022)   Hunger Vital Sign    Worried About Running Out of Food in the Last Year: Never true    Ran Out of Food in the Last Year: Never true  Transportation Needs: No Transportation Needs (07/10/2022)   PRAPARE - Hydrologist (Medical): No    Lack of Transportation  (Non-Medical): No  Physical Activity: Insufficiently Active (01/22/2021)   Exercise Vital Sign    Days of Exercise per Week: 3 days    Minutes of Exercise per Session: 30 min  Stress: No Stress Concern Present (01/22/2021)   [place]    Feeling of Stress : Not at all  Social Connections: Moderately Integrated (01/22/2021)   Social Connection and Isolation Panel [NHANES]    Frequency of Communication with Friends and Family: More than three times a week    Frequency of Social Gatherings with Friends and Family: More than three times a week    Attends Religious Services: More than 4 times per year    Active Member of [employer] or Organizations: No    Attends Music therapist: More than 4 times per year    Marital Status: Widowed  Intimate Partner Violence: Not At Risk (05/28/2022)   Humiliation, Afraid, Rape, and Kick questionnaire    Fear of Current or Ex-Partner: No    Emotionally Abused: No    Physically Abused: No    Sexually Abused: No    FAMILY HISTORY:  Family History  Problem Relation Age of Onset   Breast cancer Mother        dx in her 63s   Heart disease Mother    Thyroid disease Mother    Lung cancer Father        dx in his 20s   Thyroid disease Maternal Aunt    Thyroid nodules Maternal Grandmother 35       goiter   Thyroid disease Maternal Grandmother    Heart disease Maternal Grandfather    Heart disease Paternal Grandmother    Heart disease Paternal Grandfather    Thyroid disease Daughter    Thyroid disease Daughter    Cancer Maternal Uncle        NOS   Cancer Paternal Uncle        NOS   Breast cancer Cousin        pat first cousin died in her 53s;     CURRENT MEDICATIONS:  Current Outpatient Medications  Medication Sig Dispense Refill   Acetaminophen (TYLENOL PO) Take 1 tablet by mouth as needed.     gabapentin (NEURONTIN) 300 MG capsule Take 1 capsule by mouth at bedtime.      hydrALAZINE (APRESOLINE) 25 MG tablet Take 25 mg by mouth 3 (three) times daily.     levothyroxine (SYNTHROID) 75 MCG tablet Take 100 mcg by mouth daily before breakfast.     Melatonin 5 MG TABS Take 10 mg by mouth. As needed for sleep     olmesartan-hydrochlorothiazide (BENICAR HCT) 40-25 MG tablet Take 1 tablet by mouth daily.     pantoprazole (PROTONIX) 40 MG tablet Take 40  mg by mouth daily.     predniSONE (DELTASONE) 50 MG tablet Take one tab po 13 hours prior, then one tab 7 hours prior, and then one tab 1 hour prior to MRI 3 tablet 0   promethazine (PHENERGAN) 25 MG tablet Take by mouth.     No current facility-administered medications for this visit.    ALLERGIES:  Allergies  Allergen Reactions   Exforge [Amlodipine Besylate-Valsartan] Nausea And Vomiting   Gadolinium Derivatives Hives, Itching and Anaphylaxis    13hr prep prior to contrast admin  MRI iv contrast   Gadopiclenol Hives and Itching    13 hr prep prior to contrast admin    Tape Other (See Comments)    Redness and Irriatation   Cheese     Hard cheese   Levofloxacin In D5w Hives   Other    Proanthocyanidin    Strawberry Extract Diarrhea    Seeds,nuts, lettuce, grapes   Wild Lettuce Extract (Lactuca Virosa)    Yeast-Related Products Hives    Mold on bread    PHYSICAL EXAM:  Performance status (ECOG): 1 - Symptomatic but completely ambulatory  There were no vitals filed for this visit.   Wt Readings from Last 3 Encounters:  10/22/22 73.9 kg (163 lb)  10/15/22 71.9 kg (158 lb 8 oz)  10/08/22 72.8 kg (160 lb 9.6 oz)   Physical Exam Vitals reviewed. Exam conducted with a chaperone present.  Constitutional:      Appearance: Normal appearance.  Cardiovascular:     Rate and Rhythm: Normal rate and regular rhythm.     Pulses: Normal pulses.     Heart sounds: Normal heart sounds.  Pulmonary:     Effort: Pulmonary effort is normal.     Breath sounds: Normal breath sounds.  Abdominal:     Palpations:  Abdomen is soft. There is no hepatomegaly, splenomegaly or mass.     Tenderness: There is no abdominal tenderness.  Lymphadenopathy:     Upper Body:     Right upper body: No supraclavicular, axillary or pectoral adenopathy.     Left upper body: No supraclavicular, axillary or pectoral adenopathy.  Neurological:     General: No focal deficit present.     Mental Status: She is alert and oriented to person, place, and time.  Psychiatric:        Mood and Affect: Mood normal.        Behavior: Behavior normal.     LABORATORY DATA:  I have reviewed the labs as listed.     Latest Ref Rng & Units 10/15/2022   11:08 AM 10/08/2022   12:00 PM 09/28/2022   11:50 AM  CBC  WBC 4.0 - 10.5 K/uL 3.1  4.6  5.3   Hemoglobin 12.0 - 15.0 g/dL 9.9  10.8  9.8   Hematocrit 36.0 - 46.0 % 30.4  33.2  31.0   Platelets 150 - 400 K/uL 190  237  259       Latest Ref Rng & Units 10/15/2022   11:08 AM 10/08/2022   12:00 PM 09/28/2022   11:50 AM  CMP  Glucose 70 - 99 mg/dL 95  128  95   BUN 8 - 23 mg/dL '17  25  19   '$ Creatinine 0.44 - 1.00 mg/dL 0.66  0.85  0.73   Sodium 135 - 145 mmol/L 133  132  135   Potassium 3.5 - 5.1 mmol/L 4.1  3.6  3.8   Chloride 98 - 111 mmol/L 103  100  103   CO2 22 - 32 mmol/L '22  23  23   '$ Calcium 8.9 - 10.3 mg/dL 8.7  9.0  8.6   Total Protein 6.5 - 8.1 g/dL 7.0  7.8  7.2   Total Bilirubin 0.3 - 1.2 mg/dL 0.4  0.5  0.2   Alkaline Phos 38 - 126 U/L 89  101  98   AST 15 - 41 U/L '18  26  23   '$ ALT 0 - 44 U/L '15  16  14     '$ DIAGNOSTIC IMAGING:  I have independently reviewed the scans and discussed with the patient. No results found.   ASSESSMENT:  1.  T3N3c (stage IIIc) triple negative invasive lobular carcinoma of the left breast: - She felt lump in her left breast for more than 6 months, but thought it was secondary to fibrocystic disease which she had all her life.  When she started having pain, she reached out to Dr. Sherrie Sport. - Ultrasound-guided left breast and left axillary  lymph node biopsy on 01/15/2021 - Pathology consistent with invasive lobular carcinoma, E-cadherin negative.  ER/PR/HER2 negative.  HER2 2+ by IHC, negative by FISH.  Ki-67 is 5%.  Lymph node core biopsy was consistent with metastatic carcinoma.  Grade 2. - PET scan on 02/03/2021 showed involvement of left supraclavicular, subpectoral, axillary lymph nodes along with the breast mass.  10 mm left lung nodule which is hypometabolic.  Spinal cord lesion at T12 level with a strong uptake. - MRI of the lumbar spine with and without contrast on 02/20/2021 showed no mass or abnormal enhancement within the canal at the T12 level to correspond to the site of PET scan positive.  No marrow replacing bone lesion. - 12 weeks of carboplatin and paclitaxel weekly and every 3 weeks pembrolizumab followed by 4 cycles of dose dense AC with pembrolizumab ([id number]) from 02/27/2021 through 10/01/2021 - PET scan on 10/16/2021: Reduction in metabolic activity in the size of the left axillary lymph node and no evidence of breast hypermetabolism on PET scan. - Left mastectomy and lymph node excision by Dr. Ninfa Linden on 11/20/2021 - Pathology: 6.2 cm invasive lobular carcinoma, grade 2, margins negative.  19/21 lymph nodes involved with macrometastasis.  ypT3, YPN3A - Adjuvant pembrolizumab started on 01/01/2022. -CREATE-X capecitabine 1500 mg twice daily 2 weeks on/1 week off started on 01/01/2022.  Last dose of pembrolizumab on 05/11/2022 - Germline mutation testing ([facility]) negative - [employer] (06/04/2022): T p53, K-ras V14 I, CDKN2A.  MSI-high not detected. - PET scan (05/28/2022): Multifocal bone lesions involving T11 vertebral body, right posterior sixth rib, left femoral neck, left scapular glenoid.  No other metastatic disease. - Sacituzumab cycle 1 on 08/18/2022.  She was hospitalized with neutropenic fever and severe diarrhea from 08/24/2022 through 08/27/2022. - CTAP (10/25/2021): Generalized mild to moderate wall  thickening throughout the nondistended large bowel with faint pericolonic fat haziness compatible with nonspecific infectious/inflammatory colitis.  No free air.  C. difficile x 2 was negative. - She has UG T1 A1*28 heterozygosity. - Cycle 2 Sacituzumab dose reduced by 50%.     2.  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  She is non-smoker. - Mother died of breast cancer.  Maternal grandmother died very young in her 42s, sister has fibrocystic disease.  Father died of lung cancer and was a smoker.   PLAN:  1.  Metastatic TNBC to the bones: - She had developed a severe  diarrhea after cycle 2 of Sacituzumab which was 50% dose reduced.  She had diarrhea for 4 to 5 days, multiple times a day.  On the fifth day she had diarrhea for once and stopped now.  She is constipated at this time. - Reviewed labs today which showed normal LFTs and creatinine.  CBC shows decreased white count 2.6 and neutrophil count 1.3. - She has very poor tolerance of Sacituzumab with neutropenia and diarrhea despite 50% dose reduction. - Hence I have recommended discontinuation of Sacituzumab.  She is considered HER2 low with HER2 IHC 2+.  Next best option would be Enhertu.  We discussed side effects including 1% risk of CHF, rare chance of interstitial lung disease.  She reports that she has farmers lung as [place]. - I have recommended baseline CT CAP with contrast.  Will also obtain 2D echocardiogram. - She will come back in 2 weeks to start Enhertu.   2.  Brain lesions (SRS to 3 brain lesions on 06/16/2022): - Last brain MRI on 07/15/2022: Stable to slight interval decrease in size of intracranial metastatic disease with no new lesions. - She is scheduled for another brain MRI tomorrow.  Will follow-up on it.  3.  Peripheral neuropathy: - Neuropathy in the toes has been stable.  4.  Hypomagnesemia: - Magnesium is 1.8 today.  She is unable to tolerate  magnesium due to diarrhea.    Orders placed this encounter:  No orders of the defined types were placed in this encounter.   I,Alexis Herring,acting as a Education administrator for [employer], MD.,have documented all relevant documentation on the behalf of Derek Jack, MD,as directed by  Derek Jack, MD while in the presence of Derek Jack, MD.  I, Derek Jack MD, have reviewed the above documentation for accuracy and completeness, and I agree with the above.    Derek Jack, MD [place] [phone]

## 2022-10-23 ENCOUNTER — Other Ambulatory Visit: Payer: Self-pay

## 2022-10-23 ENCOUNTER — Ambulatory Visit
Admission: RE | Admit: 2022-10-23 | Discharge: 2022-10-23 | Disposition: A | Discharge status: Home / Self Care | Payer: Medicare Other | Source: Ambulatory Visit | Attending: Radiation Oncology | Admitting: Radiation Oncology

## 2022-10-23 DIAGNOSIS — C7931 Secondary malignant neoplasm of brain: Secondary | ICD-10-CM

## 2022-10-23 DIAGNOSIS — G939 Disorder of brain, unspecified: Secondary | ICD-10-CM | POA: Diagnosis not present

## 2022-10-23 DIAGNOSIS — C7951 Secondary malignant neoplasm of bone: Secondary | ICD-10-CM | POA: Diagnosis not present

## 2022-10-23 DIAGNOSIS — Z87828 Personal history of other (healed) physical injury and trauma: Secondary | ICD-10-CM | POA: Diagnosis not present

## 2022-10-23 MED ORDER — SODIUM CHLORIDE 0.9% FLUSH
10.0000 mL | INTRAVENOUS | Status: DC | PRN
  Administered 2022-10-23: 10 mL via INTRAVENOUS

## 2022-10-23 MED ORDER — GADOBENATE DIMEGLUMINE 529 MG/ML IV SOLN
15.0000 mL | Freq: Once | INTRAVENOUS | Status: AC | PRN
  Administered 2022-10-23: 15 mL via INTRAVENOUS

## 2022-10-23 MED ORDER — HEPARIN SOD (PORK) LOCK FLUSH 100 UNIT/ML IV SOLN: 500.0000 [IU] | Freq: Once | INTRAVENOUS | Status: DC

## 2022-10-23 NOTE — Procedures (Signed)
*  pt took a 13 hr prep for contrast allergy/ multihance was used today instead of vueway/ pt states it was only the vueway she was allergic to

## 2022-10-24 ENCOUNTER — Other Ambulatory Visit: Payer: Self-pay

## 2022-10-26 ENCOUNTER — Ambulatory Visit
Admission: RE | Admit: 2022-10-26 | Discharge: 2022-10-26 | Disposition: A | Discharge status: Home / Self Care | Payer: Medicare Other | Source: Ambulatory Visit | Attending: Radiation Oncology | Admitting: Radiation Oncology

## 2022-10-26 ENCOUNTER — Inpatient Hospital Stay: Payer: Medicare Other

## 2022-10-26 ENCOUNTER — Encounter: Payer: Self-pay | Admitting: Radiation Oncology

## 2022-10-26 DIAGNOSIS — C7931 Secondary malignant neoplasm of brain: Secondary | ICD-10-CM

## 2022-10-26 DIAGNOSIS — C50912 Malignant neoplasm of unspecified site of left female breast: Secondary | ICD-10-CM

## 2022-10-26 DIAGNOSIS — C7951 Secondary malignant neoplasm of bone: Secondary | ICD-10-CM | POA: Diagnosis not present

## 2022-10-26 NOTE — Progress Notes (Signed)
Telephone nursing appointment for patient to review MRI results from 10/23/22. I verified patient's identity and began nursing interview. Patient reports occasional headaches 5/10. No other issues reported at this time.   Meaningful use complete.   Patient aware of their 2:00pm-10/26/2022 telephone appointment w/ Shona Simpson PA-C. I left my extension [phone] in case patient needs anything. Patient verbalized understanding. This concludes the nursing interview.   Patient contact [phone]     Leandra Kern, LPN

## 2022-10-26 NOTE — Progress Notes (Signed)
Radiation Oncology         [phone] ________________________________  Outpatient Follow Up- Conducted via telephone at patient request.  I spoke with the patient to conduct this visit via telephone. The patient was notified in advance and was offered an in person or telemedicine meeting to allow for face to face communication but instead preferred to proceed with a telephone visit.  Name: Denise [name]        MRN: [id number]  Date of Service: 10/26/2022 DOB: 1951/07/18  [id number], [name], MD  Neale Burly, MD     REFERRING PHYSICIAN: Neale Burly, MD   DIAGNOSIS: There were no encounter diagnoses.   HISTORY OF PRESENT ILLNESS: Denise [name] is a 72 y.o. female seen at the request of Dr. Delton Coombes for a new diagnosis of metastatic brain disease from her history of breast cancer.  The patient has a history of stage IIIC, cT3N3c triple negative invasive lobular carcinoma of the left breast for which she underwent neoadjuvant chemo and immunotherapy beginning in June 2022 through January 2023.  She remained on Keytruda with her last dose and consolidation being on 05/11/2022.    She was found to have progressive disease in the brain and multiple bones. She received single fraction SRS to three lesions on 06/16/22, but during her 3T MRI was found to have a punctate area of enhancement in the right lentiform nucleas. It was recommended she have a repeat MRI 1 month later. She also went on to receive a palliative course of radiation to the left chest wall along the scapula, T11, and proximal femur. She completed the palliative radiation as well without difficulty.   She has been followed with MRI imaging for surveillance of brain disease. She is also working with Dr. Delton Coombes and has plans to start Enhertu. She had an MRI of the brain on 10/23/22 showed stable to slight increase in the two right frontoparietal lesions, and decreased visibility (per radiology in brain oncology  conference no longer visible) in the cerebellar vermis. She's contacted by phone to review these results.   PREVIOUS RADIATION THERAPY:    06/18/22-07/09/22: The left chest including scapula, T11 spine, and proximal left femur were treated to 37.5 Gy in 15 fractions  06/16/22:   SRS Treatment: The following sites were treated to 20 Gy in 1 fraction PTV_1_FrontR_33m PTV_2_FrontR_534m PTV_3_Vermis_2m35m PAST MEDICAL HISTORY:  Past Medical History:  Diagnosis Date   Asthma    as child   [facility] [place]   left breast I[facility]   Complication of anesthesia    pateitn states,' I coded when I had my D&Cmany years ago.   Dyspnea    Family history of breast cancer    Hypertension    Hypothyroidism    PONV (postoperative nausea and vomiting)    Port-A-Cath in place 02/26/2021       PAST SURGICAL HISTORY: Past Surgical History:  Procedure Laterality Date   ABDOMINAL HYSTERECTOMY     BIOPSY  01/17/2018   Procedure: BIOPSY;  Surgeon: RehRogene HoustonD;  [facility];  Service: Endoscopy;;  duodenum,gastric   CHOLECYSTECTOMY     COLONOSCOPY WITH PROPOFOL N/A 01/17/2018   Procedure: COLONOSCOPY WITH PROPOFOL;  Surgeon: RehRogene HoustonD;  [facility];  Service: Endoscopy;  Laterality: N/A;  7:30   DILATION AND CURETTAGE OF UTERUS     ESOPHAGOGASTRODUODENOSCOPY (EGD) WITH PROPOFOL N/A 01/17/2018   Procedure: ESOPHAGOGASTRODUODENOSCOPY (EGD) WITH PROPOFOL;  Surgeon:  Rogene Houston, MD;  [facility];  Service: Endoscopy;  Laterality: N/A;   POLYPECTOMY  01/17/2018   Procedure: POLYPECTOMY;  Surgeon: Rogene Houston, MD;  [facility];  Service: Endoscopy;;  transverse colon, cecal   PORTACATH PLACEMENT Right 02/17/2021   Procedure: INSERTION PORT-A-CATH;  Surgeon: Coralie Keens, MD;  Location: [place];  Service: General;  Laterality: Right;   RADIOACTIVE SEED GUIDED AXILLARY SENTINEL LYMPH NODE Left 11/20/2021    Procedure: RADIOACTIVE SEED GUIDED LEFT AXILLARY SENTINEL LYMPH NODE DISSECTION;  Surgeon: Coralie Keens, MD;  Location: [place];  Service: General;  Laterality: Left;   TOTAL MASTECTOMY Left 11/20/2021   Procedure: LEFT TOTAL MASTECTOMY;  Surgeon: Coralie Keens, MD;  Location: [place];  Service: General;  Laterality: Left;     FAMILY HISTORY:  Family History  Problem Relation Age of Onset   Breast cancer Mother        dx in her 12s   Heart disease Mother    Thyroid disease Mother    Lung cancer Father        dx in his 5s   Thyroid disease Maternal Aunt    Thyroid nodules Maternal Grandmother 35       goiter   Thyroid disease Maternal Grandmother    Heart disease Maternal Grandfather    Heart disease Paternal Grandmother    Heart disease Paternal Grandfather    Thyroid disease Daughter    Thyroid disease Daughter    Cancer Maternal Uncle        NOS   Cancer Paternal Uncle        NOS   Breast cancer Cousin        pat first cousin died in her 27s;      SOCIAL HISTORY:  reports that she has never smoked. She has never used smokeless tobacco. She reports that she does not drink alcohol and does not use drugs.  Patient is widowed and lives in [place]. She has an adult daughter Jacqlyn Larsen who lives in [place], and a son Legrand Como who lives in [place].    ALLERGIES: Exforge [amlodipine besylate-valsartan], Gadolinium derivatives, Gadopiclenol, Tape, Cheese, Levofloxacin in d5w, Other, Proanthocyanidin, Strawberry extract, Wild lettuce extract (lactuca virosa), and Yeast-related products   MEDICATIONS:  Current Outpatient Medications  Medication Sig Dispense Refill   Acetaminophen (TYLENOL PO) Take 1 tablet by mouth as needed.     gabapentin (NEURONTIN) 300 MG capsule Take 1 capsule by mouth at bedtime.     hydrALAZINE (APRESOLINE) 25 MG tablet Take 25 mg by mouth 3 (three) times daily.     levothyroxine (SYNTHROID) 75  MCG tablet Take 100 mcg by mouth daily before breakfast.     Melatonin 5 MG TABS Take 10 mg by mouth. As needed for sleep     olmesartan-hydrochlorothiazide (BENICAR HCT) 40-25 MG tablet Take 1 tablet by mouth daily.     pantoprazole (PROTONIX) 40 MG tablet Take 40 mg by mouth daily.     predniSONE (DELTASONE) 50 MG tablet Take one tab po 13 hours prior, then one tab 7 hours prior, and then one tab 1 hour prior to MRI 3 tablet 0   promethazine (PHENERGAN) 25 MG tablet Take by mouth.     No current facility-administered medications for this encounter.     REVIEW OF SYSTEMS: On review of systems, the patient reports she is hopeful to tolerate enhertu with Dr. Delton Coombes since she did not tolerate  Trodelvy. She reports she's had several weeks of a headache that is aborted by excedrin. She describes pain in the side of her head, posterior to her left ear. She does find that lying down on her right side in her bed is quite helpful and almost abortive completely for headaches. She denies any changes in hearing or speech. No other complaints are verbalized.      PHYSICAL EXAM:  Unable to assess given encounter type    ECOG = 1  0 - Asymptomatic (Fully active, able to carry on all predisease activities without restriction)  1 - Symptomatic but completely ambulatory (Restricted in physically strenuous activity but ambulatory and able to carry out work of a light or sedentary nature. For example, light housework, office work)  2 - Symptomatic, <50% in bed during the day (Ambulatory and capable of all self care but unable to carry out any work activities. Up and about more than 50% of waking hours)  3 - Symptomatic, >50% in bed, but not bedbound (Capable of only limited self-care, confined to bed or chair 50% or more of waking hours)  4 - Bedbound (Completely disabled. Cannot carry on any self-care. Totally confined to bed or chair)  5 - Death   Eustace Pen MM, Creech RH, Tormey DC, et al. [phone]).  "Toxicity and response criteria of the [facility]". [place] Oncol. 5 (6): 649-55    LABORATORY DATA:  Lab Results  Component Value Date   WBC 2.6 (L) 10/22/2022   HGB 9.3 (L) 10/22/2022   HCT 29.1 (L) 10/22/2022   MCV 103.9 (H) 10/22/2022   PLT 160 10/22/2022   Lab Results  Component Value Date   NA 135 10/22/2022   K 3.8 10/22/2022   CL 104 10/22/2022   CO2 25 10/22/2022   Lab Results  Component Value Date   ALT 14 10/22/2022   AST 19 10/22/2022   ALKPHOS 94 10/22/2022   BILITOT 0.2 (L) 10/22/2022      RADIOGRAPHY: MR Brain W Wo Contrast  Result Date: 10/26/2022 CLINICAL DATA:  Brain metastases, assess treatment response 3T SRS Protocol - pt had allergic reaction to Vueway in the past. Will be taking premed for contast EXAM: MRI HEAD WITHOUT AND WITH CONTRAST TECHNIQUE: Multiplanar, multiecho pulse sequences of the brain and surrounding structures were obtained without and with intravenous contrast. CONTRAST:  33m MULTIHANCE GADOBENATE DIMEGLUMINE 529 MG/ML IV SOLN COMPARISON:  MRI head November 1, 23. FINDINGS: Brain: Stable to slight interval decrease in the size of intracranial metastasis in the right frontoparietal region. Decreased conspicuity of punctate lesion in the cerebellar vermis. No new lesions identified. No evidence of acute infarct, acute hemorrhage, hydrocephalus, or midline shift. Vascular: Major arterial flow voids are maintained at the skull base. Skull and upper cervical spine: Multifocal osseous enhancing lesions, compatible with metastasis given findings on prior PET-CT. Sinuses/Orbits: Clear sinuses.  No acute orbital findings. Other: Small right mastoid effusion. IMPRESSION: 1. Stable to slight interval decrease in the size of intracranial metastasis in the right frontoparietal region. 2. Decreased conspicuity of punctate lesion in the cerebellar vermis. 3. Multifocal osseous metastatic disease. Electronically Signed   By:  FMargaretha SheffieldM.D.   On: 10/26/2022 08:24       IMPRESSION/PLAN: 1. Progressive Metastatic Stage IIIC, cT3N3c triple negative invasive lobular carcinoma of the left breast with brain and bone disease. We reviewed the stable post treatment changes in her MRI and the resolution of the punctate focus seen on  her prior MRI. The brain oncology conference recommendations were to proceed with repeat MRI in 3 months time. Her recent reaction to the contrast could limit [place] somewhat but we will touch base with Dr. Jobe Igo in neuro radiology to see what he thinks would be best for future surveillance. She needs to follow up with Dr. Vickey Huger as well as she is interested in pursuing more systemic therapy rather than hospice based care which her PCP had suggested. We are in agreement with aggressive surveillance of her brain as well as systemic therapy if Dr. Delton Coombes is also in agreement.  2. Hypersensitivity versus Allergic Reaction to VueWay Contrast. She is able to tolerate this with premedication with prednisone and benadryl.  3. Left sided Headaches. The patient has relief with Excedrin. If her symptoms do not improve, we will discuss ENT or neurology evaluation.    This encounter was conducted via telephone.  The patient has provided two factor identification and has given verbal consent for this type of encounter and has been advised to only accept a meeting of this type in a secure network environment. The time spent during this encounter was 35 minutes including preparation, discussion, and coordination of the patient's care. The attendants for this meeting include  Hayden Pedro  and Charlynne Pander.  During the encounter,  Hayden Pedro was located at [facility] Radiation Oncology Department.  Charlynne Pander was located at home.   Carola Rhine, [facility]   **Disclaimer: This note was dictated with voice recognition software. Similar sounding words can  inadvertently be transcribed and this note may contain transcription errors which may not have been corrected upon publication of note.**

## 2022-10-27 ENCOUNTER — Other Ambulatory Visit: Payer: Self-pay | Admitting: Radiation Therapy

## 2022-10-27 DIAGNOSIS — C7931 Secondary malignant neoplasm of brain: Secondary | ICD-10-CM

## 2022-10-28 ENCOUNTER — Other Ambulatory Visit: Payer: Self-pay

## 2022-10-30 ENCOUNTER — Ambulatory Visit (HOSPITAL_COMMUNITY)
Admission: RE | Admit: 2022-10-30 | Discharge: 2022-10-30 | Disposition: A | Discharge status: Home / Self Care | Payer: Medicare Other | Source: Ambulatory Visit | Attending: Hematology | Admitting: Hematology

## 2022-10-30 DIAGNOSIS — Z0189 Encounter for other specified special examinations: Secondary | ICD-10-CM | POA: Diagnosis not present

## 2022-10-30 DIAGNOSIS — C50912 Malignant neoplasm of unspecified site of left female breast: Secondary | ICD-10-CM | POA: Diagnosis not present

## 2022-10-30 DIAGNOSIS — Z79899 Other long term (current) drug therapy: Secondary | ICD-10-CM | POA: Insufficient documentation

## 2022-10-30 DIAGNOSIS — I517 Cardiomegaly: Secondary | ICD-10-CM | POA: Insufficient documentation

## 2022-10-30 LAB — ECHOCARDIOGRAM COMPLETE
AR max vel: 1.65 cm2
AV Area VTI: 1.69 cm2
AV Area mean vel: 1.64 cm2
AV Mean grad: 8 mmHg
AV Peak grad: 15.5 mmHg
Ao pk vel: 1.97 m/s
Area-P 1/2: 3.08 cm2
Calc EF: 55 %
S' Lateral: 2.3 cm
Single Plane A2C EF: 60.4 %
Single Plane A4C EF: 52.1 %

## 2022-10-30 NOTE — Progress Notes (Signed)
  Echocardiogram 2D Echocardiogram has been performed.  Lana Fish 10/30/2022, 10:55 AM

## 2022-11-02 ENCOUNTER — Ambulatory Visit (HOSPITAL_BASED_OUTPATIENT_CLINIC_OR_DEPARTMENT_OTHER)
Admission: RE | Admit: 2022-11-02 | Discharge: 2022-11-02 | Disposition: A | Discharge status: Home / Self Care | Payer: Medicare Other | Source: Ambulatory Visit | Attending: Hematology | Admitting: Hematology

## 2022-11-02 DIAGNOSIS — C50912 Malignant neoplasm of unspecified site of left female breast: Secondary | ICD-10-CM | POA: Insufficient documentation

## 2022-11-02 DIAGNOSIS — R918 Other nonspecific abnormal finding of lung field: Secondary | ICD-10-CM | POA: Diagnosis not present

## 2022-11-02 DIAGNOSIS — K5730 Diverticulosis of large intestine without perforation or abscess without bleeding: Secondary | ICD-10-CM | POA: Diagnosis not present

## 2022-11-02 MED ORDER — HEPARIN SOD (PORK) LOCK FLUSH 100 UNIT/ML IV SOLN
500.0000 [IU] | Freq: Once | INTRAVENOUS | Status: AC
  Administered 2022-11-02: 500 [IU] via INTRAVENOUS

## 2022-11-02 MED ORDER — IOHEXOL 300 MG/ML  SOLN
100.0000 mL | Freq: Once | INTRAMUSCULAR | Status: AC | PRN
  Administered 2022-11-02: 85 mL via INTRAVENOUS

## 2022-11-03 NOTE — Progress Notes (Signed)
Pharmacist Chemotherapy Monitoring - Initial Assessment    Anticipated start date: 11/04/22   The following has been reviewed per standard work regarding the patient's treatment regimen: The patient's diagnosis, treatment plan and drug doses, and organ/hematologic function Lab orders and baseline tests specific to treatment regimen  The treatment plan start date, drug sequencing, and pre-medications Prior authorization status  Patient's documented medication list, including drug-drug interaction screen and prescriptions for anti-emetics and supportive care specific to the treatment regimen The drug concentrations, fluid compatibility, administration routes, and timing of the medications to be used The patient's access for treatment and lifetime cumulative dose history, if applicable  The patient's medication allergies and previous infusion related reactions, if applicable   Changes made to treatment plan:  N/A  Follow up needed:  N/A   Wynona Neat, [facility], 11/03/2022  1:51 PM

## 2022-11-04 ENCOUNTER — Inpatient Hospital Stay (HOSPITAL_BASED_OUTPATIENT_CLINIC_OR_DEPARTMENT_OTHER): Payer: Medicare Other | Admitting: Hematology

## 2022-11-04 ENCOUNTER — Inpatient Hospital Stay: Payer: Medicare Other

## 2022-11-04 VITALS — BP 113/68 | HR 64 | Temp 96.2°F | Resp 20

## 2022-11-04 DIAGNOSIS — C50912 Malignant neoplasm of unspecified site of left female breast: Secondary | ICD-10-CM

## 2022-11-04 DIAGNOSIS — Z171 Estrogen receptor negative status [ER-]: Secondary | ICD-10-CM | POA: Diagnosis not present

## 2022-11-04 DIAGNOSIS — Z5111 Encounter for antineoplastic chemotherapy: Secondary | ICD-10-CM | POA: Diagnosis not present

## 2022-11-04 DIAGNOSIS — C7951 Secondary malignant neoplasm of bone: Secondary | ICD-10-CM | POA: Diagnosis not present

## 2022-11-04 DIAGNOSIS — C7931 Secondary malignant neoplasm of brain: Secondary | ICD-10-CM | POA: Diagnosis not present

## 2022-11-04 DIAGNOSIS — Z95828 Presence of other vascular implants and grafts: Secondary | ICD-10-CM

## 2022-11-04 DIAGNOSIS — G629 Polyneuropathy, unspecified: Secondary | ICD-10-CM | POA: Diagnosis not present

## 2022-11-04 LAB — COMPREHENSIVE METABOLIC PANEL
ALT: 16 U/L (ref 0–44)
AST: 21 U/L (ref 15–41)
Albumin: 3.4 g/dL — ABNORMAL LOW (ref 3.5–5.0)
Alkaline Phosphatase: 100 U/L (ref 38–126)
Anion gap: 8 (ref 5–15)
BUN: 22 mg/dL (ref 8–23)
CO2: 25 mmol/L (ref 22–32)
Calcium: 8.4 mg/dL — ABNORMAL LOW (ref 8.9–10.3)
Chloride: 102 mmol/L (ref 98–111)
Creatinine, Ser: 0.8 mg/dL (ref 0.44–1.00)
GFR, Estimated: >60 mL/min (ref >60)
Glucose, Bld: 103 mg/dL — ABNORMAL HIGH (ref 70–99)
Potassium: 3.8 mmol/L (ref 3.5–5.1)
Sodium: 135 mmol/L (ref 135–145)
Total Bilirubin: 0.7 mg/dL (ref 0.3–1.2)
Total Protein: 6.7 g/dL (ref 6.5–8.1)

## 2022-11-04 LAB — CBC WITH DIFFERENTIAL/PLATELET
Abs Immature Granulocytes: 0.01 10*3/uL (ref 0.00–0.07)
Basophils Absolute: 0 10*3/uL (ref 0.0–0.1)
Basophils Relative: 0 %
Eosinophils Absolute: 0.2 10*3/uL (ref 0.0–0.5)
Eosinophils Relative: 6 %
HCT: 31.9 % — ABNORMAL LOW (ref 36.0–46.0)
Hemoglobin: 10.4 g/dL — ABNORMAL LOW (ref 12.0–15.0)
Immature Granulocytes: 0 %
Lymphocytes Relative: 35 %
Lymphs Abs: 1.1 10*3/uL (ref 0.7–4.0)
MCH: 33.9 pg (ref 26.0–34.0)
MCHC: 32.6 g/dL (ref 30.0–36.0)
MCV: 103.9 fL — ABNORMAL HIGH (ref 80.0–100.0)
Monocytes Absolute: 0.4 10*3/uL (ref 0.1–1.0)
Monocytes Relative: 12 %
Neutro Abs: 1.5 10*3/uL — ABNORMAL LOW (ref 1.7–7.7)
Neutrophils Relative %: 47 %
Platelets: 193 10*3/uL (ref 150–400)
RBC: 3.07 MIL/uL — ABNORMAL LOW (ref 3.87–5.11)
RDW: 13.5 % (ref 11.5–15.5)
WBC: 3.3 10*3/uL — ABNORMAL LOW (ref 4.0–10.5)
nRBC: 0 % (ref 0.0–0.2)

## 2022-11-04 LAB — MAGNESIUM: Magnesium: 1.8 mg/dL (ref 1.7–2.4)

## 2022-11-04 MED ORDER — DIPHENHYDRAMINE HCL 25 MG PO CAPS: 50.0000 mg | ORAL_CAPSULE | Freq: Once | ORAL | Status: DC

## 2022-11-04 MED ORDER — ACETAMINOPHEN 325 MG PO TABS
650.0000 mg | ORAL_TABLET | Freq: Once | ORAL | Status: AC
  Administered 2022-11-04: 650 mg via ORAL
  Filled 2022-11-04: qty 2

## 2022-11-04 MED ORDER — CETIRIZINE HCL 10 MG PO TABS
10.0000 mg | ORAL_TABLET | Freq: Once | ORAL | Status: AC
  Administered 2022-11-04: 10 mg via ORAL
  Filled 2022-11-04: qty 1

## 2022-11-04 MED ORDER — SODIUM CHLORIDE 0.9 % IV SOLN
10.0000 mg | Freq: Once | INTRAVENOUS | Status: AC
  Administered 2022-11-04: 10 mg via INTRAVENOUS
  Filled 2022-11-04: qty 10

## 2022-11-04 MED ORDER — PALONOSETRON HCL INJECTION 0.25 MG/5ML
0.2500 mg | Freq: Once | INTRAVENOUS | Status: AC
  Administered 2022-11-04: 0.25 mg via INTRAVENOUS
  Filled 2022-11-04: qty 5

## 2022-11-04 MED ORDER — DEXTROSE 5 % IV SOLN: Freq: Once | INTRAVENOUS | Status: AC

## 2022-11-04 MED ORDER — SODIUM CHLORIDE 0.9 % IV SOLN
150.0000 mg | Freq: Once | INTRAVENOUS | Status: AC
  Administered 2022-11-04: 150 mg via INTRAVENOUS
  Filled 2022-11-04: qty 150

## 2022-11-04 MED ORDER — HEPARIN SOD (PORK) LOCK FLUSH 100 UNIT/ML IV SOLN
500.0000 [IU] | Freq: Once | INTRAVENOUS | Status: AC | PRN
  Administered 2022-11-04: 500 [IU]

## 2022-11-04 MED ORDER — FAM-TRASTUZUMAB DERUXTECAN-NXKI CHEMO 100 MG IV SOLR
3.2000 mg/kg | Freq: Once | INTRAVENOUS | Status: AC
  Administered 2022-11-04: 240 mg via INTRAVENOUS
  Filled 2022-11-04: qty 12

## 2022-11-04 MED ORDER — SODIUM CHLORIDE 0.9% FLUSH
10.0000 mL | INTRAVENOUS | Status: DC | PRN
  Administered 2022-11-04: 10 mL

## 2022-11-04 MED ORDER — SODIUM CHLORIDE 0.9% FLUSH
10.0000 mL | INTRAVENOUS | Status: DC | PRN
  Administered 2022-11-04: 10 mL via INTRAVENOUS

## 2022-11-04 NOTE — Progress Notes (Signed)
Patients port flushed without difficulty.  Good blood return noted with no bruising or swelling noted at site.  Patient remains accessed for chemotherapy treatment.  

## 2022-11-04 NOTE — Progress Notes (Signed)
[facility] [address], [place] [address]    Clinic Day:  11/04/2022  Referring physician: Neale Burly, MD  Patient Care Team: Neale Burly, MD as PCP - General (Internal Medicine) Derek Jack, MD as Medical Oncologist (Medical Oncology)   ASSESSMENT & PLAN:   Assessment: 1.  T3N3c (stage IIIc) triple negative invasive lobular carcinoma of the left breast: - She felt lump in her left breast for more than 6 months, but thought it was secondary to fibrocystic disease which she had all her life.  When she started having pain, she reached out to Dr. Sherrie Sport. - Ultrasound-guided left breast and left axillary lymph node biopsy on 01/15/2021 - Pathology consistent with invasive lobular carcinoma, E-cadherin negative.  ER/PR/HER2 negative.  HER2 2+ by IHC, negative by FISH.  Ki-67 is 5%.  Lymph node core biopsy was consistent with metastatic carcinoma.  Grade 2. - PET scan on 02/03/2021 showed involvement of left supraclavicular, subpectoral, axillary lymph nodes along with the breast mass.  10 mm left lung nodule which is hypometabolic.  Spinal cord lesion at T12 level with a strong uptake. - MRI of the lumbar spine with and without contrast on 02/20/2021 showed no mass or abnormal enhancement within the canal at the T12 level to correspond to the site of PET scan positive.  No marrow replacing bone lesion. - 12 weeks of carboplatin and paclitaxel weekly and every 3 weeks pembrolizumab followed by 4 cycles of dose dense AC with pembrolizumab ([id number]) from 02/27/2021 through 10/01/2021 - PET scan on 10/16/2021: Reduction in metabolic activity in the size of the left axillary lymph node and no evidence of breast hypermetabolism on PET scan. - Left mastectomy and lymph node excision by Dr. Ninfa Linden on 11/20/2021 - Pathology: 6.2 cm invasive lobular carcinoma, grade 2, margins negative.  19/21 lymph nodes involved with macrometastasis.  ypT3, YPN3A - Adjuvant  pembrolizumab started on 01/01/2022. -CREATE-X capecitabine 1500 mg twice daily 2 weeks on/1 week off started on 01/01/2022.  Last dose of pembrolizumab on 05/11/2022 - Germline mutation testing ([facility]) negative - [employer] (06/04/2022): T p53, K-ras V14 I, CDKN2A.  MSI-high not detected. - PET scan (05/28/2022): Multifocal bone lesions involving T11 vertebral body, right posterior sixth rib, left femoral neck, left scapular glenoid.  No other metastatic disease. - Sacituzumab cycle 1 on 08/18/2022.  She was hospitalized with neutropenic fever and severe diarrhea from 08/24/2022 through 08/27/2022. - CTAP (10/25/2021): Generalized mild to moderate wall thickening throughout the nondistended large bowel with faint pericolonic fat haziness compatible with nonspecific infectious/inflammatory colitis.  No free air.  C. difficile x 2 was negative. - She has UG T1 A1*28 heterozygosity. - Cycle 2 Sacituzumab dose reduced by 50%.  Discontinued secondary to poor tolerance. - Enhertu 3.2 Mg/KG cycle 1 on 11/04/2022     2.  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  She is non-smoker. - Mother died of breast cancer.  Maternal grandmother died very young in her 38s, sister has fibrocystic disease.  Father died of lung cancer and was a smoker.    Plan: 1.  Metastatic TNBC to the bones: - She is doing very well today. - She wants to hold off on right breast mammogram at this time. - Reviewed CT CAP (11/02/2022): New T8 posterior body lesion, lytic.  Rest of the bone mets are stable.  No extraskeletal metastatic disease.  Multiple small lung nodules scattered throughout are  stable. - Reviewed labs today which showed normal LFTs.  CBC was grossly normal. - We talked about side effects of Enhertu in detail.  Will start at low-dose of 3.2 mg/kg because of her issues with colitis and diarrhea before. - She was recommended to use antidiarrheals  aggressively. - RTC 3 weeks for follow-up.   2.  Brain lesions (SRS to 3 brain lesions on 06/16/2022): - MRI brain (10/23/2022): Stable to interval decrease in size of the intracranial metastasis in the right frontoparietal region.  Decreased conspicuity of punctate lesion in the cerebellar vermis.  3.  Peripheral neuropathy: - Neuropathy in the toes has been stable.  4.  Hypomagnesemia: - Magnesium is normal at 1.8 today.  Continue OTC magnesium.  No orders of the defined types were placed in this encounter.     Beverly Gust Oliver,acting as a scribe for Derek Jack, MD.,have documented all relevant documentation on the behalf of Derek Jack, MD,as directed by  Derek Jack, MD while in the presence of Derek Jack, MD.   I, Derek Jack MD, have reviewed the above documentation for accuracy and completeness, and I agree with the above.   Derek Jack, MD   2/21/20245:35 PM  CHIEF COMPLAINT:   Diagnosis: Metastatic triple negative breast cancer to the bones   Cancer Staging  Invasive lobular carcinoma of left breast in female [facility]) Staging form: Breast, AJCC 8th Edition - Clinical stage from 01/23/2021: cT3, cN3c, G2, ER-, PR-, HER2- - Unsigned - Pathologic stage from 12/22/2021: No Stage Recommended (ypT3, pN3a, cM0, G2, ER-, PR-, HER2-) - Signed by Derek Jack, MD on 12/22/2021    Prior Therapy: 1.  Neoadjuvant chemotherapy with weekly carboplatin and paclitaxel and dose dense AC with pembrolizumab 2. Left mastectomy and lymph node excision by Dr. Ninfa Linden on 11/20/2021 3.  Sacituzumab on 08/18/2022 and 10/08/2021, discontinued due to poor tolerance.  Current Therapy:  Enhertu every 21 days    HISTORY OF PRESENT ILLNESS:   Oncology History  Invasive lobular carcinoma of left breast in female [facility])  01/23/2021 Initial Diagnosis   Invasive lobular carcinoma of left breast in female [facility])   02/19/2021 Genetic Testing    Negative genetic testing on the CancerNext-Expanded+RNAinsight panel.  SMARCB1 VUS identified.  The CancerNext-Expanded gene panel offered by [facility] and includes sequencing and rearrangement analysis for the following 77 genes: AIP, ALK, APC*, ATM*, AXIN2, BAP1, BARD1, BLM, BMPR1A, BRCA1*, BRCA2*, BRIP1*, CDC73, CDH1*, CDK4, CDKN1B, CDKN2A, CHEK2*, CTNNA1, DICER1, FANCC, FH, FLCN, GALNT12, KIF1B, LZTR1, MAX, MEN1, MET, MLH1*, MSH2*, MSH3, MSH6*, MUTYH*, NBN, NF1*, NF2, NTHL1, PALB2*, PHOX2B, PMS2*, POT1, PRKAR1A, PTCH1, PTEN*, RAD51C*, RAD51D*, RB1, RECQL, RET, SDHA, SDHAF2, SDHB, SDHC, SDHD, SMAD4, SMARCA4, SMARCB1, SMARCE1, STK11, SUFU, TMEM127, TP53*, TSC1, TSC2, VHL and XRCC2 (sequencing and deletion/duplication); EGFR, EGLN1, HOXB13, KIT, MITF, PDGFRA, POLD1, and POLE (sequencing only); EPCAM and GREM1 (deletion/duplication only). DNA and RNA analyses performed for * genes. The report date is February 19, 2021.   02/27/2021 - 05/11/2022 Chemotherapy   Patient is on Treatment Plan : BREAST Pembrolizumab + Carboplatin D1,8,15+ Paclitaxel D1,8,15 q21d X 4 cycles / Pembrolizumab + AC q21d x 4 cycles     12/22/2021 Cancer Staging   Staging form: Breast, AJCC 8th Edition - Pathologic stage from 12/22/2021: No Stage Recommended (ypT3, pN3a, cM0, G2, ER-, PR-, HER2-) - Signed by Derek Jack, MD on 12/22/2021 Histopathologic type: Lobular carcinoma, NOS Stage prefix: Post-therapy Method of lymph node assessment: Axillary lymph node dissection Multigene prognostic tests performed: None Histologic  grading system: 3 grade system   08/18/2022 - 10/08/2022 Chemotherapy   Patient is on Treatment Plan : BREAST METASTATIC Sacituzumab govitecan-hziy Ivette Loyal) D1,8 q21d     11/04/2022 -  Chemotherapy   Patient is on Treatment Plan : BREAST METASTATIC Fam-Trastuzumab Deruxtecan-nxki (Enhertu) (5.4) q21d        INTERVAL HISTORY:   Denise [name] is a 72 y.o. female presenting to clinic today for follow up of  Metastatic triple negative breast cancer to the bones. She was last seen by me on 10/22/2022.  Today, she states that she is doing well overall. Her appetite level is at 100%. Her energy level is at 100%. Due to a flare-up of her allergies, she does have mild asthma. Her voice is currently raspy due to a cough.  She does not currently require her cane or walker, but they are available in case she does.  She is using Breztri inhaler as needed.  PAST MEDICAL HISTORY:   Past Medical History: Past Medical History:  Diagnosis Date   Asthma    as child   Cancer ([place]) 12/2020   left breast IMC   Complication of anesthesia    pateitn states,' I coded when I had my D&Cmany years ago.   Dyspnea    Family history of breast cancer    Hypertension    Hypothyroidism    PONV (postoperative nausea and vomiting)    Port-A-Cath in place 02/26/2021    Surgical History: Past Surgical History:  Procedure Laterality Date   ABDOMINAL HYSTERECTOMY     BIOPSY  01/17/2018   Procedure: BIOPSY;  Surgeon: Rogene Houston, MD;  [facility];  Service: Endoscopy;;  duodenum,gastric   CHOLECYSTECTOMY     COLONOSCOPY WITH PROPOFOL N/A 01/17/2018   Procedure: COLONOSCOPY WITH PROPOFOL;  Surgeon: Rogene Houston, MD;  [facility];  Service: Endoscopy;  Laterality: N/A;  7:30   DILATION AND CURETTAGE OF UTERUS     ESOPHAGOGASTRODUODENOSCOPY (EGD) WITH PROPOFOL N/A 01/17/2018   Procedure: ESOPHAGOGASTRODUODENOSCOPY (EGD) WITH PROPOFOL;  Surgeon: Rogene Houston, MD;  [facility];  Service: Endoscopy;  Laterality: N/A;   POLYPECTOMY  01/17/2018   Procedure: POLYPECTOMY;  Surgeon: Rogene Houston, MD;  [facility];  Service: Endoscopy;;  transverse colon, cecal   PORTACATH PLACEMENT Right 02/17/2021   Procedure: INSERTION PORT-A-CATH;  Surgeon: Coralie Keens, MD;  Location: [facility];  Service: General;  Laterality: Right;   RADIOACTIVE SEED GUIDED  AXILLARY SENTINEL LYMPH NODE Left 11/20/2021   Procedure: RADIOACTIVE SEED GUIDED LEFT AXILLARY SENTINEL LYMPH NODE DISSECTION;  Surgeon: Coralie Keens, MD;  Location: [facility];  Service: General;  Laterality: Left;   TOTAL MASTECTOMY Left 11/20/2021   Procedure: LEFT TOTAL MASTECTOMY;  Surgeon: Coralie Keens, MD;  Location: [place];  Service: General;  Laterality: Left;    Social History: Social History   Socioeconomic History   Marital status: Widowed    Spouse name: Not on file   Number of children: 3   Years of education: Not on file   Highest education level: Not on file  Occupational History   Occupation: [employer]   Occupation: retired  Tobacco Use   Smoking status: Never   Smokeless tobacco: Never  Vaping Use   Vaping Use: Never used  Substance and Sexual Activity   Alcohol use: Never   Drug use: Never   Sexual activity: Not Currently    Birth control/protection:  Surgical  Other Topics Concern   Not on file  Social History Narrative   Not on file   Social Determinants of Health   Financial Resource Strain: Low Risk  (07/10/2022)   Overall Financial Resource Strain (CARDIA)    Difficulty of Paying Living Expenses: Not hard at all  Food Insecurity: No Food Insecurity (06/30/2022)   Hunger Vital Sign    Worried About Running Out of Food in the Last Year: Never true    Ran Out of Food in the Last Year: Never true  Transportation Needs: No Transportation Needs (07/10/2022)   PRAPARE - Hydrologist (Medical): No    Lack of Transportation (Non-Medical): No  Physical Activity: Insufficiently Active (01/22/2021)   Exercise Vital Sign    Days of Exercise per Week: 3 days    Minutes of Exercise per Session: 30 min  Stress: No Stress Concern Present (01/22/2021)   [place]    Feeling of Stress : Not at all  Social  Connections: Moderately Integrated (01/22/2021)   Social Connection and Isolation Panel [NHANES]    Frequency of Communication with Friends and Family: More than three times a week    Frequency of Social Gatherings with Friends and Family: More than three times a week    Attends Religious Services: More than 4 times per year    Active Member of [employer] or Organizations: No    Attends Music therapist: More than 4 times per year    Marital Status: Widowed  Intimate Partner Violence: Not At Risk (05/28/2022)   Humiliation, Afraid, Rape, and Kick questionnaire    Fear of Current or Ex-Partner: No    Emotionally Abused: No    Physically Abused: No    Sexually Abused: No    Family History: Family History  Problem Relation Age of Onset   Breast cancer Mother        dx in her 73s   Heart disease Mother    Thyroid disease Mother    Lung cancer Father        dx in his 51s   Thyroid disease Maternal Aunt    Thyroid nodules Maternal Grandmother 35       goiter   Thyroid disease Maternal Grandmother    Heart disease Maternal Grandfather    Heart disease Paternal Grandmother    Heart disease Paternal Grandfather    Thyroid disease Daughter    Thyroid disease Daughter    Cancer Maternal Uncle        NOS   Cancer Paternal Uncle        NOS   Breast cancer Cousin        pat first cousin died in her 39s;     Current Medications:  Current Outpatient Medications:    Acetaminophen (TYLENOL PO), Take 1 tablet by mouth as needed., Disp: , Rfl:    gabapentin (NEURONTIN) 300 MG capsule, Take 1 capsule by mouth at bedtime., Disp: , Rfl:    hydrALAZINE (APRESOLINE) 25 MG tablet, Take 25 mg by mouth 3 (three) times daily., Disp: , Rfl:    levothyroxine (SYNTHROID) 75 MCG tablet, Take 100 mcg by mouth daily before breakfast., Disp: , Rfl:    Melatonin 5 MG TABS, Take 10 mg by mouth. As needed for sleep, Disp: , Rfl:    olmesartan-hydrochlorothiazide (BENICAR HCT) 40-25 MG tablet, Take 1  tablet by mouth daily., Disp: , Rfl:  pantoprazole (PROTONIX) 40 MG tablet, Take 40 mg by mouth daily., Disp: , Rfl:    predniSONE (DELTASONE) 50 MG tablet, Take one tab po 13 hours prior, then one tab 7 hours prior, and then one tab 1 hour prior to MRI, Disp: 3 tablet, Rfl: 0 No current facility-administered medications for this visit.  Facility-Administered Medications Ordered in Other Visits:    sodium chloride flush (NS) 0.9 % injection 10 mL, 10 mL, Intracatheter, PRN, Derek Jack, MD, 10 mL at 11/04/22 1317   Allergies: Allergies  Allergen Reactions   Exforge [Amlodipine Besylate-Valsartan] Nausea And Vomiting   Gadolinium Derivatives Hives, Itching and Anaphylaxis    13hr prep prior to contrast admin  MRI iv contrast   Gadopiclenol Hives and Itching    13 hr prep prior to contrast admin    Tape Other (See Comments)    Redness and Irriatation   Cheese     Hard cheese   Levofloxacin In D5w Hives   Other    Proanthocyanidin    Strawberry Extract Diarrhea    Seeds,nuts, lettuce, grapes   Wild Lettuce Extract (Lactuca Virosa)    Yeast-Related Products Hives    Mold on bread    REVIEW OF SYSTEMS:   Review of Systems  Constitutional:  Negative for chills, fatigue and fever.  HENT:   Positive for trouble swallowing. Negative for lump/mass, mouth sores, nosebleeds and sore throat.   Eyes:  Negative for eye problems.  Respiratory:  Positive for cough (Allergies) and shortness of breath.   Cardiovascular:  Negative for chest pain, leg swelling and palpitations.  Gastrointestinal:  Negative for abdominal pain, constipation, diarrhea, nausea and vomiting.  Genitourinary:  Negative for bladder incontinence, difficulty urinating, dysuria, frequency, hematuria and nocturia.   Musculoskeletal:  Negative for arthralgias, back pain, flank pain, myalgias and neck pain.  Skin:  Negative for itching and rash.  Neurological:  Positive for headaches. Negative for dizziness and  numbness.  Hematological:  Does not bruise/bleed easily.  Psychiatric/Behavioral:  Positive for sleep disturbance. Negative for depression and suicidal ideas. The patient is not nervous/anxious.   All other systems reviewed and are negative.    VITALS:   There were no vitals taken for this visit.  Wt Readings from Last 3 Encounters:  11/04/22 164 lb 3.2 oz (74.5 kg)  10/22/22 163 lb (73.9 kg)  10/15/22 158 lb 8 oz (71.9 kg)    There is no height or weight on file to calculate BMI.  Performance status (ECOG): 1 - Symptomatic but completely ambulatory  PHYSICAL EXAM:   Physical Exam Vitals and nursing note reviewed. Exam conducted with a chaperone present.  Constitutional:      Appearance: Normal appearance.  Cardiovascular:     Rate and Rhythm: Normal rate and regular rhythm.     Pulses: Normal pulses.     Heart sounds: Normal heart sounds.  Pulmonary:     Effort: Pulmonary effort is normal.     Breath sounds: Normal breath sounds.  Abdominal:     Palpations: Abdomen is soft. There is no hepatomegaly, splenomegaly or mass.     Tenderness: There is no abdominal tenderness.  Musculoskeletal:     Right lower leg: No edema.     Left lower leg: No edema.  Lymphadenopathy:     Cervical: No cervical adenopathy.     Right cervical: No superficial, deep or posterior cervical adenopathy.    Left cervical: No superficial, deep or posterior cervical adenopathy.     Upper  Body:     Right upper body: No supraclavicular or axillary adenopathy.     Left upper body: No supraclavicular or axillary adenopathy.  Neurological:     General: No focal deficit present.     Mental Status: She is alert and oriented to person, place, and time.  Psychiatric:        Mood and Affect: Mood normal.        Behavior: Behavior normal.     LABS:      Latest Ref Rng & Units 11/04/2022    8:54 AM 10/22/2022    8:12 AM 10/15/2022   11:08 AM  CBC  WBC 4.0 - 10.5 K/uL 3.3  2.6  3.1   Hemoglobin 12.0 -  15.0 g/dL 10.4  9.3  9.9   Hematocrit 36.0 - 46.0 % 31.9  29.1  30.4   Platelets 150 - 400 K/uL 193  160  190       Latest Ref Rng & Units 11/04/2022    8:54 AM 10/22/2022    8:12 AM 10/15/2022   11:08 AM  CMP  Glucose 70 - 99 mg/dL 103  93  95   BUN 8 - 23 mg/dL 22  16  17   $ Creatinine 0.44 - 1.00 mg/dL 0.80  0.58  0.66   Sodium 135 - 145 mmol/L 135  135  133   Potassium 3.5 - 5.1 mmol/L 3.8  3.8  4.1   Chloride 98 - 111 mmol/L 102  104  103   CO2 22 - 32 mmol/L 25  25  22   $ Calcium 8.9 - 10.3 mg/dL 8.4  8.4  8.7   Total Protein 6.5 - 8.1 g/dL 6.7  6.7  7.0   Total Bilirubin 0.3 - 1.2 mg/dL 0.7  0.2  0.4   Alkaline Phos 38 - 126 U/L 100  94  89   AST 15 - 41 U/L 21  19  18   $ ALT 0 - 44 U/L 16  14  15      $ No results found for: "CEA1", "CEA" / No results found for: "CEA1", "CEA" No results found for: "PSA1" No results found for: "[id number]" No results found for: "CAN125"  No results found for: "TOTALPROTELP", "ALBUMINELP", "A1GS", "A2GS", "BETS", "BETA2SER", "GAMS", "MSPIKE", "SPEI" No results found for: "TIBC", "FERRITIN", "IRONPCTSAT" No results found for: "LDH"   STUDIES:   CT CHEST ABDOMEN PELVIS W CONTRAST  Result Date: 11/03/2022 CLINICAL DATA:  72 year old female with history of breast cancer with metastatic disease to the bones and brain. * Tracking Code: BO * EXAM: CT CHEST, ABDOMEN, AND PELVIS WITH CONTRAST TECHNIQUE: Multidetector CT imaging of the chest, abdomen and pelvis was performed following the standard protocol during bolus administration of intravenous contrast. RADIATION DOSE REDUCTION: This exam was performed according to the departmental dose-optimization program which includes automated exposure control, adjustment of the mA and/or kV according to patient size and/or use of iterative reconstruction technique. CONTRAST:  35m OMNIPAQUE IOHEXOL 300 MG/ML  SOLN COMPARISON:  PET-CT 05/28/2022. CT of the abdomen and pelvis 08/24/2022. FINDINGS: CT CHEST FINDINGS  Cardiovascular: Heart size is normal. There is no significant pericardial fluid, thickening or pericardial calcification. There is aortic atherosclerosis, as well as atherosclerosis of the great vessels of the mediastinum and the coronary arteries, including calcified atherosclerotic plaque in the left anterior descending coronary artery. Calcifications of the mitral annulus. Right internal jugular single-lumen Port-A-Cath with tip terminating in the distal superior vena cava. Mediastinum/Nodes: No pathologically enlarged  mediastinal, hilar or internal mammary lymph nodes. Esophagus is unremarkable in appearance. No axillary lymphadenopathy. Numerous surgical clips are noted in the left axillary and subpectoral regions from prior lymph node dissection. Lungs/Pleura: Left lower lobe pulmonary nodule measuring 1.2 x 0.9 cm with well-defined macrolobulated margins and some central calcification, stable, previously not hypermetabolic) presumably a benign lesion such as a hamartoma). Multiple other smaller pulmonary nodules are scattered elsewhere throughout the lungs bilaterally, generally similar to the prior PET-CT, most notable for a cluster of small pulmonary nodules in the left lower lobe, largest of which measures 6 x 4 mm (mean diameter 5 mm) on axial image 101 of series 4, nonspecific. No larger more suspicious appearing pulmonary nodules or masses are noted. No acute consolidative airspace disease. No pleural effusions. Mild post infectious scarring in the medial aspect of the right upper lobe and medial segment of the right middle lobe. Musculoskeletal: Status post left modified radical mastectomy. Chronic postoperative fluid collection and/or collapsed breast implant noted in this region without well-defined soft tissue mass. Multiple predominantly sclerotic lesions are again noted in the visualized axial and appendicular skeleton, compatible with widespread metastatic disease to the bones, most notable for a  sclerotic lesion in the left scapula involving portions of the glenoid (axial image 7 of series 2), and a large sclerotic lesion in the T11 vertebral body measuring approximately 2.6 cm in diameter. Predominantly lytic lesion noted in the posterior aspect of the T8 vertebral body measuring 1.5 cm in diameter (sagittal image 78 of series 6), not readily apparent on prior PET-CT. CT ABDOMEN PELVIS FINDINGS Hepatobiliary: No suspicious cystic or solid hepatic lesions. No intra or extrahepatic biliary ductal dilatation. Gallbladder is not visualized, presumably surgically absent (no surgical clips are noted in the gallbladder fossa). Pancreas: No pancreatic mass. No pancreatic ductal dilatation. No pancreatic or peripancreatic fluid collections or inflammatory changes. Spleen: Unremarkable. Adrenals/Urinary Tract: Bilateral kidneys and bilateral adrenal glands are normal in appearance. No hydroureteronephrosis. Urinary bladder is nearly completely decompressed, the base of the urinary bladder extends below the level of the pubococcygeal line at rest, indicative of a cystocele. Stomach/Bowel: The appearance of the stomach is normal. There is no pathologic dilatation of small bowel or colon. Numerous colonic diverticula are noted, most evident in the sigmoid colon, without surrounding inflammatory changes to suggest an acute diverticulitis at this time. Hypermobile cecum (normal anatomical variant) incidentally noted. The appendix is not confidently identified and may be surgically absent. Regardless, there are no inflammatory changes noted adjacent to the cecum to suggest the presence of an acute appendicitis at this time. Vascular/Lymphatic: Atherosclerotic calcifications in the abdominal aorta and pelvic vasculature, without evidence of aneurysm or dissection. No lymphadenopathy noted in the abdomen or pelvis. Reproductive: Status post hysterectomy. Ovaries are not confidently identified may be surgically absent or  atrophic. Other: No significant volume of ascites.  No pneumoperitoneum. Musculoskeletal: Multiple predominantly lytic lesions are noted throughout the visualized axial and appendicular skeleton, similar to recent CT examination, compatible with widespread metastatic disease to the bones. IMPRESSION: 1. Today's study again demonstrates widespread metastatic disease to the bones. In the thorax, there is a new predominantly lytic lesion in the posterior aspect of the T8 vertebral body which was not readily apparent on prior PET-CT. Findings in the bones visualized in the abdomen and pelvis are similar to recent CT the abdomen and pelvis 08/24/2022. 2. No definite extra skeletal metastatic disease confidently identified in the chest, abdomen or pelvis. 3. There are multiple small pulmonary  nodules scattered throughout the lungs bilaterally, grossly stable compared to prior PET-CT. These are nonspecific, but favored to be benign. Continued attention on follow-up studies is recommended. Included among disease is a large partially calcified clearly benign lesion in the left lower lobe, compatible with a benign hamartoma. 4. Colonic diverticulosis without evidence of acute diverticulitis at this time. 5. Aortic atherosclerosis, in addition to left anterior descending coronary artery disease. Assessment for potential risk factor modification, dietary therapy or pharmacologic therapy may be warranted, if clinically indicated. 6. Additional incidental findings, as above. Electronically Signed   By: Vinnie Langton M.D.   On: 11/03/2022 09:03   ECHOCARDIOGRAM COMPLETE  Result Date: 10/30/2022    ECHOCARDIOGRAM REPORT   Patient Name:   Abagayle A Gossett Date of Exam: 10/30/2022 Medical Rec #:  [id number]         Height:       65.0 in Accession #:    [id number]        Weight:       163.0 lb Date of Birth:  [birth year]         BSA:          1.813 m Patient Age:    51 years          BP:           124/77 mmHg Patient Gender: F                  HR:           73 bpm. Exam Location:  Outpatient Procedure: 2D Echo and Strain Analysis Indications:    Chemo  History:        Patient has prior history of Echocardiogram examinations, most                 recent 02/21/2021.  Sonographer:    Harvie Junior Referring Phys: [phone] Derek Jack  Sonographer Comments: Global longitudinal strain was attempted. IMPRESSIONS  1. Left ventricular ejection fraction by 3D volume is 60 %. The left ventricle has normal function. The left ventricle has no regional wall motion abnormalities. There is mild concentric left ventricular hypertrophy. Left ventricular diastolic parameters are consistent with Grade I diastolic dysfunction (impaired relaxation).  2. Right ventricular systolic function is normal. The right ventricular size is normal. There is normal pulmonary artery systolic pressure.  3. The mitral valve is normal in structure. No evidence of mitral valve regurgitation. No evidence of mitral stenosis.  4. The aortic valve is normal in structure. Aortic valve regurgitation is not visualized. No aortic stenosis is present.  5. The inferior vena cava is normal in size with greater than 50% respiratory variability, suggesting right atrial pressure of 3 mmHg. FINDINGS  Left Ventricle: Left ventricular ejection fraction by 3D volume is 60 %. The left ventricle has normal function. The left ventricle has no regional wall motion abnormalities. The global longitudinal strain is normal despite suboptimal segment tracking. The left ventricular internal cavity size was normal in size. There is mild concentric left ventricular hypertrophy. Left ventricular diastolic parameters are consistent with Grade I diastolic dysfunction (impaired relaxation). Right Ventricle: The right ventricular size is normal. No increase in right ventricular wall thickness. Right ventricular systolic function is normal. There is normal pulmonary artery systolic pressure. The tricuspid  regurgitant velocity is 2.09 m/s, and  with an assumed right atrial pressure of 3 mmHg, the estimated right ventricular systolic pressure is 123XX123 mmHg. Left Atrium: Left atrial size was normal in  size. Right Atrium: Right atrial size was normal in size. Pericardium: There is no evidence of pericardial effusion. Presence of epicardial fat layer. Mitral Valve: The mitral valve is normal in structure. No evidence of mitral valve regurgitation. No evidence of mitral valve stenosis. Tricuspid Valve: The tricuspid valve is normal in structure. Tricuspid valve regurgitation is not demonstrated. No evidence of tricuspid stenosis. Aortic Valve: The aortic valve is normal in structure. Aortic valve regurgitation is not visualized. No aortic stenosis is present. Aortic valve mean gradient measures 8.0 mmHg. Aortic valve peak gradient measures 15.5 mmHg. Aortic valve area, by VTI measures 1.69 cm. Pulmonic Valve: The pulmonic valve was normal in structure. Pulmonic valve regurgitation is not visualized. No evidence of pulmonic stenosis. Aorta: The aortic root is normal in size and structure. Venous: The inferior vena cava is normal in size with greater than 50% respiratory variability, suggesting right atrial pressure of 3 mmHg. IAS/Shunts: No atrial level shunt detected by color flow Doppler.  LEFT VENTRICLE PLAX 2D LVIDd:         3.40 cm         Diastology LVIDs:         2.30 cm         LV e' medial:    5.37 cm/s LV PW:         1.10 cm         LV E/e' medial:  8.9 LV IVS:        1.10 cm         LV e' lateral:   11.40 cm/s LVOT diam:     2.00 cm         LV E/e' lateral: 4.2 LV SV:         55 LV SV Index:   30 LVOT Area:     3.14 cm        3D Volume EF                                LV 3D EF:    Left                                             ventricul LV Volumes (MOD)                            ar LV vol d, MOD    63.4 ml                    ejection A2C:                                        fraction LV vol d, MOD    90.0  ml                    by 3D A4C:                                        volume is LV vol s, MOD    25.1 ml  60 %. A2C: LV vol s, MOD    43.1 ml A4C:                           3D Volume EF: LV SV MOD A2C:   38.3 ml       3D EF:        60 % LV SV MOD A4C:   90.0 ml       LV EDV:       107 ml LV SV MOD BP:    42.1 ml       LV ESV:       43 ml                                LV SV:        64 ml RIGHT VENTRICLE RV Basal diam:  3.10 cm RV Mid diam:    2.10 cm RV S prime:     18.15 cm/s TAPSE (M-mode): 1.8 cm LEFT ATRIUM             Index        RIGHT ATRIUM           Index LA diam:        2.80 cm 1.54 cm/m   RA Area:     11.10 cm LA Vol (A2C):   36.8 ml 20.29 ml/m  RA Volume:   23.60 ml  13.01 ml/m LA Vol (A4C):   34.1 ml 18.80 ml/m LA Biplane Vol: 36.1 ml 19.91 ml/m  AORTIC VALVE                     PULMONIC VALVE AV Area (Vmax):    1.65 cm      PV Vmax:       0.96 m/s AV Area (Vmean):   1.64 cm      PV Peak grad:  3.7 mmHg AV Area (VTI):     1.69 cm AV Vmax:           197.00 cm/s AV Vmean:          126.000 cm/s AV VTI:            0.326 m AV Peak Grad:      15.5 mmHg AV Mean Grad:      8.0 mmHg LVOT Vmax:         103.50 cm/s LVOT Vmean:        65.800 cm/s LVOT VTI:          0.176 m LVOT/AV VTI ratio: 0.54  AORTA Ao Root diam: 3.30 cm Ao Asc diam:  3.20 cm MITRAL VALVE               TRICUSPID VALVE MV Area (PHT): 3.08 cm    TR Peak grad:   17.5 mmHg MV Decel Time: 246 msec    TR Vmax:        209.00 cm/s MV E velocity: 48.00 cm/s MV A velocity: 90.80 cm/s  SHUNTS MV E/A ratio:  0.53        Systemic VTI:  0.18 m                            Systemic Diam: 2.00 cm Kardie Tobb DO Electronically signed by Berniece Salines DO Signature Date/Time: 10/30/2022/12:23:49 PM    Final  MR Brain W Wo Contrast  Result Date: 10/26/2022 CLINICAL DATA:  Brain metastases, assess treatment response 3T SRS Protocol - pt had allergic reaction to Vueway in the past. Will be taking premed for contast EXAM: MRI HEAD  WITHOUT AND WITH CONTRAST TECHNIQUE: Multiplanar, multiecho pulse sequences of the brain and surrounding structures were obtained without and with intravenous contrast. CONTRAST:  75m MULTIHANCE GADOBENATE DIMEGLUMINE 529 MG/ML IV SOLN COMPARISON:  MRI head November 1, 23. FINDINGS: Brain: Stable to slight interval decrease in the size of intracranial metastasis in the right frontoparietal region. Decreased conspicuity of punctate lesion in the cerebellar vermis. No new lesions identified. No evidence of acute infarct, acute hemorrhage, hydrocephalus, or midline shift. Vascular: Major arterial flow voids are maintained at the skull base. Skull and upper cervical spine: Multifocal osseous enhancing lesions, compatible with metastasis given findings on prior PET-CT. Sinuses/Orbits: Clear sinuses.  No acute orbital findings. Other: Small right mastoid effusion. IMPRESSION: 1. Stable to slight interval decrease in the size of intracranial metastasis in the right frontoparietal region. 2. Decreased conspicuity of punctate lesion in the cerebellar vermis. 3. Multifocal osseous metastatic disease. Electronically Signed   By: FMargaretha SheffieldM.D.   On: 10/26/2022 08:24

## 2022-11-04 NOTE — Progress Notes (Signed)
Labs reviewed with MD today at office visit. Will proceed with D1 today per MD.   Treatment given per orders. Patient tolerated it well without problems. Vitals stable and discharged home from clinic ambulatory. Follow up as scheduled.

## 2022-11-04 NOTE — Patient Instructions (Signed)
[place] at [facility] Discharge Instructions   You were seen and examined today by Dr. Delton Coombes.  He reviewed the results of your lab work which are normal/stable.   He reviewed the results of your CT scan. There is a new lesion in your thoracic spine (T8), but it is contained to the bone. All other areas were stable.   We will proceed with your first treatment of [place] today.   Return as scheduled.    Thank you for choosing [place] at [facility] to provide your oncology and hematology care.  To afford each patient quality time with our provider, please arrive at least 15 minutes before your scheduled appointment time.   If you have a lab appointment with the [facility] please come in thru the Main Entrance and check in at the main information desk.  You need to re-schedule your appointment should you arrive 10 or more minutes late.  We strive to give you quality time with our providers, and arriving late affects you and other patients whose appointments are after yours.  Also, if you no show three or more times for appointments you may be dismissed from the clinic at the providers discretion.     Again, thank you for choosing [facility].  Our hope is that these requests will decrease the amount of time that you wait before being seen by our physicians.       _____________________________________________________________  Should you have questions after your visit to [facility], please contact our office at [phone] and follow the prompts.  Our office hours are 8:00 a.m. and 4:30 p.m. Monday - Friday.  Please note that voicemails left after 4:00 p.m. may not be returned until the following business day.  We are closed weekends and major holidays.  You do have access to a nurse 24-7, just call the main number to the clinic [phone] and do not press any options, hold on the line and a nurse will  answer the phone.    For prescription refill requests, have your pharmacy contact our office and allow 72 hours.    Due to Covid, you will need to wear a mask upon entering the hospital. If you do not have a mask, a mask will be given to you at the Main Entrance upon arrival. For doctor visits, patients may have 1 support person age 94 or older with them. For treatment visits, patients can not have anyone with them due to social distancing guidelines and our immunocompromised population.

## 2022-11-04 NOTE — Progress Notes (Signed)
Patient has been examined by Dr. Delton Coombes. Vital signs and labs have been reviewed by MD - ANC, Creatinine, LFTs, hemoglobin, and platelets are within treatment parameters per M.D. - pt may proceed with treatment.  Primary RN and pharmacy notified.

## 2022-11-05 ENCOUNTER — Ambulatory Visit: Payer: Medicare Other | Admitting: Hematology

## 2022-11-05 ENCOUNTER — Ambulatory Visit: Payer: Medicare Other

## 2022-11-05 ENCOUNTER — Other Ambulatory Visit: Payer: Medicare Other

## 2022-11-05 ENCOUNTER — Telehealth: Payer: Self-pay

## 2022-11-05 NOTE — Telephone Encounter (Signed)
Patient called for 24-hour call-back. Patient states she is doing good so far. Patient made aware to call [facility] if any symptoms arise. Patient verbalized understanding.

## 2022-11-09 ENCOUNTER — Other Ambulatory Visit: Payer: Self-pay | Admitting: *Deleted

## 2022-11-09 MED ORDER — AMOXICILLIN-POT CLAVULANATE 875-125 MG PO TABS: 1.0000 | ORAL_TABLET | Freq: Two times a day (BID) | ORAL | 0 refills | Status: AC

## 2022-11-09 NOTE — Telephone Encounter (Signed)
Patient is calling complaining of coughing, wheezing and tightness in chest.  Per Dr. Delton Coombes Augmentin 875 mg bid x 7 days and to advise if no improvement.  Script sent and patient notified.

## 2022-11-11 ENCOUNTER — Other Ambulatory Visit: Payer: Self-pay

## 2022-11-11 DIAGNOSIS — C50919 Malignant neoplasm of unspecified site of unspecified female breast: Secondary | ICD-10-CM | POA: Diagnosis not present

## 2022-11-11 DIAGNOSIS — I10 Essential (primary) hypertension: Secondary | ICD-10-CM | POA: Diagnosis not present

## 2022-11-11 DIAGNOSIS — E038 Other specified hypothyroidism: Secondary | ICD-10-CM | POA: Diagnosis not present

## 2022-11-11 DIAGNOSIS — K219 Gastro-esophageal reflux disease without esophagitis: Secondary | ICD-10-CM | POA: Diagnosis not present

## 2022-11-12 ENCOUNTER — Encounter: Payer: Self-pay | Admitting: Radiology

## 2022-11-29 NOTE — Progress Notes (Signed)
[facility] [address], [place] [address]    Clinic Day:  11/30/2022  Referring physician: Derek Jack, MD  Patient Care Team: Neale Burly, MD as PCP - General (Internal Medicine) Derek Jack, MD as Medical Oncologist (Medical Oncology)   ASSESSMENT & PLAN:   Assessment: 1.  T3N3c (stage IIIc) triple negative invasive lobular carcinoma of the left breast: - She felt lump in her left breast for more than 6 months, but thought it was secondary to fibrocystic disease which she had all her life.  When she started having pain, she reached out to Dr. Sherrie Sport. - Ultrasound-guided left breast and left axillary lymph node biopsy on 01/15/2021 - Pathology consistent with invasive lobular carcinoma, E-cadherin negative.  ER/PR/HER2 negative.  HER2 2+ by IHC, negative by FISH.  Ki-67 is 5%.  Lymph node core biopsy was consistent with metastatic carcinoma.  Grade 2. - PET scan on 02/03/2021 showed involvement of left supraclavicular, subpectoral, axillary lymph nodes along with the breast mass.  10 mm left lung nodule which is hypometabolic.  Spinal cord lesion at T12 level with a strong uptake. - MRI of the lumbar spine with and without contrast on 02/20/2021 showed no mass or abnormal enhancement within the canal at the T12 level to correspond to the site of PET scan positive.  No marrow replacing bone lesion. - 12 weeks of carboplatin and paclitaxel weekly and every 3 weeks pembrolizumab followed by 4 cycles of dose dense AC with pembrolizumab ([id number]) from 02/27/2021 through 10/01/2021 - PET scan on 10/16/2021: Reduction in metabolic activity in the size of the left axillary lymph node and no evidence of breast hypermetabolism on PET scan. - Left mastectomy and lymph node excision by Dr. Ninfa Linden on 11/20/2021 - Pathology: 6.2 cm invasive lobular carcinoma, grade 2, margins negative.  19/21 lymph nodes involved with macrometastasis.  ypT3, YPN3A - Adjuvant  pembrolizumab started on 01/01/2022. -CREATE-X capecitabine 1500 mg twice daily 2 weeks on/1 week off started on 01/01/2022.  Last dose of pembrolizumab on 05/11/2022 - Germline mutation testing ([facility]) negative - [employer] (06/04/2022): T p53, K-ras V14 I, CDKN2A.  MSI-high not detected. - PET scan (05/28/2022): Multifocal bone lesions involving T11 vertebral body, right posterior sixth rib, left femoral neck, left scapular glenoid.  No other metastatic disease. - Sacituzumab cycle 1 on 08/18/2022.  She was hospitalized with neutropenic fever and severe diarrhea from 08/24/2022 through 08/27/2022. - CTAP (10/25/2021): Generalized mild to moderate wall thickening throughout the nondistended large bowel with faint pericolonic fat haziness compatible with nonspecific infectious/inflammatory colitis.  No free air.  C. difficile x 2 was negative. - She has UG T1 A1*28 heterozygosity. - Cycle 2 Sacituzumab dose reduced by 50%.  Discontinued secondary to poor tolerance. - Enhertu 3.2 Mg/KG cycle 1 on 11/04/2022     2.  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  She is non-smoker. - Mother died of breast cancer.  Maternal grandmother died very young in her 93s, sister has fibrocystic disease.  Father died of lung cancer and was a smoker.    Plan: 1.  Metastatic TNBC to the bones: - She has tolerated Enhertu reasonably well. - She complained of left hip proximal muscle weakness along with pain in the left shin.  She reported her toes are curling up at nighttime and go number.  She is taking gabapentin 300 mg at bedtime which is not helping. - I have reviewed  labs today which showed normal LFTs.  CBC shows white count is 2.6 with ANC of 0.7. - Recommend MRI of the thoracic and lumbar spine with and without contrast ASAP. - Will hold her treatment today.  Will give G-CSF 480 mcg and recheck her white count tomorrow.  Will resume treatment if ANC is  close to 1500. - Will plan on adding long-acting G-CSF to her cycle 2. - RTC 3 weeks for follow-up.   2.  Brain lesions (SRS to 3 brain lesions on 06/16/2022): - MRI brain on 10/23/2022: Stable to interval decrease in size of the previously treated lesions.  3.  Peripheral neuropathy: - Neuropathy in the toes has been stable.  She reported left foot toes curling up and going numb at nighttime.  Recommend increasing gabapentin to 600 mg at bedtime.  4.  Hypomagnesemia: - Magnesium is normal at 1.8.  Continue OTC magnesium supplements.  Orders Placed This Encounter  Procedures   CT CHEST ABDOMEN PELVIS W CONTRAST    Standing Status:   Future    Standing Expiration Date:   11/30/2023    Order Specific Question:   If indicated for the ordered procedure, I authorize the administration of contrast media per Radiology protocol    Answer:   Yes    Order Specific Question:   Does the patient have a contrast media/X-ray dye allergy?    Answer:   No    Order Specific Question:   Preferred imaging location?    Answer:   [facility]    Order Specific Question:   Release to patient    Answer:   Immediate    Order Specific Question:   Is Oral Contrast requested for this exam?    Answer:   Yes, Per Radiology protocol   MR Lumbar Spine W Wo Contrast    Standing Status:   Future    Standing Expiration Date:   11/30/2023    Order Specific Question:   If indicated for the ordered procedure, I authorize the administration of contrast media per Radiology protocol    Answer:   Yes    Order Specific Question:   What is the patient's sedation requirement?    Answer:   No Sedation    Order Specific Question:   Does the patient have a pacemaker or implanted devices?    Answer:   No    Order Specific Question:   Use SRS Protocol?    Answer:   No    Order Specific Question:   Preferred imaging location?    Answer:   [facility] (table limit [phone])   MR Thoracic Spine W Wo Contrast     Standing Status:   Future    Standing Expiration Date:   11/30/2023    Order Specific Question:   GRA to provide read?    Answer:   Yes    Order Specific Question:   If indicated for the ordered procedure, I authorize the administration of contrast media per Radiology protocol    Answer:   Yes    Order Specific Question:   What is the patient's sedation requirement?    Answer:   No Sedation    Order Specific Question:   Use SRS Protocol?    Answer:   No    Order Specific Question:   Does the patient have a pacemaker or implanted devices?    Answer:   No    Order Specific Question:   Preferred imaging  location?    Answer:   [facility] (table limit - 550lbs)     I,Alexis Herring,acting as a scribe for Derek Jack, MD.,have documented all relevant documentation on the behalf of Derek Jack, MD,as directed by  Derek Jack, MD while in the presence of Derek Jack, MD.  I, Derek Jack MD, have reviewed the above documentation for accuracy and completeness, and I agree with the above.   Derek Jack, MD   3/18/20246:00 PM  CHIEF COMPLAINT:   Diagnosis: Metastatic triple negative breast cancer to the bones   Cancer Staging  Invasive lobular carcinoma of left breast in female [facility]) Staging form: Breast, AJCC 8th Edition - Clinical stage from 01/23/2021: cT3, cN3c, G2, ER-, PR-, HER2- - Unsigned - Pathologic stage from 12/22/2021: No Stage Recommended (ypT3, pN3a, cM0, G2, ER-, PR-, HER2-) - Signed by Derek Jack, MD on 12/22/2021    Prior Therapy: 1.  Neoadjuvant chemotherapy with weekly carboplatin and paclitaxel and dose dense AC with pembrolizumab 2. Left mastectomy and lymph node excision by Dr. Ninfa Linden on 11/20/2021 3.  Sacituzumab on 08/18/2022 and 10/08/2021, discontinued due to poor tolerance.  Current Therapy:  Enhertu every 21 days    HISTORY OF PRESENT ILLNESS:   Oncology History  Invasive lobular carcinoma of  left breast in female [facility])  01/23/2021 Initial Diagnosis   Invasive lobular carcinoma of left breast in female [facility])   02/19/2021 Genetic Testing   Negative genetic testing on the CancerNext-Expanded+RNAinsight panel.  SMARCB1 VUS identified.  The CancerNext-Expanded gene panel offered by [facility] and includes sequencing and rearrangement analysis for the following 77 genes: AIP, ALK, APC*, ATM*, AXIN2, BAP1, BARD1, BLM, BMPR1A, BRCA1*, BRCA2*, BRIP1*, CDC73, CDH1*, CDK4, CDKN1B, CDKN2A, CHEK2*, CTNNA1, DICER1, FANCC, FH, FLCN, GALNT12, KIF1B, LZTR1, MAX, MEN1, MET, MLH1*, MSH2*, MSH3, MSH6*, MUTYH*, NBN, NF1*, NF2, NTHL1, PALB2*, PHOX2B, PMS2*, POT1, PRKAR1A, PTCH1, PTEN*, RAD51C*, RAD51D*, RB1, RECQL, RET, SDHA, SDHAF2, SDHB, SDHC, SDHD, SMAD4, SMARCA4, SMARCB1, SMARCE1, STK11, SUFU, TMEM127, TP53*, TSC1, TSC2, VHL and XRCC2 (sequencing and deletion/duplication); EGFR, EGLN1, HOXB13, KIT, MITF, PDGFRA, POLD1, and POLE (sequencing only); EPCAM and GREM1 (deletion/duplication only). DNA and RNA analyses performed for * genes. The report date is February 19, 2021.   02/27/2021 - 05/11/2022 Chemotherapy   Patient is on Treatment Plan : BREAST Pembrolizumab + Carboplatin D1,8,15+ Paclitaxel D1,8,15 q21d X 4 cycles / Pembrolizumab + AC q21d x 4 cycles     12/22/2021 Cancer Staging   Staging form: Breast, AJCC 8th Edition - Pathologic stage from 12/22/2021: No Stage Recommended (ypT3, pN3a, cM0, G2, ER-, PR-, HER2-) - Signed by Derek Jack, MD on 12/22/2021 Histopathologic type: Lobular carcinoma, NOS Stage prefix: Post-therapy Method of lymph node assessment: Axillary lymph node dissection Multigene prognostic tests performed: None Histologic grading system: 3 grade system   08/18/2022 - 10/08/2022 Chemotherapy   Patient is on Treatment Plan : BREAST METASTATIC Sacituzumab govitecan-hziy Ivette Loyal) D1,8 q21d     11/04/2022 -  Chemotherapy   Patient is on Treatment Plan : BREAST METASTATIC  Fam-Trastuzumab Deruxtecan-nxki (Enhertu) (5.4) q21d        INTERVAL HISTORY:   Oneisha is a 72 y.o. female presenting to clinic today for follow up of Metastatic triple negative breast cancer to the bones. She was last seen by me on 11/04/22.  Today, she states that she is doing well overall. Her appetite level is at 100%. Her energy level is at 65%.   PAST MEDICAL HISTORY:   Past Medical History: Past Medical History:  Diagnosis Date   Asthma    as child   Cancer ([place]) 12/2020   left breast IMC   Complication of anesthesia    pateitn states,' I coded when I had my D&Cmany years ago.   Dyspnea    Family history of breast cancer    Hypertension    Hypothyroidism    PONV (postoperative nausea and vomiting)    Port-A-Cath in place 02/26/2021    Surgical History: Past Surgical History:  Procedure Laterality Date   ABDOMINAL HYSTERECTOMY     BIOPSY  01/17/2018   Procedure: BIOPSY;  Surgeon: Rogene Houston, MD;  [facility];  Service: Endoscopy;;  duodenum,gastric   CHOLECYSTECTOMY     COLONOSCOPY WITH PROPOFOL N/A 01/17/2018   Procedure: COLONOSCOPY WITH PROPOFOL;  Surgeon: Rogene Houston, MD;  [facility];  Service: Endoscopy;  Laterality: N/A;  7:30   DILATION AND CURETTAGE OF UTERUS     ESOPHAGOGASTRODUODENOSCOPY (EGD) WITH PROPOFOL N/A 01/17/2018   Procedure: ESOPHAGOGASTRODUODENOSCOPY (EGD) WITH PROPOFOL;  Surgeon: Rogene Houston, MD;  [facility];  Service: Endoscopy;  Laterality: N/A;   POLYPECTOMY  01/17/2018   Procedure: POLYPECTOMY;  Surgeon: Rogene Houston, MD;  [facility];  Service: Endoscopy;;  transverse colon, cecal   PORTACATH PLACEMENT Right 02/17/2021   Procedure: INSERTION PORT-A-CATH;  Surgeon: Coralie Keens, MD;  Location: [place];  Service: General;  Laterality: Right;   RADIOACTIVE SEED GUIDED AXILLARY SENTINEL LYMPH NODE Left 11/20/2021   Procedure: RADIOACTIVE SEED GUIDED LEFT  AXILLARY SENTINEL LYMPH NODE DISSECTION;  Surgeon: Coralie Keens, MD;  Location: [place];  Service: General;  Laterality: Left;   TOTAL MASTECTOMY Left 11/20/2021   Procedure: LEFT TOTAL MASTECTOMY;  Surgeon: Coralie Keens, MD;  Location: [place];  Service: General;  Laterality: Left;    Social History: Social History   Socioeconomic History   Marital status: Widowed    Spouse name: Not on file   Number of children: 3   Years of education: Not on file   Highest education level: Not on file  Occupational History   Occupation: [employer]   Occupation: retired  Tobacco Use   Smoking status: Never   Smokeless tobacco: Never  Vaping Use   Vaping Use: Never used  Substance and Sexual Activity   Alcohol use: Never   Drug use: Never   Sexual activity: Not Currently    Birth control/protection: Surgical  Other Topics Concern   Not on file  Social History Narrative   Not on file   Social Determinants of Health   Financial Resource Strain: [place]  (07/10/2022)   Overall Financial Resource Strain (CARDIA)    Difficulty of Paying Living Expenses: Not hard at all  Food Insecurity: No Food Insecurity (06/30/2022)   Hunger Vital Sign    Worried About Running Out of Food in the Last Year: Never true    [place] in the Last Year: Never true  Transportation Needs: No Transportation Needs (07/10/2022)   PRAPARE - Hydrologist (Medical): No    Lack of Transportation (Non-Medical): No  Physical Activity: Insufficiently Active (01/22/2021)   Exercise Vital Sign    Days of Exercise per Week: 3 days    Minutes of Exercise per Session: 30 min  Stress: No Stress Concern Present (01/22/2021)   [place]  Feeling of Stress : Not at all  Social Connections: Moderately Integrated (01/22/2021)   Social Connection and Isolation Panel  [NHANES]    Frequency of Communication with Friends and Family: More than three times a week    Frequency of Social Gatherings with Friends and Family: More than three times a week    Attends Religious Services: More than 4 times per year    Active Member of [employer] or Organizations: No    Attends Music therapist: More than 4 times per year    Marital Status: Widowed  Intimate Partner Violence: Not At Risk (05/28/2022)   Humiliation, Afraid, Rape, and Kick questionnaire    Fear of Current or Ex-Partner: No    Emotionally Abused: No    Physically Abused: No    Sexually Abused: No    Family History: Family History  Problem Relation Age of Onset   Breast cancer Mother        dx in her 79s   Heart disease Mother    Thyroid disease Mother    Lung cancer Father        dx in his 41s   Thyroid disease Maternal Aunt    Thyroid nodules Maternal Grandmother 35       goiter   Thyroid disease Maternal Grandmother    Heart disease Maternal Grandfather    Heart disease Paternal Grandmother    Heart disease Paternal Grandfather    Thyroid disease Daughter    Thyroid disease Daughter    Cancer Maternal Uncle        NOS   Cancer Paternal Uncle        NOS   Breast cancer Cousin        pat first cousin died in her 24s;     Current Medications:  Current Outpatient Medications:    Acetaminophen (TYLENOL PO), Take 1 tablet by mouth as needed., Disp: , Rfl:    amoxicillin-clavulanate (AUGMENTIN) 875-125 MG tablet, Take 1 tablet by mouth 2 (two) times daily., Disp: 14 tablet, Rfl: 0   gabapentin (NEURONTIN) 300 MG capsule, Take 1 capsule by mouth at bedtime., Disp: , Rfl:    hydrALAZINE (APRESOLINE) 25 MG tablet, Take 25 mg by mouth 3 (three) times daily., Disp: , Rfl:    levothyroxine (SYNTHROID) 75 MCG tablet, Take 100 mcg by mouth daily before breakfast., Disp: , Rfl:    Melatonin 5 MG TABS, Take 10 mg by mouth. As needed for sleep, Disp: , Rfl:     olmesartan-hydrochlorothiazide (BENICAR HCT) 40-25 MG tablet, Take 1 tablet by mouth daily., Disp: , Rfl:    pantoprazole (PROTONIX) 40 MG tablet, Take 40 mg by mouth daily., Disp: , Rfl:    predniSONE (DELTASONE) 50 MG tablet, Take one tab po 13 hours prior, then one tab 7 hours prior, and then one tab 1 hour prior to MRI, Disp: 3 tablet, Rfl: 0   Allergies: Allergies  Allergen Reactions   Exforge [Amlodipine Besylate-Valsartan] Nausea And Vomiting   Gadolinium Derivatives Hives, Itching and Anaphylaxis    13hr prep prior to contrast admin  MRI iv contrast   Gadopiclenol Hives and Itching    13 hr prep prior to contrast admin    Tape Other (See Comments)    Redness and Irriatation   Cheese     Hard cheese   Levofloxacin In D5w Hives   Other    Proanthocyanidin    Strawberry Extract Diarrhea    Seeds,nuts, lettuce, grapes   Wild Lettuce  Extract (Lactuca Virosa)    Yeast-Related Products Hives    Mold on bread    REVIEW OF SYSTEMS:   Review of Systems  Constitutional:  Negative for chills, fatigue and fever.  HENT:   Negative for lump/mass, mouth sores, nosebleeds, sore throat and trouble swallowing.   Eyes:  Negative for eye problems.  Respiratory:  Positive for cough and shortness of breath.   Cardiovascular:  Negative for chest pain, leg swelling and palpitations.  Gastrointestinal:  Positive for diarrhea and vomiting. Negative for abdominal pain, constipation and nausea.  Genitourinary:  Negative for bladder incontinence, difficulty urinating, dysuria, frequency, hematuria and nocturia.   Musculoskeletal:  Negative for arthralgias, back pain, flank pain, myalgias and neck pain.  Skin:  Negative for itching and rash.  Neurological:  Positive for headaches and numbness. Negative for dizziness.  Hematological:  Does not bruise/bleed easily.  Psychiatric/Behavioral:  Negative for depression, sleep disturbance and suicidal ideas. The patient is not nervous/anxious.   All  other systems reviewed and are negative.    VITALS:   There were no vitals taken for this visit.  Wt Readings from Last 3 Encounters:  11/30/22 165 lb (74.8 kg)  11/04/22 164 lb 3.2 oz (74.5 kg)  10/22/22 163 lb (73.9 kg)    There is no height or weight on file to calculate BMI.  Performance status (ECOG): 1 - Symptomatic but completely ambulatory  PHYSICAL EXAM:   Physical Exam Vitals and nursing note reviewed. Exam conducted with a chaperone present.  Constitutional:      Appearance: Normal appearance.  Cardiovascular:     Rate and Rhythm: Normal rate and regular rhythm.     Pulses: Normal pulses.     Heart sounds: Normal heart sounds.  Pulmonary:     Effort: Pulmonary effort is normal.     Breath sounds: Normal breath sounds.  Abdominal:     Palpations: Abdomen is soft. There is no hepatomegaly, splenomegaly or mass.     Tenderness: There is no abdominal tenderness.  Musculoskeletal:     Right lower leg: No edema.     Left lower leg: No edema.  Lymphadenopathy:     Cervical: No cervical adenopathy.     Right cervical: No superficial, deep or posterior cervical adenopathy.    Left cervical: No superficial, deep or posterior cervical adenopathy.     Upper Body:     Right upper body: No supraclavicular or axillary adenopathy.     Left upper body: No supraclavicular or axillary adenopathy.  Neurological:     General: No focal deficit present.     Mental Status: She is alert and oriented to person, place, and time.  Psychiatric:        Mood and Affect: Mood normal.        Behavior: Behavior normal.     LABS:      Latest Ref Rng & Units 11/30/2022    9:36 AM 11/04/2022    8:54 AM 10/22/2022    8:12 AM  CBC  WBC 4.0 - 10.5 K/uL 2.6  3.3  2.6   Hemoglobin 12.0 - 15.0 g/dL 10.7  10.4  9.3   Hematocrit 36.0 - 46.0 % 32.6  31.9  29.1   Platelets 150 - 400 K/uL 177  193  160       Latest Ref Rng & Units 11/30/2022    9:36 AM 11/04/2022    8:54 AM 10/22/2022    8:12  AM  CMP  Glucose 70 -  99 mg/dL 99  103  93   BUN 8 - 23 mg/dL 18  22  16    Creatinine 0.44 - 1.00 mg/dL 0.75  0.80  0.58   Sodium 135 - 145 mmol/L 135  135  135   Potassium 3.5 - 5.1 mmol/L 3.9  3.8  3.8   Chloride 98 - 111 mmol/L 103  102  104   CO2 22 - 32 mmol/L 25  25  25    Calcium 8.9 - 10.3 mg/dL 8.5  8.4  8.4   Total Protein 6.5 - 8.1 g/dL 6.9  6.7  6.7   Total Bilirubin 0.3 - 1.2 mg/dL 0.5  0.7  0.2   Alkaline Phos 38 - 126 U/L 110  100  94   AST 15 - 41 U/L 19  21  19    ALT 0 - 44 U/L 13  16  14       No results found for: "CEA1", "CEA" / No results found for: "CEA1", "CEA" No results found for: "PSA1" No results found for: "[id number]" No results found for: "CAN125"  No results found for: "TOTALPROTELP", "ALBUMINELP", "A1GS", "A2GS", "BETS", "BETA2SER", "GAMS", "MSPIKE", "SPEI" No results found for: "TIBC", "FERRITIN", "IRONPCTSAT" No results found for: "LDH"   STUDIES:   CT CHEST ABDOMEN PELVIS W CONTRAST  Result Date: 11/03/2022 CLINICAL DATA:  72 year old female with history of breast cancer with metastatic disease to the bones and brain. * Tracking Code: BO * EXAM: CT CHEST, ABDOMEN, AND PELVIS WITH CONTRAST TECHNIQUE: Multidetector CT imaging of the chest, abdomen and pelvis was performed following the standard protocol during bolus administration of intravenous contrast. RADIATION DOSE REDUCTION: This exam was performed according to the departmental dose-optimization program which includes automated exposure control, adjustment of the mA and/or kV according to patient size and/or use of iterative reconstruction technique. CONTRAST:  34mL OMNIPAQUE IOHEXOL 300 MG/ML  SOLN COMPARISON:  PET-CT 05/28/2022. CT of the abdomen and pelvis 08/24/2022. FINDINGS: CT CHEST FINDINGS Cardiovascular: Heart size is normal. There is no significant pericardial fluid, thickening or pericardial calcification. There is aortic atherosclerosis, as well as atherosclerosis of the great vessels of  the mediastinum and the coronary arteries, including calcified atherosclerotic plaque in the left anterior descending coronary artery. Calcifications of the mitral annulus. Right internal jugular single-lumen Port-A-Cath with tip terminating in the distal superior vena cava. Mediastinum/Nodes: No pathologically enlarged mediastinal, hilar or internal mammary lymph nodes. Esophagus is unremarkable in appearance. No axillary lymphadenopathy. Numerous surgical clips are noted in the left axillary and subpectoral regions from prior lymph node dissection. Lungs/Pleura: Left lower lobe pulmonary nodule measuring 1.2 x 0.9 cm with well-defined macrolobulated margins and some central calcification, stable, previously not hypermetabolic) presumably a benign lesion such as a hamartoma). Multiple other smaller pulmonary nodules are scattered elsewhere throughout the lungs bilaterally, generally similar to the prior PET-CT, most notable for a cluster of small pulmonary nodules in the left lower lobe, largest of which measures 6 x 4 mm (mean diameter 5 mm) on axial image 101 of series 4, nonspecific. No larger more suspicious appearing pulmonary nodules or masses are noted. No acute consolidative airspace disease. No pleural effusions. Mild post infectious scarring in the medial aspect of the right upper lobe and medial segment of the right middle lobe. Musculoskeletal: Status post left modified radical mastectomy. Chronic postoperative fluid collection and/or collapsed breast implant noted in this region without well-defined soft tissue mass. Multiple predominantly sclerotic lesions are again noted in the visualized axial and appendicular  skeleton, compatible with widespread metastatic disease to the bones, most notable for a sclerotic lesion in the left scapula involving portions of the glenoid (axial image 7 of series 2), and a large sclerotic lesion in the T11 vertebral body measuring approximately 2.6 cm in diameter.  Predominantly lytic lesion noted in the posterior aspect of the T8 vertebral body measuring 1.5 cm in diameter (sagittal image 78 of series 6), not readily apparent on prior PET-CT. CT ABDOMEN PELVIS FINDINGS Hepatobiliary: No suspicious cystic or solid hepatic lesions. No intra or extrahepatic biliary ductal dilatation. Gallbladder is not visualized, presumably surgically absent (no surgical clips are noted in the gallbladder fossa). Pancreas: No pancreatic mass. No pancreatic ductal dilatation. No pancreatic or peripancreatic fluid collections or inflammatory changes. Spleen: Unremarkable. Adrenals/Urinary Tract: Bilateral kidneys and bilateral adrenal glands are normal in appearance. No hydroureteronephrosis. Urinary bladder is nearly completely decompressed, the base of the urinary bladder extends below the level of the pubococcygeal line at rest, indicative of a cystocele. Stomach/Bowel: The appearance of the stomach is normal. There is no pathologic dilatation of small bowel or colon. Numerous colonic diverticula are noted, most evident in the sigmoid colon, without surrounding inflammatory changes to suggest an acute diverticulitis at this time. Hypermobile cecum (normal anatomical variant) incidentally noted. The appendix is not confidently identified and may be surgically absent. Regardless, there are no inflammatory changes noted adjacent to the cecum to suggest the presence of an acute appendicitis at this time. Vascular/Lymphatic: Atherosclerotic calcifications in the abdominal aorta and pelvic vasculature, without evidence of aneurysm or dissection. No lymphadenopathy noted in the abdomen or pelvis. Reproductive: Status post hysterectomy. Ovaries are not confidently identified may be surgically absent or atrophic. Other: No significant volume of ascites.  No pneumoperitoneum. Musculoskeletal: Multiple predominantly lytic lesions are noted throughout the visualized axial and appendicular skeleton,  similar to recent CT examination, compatible with widespread metastatic disease to the bones. IMPRESSION: 1. Today's study again demonstrates widespread metastatic disease to the bones. In the thorax, there is a new predominantly lytic lesion in the posterior aspect of the T8 vertebral body which was not readily apparent on prior PET-CT. Findings in the bones visualized in the abdomen and pelvis are similar to recent CT the abdomen and pelvis 08/24/2022. 2. No definite extra skeletal metastatic disease confidently identified in the chest, abdomen or pelvis. 3. There are multiple small pulmonary nodules scattered throughout the lungs bilaterally, grossly stable compared to prior PET-CT. These are nonspecific, but favored to be benign. Continued attention on follow-up studies is recommended. Included among disease is a large partially calcified clearly benign lesion in the left lower lobe, compatible with a benign hamartoma. 4. Colonic diverticulosis without evidence of acute diverticulitis at this time. 5. Aortic atherosclerosis, in addition to left anterior descending coronary artery disease. Assessment for potential risk factor modification, dietary therapy or pharmacologic therapy may be warranted, if clinically indicated. 6. Additional incidental findings, as above. Electronically Signed   By: Vinnie Langton M.D.   On: 11/03/2022 09:03

## 2022-11-30 ENCOUNTER — Inpatient Hospital Stay: Payer: Medicare Other | Attending: Hematology

## 2022-11-30 ENCOUNTER — Inpatient Hospital Stay: Payer: Medicare Other

## 2022-11-30 ENCOUNTER — Inpatient Hospital Stay (HOSPITAL_BASED_OUTPATIENT_CLINIC_OR_DEPARTMENT_OTHER): Payer: Medicare Other | Admitting: Hematology

## 2022-11-30 DIAGNOSIS — Z801 Family history of malignant neoplasm of trachea, bronchus and lung: Secondary | ICD-10-CM | POA: Insufficient documentation

## 2022-11-30 DIAGNOSIS — Z803 Family history of malignant neoplasm of breast: Secondary | ICD-10-CM | POA: Insufficient documentation

## 2022-11-30 DIAGNOSIS — G629 Polyneuropathy, unspecified: Secondary | ICD-10-CM | POA: Diagnosis not present

## 2022-11-30 DIAGNOSIS — C7951 Secondary malignant neoplasm of bone: Secondary | ICD-10-CM | POA: Diagnosis not present

## 2022-11-30 DIAGNOSIS — Z9012 Acquired absence of left breast and nipple: Secondary | ICD-10-CM | POA: Diagnosis not present

## 2022-11-30 DIAGNOSIS — Z95828 Presence of other vascular implants and grafts: Secondary | ICD-10-CM | POA: Diagnosis not present

## 2022-11-30 DIAGNOSIS — E039 Hypothyroidism, unspecified: Secondary | ICD-10-CM | POA: Diagnosis not present

## 2022-11-30 DIAGNOSIS — C7931 Secondary malignant neoplasm of brain: Secondary | ICD-10-CM | POA: Insufficient documentation

## 2022-11-30 DIAGNOSIS — J45909 Unspecified asthma, uncomplicated: Secondary | ICD-10-CM | POA: Diagnosis not present

## 2022-11-30 DIAGNOSIS — I10 Essential (primary) hypertension: Secondary | ICD-10-CM | POA: Insufficient documentation

## 2022-11-30 DIAGNOSIS — I700 Atherosclerosis of aorta: Secondary | ICD-10-CM | POA: Diagnosis not present

## 2022-11-30 DIAGNOSIS — Z5111 Encounter for antineoplastic chemotherapy: Secondary | ICD-10-CM | POA: Insufficient documentation

## 2022-11-30 DIAGNOSIS — C50912 Malignant neoplasm of unspecified site of left female breast: Secondary | ICD-10-CM

## 2022-11-30 DIAGNOSIS — D702 Other drug-induced agranulocytosis: Secondary | ICD-10-CM

## 2022-11-30 DIAGNOSIS — K5730 Diverticulosis of large intestine without perforation or abscess without bleeding: Secondary | ICD-10-CM | POA: Insufficient documentation

## 2022-11-30 DIAGNOSIS — Z171 Estrogen receptor negative status [ER-]: Secondary | ICD-10-CM | POA: Insufficient documentation

## 2022-11-30 LAB — COMPREHENSIVE METABOLIC PANEL
ALT: 13 U/L (ref 0–44)
AST: 19 U/L (ref 15–41)
Albumin: 3.4 g/dL — ABNORMAL LOW (ref 3.5–5.0)
Alkaline Phosphatase: 110 U/L (ref 38–126)
Anion gap: 7 (ref 5–15)
BUN: 18 mg/dL (ref 8–23)
CO2: 25 mmol/L (ref 22–32)
Calcium: 8.5 mg/dL — ABNORMAL LOW (ref 8.9–10.3)
Chloride: 103 mmol/L (ref 98–111)
Creatinine, Ser: 0.75 mg/dL (ref 0.44–1.00)
GFR, Estimated: >60 mL/min (ref >60)
Glucose, Bld: 99 mg/dL (ref 70–99)
Potassium: 3.9 mmol/L (ref 3.5–5.1)
Sodium: 135 mmol/L (ref 135–145)
Total Bilirubin: 0.5 mg/dL (ref 0.3–1.2)
Total Protein: 6.9 g/dL (ref 6.5–8.1)

## 2022-11-30 LAB — CBC WITH DIFFERENTIAL/PLATELET
Abs Immature Granulocytes: 0 10*3/uL (ref 0.00–0.07)
Basophils Absolute: 0 10*3/uL (ref 0.0–0.1)
Basophils Relative: 0 %
Eosinophils Absolute: 0.8 10*3/uL — ABNORMAL HIGH (ref 0.0–0.5)
Eosinophils Relative: 31 %
HCT: 32.6 % — ABNORMAL LOW (ref 36.0–46.0)
Hemoglobin: 10.7 g/dL — ABNORMAL LOW (ref 12.0–15.0)
Lymphocytes Relative: 38 %
Lymphs Abs: 1 10*3/uL (ref 0.7–4.0)
MCH: 34 pg (ref 26.0–34.0)
MCHC: 32.8 g/dL (ref 30.0–36.0)
MCV: 103.5 fL — ABNORMAL HIGH (ref 80.0–100.0)
Monocytes Absolute: 0.1 10*3/uL (ref 0.1–1.0)
Monocytes Relative: 5 %
Neutro Abs: 0.7 10*3/uL — ABNORMAL LOW (ref 1.7–7.7)
Neutrophils Relative %: 26 %
Platelets: 177 10*3/uL (ref 150–400)
RBC: 3.15 MIL/uL — ABNORMAL LOW (ref 3.87–5.11)
RDW: 13.5 % (ref 11.5–15.5)
WBC: 2.6 10*3/uL — ABNORMAL LOW (ref 4.0–10.5)
nRBC: 0 % (ref 0.0–0.2)

## 2022-11-30 LAB — MAGNESIUM: Magnesium: 1.8 mg/dL (ref 1.7–2.4)

## 2022-11-30 MED ORDER — AMOXICILLIN-POT CLAVULANATE 875-125 MG PO TABS: 1.0000 | ORAL_TABLET | Freq: Two times a day (BID) | ORAL | 0 refills | Status: DC

## 2022-11-30 MED ORDER — SODIUM CHLORIDE 0.9% FLUSH
10.0000 mL | Freq: Once | INTRAVENOUS | Status: AC
  Administered 2022-11-30: 10 mL via INTRAVENOUS

## 2022-11-30 MED ORDER — HEPARIN SOD (PORK) LOCK FLUSH 100 UNIT/ML IV SOLN
500.0000 [IU] | Freq: Once | INTRAVENOUS | Status: AC
  Administered 2022-11-30: 500 [IU] via INTRAVENOUS

## 2022-11-30 MED ORDER — FILGRASTIM-SNDZ 480 MCG/0.8ML IJ SOSY
480.0000 ug | PREFILLED_SYRINGE | Freq: Once | INTRAMUSCULAR | Status: AC
  Administered 2022-11-30: 480 ug via SUBCUTANEOUS
  Filled 2022-11-30: qty 0.8

## 2022-11-30 NOTE — Progress Notes (Signed)
Per Dr. Delton Coombes, No treatment today due to [facility] of 0.7.  Patient to receive Zarxio injection and come back tomorrow for [facility] flush labs and possible treatment.  Stable during administration without incident; injection site WNL; see MAR for injection details.  Patient tolerated procedure well and without incident.  No questions or complaints noted at this time.

## 2022-11-30 NOTE — Patient Instructions (Addendum)
[place] at [facility] Discharge Instructions   You were seen and examined today by Dr. Delton Coombes.  He reviewed the results of your lab work which are mostly normal/stable.   We will hold your treatment today due to your low white blood cell count. We will give you a white blood cell booster shot today. We will recheck your lab work tomorrow and will proceed with treatment if your white blood cells are high enough.    We will order MRI of your thoracic and lumbar spine to investigate the pain and weakness of your right leg.  Increase the gabapentin to 600 mg at bedtime to see if this helps with the right leg pain.   Return as scheduled.    Thank you for choosing [place] at [facility] to provide your oncology and hematology care.  To afford each patient quality time with our provider, please arrive at least 15 minutes before your scheduled appointment time.   If you have a lab appointment with the [facility] please come in thru the Main Entrance and check in at the main information desk.  You need to re-schedule your appointment should you arrive 10 or more minutes late.  We strive to give you quality time with our providers, and arriving late affects you and other patients whose appointments are after yours.  Also, if you no show three or more times for appointments you may be dismissed from the clinic at the providers discretion.     Again, thank you for choosing [facility].  Our hope is that these requests will decrease the amount of time that you wait before being seen by our physicians.       _____________________________________________________________  Should you have questions after your visit to [facility], please contact our office at [phone] and follow the prompts.  Our office hours are 8:00 a.m. and 4:30 p.m. Monday - Friday.  Please note that voicemails left after 4:00 p.m. may not be  returned until the following business day.  We are closed weekends and major holidays.  You do have access to a nurse 24-7, just call the main number to the clinic [phone] and do not press any options, hold on the line and a nurse will answer the phone.    For prescription refill requests, have your pharmacy contact our office and allow 72 hours.    Due to Covid, you will need to wear a mask upon entering the hospital. If you do not have a mask, a mask will be given to you at the Main Entrance upon arrival. For doctor visits, patients may have 1 support person age 35 or older with them. For treatment visits, patients can not have anyone with them due to social distancing guidelines and our immunocompromised population.

## 2022-11-30 NOTE — Patient Instructions (Signed)
[facility]  Discharge Instructions: Thank you for choosing [facility] to provide your oncology and hematology care.  If you have a lab appointment with the Cancer Center, please come in thru the Main Entrance and check in at the main information desk.  Wear comfortable clothing and clothing appropriate for easy access to any Portacath or PICC line.   We strive to give you quality time with your provider. You may need to reschedule your appointment if you arrive late (15 or more minutes).  Arriving late affects you and other patients whose appointments are after yours.  Also, if you miss three or more appointments without notifying the office, you may be dismissed from the clinic at the provider's discretion.      For prescription refill requests, have your pharmacy contact our office and allow 72 hours for refills to be completed.    Today you received the following chemotherapy and/or immunotherapy agents zarxio      To help prevent nausea and vomiting after your treatment, we encourage you to take your nausea medication as directed.  BELOW ARE SYMPTOMS THAT SHOULD BE REPORTED IMMEDIATELY: *FEVER GREATER THAN 100.4 F (38 C) OR HIGHER *CHILLS OR SWEATING *NAUSEA AND VOMITING THAT IS NOT CONTROLLED WITH YOUR NAUSEA MEDICATION *UNUSUAL SHORTNESS OF BREATH *UNUSUAL BRUISING OR BLEEDING *URINARY PROBLEMS (pain or burning when urinating, or frequent urination) *BOWEL PROBLEMS (unusual diarrhea, constipation, pain near the anus) TENDERNESS IN MOUTH AND THROAT WITH OR WITHOUT PRESENCE OF ULCERS (sore throat, sores in mouth, or a toothache) UNUSUAL RASH, SWELLING OR PAIN  UNUSUAL VAGINAL DISCHARGE OR ITCHING   Items with * indicate a potential emergency and should be followed up as soon as possible or go to the Emergency Department if any problems should occur.  Please show the CHEMOTHERAPY ALERT CARD or IMMUNOTHERAPY ALERT CARD at check-in to the Emergency  Department and triage nurse.  Should you have questions after your visit or need to cancel or reschedule your appointment, please contact [facility] [phone]  and follow the prompts.  Office hours are 8:00 a.m. to 4:30 p.m. Monday - Friday. Please note that voicemails left after 4:00 p.m. may not be returned until the following business day.  We are closed weekends and major holidays. You have access to a nurse at all times for urgent questions. Please call the main number to the clinic [phone] and follow the prompts.  For any non-urgent questions, you may also contact your provider using MyChart. We now offer e-Visits for anyone 18 and older to request care online for non-urgent symptoms. For details visit [email or web address].   Also download the MyChart app! Go to the app store, search "MyChart", open the app, select [facility], and log in with your MyChart username and password.   

## 2022-12-01 ENCOUNTER — Inpatient Hospital Stay: Payer: Medicare Other

## 2022-12-01 VITALS — BP 112/63 | HR 67 | Temp 97.7°F | Resp 17

## 2022-12-01 DIAGNOSIS — Z5111 Encounter for antineoplastic chemotherapy: Secondary | ICD-10-CM | POA: Diagnosis not present

## 2022-12-01 DIAGNOSIS — G629 Polyneuropathy, unspecified: Secondary | ICD-10-CM | POA: Diagnosis not present

## 2022-12-01 DIAGNOSIS — C7931 Secondary malignant neoplasm of brain: Secondary | ICD-10-CM | POA: Diagnosis not present

## 2022-12-01 DIAGNOSIS — C7951 Secondary malignant neoplasm of bone: Secondary | ICD-10-CM | POA: Diagnosis not present

## 2022-12-01 DIAGNOSIS — Z171 Estrogen receptor negative status [ER-]: Secondary | ICD-10-CM | POA: Diagnosis not present

## 2022-12-01 DIAGNOSIS — Z95828 Presence of other vascular implants and grafts: Secondary | ICD-10-CM

## 2022-12-01 DIAGNOSIS — C50912 Malignant neoplasm of unspecified site of left female breast: Secondary | ICD-10-CM

## 2022-12-01 LAB — CBC WITH DIFFERENTIAL/PLATELET
Abs Immature Granulocytes: 0.1 10*3/uL — ABNORMAL HIGH (ref 0.00–0.07)
Basophils Absolute: 0 10*3/uL (ref 0.0–0.1)
Basophils Relative: 0 %
Eosinophils Absolute: 0.8 10*3/uL — ABNORMAL HIGH (ref 0.0–0.5)
Eosinophils Relative: 6 %
HCT: 33.9 % — ABNORMAL LOW (ref 36.0–46.0)
Hemoglobin: 11.1 g/dL — ABNORMAL LOW (ref 12.0–15.0)
Immature Granulocytes: 1 %
Lymphocytes Relative: 13 %
Lymphs Abs: 1.6 10*3/uL (ref 0.7–4.0)
MCH: 34.2 pg — ABNORMAL HIGH (ref 26.0–34.0)
MCHC: 32.7 g/dL (ref 30.0–36.0)
MCV: 104.3 fL — ABNORMAL HIGH (ref 80.0–100.0)
Monocytes Absolute: 0.8 10*3/uL (ref 0.1–1.0)
Monocytes Relative: 6 %
Neutro Abs: 9 10*3/uL — ABNORMAL HIGH (ref 1.7–7.7)
Neutrophils Relative %: 74 %
Platelets: 194 10*3/uL (ref 150–400)
RBC: 3.25 MIL/uL — ABNORMAL LOW (ref 3.87–5.11)
RDW: 13.6 % (ref 11.5–15.5)
WBC: 12.3 10*3/uL — ABNORMAL HIGH (ref 4.0–10.5)
nRBC: 0 % (ref 0.0–0.2)

## 2022-12-01 MED ORDER — HEPARIN SOD (PORK) LOCK FLUSH 100 UNIT/ML IV SOLN: 500.0000 [IU] | Freq: Once | INTRAVENOUS | Status: DC | PRN

## 2022-12-01 MED ORDER — FAM-TRASTUZUMAB DERUXTECAN-NXKI CHEMO 100 MG IV SOLR
4.4000 mg/kg | Freq: Once | INTRAVENOUS | Status: AC
  Administered 2022-12-01: 300 mg via INTRAVENOUS
  Filled 2022-12-01: qty 15

## 2022-12-01 MED ORDER — SODIUM CHLORIDE 0.9% FLUSH
10.0000 mL | Freq: Once | INTRAVENOUS | Status: AC
  Administered 2022-12-01: 10 mL via INTRAVENOUS

## 2022-12-01 MED ORDER — DEXTROSE 5 % IV SOLN: Freq: Once | INTRAVENOUS | Status: AC

## 2022-12-01 MED ORDER — CETIRIZINE HCL 10 MG PO TABS: 10.0000 mg | ORAL_TABLET | Freq: Once | ORAL | Status: DC

## 2022-12-01 MED ORDER — PALONOSETRON HCL INJECTION 0.25 MG/5ML
0.2500 mg | Freq: Once | INTRAVENOUS | Status: AC
  Administered 2022-12-01: 0.25 mg via INTRAVENOUS
  Filled 2022-12-01: qty 5

## 2022-12-01 MED ORDER — ACETAMINOPHEN 325 MG PO TABS
650.0000 mg | ORAL_TABLET | Freq: Once | ORAL | Status: AC
  Administered 2022-12-01: 650 mg via ORAL
  Filled 2022-12-01: qty 2

## 2022-12-01 MED ORDER — SODIUM CHLORIDE 0.9% FLUSH: 10.0000 mL | INTRAVENOUS | Status: DC | PRN

## 2022-12-01 MED ORDER — CETIRIZINE HCL 10 MG/ML IV SOLN
10.0000 mg | Freq: Once | INTRAVENOUS | Status: AC
  Administered 2022-12-01: 10 mg via INTRAVENOUS
  Filled 2022-12-01: qty 1

## 2022-12-01 MED ORDER — SODIUM CHLORIDE 0.9 % IV SOLN
150.0000 mg | Freq: Once | INTRAVENOUS | Status: AC
  Administered 2022-12-01: 150 mg via INTRAVENOUS
  Filled 2022-12-01: qty 150

## 2022-12-01 MED ORDER — SODIUM CHLORIDE 0.9 % IV SOLN
10.0000 mg | Freq: Once | INTRAVENOUS | Status: AC
  Administered 2022-12-01: 10 mg via INTRAVENOUS
  Filled 2022-12-01: qty 10

## 2022-12-01 NOTE — Progress Notes (Signed)
Orders received to add pegfilgrastim 6 mg subq to treatment plan post Enhertu infusions due to neutropenia.  Fylnetra (pegfilgrastim-pbbk) 6 mg subq x 1 on day 3 added to plan.  Dose rounding in place for Enhertu with today's weight maintain dose at 300 mg.  T.O. Dr Rhys Martini, PharmD

## 2022-12-01 NOTE — Patient Instructions (Signed)
[facility]  Discharge Instructions: Thank you for choosing [facility] to provide your oncology and hematology care.  If you have a lab appointment with the Cancer Center, please come in thru the Main Entrance and check in at the main information desk.  Wear comfortable clothing and clothing appropriate for easy access to any Portacath or PICC line.   We strive to give you quality time with your provider. You may need to reschedule your appointment if you arrive late (15 or more minutes).  Arriving late affects you and other patients whose appointments are after yours.  Also, if you miss three or more appointments without notifying the office, you may be dismissed from the clinic at the provider's discretion.      For prescription refill requests, have your pharmacy contact our office and allow 72 hours for refills to be completed.    Today you received the following chemotherapy and/or immunotherapy agents Enhertu.  Fam-Trastuzumab Deruxtecan Injection What is this medication? FAM-TRASTUZUMAB DERUXTECAN (fam-tras TOOZ eu mab DER ux TEE kan) treats some types of cancer. It works by blocking a protein that causes cancer cells to grow and multiply. This helps to slow or stop the spread of cancer cells. This medicine may be used for other purposes; ask your health care provider or pharmacist if you have questions. COMMON BRAND NAME(S): ENHERTU What should I tell my care team before I take this medication? They need to know if you have any of these conditions: Heart disease Heart failure Infection, especially a viral infection, such as chickenpox, cold sores, or herpes Liver disease Lung or breathing disease, such as asthma or COPD An unusual or allergic reaction to fam-trastuzumab deruxtecan, other medications, foods, dyes, or preservatives Pregnant or trying to get pregnant Breast-feeding How should I use this medication? This medication is injected into  a vein. It is given by your care team in a hospital or clinic setting. A special MedGuide will be given to you before each treatment. Be sure to read this information carefully each time. Talk to your care team about the use of this medication in children. Special care may be needed. Overdosage: If you think you have taken too much of this medicine contact a poison control center or emergency room at once. NOTE: This medicine is only for you. Do not share this medicine with others. What if I miss a dose? It is important not to miss your dose. Call your care team if you are unable to keep an appointment. What may interact with this medication? Interactions are not expected. This list may not describe all possible interactions. Give your health care provider a list of all the medicines, herbs, non-prescription drugs, or dietary supplements you use. Also tell them if you smoke, drink alcohol, or use illegal drugs. Some items may interact with your medicine. What should I watch for while using this medication? Visit your care team for regular checks on your progress. Tell your care team if your symptoms do not start to get better or if they get worse. Your condition will be monitored carefully while you are receiving this medication. Do not become pregnant while taking this medication or for 7 months after stopping it. Women should inform their care team if they wish to become pregnant or think they might be pregnant. Men should not father a child while taking this medication and for 4 months after stopping it. There is potential for serious side effects to an unborn   child. Talk to your care team for more information. Do not breast-feed an infant while taking this medication or for 7 months after the last dose. This medication has caused decreased sperm counts in some men. This may make it more difficult to father a child. Talk to your care team if you are concerned about your fertility. This medication  may increase your risk to bruise or bleed. Call your care team if you notice any unusual bleeding. Be careful brushing or flossing your teeth or using a toothpick because you may get an infection or bleed more easily. If you have any dental work done, tell your dentist you are receiving this medication. This medication may cause dry eyes and blurred vision. If you wear contact lenses, you may feel some discomfort. Lubricating eye drops may help. See your care team if the problem does not go away or is severe. This medication may increase your risk of getting an infection. Call your care team for advice if you get a fever, chills, sore throat, or other symptoms of a cold or flu. Do not treat yourself. Try to avoid being around people who are sick. Avoid taking medications that contain aspirin, acetaminophen, ibuprofen, naproxen, or ketoprofen unless instructed by your care team. These medications may hide a fever. What side effects may I notice from receiving this medication? Side effects that you should report to your care team as soon as possible: Allergic reactions--skin rash, itching, hives, swelling of the face, lips, tongue, or throat Dry cough, shortness of breath or trouble breathing Infection--fever, chills, cough, sore throat, wounds that don't heal, pain or trouble when passing urine, general feeling of discomfort or being unwell Heart failure--shortness of breath, swelling of the ankles, feet, or hands, sudden weight gain, unusual weakness or fatigue Unusual bruising or bleeding Side effects that usually do not require medical attention (report these to your care team if they continue or are bothersome): Constipation Diarrhea Hair loss Muscle pain Nausea Vomiting This list may not describe all possible side effects. Call your doctor for medical advice about side effects. You may report side effects to FDA at 1-800-FDA-1088. Where should I keep my medication? This medication is given  in a hospital or clinic. It will not be stored at home. NOTE: This sheet is a summary. It may not cover all possible information. If you have questions about this medicine, talk to your doctor, pharmacist, or health care provider.  2023 Elsevier/Gold Standard (2021-05-14 00:00:00)        To help prevent nausea and vomiting after your treatment, we encourage you to take your nausea medication as directed.  BELOW ARE SYMPTOMS THAT SHOULD BE REPORTED IMMEDIATELY: *FEVER GREATER THAN 100.4 F (38 C) OR HIGHER *CHILLS OR SWEATING *NAUSEA AND VOMITING THAT IS NOT CONTROLLED WITH YOUR NAUSEA MEDICATION *UNUSUAL SHORTNESS OF BREATH *UNUSUAL BRUISING OR BLEEDING *URINARY PROBLEMS (pain or burning when urinating, or frequent urination) *BOWEL PROBLEMS (unusual diarrhea, constipation, pain near the anus) TENDERNESS IN MOUTH AND THROAT WITH OR WITHOUT PRESENCE OF ULCERS (sore throat, sores in mouth, or a toothache) UNUSUAL RASH, SWELLING OR PAIN  UNUSUAL VAGINAL DISCHARGE OR ITCHING   Items with * indicate a potential emergency and should be followed up as soon as possible or go to the Emergency Department if any problems should occur.  Please show the CHEMOTHERAPY ALERT CARD or IMMUNOTHERAPY ALERT CARD at check-in to the Emergency Department and triage nurse.  Should you have questions after your visit or need to cancel   or reschedule your appointment, please contact [facility] [phone]  and follow the prompts.  Office hours are 8:00 a.m. to 4:30 p.m. Monday - Friday. Please note that voicemails left after 4:00 p.m. may not be returned until the following business day.  We are closed weekends and major holidays. You have access to a nurse at all times for urgent questions. Please call the main number to the clinic [phone] and follow the prompts.  For any non-urgent questions, you may also contact your provider using MyChart. We now offer e-Visits for anyone 18 and older  to request care online for non-urgent symptoms. For details visit [email or web address].   Also download the MyChart app! Go to the app store, search "MyChart", open the app, select [facility], and log in with your MyChart username and password.   

## 2022-12-01 NOTE — Progress Notes (Signed)
Patient presents today for chemotherapy, Enhertu, today. Patient in satisfactory condition with no new complaints voiced. Vital signs are stable. All labs within treatment parameters. We will proceed with treatment per MD orders.   Patient tolerated treatment well with no complaints voiced.  Patient left ambulatory in stable condition.  Vital signs stable at discharge.  Follow up as scheduled.

## 2022-12-03 ENCOUNTER — Inpatient Hospital Stay: Payer: Medicare Other

## 2022-12-03 ENCOUNTER — Other Ambulatory Visit: Payer: Self-pay | Admitting: Radiation Oncology

## 2022-12-03 VITALS — BP 136/84 | HR 68 | Temp 98.0°F | Resp 18

## 2022-12-03 DIAGNOSIS — C7951 Secondary malignant neoplasm of bone: Secondary | ICD-10-CM | POA: Diagnosis not present

## 2022-12-03 DIAGNOSIS — C50912 Malignant neoplasm of unspecified site of left female breast: Secondary | ICD-10-CM

## 2022-12-03 DIAGNOSIS — G629 Polyneuropathy, unspecified: Secondary | ICD-10-CM | POA: Diagnosis not present

## 2022-12-03 DIAGNOSIS — Z171 Estrogen receptor negative status [ER-]: Secondary | ICD-10-CM | POA: Diagnosis not present

## 2022-12-03 DIAGNOSIS — C7931 Secondary malignant neoplasm of brain: Secondary | ICD-10-CM | POA: Diagnosis not present

## 2022-12-03 DIAGNOSIS — Z5111 Encounter for antineoplastic chemotherapy: Secondary | ICD-10-CM | POA: Diagnosis not present

## 2022-12-03 DIAGNOSIS — Z95828 Presence of other vascular implants and grafts: Secondary | ICD-10-CM

## 2022-12-03 MED ORDER — PREDNISONE 50 MG PO TABS: ORAL_TABLET | ORAL | 0 refills | Status: DC

## 2022-12-03 MED ORDER — PEGFILGRASTIM-PBBK 6 MG/0.6ML ~~LOC~~ SOSY
6.0000 mg | PREFILLED_SYRINGE | Freq: Once | SUBCUTANEOUS | Status: AC
  Administered 2022-12-03: 6 mg via SUBCUTANEOUS
  Filled 2022-12-03: qty 0.6

## 2022-12-03 NOTE — Patient Instructions (Signed)
[facility]  Discharge Instructions: Thank you for choosing [facility] to provide your oncology and hematology care.  If you have a lab appointment with the [facility], please come in thru the Main Entrance and check in at the main information desk.  Wear comfortable clothing and clothing appropriate for easy access to any Portacath or PICC line.   We strive to give you quality time with your provider. You may need to reschedule your appointment if you arrive late (15 or more minutes).  Arriving late affects you and other patients whose appointments are after yours.  Also, if you miss three or more appointments without notifying the office, you may be dismissed from the clinic at the provider's discretion.      For prescription refill requests, have your pharmacy contact our office and allow 72 hours for refills to be completed.    Today you received the following Fylnetra, return as scheduled.   To help prevent nausea and vomiting after your treatment, we encourage you to take your nausea medication as directed.  BELOW ARE SYMPTOMS THAT SHOULD BE REPORTED IMMEDIATELY: *FEVER GREATER THAN 100.4 F (38 C) OR HIGHER *CHILLS OR SWEATING *NAUSEA AND VOMITING THAT IS NOT CONTROLLED WITH YOUR NAUSEA MEDICATION *UNUSUAL SHORTNESS OF BREATH *UNUSUAL BRUISING OR BLEEDING *URINARY PROBLEMS (pain or burning when urinating, or frequent urination) *BOWEL PROBLEMS (unusual diarrhea, constipation, pain near the anus) TENDERNESS IN MOUTH AND THROAT WITH OR WITHOUT PRESENCE OF ULCERS (sore throat, sores in mouth, or a toothache) UNUSUAL RASH, SWELLING OR PAIN  UNUSUAL VAGINAL DISCHARGE OR ITCHING   Items with * indicate a potential emergency and should be followed up as soon as possible or go to the Emergency Department if any problems should occur.  Please show the CHEMOTHERAPY ALERT CARD or IMMUNOTHERAPY ALERT CARD at check-in to the Emergency Department and  triage nurse.  Should you have questions after your visit or need to cancel or reschedule your appointment, please contact [facility] [phone]  and follow the prompts.  Office hours are 8:00 a.m. to 4:30 p.m. Monday - Friday. Please note that voicemails left after 4:00 p.m. may not be returned until the following business day.  We are closed weekends and major holidays. You have access to a nurse at all times for urgent questions. Please call the main number to the clinic [phone] and follow the prompts.  For any non-urgent questions, you may also contact your provider using MyChart. We now offer e-Visits for anyone 47 and older to request care online for non-urgent symptoms. For details visit [email or web address].   Also download the MyChart app! Go to the app store, search "MyChart", open the app, select [facility], and log in with your MyChart username and password.

## 2022-12-03 NOTE — Progress Notes (Signed)
Patient tolerated injection with no complaints voiced. Site clean and dry with no bruising or swelling noted at site. See MAR for details. Band aid applied.  Patient stable during and after injection. VSS with discharge and left in satisfactory condition with no s/s of distress noted.  

## 2022-12-05 ENCOUNTER — Ambulatory Visit (HOSPITAL_COMMUNITY)
Admission: RE | Admit: 2022-12-05 | Discharge: 2022-12-05 | Disposition: A | Discharge status: Home / Self Care | Payer: Medicare Other | Source: Ambulatory Visit | Attending: Hematology | Admitting: Hematology

## 2022-12-05 DIAGNOSIS — M79604 Pain in right leg: Secondary | ICD-10-CM | POA: Diagnosis not present

## 2022-12-05 DIAGNOSIS — M545 Low back pain, unspecified: Secondary | ICD-10-CM | POA: Diagnosis not present

## 2022-12-05 DIAGNOSIS — C50912 Malignant neoplasm of unspecified site of left female breast: Secondary | ICD-10-CM

## 2022-12-05 DIAGNOSIS — C7951 Secondary malignant neoplasm of bone: Secondary | ICD-10-CM | POA: Diagnosis not present

## 2022-12-05 MED ORDER — GADOBUTROL 1 MMOL/ML IV SOLN
7.5000 mL | Freq: Once | INTRAVENOUS | Status: AC | PRN
  Administered 2022-12-05: 7.5 mL via INTRAVENOUS

## 2022-12-15 ENCOUNTER — Other Ambulatory Visit: Payer: Self-pay

## 2022-12-21 ENCOUNTER — Inpatient Hospital Stay (HOSPITAL_BASED_OUTPATIENT_CLINIC_OR_DEPARTMENT_OTHER): Payer: Medicare Other | Admitting: Hematology

## 2022-12-21 ENCOUNTER — Inpatient Hospital Stay: Payer: Medicare Other

## 2022-12-21 ENCOUNTER — Inpatient Hospital Stay: Payer: Medicare Other | Attending: Hematology

## 2022-12-21 VITALS — BP 136/85 | HR 60 | Temp 99.1°F | Resp 18

## 2022-12-21 DIAGNOSIS — I10 Essential (primary) hypertension: Secondary | ICD-10-CM | POA: Diagnosis not present

## 2022-12-21 DIAGNOSIS — C7951 Secondary malignant neoplasm of bone: Secondary | ICD-10-CM | POA: Diagnosis not present

## 2022-12-21 DIAGNOSIS — R911 Solitary pulmonary nodule: Secondary | ICD-10-CM | POA: Diagnosis not present

## 2022-12-21 DIAGNOSIS — Z5111 Encounter for antineoplastic chemotherapy: Secondary | ICD-10-CM | POA: Insufficient documentation

## 2022-12-21 DIAGNOSIS — Z79890 Hormone replacement therapy (postmenopausal): Secondary | ICD-10-CM | POA: Diagnosis not present

## 2022-12-21 DIAGNOSIS — Z803 Family history of malignant neoplasm of breast: Secondary | ICD-10-CM | POA: Diagnosis not present

## 2022-12-21 DIAGNOSIS — E039 Hypothyroidism, unspecified: Secondary | ICD-10-CM | POA: Diagnosis not present

## 2022-12-21 DIAGNOSIS — Z171 Estrogen receptor negative status [ER-]: Secondary | ICD-10-CM | POA: Diagnosis not present

## 2022-12-21 DIAGNOSIS — Z5189 Encounter for other specified aftercare: Secondary | ICD-10-CM | POA: Diagnosis not present

## 2022-12-21 DIAGNOSIS — Z95828 Presence of other vascular implants and grafts: Secondary | ICD-10-CM

## 2022-12-21 DIAGNOSIS — J45909 Unspecified asthma, uncomplicated: Secondary | ICD-10-CM | POA: Diagnosis not present

## 2022-12-21 DIAGNOSIS — C50912 Malignant neoplasm of unspecified site of left female breast: Secondary | ICD-10-CM | POA: Diagnosis not present

## 2022-12-21 DIAGNOSIS — D72819 Decreased white blood cell count, unspecified: Secondary | ICD-10-CM | POA: Diagnosis not present

## 2022-12-21 DIAGNOSIS — Z801 Family history of malignant neoplasm of trachea, bronchus and lung: Secondary | ICD-10-CM | POA: Insufficient documentation

## 2022-12-21 DIAGNOSIS — G629 Polyneuropathy, unspecified: Secondary | ICD-10-CM | POA: Insufficient documentation

## 2022-12-21 LAB — COMPREHENSIVE METABOLIC PANEL
ALT: 12 U/L (ref 0–44)
AST: 14 U/L — ABNORMAL LOW (ref 15–41)
Albumin: 3.3 g/dL — ABNORMAL LOW (ref 3.5–5.0)
Alkaline Phosphatase: 114 U/L (ref 38–126)
Anion gap: 7 (ref 5–15)
BUN: 15 mg/dL (ref 8–23)
CO2: 25 mmol/L (ref 22–32)
Calcium: 8.7 mg/dL — ABNORMAL LOW (ref 8.9–10.3)
Chloride: 102 mmol/L (ref 98–111)
Creatinine, Ser: 0.77 mg/dL (ref 0.44–1.00)
GFR, Estimated: >60 mL/min (ref >60)
Glucose, Bld: 93 mg/dL (ref 70–99)
Potassium: 3.8 mmol/L (ref 3.5–5.1)
Sodium: 134 mmol/L — ABNORMAL LOW (ref 135–145)
Total Bilirubin: 0.3 mg/dL (ref 0.3–1.2)
Total Protein: 6.6 g/dL (ref 6.5–8.1)

## 2022-12-21 LAB — CBC WITH DIFFERENTIAL/PLATELET
Abs Immature Granulocytes: 0.01 10*3/uL (ref 0.00–0.07)
Basophils Absolute: 0 10*3/uL (ref 0.0–0.1)
Basophils Relative: 1 %
Eosinophils Absolute: 0.6 10*3/uL — ABNORMAL HIGH (ref 0.0–0.5)
Eosinophils Relative: 16 %
HCT: 31.2 % — ABNORMAL LOW (ref 36.0–46.0)
Hemoglobin: 10.4 g/dL — ABNORMAL LOW (ref 12.0–15.0)
Immature Granulocytes: 0 %
Lymphocytes Relative: 28 %
Lymphs Abs: 1 10*3/uL (ref 0.7–4.0)
MCH: 34.4 pg — ABNORMAL HIGH (ref 26.0–34.0)
MCHC: 33.3 g/dL (ref 30.0–36.0)
MCV: 103.3 fL — ABNORMAL HIGH (ref 80.0–100.0)
Monocytes Absolute: 0.4 10*3/uL (ref 0.1–1.0)
Monocytes Relative: 11 %
Neutro Abs: 1.6 10*3/uL — ABNORMAL LOW (ref 1.7–7.7)
Neutrophils Relative %: 44 %
Platelets: 214 10*3/uL (ref 150–400)
RBC: 3.02 MIL/uL — ABNORMAL LOW (ref 3.87–5.11)
RDW: 14 % (ref 11.5–15.5)
WBC: 3.6 10*3/uL — ABNORMAL LOW (ref 4.0–10.5)
nRBC: 0 % (ref 0.0–0.2)

## 2022-12-21 LAB — MAGNESIUM: Magnesium: 1.8 mg/dL (ref 1.7–2.4)

## 2022-12-21 MED ORDER — SODIUM CHLORIDE 0.9% FLUSH
10.0000 mL | Freq: Once | INTRAVENOUS | Status: AC
  Administered 2022-12-21: 10 mL via INTRAVENOUS

## 2022-12-21 MED ORDER — SODIUM CHLORIDE 0.9 % IV SOLN
150.0000 mg | Freq: Once | INTRAVENOUS | Status: AC
  Administered 2022-12-21: 150 mg via INTRAVENOUS
  Filled 2022-12-21: qty 5

## 2022-12-21 MED ORDER — PALONOSETRON HCL INJECTION 0.25 MG/5ML
0.2500 mg | Freq: Once | INTRAVENOUS | Status: AC
  Administered 2022-12-21: 0.25 mg via INTRAVENOUS
  Filled 2022-12-21: qty 5

## 2022-12-21 MED ORDER — ACETAMINOPHEN 325 MG PO TABS
650.0000 mg | ORAL_TABLET | Freq: Once | ORAL | Status: AC
  Administered 2022-12-21: 650 mg via ORAL
  Filled 2022-12-21: qty 2

## 2022-12-21 MED ORDER — SODIUM CHLORIDE 0.9 % IV SOLN
10.0000 mg | Freq: Once | INTRAVENOUS | Status: AC
  Administered 2022-12-21: 10 mg via INTRAVENOUS
  Filled 2022-12-21: qty 1

## 2022-12-21 MED ORDER — DEXTROSE 5 % IV SOLN: Freq: Once | INTRAVENOUS | Status: AC

## 2022-12-21 MED ORDER — SODIUM CHLORIDE 0.9% FLUSH
10.0000 mL | INTRAVENOUS | Status: DC | PRN
  Administered 2022-12-21: 10 mL

## 2022-12-21 MED ORDER — FAM-TRASTUZUMAB DERUXTECAN-NXKI CHEMO 100 MG IV SOLR
4.4000 mg/kg | Freq: Once | INTRAVENOUS | Status: AC
  Administered 2022-12-21: 300 mg via INTRAVENOUS
  Filled 2022-12-21: qty 15

## 2022-12-21 MED ORDER — CETIRIZINE HCL 10 MG/ML IV SOLN
10.0000 mg | Freq: Once | INTRAVENOUS | Status: AC
  Administered 2022-12-21: 10 mg via INTRAVENOUS
  Filled 2022-12-21: qty 1

## 2022-12-21 MED ORDER — HEPARIN SOD (PORK) LOCK FLUSH 100 UNIT/ML IV SOLN
500.0000 [IU] | Freq: Once | INTRAVENOUS | Status: AC | PRN
  Administered 2022-12-21: 500 [IU]

## 2022-12-21 NOTE — Progress Notes (Signed)
Maintain dose at 300 mg despite slight weight increase.  T.O. Dr Carilyn Goodpasture, PharmD

## 2022-12-21 NOTE — Patient Instructions (Signed)
[facility]  Discharge Instructions: Thank you for choosing [facility] to provide your oncology and hematology care.  If you have a lab appointment with the Cancer Center - please note that after April 8th, 2024, all labs will be drawn in the cancer center.  You do not have to check in or register with the main entrance as you have in the past but will complete your check-in in the cancer center.  Wear comfortable clothing and clothing appropriate for easy access to any Portacath or PICC line.   We strive to give you quality time with your provider. You may need to reschedule your appointment if you arrive late (15 or more minutes).  Arriving late affects you and other patients whose appointments are after yours.  Also, if you miss three or more appointments without notifying the office, you may be dismissed from the clinic at the provider's discretion.      For prescription refill requests, have your pharmacy contact our office and allow 72 hours for refills to be completed.    Today you received the following chemotherapy and/or immunotherapy agents Enhertu   To help prevent nausea and vomiting after your treatment, we encourage you to take your nausea medication as directed.  Fam-Trastuzumab Deruxtecan Injection What is this medication? FAM-TRASTUZUMAB DERUXTECAN (fam-tras TOOZ eu mab DER ux TEE kan) treats some types of cancer. It works by blocking a protein that causes cancer cells to grow and multiply. This helps to slow or stop the spread of cancer cells. This medicine may be used for other purposes; ask your health care provider or pharmacist if you have questions. COMMON BRAND NAME(S): ENHERTU What should I tell my care team before I take this medication? They need to know if you have any of these conditions: Heart disease Heart failure Infection, especially a viral infection, such as chickenpox, cold sores, or herpes Liver disease Lung or  breathing disease, such as asthma or COPD An unusual or allergic reaction to fam-trastuzumab deruxtecan, other medications, foods, dyes, or preservatives Pregnant or trying to get pregnant Breast-feeding How should I use this medication? This medication is injected into a vein. It is given by your care team in a hospital or clinic setting. A special MedGuide will be given to you before each treatment. Be sure to read this information carefully each time. Talk to your care team about the use of this medication in children. Special care may be needed. Overdosage: If you think you have taken too much of this medicine contact a poison control center or emergency room at once. NOTE: This medicine is only for you. Do not share this medicine with others. What if I miss a dose? It is important not to miss your dose. Call your care team if you are unable to keep an appointment. What may interact with this medication? Interactions are not expected. This list may not describe all possible interactions. Give your health care provider a list of all the medicines, herbs, non-prescription drugs, or dietary supplements you use. Also tell them if you smoke, drink alcohol, or use illegal drugs. Some items may interact with your medicine. What should I watch for while using this medication? Visit your care team for regular checks on your progress. Tell your care team if your symptoms do not start to get better or if they get worse. Your condition will be monitored carefully while you are receiving this medication. Do not become pregnant while taking this medication   or for 7 months after stopping it. Women should inform their care team if they wish to become pregnant or think they might be pregnant. Men should not father a child while taking this medication and for 4 months after stopping it. There is potential for serious side effects to an unborn child. Talk to your care team for more information. Do not  breast-feed an infant while taking this medication or for 7 months after the last dose. This medication has caused decreased sperm counts in some men. This may make it more difficult to father a child. Talk to your care team if you are concerned about your fertility. This medication may increase your risk to bruise or bleed. Call your care team if you notice any unusual bleeding. Be careful brushing or flossing your teeth or using a toothpick because you may get an infection or bleed more easily. If you have any dental work done, tell your dentist you are receiving this medication. This medication may cause dry eyes and blurred vision. If you wear contact lenses, you may feel some discomfort. Lubricating eye drops may help. See your care team if the problem does not go away or is severe. This medication may increase your risk of getting an infection. Call your care team for advice if you get a fever, chills, sore throat, or other symptoms of a cold or flu. Do not treat yourself. Try to avoid being around people who are sick. Avoid taking medications that contain aspirin, acetaminophen, ibuprofen, naproxen, or ketoprofen unless instructed by your care team. These medications may hide a fever. What side effects may I notice from receiving this medication? Side effects that you should report to your care team as soon as possible: Allergic reactions--skin rash, itching, hives, swelling of the face, lips, tongue, or throat Dry cough, shortness of breath or trouble breathing Infection--fever, chills, cough, sore throat, wounds that don't heal, pain or trouble when passing urine, general feeling of discomfort or being unwell Heart failure--shortness of breath, swelling of the ankles, feet, or hands, sudden weight gain, unusual weakness or fatigue Unusual bruising or bleeding Side effects that usually do not require medical attention (report these to your care team if they continue or are  bothersome): Constipation Diarrhea Hair loss Muscle pain Nausea Vomiting This list may not describe all possible side effects. Call your doctor for medical advice about side effects. You may report side effects to FDA at 1-800-FDA-1088. Where should I keep my medication? This medication is given in a hospital or clinic. It will not be stored at home. NOTE: This sheet is a summary. It may not cover all possible information. If you have questions about this medicine, talk to your doctor, pharmacist, or health care provider.  2023 Elsevier/Gold Standard (2021-05-14 00:00:00)   BELOW ARE SYMPTOMS THAT SHOULD BE REPORTED IMMEDIATELY: *FEVER GREATER THAN 100.4 F (38 C) OR HIGHER *CHILLS OR SWEATING *NAUSEA AND VOMITING THAT IS NOT CONTROLLED WITH YOUR NAUSEA MEDICATION *UNUSUAL SHORTNESS OF BREATH *UNUSUAL BRUISING OR BLEEDING *URINARY PROBLEMS (pain or burning when urinating, or frequent urination) *BOWEL PROBLEMS (unusual diarrhea, constipation, pain near the anus) TENDERNESS IN MOUTH AND THROAT WITH OR WITHOUT PRESENCE OF ULCERS (sore throat, sores in mouth, or a toothache) UNUSUAL RASH, SWELLING OR PAIN  UNUSUAL VAGINAL DISCHARGE OR ITCHING   Items with * indicate a potential emergency and should be followed up as soon as possible or go to the Emergency Department if any problems should occur.  Please show the CHEMOTHERAPY   ALERT CARD or IMMUNOTHERAPY ALERT CARD at check-in to the Emergency Department and triage nurse.  Should you have questions after your visit or need to cancel or reschedule your appointment, please contact [facility] [phone]  and follow the prompts.  Office hours are 8:00 a.m. to 4:30 p.m. Monday - Friday. Please note that voicemails left after 4:00 p.m. may not be returned until the following business day.  We are closed weekends and major holidays. You have access to a nurse at all times for urgent questions. Please call the main number  to the clinic [phone] and follow the prompts.  For any non-urgent questions, you may also contact your provider using MyChart. We now offer e-Visits for anyone 18 and older to request care online for non-urgent symptoms. For details visit [email or web address].   Also download the MyChart app! Go to the app store, search "MyChart", open the app, select [place], and log in with your MyChart username and password.   

## 2022-12-21 NOTE — Progress Notes (Unsigned)
Patients port flushed without difficulty.  Good blood return noted with no bruising or swelling noted at site.  Stable during access and blood draw.  Patient to remain accessed for treatment. 

## 2022-12-21 NOTE — Progress Notes (Signed)
[facility] [address] [place] [address]    Clinic Day:  12/21/2022  Referring physician: [name], MD  Patient Care Team: Toma Deiters, MD as PCP - General (Internal Medicine) [name], MD as Medical Oncologist (Medical Oncology)   ASSESSMENT & PLAN:   Assessment: 1.  T3N3c (stage IIIc) triple negative invasive lobular carcinoma of the left breast: - She felt lump in her left breast for more than 6 months, but thought it was secondary to fibrocystic disease which she had all her life.  When she started having pain, she reached out to Dr. Olena Leatherwood. - Ultrasound-guided left breast and left axillary lymph node biopsy on 01/15/2021 - Pathology consistent with invasive lobular carcinoma, E-cadherin negative.  ER/PR/HER2 negative.  HER2 2+ by IHC, negative by FISH.  Ki-67 is 5%.  Lymph node core biopsy was consistent with metastatic carcinoma.  Grade 2. - PET scan on 02/03/2021 showed involvement of left supraclavicular, subpectoral, axillary lymph nodes along with the breast mass.  10 mm left lung nodule which is hypometabolic.  Spinal cord lesion at T12 level with a strong uptake. - MRI of the lumbar spine with and without contrast on 02/20/2021 showed no mass or abnormal enhancement within the canal at the T12 level to correspond to the site of PET scan positive.  No marrow replacing bone lesion. - 12 weeks of carboplatin and paclitaxel weekly and every 3 weeks pembrolizumab followed by 4 cycles of dose dense AC with pembrolizumab ([id number]) from 02/27/2021 through 10/01/2021 - PET scan on 10/16/2021: Reduction in metabolic activity in the size of the left axillary lymph node and no evidence of breast hypermetabolism on PET scan. - Left mastectomy and lymph node excision by Dr. Magnus Ivan on 11/20/2021 - Pathology: 6.2 cm invasive lobular carcinoma, grade 2, margins negative.  19/21 lymph nodes involved with macrometastasis.  ypT3, YPN3A - Adjuvant  pembrolizumab started on 01/01/2022. -CREATE-X capecitabine 1500 mg twice daily 2 weeks on/1 week off started on 01/01/2022.  Last dose of pembrolizumab on 05/11/2022 - Germline mutation testing ([facility]) negative - [employer] (06/04/2022): T p53, K-ras V14 I, CDKN2A.  MSI-high not detected. - PET scan (05/28/2022): Multifocal bone lesions involving T11 vertebral body, right posterior sixth rib, left femoral neck, left scapular glenoid.  No other metastatic disease. - Sacituzumab cycle 1 on 08/18/2022.  She was hospitalized with neutropenic fever and severe diarrhea from 08/24/2022 through 08/27/2022. - CTAP (10/25/2021): Generalized mild to moderate wall thickening throughout the nondistended large bowel with faint pericolonic fat haziness compatible with nonspecific infectious/inflammatory colitis.  No free air.  C. difficile x 2 was negative. - She has UG T1 A1*28 heterozygosity. - Cycle 2 Sacituzumab dose reduced by 50%.  Discontinued secondary to poor tolerance. - Enhertu 3.2 Mg/KG cycle 1 on 11/04/2022     2.  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  She is non-smoker. - Mother died of breast cancer.  Maternal grandmother died very young in her 81s, sister has fibrocystic disease.  Father died of lung cancer and was a smoker.    Plan: 1.  Metastatic TNBC to the bones: - At last visit she had left hip proximal muscle weakness and left foot toes curling up. - When she took steroid premedication for her MRI of the lumbar and thoracic spine, the weakness has improved. - MRI LS spine (12/05/2022): Some new lesions reported when the scan was compared to 05/28/2022.  No extraosseous tumor extension. - Reviewed labs today: Normal LFTs and creatinine.  CBC with mild leukopenia with normal ANC. - She will proceed with cycle 3 Enhertu at 4.4 mg/kg today.  RTC 3 weeks for follow-up.  Will check tumor markers today.  Will plan on repeating scan after  cycle 4. - She reports that she feels short of breath while lying down for the last 1 week.  Lungs are clear to auscultation today.  Recommend using inhaler at bedtime.  It could partly be from anxiety.   2.  Brain lesions (SRS to 3 brain lesions on 06/16/2022): - MRI of the brain on 10/23/2022: Stable to interval decrease in size of previously treated lesions. - She reports that she has a significant co-pay with the MRI done at [facility] protocol.  She will reach out to radiation oncology.  3.  Peripheral neuropathy: - Continue gabapentin 600 mg at bedtime which is helping.  4.  Hypomagnesemia: - Magnesium is normal at 1.8.  Continue OTC magnesium supplements.  Orders Placed This Encounter  Procedures   Cancer antigen 15-3   Cancer antigen 27.29     I,Alexis Herring,acting as a scribe for [name], MD.,have documented all relevant documentation on the behalf of [name], MD,as directed by  [name], MD while in the presence of [name], MD.  I, [name] MD, have reviewed the above documentation for accuracy and completeness, and I agree with the above.    [name], MD   4/8/20247:15 PM  CHIEF COMPLAINT:   Diagnosis: Metastatic triple negative breast cancer to the bones   Cancer Staging  Invasive lobular carcinoma of left breast in female Staging form: Breast, AJCC 8th Edition - Clinical stage from 01/23/2021: cT3, cN3c, G2, ER-, PR-, HER2- - Unsigned - Pathologic stage from 12/22/2021: No Stage Recommended (ypT3, pN3a, cM0, G2, ER-, PR-, HER2-) - Signed by [name], MD on 12/22/2021    Prior Therapy: 1.  Neoadjuvant chemotherapy with weekly carboplatin and paclitaxel and dose dense AC with pembrolizumab 2. Left mastectomy and lymph node excision by Dr. Magnus Ivan on 11/20/2021 3.  Sacituzumab on 08/18/2022 and 10/08/2021, discontinued due to poor tolerance.  Current Therapy:  Enhertu every 21 days    HISTORY OF PRESENT ILLNESS:   Oncology History  Invasive lobular carcinoma of left breast in female  01/23/2021 Initial Diagnosis   Invasive lobular carcinoma of left breast in female [facility])   02/19/2021 Genetic Testing   Negative genetic testing on the CancerNext-Expanded+RNAinsight panel.  SMARCB1 VUS identified.  The CancerNext-Expanded gene panel offered by [facility] and includes sequencing and rearrangement analysis for the following 77 genes: AIP, ALK, APC*, ATM*, AXIN2, BAP1, BARD1, BLM, BMPR1A, BRCA1*, BRCA2*, BRIP1*, CDC73, CDH1*, CDK4, CDKN1B, CDKN2A, CHEK2*, CTNNA1, DICER1, FANCC, FH, FLCN, GALNT12, KIF1B, LZTR1, MAX, MEN1, MET, MLH1*, MSH2*, MSH3, MSH6*, MUTYH*, NBN, NF1*, NF2, NTHL1, PALB2*, PHOX2B, PMS2*, POT1, PRKAR1A, PTCH1, PTEN*, RAD51C*, RAD51D*, RB1, RECQL, RET, SDHA, SDHAF2, SDHB, SDHC, SDHD, SMAD4, SMARCA4, SMARCB1, SMARCE1, STK11, SUFU, TMEM127, TP53*, TSC1, TSC2, VHL and XRCC2 (sequencing and deletion/duplication); EGFR, EGLN1, HOXB13, KIT, MITF, PDGFRA, POLD1, and POLE (sequencing only); EPCAM and GREM1 (deletion/duplication only). DNA and RNA analyses performed for * genes. The report date is February 19, 2021.   02/27/2021 - 05/11/2022 Chemotherapy   Patient is on Treatment Plan : BREAST Pembrolizumab + Carboplatin D1,8,15+ Paclitaxel D1,8,15 q21d X 4 cycles / Pembrolizumab + AC q21d x 4 cycles     12/22/2021 Cancer Staging  Staging form: Breast, AJCC 8th Edition - Pathologic stage from 12/22/2021: No Stage Recommended (ypT3, pN3a, cM0, G2, ER-, PR-, HER2-) - Signed by [name], MD on 12/22/2021 Histopathologic type: Lobular carcinoma, NOS Stage prefix: Post-therapy Method of lymph node assessment: Axillary lymph node dissection Multigene prognostic tests performed: None Histologic grading system: 3 grade system   08/18/2022 - 10/08/2022 Chemotherapy   Patient is on Treatment Plan : BREAST METASTATIC Sacituzumab govitecan-hziy Drinda Butts) D1,8 q21d      11/04/2022 -  Chemotherapy   Patient is on Treatment Plan : BREAST METASTATIC Fam-Trastuzumab Deruxtecan-nxki (Enhertu) (5.4) q21d        INTERVAL HISTORY:   Denise [name] is a 72 y.o. female presenting to clinic today for follow up of Metastatic triple negative breast cancer to the bones. She was last seen by me on 11/30/22.  Today, she states that she is doing well overall. Her appetite level is at 100%. Her energy level is at 50%.  She states that her left hip ROM and pain resolved after taking 3 doses of Prednisone and using Gabapentin BID. She is able to ambulate without any further difficulty.   She reports continued issues with mild tingling in her toes when lying down at night. This improves after applying lotion to her feet and wearing socks for 30 minutes or so.  She reports that despite taking deep breaths when lying in bed she will feel like the air is not getting to her lungs causing her to feel short of breath. This started x1 week ago. She has checked her vitals including her O2 sats during these episodes and they are all normal. She states that slowing her breathing does not always help. This shortness of breath does not wake her once she falls asleep. She denies any cough or wheezing. She wonders if this is related to the pollen as she has had significant allergies requiring her to use eye drops for her itchy eyes- she works out in her yard. However, she has never had similar shortness of breath in the past. She has a Trelegy inhaler at home but never used it.   She would like to move her MRI brain scans out to every 6 months due to the high copays.  PAST MEDICAL HISTORY:   Past Medical History: Past Medical History:  Diagnosis Date   Asthma    as child   Cancer (HCC) 12/2020   left breast IMC   Complication of anesthesia    pateitn states,' I coded when I had my D&Cmany years ago.   Dyspnea    Family history of breast cancer    Hypertension    Hypothyroidism    PONV  (postoperative nausea and vomiting)    Port-A-Cath in place 02/26/2021    Surgical History: Past Surgical History:  Procedure Laterality Date   ABDOMINAL HYSTERECTOMY     BIOPSY  01/17/2018   Procedure: BIOPSY;  Surgeon: Malissa Hippo, MD;  [facility];  Service: Endoscopy;;  duodenum,gastric   CHOLECYSTECTOMY     COLONOSCOPY WITH PROPOFOL N/A 01/17/2018   Procedure: COLONOSCOPY WITH PROPOFOL;  Surgeon: Malissa Hippo, MD;  [facility];  Service: Endoscopy;  Laterality: N/A;  7:30   DILATION AND CURETTAGE OF UTERUS     ESOPHAGOGASTRODUODENOSCOPY (EGD) WITH PROPOFOL N/A 01/17/2018   Procedure: ESOPHAGOGASTRODUODENOSCOPY (EGD) WITH PROPOFOL;  Surgeon: Malissa Hippo, MD;  [facility];  Service: Endoscopy;  Laterality: N/A;   POLYPECTOMY  01/17/2018   Procedure:  POLYPECTOMY;  Surgeon: Malissa Hippo, MD;  [facility];  Service: Endoscopy;;  transverse colon, cecal   PORTACATH PLACEMENT Right 02/17/2021   Procedure: INSERTION PORT-A-CATH;  Surgeon: Abigail Miyamoto, MD;  Location: [facility];  Service: General;  Laterality: Right;   RADIOACTIVE SEED GUIDED AXILLARY SENTINEL LYMPH NODE Left 11/20/2021   Procedure: RADIOACTIVE SEED GUIDED LEFT AXILLARY SENTINEL LYMPH NODE DISSECTION;  Surgeon: Abigail Miyamoto, MD;  Location: [facility];  Service: General;  Laterality: Left;   TOTAL MASTECTOMY Left 11/20/2021   Procedure: LEFT TOTAL MASTECTOMY;  Surgeon: Abigail Miyamoto, MD;  Location: [facility];  Service: General;  Laterality: Left;    Social History: Social History   Socioeconomic History   Marital status: Widowed    Spouse name: Not on file   Number of children: 3   Years of education: Not on file   Highest education level: Not on file  Occupational History   Occupation: [employer]   Occupation: retired  Tobacco Use   Smoking status: Never   Smokeless tobacco: Never   Vaping Use   Vaping Use: Never used  Substance and Sexual Activity   Alcohol use: Never   Drug use: Never   Sexual activity: Not Currently    Birth control/protection: Surgical  Other Topics Concern   Not on file  Social History Narrative   Not on file   Social Determinants of Health   Financial Resource Strain: Low Risk  (07/10/2022)   Overall Financial Resource Strain (CARDIA)    Difficulty of Paying Living Expenses: Not hard at all  Food Insecurity: No Food Insecurity (06/30/2022)   Hunger Vital Sign    Worried About Running Out of Food in the Last Year: Never true    Ran Out of Food in the Last Year: Never true  Transportation Needs: No Transportation Needs (07/10/2022)   PRAPARE - Administrator, Civil Service (Medical): No    Lack of Transportation (Non-Medical): No  Physical Activity: Insufficiently Active (01/22/2021)   Exercise Vital Sign    Days of Exercise per Week: 3 days    Minutes of Exercise per Session: 30 min  Stress: No Stress Concern Present (01/22/2021)   [employer] of Occupational Health - Occupational Stress Questionnaire    Feeling of Stress : Not at all  Social Connections: Moderately Integrated (01/22/2021)   Social Connection and Isolation Panel [NHANES]    Frequency of Communication with Friends and Family: More than three times a week    Frequency of Social Gatherings with Friends and Family: More than three times a week    Attends Religious Services: More than 4 times per year    Active Member of [employer] or Organizations: No    Attends Engineer, structural: More than 4 times per year    Marital Status: Widowed  Intimate Partner Violence: Not At Risk (05/28/2022)   Humiliation, Afraid, Rape, and Kick questionnaire    Fear of Current or Ex-Partner: No    Emotionally Abused: No    Physically Abused: No    Sexually Abused: No    Family History: Family History  Problem Relation Age of Onset   Breast cancer Mother         dx in her 73s   Heart disease Mother    Thyroid disease Mother    Lung cancer Father        dx in his 60s   Thyroid disease Maternal  Aunt    Thyroid nodules Maternal Grandmother 35       goiter   Thyroid disease Maternal Grandmother    Heart disease Maternal Grandfather    Heart disease Paternal Grandmother    Heart disease Paternal Grandfather    Thyroid disease Daughter    Thyroid disease Daughter    Cancer Maternal Uncle        NOS   Cancer Paternal Uncle        NOS   Breast cancer Cousin        pat first cousin died in her 67s;     Current Medications:  Current Outpatient Medications:    Acetaminophen (TYLENOL PO), Take 1 tablet by mouth as needed., Disp: , Rfl:    amoxicillin-clavulanate (AUGMENTIN) 875-125 MG tablet, Take 1 tablet by mouth 2 (two) times daily., Disp: 14 tablet, Rfl: 0   gabapentin (NEURONTIN) 300 MG capsule, Take 1 capsule by mouth 2 (two) times daily., Disp: , Rfl:    hydrALAZINE (APRESOLINE) 25 MG tablet, Take 25 mg by mouth 3 (three) times daily., Disp: , Rfl:    levothyroxine (SYNTHROID) 75 MCG tablet, Take 100 mcg by mouth daily before breakfast., Disp: , Rfl:    Melatonin 5 MG TABS, Take 10 mg by mouth. As needed for sleep, Disp: , Rfl:    olmesartan-hydrochlorothiazide (BENICAR HCT) 40-25 MG tablet, Take 1 tablet by mouth daily., Disp: , Rfl:    pantoprazole (PROTONIX) 40 MG tablet, Take 40 mg by mouth daily., Disp: , Rfl:    predniSONE (DELTASONE) 50 MG tablet, Take one tab po 13 hours prior, then one tab 7 hours prior, and then one tab 1 hour prior to MRI, Disp: 3 tablet, Rfl: 0 No current facility-administered medications for this visit.  Facility-Administered Medications Ordered in Other Visits:    sodium chloride flush (NS) 0.9 % injection 10 mL, 10 mL, Intracatheter, PRN, [name], MD, 10 mL at 12/21/22 1341   Allergies: Allergies  Allergen Reactions   Exforge [Amlodipine Besylate-Valsartan] Nausea And Vomiting   Gadolinium  Derivatives Hives, Itching and Anaphylaxis    13hr prep prior to contrast admin  MRI iv contrast   Gadopiclenol Hives and Itching    13 hr prep prior to contrast admin    Tape Other (See Comments)    Redness and Irriatation   Cheese     Hard cheese   Levofloxacin In D5w Hives   Other    Proanthocyanidin    Strawberry Extract Diarrhea    Seeds,nuts, lettuce, grapes   Wild Lettuce Extract (Lactuca Virosa)    Yeast-Related Products Hives    Mold on bread    REVIEW OF SYSTEMS:   Review of Systems  Constitutional:  Positive for fatigue. Negative for chills and fever.  HENT:   Negative for lump/mass, mouth sores, nosebleeds, sore throat and trouble swallowing.   Eyes:  Negative for eye problems.  Respiratory:  Positive for shortness of breath. Negative for cough.   Cardiovascular:  Negative for chest pain, leg swelling and palpitations.  Gastrointestinal:  Positive for constipation and diarrhea. Negative for abdominal pain, nausea and vomiting.  Genitourinary:  Positive for nocturia. Negative for bladder incontinence, difficulty urinating, dysuria, frequency and hematuria.   Musculoskeletal:  Negative for arthralgias, back pain, flank pain, myalgias and neck pain.  Skin:  Negative for itching and rash.  Neurological:  Positive for headaches and numbness (tingling in her toes). Negative for dizziness.  Hematological:  Does not bruise/bleed easily.  Psychiatric/Behavioral:  Positive for sleep disturbance. Negative for depression and suicidal ideas. The patient is not nervous/anxious.   All other systems reviewed and are negative.    VITALS:   There were no vitals taken for this visit.  Wt Readings from Last 3 Encounters:  12/21/22 168 lb 9.6 oz (76.5 kg)  12/01/22 167 lb 12.8 oz (76.1 kg)  11/30/22 165 lb (74.8 kg)    There is no height or weight on file to calculate BMI.  Performance status (ECOG): 1 - Symptomatic but completely ambulatory  PHYSICAL EXAM:   Physical  Exam Vitals and nursing note reviewed. Exam conducted with a chaperone present.  Constitutional:      Appearance: Normal appearance.  Cardiovascular:     Rate and Rhythm: Normal rate and regular rhythm.     Pulses: Normal pulses.     Heart sounds: Normal heart sounds.  Pulmonary:     Effort: Pulmonary effort is normal.     Breath sounds: Normal breath sounds.  Abdominal:     Palpations: Abdomen is soft. There is no hepatomegaly, splenomegaly or mass.     Tenderness: There is no abdominal tenderness.  Musculoskeletal:     Right lower leg: No edema.     Left lower leg: No edema.  Lymphadenopathy:     Cervical: No cervical adenopathy.     Right cervical: No superficial, deep or posterior cervical adenopathy.    Left cervical: No superficial, deep or posterior cervical adenopathy.     Upper Body:     Right upper body: No supraclavicular or axillary adenopathy.     Left upper body: No supraclavicular or axillary adenopathy.  Neurological:     General: No focal deficit present.     Mental Status: She is alert and oriented to person, place, and time.  Psychiatric:        Mood and Affect: Mood normal.        Behavior: Behavior normal.     LABS:      Latest Ref Rng & Units 12/21/2022    9:37 AM 12/01/2022    8:30 AM 11/30/2022    9:36 AM  CBC  WBC 4.0 - 10.5 K/uL 3.6  12.3  2.6   Hemoglobin 12.0 - 15.0 g/dL 16.1  09.6  04.5   Hematocrit 36.0 - 46.0 % 31.2  33.9  32.6   Platelets 150 - 400 K/uL 214  194  177       Latest Ref Rng & Units 12/21/2022    9:37 AM 11/30/2022    9:36 AM 11/04/2022    8:54 AM  CMP  Glucose 70 - 99 mg/dL 93  99  409   BUN 8 - 23 mg/dL Creatinine 0.44 - 1.00 mg/dL 8.11  9.14  7.82   Sodium 135 - 145 mmol/L 134  135  135   Potassium 3.5 - 5.1 mmol/L 3.8  3.9  3.8   Chloride 98 - 111 mmol/L 102  103  102   CO2 22 - 32 mmol/L Calcium 8.9 - 10.3 mg/dL 8.7  8.5  8.4   Total Protein 6.5 - 8.1 g/dL 6.6  6.9  6.7   Total Bilirubin  0.3 - 1.2 mg/dL 0.3  0.5  0.7   Alkaline Phos 38 - 126 U/L 114  110  100   AST 15 - 41 U/L ALT 0 - 44 U/L  12  13  16       No results found for: "CEA1", "CEA" / No results found for: "CEA1", "CEA" No results found for: "PSA1" No results found for: "[id number]" No results found for: "CAN125"  No results found for: "TOTALPROTELP", "ALBUMINELP", "A1GS", "A2GS", "BETS", "BETA2SER", "GAMS", "MSPIKE", "SPEI" No results found for: "TIBC", "FERRITIN", "IRONPCTSAT" No results found for: "LDH"   STUDIES:    MRI head without and with contrast 10/23/22: IMPRESSION: 1. Stable to slight interval decrease in the size of intracranial metastasis in the right frontoparietal region. 2. Decreased conspicuity of punctate lesion in the cerebellar vermis. 3. Multifocal osseous metastatic disease.  MR Thoracic Spine W Wo Contrast  Result Date: 12/09/2022 CLINICAL DATA:  Metastatic disease evaluation. Right lower extremity pain and weakness in the setting of metastatic disease. EXAM: MRI THORACIC AND LUMBAR SPINE WITHOUT AND WITH CONTRAST TECHNIQUE: Multiplanar and multiecho pulse sequences of the thoracic and lumbar spine were obtained without and with intravenous contrast. CONTRAST:  7.475mL GADAVIST GADOBUTROL 1 MMOL/ML IV SOLN COMPARISON:  05/23/2022 FINDINGS: MRI THORACIC SPINE FINDINGS Alignment:  Physiologic. Vertebrae: Bone lesions consistent with metastatic disease: *1 cm lesion in the T2 spinous process. *Right posterior sixth rib lesion *T8 posterior body lesion involving approximately 50% of the body. *T11 enhancing lesion involving a proximally 50% of the body, known from prior imaging although somewhat larger. Accentuated fatty marrow at T9-T12 which may be from radiotherapy. No extraosseous tumor or fracture associated with these lesions. Cord: Normal signal and morphology. No evidence of intrathecal metastatic disease. Paraspinal and other soft tissues: Recent staging chest CT. No new finding  Disc levels: Ordinary disc desiccation and narrowing with endplate spurring throughout the thoracic spine. No neural impingement. MRI LUMBAR SPINE FINDINGS Segmentation:  Standard. Alignment:  Mild scoliosis and L4-5 anterolisthesis. Vertebrae: 16 mm posterosuperior S1 corner lesion with mild superior endplate deformity, new or progressive. Larger S1 left pedicle lesion known from prior. Suspect posterior arch metastatic deposit at the level of S4. L2 left articular process lesion measuring 14 mm. No extraosseous tumor or acute fracture. Conus medullaris: Extends to the L1 level and appears normal. Paraspinal and other soft tissues: Recent staging abdominal CT Disc levels: Disc narrowing and bulging with facet spurring throughout the lumbar spine. No nerve root compression. IMPRESSION: 1. Osseous metastatic disease with new and progressive deposits since thoracic comparison by 05/28/2022 PET-CT and lumbar comparison by MRI 05/23/2022. 2. Lesions are seen at T2, right sixth rib, T8, T11, L2, S1, and S4. No acute fracture or extraosseous tumor extension. Electronically Signed   By: Tiburcio PeaJonathan  Watts M.D.   On: 12/09/2022 05:24   MR Lumbar Spine W Wo Contrast  Result Date: 12/09/2022 CLINICAL DATA:  Metastatic disease evaluation. Right lower extremity pain and weakness in the setting of metastatic disease. EXAM: MRI THORACIC AND LUMBAR SPINE WITHOUT AND WITH CONTRAST TECHNIQUE: Multiplanar and multiecho pulse sequences of the thoracic and lumbar spine were obtained without and with intravenous contrast. CONTRAST:  7.665mL GADAVIST GADOBUTROL 1 MMOL/ML IV SOLN COMPARISON:  05/23/2022 FINDINGS: MRI THORACIC SPINE FINDINGS Alignment:  Physiologic. Vertebrae: Bone lesions consistent with metastatic disease: *1 cm lesion in the T2 spinous process. *Right posterior sixth rib lesion *T8 posterior body lesion involving approximately 50% of the body. *T11 enhancing lesion involving a proximally 50% of the body, known from prior  imaging although somewhat larger. Accentuated fatty marrow at T9-T12 which may be from radiotherapy. No extraosseous tumor or fracture associated with these lesions. Cord: Normal signal and  morphology. No evidence of intrathecal metastatic disease. Paraspinal and other soft tissues: Recent staging chest CT. No new finding Disc levels: Ordinary disc desiccation and narrowing with endplate spurring throughout the thoracic spine. No neural impingement. MRI LUMBAR SPINE FINDINGS Segmentation:  Standard. Alignment:  Mild scoliosis and L4-5 anterolisthesis. Vertebrae: 16 mm posterosuperior S1 corner lesion with mild superior endplate deformity, new or progressive. Larger S1 left pedicle lesion known from prior. Suspect posterior arch metastatic deposit at the level of S4. L2 left articular process lesion measuring 14 mm. No extraosseous tumor or acute fracture. Conus medullaris: Extends to the L1 level and appears normal. Paraspinal and other soft tissues: Recent staging abdominal CT Disc levels: Disc narrowing and bulging with facet spurring throughout the lumbar spine. No nerve root compression. IMPRESSION: 1. Osseous metastatic disease with new and progressive deposits since thoracic comparison by 05/28/2022 PET-CT and lumbar comparison by MRI 05/23/2022. 2. Lesions are seen at T2, right sixth rib, T8, T11, L2, S1, and S4. No acute fracture or extraosseous tumor extension. Electronically Signed   By: Tiburcio Pea M.D.   On: 12/09/2022 05:24

## 2022-12-21 NOTE — Progress Notes (Signed)
Patient has been examined by Dr. Katragadda. Vital signs and labs have been reviewed by MD - ANC, Creatinine, LFTs, hemoglobin, and platelets are within treatment parameters per M.D. - pt may proceed with treatment.  Primary RN and pharmacy notified.  

## 2022-12-21 NOTE — Patient Instructions (Addendum)
[facility] at [facility] Discharge Instructions   You were seen and examined today by Dr. Ellin Saba.  He reviewed the results of your lab work which are normal/stable.   He reviewed the results of your MRIs of the thoracic spine and lumbar spine. The results are stable.   Continue gabapentin as prescribed (600 mg at bedtime).   We will proceed with your treatment today.   Return as scheduled.    Thank you for choosing [facility] at [facility] to provide your oncology and hematology care.  To afford each patient quality time with our provider, please arrive at least 15 minutes before your scheduled appointment time.   If you have a lab appointment with the Cancer Center please come in thru the Main Entrance and check in at the main information desk.  You need to re-schedule your appointment should you arrive 10 or more minutes late.  We strive to give you quality time with our providers, and arriving late affects you and other patients whose appointments are after yours.  Also, if you no show three or more times for appointments you may be dismissed from the clinic at the providers discretion.     Again, thank you for choosing [facility].  Our hope is that these requests will decrease the amount of time that you wait before being seen by our physicians.       _____________________________________________________________  Should you have questions after your visit to [facility], please contact our office at [phone] and follow the prompts.  Our office hours are 8:00 a.m. and 4:30 p.m. Monday - Friday.  Please note that voicemails left after 4:00 p.m. may not be returned until the following business day.  We are closed weekends and major holidays.  You do have access to a nurse 24-7, just call the main number to the clinic [phone] and do not press any options, hold on the line and a nurse will answer the  phone.    For prescription refill requests, have your pharmacy contact our office and allow 72 hours.    Due to Covid, you will need to wear a mask upon entering the hospital. If you do not have a mask, a mask will be given to you at the Main Entrance upon arrival. For doctor visits, patients may have 1 support person age 74 or older with them. For treatment visits, patients can not have anyone with them due to social distancing guidelines and our immunocompromised population.

## 2022-12-21 NOTE — Progress Notes (Signed)
Patient presents today for Enhertu infusion. Patient is in satisfactory condition with no new complaints voiced.  Vital signs are stable.  Labs reviewed by Dr. Katragadda during the office visit and all labs are within treatment parameters.  We will proceed with treatment per MD orders.   Enhertu given today per MD orders. Tolerated infusion without adverse affects. Vital signs stable. No complaints at this time. Discharged from clinic ambulatory in stable condition. Alert and oriented x 3. F/U with [facility] as scheduled.   

## 2022-12-22 ENCOUNTER — Encounter (HOSPITAL_COMMUNITY): Payer: Self-pay | Admitting: Hematology

## 2022-12-22 ENCOUNTER — Encounter: Payer: Self-pay | Admitting: Hematology

## 2022-12-22 ENCOUNTER — Other Ambulatory Visit: Payer: Self-pay

## 2022-12-22 LAB — CANCER ANTIGEN 15-3: CA 15-3: 24.4 U/mL (ref 0.0–25.0)

## 2022-12-23 ENCOUNTER — Telehealth: Payer: Self-pay | Admitting: *Deleted

## 2022-12-23 ENCOUNTER — Inpatient Hospital Stay: Payer: Medicare Other

## 2022-12-23 VITALS — BP 127/70 | HR 69 | Temp 98.2°F | Resp 16

## 2022-12-23 DIAGNOSIS — Z5189 Encounter for other specified aftercare: Secondary | ICD-10-CM | POA: Diagnosis not present

## 2022-12-23 DIAGNOSIS — R911 Solitary pulmonary nodule: Secondary | ICD-10-CM | POA: Diagnosis not present

## 2022-12-23 DIAGNOSIS — C7951 Secondary malignant neoplasm of bone: Secondary | ICD-10-CM | POA: Diagnosis not present

## 2022-12-23 DIAGNOSIS — Z95828 Presence of other vascular implants and grafts: Secondary | ICD-10-CM

## 2022-12-23 DIAGNOSIS — C50912 Malignant neoplasm of unspecified site of left female breast: Secondary | ICD-10-CM

## 2022-12-23 DIAGNOSIS — Z5111 Encounter for antineoplastic chemotherapy: Secondary | ICD-10-CM | POA: Diagnosis not present

## 2022-12-23 DIAGNOSIS — Z171 Estrogen receptor negative status [ER-]: Secondary | ICD-10-CM | POA: Diagnosis not present

## 2022-12-23 LAB — CANCER ANTIGEN 27.29: CA 27.29: 30.7 U/mL (ref 0.0–38.6)

## 2022-12-23 MED ORDER — PEGFILGRASTIM-PBBK 6 MG/0.6ML ~~LOC~~ SOSY
6.0000 mg | PREFILLED_SYRINGE | Freq: Once | SUBCUTANEOUS | Status: AC
  Administered 2022-12-23: 6 mg via SUBCUTANEOUS
  Filled 2022-12-23: qty 0.6

## 2022-12-23 NOTE — Progress Notes (Signed)
Denise [name] presents today for injection per the provider's orders.  Fylnetra  administration without incident; injection site WNL; see MAR for injection details.  Patient tolerated procedure well and without incident.  No questions or complaints noted at this time. Discharged from clinic ambulatory in stable condition. Alert and oriented x 3. F/U with [facility] as scheduled.

## 2022-12-23 NOTE — Telephone Encounter (Signed)
Called patient to inform that MRI has been moved to 04-23-23- arrival time- 10 am @ [facility], and Laurence Aly to call her with results on 04-26-23 @ 2:30 pm via telephone, spoke with patient and she is aware of these appts. and the instructions

## 2022-12-25 ENCOUNTER — Other Ambulatory Visit: Payer: Self-pay

## 2023-01-04 ENCOUNTER — Other Ambulatory Visit: Payer: Self-pay | Admitting: *Deleted

## 2023-01-04 ENCOUNTER — Ambulatory Visit (HOSPITAL_COMMUNITY)
Admission: RE | Admit: 2023-01-04 | Discharge: 2023-01-04 | Disposition: A | Discharge status: Home / Self Care | Payer: Medicare Other | Source: Ambulatory Visit | Attending: Hematology | Admitting: Hematology

## 2023-01-04 ENCOUNTER — Telehealth: Payer: Self-pay | Admitting: *Deleted

## 2023-01-04 DIAGNOSIS — J439 Emphysema, unspecified: Secondary | ICD-10-CM | POA: Diagnosis not present

## 2023-01-04 DIAGNOSIS — R0989 Other specified symptoms and signs involving the circulatory and respiratory systems: Secondary | ICD-10-CM

## 2023-01-04 DIAGNOSIS — R911 Solitary pulmonary nodule: Secondary | ICD-10-CM | POA: Diagnosis not present

## 2023-01-04 DIAGNOSIS — R059 Cough, unspecified: Secondary | ICD-10-CM | POA: Diagnosis not present

## 2023-01-04 DIAGNOSIS — R0789 Other chest pain: Secondary | ICD-10-CM | POA: Diagnosis not present

## 2023-01-04 MED ORDER — AMOXICILLIN-POT CLAVULANATE 875-125 MG PO TABS: 1.0000 | ORAL_TABLET | Freq: Two times a day (BID) | ORAL | 0 refills | Status: DC

## 2023-01-04 NOTE — Telephone Encounter (Addendum)
Patient called requesting Augmentin for URI symptoms.  Tightness in lungs, sore throat, nasal drip and low grade fever x 2 weeks.  Dr. Ellin Saba made aware and we will obtain baseline CXR and assess from there.  CXR normal.  Augmentin sent in for patient per Dr. Marice Potter recommendations. Patient aware.

## 2023-01-08 ENCOUNTER — Encounter (INDEPENDENT_AMBULATORY_CARE_PROVIDER_SITE_OTHER): Payer: Self-pay | Admitting: *Deleted

## 2023-01-10 NOTE — Progress Notes (Signed)
[facility] [address], [place] [address]    Clinic Day:  01/11/2023  Referring physician: Toma Deiters, MD  Patient Care Team: Denise Deiters, MD as PCP - General (Internal Medicine) Denise Massed, MD as Medical Oncologist (Medical Oncology)   ASSESSMENT & PLAN:   Assessment: 1.  T3N3c (stage IIIc) triple negative invasive lobular carcinoma of the left breast: - She felt lump in her left breast for more than 6 months, but thought it was secondary to fibrocystic disease which she had all her life.  When she started having pain, she reached out to Dr. Olena [name]. - Ultrasound-guided left breast and left axillary lymph node biopsy on 01/15/2021 - Pathology consistent with invasive lobular carcinoma, E-cadherin negative.  ER/PR/HER2 negative.  HER2 2+ by IHC, negative by FISH.  Ki-67 is 5%.  Lymph node core biopsy was consistent with metastatic carcinoma.  Grade 2. - PET scan on 02/03/2021 showed involvement of left supraclavicular, subpectoral, axillary lymph nodes along with the breast mass.  10 mm left lung nodule which is hypometabolic.  Spinal cord lesion at T12 level with a strong uptake. - MRI of the lumbar spine with and without contrast on 02/20/2021 showed no mass or abnormal enhancement within the canal at the T12 level to correspond to the site of PET scan positive.  No marrow replacing bone lesion. - 12 weeks of carboplatin and paclitaxel weekly and every 3 weeks pembrolizumab followed by 4 cycles of dose dense AC with pembrolizumab ([id number]) from 02/27/2021 through 10/01/2021 - PET scan on 10/16/2021: Reduction in metabolic activity in the size of the left axillary lymph node and no evidence of breast hypermetabolism on PET scan. - Left mastectomy and lymph node excision by Dr. Magnus [name] on 11/20/2021 - Pathology: 6.2 cm invasive lobular carcinoma, grade 2, margins negative.  19/21 lymph nodes involved with macrometastasis.  ypT3, YPN3A - Adjuvant  pembrolizumab started on 01/01/2022. -CREATE-X capecitabine 1500 mg twice daily 2 weeks on/1 week off started on 01/01/2022.  Last dose of pembrolizumab on 05/11/2022 - Germline mutation testing ([facility] [employer]) negative - [employer] (06/04/2022): T p53, K-ras V14 I, CDKN2A.  MSI-high not detected. - PET scan (05/28/2022): Multifocal bone lesions involving T11 vertebral body, right posterior sixth rib, left femoral neck, left scapular glenoid.  No other metastatic disease. - Sacituzumab cycle 1 on 08/18/2022.  She was hospitalized with neutropenic fever and severe diarrhea from 08/24/2022 through 08/27/2022. - CTAP (10/25/2021): Generalized mild to moderate wall thickening throughout the nondistended large bowel with faint pericolonic fat haziness compatible with nonspecific infectious/inflammatory colitis.  No free air.  C. difficile x 2 was negative. - She has UG T1 A1*28 heterozygosity. - Cycle 2 Sacituzumab dose reduced by 50%.  Discontinued secondary to poor tolerance. - Enhertu 3.2 Mg/KG cycle 1 on 11/04/2022   2.  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  She is non-smoker. - Mother died of breast cancer.  Maternal grandmother died very young in her 63s, sister has fibrocystic disease.  Father died of lung cancer and was a smoker.    Plan: 1.  Metastatic TNBC to the bones: - She received cycle 3 of Enhertu on 12/21/2022. - She had sore throat and chest congestion last week and took Augmentin which cleared it. - Overall she is feeling very well with improvement in pain and energy levels. - Reviewed labs today: Normal LFTs with slightly elevated alk phos.  CBC was grossly  normal.  CA 15-3 has normalized to 24.4. - Proceed with cycle 4 today.  RTC 3 weeks with CT CAP with contrast.  2.  Brain lesions (SRS to 3 brain lesions on 06/16/2022): - Last MRI of the brain on 10/23/2022: Stable to interval decrease in size of previously treated  lesions.  3.  Peripheral neuropathy: - Continue gabapentin 600 mg at bedtime which is helping.  4.  Hypomagnesemia: - Continue OTC magnesium supplements.  Magnesium is normal today.  Orders Placed This Encounter  Procedures   CT CHEST ABDOMEN PELVIS W CONTRAST    Standing Status:   Future    Standing Expiration Date:   01/11/2024    Order Specific Question:   If indicated for the ordered procedure, I authorize the administration of contrast media per Radiology protocol    Answer:   Yes    Order Specific Question:   Does the patient have a contrast media/X-ray dye allergy?    Answer:   No    Order Specific Question:   Preferred imaging location?    Answer:   [facility]    Order Specific Question:   If indicated for the ordered procedure, I authorize the administration of oral contrast media per Radiology protocol    Answer:   Yes      I,Denise [name],acting as a scribe for Denise Massed, MD.,have documented all relevant documentation on the behalf of Denise Massed, MD,as directed by  Denise Massed, MD while in the presence of Denise Massed, MD.   I, Denise Massed MD, have reviewed the above documentation for accuracy and completeness, and I agree with the above.   Denise Massed, MD   4/29/20244:09 PM  CHIEF COMPLAINT:   Diagnosis: Metastatic triple negative breast cancer to the bones    Cancer Staging  Invasive lobular carcinoma of left breast in female [name]) Staging form: Breast, AJCC 8th Edition - Clinical stage from 01/23/2021: cT3, cN3c, G2, ER-, PR-, HER2- - Unsigned - Pathologic stage from 12/22/2021: No Stage Recommended (ypT3, pN3a, cM0, G2, ER-, PR-, HER2-) - Signed by Denise Massed, MD on 12/22/2021    Prior Therapy: 1.  Neoadjuvant chemotherapy with weekly carboplatin and paclitaxel and dose dense AC with pembrolizumab 2. Left mastectomy and lymph node excision by Dr. Magnus [name] on 11/20/2021 3.  Sacituzumab on  08/18/2022 and 10/08/2021, discontinued due to poor tolerance.  Current Therapy:  Enhertu every 21 days    HISTORY OF PRESENT ILLNESS:   Oncology History  Invasive lobular carcinoma of left breast in female [facility])  01/23/2021 Initial Diagnosis   Invasive lobular carcinoma of left breast in female [facility])   02/19/2021 Genetic Testing   Negative genetic testing on the CancerNext-Expanded+RNAinsight panel.  SMARCB1 VUS identified.  The CancerNext-Expanded gene panel offered by [facility] and includes sequencing and rearrangement analysis for the following 77 genes: AIP, ALK, APC*, ATM*, AXIN2, BAP1, BARD1, BLM, BMPR1A, BRCA1*, BRCA2*, BRIP1*, CDC73, CDH1*, CDK4, CDKN1B, CDKN2A, CHEK2*, CTNNA1, DICER1, FANCC, FH, FLCN, GALNT12, KIF1B, LZTR1, MAX, MEN1, MET, MLH1*, MSH2*, MSH3, MSH6*, MUTYH*, NBN, NF1*, NF2, NTHL1, PALB2*, PHOX2B, PMS2*, POT1, PRKAR1A, PTCH1, PTEN*, RAD51C*, RAD51D*, RB1, RECQL, RET, SDHA, SDHAF2, SDHB, SDHC, SDHD, SMAD4, SMARCA4, SMARCB1, SMARCE1, STK11, SUFU, TMEM127, TP53*, TSC1, TSC2, VHL and XRCC2 (sequencing and deletion/duplication); EGFR, EGLN1, HOXB13, KIT, MITF, PDGFRA, POLD1, and POLE (sequencing only); EPCAM and GREM1 (deletion/duplication only). DNA and RNA analyses performed for * genes. The report date is February 19, 2021.   02/27/2021 - 05/11/2022 Chemotherapy   Patient  is on Treatment Plan : BREAST Pembrolizumab + Carboplatin D1,8,15+ Paclitaxel D1,8,15 q21d X 4 cycles / Pembrolizumab + AC q21d x 4 cycles     12/22/2021 Cancer Staging   Staging form: Breast, AJCC 8th Edition - Pathologic stage from 12/22/2021: No Stage Recommended (ypT3, pN3a, cM0, G2, ER-, PR-, HER2-) - Signed by Denise Massed, MD on 12/22/2021 Histopathologic type: Lobular carcinoma, NOS Stage prefix: Post-therapy Method of lymph node assessment: Axillary lymph node dissection Multigene prognostic tests performed: None Histologic grading system: 3 grade system   08/18/2022 - 10/08/2022 Chemotherapy    Patient is on Treatment Plan : BREAST METASTATIC Sacituzumab govitecan-hziy Drinda Butts) D1,8 q21d     11/04/2022 -  Chemotherapy   Patient is on Treatment Plan : BREAST METASTATIC Fam-Trastuzumab Deruxtecan-nxki (Enhertu) (5.4) q21d        INTERVAL HISTORY:   Denise [name] is a 72 y.o. female presenting to clinic today for follow up of Metastatic triple negative breast cancer to the bones. She was last seen by me on 12/21/22.  Since her last visit, she underwent chest x-ray on 01/04/23 for URI symptoms. Results showed no acute abnormalities.  Of note, she is scheduled for restaging CT C/A/P on 01/29/23 and surveillance brain MRI on 04/23/23.  Today, she states that she is doing well overall. Her appetite level is at 100%. Her energy level is at 50%.  PAST MEDICAL HISTORY:   Past Medical History: Past Medical History:  Diagnosis Date   Asthma    as child   Cancer (HCC) 12/2020   left breast IMC   Complication of anesthesia    pateitn states,' I coded when I had my D&Cmany years ago.   Dyspnea    Family history of breast cancer    Hypertension    Hypothyroidism    PONV (postoperative nausea and vomiting)    Port-A-Cath in place 02/26/2021    Surgical History: Past Surgical History:  Procedure Laterality Date   ABDOMINAL HYSTERECTOMY     BIOPSY  01/17/2018   Procedure: BIOPSY;  Surgeon: Malissa Hippo, MD;  [facility];  Service: Endoscopy;;  duodenum,gastric   CHOLECYSTECTOMY     COLONOSCOPY WITH PROPOFOL N/A 01/17/2018   Procedure: COLONOSCOPY WITH PROPOFOL;  Surgeon: Malissa Hippo, MD;  [facility];  Service: Endoscopy;  Laterality: N/A;  7:30   DILATION AND CURETTAGE OF UTERUS     ESOPHAGOGASTRODUODENOSCOPY (EGD) WITH PROPOFOL N/A 01/17/2018   Procedure: ESOPHAGOGASTRODUODENOSCOPY (EGD) WITH PROPOFOL;  Surgeon: Malissa Hippo, MD;  [facility];  Service: Endoscopy;  Laterality: N/A;   POLYPECTOMY  01/17/2018   Procedure: POLYPECTOMY;   Surgeon: Malissa Hippo, MD;  [facility];  Service: Endoscopy;;  transverse colon, cecal   PORTACATH PLACEMENT Right 02/17/2021   Procedure: INSERTION PORT-A-CATH;  Surgeon: Abigail Miyamoto, MD;  Location: [facility];  Service: General;  Laterality: Right;   RADIOACTIVE SEED GUIDED AXILLARY SENTINEL LYMPH NODE Left 11/20/2021   Procedure: RADIOACTIVE SEED GUIDED LEFT AXILLARY SENTINEL LYMPH NODE DISSECTION;  Surgeon: Abigail Miyamoto, MD;  Location: [facility];  Service: General;  Laterality: Left;   TOTAL MASTECTOMY Left 11/20/2021   Procedure: LEFT TOTAL MASTECTOMY;  Surgeon: Abigail Miyamoto, MD;  Location:  SURGERY CENTER;  Service: General;  Laterality: Left;    Social History: Social History   Socioeconomic History   Marital status: Widowed    Spouse name: Not on file   Number of children: 3   Years  of education: Not on file   Highest education level: Not on file  Occupational History   Occupation: [employer]   Occupation: retired  Tobacco Use   Smoking status: Never   Smokeless tobacco: Never  Vaping Use   Vaping Use: Never used  Substance and Sexual Activity   Alcohol use: Never   Drug use: Never   Sexual activity: Not Currently    Birth control/protection: Surgical  Other Topics Concern   Not on file  Social History Narrative   Not on file   Social Determinants of Health   Financial Resource Strain: Low Risk  (07/10/2022)   Overall Financial Resource Strain (CARDIA)    Difficulty of Paying Living Expenses: Not hard at all  Food Insecurity: No Food Insecurity (06/30/2022)   Hunger Vital Sign    Worried About Running Out of Food in the Last Year: Never true    Ran Out of Food in the Last Year: Never true  Transportation Needs: No Transportation Needs (07/10/2022)   PRAPARE - Administrator, Civil Service (Medical): No    Lack of Transportation (Non-Medical): No  Physical Activity:  Insufficiently Active (01/22/2021)   Exercise Vital Sign    Days of Exercise per Week: 3 days    Minutes of Exercise per Session: 30 min  Stress: No Stress Concern Present (01/22/2021)   [employer] of Occupational Health - Occupational Stress Questionnaire    Feeling of Stress : Not at all  Social Connections: Moderately Integrated (01/22/2021)   Social Connection and Isolation Panel [NHANES]    Frequency of Communication with Friends and Family: More than three times a week    Frequency of Social Gatherings with Friends and Family: More than three times a week    Attends Religious Services: More than 4 times per year    Active Member of [employer] or Organizations: No    Attends Engineer, structural: More than 4 times per year    Marital Status: Widowed  Intimate Partner Violence: Not At Risk (05/28/2022)   Humiliation, Afraid, Rape, and Kick questionnaire    Fear of Current or Ex-Partner: No    Emotionally Abused: No    Physically Abused: No    Sexually Abused: No    Family History: Family History  Problem Relation Age of Onset   Breast cancer Mother        dx in her 16s   Heart disease Mother    Thyroid disease Mother    Lung cancer Father        dx in his 3s   Thyroid disease Maternal Aunt    Thyroid nodules Maternal Grandmother 35       goiter   Thyroid disease Maternal Grandmother    Heart disease Maternal Grandfather    Heart disease Paternal Grandmother    Heart disease Paternal Grandfather    Thyroid disease Daughter    Thyroid disease Daughter    Cancer Maternal Uncle        NOS   Cancer Paternal Uncle        NOS   Breast cancer Cousin        pat first cousin died in her 91s;     Current Medications:  Current Outpatient Medications:    Acetaminophen (TYLENOL PO), Take 1 tablet by mouth as needed., Disp: , Rfl:    amoxicillin-clavulanate (AUGMENTIN) 875-125 MG tablet, Take 1 tablet by mouth 2 (two) times daily., Disp: 14 tablet, Rfl: 0  gabapentin (NEURONTIN) 300 MG capsule, Take 1 capsule by mouth 2 (two) times daily., Disp: , Rfl:    hydrALAZINE (APRESOLINE) 25 MG tablet, Take 25 mg by mouth 3 (three) times daily., Disp: , Rfl:    levothyroxine (SYNTHROID) 75 MCG tablet, Take 100 mcg by mouth daily before breakfast., Disp: , Rfl:    Melatonin 5 MG TABS, Take 10 mg by mouth. As needed for sleep, Disp: , Rfl:    olmesartan-hydrochlorothiazide (BENICAR HCT) 40-25 MG tablet, Take 1 tablet by mouth daily., Disp: , Rfl:    pantoprazole (PROTONIX) 40 MG tablet, Take 40 mg by mouth daily., Disp: , Rfl:    predniSONE (DELTASONE) 50 MG tablet, Take one tab po 13 hours prior, then one tab 7 hours prior, and then one tab 1 hour prior to MRI, Disp: 3 tablet, Rfl: 0 No current facility-administered medications for this visit.  Facility-Administered Medications Ordered in Other Visits:    sodium chloride flush (NS) 0.9 % injection 10 mL, 10 mL, Intracatheter, PRN, Denise Massed, MD, 10 mL at 01/11/23 1558   Allergies: Allergies  Allergen Reactions   Exforge [Amlodipine Besylate-Valsartan] Nausea And Vomiting   Gadolinium Derivatives Hives, Itching and Anaphylaxis    13hr prep prior to contrast admin  MRI iv contrast   Gadopiclenol Hives and Itching    13 hr prep prior to contrast admin    Tape Other (See Comments)    Redness and Irriatation   Cheese     Hard cheese   Levofloxacin In D5w Hives   Other    Proanthocyanidin    Strawberry Extract Diarrhea    Seeds,nuts, lettuce, grapes   Wild Lettuce Extract (Lactuca Virosa)    Yeast-Related Products Hives    Mold on bread    REVIEW OF SYSTEMS:   Review of Systems  Constitutional:  Negative for chills, fatigue and fever.  HENT:   Negative for lump/mass, mouth sores, nosebleeds, sore throat and trouble swallowing.   Eyes:  Negative for eye problems.  Respiratory:  Negative for cough and shortness of breath.   Cardiovascular:  Positive for palpitations. Negative for  chest pain and leg swelling.  Gastrointestinal:  Negative for abdominal pain, constipation, diarrhea, nausea and vomiting.  Genitourinary:  Negative for bladder incontinence, difficulty urinating, dysuria, frequency, hematuria and nocturia.   Musculoskeletal:  Negative for arthralgias, back pain, flank pain, myalgias and neck pain.  Skin:  Negative for itching and rash.  Neurological:  Positive for headaches and numbness. Negative for dizziness.  Hematological:  Does not bruise/bleed easily.  Psychiatric/Behavioral:  Positive for sleep disturbance. Negative for depression and suicidal ideas. The patient is not nervous/anxious.   All other systems reviewed and are negative.    VITALS:   There were no vitals taken for this visit.  Wt Readings from Last 3 Encounters:  01/11/23 167 lb 6.4 oz (75.9 kg)  12/21/22 168 lb 9.6 oz (76.5 kg)  12/01/22 167 lb 12.8 oz (76.1 kg)    There is no height or weight on file to calculate BMI.  Performance status (ECOG): 1 - Symptomatic but completely ambulatory  PHYSICAL EXAM:   Physical Exam Vitals and nursing note reviewed. Exam conducted with a chaperone present.  Constitutional:      Appearance: Normal appearance.  Cardiovascular:     Rate and Rhythm: Normal rate and regular rhythm.     Pulses: Normal pulses.     Heart sounds: Normal heart sounds.  Pulmonary:     Effort: Pulmonary effort is  normal.     Breath sounds: Normal breath sounds.  Abdominal:     Palpations: Abdomen is soft. There is no hepatomegaly, splenomegaly or mass.     Tenderness: There is no abdominal tenderness.  Musculoskeletal:     Right lower leg: No edema.     Left lower leg: No edema.  Lymphadenopathy:     Cervical: No cervical adenopathy.     Right cervical: No superficial, deep or posterior cervical adenopathy.    Left cervical: No superficial, deep or posterior cervical adenopathy.     Upper Body:     Right upper body: No supraclavicular or axillary adenopathy.      Left upper body: No supraclavicular or axillary adenopathy.  Neurological:     General: No focal deficit present.     Mental Status: She is alert and oriented to person, place, and time.  Psychiatric:        Mood and Affect: Mood normal.        Behavior: Behavior normal.     LABS:      Latest Ref Rng & Units 01/11/2023   11:56 AM 12/21/2022    9:37 AM 12/01/2022    8:30 AM  CBC  WBC 4.0 - 10.5 K/uL 5.1  3.6  12.3   Hemoglobin 12.0 - 15.0 g/dL 16.1  09.6  04.5   Hematocrit 36.0 - 46.0 % 33.8  31.2  33.9   Platelets 150 - 400 K/uL 251  214  194       Latest Ref Rng & Units 01/11/2023   11:56 AM 12/21/2022    9:37 AM 11/30/2022    9:36 AM  CMP  Glucose 70 - 99 mg/dL 409  93  99   BUN 8 - 23 mg/dL 22  15  18    Creatinine 0.44 - 1.00 mg/dL 8.11  9.14  7.82   Sodium 135 - 145 mmol/L 136  134  135   Potassium 3.5 - 5.1 mmol/L 3.7  3.8  3.9   Chloride 98 - 111 mmol/L 101  102  103   CO2 22 - 32 mmol/L 25  25  25    Calcium 8.9 - 10.3 mg/dL 9.0  8.7  8.5   Total Protein 6.5 - 8.1 g/dL 7.0  6.6  6.9   Total Bilirubin 0.3 - 1.2 mg/dL 0.5  0.3  0.5   Alkaline Phos 38 - 126 U/L 128  114  110   AST 15 - 41 U/L 17  14  19    ALT 0 - 44 U/L 12  12  13       No results found for: "CEA1", "CEA" / No results found for: "CEA1", "CEA" No results found for: "PSA1" No results found for: "NFA213" No results found for: "CAN125"  No results found for: "TOTALPROTELP", "ALBUMINELP", "A1GS", "A2GS", "BETS", "BETA2SER", "GAMS", "MSPIKE", "SPEI" No results found for: "TIBC", "FERRITIN", "IRONPCTSAT" No results found for: "LDH"   STUDIES:   DG Chest 2 View  Result Date: 01/04/2023 CLINICAL DATA:  URI symptoms, chest tightness, shortness of breath, and cough for 2 weeks, cancer patient on chemotherapy, history asthma EXAM: CHEST - 2 VIEW COMPARISON:  01/29/2022 FINDINGS: RIGHT subclavian Port-A-Cath with tip projecting over SVC. Upper normal size of cardiac silhouette. Mediastinal contours and  pulmonary vascularity normal. Lungs appear emphysematous but clear. Calcified granuloma LEFT upper lobe stable. Again identified nodular density mid LEFT lung laterally 13 x 9 mm corresponding to noncalcified pulmonary nodule on prior CT. No acute pulmonary  infiltrate, pleural effusion, or pneumothorax. Diffuse osseous demineralization. Post LEFT mastectomy and LEFT axillary lymph node dissection. IMPRESSION: Stable LEFT midlung nodule and calcified LEFT upper lobe granuloma. No acute abnormalities. Electronically Signed   By: Ulyses Southward M.D.   On: 01/04/2023 14:39

## 2023-01-11 ENCOUNTER — Inpatient Hospital Stay: Payer: Medicare Other

## 2023-01-11 ENCOUNTER — Inpatient Hospital Stay (HOSPITAL_BASED_OUTPATIENT_CLINIC_OR_DEPARTMENT_OTHER): Payer: Medicare Other | Admitting: Hematology

## 2023-01-11 VITALS — BP 128/83 | HR 65 | Temp 97.5°F | Resp 18

## 2023-01-11 DIAGNOSIS — C50912 Malignant neoplasm of unspecified site of left female breast: Secondary | ICD-10-CM

## 2023-01-11 DIAGNOSIS — Z5189 Encounter for other specified aftercare: Secondary | ICD-10-CM | POA: Diagnosis not present

## 2023-01-11 DIAGNOSIS — Z5111 Encounter for antineoplastic chemotherapy: Secondary | ICD-10-CM | POA: Diagnosis not present

## 2023-01-11 DIAGNOSIS — Z95828 Presence of other vascular implants and grafts: Secondary | ICD-10-CM | POA: Diagnosis not present

## 2023-01-11 DIAGNOSIS — C7951 Secondary malignant neoplasm of bone: Secondary | ICD-10-CM | POA: Diagnosis not present

## 2023-01-11 DIAGNOSIS — Z171 Estrogen receptor negative status [ER-]: Secondary | ICD-10-CM | POA: Diagnosis not present

## 2023-01-11 DIAGNOSIS — R911 Solitary pulmonary nodule: Secondary | ICD-10-CM | POA: Diagnosis not present

## 2023-01-11 LAB — COMPREHENSIVE METABOLIC PANEL
ALT: 12 U/L (ref 0–44)
AST: 17 U/L (ref 15–41)
Albumin: 3.6 g/dL (ref 3.5–5.0)
Alkaline Phosphatase: 128 U/L — ABNORMAL HIGH (ref 38–126)
Anion gap: 10 (ref 5–15)
BUN: 22 mg/dL (ref 8–23)
CO2: 25 mmol/L (ref 22–32)
Calcium: 9 mg/dL (ref 8.9–10.3)
Chloride: 101 mmol/L (ref 98–111)
Creatinine, Ser: 0.76 mg/dL (ref 0.44–1.00)
GFR, Estimated: >60 mL/min (ref >60)
Glucose, Bld: 101 mg/dL — ABNORMAL HIGH (ref 70–99)
Potassium: 3.7 mmol/L (ref 3.5–5.1)
Sodium: 136 mmol/L (ref 135–145)
Total Bilirubin: 0.5 mg/dL (ref 0.3–1.2)
Total Protein: 7 g/dL (ref 6.5–8.1)

## 2023-01-11 LAB — CBC WITH DIFFERENTIAL/PLATELET
Abs Immature Granulocytes: 0.02 10*3/uL (ref 0.00–0.07)
Basophils Absolute: 0 10*3/uL (ref 0.0–0.1)
Basophils Relative: 0 %
Eosinophils Absolute: 0.4 10*3/uL (ref 0.0–0.5)
Eosinophils Relative: 8 %
HCT: 33.8 % — ABNORMAL LOW (ref 36.0–46.0)
Hemoglobin: 11.3 g/dL — ABNORMAL LOW (ref 12.0–15.0)
Immature Granulocytes: 0 %
Lymphocytes Relative: 29 %
Lymphs Abs: 1.5 10*3/uL (ref 0.7–4.0)
MCH: 34.8 pg — ABNORMAL HIGH (ref 26.0–34.0)
MCHC: 33.4 g/dL (ref 30.0–36.0)
MCV: 104 fL — ABNORMAL HIGH (ref 80.0–100.0)
Monocytes Absolute: 0.5 10*3/uL (ref 0.1–1.0)
Monocytes Relative: 10 %
Neutro Abs: 2.6 10*3/uL (ref 1.7–7.7)
Neutrophils Relative %: 53 %
Platelets: 251 10*3/uL (ref 150–400)
RBC: 3.25 MIL/uL — ABNORMAL LOW (ref 3.87–5.11)
RDW: 14.7 % (ref 11.5–15.5)
WBC: 5.1 10*3/uL (ref 4.0–10.5)
nRBC: 0 % (ref 0.0–0.2)

## 2023-01-11 LAB — MAGNESIUM: Magnesium: 1.9 mg/dL (ref 1.7–2.4)

## 2023-01-11 MED ORDER — ACETAMINOPHEN 325 MG PO TABS
650.0000 mg | ORAL_TABLET | Freq: Once | ORAL | Status: AC
  Administered 2023-01-11: 650 mg via ORAL
  Filled 2023-01-11: qty 2

## 2023-01-11 MED ORDER — HEPARIN SOD (PORK) LOCK FLUSH 100 UNIT/ML IV SOLN
500.0000 [IU] | Freq: Once | INTRAVENOUS | Status: AC | PRN
  Administered 2023-01-11: 500 [IU]

## 2023-01-11 MED ORDER — CETIRIZINE HCL 10 MG/ML IV SOLN
10.0000 mg | Freq: Once | INTRAVENOUS | Status: AC
  Administered 2023-01-11: 10 mg via INTRAVENOUS
  Filled 2023-01-11: qty 1

## 2023-01-11 MED ORDER — SODIUM CHLORIDE 0.9% FLUSH
10.0000 mL | Freq: Once | INTRAVENOUS | Status: AC
  Administered 2023-01-11: 10 mL via INTRAVENOUS

## 2023-01-11 MED ORDER — SODIUM CHLORIDE 0.9 % IV SOLN
10.0000 mg | Freq: Once | INTRAVENOUS | Status: AC
  Administered 2023-01-11: 10 mg via INTRAVENOUS
  Filled 2023-01-11: qty 10

## 2023-01-11 MED ORDER — DEXTROSE 5 % IV SOLN: Freq: Once | INTRAVENOUS | Status: AC

## 2023-01-11 MED ORDER — SODIUM CHLORIDE 0.9% FLUSH
10.0000 mL | INTRAVENOUS | Status: DC | PRN
  Administered 2023-01-11: 10 mL

## 2023-01-11 MED ORDER — SODIUM CHLORIDE 0.9 % IV SOLN
150.0000 mg | Freq: Once | INTRAVENOUS | Status: AC
  Administered 2023-01-11: 150 mg via INTRAVENOUS
  Filled 2023-01-11: qty 5

## 2023-01-11 MED ORDER — FAM-TRASTUZUMAB DERUXTECAN-NXKI CHEMO 100 MG IV SOLR
4.4000 mg/kg | Freq: Once | INTRAVENOUS | Status: AC
  Administered 2023-01-11: 300 mg via INTRAVENOUS
  Filled 2023-01-11: qty 15

## 2023-01-11 MED ORDER — PALONOSETRON HCL INJECTION 0.25 MG/5ML
0.2500 mg | Freq: Once | INTRAVENOUS | Status: AC
  Administered 2023-01-11: 0.25 mg via INTRAVENOUS
  Filled 2023-01-11: qty 5

## 2023-01-11 NOTE — Progress Notes (Signed)
Maintain dose at 300 mg despite slight weight increase.  T.O. Dr Katragadda/Kimela Malstrom, PharmD 

## 2023-01-11 NOTE — Progress Notes (Signed)
Patient has been examined by Dr. Katragadda. Vital signs and labs have been reviewed by MD - ANC, Creatinine, LFTs, hemoglobin, and platelets are within treatment parameters per M.D. - pt may proceed with treatment.  Primary RN and pharmacy notified.  

## 2023-01-11 NOTE — Progress Notes (Signed)
Patient presents today for Enhertu infusion. Patient is in satisfactory condition with no new complaints voiced.  Vital signs are stable.  Labs reviewed by Dr. Ellin Saba during the office visit and all labs are within treatment parameters.  We will proceed with treatment per MD orders.   Patient tolerated treatment well with no complaints voiced.  Patient left ambulatory in stable condition.  Vital signs stable at discharge.  Follow up as scheduled.

## 2023-01-11 NOTE — Patient Instructions (Signed)
[facility]  Discharge Instructions: Thank you for choosing [facility] to provide your oncology and hematology care.  If you have a lab appointment with the Cancer Center - please note that after April 8th, 2024, all labs will be drawn in the cancer center.  You do not have to check in or register with the main entrance as you have in the past but will complete your check-in in the cancer center.  Wear comfortable clothing and clothing appropriate for easy access to any Portacath or PICC line.   We strive to give you quality time with your provider. You may need to reschedule your appointment if you arrive late (15 or more minutes).  Arriving late affects you and other patients whose appointments are after yours.  Also, if you miss three or more appointments without notifying the office, you may be dismissed from the clinic at the provider's discretion.      For prescription refill requests, have your pharmacy contact our office and allow 72 hours for refills to be completed.    Today you received the following chemotherapy and/or immunotherapy agents Enhertu.  Fam-Trastuzumab Deruxtecan Injection What is this medication? FAM-TRASTUZUMAB DERUXTECAN (fam-tras TOOZ eu mab DER ux TEE kan) treats some types of cancer. It works by blocking a protein that causes cancer cells to grow and multiply. This helps to slow or stop the spread of cancer cells. This medicine may be used for other purposes; ask your health care provider or pharmacist if you have questions. COMMON BRAND NAME(S): ENHERTU What should I tell my care team before I take this medication? They need to know if you have any of these conditions: Heart disease Heart failure Infection, especially a viral infection, such as chickenpox, cold sores, or herpes Liver disease Lung or breathing disease, such as asthma or COPD An unusual or allergic reaction to fam-trastuzumab deruxtecan, other medications,  foods, dyes, or preservatives Pregnant or trying to get pregnant Breast-feeding How should I use this medication? This medication is injected into a vein. It is given by your care team in a hospital or clinic setting. A special MedGuide will be given to you before each treatment. Be sure to read this information carefully each time. Talk to your care team about the use of this medication in children. Special care may be needed. Overdosage: If you think you have taken too much of this medicine contact a poison control center or emergency room at once. NOTE: This medicine is only for you. Do not share this medicine with others. What if I miss a dose? It is important not to miss your dose. Call your care team if you are unable to keep an appointment. What may interact with this medication? Interactions are not expected. This list may not describe all possible interactions. Give your health care provider a list of all the medicines, herbs, non-prescription drugs, or dietary supplements you use. Also tell them if you smoke, drink alcohol, or use illegal drugs. Some items may interact with your medicine. What should I watch for while using this medication? Visit your care team for regular checks on your progress. Tell your care team if your symptoms do not start to get better or if they get worse. Your condition will be monitored carefully while you are receiving this medication. Do not become pregnant while taking this medication or for 7 months after stopping it. Women should inform their care team if they wish to become pregnant or think  they might be pregnant. Men should not father a child while taking this medication and for 4 months after stopping it. There is potential for serious side effects to an unborn child. Talk to your care team for more information. Do not breast-feed an infant while taking this medication or for 7 months after the last dose. This medication has caused decreased sperm  counts in some men. This may make it more difficult to father a child. Talk to your care team if you are concerned about your fertility. This medication may increase your risk to bruise or bleed. Call your care team if you notice any unusual bleeding. Be careful brushing or flossing your teeth or using a toothpick because you may get an infection or bleed more easily. If you have any dental work done, tell your dentist you are receiving this medication. This medication may cause dry eyes and blurred vision. If you wear contact lenses, you may feel some discomfort. Lubricating eye drops may help. See your care team if the problem does not go away or is severe. This medication may increase your risk of getting an infection. Call your care team for advice if you get a fever, chills, sore throat, or other symptoms of a cold or flu. Do not treat yourself. Try to avoid being around people who are sick. Avoid taking medications that contain aspirin, acetaminophen, ibuprofen, naproxen, or ketoprofen unless instructed by your care team. These medications may hide a fever. What side effects may I notice from receiving this medication? Side effects that you should report to your care team as soon as possible: Allergic reactions--skin rash, itching, hives, swelling of the face, lips, tongue, or throat Dry cough, shortness of breath or trouble breathing Infection--fever, chills, cough, sore throat, wounds that don't heal, pain or trouble when passing urine, general feeling of discomfort or being unwell Heart failure--shortness of breath, swelling of the ankles, feet, or hands, sudden weight gain, unusual weakness or fatigue Unusual bruising or bleeding Side effects that usually do not require medical attention (report these to your care team if they continue or are bothersome): Constipation Diarrhea Hair loss Muscle pain Nausea Vomiting This list may not describe all possible side effects. Call your doctor  for medical advice about side effects. You may report side effects to FDA at 1-800-FDA-1088. Where should I keep my medication? This medication is given in a hospital or clinic. It will not be stored at home. NOTE: This sheet is a summary. It may not cover all possible information. If you have questions about this medicine, talk to your doctor, pharmacist, or health care provider.  2023 Elsevier/Gold Standard (2021-05-14 00:00:00)        To help prevent nausea and vomiting after your treatment, we encourage you to take your nausea medication as directed.  BELOW ARE SYMPTOMS THAT SHOULD BE REPORTED IMMEDIATELY: *FEVER GREATER THAN 100.4 F (38 C) OR HIGHER *CHILLS OR SWEATING *NAUSEA AND VOMITING THAT IS NOT CONTROLLED WITH YOUR NAUSEA MEDICATION *UNUSUAL SHORTNESS OF BREATH *UNUSUAL BRUISING OR BLEEDING *URINARY PROBLEMS (pain or burning when urinating, or frequent urination) *BOWEL PROBLEMS (unusual diarrhea, constipation, pain near the anus) TENDERNESS IN MOUTH AND THROAT WITH OR WITHOUT PRESENCE OF ULCERS (sore throat, sores in mouth, or a toothache) UNUSUAL RASH, SWELLING OR PAIN  UNUSUAL VAGINAL DISCHARGE OR ITCHING   Items with * indicate a potential emergency and should be followed up as soon as possible or go to the Emergency Department if any problems should occur.  Please show the CHEMOTHERAPY ALERT CARD or IMMUNOTHERAPY ALERT CARD at check-in to the Emergency Department and triage nurse.  Should you have questions after your visit or need to cancel or reschedule your appointment, please contact [facility] CENTER AT [facility] [phone]  and follow the prompts.  Office hours are 8:00 a.m. to 4:30 p.m. Monday - Friday. Please note that voicemails left after 4:00 p.m. may not be returned until the following business day.  We are closed weekends and major holidays. You have access to a nurse at all times for urgent questions. Please call the main number to the clinic  [phone] and follow the prompts.  For any non-urgent questions, you may also contact your provider using MyChart. We now offer e-Visits for anyone 72 and older to request care online for non-urgent symptoms. For details visit [email or web address].   Also download the MyChart app! Go to the app store, search "MyChart", open the app, select [place], and log in with your MyChart username and password.

## 2023-01-11 NOTE — Patient Instructions (Signed)
[facility] at [facility] Discharge Instructions   You were seen and examined today by Dr. Katragadda.  He reviewed the results of your lab work which are normal/stable.   We will proceed with your treatment today.  Return as scheduled.    Thank you for choosing  Cancer Center at [facility] to provide your oncology and hematology care.  To afford each patient quality time with our provider, please arrive at least 15 minutes before your scheduled appointment time.   If you have a lab appointment with the Cancer Center please come in thru the Main Entrance and check in at the main information desk.  You need to re-schedule your appointment should you arrive 10 or more minutes late.  We strive to give you quality time with our providers, and arriving late affects you and other patients whose appointments are after yours.  Also, if you no show three or more times for appointments you may be dismissed from the clinic at the providers discretion.     Again, thank you for choosing [facility].  Our hope is that these requests will decrease the amount of time that you wait before being seen by our physicians.       _____________________________________________________________  Should you have questions after your visit to [facility], please contact our office at [phone] and follow the prompts.  Our office hours are 8:00 a.m. and 4:30 p.m. Monday - Friday.  Please note that voicemails left after 4:00 p.m. may not be returned until the following business day.  We are closed weekends and major holidays.  You do have access to a nurse 24-7, just call the main number to the clinic [phone] and do not press any options, hold on the line and a nurse will answer the phone.    For prescription refill requests, have your pharmacy contact our office and allow 72 hours.    Due to Covid, you will need to wear a mask upon entering  the hospital. If you do not have a mask, a mask will be given to you at the Main Entrance upon arrival. For doctor visits, patients may have 1 support person age 18 or older with them. For treatment visits, patients can not have anyone with them due to social distancing guidelines and our immunocompromised population.      

## 2023-01-13 ENCOUNTER — Inpatient Hospital Stay: Payer: Medicare Other | Attending: Hematology

## 2023-01-13 VITALS — BP 103/53 | HR 74 | Temp 98.9°F | Resp 16

## 2023-01-13 DIAGNOSIS — Z5112 Encounter for antineoplastic immunotherapy: Secondary | ICD-10-CM | POA: Insufficient documentation

## 2023-01-13 DIAGNOSIS — C7951 Secondary malignant neoplasm of bone: Secondary | ICD-10-CM | POA: Diagnosis not present

## 2023-01-13 DIAGNOSIS — Z9012 Acquired absence of left breast and nipple: Secondary | ICD-10-CM | POA: Diagnosis not present

## 2023-01-13 DIAGNOSIS — K5730 Diverticulosis of large intestine without perforation or abscess without bleeding: Secondary | ICD-10-CM | POA: Diagnosis not present

## 2023-01-13 DIAGNOSIS — R918 Other nonspecific abnormal finding of lung field: Secondary | ICD-10-CM | POA: Insufficient documentation

## 2023-01-13 DIAGNOSIS — Z803 Family history of malignant neoplasm of breast: Secondary | ICD-10-CM | POA: Insufficient documentation

## 2023-01-13 DIAGNOSIS — I10 Essential (primary) hypertension: Secondary | ICD-10-CM | POA: Insufficient documentation

## 2023-01-13 DIAGNOSIS — R748 Abnormal levels of other serum enzymes: Secondary | ICD-10-CM | POA: Insufficient documentation

## 2023-01-13 DIAGNOSIS — I700 Atherosclerosis of aorta: Secondary | ICD-10-CM | POA: Insufficient documentation

## 2023-01-13 DIAGNOSIS — E039 Hypothyroidism, unspecified: Secondary | ICD-10-CM | POA: Insufficient documentation

## 2023-01-13 DIAGNOSIS — C7931 Secondary malignant neoplasm of brain: Secondary | ICD-10-CM | POA: Insufficient documentation

## 2023-01-13 DIAGNOSIS — G629 Polyneuropathy, unspecified: Secondary | ICD-10-CM | POA: Insufficient documentation

## 2023-01-13 DIAGNOSIS — C50912 Malignant neoplasm of unspecified site of left female breast: Secondary | ICD-10-CM

## 2023-01-13 DIAGNOSIS — Z95828 Presence of other vascular implants and grafts: Secondary | ICD-10-CM

## 2023-01-13 MED ORDER — PEGFILGRASTIM-PBBK 6 MG/0.6ML ~~LOC~~ SOSY
6.0000 mg | PREFILLED_SYRINGE | Freq: Once | SUBCUTANEOUS | Status: AC
  Administered 2023-01-13: 6 mg via SUBCUTANEOUS
  Filled 2023-01-13: qty 0.6

## 2023-01-13 NOTE — Progress Notes (Signed)
Patient tolerated injection with no complaints voiced. Site clean and dry with no bruising or swelling noted at site. See MAR for details. Band aid applied.  Patient stable during and after injection. VSS with discharge and left in satisfactory condition with no s/s of distress noted.  

## 2023-01-13 NOTE — Patient Instructions (Signed)
[facility]  Discharge Instructions: Thank you for choosing [facility] to provide your oncology and hematology care.  If you have a lab appointment with the Cancer Center - please note that after April 8th, 2024, all labs will be drawn in the cancer center.  You do not have to check in or register with the main entrance as you have in the past but will complete your check-in in the cancer center.  Wear comfortable clothing and clothing appropriate for easy access to any Portacath or PICC line.   We strive to give you quality time with your provider. You may need to reschedule your appointment if you arrive late (15 or more minutes).  Arriving late affects you and other patients whose appointments are after yours.  Also, if you miss three or more appointments without notifying the office, you may be dismissed from the clinic at the provider's discretion.      For prescription refill requests, have your pharmacy contact our office and allow 72 hours for refills to be completed.    Today you received the following [place], return as scheduled.    To help prevent nausea and vomiting after your treatment, we encourage you to take your nausea medication as directed.  BELOW ARE SYMPTOMS THAT SHOULD BE REPORTED IMMEDIATELY: *FEVER GREATER THAN 100.4 F (38 C) OR HIGHER *CHILLS OR SWEATING *NAUSEA AND VOMITING THAT IS NOT CONTROLLED WITH YOUR NAUSEA MEDICATION *UNUSUAL SHORTNESS OF BREATH *UNUSUAL BRUISING OR BLEEDING *URINARY PROBLEMS (pain or burning when urinating, or frequent urination) *BOWEL PROBLEMS (unusual diarrhea, constipation, pain near the anus) TENDERNESS IN MOUTH AND THROAT WITH OR WITHOUT PRESENCE OF ULCERS (sore throat, sores in mouth, or a toothache) UNUSUAL RASH, SWELLING OR PAIN  UNUSUAL VAGINAL DISCHARGE OR ITCHING   Items with * indicate a potential emergency and should be followed up as soon as possible or go to the Emergency Department if  any problems should occur.  Please show the CHEMOTHERAPY ALERT CARD or IMMUNOTHERAPY ALERT CARD at check-in to the Emergency Department and triage nurse.  Should you have questions after your visit or need to cancel or reschedule your appointment, please contact [facility] CENTER AT [facility] [phone]  and follow the prompts.  Office hours are 8:00 a.m. to 4:30 p.m. Monday - Friday. Please note that voicemails left after 4:00 p.m. may not be returned until the following business day.  We are closed weekends and major holidays. You have access to a nurse at all times for urgent questions. Please call the main number to the clinic [phone] and follow the prompts.  For any non-urgent questions, you may also contact your provider using MyChart. We now offer e-Visits for anyone 78 and older to request care online for non-urgent symptoms. For details visit [email or web address].   Also download the MyChart app! Go to the app store, search "MyChart", open the app, select [place], and log in with your MyChart username and password.

## 2023-01-19 ENCOUNTER — Other Ambulatory Visit: Payer: Medicare Other

## 2023-01-22 ENCOUNTER — Inpatient Hospital Stay: Admission: RE | Admit: 2023-01-22 | Payer: Medicare Other | Source: Ambulatory Visit

## 2023-01-25 ENCOUNTER — Ambulatory Visit: Payer: Self-pay | Admitting: Radiation Oncology

## 2023-01-29 ENCOUNTER — Ambulatory Visit (HOSPITAL_COMMUNITY)
Admission: RE | Admit: 2023-01-29 | Discharge: 2023-01-29 | Disposition: A | Discharge status: Home / Self Care | Payer: Medicare Other | Source: Ambulatory Visit | Attending: Hematology | Admitting: Hematology

## 2023-01-29 DIAGNOSIS — C50912 Malignant neoplasm of unspecified site of left female breast: Secondary | ICD-10-CM | POA: Diagnosis not present

## 2023-01-29 DIAGNOSIS — C50919 Malignant neoplasm of unspecified site of unspecified female breast: Secondary | ICD-10-CM | POA: Diagnosis not present

## 2023-01-29 LAB — POCT I-STAT CREATININE: Creatinine, Ser: 0.8 mg/dL (ref 0.44–1.00)

## 2023-01-29 MED ORDER — IOHEXOL 300 MG/ML  SOLN
100.0000 mL | Freq: Once | INTRAMUSCULAR | Status: AC | PRN
  Administered 2023-01-29: 100 mL via INTRAVENOUS

## 2023-01-29 MED ORDER — HEPARIN SOD (PORK) LOCK FLUSH 100 UNIT/ML IV SOLN
INTRAVENOUS | Status: AC
  Filled 2023-01-29: qty 5

## 2023-01-29 MED ORDER — HEPARIN SOD (PORK) LOCK FLUSH 100 UNIT/ML IV SOLN
500.0000 [IU] | Freq: Once | INTRAVENOUS | Status: AC
  Administered 2023-01-29: 500 [IU] via INTRAVENOUS

## 2023-01-29 NOTE — Progress Notes (Signed)
The following biosimilar Stimufend ([id number]-[id number]) has been selected for use in this patient.   Vail Vuncannon, PharmD 

## 2023-02-01 ENCOUNTER — Inpatient Hospital Stay: Payer: Medicare Other

## 2023-02-01 ENCOUNTER — Inpatient Hospital Stay (HOSPITAL_BASED_OUTPATIENT_CLINIC_OR_DEPARTMENT_OTHER): Payer: Medicare Other | Admitting: Hematology

## 2023-02-01 VITALS — BP 125/75 | HR 63 | Temp 98.6°F | Resp 16

## 2023-02-01 DIAGNOSIS — C7951 Secondary malignant neoplasm of bone: Secondary | ICD-10-CM | POA: Diagnosis not present

## 2023-02-01 DIAGNOSIS — Z95828 Presence of other vascular implants and grafts: Secondary | ICD-10-CM | POA: Diagnosis not present

## 2023-02-01 DIAGNOSIS — C50912 Malignant neoplasm of unspecified site of left female breast: Secondary | ICD-10-CM

## 2023-02-01 DIAGNOSIS — R748 Abnormal levels of other serum enzymes: Secondary | ICD-10-CM | POA: Diagnosis not present

## 2023-02-01 DIAGNOSIS — G629 Polyneuropathy, unspecified: Secondary | ICD-10-CM | POA: Diagnosis not present

## 2023-02-01 DIAGNOSIS — Z5112 Encounter for antineoplastic immunotherapy: Secondary | ICD-10-CM | POA: Diagnosis not present

## 2023-02-01 DIAGNOSIS — C7931 Secondary malignant neoplasm of brain: Secondary | ICD-10-CM | POA: Diagnosis not present

## 2023-02-01 LAB — COMPREHENSIVE METABOLIC PANEL
ALT: 14 U/L (ref 0–44)
AST: 17 U/L (ref 15–41)
Albumin: 3.4 g/dL — ABNORMAL LOW (ref 3.5–5.0)
Alkaline Phosphatase: 134 U/L — ABNORMAL HIGH (ref 38–126)
Anion gap: 7 (ref 5–15)
BUN: 24 mg/dL — ABNORMAL HIGH (ref 8–23)
CO2: 24 mmol/L (ref 22–32)
Calcium: 8.6 mg/dL — ABNORMAL LOW (ref 8.9–10.3)
Chloride: 106 mmol/L (ref 98–111)
Creatinine, Ser: 0.79 mg/dL (ref 0.44–1.00)
GFR, Estimated: >60 mL/min (ref >60)
Glucose, Bld: 101 mg/dL — ABNORMAL HIGH (ref 70–99)
Potassium: 3.8 mmol/L (ref 3.5–5.1)
Sodium: 137 mmol/L (ref 135–145)
Total Bilirubin: 0.5 mg/dL (ref 0.3–1.2)
Total Protein: 6.4 g/dL — ABNORMAL LOW (ref 6.5–8.1)

## 2023-02-01 LAB — CBC WITH DIFFERENTIAL/PLATELET
Abs Immature Granulocytes: 0.02 10*3/uL (ref 0.00–0.07)
Basophils Absolute: 0 10*3/uL (ref 0.0–0.1)
Basophils Relative: 0 %
Eosinophils Absolute: 0.5 10*3/uL (ref 0.0–0.5)
Eosinophils Relative: 9 %
HCT: 32.9 % — ABNORMAL LOW (ref 36.0–46.0)
Hemoglobin: 11 g/dL — ABNORMAL LOW (ref 12.0–15.0)
Immature Granulocytes: 0 %
Lymphocytes Relative: 25 %
Lymphs Abs: 1.5 10*3/uL (ref 0.7–4.0)
MCH: 35.6 pg — ABNORMAL HIGH (ref 26.0–34.0)
MCHC: 33.4 g/dL (ref 30.0–36.0)
MCV: 106.5 fL — ABNORMAL HIGH (ref 80.0–100.0)
Monocytes Absolute: 0.5 10*3/uL (ref 0.1–1.0)
Monocytes Relative: 9 %
Neutro Abs: 3.3 10*3/uL (ref 1.7–7.7)
Neutrophils Relative %: 57 %
Platelets: 223 10*3/uL (ref 150–400)
RBC: 3.09 MIL/uL — ABNORMAL LOW (ref 3.87–5.11)
RDW: 14.9 % (ref 11.5–15.5)
WBC: 5.9 10*3/uL (ref 4.0–10.5)
nRBC: 0 % (ref 0.0–0.2)

## 2023-02-01 LAB — MAGNESIUM: Magnesium: 1.8 mg/dL (ref 1.7–2.4)

## 2023-02-01 MED ORDER — CETIRIZINE HCL 10 MG/ML IV SOLN
10.0000 mg | Freq: Once | INTRAVENOUS | Status: AC
  Administered 2023-02-01: 10 mg via INTRAVENOUS
  Filled 2023-02-01: qty 1

## 2023-02-01 MED ORDER — DEXTROSE 5 % IV SOLN: Freq: Once | INTRAVENOUS | Status: AC

## 2023-02-01 MED ORDER — SODIUM CHLORIDE 0.9 % IV SOLN
150.0000 mg | Freq: Once | INTRAVENOUS | Status: AC
  Administered 2023-02-01: 150 mg via INTRAVENOUS
  Filled 2023-02-01: qty 5

## 2023-02-01 MED ORDER — ACETAMINOPHEN 325 MG PO TABS
650.0000 mg | ORAL_TABLET | Freq: Once | ORAL | Status: AC
  Administered 2023-02-01: 650 mg via ORAL
  Filled 2023-02-01: qty 2

## 2023-02-01 MED ORDER — PALONOSETRON HCL INJECTION 0.25 MG/5ML
0.2500 mg | Freq: Once | INTRAVENOUS | Status: AC
  Administered 2023-02-01: 0.25 mg via INTRAVENOUS
  Filled 2023-02-01: qty 5

## 2023-02-01 MED ORDER — HEPARIN SOD (PORK) LOCK FLUSH 100 UNIT/ML IV SOLN
500.0000 [IU] | Freq: Once | INTRAVENOUS | Status: AC | PRN
  Administered 2023-02-01: 500 [IU]

## 2023-02-01 MED ORDER — SODIUM CHLORIDE 0.9% FLUSH
10.0000 mL | INTRAVENOUS | Status: DC | PRN
  Administered 2023-02-01: 10 mL

## 2023-02-01 MED ORDER — FAM-TRASTUZUMAB DERUXTECAN-NXKI CHEMO 100 MG IV SOLR
4.4000 mg/kg | Freq: Once | INTRAVENOUS | Status: AC
  Administered 2023-02-01: 300 mg via INTRAVENOUS
  Filled 2023-02-01: qty 15

## 2023-02-01 MED ORDER — SODIUM CHLORIDE 0.9% FLUSH
10.0000 mL | Freq: Once | INTRAVENOUS | Status: AC
  Administered 2023-02-01: 10 mL via INTRAVENOUS

## 2023-02-01 MED ORDER — SODIUM CHLORIDE 0.9 % IV SOLN
10.0000 mg | Freq: Once | INTRAVENOUS | Status: AC
  Administered 2023-02-01: 10 mg via INTRAVENOUS
  Filled 2023-02-01: qty 10

## 2023-02-01 NOTE — Progress Notes (Signed)
Patient presents today for Enhertu infusion per providers order.  Vital signs and labs reviewed by MD.  Message received from Allison Anderson RN/Dr. Katragadda patient okay for treatment.  Treatment given today per MD orders.  Stable during infusion without adverse affects.  Vital signs stable.  No complaints at this time.  Discharge from clinic ambulatory in stable condition.  Alert and oriented X 3.  Follow up with [facility] as scheduled.  

## 2023-02-01 NOTE — Patient Instructions (Signed)
[facility]  Discharge Instructions: Thank you for choosing [facility] to provide your oncology and hematology care.  If you have a lab appointment with the Cancer Center - please note that after April 8th, 2024, all labs will be drawn in the cancer center.  You do not have to check in or register with the main entrance as you have in the past but will complete your check-in in the cancer center.  Wear comfortable clothing and clothing appropriate for easy access to any Portacath or PICC line.   We strive to give you quality time with your provider. You may need to reschedule your appointment if you arrive late (15 or more minutes).  Arriving late affects you and other patients whose appointments are after yours.  Also, if you miss three or more appointments without notifying the office, you may be dismissed from the clinic at the provider's discretion.      For prescription refill requests, have your pharmacy contact our office and allow 72 hours for refills to be completed.    Today you received the following chemotherapy and/or immunotherapy agents Enhertu      To help prevent nausea and vomiting after your treatment, we encourage you to take your nausea medication as directed.  BELOW ARE SYMPTOMS THAT SHOULD BE REPORTED IMMEDIATELY: *FEVER GREATER THAN 100.4 F (38 C) OR HIGHER *CHILLS OR SWEATING *NAUSEA AND VOMITING THAT IS NOT CONTROLLED WITH YOUR NAUSEA MEDICATION *UNUSUAL SHORTNESS OF BREATH *UNUSUAL BRUISING OR BLEEDING *URINARY PROBLEMS (pain or burning when urinating, or frequent urination) *BOWEL PROBLEMS (unusual diarrhea, constipation, pain near the anus) TENDERNESS IN MOUTH AND THROAT WITH OR WITHOUT PRESENCE OF ULCERS (sore throat, sores in mouth, or a toothache) UNUSUAL RASH, SWELLING OR PAIN  UNUSUAL VAGINAL DISCHARGE OR ITCHING   Items with * indicate a potential emergency and should be followed up as soon as possible or go to the  Emergency Department if any problems should occur.  Please show the CHEMOTHERAPY ALERT CARD or IMMUNOTHERAPY ALERT CARD at check-in to the Emergency Department and triage nurse.  Should you have questions after your visit or need to cancel or reschedule your appointment, please contact [facility] [phone]  and follow the prompts.  Office hours are 8:00 a.m. to 4:30 p.m. Monday - Friday. Please note that voicemails left after 4:00 p.m. may not be returned until the following business day.  We are closed weekends and major holidays. You have access to a nurse at all times for urgent questions. Please call the main number to the clinic [phone] and follow the prompts.  For any non-urgent questions, you may also contact your provider using MyChart. We now offer e-Visits for anyone 18 and older to request care online for non-urgent symptoms. For details visit [email or web address].   Also download the MyChart app! Go to the app store, search "MyChart", open the app, select , and log in with your MyChart username and password.   

## 2023-02-01 NOTE — Patient Instructions (Addendum)
[facility] at [facility] Discharge Instructions   You were seen and examined today by Dr. Ellin Saba.  He reviewed the results of your lab work which are normal/stable.   He reviewed the results of your scan which was stable. No new cancer was seen nor did any known cancer areas get bigger.   We will proceed with your treatment today.   Return as scheduled.    Thank you for choosing [facility] at [facility] to provide your oncology and hematology care.  To afford each patient quality time with our provider, please arrive at least 15 minutes before your scheduled appointment time.   If you have a lab appointment with the Cancer Center please come in thru the Main Entrance and check in at the main information desk.  You need to re-schedule your appointment should you arrive 10 or more minutes late.  We strive to give you quality time with our providers, and arriving late affects you and other patients whose appointments are after yours.  Also, if you no show three or more times for appointments you may be dismissed from the clinic at the providers discretion.     Again, thank you for choosing [facility].  Our hope is that these requests will decrease the amount of time that you wait before being seen by our physicians.       _____________________________________________________________  Should you have questions after your visit to [facility], please contact our office at [phone] and follow the prompts.  Our office hours are 8:00 a.m. and 4:30 p.m. Monday - Friday.  Please note that voicemails left after 4:00 p.m. may not be returned until the following business day.  We are closed weekends and major holidays.  You do have access to a nurse 24-7, just call the main number to the clinic [phone] and do not press any options, hold on the line and a nurse will answer the phone.    For prescription refill  requests, have your pharmacy contact our office and allow 72 hours.    Due to Covid, you will need to wear a mask upon entering the hospital. If you do not have a mask, a mask will be given to you at the Main Entrance upon arrival. For doctor visits, patients may have 1 support person age 45 or older with them. For treatment visits, patients can not have anyone with them due to social distancing guidelines and our immunocompromised population.

## 2023-02-01 NOTE — Progress Notes (Signed)
Patient has been examined by Dr. Katragadda. Vital signs and labs have been reviewed by MD - ANC, Creatinine, LFTs, hemoglobin, and platelets are within treatment parameters per M.D. - pt may proceed with treatment.  Primary RN and pharmacy notified.  

## 2023-02-01 NOTE — Progress Notes (Signed)
Maintain dose at 300 mg despite slight weight increase.  T.O. Dr Katragadda/Wilkins Elpers, PharmD 

## 2023-02-01 NOTE — Progress Notes (Signed)
Denise [name] [address] [place] [address]    Clinic Day:  02/01/2023  Referring physician: Toma Deiters, MD  Patient Care Team: Denise Deiters, MD as PCP - General (Internal Medicine) Denise Massed, MD as Medical Oncologist (Medical Oncology)   ASSESSMENT & PLAN:   Assessment: 1.  T3N3c (stage IIIc) triple negative invasive lobular carcinoma of the left breast: - She felt lump in her left breast for more than 6 months, but thought it was secondary to fibrocystic disease which she had all her life.  When she started having pain, she reached out to Dr. Olena [name]. - Ultrasound-guided left breast and left axillary lymph node biopsy on 01/15/2021 - Pathology consistent with invasive lobular carcinoma, E-cadherin negative.  ER/PR/HER2 negative.  HER2 2+ by IHC, negative by FISH.  Ki-67 is 5%.  Lymph node core biopsy was consistent with metastatic carcinoma.  Grade 2. - PET scan on 02/03/2021 showed involvement of left supraclavicular, subpectoral, axillary lymph nodes along with the breast mass.  10 mm left lung nodule which is hypometabolic.  Spinal cord lesion at T12 level with a strong uptake. - MRI of the lumbar spine with and without contrast on 02/20/2021 showed no mass or abnormal enhancement within the canal at the T12 level to correspond to the site of PET scan positive.  No marrow replacing bone lesion. - 12 weeks of carboplatin and paclitaxel weekly and every 3 weeks pembrolizumab followed by 4 cycles of dose dense AC with pembrolizumab ([id number]) from 02/27/2021 through 10/01/2021 - PET scan on 10/16/2021: Reduction in metabolic activity in the size of the left axillary lymph node and no evidence of breast hypermetabolism on PET scan. - Left mastectomy and lymph node excision by Dr. Magnus [name] on 11/20/2021 - Pathology: 6.2 cm invasive lobular carcinoma, grade 2, margins negative.  19/21 lymph nodes involved with macrometastasis.  ypT3, YPN3A - Adjuvant  pembrolizumab started on 01/01/2022. -CREATE-X capecitabine 1500 mg twice daily 2 weeks on/1 week off started on 01/01/2022.  Last dose of pembrolizumab on 05/11/2022 - Germline mutation testing ([facility]) negative - [employer] (06/04/2022): T p53, K-ras V14 I, CDKN2A.  MSI-high not detected. - PET scan (05/28/2022): Multifocal bone lesions involving T11 vertebral body, right posterior sixth rib, left femoral neck, left scapular glenoid.  No other metastatic disease. - Sacituzumab cycle 1 on 08/18/2022.  She was hospitalized with neutropenic fever and severe diarrhea from 08/24/2022 through 08/27/2022. - CTAP (10/25/2021): Generalized mild to moderate wall thickening throughout the nondistended large bowel with faint pericolonic fat haziness compatible with nonspecific infectious/inflammatory colitis.  No free air.  C. difficile x 2 was negative. - She has UG T1 A1*28 heterozygosity. - Cycle 2 Sacituzumab dose reduced by 50%.  Discontinued secondary to poor tolerance. - Denise [name] 3.2 Mg/KG cycle 1 on 11/04/2022   2.  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  She is non-smoker. - Mother died of breast cancer.  Maternal grandmother died very young in her 30s, sister has fibrocystic disease.  Father died of lung cancer and was a smoker.    Plan: 1.  Metastatic TNBC to the bones: - After last treatment, she did not experience any diarrhea.  No signs of PND or orthopnea. - She ate something at a potluck dinner at discharge and felt better after that.  She also had low back spasms for 3 to 4 days after Denise [name] injection. - CT CAP (01/29/2023): Widespread bone metastatic  disease is stable.  Left iliac wing lytic lesion is more well-defined on today's exam.  No evidence of extraosseous metastatic disease. - Tumor markers (12/21/2022): CA 15-3 improved to 24 from 27. - She is tolerating Denise [name] very well.  I reviewed labs today which showed mildly elevated  alk phos of 134.  Creatinine and other LFTs are normal.  CBC grossly normal. - She may proceed with treatment today.  RTC 3 weeks for follow-up.  2.  Brain lesions (SRS to 3 brain lesions on 06/16/2022): - MRI of the brain on 10/23/2022: Stable to interval decrease in size of previously treated lesions.  3.  Peripheral neuropathy: - Continue gabapentin 600 mg at bedtime which is helping.  4.  Hypomagnesemia: - Continue OTC magnesium supplements.  Magnesium is normal today.    Orders Placed This Encounter  Procedures   Magnesium    Standing Status:   Future    Standing Expiration Date:   03/15/2024   CBC with Differential    Standing Status:   Future    Standing Expiration Date:   03/16/2024   Comprehensive metabolic panel    Standing Status:   Future    Standing Expiration Date:   03/16/2024   Magnesium    Standing Status:   Future    Standing Expiration Date:   04/05/2024   CBC with Differential    Standing Status:   Future    Standing Expiration Date:   04/06/2024   Comprehensive metabolic panel    Standing Status:   Future    Standing Expiration Date:   04/06/2024      I,Denise [name],acting as a scribe for Denise Massed, MD.,have documented all relevant documentation on the behalf of Denise Massed, MD,as directed by  Denise Massed, MD while in the presence of Denise Massed, MD.   I, Denise Massed MD, have reviewed the above documentation for accuracy and completeness, and I agree with the above.   Denise Massed, MD   5/20/20242:28 PM  CHIEF COMPLAINT:   Diagnosis: Metastatic triple negative breast cancer to the bones    Cancer Staging  Invasive lobular carcinoma of left breast in female [facility]) Staging form: Breast, AJCC 8th Edition - Clinical stage from 01/23/2021: cT3, cN3c, G2, ER-, PR-, HER2- - Unsigned - Pathologic stage from 12/22/2021: No Stage Recommended (ypT3, pN3a, cM0, G2, ER-, PR-, HER2-) - Signed by Denise Massed,  MD on 12/22/2021    Prior Therapy: 1.  Neoadjuvant chemotherapy with weekly carboplatin and paclitaxel and dose dense AC with pembrolizumab 2. Left mastectomy and lymph node excision by Dr. Magnus [name] on 11/20/2021 3.  Sacituzumab on 08/18/2022 and 10/08/2021, discontinued due to poor tolerance.  Current Therapy:  Denise [name] every 21 days    HISTORY OF PRESENT ILLNESS:   Oncology History  Invasive lobular carcinoma of left breast in female [name])  01/23/2021 Initial Diagnosis   Invasive lobular carcinoma of left breast in female [name])   02/19/2021 Genetic Testing   Negative genetic testing on the CancerNext-Expanded+RNAinsight panel.  SMARCB1 VUS identified.  The CancerNext-Expanded gene panel offered by [name] and includes sequencing and rearrangement analysis for the following 77 genes: AIP, ALK, APC*, ATM*, AXIN2, BAP1, BARD1, BLM, BMPR1A, BRCA1*, BRCA2*, BRIP1*, CDC73, CDH1*, CDK4, CDKN1B, CDKN2A, CHEK2*, CTNNA1, DICER1, FANCC, FH, FLCN, GALNT12, KIF1B, LZTR1, MAX, MEN1, MET, MLH1*, MSH2*, MSH3, MSH6*, MUTYH*, NBN, NF1*, NF2, NTHL1, PALB2*, PHOX2B, PMS2*, POT1, PRKAR1A, PTCH1, PTEN*, RAD51C*, RAD51D*, RB1, RECQL, RET, SDHA, SDHAF2, SDHB, SDHC, SDHD, SMAD4, SMARCA4, SMARCB1, SMARCE1, STK11,  SUFU, TMEM127, TP53*, TSC1, TSC2, VHL and XRCC2 (sequencing and deletion/duplication); EGFR, EGLN1, HOXB13, KIT, MITF, PDGFRA, POLD1, and POLE (sequencing only); EPCAM and GREM1 (deletion/duplication only). DNA and RNA analyses performed for * genes. The report date is February 19, 2021.   02/27/2021 - 05/11/2022 Chemotherapy   Patient is on Treatment Plan : BREAST Pembrolizumab + Carboplatin D1,8,15+ Paclitaxel D1,8,15 q21d X 4 cycles / Pembrolizumab + AC q21d x 4 cycles     12/22/2021 Cancer Staging   Staging form: Breast, AJCC 8th Edition - Pathologic stage from 12/22/2021: No Stage Recommended (ypT3, pN3a, cM0, G2, ER-, PR-, HER2-) - Signed by Denise Massed, MD on 12/22/2021 Histopathologic type: Lobular  carcinoma, NOS Stage prefix: Post-therapy Method of lymph node assessment: Axillary lymph node dissection Multigene prognostic tests performed: None Histologic grading system: 3 grade system   08/18/2022 - 10/08/2022 Chemotherapy   Patient is on Treatment Plan : BREAST METASTATIC Sacituzumab govitecan-hziy Drinda Butts) D1,8 q21d     11/04/2022 -  Chemotherapy   Patient is on Treatment Plan : BREAST METASTATIC Fam-Trastuzumab Deruxtecan-nxki (Denise [name]) (5.4) q21d        INTERVAL HISTORY:   Denise [name] is a 72 y.o. female presenting to clinic today for follow up of Metastatic triple negative breast cancer to the bones. She was last seen by me on 01/11/23.  Since her last visit, she underwent restaging CT C/A/P on 01/29/23 showing: mostly similar widespread osseous metastasis, new or more well-defined 1.6 cm left iliac wing lytic lesions; no evidence of extraosseous metastasis.  Today, she states that she is doing well overall. Her appetite level is at 100%. Her energy level is at 75%.  PAST MEDICAL HISTORY:   Past Medical History: Past Medical History:  Diagnosis Date   Asthma    as child   Cancer (HCC) 12/2020   left breast IMC   Complication of anesthesia    pateitn states,' I coded when I had my D&Cmany years ago.   Dyspnea    Family history of breast cancer    Hypertension    Hypothyroidism    PONV (postoperative nausea and vomiting)    Port-A-Cath in place 02/26/2021    Surgical History: Past Surgical History:  Procedure Laterality Date   ABDOMINAL HYSTERECTOMY     BIOPSY  01/17/2018   Procedure: BIOPSY;  Surgeon: Malissa Hippo, MD;  [facility];  Service: Endoscopy;;  duodenum,gastric   CHOLECYSTECTOMY     COLONOSCOPY WITH PROPOFOL N/A 01/17/2018   Procedure: COLONOSCOPY WITH PROPOFOL;  Surgeon: Malissa Hippo, MD;  [facility];  Service: Endoscopy;  Laterality: N/A;  7:30   DILATION AND CURETTAGE OF UTERUS     ESOPHAGOGASTRODUODENOSCOPY (EGD) WITH  PROPOFOL N/A 01/17/2018   Procedure: ESOPHAGOGASTRODUODENOSCOPY (EGD) WITH PROPOFOL;  Surgeon: Malissa Hippo, MD;  [facility];  Service: Endoscopy;  Laterality: N/A;   POLYPECTOMY  01/17/2018   Procedure: POLYPECTOMY;  Surgeon: Malissa Hippo, MD;  [facility];  Service: Endoscopy;;  transverse colon, cecal   PORTACATH PLACEMENT Right 02/17/2021   Procedure: INSERTION PORT-A-CATH;  Surgeon: Abigail Miyamoto, MD;  Location[name];  Service: General;  Laterality: Right;   RADIOACTIVE SEED GUIDED AXILLARY SENTINEL LYMPH NODE Left 11/20/2021   Procedure: RADIOACTIVE SEED GUIDED LEFT AXILLARY SENTINEL LYMPH NODE DISSECTION;  Surgeon: Abigail Miyamoto, MD;  Location: [name];  Service: General;  Laterality: Left;   TOTAL MASTECTOMY Left 11/20/2021   Procedure: LEFT TOTAL MASTECTOMY;  Surgeon:  Abigail Miyamoto, MD;  Location: [name];  Service: General;  Laterality: Left;    Social History: Social History   Socioeconomic History   Marital status: Widowed    Spouse name: Not on file   Number of children: 3   Years of education: Not on file   Highest education level: Not on file  Occupational History   Occupation: [employer] [name] [employer]   Occupation: retired  Tobacco Use   Smoking status: Never   Smokeless tobacco: Never  Vaping Use   Vaping Use: Never used  Substance and Sexual Activity   Alcohol use: Never   Drug use: Never   Sexual activity: Not Currently    Birth control/protection: Surgical  Other Topics Concern   Not on file  Social History Narrative   Not on file   Social Determinants of Health   Financial Resource Strain: Low Risk  (07/10/2022)   Overall Financial Resource Strain (CARDIA)    Difficulty of Paying Living Expenses: Not hard at all  Food Insecurity: No Food Insecurity (06/30/2022)   Hunger Vital Sign    Worried About Running Out of Food in the Last Year: Never true    Ran Out  of Food in the Last Year: Never true  Transportation Needs: No Transportation Needs (07/10/2022)   PRAPARE - Administrator, Civil Service (Medical): No    Lack of Transportation (Non-Medical): No  Physical Activity: Insufficiently Active (01/22/2021)   Exercise Vital Sign    Days of Exercise per Week: 3 days    Minutes of Exercise per Session: 30 min  Stress: No Stress Concern Present (01/22/2021)   [employer] of Occupational Health - Occupational Stress Questionnaire    Feeling of Stress : Not at all  Social Connections: Moderately Integrated (01/22/2021)   Social Connection and Isolation Panel [NHANES]    Frequency of Communication with Friends and Family: More than three times a week    Frequency of Social Gatherings with Friends and Family: More than three times a week    Attends Religious Services: More than 4 times per year    Active Member of [employer] or Organizations: No    Attends Engineer, structural: More than 4 times per year    Marital Status: Widowed  Intimate Partner Violence: Not At Risk (05/28/2022)   Humiliation, Afraid, Rape, and Kick questionnaire    Fear of Current or Ex-Partner: No    Emotionally Abused: No    Physically Abused: No    Sexually Abused: No    Family History: Family History  Problem Relation Age of Onset   Breast cancer Mother        dx in her 20s   Heart disease Mother    Thyroid disease Mother    Lung cancer Father        dx in his 92s   Thyroid disease Maternal Aunt    Thyroid nodules Maternal Grandmother 35       goiter   Thyroid disease Maternal Grandmother    Heart disease Maternal Grandfather    Heart disease Paternal Grandmother    Heart disease Paternal Grandfather    Thyroid disease Daughter    Thyroid disease Daughter    Cancer Maternal Uncle        NOS   Cancer Paternal Uncle        NOS   Breast cancer Cousin        pat first cousin died in her 78s;  Current Medications:  Current Outpatient  Medications:    Acetaminophen (TYLENOL PO), Take 1 tablet by mouth as needed., Disp: , Rfl:    amoxicillin-clavulanate (AUGMENTIN) 875-125 MG tablet, Take 1 tablet by mouth 2 (two) times daily., Disp: 14 tablet, Rfl: 0   gabapentin (NEURONTIN) 300 MG capsule, Take 1 capsule by mouth 2 (two) times daily., Disp: , Rfl:    hydrALAZINE (APRESOLINE) 25 MG tablet, Take 25 mg by mouth 3 (three) times daily., Disp: , Rfl:    levothyroxine (SYNTHROID) 75 MCG tablet, Take 100 mcg by mouth daily before breakfast., Disp: , Rfl:    Melatonin 5 MG TABS, Take 10 mg by mouth. As needed for sleep, Disp: , Rfl:    olmesartan-hydrochlorothiazide (BENICAR HCT) 40-25 MG tablet, Take 1 tablet by mouth daily., Disp: , Rfl:    pantoprazole (PROTONIX) 40 MG tablet, Take 40 mg by mouth daily., Disp: , Rfl:    predniSONE (DELTASONE) 50 MG tablet, Take one tab po 13 hours prior, then one tab 7 hours prior, and then one tab 1 hour prior to MRI, Disp: 3 tablet, Rfl: 0 No current facility-administered medications for this visit.  Facility-Administered Medications Ordered in Other Visits:    fam-trastuzumab deruxtecan-nxki (Denise [name]) 300 mg in dextrose 5 % 100 mL chemo infusion, 4.4 mg/kg (Treatment Plan Recorded), Intravenous, Once, Denise Massed, MD   heparin lock flush 100 unit/mL, 500 Units, Intracatheter, Once PRN, Denise Massed, MD   sodium chloride flush (NS) 0.9 % injection 10 mL, 10 mL, Intracatheter, PRN, Denise Massed, MD   Allergies: Allergies  Allergen Reactions   Exforge [Amlodipine Besylate-Valsartan] Nausea And Vomiting   Gadolinium Derivatives Hives, Itching and Anaphylaxis    13hr prep prior to contrast admin  MRI iv contrast   Gadopiclenol Hives and Itching    13 hr prep prior to contrast admin    Tape Other (See Comments)    Redness and Irriatation   Cheese     Hard cheese   Levofloxacin In D5w Hives   Other    Proanthocyanidin    Strawberry Extract Diarrhea    Seeds,nuts,  lettuce, grapes   Wild Lettuce Extract (Lactuca Virosa)    Yeast-Related Products Hives    Mold on bread    REVIEW OF SYSTEMS:   Review of Systems  Constitutional:  Negative for chills, fatigue and fever.  HENT:   Negative for lump/mass, mouth sores, nosebleeds, sore throat and trouble swallowing.   Eyes:  Negative for eye problems.  Respiratory:  Positive for shortness of breath. Negative for cough.   Cardiovascular:  Negative for chest pain, leg swelling and palpitations.  Gastrointestinal:  Positive for constipation and diarrhea. Negative for abdominal pain, nausea and vomiting.  Genitourinary:  Negative for bladder incontinence, difficulty urinating, dysuria, frequency, hematuria and nocturia.   Musculoskeletal:  Negative for arthralgias, back pain, flank pain, myalgias and neck pain.  Skin:  Negative for itching and rash.  Neurological:  Positive for headaches and numbness. Negative for dizziness.  Hematological:  Does not bruise/bleed easily.  Psychiatric/Behavioral:  Negative for depression, sleep disturbance and suicidal ideas. The patient is not nervous/anxious.   All other systems reviewed and are negative.    VITALS:   There were no vitals taken for this visit.  Wt Readings from Last 3 Encounters:  02/01/23 171 lb (77.6 kg)  01/11/23 167 lb 6.4 oz (75.9 kg)  12/21/22 168 lb 9.6 oz (76.5 kg)    There is no height or weight on file to  calculate BMI.  Performance status (ECOG): 1 - Symptomatic but completely ambulatory  PHYSICAL EXAM:   Physical Exam Vitals and nursing note reviewed. Exam conducted with a chaperone present.  Constitutional:      Appearance: Normal appearance.  Cardiovascular:     Rate and Rhythm: Normal rate and regular rhythm.     Pulses: Normal pulses.     Heart sounds: Normal heart sounds.  Pulmonary:     Effort: Pulmonary effort is normal.     Breath sounds: Normal breath sounds.  Abdominal:     Palpations: Abdomen is soft. There is no  hepatomegaly, splenomegaly or mass.     Tenderness: There is no abdominal tenderness.  Musculoskeletal:     Right lower leg: No edema.     Left lower leg: No edema.  Lymphadenopathy:     Cervical: No cervical adenopathy.     Right cervical: No superficial, deep or posterior cervical adenopathy.    Left cervical: No superficial, deep or posterior cervical adenopathy.     Upper Body:     Right upper body: No supraclavicular or axillary adenopathy.     Left upper body: No supraclavicular or axillary adenopathy.  Neurological:     General: No focal deficit present.     Mental Status: She is alert and oriented to person, place, and time.  Psychiatric:        Mood and Affect: Mood normal.        Behavior: Behavior normal.     LABS:      Latest Ref Rng & Units 02/01/2023   11:54 AM 01/11/2023   11:56 AM 12/21/2022    9:37 AM  CBC  WBC 4.0 - 10.5 K/uL 5.9  5.1  3.6   Hemoglobin 12.0 - 15.0 g/dL 69.6  29.5  28.4   Hematocrit 36.0 - 46.0 % 32.9  33.8  31.2   Platelets 150 - 400 K/uL 223  251  214       Latest Ref Rng & Units 02/01/2023   11:54 AM 01/29/2023   12:22 PM 01/11/2023   11:56 AM  CMP  Glucose 70 - 99 mg/dL 132   440   BUN 8 - 23 mg/dL 24   22   Creatinine 1.02 - 1.00 mg/dL 7.25  3.66  4.40   Sodium 135 - 145 mmol/L 137   136   Potassium 3.5 - 5.1 mmol/L 3.8   3.7   Chloride 98 - 111 mmol/L 106   101   CO2 22 - 32 mmol/L 24   25   Calcium 8.9 - 10.3 mg/dL 8.6   9.0   Total Protein 6.5 - 8.1 g/dL 6.4   7.0   Total Bilirubin 0.3 - 1.2 mg/dL 0.5   0.5   Alkaline Phos 38 - 126 U/L 134   128   AST 15 - 41 U/L 17   17   ALT 0 - 44 U/L 14   12      No results found for: "CEA1", "CEA" / No results found for: "CEA1", "CEA" No results found for: "PSA1" No results found for: "HKV425" No results found for: "CAN125"  No results found for: "TOTALPROTELP", "ALBUMINELP", "A1GS", "A2GS", "BETS", "BETA2SER", "GAMS", "MSPIKE", "SPEI" No results found for: "TIBC", "FERRITIN",  "IRONPCTSAT" No results found for: "LDH"   STUDIES:   CT CHEST ABDOMEN PELVIS W CONTRAST  Result Date: 01/30/2023 CLINICAL DATA:  Breast cancer. Brain metastasis. On chemotherapy. * Tracking Code: BO * EXAM: CT CHEST,  ABDOMEN, AND PELVIS WITH CONTRAST TECHNIQUE: Multidetector CT imaging of the chest, abdomen and pelvis was performed following the standard protocol during bolus administration of intravenous contrast. RADIATION DOSE REDUCTION: This exam was performed according to the departmental dose-optimization program which includes automated exposure control, adjustment of the mA and/or kV according to patient size and/or use of iterative reconstruction technique. CONTRAST:  OMNIPAQUE IOHEXOL 300 MG/ML  SOLN COMPARISON:  11/02/2022 FINDINGS: CT CHEST FINDINGS Cardiovascular: Right Port-A-Cath tip high right atrium. Aortic atherosclerosis. Tortuous thoracic aorta. Normal heart size, without pericardial effusion. No central pulmonary embolism, on this non-dedicated study. Mediastinum/Nodes: Small low left jugular/supraclavicular nodes are similar and nonspecific. Left axillary node dissection. No axillary adenopathy. No mediastinal or hilar adenopathy. Calcified left hilar nodes are likely due to old granulomatous disease. No internal mammary adenopathy. Lungs/Pleura: No pleural fluid. Left upper lobe calcified granuloma. Clustered left lower lobe pulmonary nodules including on 109/4 are similar and likely related to mucoid impaction from remote infection/inflammation. Index nodule measures 6 mm today and is similar. Partially calcified left lower lobe nodule of 11 x 9 mm on 81/4 is similar to 12 x 9 mm on the prior, favored to represent a benign hamartoma or granuloma. Other scattered millimetric nodules are similar and identified on series 4. Example posteromedial right upper lobe at 5 mm on 59/4. Musculoskeletal: Left mastectomy. Heterogeneity and mild expansion of the posterior right sixth rib  similar on 25/3. T11 sclerotic lesion is similar. T8 lytic lesion including at 1.5 cm is similar on 115 sagittal. CT ABDOMEN PELVIS FINDINGS Hepatobiliary: Normal liver. Cholecystectomy, without biliary ductal dilatation. Pancreas: Normal, without mass or ductal dilatation. Spleen: Normal in size, without focal abnormality. Adrenals/Urinary Tract: Normal adrenal glands. Bilateral too small to characterize renal lesions there are most likely cysts . In the absence of clinically indicated signs/symptoms require(s) no independent follow-up. No hydronephrosis. Normal urinary bladder. Stomach/Bowel: Normal stomach, without wall thickening. Scattered colonic diverticula. Colonic stool burden suggests constipation. Normal terminal ileum. Normal small bowel. Vascular/Lymphatic: Aortic atherosclerosis. No abdominopelvic adenopathy. Reproductive: Hysterectomy.  No adnexal mass. Other: No significant free fluid. Mild pelvic floor laxity. No evidence of omental or peritoneal disease. Musculoskeletal: New or more well-defined lytic lesion within the left iliac at 1.6 cm on 83/3. Right iliac subcentimeter lytic lesion on 85/3 is unchanged. IMPRESSION: 1. Widespread osseous metastasis, primarily similar. A left iliac wing 1.6 cm lytic lesion is new or more well-defined today, suggesting mild disease progression. 2. No evidence of extraosseous metastasis. 3. Similar pulmonary nodules, favored to be benign. Clustered left lower lobe nodules are likely postinfectious/inflammatory. A dominant left lower lobe nodule likely represents a benign hamartoma or granuloma. 4. Incidental findings, including: Possible constipation. Aortic Atherosclerosis (ICD10-I70.0). Electronically Signed   By: Jeronimo Greaves M.D.   On: 01/30/2023 17:10   DG Chest 2 View  Result Date: 01/04/2023 CLINICAL DATA:  URI symptoms, chest tightness, shortness of breath, and cough for 2 weeks, cancer patient on chemotherapy, history asthma EXAM: CHEST - 2 VIEW  COMPARISON:  01/29/2022 FINDINGS: RIGHT subclavian Port-A-Cath with tip projecting over SVC. Upper normal size of cardiac silhouette. Mediastinal contours and pulmonary vascularity normal. Lungs appear emphysematous but clear. Calcified granuloma LEFT upper lobe stable. Again identified nodular density mid LEFT lung laterally 13 x 9 mm corresponding to noncalcified pulmonary nodule on prior CT. No acute pulmonary infiltrate, pleural effusion, or pneumothorax. Diffuse osseous demineralization. Post LEFT mastectomy and LEFT axillary lymph node dissection. IMPRESSION: Stable LEFT midlung nodule and calcified LEFT  upper lobe granuloma. No acute abnormalities. Electronically Signed   By: Ulyses Southward M.D.   On: 01/04/2023 14:39

## 2023-02-03 ENCOUNTER — Inpatient Hospital Stay: Payer: Medicare Other

## 2023-02-03 VITALS — BP 111/65 | HR 69 | Temp 98.2°F | Resp 18

## 2023-02-03 DIAGNOSIS — Z5112 Encounter for antineoplastic immunotherapy: Secondary | ICD-10-CM | POA: Diagnosis not present

## 2023-02-03 DIAGNOSIS — Z95828 Presence of other vascular implants and grafts: Secondary | ICD-10-CM

## 2023-02-03 DIAGNOSIS — R748 Abnormal levels of other serum enzymes: Secondary | ICD-10-CM | POA: Diagnosis not present

## 2023-02-03 DIAGNOSIS — G629 Polyneuropathy, unspecified: Secondary | ICD-10-CM | POA: Diagnosis not present

## 2023-02-03 DIAGNOSIS — C50912 Malignant neoplasm of unspecified site of left female breast: Secondary | ICD-10-CM

## 2023-02-03 DIAGNOSIS — C7931 Secondary malignant neoplasm of brain: Secondary | ICD-10-CM | POA: Diagnosis not present

## 2023-02-03 DIAGNOSIS — C7951 Secondary malignant neoplasm of bone: Secondary | ICD-10-CM | POA: Diagnosis not present

## 2023-02-03 MED ORDER — PEGFILGRASTIM-FPGK 6 MG/0.6ML ~~LOC~~ SOSY
6.0000 mg | PREFILLED_SYRINGE | Freq: Once | SUBCUTANEOUS | Status: AC
  Administered 2023-02-03: 6 mg via SUBCUTANEOUS
  Filled 2023-02-03: qty 0.6

## 2023-02-03 NOTE — Progress Notes (Signed)
Patient tolerated injection with no complaints voiced. Site clean and dry with no bruising or swelling noted at site. See MAR for details. Band aid applied.  Patient stable during and after injection. VSS with discharge and left in satisfactory condition with no s/s of distress noted.  

## 2023-02-03 NOTE — Patient Instructions (Signed)
[facility]  Discharge Instructions: Thank you for choosing [facility] to provide your oncology and hematology care.  If you have a lab appointment with the Cancer Center - please note that after April 8th, 2024, all labs will be drawn in the cancer center.  You do not have to check in or register with the main entrance as you have in the past but will complete your check-in in the cancer center.  Wear comfortable clothing and clothing appropriate for easy access to any Portacath or PICC line.   We strive to give you quality time with your provider. You may need to reschedule your appointment if you arrive late (15 or more minutes).  Arriving late affects you and other patients whose appointments are after yours.  Also, if you miss three or more appointments without notifying the office, you may be dismissed from the clinic at the provider's discretion.      For prescription refill requests, have your pharmacy contact our office and allow 72 hours for refills to be completed.    Today you received the following c    To help prevent nausea and vomiting after your treatment, we encourage you to take your nausea medication as directed.  BELOW ARE SYMPTOMS THAT SHOULD BE REPORTED IMMEDIATELY: *FEVER GREATER THAN 100.4 F (38 C) OR HIGHER *CHILLS OR SWEATING *NAUSEA AND VOMITING THAT IS NOT CONTROLLED WITH YOUR NAUSEA MEDICATION *UNUSUAL SHORTNESS OF BREATH *UNUSUAL BRUISING OR BLEEDING *URINARY PROBLEMS (pain or burning when urinating, or frequent urination) *BOWEL PROBLEMS (unusual diarrhea, constipation, pain near the anus) TENDERNESS IN MOUTH AND THROAT WITH OR WITHOUT PRESENCE OF ULCERS (sore throat, sores in mouth, or a toothache) UNUSUAL RASH, SWELLING OR PAIN  UNUSUAL VAGINAL DISCHARGE OR ITCHING   Items with * indicate a potential emergency and should be followed up as soon as possible or go to the Emergency Department if any problems should  occur.  Please show the CHEMOTHERAPY ALERT CARD or IMMUNOTHERAPY ALERT CARD at check-in to the Emergency Department and triage nurse.  Should you have questions after your visit or need to cancel or reschedule your appointment, please contact [facility] [phone]  and follow the prompts.  Office hours are 8:00 a.m. to 4:30 p.m. Monday - Friday. Please note that voicemails left after 4:00 p.m. may not be returned until the following business day.  We are closed weekends and major holidays. You have access to a nurse at all times for urgent questions. Please call the main number to the clinic [phone] and follow the prompts.  For any non-urgent questions, you may also contact your provider using MyChart. We now offer e-Visits for anyone 61 and older to request care online for non-urgent symptoms. For details visit [email or web address].   Also download the MyChart app! Go to the app store, search "MyChart", open the app, select [place], and log in with your MyChart username and password.

## 2023-02-10 ENCOUNTER — Telehealth: Payer: Self-pay | Admitting: *Deleted

## 2023-02-10 ENCOUNTER — Inpatient Hospital Stay: Payer: Medicare Other

## 2023-02-10 ENCOUNTER — Other Ambulatory Visit: Payer: Self-pay | Admitting: *Deleted

## 2023-02-10 DIAGNOSIS — C7951 Secondary malignant neoplasm of bone: Secondary | ICD-10-CM | POA: Diagnosis not present

## 2023-02-10 DIAGNOSIS — R300 Dysuria: Secondary | ICD-10-CM

## 2023-02-10 DIAGNOSIS — G629 Polyneuropathy, unspecified: Secondary | ICD-10-CM | POA: Diagnosis not present

## 2023-02-10 DIAGNOSIS — Z5112 Encounter for antineoplastic immunotherapy: Secondary | ICD-10-CM | POA: Diagnosis not present

## 2023-02-10 DIAGNOSIS — R748 Abnormal levels of other serum enzymes: Secondary | ICD-10-CM | POA: Diagnosis not present

## 2023-02-10 DIAGNOSIS — C7931 Secondary malignant neoplasm of brain: Secondary | ICD-10-CM | POA: Diagnosis not present

## 2023-02-10 LAB — URINALYSIS, ROUTINE W REFLEX MICROSCOPIC
Bilirubin Urine: NEGATIVE
Glucose, UA: NEGATIVE mg/dL
Hgb urine dipstick: NEGATIVE
Ketones, ur: NEGATIVE mg/dL
Nitrite: NEGATIVE
Protein, ur: NEGATIVE mg/dL
Specific Gravity, Urine: 1.021 (ref 1.005–1.030)
pH: 5 (ref 5.0–8.0)

## 2023-02-10 NOTE — Telephone Encounter (Signed)
Patient called with symptoms of burning upon urination.  Dr. Ellin Saba aware and recommended a UA.  She will come in this afternoon to provide specimen.

## 2023-02-11 DIAGNOSIS — I10 Essential (primary) hypertension: Secondary | ICD-10-CM | POA: Diagnosis not present

## 2023-02-11 DIAGNOSIS — K219 Gastro-esophageal reflux disease without esophagitis: Secondary | ICD-10-CM | POA: Diagnosis not present

## 2023-02-11 DIAGNOSIS — C50919 Malignant neoplasm of unspecified site of unspecified female breast: Secondary | ICD-10-CM | POA: Diagnosis not present

## 2023-02-11 DIAGNOSIS — I952 Hypotension due to drugs: Secondary | ICD-10-CM | POA: Diagnosis not present

## 2023-02-11 DIAGNOSIS — E038 Other specified hypothyroidism: Secondary | ICD-10-CM | POA: Diagnosis not present

## 2023-02-21 NOTE — Progress Notes (Signed)
[facility] [address] [place] [address]    Clinic Day:  02/21/2023  Referring physician: Toma Deiters, MD  Patient Care Team: Toma Deiters, MD as PCP - General (Internal Medicine) [name], MD as Medical Oncologist (Medical Oncology)   ASSESSMENT & PLAN:   Assessment: 1.  T3N3c (stage IIIc) triple negative invasive lobular carcinoma of the left breast: - She felt lump in her left breast for more than 6 months, but thought it was secondary to fibrocystic disease which she had all her life.  When she started having pain, she reached out to Dr. Olena Leatherwood. - Ultrasound-guided left breast and left axillary lymph node biopsy on 01/15/2021 - Pathology consistent with invasive lobular carcinoma, E-cadherin negative.  ER/PR/HER2 negative.  HER2 2+ by IHC, negative by FISH.  Ki-67 is 5%.  Lymph node core biopsy was consistent with metastatic carcinoma.  Grade 2. - PET scan on 02/03/2021 showed involvement of left supraclavicular, subpectoral, axillary lymph nodes along with the breast mass.  10 mm left lung nodule which is hypometabolic.  Spinal cord lesion at T12 level with a strong uptake. - MRI of the lumbar spine with and without contrast on 02/20/2021 showed no mass or abnormal enhancement within the canal at the T12 level to correspond to the site of PET scan positive.  No marrow replacing bone lesion. - 12 weeks of carboplatin and paclitaxel weekly and every 3 weeks pembrolizumab followed by 4 cycles of dose dense AC with pembrolizumab ([id number]) from 02/27/2021 through 10/01/2021 - PET scan on 10/16/2021: Reduction in metabolic activity in the size of the left axillary lymph node and no evidence of breast hypermetabolism on PET scan. - Left mastectomy and lymph node excision by Dr. Magnus Ivan on 11/20/2021 - Pathology: 6.2 cm invasive lobular carcinoma, grade 2, margins negative.  19/21 lymph nodes involved with macrometastasis.  ypT3, YPN3A - Adjuvant  pembrolizumab started on 01/01/2022. -CREATE-X capecitabine 1500 mg twice daily 2 weeks on/1 week off started on 01/01/2022.  Last dose of pembrolizumab on 05/11/2022 - Germline mutation testing ([facility]) negative - [employer] (06/04/2022): T p53, K-ras V14 I, CDKN2A.  MSI-high not detected. - PET scan (05/28/2022): Multifocal bone lesions involving T11 vertebral body, right posterior sixth rib, left femoral neck, left scapular glenoid.  No other metastatic disease. - Sacituzumab cycle 1 on 08/18/2022.  She was hospitalized with neutropenic fever and severe diarrhea from 08/24/2022 through 08/27/2022. - CTAP (10/25/2021): Generalized mild to moderate wall thickening throughout the nondistended large bowel with faint pericolonic fat haziness compatible with nonspecific infectious/inflammatory colitis.  No free air.  C. difficile x 2 was negative. - She has UG T1 A1*28 heterozygosity. - Cycle 2 Sacituzumab dose reduced by 50%.  Discontinued secondary to poor tolerance. - Enhertu 3.2 Mg/KG cycle 1 on 11/04/2022   2.  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  She is non-smoker. - Mother died of breast cancer.  Maternal grandmother died very young in her 9s, sister has fibrocystic disease.  Father died of lung cancer and was a smoker.    Plan: 1.  Metastatic TNBC to the bones: - After last treatment, she did not experience any diarrhea.  No signs of PND or orthopnea. - She ate something at a potluck dinner at discharge and felt better after that.  She also had low back spasms for 3 to 4 days after Neulasta injection. - CT CAP (01/29/2023): Widespread bone metastatic  disease is stable.  Left iliac wing lytic lesion is more well-defined on today's exam.  No evidence of extraosseous metastatic disease. - Tumor markers (12/21/2022): CA 15-3 improved to 24 from 27. - She is tolerating Enhertu very well.  I reviewed labs today which showed mildly elevated  alk phos of 134.  Creatinine and other LFTs are normal.  CBC grossly normal. - She may proceed with treatment today.  RTC 3 weeks for follow-up.  2.  Brain lesions (SRS to 3 brain lesions on 06/16/2022): - MRI of the brain on 10/23/2022: Stable to interval decrease in size of previously treated lesions.  3.  Peripheral neuropathy: - Continue gabapentin 600 mg at bedtime which is helping.  4.  Hypomagnesemia: - Continue OTC magnesium supplements.  Magnesium is normal today.    No orders of the defined types were placed in this encounter.     I,Katie Daubenspeck,acting as a Neurosurgeon for [name], MD.,have documented all relevant documentation on the behalf of [name], MD,as directed by  [name], MD while in the presence of [name], MD.   ***  [name]   6/9/20249:47 PM  CHIEF COMPLAINT:   Diagnosis: Metastatic triple negative breast cancer to the bones   Cancer Staging  Invasive lobular carcinoma of left breast in female [facility]) Staging form: Breast, AJCC 8th Edition - Clinical stage from 01/23/2021: cT3, cN3c, G2, ER-, PR-, HER2- - Unsigned - Pathologic stage from 12/22/2021: No Stage Recommended (ypT3, pN3a, cM0, G2, ER-, PR-, HER2-) - Signed by [name], MD on 12/22/2021    Prior Therapy: 1.  Neoadjuvant chemotherapy with weekly carboplatin and paclitaxel and dose dense AC with pembrolizumab 2. Left mastectomy and lymph node excision by Dr. Magnus Ivan on 11/20/2021 3.  Sacituzumab on 08/18/2022 and 10/08/2021, discontinued due to poor tolerance.  Current Therapy:  Enhertu every 21 days    HISTORY OF PRESENT ILLNESS:   Oncology History  Invasive lobular carcinoma of left breast in female [facility])  01/23/2021 Initial Diagnosis   Invasive lobular carcinoma of left breast in female [facility])   02/19/2021 Genetic Testing   Negative genetic testing on the CancerNext-Expanded+RNAinsight panel.  SMARCB1 VUS identified.  The  CancerNext-Expanded gene panel offered by [facility] and includes sequencing and rearrangement analysis for the following 77 genes: AIP, ALK, APC*, ATM*, AXIN2, BAP1, BARD1, BLM, BMPR1A, BRCA1*, BRCA2*, BRIP1*, CDC73, CDH1*, CDK4, CDKN1B, CDKN2A, CHEK2*, CTNNA1, DICER1, FANCC, FH, FLCN, GALNT12, KIF1B, LZTR1, MAX, MEN1, MET, MLH1*, MSH2*, MSH3, MSH6*, MUTYH*, NBN, NF1*, NF2, NTHL1, PALB2*, PHOX2B, PMS2*, POT1, PRKAR1A, PTCH1, PTEN*, RAD51C*, RAD51D*, RB1, RECQL, RET, SDHA, SDHAF2, SDHB, SDHC, SDHD, SMAD4, SMARCA4, SMARCB1, SMARCE1, STK11, SUFU, TMEM127, TP53*, TSC1, TSC2, VHL and XRCC2 (sequencing and deletion/duplication); EGFR, EGLN1, HOXB13, KIT, MITF, PDGFRA, POLD1, and POLE (sequencing only); EPCAM and GREM1 (deletion/duplication only). DNA and RNA analyses performed for * genes. The report date is February 19, 2021.   02/27/2021 - 05/11/2022 Chemotherapy   Patient is on Treatment Plan : BREAST Pembrolizumab + Carboplatin D1,8,15+ Paclitaxel D1,8,15 q21d X 4 cycles / Pembrolizumab + AC q21d x 4 cycles     12/22/2021 Cancer Staging   Staging form: Breast, AJCC 8th Edition - Pathologic stage from 12/22/2021: No Stage Recommended (ypT3, pN3a, cM0, G2, ER-, PR-, HER2-) - Signed by [name], MD on 12/22/2021 Histopathologic type: Lobular carcinoma, NOS Stage prefix: Post-therapy Method of lymph node assessment: Axillary lymph node dissection Multigene prognostic tests performed: None Histologic grading system: 3 grade system   08/18/2022 -  10/08/2022 Chemotherapy   Patient is on Treatment Plan : BREAST METASTATIC Sacituzumab govitecan-hziy Drinda Butts) D1,8 q21d     11/04/2022 -  Chemotherapy   Patient is on Treatment Plan : BREAST METASTATIC Fam-Trastuzumab Deruxtecan-nxki (Enhertu) (5.4) q21d        INTERVAL HISTORY:   Denise [name] is a 72 y.o. female presenting to clinic today for follow up of Metastatic triple negative breast cancer to the bones. She was last seen by me on 02/01/23.  Today,  she states that she is doing well overall. Her appetite level is at ***%. Her energy level is at ***%.  PAST MEDICAL HISTORY:   Past Medical History: Past Medical History:  Diagnosis Date   Asthma    as child   Cancer (HCC) 12/2020   left breast IMC   Complication of anesthesia    pateitn states,' I coded when I had my D&Cmany years ago.   Dyspnea    Family history of breast cancer    Hypertension    Hypothyroidism    PONV (postoperative nausea and vomiting)    Port-A-Cath in place 02/26/2021    Surgical History: Past Surgical History:  Procedure Laterality Date   ABDOMINAL HYSTERECTOMY     BIOPSY  01/17/2018   Procedure: BIOPSY;  Surgeon: Malissa Hippo, MD;  [facility];  Service: Endoscopy;;  duodenum,gastric   CHOLECYSTECTOMY     COLONOSCOPY WITH PROPOFOL N/A 01/17/2018   Procedure: COLONOSCOPY WITH PROPOFOL;  Surgeon: Malissa Hippo, MD;  [facility];  Service: Endoscopy;  Laterality: N/A;  7:30   DILATION AND CURETTAGE OF UTERUS     ESOPHAGOGASTRODUODENOSCOPY (EGD) WITH PROPOFOL N/A 01/17/2018   Procedure: ESOPHAGOGASTRODUODENOSCOPY (EGD) WITH PROPOFOL;  Surgeon: Malissa Hippo, MD;  [facility];  Service: Endoscopy;  Laterality: N/A;   POLYPECTOMY  01/17/2018   Procedure: POLYPECTOMY;  Surgeon: Malissa Hippo, MD;  [facility];  Service: Endoscopy;;  transverse colon, cecal   PORTACATH PLACEMENT Right 02/17/2021   Procedure: INSERTION PORT-A-CATH;  Surgeon: Abigail Miyamoto, MD;  Location: [facility];  Service: General;  Laterality: Right;   RADIOACTIVE SEED GUIDED AXILLARY SENTINEL LYMPH NODE Left 11/20/2021   Procedure: RADIOACTIVE SEED GUIDED LEFT AXILLARY SENTINEL LYMPH NODE DISSECTION;  Surgeon: Abigail Miyamoto, MD;  Location: [facility];  Service: General;  Laterality: Left;   TOTAL MASTECTOMY Left 11/20/2021   Procedure: LEFT TOTAL MASTECTOMY;  Surgeon: Abigail Miyamoto, MD;  Location:  [facility];  Service: General;  Laterality: Left;    Social History: Social History   Socioeconomic History   Marital status: Widowed    Spouse name: Not on file   Number of children: 3   Years of education: Not on file   Highest education level: Not on file  Occupational History   Occupation: [employer]   Occupation: retired  Tobacco Use   Smoking status: Never   Smokeless tobacco: Never  Vaping Use   Vaping Use: Never used  Substance and Sexual Activity   Alcohol use: Never   Drug use: Never   Sexual activity: Not Currently    Birth control/protection: Surgical  Other Topics Concern   Not on file  Social History Narrative   Not on file   Social Determinants of Health   Financial Resource Strain: Low Risk  (07/10/2022)   Overall Financial Resource Strain (CARDIA)    Difficulty of Paying Living Expenses: Not hard at all  Food Insecurity:  No Food Insecurity (06/30/2022)   Hunger Vital Sign    Worried About Running Out of Food in the Last Year: Never true    Ran Out of Food in the Last Year: Never true  Transportation Needs: No Transportation Needs (07/10/2022)   PRAPARE - Administrator, Civil Service (Medical): No    Lack of Transportation (Non-Medical): No  Physical Activity: Insufficiently Active (01/22/2021)   Exercise Vital Sign    Days of Exercise per Week: 3 days    Minutes of Exercise per Session: 30 min  Stress: No Stress Concern Present (01/22/2021)   [employer] of Occupational Health - Occupational Stress Questionnaire    Feeling of Stress : Not at all  Social Connections: Moderately Integrated (01/22/2021)   Social Connection and Isolation Panel [NHANES]    Frequency of Communication with Friends and Family: More than three times a week    Frequency of Social Gatherings with Friends and Family: More than three times a week    Attends Religious Services: More than 4 times per year    Active Member of [employer]  or Organizations: No    Attends Engineer, structural: More than 4 times per year    Marital Status: Widowed  Intimate Partner Violence: Not At Risk (05/28/2022)   Humiliation, Afraid, Rape, and Kick questionnaire    Fear of Current or Ex-Partner: No    Emotionally Abused: No    Physically Abused: No    Sexually Abused: No    Family History: Family History  Problem Relation Age of Onset   Breast cancer Mother        dx in her 74s   Heart disease Mother    Thyroid disease Mother    Lung cancer Father        dx in his 60s   Thyroid disease Maternal Aunt    Thyroid nodules Maternal Grandmother 35       goiter   Thyroid disease Maternal Grandmother    Heart disease Maternal Grandfather    Heart disease Paternal Grandmother    Heart disease Paternal Grandfather    Thyroid disease Daughter    Thyroid disease Daughter    Cancer Maternal Uncle        NOS   Cancer Paternal Uncle        NOS   Breast cancer Cousin        pat first cousin died in her 81s;     Current Medications:  Current Outpatient Medications:    Acetaminophen (TYLENOL PO), Take 1 tablet by mouth as needed., Disp: , Rfl:    amoxicillin-clavulanate (AUGMENTIN) 875-125 MG tablet, Take 1 tablet by mouth 2 (two) times daily., Disp: 14 tablet, Rfl: 0   gabapentin (NEURONTIN) 300 MG capsule, Take 1 capsule by mouth 2 (two) times daily., Disp: , Rfl:    hydrALAZINE (APRESOLINE) 25 MG tablet, Take 25 mg by mouth 3 (three) times daily., Disp: , Rfl:    levothyroxine (SYNTHROID) 75 MCG tablet, Take 100 mcg by mouth daily before breakfast., Disp: , Rfl:    Melatonin 5 MG TABS, Take 10 mg by mouth. As needed for sleep, Disp: , Rfl:    olmesartan-hydrochlorothiazide (BENICAR HCT) 40-25 MG tablet, Take 1 tablet by mouth daily., Disp: , Rfl:    pantoprazole (PROTONIX) 40 MG tablet, Take 40 mg by mouth daily., Disp: , Rfl:    predniSONE (DELTASONE) 50 MG tablet, Take one tab po 13 hours prior, then one tab 7 hours  prior,  and then one tab 1 hour prior to MRI, Disp: 3 tablet, Rfl: 0   Allergies: Allergies  Allergen Reactions   Exforge [Amlodipine Besylate-Valsartan] Nausea And Vomiting   Gadolinium Derivatives Hives, Itching and Anaphylaxis    13hr prep prior to contrast admin  MRI iv contrast   Gadopiclenol Hives and Itching    13 hr prep prior to contrast admin    Tape Other (See Comments)    Redness and Irriatation   Cheese     Hard cheese   Levofloxacin In D5w Hives   Other    Proanthocyanidin    Strawberry Extract Diarrhea    Seeds,nuts, lettuce, grapes   Wild Lettuce Extract (Lactuca Virosa)    Yeast-Related Products Hives    Mold on bread    REVIEW OF SYSTEMS:   Review of Systems  Constitutional:  Negative for chills, fatigue and fever.  HENT:   Negative for lump/mass, mouth sores, nosebleeds, sore throat and trouble swallowing.   Eyes:  Negative for eye problems.  Respiratory:  Negative for cough and shortness of breath.   Cardiovascular:  Negative for chest pain, leg swelling and palpitations.  Gastrointestinal:  Negative for abdominal pain, constipation, diarrhea, nausea and vomiting.  Genitourinary:  Negative for bladder incontinence, difficulty urinating, dysuria, frequency, hematuria and nocturia.   Musculoskeletal:  Negative for arthralgias, back pain, flank pain, myalgias and neck pain.  Skin:  Negative for itching and rash.  Neurological:  Negative for dizziness, headaches and numbness.  Hematological:  Does not bruise/bleed easily.  Psychiatric/Behavioral:  Negative for depression, sleep disturbance and suicidal ideas. The patient is not nervous/anxious.   All other systems reviewed and are negative.    VITALS:   There were no vitals taken for this visit.  Wt Readings from Last 3 Encounters:  02/01/23 171 lb (77.6 kg)  01/11/23 167 lb 6.4 oz (75.9 kg)  12/21/22 168 lb 9.6 oz (76.5 kg)    There is no height or weight on file to calculate BMI.  Performance status  (ECOG): {CHL ONC [id number]  PHYSICAL EXAM:   Physical Exam Vitals and nursing note reviewed. Exam conducted with a chaperone present.  Constitutional:      Appearance: Normal appearance.  Cardiovascular:     Rate and Rhythm: Normal rate and regular rhythm.     Pulses: Normal pulses.     Heart sounds: Normal heart sounds.  Pulmonary:     Effort: Pulmonary effort is normal.     Breath sounds: Normal breath sounds.  Abdominal:     Palpations: Abdomen is soft. There is no hepatomegaly, splenomegaly or mass.     Tenderness: There is no abdominal tenderness.  Musculoskeletal:     Right lower leg: No edema.     Left lower leg: No edema.  Lymphadenopathy:     Cervical: No cervical adenopathy.     Right cervical: No superficial, deep or posterior cervical adenopathy.    Left cervical: No superficial, deep or posterior cervical adenopathy.     Upper Body:     Right upper body: No supraclavicular or axillary adenopathy.     Left upper body: No supraclavicular or axillary adenopathy.  Neurological:     General: No focal deficit present.     Mental Status: She is alert and oriented to person, place, and time.  Psychiatric:        Mood and Affect: Mood normal.        Behavior: Behavior normal.     LABS:  Latest Ref Rng & Units 02/01/2023   11:54 AM 01/11/2023   11:56 AM 12/21/2022    9:37 AM  CBC  WBC 4.0 - 10.5 K/uL 5.9  5.1  3.6   Hemoglobin 12.0 - 15.0 g/dL 16.1  09.6  04.5   Hematocrit 36.0 - 46.0 % 32.9  33.8  31.2   Platelets 150 - 400 K/uL 223  251  214       Latest Ref Rng & Units 02/01/2023   11:54 AM 01/29/2023   12:22 PM 01/11/2023   11:56 AM  CMP  Glucose 70 - 99 mg/dL 409   811   BUN 8 - 23 mg/dL 24   22   Creatinine 9.14 - 1.00 mg/dL 7.82  9.56  2.13   Sodium 135 - 145 mmol/L 137   136   Potassium 3.5 - 5.1 mmol/L 3.8   3.7   Chloride 98 - 111 mmol/L 106   101   CO2 22 - 32 mmol/L 24   25   Calcium 8.9 - 10.3 mg/dL 8.6   9.0   Total Protein 6.5 - 8.1  g/dL 6.4   7.0   Total Bilirubin 0.3 - 1.2 mg/dL 0.5   0.5   Alkaline Phos 38 - 126 U/L 134   128   AST 15 - 41 U/L 17   17   ALT 0 - 44 U/L 14   12      No results found for: "CEA1", "CEA" / No results found for: "CEA1", "CEA" No results found for: "PSA1" No results found for: "YQM578" No results found for: "CAN125"  No results found for: "TOTALPROTELP", "ALBUMINELP", "A1GS", "A2GS", "BETS", "BETA2SER", "GAMS", "MSPIKE", "SPEI" No results found for: "TIBC", "FERRITIN", "IRONPCTSAT" No results found for: "LDH"   STUDIES:   CT CHEST ABDOMEN PELVIS W CONTRAST  Result Date: 01/30/2023 CLINICAL DATA:  Breast cancer. Brain metastasis. On chemotherapy. * Tracking Code: BO * EXAM: CT CHEST, ABDOMEN, AND PELVIS WITH CONTRAST TECHNIQUE: Multidetector CT imaging of the chest, abdomen and pelvis was performed following the standard protocol during bolus administration of intravenous contrast. RADIATION DOSE REDUCTION: This exam was performed according to the departmental dose-optimization program which includes automated exposure control, adjustment of the mA and/or kV according to patient size and/or use of iterative reconstruction technique. CONTRAST:  OMNIPAQUE IOHEXOL 300 MG/ML  SOLN COMPARISON:  11/02/2022 FINDINGS: CT CHEST FINDINGS Cardiovascular: Right Port-A-Cath tip high right atrium. Aortic atherosclerosis. Tortuous thoracic aorta. Normal heart size, without pericardial effusion. No central pulmonary embolism, on this non-dedicated study. Mediastinum/Nodes: Small low left jugular/supraclavicular nodes are similar and nonspecific. Left axillary node dissection. No axillary adenopathy. No mediastinal or hilar adenopathy. Calcified left hilar nodes are likely due to old granulomatous disease. No internal mammary adenopathy. Lungs/Pleura: No pleural fluid. Left upper lobe calcified granuloma. Clustered left lower lobe pulmonary nodules including on 109/4 are similar and likely related to  mucoid impaction from remote infection/inflammation. Index nodule measures 6 mm today and is similar. Partially calcified left lower lobe nodule of 11 x 9 mm on 81/4 is similar to 12 x 9 mm on the prior, favored to represent a benign hamartoma or granuloma. Other scattered millimetric nodules are similar and identified on series 4. Example posteromedial right upper lobe at 5 mm on 59/4. Musculoskeletal: Left mastectomy. Heterogeneity and mild expansion of the posterior right sixth rib similar on 25/3. T11 sclerotic lesion is similar. T8 lytic lesion including at 1.5 cm is similar on 115 sagittal.  CT ABDOMEN PELVIS FINDINGS Hepatobiliary: Normal liver. Cholecystectomy, without biliary ductal dilatation. Pancreas: Normal, without mass or ductal dilatation. Spleen: Normal in size, without focal abnormality. Adrenals/Urinary Tract: Normal adrenal glands. Bilateral too small to characterize renal lesions there are most likely cysts . In the absence of clinically indicated signs/symptoms require(s) no independent follow-up. No hydronephrosis. Normal urinary bladder. Stomach/Bowel: Normal stomach, without wall thickening. Scattered colonic diverticula. Colonic stool burden suggests constipation. Normal terminal ileum. Normal small bowel. Vascular/Lymphatic: Aortic atherosclerosis. No abdominopelvic adenopathy. Reproductive: Hysterectomy.  No adnexal mass. Other: No significant free fluid. Mild pelvic floor laxity. No evidence of omental or peritoneal disease. Musculoskeletal: New or more well-defined lytic lesion within the left iliac at 1.6 cm on 83/3. Right iliac subcentimeter lytic lesion on 85/3 is unchanged. IMPRESSION: 1. Widespread osseous metastasis, primarily similar. A left iliac wing 1.6 cm lytic lesion is new or more well-defined today, suggesting mild disease progression. 2. No evidence of extraosseous metastasis. 3. Similar pulmonary nodules, favored to be benign. Clustered left lower lobe nodules are likely  postinfectious/inflammatory. A dominant left lower lobe nodule likely represents a benign hamartoma or granuloma. 4. Incidental findings, including: Possible constipation. Aortic Atherosclerosis (ICD10-I70.0). Electronically Signed   By: Jeronimo Greaves M.D.   On: 01/30/2023 17:10

## 2023-02-22 ENCOUNTER — Inpatient Hospital Stay (HOSPITAL_BASED_OUTPATIENT_CLINIC_OR_DEPARTMENT_OTHER): Payer: Medicare Other | Admitting: Hematology

## 2023-02-22 ENCOUNTER — Inpatient Hospital Stay: Payer: Medicare Other

## 2023-02-22 ENCOUNTER — Inpatient Hospital Stay: Payer: Medicare Other | Attending: Hematology

## 2023-02-22 VITALS — BP 127/78 | HR 58 | Temp 97.6°F | Resp 20

## 2023-02-22 DIAGNOSIS — C7931 Secondary malignant neoplasm of brain: Secondary | ICD-10-CM | POA: Insufficient documentation

## 2023-02-22 DIAGNOSIS — J45909 Unspecified asthma, uncomplicated: Secondary | ICD-10-CM | POA: Diagnosis not present

## 2023-02-22 DIAGNOSIS — C50912 Malignant neoplasm of unspecified site of left female breast: Secondary | ICD-10-CM

## 2023-02-22 DIAGNOSIS — E039 Hypothyroidism, unspecified: Secondary | ICD-10-CM | POA: Diagnosis not present

## 2023-02-22 DIAGNOSIS — C7951 Secondary malignant neoplasm of bone: Secondary | ICD-10-CM | POA: Insufficient documentation

## 2023-02-22 DIAGNOSIS — Z95828 Presence of other vascular implants and grafts: Secondary | ICD-10-CM

## 2023-02-22 DIAGNOSIS — C50412 Malignant neoplasm of upper-outer quadrant of left female breast: Secondary | ICD-10-CM | POA: Insufficient documentation

## 2023-02-22 DIAGNOSIS — Z79899 Other long term (current) drug therapy: Secondary | ICD-10-CM | POA: Insufficient documentation

## 2023-02-22 DIAGNOSIS — I700 Atherosclerosis of aorta: Secondary | ICD-10-CM | POA: Insufficient documentation

## 2023-02-22 DIAGNOSIS — Z9012 Acquired absence of left breast and nipple: Secondary | ICD-10-CM | POA: Diagnosis not present

## 2023-02-22 DIAGNOSIS — G629 Polyneuropathy, unspecified: Secondary | ICD-10-CM | POA: Diagnosis not present

## 2023-02-22 DIAGNOSIS — Z171 Estrogen receptor negative status [ER-]: Secondary | ICD-10-CM | POA: Diagnosis not present

## 2023-02-22 DIAGNOSIS — Z803 Family history of malignant neoplasm of breast: Secondary | ICD-10-CM | POA: Diagnosis not present

## 2023-02-22 DIAGNOSIS — K5730 Diverticulosis of large intestine without perforation or abscess without bleeding: Secondary | ICD-10-CM | POA: Diagnosis not present

## 2023-02-22 DIAGNOSIS — Z5112 Encounter for antineoplastic immunotherapy: Secondary | ICD-10-CM | POA: Diagnosis present

## 2023-02-22 DIAGNOSIS — R918 Other nonspecific abnormal finding of lung field: Secondary | ICD-10-CM | POA: Insufficient documentation

## 2023-02-22 LAB — CBC WITH DIFFERENTIAL/PLATELET
Abs Immature Granulocytes: 0.01 10*3/uL (ref 0.00–0.07)
Basophils Absolute: 0 10*3/uL (ref 0.0–0.1)
Basophils Relative: 0 %
Eosinophils Absolute: 0.3 10*3/uL (ref 0.0–0.5)
Eosinophils Relative: 8 %
HCT: 31.6 % — ABNORMAL LOW (ref 36.0–46.0)
Hemoglobin: 10.4 g/dL — ABNORMAL LOW (ref 12.0–15.0)
Immature Granulocytes: 0 %
Lymphocytes Relative: 30 %
Lymphs Abs: 1.1 10*3/uL (ref 0.7–4.0)
MCH: 35.6 pg — ABNORMAL HIGH (ref 26.0–34.0)
MCHC: 32.9 g/dL (ref 30.0–36.0)
MCV: 108.2 fL — ABNORMAL HIGH (ref 80.0–100.0)
Monocytes Absolute: 0.4 10*3/uL (ref 0.1–1.0)
Monocytes Relative: 11 %
Neutro Abs: 2 10*3/uL (ref 1.7–7.7)
Neutrophils Relative %: 51 %
Platelets: 197 10*3/uL (ref 150–400)
RBC: 2.92 MIL/uL — ABNORMAL LOW (ref 3.87–5.11)
RDW: 14.3 % (ref 11.5–15.5)
WBC: 3.8 10*3/uL — ABNORMAL LOW (ref 4.0–10.5)
nRBC: 0 % (ref 0.0–0.2)

## 2023-02-22 LAB — COMPREHENSIVE METABOLIC PANEL
ALT: 15 U/L (ref 0–44)
AST: 17 U/L (ref 15–41)
Albumin: 3.4 g/dL — ABNORMAL LOW (ref 3.5–5.0)
Alkaline Phosphatase: 140 U/L — ABNORMAL HIGH (ref 38–126)
Anion gap: 6 (ref 5–15)
BUN: 22 mg/dL (ref 8–23)
CO2: 23 mmol/L (ref 22–32)
Calcium: 8.6 mg/dL — ABNORMAL LOW (ref 8.9–10.3)
Chloride: 107 mmol/L (ref 98–111)
Creatinine, Ser: 0.78 mg/dL (ref 0.44–1.00)
GFR, Estimated: >60 mL/min (ref >60)
Glucose, Bld: 99 mg/dL (ref 70–99)
Potassium: 3.8 mmol/L (ref 3.5–5.1)
Sodium: 136 mmol/L (ref 135–145)
Total Bilirubin: 0.5 mg/dL (ref 0.3–1.2)
Total Protein: 6.7 g/dL (ref 6.5–8.1)

## 2023-02-22 LAB — MAGNESIUM: Magnesium: 1.8 mg/dL (ref 1.7–2.4)

## 2023-02-22 MED ORDER — SODIUM CHLORIDE 0.9% FLUSH
10.0000 mL | INTRAVENOUS | Status: DC | PRN
  Administered 2023-02-22: 10 mL via INTRAVENOUS

## 2023-02-22 MED ORDER — DEXTROSE 5 % IV SOLN: Freq: Once | INTRAVENOUS | Status: AC

## 2023-02-22 MED ORDER — HEPARIN SOD (PORK) LOCK FLUSH 100 UNIT/ML IV SOLN
500.0000 [IU] | Freq: Once | INTRAVENOUS | Status: AC | PRN
  Administered 2023-02-22: 500 [IU]

## 2023-02-22 MED ORDER — ACETAMINOPHEN 325 MG PO TABS
650.0000 mg | ORAL_TABLET | Freq: Once | ORAL | Status: AC
  Administered 2023-02-22: 650 mg via ORAL
  Filled 2023-02-22: qty 2

## 2023-02-22 MED ORDER — SODIUM CHLORIDE 0.9 % IV SOLN
150.0000 mg | Freq: Once | INTRAVENOUS | Status: AC
  Administered 2023-02-22: 150 mg via INTRAVENOUS
  Filled 2023-02-22: qty 150

## 2023-02-22 MED ORDER — CETIRIZINE HCL 10 MG/ML IV SOLN
10.0000 mg | Freq: Once | INTRAVENOUS | Status: AC
  Administered 2023-02-22: 10 mg via INTRAVENOUS
  Filled 2023-02-22: qty 1

## 2023-02-22 MED ORDER — SODIUM CHLORIDE 0.9 % IV SOLN
10.0000 mg | Freq: Once | INTRAVENOUS | Status: AC
  Administered 2023-02-22: 10 mg via INTRAVENOUS
  Filled 2023-02-22: qty 10

## 2023-02-22 MED ORDER — SODIUM CHLORIDE 0.9% FLUSH
10.0000 mL | INTRAVENOUS | Status: DC | PRN
  Administered 2023-02-22: 10 mL

## 2023-02-22 MED ORDER — FAM-TRASTUZUMAB DERUXTECAN-NXKI CHEMO 100 MG IV SOLR
340.0000 mg | Freq: Once | INTRAVENOUS | Status: AC
  Administered 2023-02-22: 340 mg via INTRAVENOUS
  Filled 2023-02-22: qty 17

## 2023-02-22 MED ORDER — PALONOSETRON HCL INJECTION 0.25 MG/5ML
0.2500 mg | Freq: Once | INTRAVENOUS | Status: AC
  Administered 2023-02-22: 0.25 mg via INTRAVENOUS
  Filled 2023-02-22: qty 5

## 2023-02-22 NOTE — Patient Instructions (Signed)
[facility] at [facility] Discharge Instructions   You were seen and examined today by Dr. Katragadda.  He reviewed the results of your lab work which are normal/stable.   We will proceed with your treatment today.  Return as scheduled.    Thank you for choosing [facility] at [facility] to provide your oncology and hematology care.  To afford each patient quality time with our provider, please arrive at least 15 minutes before your scheduled appointment time.   If you have a lab appointment with the Cancer Center please come in thru the Main Entrance and check in at the main information desk.  You need to re-schedule your appointment should you arrive 10 or more minutes late.  We strive to give you quality time with our providers, and arriving late affects you and other patients whose appointments are after yours.  Also, if you no show three or more times for appointments you may be dismissed from the clinic at the providers discretion.     Again, thank you for choosing [facility].  Our hope is that these requests will decrease the amount of time that you wait before being seen by our physicians.       _____________________________________________________________  Should you have questions after your visit to [facility], please contact our office at [phone] and follow the prompts.  Our office hours are 8:00 a.m. and 4:30 p.m. Monday - Friday.  Please note that voicemails left after 4:00 p.m. may not be returned until the following business day.  We are closed weekends and major holidays.  You do have access to a nurse 24-7, just call the main number to the clinic [phone] and do not press any options, hold on the line and a nurse will answer the phone.    For prescription refill requests, have your pharmacy contact our office and allow 72 hours.    Due to Covid, you will need to wear a mask upon entering  the hospital. If you do not have a mask, a mask will be given to you at the Main Entrance upon arrival. For doctor visits, patients may have 1 support person age 18 or older with them. For treatment visits, patients can not have anyone with them due to social distancing guidelines and our immunocompromised population.      

## 2023-02-22 NOTE — Progress Notes (Signed)
Adjust dose of Enhertu with new weight 76.9 kg  Enhertu 4.4 mg/kg = 340 mg  T.O. Dr Carilyn Goodpasture, PharmD

## 2023-02-22 NOTE — Progress Notes (Signed)
Patient has been examined by Dr. Katragadda. Vital signs and labs have been reviewed by MD - ANC, Creatinine, LFTs, hemoglobin, and platelets are within treatment parameters per M.D. - pt may proceed with treatment.  Primary RN and pharmacy notified.  

## 2023-02-22 NOTE — Patient Instructions (Signed)
[facility]  Discharge Instructions: Thank you for choosing [facility] to provide your oncology and hematology care.  If you have a lab appointment with the Cancer Center - please note that after April 8th, 2024, all labs will be drawn in the cancer center.  You do not have to check in or register with the main entrance as you have in the past but will complete your check-in in the cancer center.  Wear comfortable clothing and clothing appropriate for easy access to any Portacath or PICC line.   We strive to give you quality time with your provider. You may need to reschedule your appointment if you arrive late (15 or more minutes).  Arriving late affects you and other patients whose appointments are after yours.  Also, if you miss three or more appointments without notifying the office, you may be dismissed from the clinic at the provider's discretion.      For prescription refill requests, have your pharmacy contact our office and allow 72 hours for refills to be completed.    Today you received the following chemotherapy and/or immunotherapy agents En[facility].  Fam-Trastuzumab Deruxtecan Injection What is this medication? FAM-TRASTUZUMAB DERUXTECAN (fam-tras TOOZ eu mab DER ux TEE kan) treats some types of cancer. It works by blocking a protein that causes cancer cells to grow and multiply. This helps to slow or stop the spread of cancer cells. This medicine may be used for other purposes; ask your health care provider or pharmacist if you have questions. COMMON BRAND NAME(S): ENHERTU What should I tell my care team before I take this medication? They need to know if you have any of these conditions: Heart disease Heart failure Infection, especially a viral infection, such as chickenpox, cold sores, or herpes Liver disease Lung or breathing disease, such as asthma or COPD An unusual or allergic reaction to fam-trastuzumab deruxtecan, other medications,  foods, dyes, or preservatives Pregnant or trying to get pregnant Breast-feeding How should I use this medication? This medication is injected into a vein. It is given by your care team in a hospital or clinic setting. A special MedGuide will be given to you before each treatment. Be sure to read this information carefully each time. Talk to your care team about the use of this medication in children. Special care may be needed. Overdosage: If you think you have taken too much of this medicine contact a poison control center or emergency room at once. NOTE: This medicine is only for you. Do not share this medicine with others. What if I miss a dose? It is important not to miss your dose. Call your care team if you are unable to keep an appointment. What may interact with this medication? Interactions are not expected. This list may not describe all possible interactions. Give your health care provider a list of all the medicines, herbs, non-prescription drugs, or dietary supplements you use. Also tell them if you smoke, drink alcohol, or use illegal drugs. Some items may interact with your medicine. What should I watch for while using this medication? Visit your care team for regular checks on your progress. Tell your care team if your symptoms do not start to get better or if they get worse. Your condition will be monitored carefully while you are receiving this medication. Do not become pregnant while taking this medication or for 7 months after stopping it. Women should inform their care team if they wish to become pregnant or think   they might be pregnant. Men should not father a child while taking this medication and for 4 months after stopping it. There is potential for serious side effects to an unborn child. Talk to your care team for more information. Do not breast-feed an infant while taking this medication or for 7 months after the last dose. This medication has caused decreased sperm  counts in some men. This may make it more difficult to father a child. Talk to your care team if you are concerned about your fertility. This medication may increase your risk to bruise or bleed. Call your care team if you notice any unusual bleeding. Be careful brushing or flossing your teeth or using a toothpick because you may get an infection or bleed more easily. If you have any dental work done, tell your dentist you are receiving this medication. This medication may cause dry eyes and blurred vision. If you wear contact lenses, you may feel some discomfort. Lubricating eye drops may help. See your care team if the problem does not go away or is severe. This medication may increase your risk of getting an infection. Call your care team for advice if you get a fever, chills, sore throat, or other symptoms of a cold or flu. Do not treat yourself. Try to avoid being around people who are sick. Avoid taking medications that contain aspirin, acetaminophen, ibuprofen, naproxen, or ketoprofen unless instructed by your care team. These medications may hide a fever. What side effects may I notice from receiving this medication? Side effects that you should report to your care team as soon as possible: Allergic reactions--skin rash, itching, hives, swelling of the face, lips, tongue, or throat Dry cough, shortness of breath or trouble breathing Infection--fever, chills, cough, sore throat, wounds that don't heal, pain or trouble when passing urine, general feeling of discomfort or being unwell Heart failure--shortness of breath, swelling of the ankles, feet, or hands, sudden weight gain, unusual weakness or fatigue Unusual bruising or bleeding Side effects that usually do not require medical attention (report these to your care team if they continue or are bothersome): Constipation Diarrhea Hair loss Muscle pain Nausea Vomiting This list may not describe all possible side effects. Call your doctor  for medical advice about side effects. You may report side effects to FDA at 1-800-FDA-1088. Where should I keep my medication? This medication is given in a hospital or clinic. It will not be stored at home. NOTE: This sheet is a summary. It may not cover all possible information. If you have questions about this medicine, talk to your doctor, pharmacist, or health care provider.  2024 Elsevier/Gold Standard (2021-06-17 00:00:00)        To help prevent nausea and vomiting after your treatment, we encourage you to take your nausea medication as directed.  BELOW ARE SYMPTOMS THAT SHOULD BE REPORTED IMMEDIATELY: *FEVER GREATER THAN 100.4 F (38 C) OR HIGHER *CHILLS OR SWEATING *NAUSEA AND VOMITING THAT IS NOT CONTROLLED WITH YOUR NAUSEA MEDICATION *UNUSUAL SHORTNESS OF BREATH *UNUSUAL BRUISING OR BLEEDING *URINARY PROBLEMS (pain or burning when urinating, or frequent urination) *BOWEL PROBLEMS (unusual diarrhea, constipation, pain near the anus) TENDERNESS IN MOUTH AND THROAT WITH OR WITHOUT PRESENCE OF ULCERS (sore throat, sores in mouth, or a toothache) UNUSUAL RASH, SWELLING OR PAIN  UNUSUAL VAGINAL DISCHARGE OR ITCHING   Items with * indicate a potential emergency and should be followed up as soon as possible or go to the Emergency Department if any problems should occur.    Please show the CHEMOTHERAPY ALERT CARD or IMMUNOTHERAPY ALERT CARD at check-in to the Emergency Department and triage nurse.  Should you have questions after your visit or need to cancel or reschedule your appointment, please contact [facility] [phone]  and follow the prompts.  Office hours are 8:00 a.m. to 4:30 p.m. Monday - Friday. Please note that voicemails left after 4:00 p.m. may not be returned until the following business day.  We are closed weekends and major holidays. You have access to a nurse at all times for urgent questions. Please call the main number to the clinic  [phone] and follow the prompts.  For any non-urgent questions, you may also contact your provider using MyChart. We now offer e-Visits for anyone 18 and older to request care online for non-urgent symptoms. For details visit [email or web address].   Also download the MyChart app! Go to the app store, search "MyChart", open the app, select [facility], and log in with your MyChart username and password.   

## 2023-02-22 NOTE — Progress Notes (Signed)
Patients port flushed without difficulty.  Good blood return noted with no bruising or swelling noted at site.  VSS. Patient remains accessed for treatment.  

## 2023-02-22 NOTE — Progress Notes (Signed)
Patient presents today for chemotherapy infusion. Patient is in satisfactory condition with no new complaints voiced.  Vital signs are stable.  Labs reviewed by Dr. Katragadda during the office visit and all labs are within treatment parameters.  We will proceed with treatment per MD orders.   Patient tolerated treatment well with no complaints voiced.  Patient left ambulatory in stable condition.  Vital signs stable at discharge.  Follow up as scheduled.       

## 2023-02-23 LAB — CANCER ANTIGEN 27.29: CA 27.29: 39.5 U/mL — ABNORMAL HIGH (ref 0.0–38.6)

## 2023-02-24 ENCOUNTER — Inpatient Hospital Stay: Payer: Medicare Other

## 2023-02-24 VITALS — BP 120/71 | HR 61 | Temp 98.5°F | Resp 20

## 2023-02-24 DIAGNOSIS — C50412 Malignant neoplasm of upper-outer quadrant of left female breast: Secondary | ICD-10-CM | POA: Diagnosis not present

## 2023-02-24 DIAGNOSIS — C50912 Malignant neoplasm of unspecified site of left female breast: Secondary | ICD-10-CM

## 2023-02-24 DIAGNOSIS — C7951 Secondary malignant neoplasm of bone: Secondary | ICD-10-CM | POA: Diagnosis not present

## 2023-02-24 DIAGNOSIS — K5730 Diverticulosis of large intestine without perforation or abscess without bleeding: Secondary | ICD-10-CM | POA: Diagnosis not present

## 2023-02-24 DIAGNOSIS — Z171 Estrogen receptor negative status [ER-]: Secondary | ICD-10-CM | POA: Diagnosis not present

## 2023-02-24 DIAGNOSIS — I700 Atherosclerosis of aorta: Secondary | ICD-10-CM | POA: Diagnosis not present

## 2023-02-24 DIAGNOSIS — C7931 Secondary malignant neoplasm of brain: Secondary | ICD-10-CM | POA: Diagnosis not present

## 2023-02-24 DIAGNOSIS — Z95828 Presence of other vascular implants and grafts: Secondary | ICD-10-CM

## 2023-02-24 LAB — CANCER ANTIGEN 15-3: CA 15-3: 35.3 U/mL — ABNORMAL HIGH (ref 0.0–25.0)

## 2023-02-24 MED ORDER — PEGFILGRASTIM-FPGK 6 MG/0.6ML ~~LOC~~ SOSY
6.0000 mg | PREFILLED_SYRINGE | Freq: Once | SUBCUTANEOUS | Status: AC
  Administered 2023-02-24: 6 mg via SUBCUTANEOUS
  Filled 2023-02-24: qty 0.6

## 2023-02-24 NOTE — Progress Notes (Signed)
Patient tolerated injection with no complaints voiced. Site clean and dry with no bruising or swelling noted at site. See MAR for details. Band aid applied.  Patient stable during and after injection. VSS with discharge and left in satisfactory condition with no s/s of distress noted.  

## 2023-02-24 NOTE — Patient Instructions (Signed)
[facility]  Discharge Instructions: Thank you for choosing [facility] to provide your oncology and hematology care.  If you have a lab appointment with the Cancer Center - please note that after April 8th, 2024, all labs will be drawn in the cancer center.  You do not have to check in or register with the main entrance as you have in the past but will complete your check-in in the cancer center.  Wear comfortable clothing and clothing appropriate for easy access to any Portacath or PICC line.   We strive to give you quality time with your provider. You may need to reschedule your appointment if you arrive late (15 or more minutes).  Arriving late affects you and other patients whose appointments are after yours.  Also, if you miss three or more appointments without notifying the office, you may be dismissed from the clinic at the provider's discretion.      For prescription refill requests, have your pharmacy contact our office and allow 72 hours for refills to be completed.    Today you received the following [id number]-[id number] 6 return as scheduled.   To help prevent nausea and vomiting after your treatment, we encourage you to take your nausea medication as directed.  BELOW ARE SYMPTOMS THAT SHOULD BE REPORTED IMMEDIATELY: *FEVER GREATER THAN 100.4 F (38 C) OR HIGHER *CHILLS OR SWEATING *NAUSEA AND VOMITING THAT IS NOT CONTROLLED WITH YOUR NAUSEA MEDICATION *UNUSUAL SHORTNESS OF BREATH *UNUSUAL BRUISING OR BLEEDING *URINARY PROBLEMS (pain or burning when urinating, or frequent urination) *BOWEL PROBLEMS (unusual diarrhea, constipation, pain near the anus) TENDERNESS IN MOUTH AND THROAT WITH OR WITHOUT PRESENCE OF ULCERS (sore throat, sores in mouth, or a toothache) UNUSUAL RASH, SWELLING OR PAIN  UNUSUAL VAGINAL DISCHARGE OR ITCHING   Items with * indicate a potential emergency and should be followed up as soon as possible or go to the Emergency  Department if any problems should occur.  Please show the CHEMOTHERAPY ALERT CARD or IMMUNOTHERAPY ALERT CARD at check-in to the Emergency Department and triage nurse.  Should you have questions after your visit or need to cancel or reschedule your appointment, please contact [facility] [phone]  and follow the prompts.  Office hours are 8:00 a.m. to 4:30 p.m. Monday - Friday. Please note that voicemails left after 4:00 p.m. may not be returned until the following business day.  We are closed weekends and major holidays. You have access to a nurse at all times for urgent questions. Please call the main number to the clinic [phone] and follow the prompts.  For any non-urgent questions, you may also contact your provider using MyChart. We now offer e-Visits for anyone 32 and older to request care online for non-urgent symptoms. For details visit [email or web address].   Also download the MyChart app! Go to the app store, search "MyChart", open the app, select [facility], and log in with your MyChart username and password.

## 2023-03-14 NOTE — Progress Notes (Signed)
[facility] [address] [place] [address]    Clinic Day:  03/15/2023  Referring physician: Toma Deiters, MD  Patient Care Team: Toma Deiters, MD as PCP - General (Internal Medicine) [name], MD as Medical Oncologist (Medical Oncology)   ASSESSMENT & PLAN:   Assessment: 1.  T3N3c (stage IIIc) triple negative invasive lobular carcinoma of the left breast: - She felt lump in her left breast for more than 6 months, but thought it was secondary to fibrocystic disease which she had all her life.  When she started having pain, she reached out to Dr. Olena Leatherwood. - Ultrasound-guided left breast and left axillary lymph node biopsy on 01/15/2021 - Pathology consistent with invasive lobular carcinoma, E-cadherin negative.  ER/PR/HER2 negative.  HER2 2+ by IHC, negative by FISH.  Ki-67 is 5%.  Lymph node core biopsy was consistent with metastatic carcinoma.  Grade 2. - PET scan on 02/03/2021 showed involvement of left supraclavicular, subpectoral, axillary lymph nodes along with the breast mass.  10 mm left lung nodule which is hypometabolic.  Spinal cord lesion at T12 level with a strong uptake. - MRI of the lumbar spine with and without contrast on 02/20/2021 showed no mass or abnormal enhancement within the canal at the T12 level to correspond to the site of PET scan positive.  No marrow replacing bone lesion. - 12 weeks of carboplatin and paclitaxel weekly and every 3 weeks pembrolizumab followed by 4 cycles of dose dense AC with pembrolizumab ([id number]) from 02/27/2021 through 10/01/2021 - PET scan on 10/16/2021: Reduction in metabolic activity in the size of the left axillary lymph node and no evidence of breast hypermetabolism on PET scan. - Left mastectomy and lymph node excision by Dr. Magnus Ivan on 11/20/2021 - Pathology: 6.2 cm invasive lobular carcinoma, grade 2, margins negative.  19/21 lymph nodes involved with macrometastasis.  ypT3, YPN3A - Adjuvant  pembrolizumab started on 01/01/2022. -CREATE-X capecitabine 1500 mg twice daily 2 weeks on/1 week off started on 01/01/2022.  Last dose of pembrolizumab on 05/11/2022 - Germline mutation testing ([facility]) negative - [employer] (06/04/2022): T p53, K-ras V14 I, CDKN2A.  MSI-high not detected. - PET scan (05/28/2022): Multifocal bone lesions involving T11 vertebral body, right posterior sixth rib, left femoral neck, left scapular glenoid.  No other metastatic disease. - Sacituzumab cycle 1 on 08/18/2022.  She was hospitalized with neutropenic fever and severe diarrhea from 08/24/2022 through 08/27/2022. - CTAP (10/25/2021): Generalized mild to moderate wall thickening throughout the nondistended large bowel with faint pericolonic fat haziness compatible with nonspecific infectious/inflammatory colitis.  No free air.  C. difficile x 2 was negative. - She has UG T1 A1*28 heterozygosity. - Cycle 2 Sacituzumab dose reduced by 50%.  Discontinued secondary to poor tolerance. - Enhertu 3.2 Mg/KG cycle 1 on 11/04/2022   2.  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  She is non-smoker. - Mother died of breast cancer.  Maternal grandmother died very young in her 39s, sister has fibrocystic disease.  Father died of lung cancer and was a smoker.    Plan: 1.  Metastatic TNBC to the bones: - She has tolerated last cycle of Enhertu reasonably well. - She reported flulike symptoms after G-CSF injection, gradually getting worse.  She had symptoms lasting about 9 days after last injection. - After discussion about the risks including increased risk of infection, we have discontinued long-acting G-CSF with today's treatment. - Reviewed labs  today: Normal LFTs.  Alk phos elevated at 141.  CBC grossly normal.  CA 15-3 was 35.3 from 02/22/2023.  Level from today is pending. - Recommend proceeding with next cycle of Enhertu. - Recommend checking CBC 1 day prior to next  treatment in 3 weeks.  If ANC is low, she will receive short acting G-CSF.  RTC 3 weeks for follow-up.  2.  Brain lesions (SRS to 3 brain lesions on 06/16/2022): - MRI of the brain on 10/23/2022: Stable to interval decrease in size of previously treated lesions.  Repeat MRI scheduled on 04/19/2023.  3.  Peripheral neuropathy: - She stopped taking gabapentin.  She reports cramps in her feet at nighttime. - Recommend starting gabapentin 300 mg at bedtime.  4.  Hypomagnesemia: - Continue magnesium supplements.  Magnesium is 1.9.    No orders of the defined types were placed in this encounter.     I,Katie Daubenspeck,acting as a Neurosurgeon for [name], MD.,have documented all relevant documentation on the behalf of [name], MD,as directed by  [name], MD while in the presence of [name], MD.   I, [name] MD, have reviewed the above documentation for accuracy and completeness, and I agree with the above.   [name], MD   7/1/20245:37 PM  CHIEF COMPLAINT:   Diagnosis: Metastatic triple negative breast cancer to the bones    Cancer Staging  Invasive lobular carcinoma of left breast in female [facility]) Staging form: Breast, AJCC 8th Edition - Clinical stage from 01/23/2021: cT3, cN3c, G2, ER-, PR-, HER2- - Unsigned - Pathologic stage from 12/22/2021: No Stage Recommended (ypT3, pN3a, cM0, G2, ER-, PR-, HER2-) - Signed by [name], MD on 12/22/2021    Prior Therapy: 1.  Neoadjuvant chemotherapy with weekly carboplatin and paclitaxel and dose dense AC with pembrolizumab 2. Left mastectomy and lymph node excision by Dr. Magnus Ivan on 11/20/2021 3.  Sacituzumab on 08/18/2022 and 10/08/2021, discontinued due to poor tolerance.  Current Therapy:  Enhertu every 21 days    HISTORY OF PRESENT ILLNESS:   Oncology History  Invasive lobular carcinoma of left breast in female [facility])  01/23/2021 Initial Diagnosis   Invasive  lobular carcinoma of left breast in female [facility])   02/19/2021 Genetic Testing   Negative genetic testing on the CancerNext-Expanded+RNAinsight panel.  SMARCB1 VUS identified.  The CancerNext-Expanded gene panel offered by [facility] and includes sequencing and rearrangement analysis for the following 77 genes: AIP, ALK, APC*, ATM*, AXIN2, BAP1, BARD1, BLM, BMPR1A, BRCA1*, BRCA2*, BRIP1*, CDC73, CDH1*, CDK4, CDKN1B, CDKN2A, CHEK2*, CTNNA1, DICER1, FANCC, FH, FLCN, GALNT12, KIF1B, LZTR1, MAX, MEN1, MET, MLH1*, MSH2*, MSH3, MSH6*, MUTYH*, NBN, NF1*, NF2, NTHL1, PALB2*, PHOX2B, PMS2*, POT1, PRKAR1A, PTCH1, PTEN*, RAD51C*, RAD51D*, RB1, RECQL, RET, SDHA, SDHAF2, SDHB, SDHC, SDHD, SMAD4, SMARCA4, SMARCB1, SMARCE1, STK11, SUFU, TMEM127, TP53*, TSC1, TSC2, VHL and XRCC2 (sequencing and deletion/duplication); EGFR, EGLN1, HOXB13, KIT, MITF, PDGFRA, POLD1, and POLE (sequencing only); EPCAM and GREM1 (deletion/duplication only). DNA and RNA analyses performed for * genes. The report date is February 19, 2021.   02/27/2021 - 05/11/2022 Chemotherapy   Patient is on Treatment Plan : BREAST Pembrolizumab + Carboplatin D1,8,15+ Paclitaxel D1,8,15 q21d X 4 cycles / Pembrolizumab + AC q21d x 4 cycles     12/22/2021 Cancer Staging   Staging form: Breast, AJCC 8th Edition - Pathologic stage from 12/22/2021: No Stage Recommended (ypT3, pN3a, cM0, G2, ER-, PR-, HER2-) - Signed by [name], MD on 12/22/2021 Histopathologic type: Lobular carcinoma, NOS Stage prefix: Post-therapy  Method of lymph node assessment: Axillary lymph node dissection Multigene prognostic tests performed: None Histologic grading system: 3 grade system   08/18/2022 - 10/08/2022 Chemotherapy   Patient is on Treatment Plan : BREAST METASTATIC Sacituzumab govitecan-hziy Drinda Butts) D1,8 q21d     11/04/2022 -  Chemotherapy   Patient is on Treatment Plan : BREAST METASTATIC Fam-Trastuzumab Deruxtecan-nxki (Enhertu) (5.4) q21d        INTERVAL  HISTORY:   Denise [name] is a 72 y.o. female presenting to clinic today for follow up of Metastatic triple negative breast cancer to the bones. She was last seen by me on 02/22/23.  Today, she states that she is doing well overall. Her appetite level is at 75%. Her energy level is at 50%.  PAST MEDICAL HISTORY:   Past Medical History: Past Medical History:  Diagnosis Date   Asthma    as child   Cancer (HCC) 12/2020   left breast IMC   Complication of anesthesia    pateitn states,' I coded when I had my D&Cmany years ago.   Dyspnea    Family history of breast cancer    Hypertension    Hypothyroidism    PONV (postoperative nausea and vomiting)    Port-A-Cath in place 02/26/2021    Surgical History: Past Surgical History:  Procedure Laterality Date   ABDOMINAL HYSTERECTOMY     BIOPSY  01/17/2018   Procedure: BIOPSY;  Surgeon: Malissa Hippo, MD;  [facility];  Service: Endoscopy;;  duodenum,gastric   CHOLECYSTECTOMY     COLONOSCOPY WITH PROPOFOL N/A 01/17/2018   Procedure: COLONOSCOPY WITH PROPOFOL;  Surgeon: Malissa Hippo, MD;  [facility];  Service: Endoscopy;  Laterality: N/A;  7:30   DILATION AND CURETTAGE OF UTERUS     ESOPHAGOGASTRODUODENOSCOPY (EGD) WITH PROPOFOL N/A 01/17/2018   Procedure: ESOPHAGOGASTRODUODENOSCOPY (EGD) WITH PROPOFOL;  Surgeon: Malissa Hippo, MD;  [facility];  Service: Endoscopy;  Laterality: N/A;   POLYPECTOMY  01/17/2018   Procedure: POLYPECTOMY;  Surgeon: Malissa Hippo, MD;  [facility];  Service: Endoscopy;;  transverse colon, cecal   PORTACATH PLACEMENT Right 02/17/2021   Procedure: INSERTION PORT-A-CATH;  Surgeon: Abigail Miyamoto, MD;  Location: [facility];  Service: General;  Laterality: Right;   RADIOACTIVE SEED GUIDED AXILLARY SENTINEL LYMPH NODE Left 11/20/2021   Procedure: RADIOACTIVE SEED GUIDED LEFT AXILLARY SENTINEL LYMPH NODE DISSECTION;  Surgeon: Abigail Miyamoto, MD;   Location: [facility];  Service: General;  Laterality: Left;   TOTAL MASTECTOMY Left 11/20/2021   Procedure: LEFT TOTAL MASTECTOMY;  Surgeon: Abigail Miyamoto, MD;  Location:  SURGERY CENTER;  Service: General;  Laterality: Left;    Social History: Social History   Socioeconomic History   Marital status: Widowed    Spouse name: Not on file   Number of children: 3   Years of education: Not on file   Highest education level: Not on file  Occupational History   Occupation: [employer]   Occupation: retired  Tobacco Use   Smoking status: Never   Smokeless tobacco: Never  Vaping Use   Vaping Use: Never used  Substance and Sexual Activity   Alcohol use: Never   Drug use: Never   Sexual activity: Not Currently    Birth control/protection: Surgical  Other Topics Concern   Not on file  Social History Narrative   Not on file   Social Determinants of Health   Financial Resource Strain: Low Risk  (  07/10/2022)   Overall Financial Resource Strain (CARDIA)    Difficulty of Paying Living Expenses: Not hard at all  Food Insecurity: No Food Insecurity (06/30/2022)   Hunger Vital Sign    Worried About Running Out of Food in the Last Year: Never true    Ran Out of Food in the Last Year: Never true  Transportation Needs: No Transportation Needs (07/10/2022)   PRAPARE - Administrator, Civil Service (Medical): No    Lack of Transportation (Non-Medical): No  Physical Activity: Insufficiently Active (01/22/2021)   Exercise Vital Sign    Days of Exercise per Week: 3 days    Minutes of Exercise per Session: 30 min  Stress: No Stress Concern Present (01/22/2021)   [employer] of Occupational Health - Occupational Stress Questionnaire    Feeling of Stress : Not at all  Social Connections: Moderately Integrated (01/22/2021)   Social Connection and Isolation Panel [NHANES]    Frequency of Communication with Friends and Family: More than  three times a week    Frequency of Social Gatherings with Friends and Family: More than three times a week    Attends Religious Services: More than 4 times per year    Active Member of [employer] or Organizations: No    Attends Engineer, structural: More than 4 times per year    Marital Status: Widowed  Intimate Partner Violence: Not At Risk (05/28/2022)   Humiliation, Afraid, Rape, and Kick questionnaire    Fear of Current or Ex-Partner: No    Emotionally Abused: No    Physically Abused: No    Sexually Abused: No    Family History: Family History  Problem Relation Age of Onset   Breast cancer Mother        dx in her 17s   Heart disease Mother    Thyroid disease Mother    Lung cancer Father        dx in his 25s   Thyroid disease Maternal Aunt    Thyroid nodules Maternal Grandmother 35       goiter   Thyroid disease Maternal Grandmother    Heart disease Maternal Grandfather    Heart disease Paternal Grandmother    Heart disease Paternal Grandfather    Thyroid disease Daughter    Thyroid disease Daughter    Cancer Maternal Uncle        NOS   Cancer Paternal Uncle        NOS   Breast cancer Cousin        pat first cousin died in her 54s;     Current Medications:  Current Outpatient Medications:    Acetaminophen (TYLENOL PO), Take 1 tablet by mouth as needed., Disp: , Rfl:    amoxicillin-clavulanate (AUGMENTIN) 875-125 MG tablet, Take 1 tablet by mouth 2 (two) times daily., Disp: 14 tablet, Rfl: 0   gabapentin (NEURONTIN) 300 MG capsule, Take 1 capsule by mouth 2 (two) times daily., Disp: , Rfl:    hydrALAZINE (APRESOLINE) 25 MG tablet, Take 25 mg by mouth 3 (three) times daily., Disp: , Rfl:    levothyroxine (SYNTHROID) 75 MCG tablet, Take 100 mcg by mouth daily before breakfast., Disp: , Rfl:    Melatonin 5 MG TABS, Take 10 mg by mouth. As needed for sleep, Disp: , Rfl:    midodrine (PROAMATINE) 10 MG tablet, Take 10 mg by mouth daily., Disp: , Rfl:     olmesartan-hydrochlorothiazide (BENICAR HCT) 40-25 MG tablet, Take 1 tablet by mouth  daily., Disp: , Rfl:    pantoprazole (PROTONIX) 40 MG tablet, Take 40 mg by mouth daily., Disp: , Rfl:    predniSONE (DELTASONE) 50 MG tablet, Take one tab po 13 hours prior, then one tab 7 hours prior, and then one tab 1 hour prior to MRI, Disp: 3 tablet, Rfl: 0 No current facility-administered medications for this visit.  Facility-Administered Medications Ordered in Other Visits:    sodium chloride flush (NS) 0.9 % injection 10 mL, 10 mL, Intracatheter, PRN, [name], MD, 10 mL at 03/15/23 1556   Allergies: Allergies  Allergen Reactions   Exforge [Amlodipine Besylate-Valsartan] Nausea And Vomiting   Gadolinium Derivatives Hives, Itching and Anaphylaxis    13hr prep prior to contrast admin  MRI iv contrast   Gadopiclenol Hives and Itching    13 hr prep prior to contrast admin    Tape Other (See Comments)    Redness and Irriatation   Cheese     Hard cheese   Levofloxacin In D5w Hives   Other    Proanthocyanidin    Strawberry Extract Diarrhea    Seeds,nuts, lettuce, grapes   Wild Lettuce Extract (Lactuca Virosa)    Yeast-Related Products Hives    Mold on bread    REVIEW OF SYSTEMS:   Review of Systems  Constitutional:  Negative for chills, fatigue and fever.  HENT:   Negative for lump/mass, mouth sores, nosebleeds, sore throat and trouble swallowing.   Eyes:  Negative for eye problems.  Respiratory:  Positive for shortness of breath. Negative for cough.   Cardiovascular:  Negative for chest pain, leg swelling and palpitations.  Gastrointestinal:  Positive for diarrhea and nausea. Negative for abdominal pain, constipation and vomiting.  Genitourinary:  Negative for bladder incontinence, difficulty urinating, dysuria, frequency, hematuria and nocturia.   Musculoskeletal:  Negative for arthralgias, back pain, flank pain, myalgias and neck pain.  Skin:  Negative for itching and rash.   Neurological:  Positive for dizziness and headaches. Negative for numbness.  Hematological:  Does not bruise/bleed easily.  Psychiatric/Behavioral:  Positive for sleep disturbance. Negative for depression and suicidal ideas. The patient is not nervous/anxious.   All other systems reviewed and are negative.    VITALS:   Blood pressure 110/72, pulse 68, temperature 98.4 F (36.9 C), temperature source Oral, resp. rate 18, weight 166 lb 4.8 oz (75.4 kg), SpO2 99 %.  Wt Readings from Last 3 Encounters:  03/15/23 166 lb 4.8 oz (75.4 kg)  02/22/23 169 lb 9.6 oz (76.9 kg)  02/01/23 171 lb (77.6 kg)    Body mass index is 27.67 kg/m.  Performance status (ECOG): 1 - Symptomatic but completely ambulatory  PHYSICAL EXAM:   Physical Exam Vitals and nursing note reviewed. Exam conducted with a chaperone present.  Constitutional:      Appearance: Normal appearance.  Cardiovascular:     Rate and Rhythm: Normal rate and regular rhythm.     Pulses: Normal pulses.     Heart sounds: Normal heart sounds.  Pulmonary:     Effort: Pulmonary effort is normal.     Breath sounds: Normal breath sounds.  Abdominal:     Palpations: Abdomen is soft. There is no hepatomegaly, splenomegaly or mass.     Tenderness: There is no abdominal tenderness.  Musculoskeletal:     Right lower leg: No edema.     Left lower leg: No edema.  Lymphadenopathy:     Cervical: No cervical adenopathy.     Right cervical: No superficial, deep  or posterior cervical adenopathy.    Left cervical: No superficial, deep or posterior cervical adenopathy.     Upper Body:     Right upper body: No supraclavicular or axillary adenopathy.     Left upper body: No supraclavicular or axillary adenopathy.  Neurological:     General: No focal deficit present.     Mental Status: She is alert and oriented to person, place, and time.  Psychiatric:        Mood and Affect: Mood normal.        Behavior: Behavior normal.     LABS:       Latest Ref Rng & Units 03/15/2023   12:15 PM 02/22/2023   10:19 AM 02/01/2023   11:54 AM  CBC  WBC 4.0 - 10.5 K/uL 4.8  3.8  5.9   Hemoglobin 12.0 - 15.0 g/dL 16.1  09.6  04.5   Hematocrit 36.0 - 46.0 % 33.2  31.6  32.9   Platelets 150 - 400 K/uL 272  197  223       Latest Ref Rng & Units 03/15/2023   12:15 PM 02/22/2023   10:19 AM 02/01/2023   11:54 AM  CMP  Glucose 70 - 99 mg/dL 409  99  811   BUN 8 - 23 mg/dL 25  22  24    Creatinine 0.44 - 1.00 mg/dL 9.14  7.82  9.56   Sodium 135 - 145 mmol/L 135  136  137   Potassium 3.5 - 5.1 mmol/L 4.1  3.8  3.8   Chloride 98 - 111 mmol/L 106  107  106   CO2 22 - 32 mmol/L 24  23  24    Calcium 8.9 - 10.3 mg/dL 8.5  8.6  8.6   Total Protein 6.5 - 8.1 g/dL 6.9  6.7  6.4   Total Bilirubin 0.3 - 1.2 mg/dL 0.3  0.5  0.5   Alkaline Phos 38 - 126 U/L 141  140  134   AST 15 - 41 U/L 17  17  17    ALT 0 - 44 U/L 16  15  14       No results found for: "CEA1", "CEA" / No results found for: "CEA1", "CEA" No results found for: "PSA1" No results found for: "OZH086" No results found for: "CAN125"  No results found for: "TOTALPROTELP", "ALBUMINELP", "A1GS", "A2GS", "BETS", "BETA2SER", "GAMS", "MSPIKE", "SPEI" No results found for: "TIBC", "FERRITIN", "IRONPCTSAT" No results found for: "LDH"   STUDIES:   No results found.

## 2023-03-15 ENCOUNTER — Encounter: Payer: Self-pay | Admitting: Hematology

## 2023-03-15 ENCOUNTER — Inpatient Hospital Stay: Payer: Medicare Other

## 2023-03-15 ENCOUNTER — Inpatient Hospital Stay (HOSPITAL_BASED_OUTPATIENT_CLINIC_OR_DEPARTMENT_OTHER): Payer: Medicare Other | Admitting: Hematology

## 2023-03-15 ENCOUNTER — Inpatient Hospital Stay: Payer: Medicare Other | Attending: Hematology

## 2023-03-15 VITALS — BP 117/74 | HR 62 | Temp 98.5°F | Resp 18

## 2023-03-15 DIAGNOSIS — R748 Abnormal levels of other serum enzymes: Secondary | ICD-10-CM | POA: Diagnosis not present

## 2023-03-15 DIAGNOSIS — Z5112 Encounter for antineoplastic immunotherapy: Secondary | ICD-10-CM | POA: Insufficient documentation

## 2023-03-15 DIAGNOSIS — Z95828 Presence of other vascular implants and grafts: Secondary | ICD-10-CM

## 2023-03-15 DIAGNOSIS — Z5189 Encounter for other specified aftercare: Secondary | ICD-10-CM | POA: Diagnosis not present

## 2023-03-15 DIAGNOSIS — C50912 Malignant neoplasm of unspecified site of left female breast: Secondary | ICD-10-CM

## 2023-03-15 DIAGNOSIS — Z801 Family history of malignant neoplasm of trachea, bronchus and lung: Secondary | ICD-10-CM | POA: Diagnosis not present

## 2023-03-15 DIAGNOSIS — Z9012 Acquired absence of left breast and nipple: Secondary | ICD-10-CM | POA: Insufficient documentation

## 2023-03-15 DIAGNOSIS — C778 Secondary and unspecified malignant neoplasm of lymph nodes of multiple regions: Secondary | ICD-10-CM | POA: Diagnosis not present

## 2023-03-15 DIAGNOSIS — Z79899 Other long term (current) drug therapy: Secondary | ICD-10-CM | POA: Insufficient documentation

## 2023-03-15 DIAGNOSIS — C7951 Secondary malignant neoplasm of bone: Secondary | ICD-10-CM | POA: Insufficient documentation

## 2023-03-15 DIAGNOSIS — G629 Polyneuropathy, unspecified: Secondary | ICD-10-CM | POA: Diagnosis not present

## 2023-03-15 DIAGNOSIS — Z803 Family history of malignant neoplasm of breast: Secondary | ICD-10-CM | POA: Insufficient documentation

## 2023-03-15 LAB — COMPREHENSIVE METABOLIC PANEL
ALT: 16 U/L (ref 0–44)
AST: 17 U/L (ref 15–41)
Albumin: 3.6 g/dL (ref 3.5–5.0)
Alkaline Phosphatase: 141 U/L — ABNORMAL HIGH (ref 38–126)
Anion gap: 5 (ref 5–15)
BUN: 25 mg/dL — ABNORMAL HIGH (ref 8–23)
CO2: 24 mmol/L (ref 22–32)
Calcium: 8.5 mg/dL — ABNORMAL LOW (ref 8.9–10.3)
Chloride: 106 mmol/L (ref 98–111)
Creatinine, Ser: 0.83 mg/dL (ref 0.44–1.00)
GFR, Estimated: >60 mL/min (ref >60)
Glucose, Bld: 111 mg/dL — ABNORMAL HIGH (ref 70–99)
Potassium: 4.1 mmol/L (ref 3.5–5.1)
Sodium: 135 mmol/L (ref 135–145)
Total Bilirubin: 0.3 mg/dL (ref 0.3–1.2)
Total Protein: 6.9 g/dL (ref 6.5–8.1)

## 2023-03-15 LAB — CBC WITH DIFFERENTIAL/PLATELET
Abs Immature Granulocytes: 0.02 10*3/uL (ref 0.00–0.07)
Basophils Absolute: 0 10*3/uL (ref 0.0–0.1)
Basophils Relative: 0 %
Eosinophils Absolute: 0.3 10*3/uL (ref 0.0–0.5)
Eosinophils Relative: 6 %
HCT: 33.2 % — ABNORMAL LOW (ref 36.0–46.0)
Hemoglobin: 10.9 g/dL — ABNORMAL LOW (ref 12.0–15.0)
Immature Granulocytes: 0 %
Lymphocytes Relative: 27 %
Lymphs Abs: 1.3 10*3/uL (ref 0.7–4.0)
MCH: 35.5 pg — ABNORMAL HIGH (ref 26.0–34.0)
MCHC: 32.8 g/dL (ref 30.0–36.0)
MCV: 108.1 fL — ABNORMAL HIGH (ref 80.0–100.0)
Monocytes Absolute: 0.6 10*3/uL (ref 0.1–1.0)
Monocytes Relative: 12 %
Neutro Abs: 2.6 10*3/uL (ref 1.7–7.7)
Neutrophils Relative %: 55 %
Platelets: 272 10*3/uL (ref 150–400)
RBC: 3.07 MIL/uL — ABNORMAL LOW (ref 3.87–5.11)
RDW: 13.5 % (ref 11.5–15.5)
WBC: 4.8 10*3/uL (ref 4.0–10.5)
nRBC: 0 % (ref 0.0–0.2)

## 2023-03-15 LAB — MAGNESIUM: Magnesium: 1.9 mg/dL (ref 1.7–2.4)

## 2023-03-15 MED ORDER — CETIRIZINE HCL 10 MG/ML IV SOLN
10.0000 mg | Freq: Once | INTRAVENOUS | Status: AC
  Administered 2023-03-15: 10 mg via INTRAVENOUS
  Filled 2023-03-15: qty 1

## 2023-03-15 MED ORDER — FAM-TRASTUZUMAB DERUXTECAN-NXKI CHEMO 100 MG IV SOLR
4.4000 mg/kg | Freq: Once | INTRAVENOUS | Status: AC
  Administered 2023-03-15: 340 mg via INTRAVENOUS
  Filled 2023-03-15: qty 17

## 2023-03-15 MED ORDER — SODIUM CHLORIDE 0.9 % IV SOLN
10.0000 mg | Freq: Once | INTRAVENOUS | Status: AC
  Administered 2023-03-15: 10 mg via INTRAVENOUS
  Filled 2023-03-15: qty 10

## 2023-03-15 MED ORDER — SODIUM CHLORIDE 0.9% FLUSH
10.0000 mL | INTRAVENOUS | Status: DC | PRN
  Administered 2023-03-15: 10 mL

## 2023-03-15 MED ORDER — PALONOSETRON HCL INJECTION 0.25 MG/5ML
0.2500 mg | Freq: Once | INTRAVENOUS | Status: AC
  Administered 2023-03-15: 0.25 mg via INTRAVENOUS
  Filled 2023-03-15: qty 5

## 2023-03-15 MED ORDER — HEPARIN SOD (PORK) LOCK FLUSH 100 UNIT/ML IV SOLN
500.0000 [IU] | Freq: Once | INTRAVENOUS | Status: AC | PRN
  Administered 2023-03-15: 500 [IU]

## 2023-03-15 MED ORDER — DEXTROSE 5 % IV SOLN: Freq: Once | INTRAVENOUS | Status: AC

## 2023-03-15 MED ORDER — ACETAMINOPHEN 325 MG PO TABS
650.0000 mg | ORAL_TABLET | Freq: Once | ORAL | Status: AC
  Administered 2023-03-15: 650 mg via ORAL
  Filled 2023-03-15: qty 2

## 2023-03-15 MED ORDER — SODIUM CHLORIDE 0.9 % IV SOLN
150.0000 mg | Freq: Once | INTRAVENOUS | Status: AC
  Administered 2023-03-15: 150 mg via INTRAVENOUS
  Filled 2023-03-15: qty 150

## 2023-03-15 NOTE — Progress Notes (Signed)
Maintain dose at 340 mg  T.O. Dr Carilyn Goodpasture, PharmD

## 2023-03-15 NOTE — Progress Notes (Signed)
Labs reviewed at office visit today. Ok to proceed as planned. No injection needed for Wednesday per MD.   Treatment given per orders. Patient tolerated it well without problems. Vitals stable and discharged home from clinic ambulatory. Follow up as scheduled.

## 2023-03-15 NOTE — Patient Instructions (Addendum)
[facility]  Discharge Instructions: Thank you for choosing [facility] to provide your oncology and hematology care.  If you have a lab appointment with the Cancer Center - please note that after April 8th, 2024, all labs will be drawn in the cancer center.  You do not have to check in or register with the main entrance as you have in the past but will complete your check-in in the cancer center.  Wear comfortable clothing and clothing appropriate for easy access to any Portacath or PICC line.   We strive to give you quality time with your provider. You may need to reschedule your appointment if you arrive late (15 or more minutes).  Arriving late affects you and other patients whose appointments are after yours.  Also, if you miss three or more appointments without notifying the office, you may be dismissed from the clinic at the provider's discretion.      For prescription refill requests, have your pharmacy contact our office and allow 72 hours for refills to be completed.    Today you received the following chemotherapy and/or immunotherapy agents Enhertu      To help prevent nausea and vomiting after your treatment, we encourage you to take your nausea medication as directed.  BELOW ARE SYMPTOMS THAT SHOULD BE REPORTED IMMEDIATELY: *FEVER GREATER THAN 100.4 F (38 C) OR HIGHER *CHILLS OR SWEATING *NAUSEA AND VOMITING THAT IS NOT CONTROLLED WITH YOUR NAUSEA MEDICATION *UNUSUAL SHORTNESS OF BREATH *UNUSUAL BRUISING OR BLEEDING *URINARY PROBLEMS (pain or burning when urinating, or frequent urination) *BOWEL PROBLEMS (unusual diarrhea, constipation, pain near the anus) TENDERNESS IN MOUTH AND THROAT WITH OR WITHOUT PRESENCE OF ULCERS (sore throat, sores in mouth, or a toothache) UNUSUAL RASH, SWELLING OR PAIN  UNUSUAL VAGINAL DISCHARGE OR ITCHING   Items with * indicate a potential emergency and should be followed up as soon as possible or go to the  Emergency Department if any problems should occur.  Please show the CHEMOTHERAPY ALERT CARD or IMMUNOTHERAPY ALERT CARD at check-in to the Emergency Department and triage nurse.  Should you have questions after your visit or need to cancel or reschedule your appointment, please contact [facility] [phone]  and follow the prompts.  Office hours are 8:00 a.m. to 4:30 p.m. Monday - Friday. Please note that voicemails left after 4:00 p.m. may not be returned until the following business day.  We are closed weekends and major holidays. You have access to a nurse at all times for urgent questions. Please call the main number to the clinic [phone] and follow the prompts.  For any non-urgent questions, you may also contact your provider using MyChart. We now offer e-Visits for anyone 18 and older to request care online for non-urgent symptoms. For details visit [email or web address].   Also download the MyChart app! Go to the app store, search "MyChart", open the app, select [facility], and log in with your MyChart username and password.   

## 2023-03-15 NOTE — Progress Notes (Signed)
Patient has been assessed, vital signs and labs have been reviewed by Dr. Katragadda. ANC, Creatinine, LFTs, and Platelets are within treatment parameters per Dr. Katragadda. The patient is good to proceed with treatment at this time. Primary RN and pharmacy aware.  

## 2023-03-15 NOTE — Patient Instructions (Addendum)
[facility]  Discharge Instructions  You were seen and examined today by Dr. Ellin Saba.  Proceed with treatment today.  Take Gabapentin 300mg  each night before bedtime.  Follow-up as scheduled.  Thank you for choosing [facility] to provide your oncology and hematology care.   To afford each patient quality time with our provider, please arrive at least 15 minutes before your scheduled appointment time. You may need to reschedule your appointment if you arrive late (10 or more minutes). Arriving late affects you and other patients whose appointments are after yours.  Also, if you miss three or more appointments without notifying the office, you may be dismissed from the clinic at the provider's discretion.    Again, thank you for choosing [facility]  Our hope is that these requests will decrease the amount of time that you wait before being seen by our physicians.   If you have a lab appointment with the Cancer Center - please note that after April 8th, all labs will be drawn in the cancer center.  You do not have to check in or register with the main entrance as you have in the past but will complete your check-in at the cancer center.            _____________________________________________________________  Should you have questions after your visit to [facility], please contact our office at [phone] and follow the prompts.  Our office hours are 8:00 a.m. to 4:30 p.m. Monday - Thursday and 8:00 a.m. to 2:30 p.m. Friday.  Please note that voicemails left after 4:00 p.m. may not be returned until the following business day.  We are closed weekends and all major holidays.  You do have access to a nurse 24-7, just call the main number to the clinic [phone] and do not press any options, hold on the line and a nurse will answer the phone.    For prescription refill requests, have your pharmacy contact  our office and allow 72 hours.    Masks are no longer required in the cancer centers. If you would like for your care team to wear a mask while they are taking care of you, please let them know. You may have one support person who is at least 74 years old accompany you for your appointments.

## 2023-03-16 LAB — CANCER ANTIGEN 15-3: CA 15-3: 35.8 U/mL — ABNORMAL HIGH (ref 0.0–25.0)

## 2023-03-16 LAB — CANCER ANTIGEN 27.29: CA 27.29: 36.7 U/mL (ref 0.0–38.6)

## 2023-03-17 ENCOUNTER — Other Ambulatory Visit: Payer: Self-pay

## 2023-03-17 ENCOUNTER — Inpatient Hospital Stay: Payer: Medicare Other

## 2023-03-17 ENCOUNTER — Telehealth: Payer: Self-pay | Admitting: *Deleted

## 2023-03-17 NOTE — Telephone Encounter (Signed)
Patient called the after hours nurse line to report chest pain, associated with tightness.  Does have a cough, which she has bee treated for in the past.  Had treatment on Monday.  She was advised to go to the ER, which she declined.  After speaking with her, encouraged her to be seen and evaluated in the ER.  She stated that she would do so.

## 2023-03-20 DIAGNOSIS — I10 Essential (primary) hypertension: Secondary | ICD-10-CM | POA: Diagnosis not present

## 2023-03-20 DIAGNOSIS — Z882 Allergy status to sulfonamides status: Secondary | ICD-10-CM | POA: Diagnosis not present

## 2023-03-20 DIAGNOSIS — K529 Noninfective gastroenteritis and colitis, unspecified: Secondary | ICD-10-CM | POA: Diagnosis not present

## 2023-03-20 DIAGNOSIS — Z881 Allergy status to other antibiotic agents status: Secondary | ICD-10-CM | POA: Diagnosis not present

## 2023-03-20 DIAGNOSIS — R059 Cough, unspecified: Secondary | ICD-10-CM | POA: Diagnosis not present

## 2023-03-20 DIAGNOSIS — U071 COVID-19: Secondary | ICD-10-CM | POA: Diagnosis not present

## 2023-03-22 ENCOUNTER — Telehealth: Payer: Self-pay

## 2023-03-22 NOTE — Telephone Encounter (Signed)
Patient called to report she tested positive for Covid on Thursday, patient reports fevers, nausea, diarrhea, and unable to keep much down. Patient advised to go to the Emergency room, patient states she is unsure if she wants to go to the emergency room states she will try to manage symptoms at home and if she feels like going to ED she will go.

## 2023-03-24 ENCOUNTER — Other Ambulatory Visit: Payer: Self-pay

## 2023-03-24 ENCOUNTER — Emergency Department (HOSPITAL_COMMUNITY): Payer: Medicare Other

## 2023-03-24 ENCOUNTER — Encounter (HOSPITAL_COMMUNITY): Payer: Self-pay | Admitting: Emergency Medicine

## 2023-03-24 ENCOUNTER — Emergency Department (HOSPITAL_COMMUNITY)
Admission: EM | Admit: 2023-03-24 | Discharge: 2023-03-24 | Disposition: A | Discharge status: Home / Self Care | Payer: Medicare Other | Attending: Student | Admitting: Student

## 2023-03-24 DIAGNOSIS — R531 Weakness: Secondary | ICD-10-CM | POA: Diagnosis not present

## 2023-03-24 DIAGNOSIS — R0602 Shortness of breath: Secondary | ICD-10-CM | POA: Diagnosis not present

## 2023-03-24 DIAGNOSIS — Z79899 Other long term (current) drug therapy: Secondary | ICD-10-CM | POA: Diagnosis not present

## 2023-03-24 DIAGNOSIS — I10 Essential (primary) hypertension: Secondary | ICD-10-CM | POA: Diagnosis not present

## 2023-03-24 DIAGNOSIS — Z853 Personal history of malignant neoplasm of breast: Secondary | ICD-10-CM | POA: Insufficient documentation

## 2023-03-24 DIAGNOSIS — U071 COVID-19: Secondary | ICD-10-CM | POA: Diagnosis not present

## 2023-03-24 DIAGNOSIS — D72819 Decreased white blood cell count, unspecified: Secondary | ICD-10-CM | POA: Diagnosis not present

## 2023-03-24 DIAGNOSIS — R0789 Other chest pain: Secondary | ICD-10-CM | POA: Diagnosis not present

## 2023-03-24 DIAGNOSIS — R079 Chest pain, unspecified: Secondary | ICD-10-CM | POA: Diagnosis not present

## 2023-03-24 DIAGNOSIS — E039 Hypothyroidism, unspecified: Secondary | ICD-10-CM | POA: Diagnosis not present

## 2023-03-24 DIAGNOSIS — Z8616 Personal history of COVID-19: Secondary | ICD-10-CM | POA: Diagnosis not present

## 2023-03-24 DIAGNOSIS — I517 Cardiomegaly: Secondary | ICD-10-CM | POA: Diagnosis not present

## 2023-03-24 DIAGNOSIS — R112 Nausea with vomiting, unspecified: Secondary | ICD-10-CM | POA: Diagnosis present

## 2023-03-24 DIAGNOSIS — K2970 Gastritis, unspecified, without bleeding: Secondary | ICD-10-CM

## 2023-03-24 DIAGNOSIS — J45909 Unspecified asthma, uncomplicated: Secondary | ICD-10-CM | POA: Diagnosis not present

## 2023-03-24 LAB — BASIC METABOLIC PANEL
Anion gap: 11 (ref 5–15)
BUN: 21 mg/dL (ref 8–23)
CO2: 19 mmol/L — ABNORMAL LOW (ref 22–32)
Calcium: 8.4 mg/dL — ABNORMAL LOW (ref 8.9–10.3)
Chloride: 104 mmol/L (ref 98–111)
Creatinine, Ser: 0.83 mg/dL (ref 0.44–1.00)
GFR, Estimated: >60 mL/min (ref >60)
Glucose, Bld: 85 mg/dL (ref 70–99)
Potassium: 3.7 mmol/L (ref 3.5–5.1)
Sodium: 134 mmol/L — ABNORMAL LOW (ref 135–145)

## 2023-03-24 LAB — BRAIN NATRIURETIC PEPTIDE: B Natriuretic Peptide: 11 pg/mL (ref 0.0–100.0)

## 2023-03-24 LAB — CBC
HCT: 33.1 % — ABNORMAL LOW (ref 36.0–46.0)
Hemoglobin: 11.3 g/dL — ABNORMAL LOW (ref 12.0–15.0)
MCH: 35.6 pg — ABNORMAL HIGH (ref 26.0–34.0)
MCHC: 34.1 g/dL (ref 30.0–36.0)
MCV: 104.4 fL — ABNORMAL HIGH (ref 80.0–100.0)
Platelets: 116 10*3/uL — ABNORMAL LOW (ref 150–400)
RBC: 3.17 MIL/uL — ABNORMAL LOW (ref 3.87–5.11)
RDW: 13 % (ref 11.5–15.5)
WBC: 2.2 10*3/uL — ABNORMAL LOW (ref 4.0–10.5)
nRBC: 0 % (ref 0.0–0.2)

## 2023-03-24 LAB — D-DIMER, QUANTITATIVE: D-Dimer, Quant: 0.39 ug/mL-FEU (ref 0.00–0.50)

## 2023-03-24 LAB — TROPONIN I (HIGH SENSITIVITY): Troponin I (High Sensitivity): 7 ng/L (ref <18)

## 2023-03-24 MED ORDER — LIDOCAINE VISCOUS HCL 2 % MT SOLN
15.0000 mL | Freq: Once | OROMUCOSAL | Status: AC
  Administered 2023-03-24: 15 mL via OROMUCOSAL
  Filled 2023-03-24: qty 15

## 2023-03-24 MED ORDER — ALUM & MAG HYDROXIDE-SIMETH 200-200-20 MG/5ML PO SUSP
30.0000 mL | Freq: Once | ORAL | Status: AC
  Administered 2023-03-24: 30 mL via ORAL
  Filled 2023-03-24: qty 30

## 2023-03-24 MED ORDER — METOCLOPRAMIDE HCL 5 MG/ML IJ SOLN
10.0000 mg | Freq: Once | INTRAMUSCULAR | Status: AC
  Administered 2023-03-24: 10 mg via INTRAVENOUS
  Filled 2023-03-24: qty 2

## 2023-03-24 MED ORDER — SODIUM CHLORIDE 0.9 % IV BOLUS
1000.0000 mL | Freq: Once | INTRAVENOUS | Status: AC
  Administered 2023-03-24: 1000 mL via INTRAVENOUS

## 2023-03-24 MED ORDER — METOCLOPRAMIDE HCL 10 MG PO TABS: 10.0000 mg | ORAL_TABLET | Freq: Four times a day (QID) | ORAL | 0 refills | Status: DC | PRN

## 2023-03-24 MED ORDER — LIDOCAINE VISCOUS HCL 2 % MT SOLN: 15.0000 mL | OROMUCOSAL | 2 refills | Status: DC | PRN

## 2023-03-24 MED ORDER — HEPARIN SOD (PORK) LOCK FLUSH 100 UNIT/ML IV SOLN
INTRAVENOUS | Status: AC
  Administered 2023-03-24: 500 [IU]
  Filled 2023-03-24: qty 5

## 2023-03-24 NOTE — ED Triage Notes (Signed)
Pt reports chest pain, shob, and emesis. Pt reports positive COVID test on Thursday.

## 2023-03-24 NOTE — Discharge Instructions (Addendum)
Continue taking your Protonix.  I also recommend using the lidocaine prescribed as needed.  You may use 1 tablespoon of lidocaine mixed with 2 tablespoons of Maalox or Mylanta every 6 hours if needed for burning abdominal and esophageal pain.  Ensure you are drinking plenty of fluids to avoid dehydration.  Continue using your Imodium if needed for return of diarrhea.

## 2023-03-24 NOTE — ED Provider Notes (Signed)
[place] EMERGENCY DEPARTMENT AT [facility] Provider Note   CSN: [id number] Arrival date & time: 03/24/23  1037     History  Chief Complaint  Patient presents with   Chest Pain    Denise [name] is a 72 y.o. female with history including hypertension, asthma, hypothyroidism and breast cancer, currently undergoing chemotherapy under the care of Dr. Ellin Saba, last treatment occurring July 1 then was diagnosed with COVID on July 4.  She has had nausea and vomiting along with diarrhea, was seen at an outside ED 4 days ago and was placed on Zofran which she states has not really been effective for her symptoms.  She continues to have multiple episodes of nonbloody vomiting and watery diarrhea daily.  Today she has complaints of chest pain and shortness of breath.  She states she woke from sleep last night with a burning pain across to her anterior left and right chest in association with shortness of breath which she was not having yesterday.  She had not vomited just prior to these new symptoms.  She denies fevers, she does endorse generalized weakness.  She denies cough, wheezing, sore throat.  She is found no alleviators for symptoms.  The history is provided by the patient.       Home Medications Prior to Admission medications   Medication Sig Start Date End Date Taking? Authorizing Provider  lidocaine (XYLOCAINE) 2 % solution Use as directed 15 mLs in the mouth or throat as needed (reflux). 03/24/23  Yes Avrielle Fry, [name], PA-C  metoCLOPramide (REGLAN) 10 MG tablet Take 1 tablet (10 mg total) by mouth every 6 (six) hours as needed for nausea or vomiting. 03/24/23  Yes Coral Soler, [name], PA-C  Acetaminophen (TYLENOL PO) Take 1 tablet by mouth as needed.    [provider]  amoxicillin-clavulanate (AUGMENTIN) 875-125 MG tablet Take 1 tablet by mouth 2 (two) times daily. 01/04/23   [name], MD  gabapentin (NEURONTIN) 300 MG capsule Take 1 capsule by mouth 2 (two)  times daily. 11/05/20   [provider]  hydrALAZINE (APRESOLINE) 25 MG tablet Take 25 mg by mouth 3 (three) times daily. 07/16/22   [provider]  levothyroxine (SYNTHROID) 75 MCG tablet Take 100 mcg by mouth daily before breakfast.    [provider]  Melatonin 5 MG TABS Take 10 mg by mouth. As needed for sleep    [provider]  midodrine (PROAMATINE) 10 MG tablet Take 10 mg by mouth daily. 02/11/23   [provider]  olmesartan-hydrochlorothiazide (BENICAR HCT) 40-25 MG tablet Take 1 tablet by mouth daily. 07/16/22   [provider]  pantoprazole (PROTONIX) 40 MG tablet Take 40 mg by mouth daily. 07/23/22   [provider]  predniSONE (DELTASONE) 50 MG tablet Take one tab po 13 hours prior, then one tab 7 hours prior, and then one tab 1 hour prior to MRI 12/03/22   Ronny Bacon, PA-C      Allergies    Exforge [amlodipine besylate-valsartan], Gadolinium derivatives, Gadopiclenol, Tape, Cheese, Levofloxacin in d5w, Other, Proanthocyanidin, Strawberry extract, Wild lettuce extract (lactuca virosa), and Yeast-related products    Review of Systems   Review of Systems  Constitutional:  Positive for fatigue. Negative for fever.  HENT:  Negative for congestion and sore throat.   Eyes: Negative.   Respiratory:  Positive for chest tightness and shortness of breath.   Cardiovascular:  Positive for chest pain.  Gastrointestinal:  Positive for diarrhea, nausea and vomiting. Negative  for abdominal pain.  Genitourinary: Negative.  Negative for dysuria.  Musculoskeletal:  Negative for arthralgias, joint swelling and neck pain.  Skin: Negative.  Negative for rash and wound.  Neurological:  Positive for weakness. Negative for dizziness, light-headedness, numbness and headaches.  Psychiatric/Behavioral: Negative.      Physical Exam Updated Vital Signs BP (!) 145/98   [name] 72   Temp 97.7 F (36.5 C)   Resp 17   SpO2 99%   Physical Exam Vitals and nursing note reviewed.  Constitutional:      Appearance: She is well-developed.  HENT:     Head: Normocephalic and atraumatic.  Eyes:     Conjunctiva/sclera: Conjunctivae normal.  Cardiovascular:     Rate and Rhythm: Normal rate and regular rhythm.     Heart sounds: Normal heart sounds.  Pulmonary:     Effort: Pulmonary effort is normal.     Breath sounds: No decreased breath sounds, wheezing or rhonchi.  Abdominal:     General: Bowel sounds are normal.     Palpations: Abdomen is soft.     Tenderness: There is no abdominal tenderness.  Musculoskeletal:        General: Normal range of motion.     Cervical back: Normal range of motion.  Skin:    General: Skin is warm and dry.  Neurological:     Mental Status: She is alert.     ED Results / Procedures / Treatments   Labs (all labs ordered are listed, but only abnormal results are displayed) Labs Reviewed  BASIC METABOLIC PANEL - Abnormal; Notable for the following components:      Result Value   Sodium 134 (*)    CO2 19 (*)    Calcium 8.4 (*)    All other components within normal limits  CBC - Abnormal; Notable for the following components:   WBC 2.2 (*)    RBC 3.17 (*)    Hemoglobin 11.3 (*)    HCT 33.1 (*)    MCV 104.4 (*)    MCH 35.6 (*)    Platelets 116 (*)    All other components within normal limits  BRAIN NATRIURETIC PEPTIDE  D-DIMER, QUANTITATIVE  TROPONIN I (HIGH SENSITIVITY)    EKG None  Radiology DG Chest Port 1 View  Result Date: 03/24/2023 CLINICAL DATA:  Chest pain and shortness of breath.  COVID positive. EXAM: PORTABLE CHEST 1 VIEW COMPARISON:  CT chest dated Jan 29, 2023. Chest x-ray dated January 04, 2023. FINDINGS: Unchanged right chest wall port catheter. Unchanged mild cardiomegaly. Normal pulmonary vascularity. Unchanged small calcified granuloma in the left upper lobe and 1.1 cm hamartoma in the left lower lobe. No focal consolidation, pleural effusion, or  pneumothorax. No acute osseous abnormality. Unchanged right posterior sixth rib metastasis. IMPRESSION: 1. No active disease. Electronically Signed   By: Obie Dredge M.D.   On: 03/24/2023 11:50    Procedures Procedures    Medications Ordered in ED Medications  metoCLOPramide (REGLAN) injection 10 mg (10 mg Intravenous Given 03/24/23 1150)  sodium chloride 0.9 % bolus 1,000 mL (1,000 mLs Intravenous New Bag/Given 03/24/23 1149)  alum & mag hydroxide-simeth (MAALOX/MYLANTA) 200-200-20 MG/5ML suspension 30 mL (30 mLs Oral Given 03/24/23 1238)  lidocaine (XYLOCAINE) 2 % viscous mouth solution 15 mL (15 mLs Mouth/Throat Given 03/24/23 1238)    ED Course/ Medical Decision Making/ A&P  Medical Decision Making Pt presenting with nausea and vomiting, diarrhea which has been present for 6 days, was diagnosed with COVID at that time.  She has been treated with Zofran which has not been particularly effective for her nausea or vomiting.  Presenting with burning bilateral chest pain and shortness of breath which started last night.  Concerning potential for aspiration pneumonia, less likely to be secondary to ACS, she is a cancer patient currently, this could also represent PE.  She was given IV fluids here while awaiting labs and imaging.  She was also given Reglan IV and had complete resolution of the nausea and vomiting.  She continued to have some mild burning pain in her chest which was completely a alleviated with Maalox and viscous lidocaine.  She was symptom-free at time of discharge.  Prior to discharge she did mention that she was told by Dr. Kirtland Bouchard that her current chemo regimen can cause generalized gastritis.  She is currently on Protonix.  She was encouraged to continue this medication.  She has Maalox at home, I will prescribe her lidocaine which she may add to the Maalox as needed, she is also prescribed Reglan to use in place of Zofran.  Amount and/or Complexity of  Data Reviewed Labs: ordered.    Details: Labs are reassuring, she does have a leukopenia with a WBC of 2.2 hemoglobin is 11.3.  Troponin is negative, her BNP and D-dimers are also normal range.  Be met is also reassuring with no significant electrolyte abnormalities or concern for severe dehydration. Radiology: ordered.  Risk OTC drugs. Prescription drug management.           Final Clinical Impression(s) / ED Diagnoses Final diagnoses:  Gastritis without bleeding, unspecified chronicity, unspecified gastritis type  Nausea and vomiting, unspecified vomiting type    Rx / DC Orders ED Discharge Orders          Ordered    metoCLOPramide (REGLAN) 10 MG tablet  Every 6 hours PRN        03/24/23 1250    lidocaine (XYLOCAINE) 2 % solution  As needed        03/24/23 1250              Burgess Amor, Cordelia Poche 03/24/23 1323    Kommor, Wyn Forster, MD 03/24/23 1454

## 2023-03-28 ENCOUNTER — Encounter (HOSPITAL_COMMUNITY): Payer: Self-pay | Admitting: Emergency Medicine

## 2023-03-28 ENCOUNTER — Other Ambulatory Visit: Payer: Self-pay

## 2023-03-28 ENCOUNTER — Emergency Department (HOSPITAL_COMMUNITY)
Admission: EM | Admit: 2023-03-28 | Discharge: 2023-03-28 | Disposition: A | Discharge status: Home / Self Care | Payer: Medicare Other | Source: Home / Self Care | Attending: Emergency Medicine | Admitting: Emergency Medicine

## 2023-03-28 ENCOUNTER — Emergency Department (HOSPITAL_COMMUNITY): Payer: Medicare Other

## 2023-03-28 DIAGNOSIS — K5730 Diverticulosis of large intestine without perforation or abscess without bleeding: Secondary | ICD-10-CM | POA: Diagnosis not present

## 2023-03-28 DIAGNOSIS — Z853 Personal history of malignant neoplasm of breast: Secondary | ICD-10-CM | POA: Insufficient documentation

## 2023-03-28 DIAGNOSIS — R109 Unspecified abdominal pain: Secondary | ICD-10-CM | POA: Diagnosis not present

## 2023-03-28 DIAGNOSIS — K2900 Acute gastritis without bleeding: Secondary | ICD-10-CM

## 2023-03-28 DIAGNOSIS — K2960 Other gastritis without bleeding: Secondary | ICD-10-CM | POA: Diagnosis not present

## 2023-03-28 DIAGNOSIS — I10 Essential (primary) hypertension: Secondary | ICD-10-CM | POA: Diagnosis not present

## 2023-03-28 DIAGNOSIS — R1013 Epigastric pain: Secondary | ICD-10-CM | POA: Diagnosis not present

## 2023-03-28 LAB — COMPREHENSIVE METABOLIC PANEL
ALT: 31 U/L (ref 0–44)
AST: 32 U/L (ref 15–41)
Albumin: 3.4 g/dL — ABNORMAL LOW (ref 3.5–5.0)
Alkaline Phosphatase: 158 U/L — ABNORMAL HIGH (ref 38–126)
Anion gap: 10 (ref 5–15)
BUN: 17 mg/dL (ref 8–23)
CO2: 20 mmol/L — ABNORMAL LOW (ref 22–32)
Calcium: 8.5 mg/dL — ABNORMAL LOW (ref 8.9–10.3)
Chloride: 105 mmol/L (ref 98–111)
Creatinine, Ser: 0.75 mg/dL (ref 0.44–1.00)
GFR, Estimated: >60 mL/min (ref >60)
Glucose, Bld: 86 mg/dL (ref 70–99)
Potassium: 3.8 mmol/L (ref 3.5–5.1)
Sodium: 135 mmol/L (ref 135–145)
Total Bilirubin: 0.7 mg/dL (ref 0.3–1.2)
Total Protein: 6.8 g/dL (ref 6.5–8.1)

## 2023-03-28 LAB — CBC
HCT: 32.9 % — ABNORMAL LOW (ref 36.0–46.0)
Hemoglobin: 11.2 g/dL — ABNORMAL LOW (ref 12.0–15.0)
MCH: 34.9 pg — ABNORMAL HIGH (ref 26.0–34.0)
MCHC: 34 g/dL (ref 30.0–36.0)
MCV: 102.5 fL — ABNORMAL HIGH (ref 80.0–100.0)
Platelets: 154 10*3/uL (ref 150–400)
RBC: 3.21 MIL/uL — ABNORMAL LOW (ref 3.87–5.11)
RDW: 12.7 % (ref 11.5–15.5)
WBC: 1.9 10*3/uL — ABNORMAL LOW (ref 4.0–10.5)
nRBC: 0 % (ref 0.0–0.2)

## 2023-03-28 LAB — LIPASE, BLOOD: Lipase: 37 U/L (ref 11–51)

## 2023-03-28 MED ORDER — IOHEXOL 300 MG/ML  SOLN
100.0000 mL | Freq: Once | INTRAMUSCULAR | Status: AC | PRN
  Administered 2023-03-28: 100 mL via INTRAVENOUS

## 2023-03-28 MED ORDER — PROMETHAZINE HCL 25 MG RE SUPP: 25.0000 mg | Freq: Four times a day (QID) | RECTAL | 0 refills | Status: DC | PRN

## 2023-03-28 MED ORDER — OXYCODONE-ACETAMINOPHEN 5-325 MG PO TABS: 1.0000 | ORAL_TABLET | Freq: Four times a day (QID) | ORAL | 0 refills | Status: DC | PRN

## 2023-03-28 MED ORDER — SODIUM CHLORIDE 0.9 % IV BOLUS
1000.0000 mL | Freq: Once | INTRAVENOUS | Status: AC
  Administered 2023-03-28: 1000 mL via INTRAVENOUS

## 2023-03-28 MED ORDER — HYDROMORPHONE HCL 1 MG/ML IJ SOLN
0.5000 mg | Freq: Once | INTRAMUSCULAR | Status: AC
  Administered 2023-03-28: 0.5 mg via INTRAVENOUS
  Filled 2023-03-28: qty 0.5

## 2023-03-28 MED ORDER — PANTOPRAZOLE SODIUM 40 MG IV SOLR
40.0000 mg | Freq: Once | INTRAVENOUS | Status: AC
  Administered 2023-03-28: 40 mg via INTRAVENOUS
  Filled 2023-03-28: qty 10

## 2023-03-28 MED ORDER — SUCRALFATE 1 GM/10ML PO SUSP: 1.0000 g | Freq: Three times a day (TID) | ORAL | 0 refills | Status: DC

## 2023-03-28 MED ORDER — ONDANSETRON HCL 4 MG/2ML IJ SOLN
4.0000 mg | Freq: Once | INTRAMUSCULAR | Status: AC
  Administered 2023-03-28: 4 mg via INTRAVENOUS
  Filled 2023-03-28: qty 2

## 2023-03-28 NOTE — ED Provider Notes (Signed)
[place] EMERGENCY DEPARTMENT AT [facility] Provider Note   CSN: [id number] Arrival date & time: 03/28/23  1101     History  Chief Complaint  Patient presents with   Abdominal Pain    Denise [name] is a 72 y.o. female.  Patient complains of burning in her stomach with nausea and vomiting.  This has been going on for over a week.  Patient has a history of breast cancer and is getting chemotherapy  The history is provided by the patient and medical records. No language interpreter was used.  Abdominal Pain Pain location:  Epigastric Pain quality: aching   Pain radiates to:  Does not radiate Pain severity:  Moderate Onset quality:  Gradual Timing:  Constant Progression:  Waxing and waning Chronicity:  New Context: not alcohol use   Associated symptoms: no chest pain, no cough, no diarrhea, no fatigue and no hematuria        Home Medications Prior to Admission medications   Medication Sig Start Date End Date Taking? Authorizing Provider  oxyCODONE-acetaminophen (PERCOCET/ROXICET) 5-325 MG tablet Take 1 tablet by mouth every 6 (six) hours as needed for severe pain. 03/28/23  Yes Bethann Berkshire, MD  promethazine (PHENERGAN) 25 MG suppository Place 1 suppository (25 mg total) rectally every 6 (six) hours as needed for nausea or vomiting. 03/28/23  Yes Bethann Berkshire, MD  sucralfate (CARAFATE) 1 GM/10ML suspension Take 10 mLs (1 g total) by mouth 4 (four) times daily -  with meals and at bedtime. 03/28/23  Yes Bethann Berkshire, MD  Acetaminophen (TYLENOL PO) Take 1 tablet by mouth as needed.    [provider]  amoxicillin-clavulanate (AUGMENTIN) 875-125 MG tablet Take 1 tablet by mouth 2 (two) times daily. 01/04/23   [name], MD  gabapentin (NEURONTIN) 300 MG capsule Take 1 capsule by mouth 2 (two) times daily. 11/05/20   [provider]  hydrALAZINE (APRESOLINE) 25 MG tablet Take 25 mg by mouth 3 (three) times daily. 07/16/22   [provider]  levothyroxine (SYNTHROID) 75 MCG tablet Take 100 mcg by mouth daily before breakfast.    [provider]  lidocaine (XYLOCAINE) 2 % solution Use as directed 15 mLs in the mouth or throat as needed (reflux). 03/24/23   Burgess Amor, PA-C  Melatonin 5 MG TABS Take 10 mg by mouth. As needed for sleep    [provider]  metoCLOPramide (REGLAN) 10 MG tablet Take 1 tablet (10 mg total) by mouth every 6 (six) hours as needed for nausea or vomiting. 03/24/23   Idol, [name], PA-C  midodrine (PROAMATINE) 10 MG tablet Take 10 mg by mouth daily. 02/11/23   [provider]  olmesartan-hydrochlorothiazide (BENICAR HCT) 40-25 MG tablet Take 1 tablet by mouth daily. 07/16/22   [provider]  pantoprazole (PROTONIX) 40 MG tablet Take 40 mg by mouth daily. 07/23/22   [provider]  predniSONE (DELTASONE) 50 MG tablet Take one tab po 13 hours prior, then one tab 7 hours prior, and then one tab 1 hour prior to MRI 12/03/22   Ronny Bacon, PA-C      Allergies    Exforge [amlodipine besylate-valsartan], Gadolinium derivatives, Gadopiclenol, Tape, Cheese, Levofloxacin in d5w, Other, Proanthocyanidin, Strawberry extract, Wild lettuce extract (lactuca virosa), and Yeast-related products    Review of Systems   Review of Systems  Constitutional:  Negative for appetite change and fatigue.  HENT:  Negative for congestion, ear discharge and sinus pressure.   Eyes:  Negative  for discharge.  Respiratory:  Negative for cough.   Cardiovascular:  Negative for chest pain.  Gastrointestinal:  Positive for abdominal pain. Negative for diarrhea.  Genitourinary:  Negative for frequency and hematuria.  Musculoskeletal:  Negative for back pain.  Skin:  Negative for rash.  Neurological:  Negative for seizures and headaches.  Psychiatric/Behavioral:  Negative for hallucinations.     Physical Exam Updated Vital Signs BP (!) 153/84   Pulse 78   Temp 98.4 F  (36.9 C) (Oral)   Resp 18   Ht 5\' 5"  (1.651 m)   Wt 75.3 kg   SpO2 97%   BMI 27.62 kg/m  Physical Exam Vitals and nursing note reviewed.  Constitutional:      Appearance: She is well-developed.  HENT:     Head: Normocephalic.     Nose: Nose normal.  Eyes:     General: No scleral icterus.    Conjunctiva/sclera: Conjunctivae normal.  Neck:     Thyroid: No thyromegaly.  Cardiovascular:     Rate and Rhythm: Normal rate and regular rhythm.     Heart sounds: No murmur heard.    No friction rub. No gallop.  Pulmonary:     Breath sounds: No stridor. No wheezing or rales.  Chest:     Chest wall: No tenderness.  Abdominal:     General: There is no distension.     Tenderness: There is abdominal tenderness. There is no rebound.  Musculoskeletal:        General: Normal range of motion.     Cervical back: Neck supple.  Lymphadenopathy:     Cervical: No cervical adenopathy.  Skin:    Findings: No erythema or rash.  Neurological:     Mental Status: She is alert and oriented to person, place, and time.     Motor: No abnormal muscle tone.     Coordination: Coordination normal.  Psychiatric:        Behavior: Behavior normal.     ED Results / Procedures / Treatments   Labs (all labs ordered are listed, but only abnormal results are displayed) Labs Reviewed  COMPREHENSIVE METABOLIC PANEL - Abnormal; Notable for the following components:      Result Value   CO2 20 (*)    Calcium 8.5 (*)    Albumin 3.4 (*)    Alkaline Phosphatase 158 (*)    All other components within normal limits  CBC - Abnormal; Notable for the following components:   WBC 1.9 (*)    RBC 3.21 (*)    Hemoglobin 11.2 (*)    HCT 32.9 (*)    MCV 102.5 (*)    MCH 34.9 (*)    All other components within normal limits  LIPASE, BLOOD  URINALYSIS, ROUTINE W REFLEX MICROSCOPIC    EKG None  Radiology CT ABDOMEN PELVIS W CONTRAST  Result Date: 03/28/2023 CLINICAL DATA:  Abdominal pain. EXAM: CT ABDOMEN AND  PELVIS WITH CONTRAST TECHNIQUE: Multidetector CT imaging of the abdomen and pelvis was performed using the standard protocol following bolus administration of intravenous contrast. RADIATION DOSE REDUCTION: This exam was performed according to the departmental dose-optimization program which includes automated exposure control, adjustment of the mA and/or kV according to patient size and/or use of iterative reconstruction technique. CONTRAST:  OMNIPAQUE IOHEXOL 300 MG/ML  SOLN COMPARISON:  Jan 29, 2023 FINDINGS: Lower chest: Subtle area of ground-glass opacities in the right middle lobe measures 2.0 cm. Other subtle areas of airspace consolidation versus atelectasis are seen  in a more linear fashion in the right lower lobe. Hepatobiliary: No focal liver abnormality is seen. Status post cholecystectomy. No biliary dilatation. Pancreas: Unremarkable. No pancreatic ductal dilatation or surrounding inflammatory changes. Spleen: Normal in size without focal abnormality. Adrenals/Urinary Tract: Adrenal glands are unremarkable. Kidneys are normal, without renal calculi, focal lesion, or hydronephrosis. Bladder is unremarkable. Stomach/Bowel: Stomach is within normal limits. There is no evidence of appendicitis. No evidence of bowel wall thickening, distention, or inflammatory changes. Left sigmoid colon diverticulosis without evidence of acute diverticulitis. Vascular/Lymphatic: No significant vascular findings are present. No enlarged abdominal or pelvic lymph nodes. Reproductive: Status post hysterectomy. No adnexal masses. Other: No abdominal wall hernia or abnormality. No abdominopelvic ascites. Musculoskeletal: Post left mastectomy. Again seen is heterogeneity and expansion of the posterior right sixth rib, T11 sclerotic lesion, and T8 lytic lesion. The skeletal findings are stable. Lytic lesion within the left iliac bone measures 1.6 cm. Right iliac subcentimeter lytic lesion is unchanged. 3.1 cm subcutaneous  fluid density mass in the left lateral abdominal wall. IMPRESSION: 1. No acute abnormality identified within the abdomen or pelvis. 2. Subtle area of ground-glass opacities in the right middle lobe measures 2.0 cm. Other subtle areas of airspace consolidation versus atelectasis are seen in a more linear fashion in the right lower lobe. These findings may represent an infectious or inflammatory process. Attention on follow-up is recommended. 3. Left sigmoid colon diverticulosis without evidence of acute diverticulitis. 4. Stable skeletal findings, consistent with metastatic disease. 5. 3.1 cm subcutaneous fluid density mass in the left lateral abdominal wall, benign by appearance. Electronically Signed   By: Ted Mcalpine M.D.   On: 03/28/2023 14:25    Procedures Procedures    Medications Ordered in ED Medications  HYDROmorphone (DILAUDID) injection 0.5 mg (0.5 mg Intravenous Given 03/28/23 1206)  ondansetron (ZOFRAN) injection 4 mg (4 mg Intravenous Given 03/28/23 1206)  pantoprazole (PROTONIX) injection 40 mg (40 mg Intravenous Given 03/28/23 1205)  iohexol (OMNIPAQUE) 300 MG/ML solution 100 mL (100 mLs Intravenous Contrast Given 03/28/23 1331)  sodium chloride 0.9 % bolus 1,000 mL (1,000 mLs Intravenous New Bag/Given 03/28/23 1444)    ED Course/ Medical Decision Making/ A&P                             Medical Decision Making Amount and/or Complexity of Data Reviewed Labs: ordered. Radiology: ordered.  Risk Prescription drug management.  This patient presents to the ED for concern of abdominal pain, this involves an extensive number of treatment options, and is a complaint that carries with it a high risk of complications and morbidity.  The differential diagnosis includes gastritis, metastatic cancer   Co morbidities that complicate the patient evaluation  Breast cancer   Additional history obtained:  Additional history obtained from patient External records from outside  source obtained and reviewed including hospital records   Lab Tests:  I Ordered, and personally interpreted labs.  The pertinent results include: Count 1.9, hemoglobin 11.2   Imaging Studies ordered:  I ordered imaging studies including CT abdomen I independently visualized and interpreted imaging which showed bone metastatic disease stable I agree with the radiologist interpretation   Cardiac Monitoring: / EKG:  The patient was maintained on a cardiac monitor.  I personally viewed and interpreted the cardiac monitored which showed an underlying rhythm of: Normal sinus rhythm   Consultations Obtained:  No consultant   Problem List / ED Course / Critical interventions /  Medication management  Abdominal pain I ordered medication including Dilaudid for pain Reevaluation of the patient after these medicines showed that the patient improved I have reviewed the patients home medicines and have made adjustments as needed   Social Determinants of Health:  Breast cancer   Test / Admission - Considered:  None  Patient with severe GERD and abdominal pain.  She will increase her Protonix so she is taking it twice a day and she will add Carafate and Phenergan and Percocet and follow-up with Dr. Ellin Saba this week       Final Clinical Impression(s) / ED Diagnoses Final diagnoses:  Other acute gastritis without hemorrhage    Rx / DC Orders ED Discharge Orders          Ordered    sucralfate (CARAFATE) 1 GM/10ML suspension  3 times daily with meals & bedtime        03/28/23 1447    promethazine (PHENERGAN) 25 MG suppository  Every 6 hours PRN        03/28/23 1447    oxyCODONE-acetaminophen (PERCOCET/ROXICET) 5-325 MG tablet  Every 6 hours PRN        03/28/23 1447              Bethann Berkshire, MD 03/29/23 1714

## 2023-03-28 NOTE — ED Notes (Signed)
Transported to CT 

## 2023-03-28 NOTE — Discharge Instructions (Addendum)
Increase your Protonix so you are taking it twice a day.  Follow-up with Dr. Ellin Saba this week

## 2023-03-28 NOTE — ED Triage Notes (Signed)
Pt via POV c/o 10/10 burning abdominal pain since she was discharged from the ER several days ago. Pt has decreased appetite and poor PO tolerance but denies vomiting. No other physical complaints at this time and pt says covid symptoms have completely resolved.

## 2023-03-29 ENCOUNTER — Inpatient Hospital Stay: Payer: Medicare Other

## 2023-03-29 ENCOUNTER — Telehealth: Payer: Self-pay | Admitting: *Deleted

## 2023-03-29 ENCOUNTER — Inpatient Hospital Stay (HOSPITAL_BASED_OUTPATIENT_CLINIC_OR_DEPARTMENT_OTHER): Payer: Medicare Other | Admitting: Hematology

## 2023-03-29 VITALS — BP 134/86 | HR 68 | Temp 98.6°F | Resp 22 | Wt 154.2 lb

## 2023-03-29 VITALS — BP 150/75 | HR 59 | Temp 98.8°F | Resp 20

## 2023-03-29 DIAGNOSIS — C50912 Malignant neoplasm of unspecified site of left female breast: Secondary | ICD-10-CM

## 2023-03-29 DIAGNOSIS — Z5189 Encounter for other specified aftercare: Secondary | ICD-10-CM | POA: Diagnosis not present

## 2023-03-29 DIAGNOSIS — R748 Abnormal levels of other serum enzymes: Secondary | ICD-10-CM | POA: Diagnosis not present

## 2023-03-29 DIAGNOSIS — C778 Secondary and unspecified malignant neoplasm of lymph nodes of multiple regions: Secondary | ICD-10-CM | POA: Diagnosis not present

## 2023-03-29 DIAGNOSIS — Z5112 Encounter for antineoplastic immunotherapy: Secondary | ICD-10-CM | POA: Diagnosis not present

## 2023-03-29 DIAGNOSIS — Z95828 Presence of other vascular implants and grafts: Secondary | ICD-10-CM

## 2023-03-29 DIAGNOSIS — R197 Diarrhea, unspecified: Secondary | ICD-10-CM

## 2023-03-29 DIAGNOSIS — E860 Dehydration: Secondary | ICD-10-CM

## 2023-03-29 DIAGNOSIS — C7951 Secondary malignant neoplasm of bone: Secondary | ICD-10-CM | POA: Diagnosis not present

## 2023-03-29 LAB — COMPREHENSIVE METABOLIC PANEL
ALT: 28 U/L (ref 0–44)
AST: 28 U/L (ref 15–41)
Albumin: 3.4 g/dL — ABNORMAL LOW (ref 3.5–5.0)
Alkaline Phosphatase: 157 U/L — ABNORMAL HIGH (ref 38–126)
Anion gap: 10 (ref 5–15)
BUN: 17 mg/dL (ref 8–23)
CO2: 20 mmol/L — ABNORMAL LOW (ref 22–32)
Calcium: 8.7 mg/dL — ABNORMAL LOW (ref 8.9–10.3)
Chloride: 105 mmol/L (ref 98–111)
Creatinine, Ser: 0.8 mg/dL (ref 0.44–1.00)
GFR, Estimated: >60 mL/min (ref >60)
Glucose, Bld: 82 mg/dL (ref 70–99)
Potassium: 3.6 mmol/L (ref 3.5–5.1)
Sodium: 135 mmol/L (ref 135–145)
Total Bilirubin: 0.8 mg/dL (ref 0.3–1.2)
Total Protein: 6.7 g/dL (ref 6.5–8.1)

## 2023-03-29 LAB — CBC WITH DIFFERENTIAL/PLATELET
Abs Immature Granulocytes: 0 10*3/uL (ref 0.00–0.07)
Basophils Absolute: 0 10*3/uL (ref 0.0–0.1)
Basophils Relative: 1 %
Eosinophils Absolute: 0.1 10*3/uL (ref 0.0–0.5)
Eosinophils Relative: 5 %
HCT: 32.5 % — ABNORMAL LOW (ref 36.0–46.0)
Hemoglobin: 10.9 g/dL — ABNORMAL LOW (ref 12.0–15.0)
Immature Granulocytes: 0 %
Lymphocytes Relative: 34 %
Lymphs Abs: 0.7 10*3/uL (ref 0.7–4.0)
MCH: 34.7 pg — ABNORMAL HIGH (ref 26.0–34.0)
MCHC: 33.5 g/dL (ref 30.0–36.0)
MCV: 103.5 fL — ABNORMAL HIGH (ref 80.0–100.0)
Monocytes Absolute: 0.3 10*3/uL (ref 0.1–1.0)
Monocytes Relative: 12 %
Neutro Abs: 1.1 10*3/uL — ABNORMAL LOW (ref 1.7–7.7)
Neutrophils Relative %: 48 %
Platelets: 156 10*3/uL (ref 150–400)
RBC: 3.14 MIL/uL — ABNORMAL LOW (ref 3.87–5.11)
RDW: 12.9 % (ref 11.5–15.5)
WBC: 2.2 10*3/uL — ABNORMAL LOW (ref 4.0–10.5)
nRBC: 0 % (ref 0.0–0.2)

## 2023-03-29 LAB — MAGNESIUM: Magnesium: 1.8 mg/dL (ref 1.7–2.4)

## 2023-03-29 MED ORDER — HEPARIN SOD (PORK) LOCK FLUSH 100 UNIT/ML IV SOLN
500.0000 [IU] | Freq: Once | INTRAVENOUS | Status: AC
  Administered 2023-03-29: 500 [IU] via INTRAVENOUS

## 2023-03-29 MED ORDER — SODIUM CHLORIDE 0.9 % IV SOLN: INTRAVENOUS | Status: DC

## 2023-03-29 MED ORDER — SODIUM CHLORIDE 0.9% FLUSH
10.0000 mL | INTRAVENOUS | Status: DC | PRN
  Administered 2023-03-29: 10 mL via INTRAVENOUS

## 2023-03-29 MED ORDER — SODIUM CHLORIDE 0.9 % IV SOLN
40.0000 mg | Freq: Once | INTRAVENOUS | Status: AC
  Administered 2023-03-29: 40 mg via INTRAVENOUS
  Filled 2023-03-29: qty 4

## 2023-03-29 MED ORDER — ONDANSETRON HCL 4 MG/2ML IJ SOLN
8.0000 mg | Freq: Once | INTRAMUSCULAR | Status: AC
  Administered 2023-03-29: 8 mg via INTRAVENOUS
  Filled 2023-03-29: qty 4

## 2023-03-29 NOTE — Telephone Encounter (Signed)
Patient called and advised that she had been in the ER yesterday for nausea, vomiting, loss of appetite and diarrhea.  She was advised by ER physician to call the office this morning if symptoms have not subsided.  Added on for labs, fluids and visit with Dr. Ellin Saba.

## 2023-03-29 NOTE — Patient Instructions (Addendum)
[facility] at [facility] Discharge Instructions   You were seen and examined today by Dr. Ellin Saba.  He reviewed the results of your lab work which are normal/stable.   We will give you IV fluids today. We will also give you Pepcid IV to help with the burning in your stomach.   Dr. Kirtland Bouchard wants you to pick up the prescriptions you were given in the ER yesterday. Use the suppositories every 6 hours and take the Carafate every 3-4 hours to coat your stomach.   We will have you come back tomorrow and the next day for IV fluids and anti-nausea medications IV.   Return as scheduled.     Thank you for choosing [facility] at [facility] to provide your oncology and hematology care.  To afford each patient quality time with our provider, please arrive at least 15 minutes before your scheduled appointment time.   If you have a lab appointment with the Cancer Center please come in thru the Main Entrance and check in at the main information desk.  You need to re-schedule your appointment should you arrive 10 or more minutes late.  We strive to give you quality time with our providers, and arriving late affects you and other patients whose appointments are after yours.  Also, if you no show three or more times for appointments you may be dismissed from the clinic at the providers discretion.     Again, thank you for choosing [facility].  Our hope is that these requests will decrease the amount of time that you wait before being seen by our physicians.       _____________________________________________________________  Should you have questions after your visit to [facility], please contact our office at [phone] and follow the prompts.  Our office hours are 8:00 a.m. and 4:30 p.m. Monday - Friday.  Please note that voicemails left after 4:00 p.m. may not be returned until the following business day.  We are closed weekends  and major holidays.  You do have access to a nurse 24-7, just call the main number to the clinic [phone] and do not press any options, hold on the line and a nurse will answer the phone.    For prescription refill requests, have your pharmacy contact our office and allow 72 hours.    Due to Covid, you will need to wear a mask upon entering the hospital. If you do not have a mask, a mask will be given to you at the Main Entrance upon arrival. For doctor visits, patients may have 1 support person age 2 or older with them. For treatment visits, patients can not have anyone with them due to social distancing guidelines and our immunocompromised population.

## 2023-03-29 NOTE — Progress Notes (Signed)
 Patients port flushed without difficulty.  Good blood return noted with no bruising or swelling noted at site.  VSS. Patient remains accessed for possible IVF.

## 2023-03-29 NOTE — Progress Notes (Signed)
NS1L with Potassium Chloride 20 MEq over 2 hours, Zofran 8mg  IVP and pepcid 40mg  in NS entered for 7/16 and 7/17 per MD request.  Richardean Sale, RPH, BCPS, BCOP 03/29/2023 11:45 AM

## 2023-03-29 NOTE — Progress Notes (Signed)
[facility] [address], [place] [address]    Clinic Day:  03/29/2023  Referring physician: Toma Deiters, MD  Patient Care Team: Toma Deiters, MD as PCP - General (Internal Medicine) [name], MD as Medical Oncologist (Medical Oncology)   ASSESSMENT & PLAN:   Assessment: 1.  T3N3c (stage IIIc) triple negative invasive lobular carcinoma of the left breast: - She felt lump in her left breast for more than 6 months, but thought it was secondary to fibrocystic disease which she had all her life.  When she started having pain, she reached out to Dr. Olena Leatherwood. - Ultrasound-guided left breast and left axillary lymph node biopsy on 01/15/2021 - Pathology consistent with invasive lobular carcinoma, E-cadherin negative.  ER/PR/HER2 negative.  HER2 2+ by IHC, negative by FISH.  Ki-67 is 5%.  Lymph node core biopsy was consistent with metastatic carcinoma.  Grade 2. - PET scan on 02/03/2021 showed involvement of left supraclavicular, subpectoral, axillary lymph nodes along with the breast mass.  10 mm left lung nodule which is hypometabolic.  Spinal cord lesion at T12 level with a strong uptake. - MRI of the lumbar spine with and without contrast on 02/20/2021 showed no mass or abnormal enhancement within the canal at the T12 level to correspond to the site of PET scan positive.  No marrow replacing bone lesion. - 12 weeks of carboplatin and paclitaxel weekly and every 3 weeks pembrolizumab followed by 4 cycles of dose dense AC with pembrolizumab ([id number]) from 02/27/2021 through 10/01/2021 - PET scan on 10/16/2021: Reduction in metabolic activity in the size of the left axillary lymph node and no evidence of breast hypermetabolism on PET scan. - Left mastectomy and lymph node excision by Dr. Magnus Ivan on 11/20/2021 - Pathology: 6.2 cm invasive lobular carcinoma, grade 2, margins negative.  19/21 lymph nodes involved with macrometastasis.  ypT3, YPN3A - Adjuvant  pembrolizumab started on 01/01/2022. -CREATE-X capecitabine 1500 mg twice daily 2 weeks on/1 week off started on 01/01/2022.  Last dose of pembrolizumab on 05/11/2022 - Germline mutation testing ([facility]) negative - [employer] (06/04/2022): T p53, K-ras V14 I, CDKN2A.  MSI-high not detected. - PET scan (05/28/2022): Multifocal bone lesions involving T11 vertebral body, right posterior sixth rib, left femoral neck, left scapular glenoid.  No other metastatic disease. - Sacituzumab cycle 1 on 08/18/2022.  She was hospitalized with neutropenic fever and severe diarrhea from 08/24/2022 through 08/27/2022. - CTAP (10/25/2021): Generalized mild to moderate wall thickening throughout the nondistended large bowel with faint pericolonic fat haziness compatible with nonspecific infectious/inflammatory colitis.  No free air.  C. difficile x 2 was negative. - She has UG T1 A1*28 heterozygosity. - Cycle 2 Sacituzumab dose reduced by 50%.  Discontinued secondary to poor tolerance. - Enhertu 3.2 Mg/KG cycle 1 on 11/04/2022   2.  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  She is non-smoker. - Mother died of breast cancer.  Maternal grandmother died very young in her 25s, sister has fibrocystic disease.  Father died of lung cancer and was a smoker.    Plan: 1.  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 - Developed sore throat on 03/18/2023, tested positive for COVID at home. - She went to the ER on 03/20/2023 at [facility] with nausea vomiting and diarrhea.  Given some IV fluids. - She went to the ER at [facility] on 03/24/2023.  She had complained of abdominal pain, nausea vomiting and diarrhea. - She presented to the ER again on 03/28/2023: CT AP left sigmoid diverticulosis without evidence of diverticulitis. - She reports abdominal pain, mostly in the epigastric region and burning type.  Also has some nausea and vomiting but  denied any diarrhea.  GI cocktail given in the ER helped for 2 to 3 hours.  She was also given lidocaine with Maalox which helps for 30 minutes. - Yesterday she was given Carafate and Phenergan suppository which she has not filled yet. - Reviewed labs today: Normal LFTs except elevated alk phos.  Creatinine normal.  CBC shows white count is low at 2.2 and ANC of 1.1.  Platelet count was normal.  Hemoglobin is close to 11. - Will give her Pepcid 40 mg IV and Phenergan suppository.  She was also given IV Zofran and IV fluids with electrolytes today. - I will arrange her for fluids with electrolytes, Pepcid and Zofran daily tomorrow and Wednesday.  2.  Brain lesions (SRS to 3 brain lesions on 06/16/2022): - Repeat MRI scheduled on 04/19/2023.  3.  Peripheral neuropathy: - Continue gabapentin 300 mg at bedtime.  4.  Hypomagnesemia: - Continue magnesium supplements.  Magnesium is normal today.    No orders of the defined types were placed in this encounter.      Alben Deeds Teague,acting as a Neurosurgeon for [name], MD.,have documented all relevant documentation on the behalf of [name], MD,as directed by  [name], MD while in the presence of [name], MD.  I, [name] MD, have reviewed the above documentation for accuracy and completeness, and I agree with the above.    [name], MD   7/15/20245:19 PM  CHIEF COMPLAINT:   Diagnosis: Metastatic triple negative breast cancer to the bones    Cancer Staging  Invasive lobular carcinoma of left breast in female [facility]) Staging form: Breast, AJCC 8th Edition - Clinical stage from 01/23/2021: cT3, cN3c, G2, ER-, PR-, HER2- - Unsigned - Pathologic stage from 12/22/2021: No Stage Recommended (ypT3, pN3a, cM0, G2, ER-, PR-, HER2-) - Signed by [name], MD on 12/22/2021    Prior Therapy: 1.  Neoadjuvant chemotherapy with weekly carboplatin and paclitaxel and dose dense AC  with pembrolizumab 2. Left mastectomy and lymph node excision by Dr. Magnus Ivan on 11/20/2021 3.  Sacituzumab on 08/18/2022 and 10/08/2021, discontinued due to poor tolerance.  Current Therapy:  Enhertu every 21 days    HISTORY OF PRESENT ILLNESS:   Oncology History  Invasive lobular carcinoma of left breast in female [facility])  01/23/2021 Initial Diagnosis   Invasive lobular carcinoma of left breast in female [facility])   02/19/2021 Genetic Testing   Negative genetic testing on the CancerNext-Expanded+RNAinsight panel.  SMARCB1 VUS identified.  The CancerNext-Expanded gene panel offered by [facility] and includes sequencing and rearrangement analysis for the following 77 genes: AIP, ALK, APC*, ATM*, AXIN2, BAP1, BARD1, BLM, BMPR1A, BRCA1*, BRCA2*, BRIP1*, CDC73, CDH1*, CDK4, CDKN1B, CDKN2A, CHEK2*, CTNNA1, DICER1, FANCC, FH, FLCN, GALNT12, KIF1B, LZTR1, MAX, MEN1, MET, MLH1*, MSH2*, MSH3, MSH6*, MUTYH*, NBN, NF1*, NF2, NTHL1, PALB2*, PHOX2B, PMS2*, POT1, PRKAR1A, PTCH1, PTEN*, RAD51C*, RAD51D*, RB1, RECQL, RET, SDHA, SDHAF2, SDHB, SDHC, SDHD, SMAD4, SMARCA4, SMARCB1, SMARCE1, STK11, SUFU, TMEM127, TP53*, TSC1, TSC2, VHL and XRCC2 (sequencing and deletion/duplication); EGFR, EGLN1, HOXB13, KIT, MITF, PDGFRA, POLD1, and POLE (sequencing only); EPCAM and GREM1 (deletion/duplication only). DNA and RNA analyses performed for * genes. The report date is February 19, 2021.  02/27/2021 - 05/11/2022 Chemotherapy   Patient is on Treatment Plan : BREAST Pembrolizumab + Carboplatin D1,8,15+ Paclitaxel D1,8,15 q21d X 4 cycles / Pembrolizumab + AC q21d x 4 cycles     12/22/2021 Cancer Staging   Staging form: Breast, AJCC 8th Edition - Pathologic stage from 12/22/2021: No Stage Recommended (ypT3, pN3a, cM0, G2, ER-, PR-, HER2-) - Signed by [name], MD on 12/22/2021 Histopathologic type: Lobular carcinoma, NOS Stage prefix: Post-therapy Method of lymph node assessment: Axillary lymph node dissection Multigene  prognostic tests performed: None Histologic grading system: 3 grade system   08/18/2022 - 10/08/2022 Chemotherapy   Patient is on Treatment Plan : BREAST METASTATIC Sacituzumab govitecan-hziy Drinda Butts) D1,8 q21d     11/04/2022 -  Chemotherapy   Patient is on Treatment Plan : BREAST METASTATIC Fam-Trastuzumab Deruxtecan-nxki (Enhertu) (5.4) q21d        INTERVAL HISTORY:   Denise [name] is a 72 y.o. female presenting to clinic today for follow up of Metastatic triple negative breast cancer to the bones. She was last seen by me on 03/15/23.  Since her last visit, she was admitted to the ED on 7/14 for acute gastritis without hemorrhage. CT found no acute abnormality identified within the abdomen or pelvis, subtle area of ground-glass opacities in the right middle lobe measures 2.0 cm, other subtle areas of airspace consolidation versus atelectasis seen in a more linear fashion in the right lower lobe, left sigmoid colon diverticulosis without evidence of acute diverticulitis, stable skeletal findings, consistent with metastatic disease, and 3.1 cm subcutaneous fluid density mass in the left lateral abdominal wall, benign by appearance. CBC on 7/14 showed abnormal WBC at 1.9, RBC at 3.21, hemoglobin at 11.2, hematocrit at 32.9, MCV at 102.5, and MCH at 34.9. She was discharged on 7/14.  She was also seen in the ED on 7/10 for gastritis with nausea and vomiting, and without hemorrhage. She was previously treated with Zofran from a hospital visit on 7/6 for gastritis and states that it had not been effective. She was prescribed lidocaine, Maalox prn, and Reglan in place of Zofran.   Today, she states that she is doing well overall. Her appetite level is at 0%. Her energy level is at 0%. She is accompanied by her daughter.  She c/o severe gastritis with burning abdominal pain and constant vomiting that is yellow. She notes associated abdominal spasms and severe headaches. She reports that at first she had  watery diarrhea that is now formed.  She reports her diarrhea has stopped. She denies any blood in the stools but notes they are yellow. When she attempts to eat, it is painful and she vomits almost immediately. She notes that after vomiting, burning abdominal pain worsens. She reports that combination of lidocaine, Maalox and Donetol improved pain and vomiting for 2-3 hours. Zofran did not improve nausea. She notes that lidocaine and Maalox helped for 1/2 an hour, but have otherwise not improved symptoms. She does not believe IV fluids have improved symptoms. She has been unable to keep down any of her medications or food for the last 9 days and reports significant weight loss. She has not used suppositories in a long time. She notes her sputum is very thick and makes it difficult to swallow. She has not taken Oxycodone and does not plan to take it in the future. She has taken multiple nausea medications and notes she cannot keep food down for more than an hour or 2.   PAST MEDICAL HISTORY:   Past  Medical History: Past Medical History:  Diagnosis Date   Asthma    as child   Cancer (HCC) 12/2020   left breast IMC   Complication of anesthesia    pateitn states,' I coded when I had my D&Cmany years ago.   Dyspnea    Family history of breast cancer    Hypertension    Hypothyroidism    PONV (postoperative nausea and vomiting)    Port-A-Cath in place 02/26/2021    Surgical History: Past Surgical History:  Procedure Laterality Date   ABDOMINAL HYSTERECTOMY     BIOPSY  01/17/2018   Procedure: BIOPSY;  Surgeon: Malissa Hippo, MD;  [facility];  Service: Endoscopy;;  duodenum,gastric   CHOLECYSTECTOMY     COLONOSCOPY WITH PROPOFOL N/A 01/17/2018   Procedure: COLONOSCOPY WITH PROPOFOL;  Surgeon: Malissa Hippo, MD;  [facility];  Service: Endoscopy;  Laterality: N/A;  7:30   DILATION AND CURETTAGE OF UTERUS     ESOPHAGOGASTRODUODENOSCOPY (EGD) WITH PROPOFOL N/A 01/17/2018    Procedure: ESOPHAGOGASTRODUODENOSCOPY (EGD) WITH PROPOFOL;  Surgeon: Malissa Hippo, MD;  [facility];  Service: Endoscopy;  Laterality: N/A;   POLYPECTOMY  01/17/2018   Procedure: POLYPECTOMY;  Surgeon: Malissa Hippo, MD;  [facility];  Service: Endoscopy;;  transverse colon, cecal   PORTACATH PLACEMENT Right 02/17/2021   Procedure: INSERTION PORT-A-CATH;  Surgeon: Abigail Miyamoto, MD;  Location: [facility];  Service: General;  Laterality: Right;   RADIOACTIVE SEED GUIDED AXILLARY SENTINEL LYMPH NODE Left 11/20/2021   Procedure: RADIOACTIVE SEED GUIDED LEFT AXILLARY SENTINEL LYMPH NODE DISSECTION;  Surgeon: Abigail Miyamoto, MD;  Location: [facility];  Service: General;  Laterality: Left;   TOTAL MASTECTOMY Left 11/20/2021   Procedure: LEFT TOTAL MASTECTOMY;  Surgeon: Abigail Miyamoto, MD;  Location: [facility];  Service: General;  Laterality: Left;    Social History: Social History   Socioeconomic History   Marital status: Widowed    Spouse name: Not on file   Number of children: 3   Years of education: Not on file   Highest education level: Not on file  Occupational History   Occupation: [employer]   Occupation: retired  Tobacco Use   Smoking status: Never   Smokeless tobacco: Never  Vaping Use   Vaping status: Never Used  Substance and Sexual Activity   Alcohol use: Never   Drug use: Never   Sexual activity: Not Currently    Birth control/protection: Surgical  Other Topics Concern   Not on file  Social History Narrative   Not on file   Social Determinants of Health   Financial Resource Strain: Low Risk  (07/10/2022)   Overall Financial Resource Strain (CARDIA)    Difficulty of Paying Living Expenses: Not hard at all  Food Insecurity: No Food Insecurity (06/30/2022)   Hunger Vital Sign    Worried About Running Out of Food in the Last Year: Never true    Ran Out of Food in the  Last Year: Never true  Transportation Needs: No Transportation Needs (07/10/2022)   PRAPARE - Administrator, Civil Service (Medical): No    Lack of Transportation (Non-Medical): No  Physical Activity: Insufficiently Active (01/22/2021)   Exercise Vital Sign    Days of Exercise per Week: 3 days    Minutes of Exercise per Session: 30 min  Stress: No Stress Concern Present (01/22/2021)   [employer] of Occupational Health -  Occupational Stress Questionnaire    Feeling of Stress : Not at all  Social Connections: Moderately Integrated (01/22/2021)   Social Connection and Isolation Panel [NHANES]    Frequency of Communication with Friends and Family: More than three times a week    Frequency of Social Gatherings with Friends and Family: More than three times a week    Attends Religious Services: More than 4 times per year    Active Member of [employer] or Organizations: No    Attends Engineer, structural: More than 4 times per year    Marital Status: Widowed  Intimate Partner Violence: Not At Risk (05/28/2022)   Humiliation, Afraid, Rape, and Kick questionnaire    Fear of Current or Ex-Partner: No    Emotionally Abused: No    Physically Abused: No    Sexually Abused: No    Family History: Family History  Problem Relation Age of Onset   Breast cancer Mother        dx in her 36s   Heart disease Mother    Thyroid disease Mother    Lung cancer Father        dx in his 63s   Thyroid disease Maternal Aunt    Thyroid nodules Maternal Grandmother 35       goiter   Thyroid disease Maternal Grandmother    Heart disease Maternal Grandfather    Heart disease Paternal Grandmother    Heart disease Paternal Grandfather    Thyroid disease Daughter    Thyroid disease Daughter    Cancer Maternal Uncle        NOS   Cancer Paternal Uncle        NOS   Breast cancer Cousin        pat first cousin died in her 21s;     Current Medications:  Current Outpatient Medications:     Acetaminophen (TYLENOL PO), Take 1 tablet by mouth as needed., Disp: , Rfl:    gabapentin (NEURONTIN) 300 MG capsule, Take 1 capsule by mouth 2 (two) times daily., Disp: , Rfl:    hydrALAZINE (APRESOLINE) 25 MG tablet, Take 25 mg by mouth 3 (three) times daily., Disp: , Rfl:    levothyroxine (SYNTHROID) 75 MCG tablet, Take 100 mcg by mouth daily before breakfast., Disp: , Rfl:    lidocaine (XYLOCAINE) 2 % solution, Use as directed 15 mLs in the mouth or throat as needed (reflux)., Disp: 100 mL, Rfl: 2   Melatonin 5 MG TABS, Take 10 mg by mouth. As needed for sleep, Disp: , Rfl:    metoCLOPramide (REGLAN) 10 MG tablet, Take 1 tablet (10 mg total) by mouth every 6 (six) hours as needed for nausea or vomiting., Disp: 30 tablet, Rfl: 0   midodrine (PROAMATINE) 10 MG tablet, Take 10 mg by mouth daily., Disp: , Rfl:    olmesartan-hydrochlorothiazide (BENICAR HCT) 40-25 MG tablet, Take 1 tablet by mouth daily., Disp: , Rfl:    oxyCODONE-acetaminophen (PERCOCET/ROXICET) 5-325 MG tablet, Take 1 tablet by mouth every 6 (six) hours as needed for severe pain., Disp: 15 tablet, Rfl: 0   pantoprazole (PROTONIX) 40 MG tablet, Take 40 mg by mouth daily., Disp: , Rfl:    predniSONE (DELTASONE) 50 MG tablet, Take one tab po 13 hours prior, then one tab 7 hours prior, and then one tab 1 hour prior to MRI, Disp: 3 tablet, Rfl: 0   promethazine (PHENERGAN) 25 MG suppository, Place 1 suppository (25 mg total) rectally every 6 (six) hours as  needed for nausea or vomiting., Disp: 12 each, Rfl: 0   sucralfate (CARAFATE) 1 GM/10ML suspension, Take 10 mLs (1 g total) by mouth 4 (four) times daily -  with meals and at bedtime., Disp: 420 mL, Rfl: 0 No current facility-administered medications for this visit.  Facility-Administered Medications Ordered in Other Visits:    0.9 %  sodium chloride infusion, , Intravenous, Continuous, [name], MD, Stopped at 03/29/23 1229   sodium chloride flush (NS) 0.9 %  injection 10 mL, 10 mL, Intravenous, PRN, [name], MD, 10 mL at 03/29/23 1230   Allergies: Allergies  Allergen Reactions   Exforge [Amlodipine Besylate-Valsartan] Nausea And Vomiting   Gadolinium Derivatives Hives, Itching and Anaphylaxis    13hr prep prior to contrast admin  MRI iv contrast   Gadopiclenol Hives and Itching    13 hr prep prior to contrast admin    Tape Other (See Comments)    Redness and Irriatation   Cheese     Hard cheese   Levofloxacin In D5w Hives   Other    Proanthocyanidin    Strawberry Extract Diarrhea    Seeds,nuts, lettuce, grapes   Wild Lettuce Extract (Lactuca Virosa)    Yeast-Related Products Hives    Mold on bread    REVIEW OF SYSTEMS:   Review of Systems  Constitutional:  Negative for chills, fatigue and fever.  HENT:   Negative for lump/mass, mouth sores, nosebleeds, sore throat and trouble swallowing.   Eyes:  Negative for eye problems.  Respiratory:  Positive for shortness of breath. Negative for cough.   Cardiovascular:  Negative for chest pain, leg swelling and palpitations.  Gastrointestinal:  Positive for abdominal pain (burning, 10/10 severity), diarrhea, nausea and vomiting. Negative for constipation.  Genitourinary:  Negative for bladder incontinence, difficulty urinating, dysuria, frequency, hematuria and nocturia.   Musculoskeletal:  Negative for arthralgias, back pain, flank pain, myalgias and neck pain.  Skin:  Negative for itching and rash.  Neurological:  Negative for dizziness, headaches and numbness.  Hematological:  Does not bruise/bleed easily.  Psychiatric/Behavioral:  Negative for depression, sleep disturbance and suicidal ideas. The patient is not nervous/anxious.   All other systems reviewed and are negative.    VITALS:   There were no vitals taken for this visit.  Wt Readings from Last 3 Encounters:  03/29/23 154 lb 3.2 oz (69.9 kg)  03/28/23 166 lb (75.3 kg)  03/15/23 166 lb 4.8 oz (75.4 kg)     There is no height or weight on file to calculate BMI.  Performance status (ECOG): 1 - Symptomatic but completely ambulatory  PHYSICAL EXAM:   Physical Exam Vitals and nursing note reviewed. Exam conducted with a chaperone present.  Constitutional:      Appearance: Normal appearance.  Cardiovascular:     Rate and Rhythm: Normal rate and regular rhythm.     Pulses: Normal pulses.     Heart sounds: Normal heart sounds.  Pulmonary:     Effort: Pulmonary effort is normal.     Breath sounds: Normal breath sounds.  Abdominal:     Palpations: Abdomen is soft. There is no hepatomegaly, splenomegaly or mass.     Tenderness: There is abdominal tenderness in the epigastric area and left upper quadrant.  Musculoskeletal:     Right lower leg: No edema.     Left lower leg: No edema.  Lymphadenopathy:     Cervical: No cervical adenopathy.     Right cervical: No superficial, deep or posterior cervical adenopathy.  Left cervical: No superficial, deep or posterior cervical adenopathy.     Upper Body:     Right upper body: No supraclavicular or axillary adenopathy.     Left upper body: No supraclavicular or axillary adenopathy.  Neurological:     General: No focal deficit present.     Mental Status: She is alert and oriented to person, place, and time.  Psychiatric:        Mood and Affect: Mood normal.        Behavior: Behavior normal.     LABS:      Latest Ref Rng & Units 03/29/2023   10:14 AM 03/28/2023   12:05 PM 03/24/2023   11:50 AM  CBC  WBC 4.0 - 10.5 K/uL 2.2  1.9  2.2   Hemoglobin 12.0 - 15.0 g/dL 29.5  62.1  30.8   Hematocrit 36.0 - 46.0 % 32.5  32.9  33.1   Platelets 150 - 400 K/uL 156  154  116       Latest Ref Rng & Units 03/29/2023   10:14 AM 03/28/2023   12:05 PM 03/24/2023   11:09 AM  CMP  Glucose 70 - 99 mg/dL 82  86  85   BUN 8 - 23 mg/dL 17  17  21    Creatinine 0.44 - 1.00 mg/dL 6.57  8.46  9.62   Sodium 135 - 145 mmol/L 135  135  134   Potassium 3.5 - 5.1  mmol/L 3.6  3.8  3.7   Chloride 98 - 111 mmol/L 105  105  104   CO2 22 - 32 mmol/L 20  20  19    Calcium 8.9 - 10.3 mg/dL 8.7  8.5  8.4   Total Protein 6.5 - 8.1 g/dL 6.7  6.8    Total Bilirubin 0.3 - 1.2 mg/dL 0.8  0.7    Alkaline Phos 38 - 126 U/L 157  158    AST 15 - 41 U/L 28  32    ALT 0 - 44 U/L 28  31       No results found for: "CEA1", "CEA" / No results found for: "CEA1", "CEA" No results found for: "PSA1" No results found for: "XBM841" No results found for: "CAN125"  No results found for: "TOTALPROTELP", "ALBUMINELP", "A1GS", "A2GS", "BETS", "BETA2SER", "GAMS", "MSPIKE", "SPEI" No results found for: "TIBC", "FERRITIN", "IRONPCTSAT" No results found for: "LDH"   STUDIES:   CT ABDOMEN PELVIS W CONTRAST  Result Date: 03/28/2023 CLINICAL DATA:  Abdominal pain. EXAM: CT ABDOMEN AND PELVIS WITH CONTRAST TECHNIQUE: Multidetector CT imaging of the abdomen and pelvis was performed using the standard protocol following bolus administration of intravenous contrast. RADIATION DOSE REDUCTION: This exam was performed according to the departmental dose-optimization program which includes automated exposure control, adjustment of the mA and/or kV according to patient size and/or use of iterative reconstruction technique. CONTRAST:  OMNIPAQUE IOHEXOL 300 MG/ML  SOLN COMPARISON:  Jan 29, 2023 FINDINGS: Lower chest: Subtle area of ground-glass opacities in the right middle lobe measures 2.0 cm. Other subtle areas of airspace consolidation versus atelectasis are seen in a more linear fashion in the right lower lobe. Hepatobiliary: No focal liver abnormality is seen. Status post cholecystectomy. No biliary dilatation. Pancreas: Unremarkable. No pancreatic ductal dilatation or surrounding inflammatory changes. Spleen: Normal in size without focal abnormality. Adrenals/Urinary Tract: Adrenal glands are unremarkable. Kidneys are normal, without renal calculi, focal lesion, or hydronephrosis. Bladder  is unremarkable. Stomach/Bowel: Stomach is within normal limits. There is no  evidence of appendicitis. No evidence of bowel wall thickening, distention, or inflammatory changes. Left sigmoid colon diverticulosis without evidence of acute diverticulitis. Vascular/Lymphatic: No significant vascular findings are present. No enlarged abdominal or pelvic lymph nodes. Reproductive: Status post hysterectomy. No adnexal masses. Other: No abdominal wall hernia or abnormality. No abdominopelvic ascites. Musculoskeletal: Post left mastectomy. Again seen is heterogeneity and expansion of the posterior right sixth rib, T11 sclerotic lesion, and T8 lytic lesion. The skeletal findings are stable. Lytic lesion within the left iliac bone measures 1.6 cm. Right iliac subcentimeter lytic lesion is unchanged. 3.1 cm subcutaneous fluid density mass in the left lateral abdominal wall. IMPRESSION: 1. No acute abnormality identified within the abdomen or pelvis. 2. Subtle area of ground-glass opacities in the right middle lobe measures 2.0 cm. Other subtle areas of airspace consolidation versus atelectasis are seen in a more linear fashion in the right lower lobe. These findings may represent an infectious or inflammatory process. Attention on follow-up is recommended. 3. Left sigmoid colon diverticulosis without evidence of acute diverticulitis. 4. Stable skeletal findings, consistent with metastatic disease. 5. 3.1 cm subcutaneous fluid density mass in the left lateral abdominal wall, benign by appearance. Electronically Signed   By: Ted Mcalpine M.D.   On: 03/28/2023 14:25   DG Chest Port 1 View  Result Date: 03/24/2023 CLINICAL DATA:  Chest pain and shortness of breath.  COVID positive. EXAM: PORTABLE CHEST 1 VIEW COMPARISON:  CT chest dated Jan 29, 2023. Chest x-ray dated January 04, 2023. FINDINGS: Unchanged right chest wall port catheter. Unchanged mild cardiomegaly. Normal pulmonary vascularity. 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 No focal consolidation, pleural effusion, or pneumothorax. No acute osseous abnormality. Unchanged right posterior sixth rib metastasis. IMPRESSION: 1. No active disease. Electronically Signed   By: Obie Dredge M.D.   On: 03/24/2023 11:50

## 2023-03-29 NOTE — Patient Instructions (Signed)
Patient received fluids, zofran for nausea and pepcid for burning in abdomen.  She will return tomorrow and Wednesday for the medications, plus fluids with potassium and magnesium.

## 2023-03-29 NOTE — Progress Notes (Signed)
Patient is here today for fluids per MD orders.  She will get NS IV over 2 hours.  Patient reports for the past 2 weeks she has had terrible upper abdominal pain.  She has been vomiting for the past 24 hours. She went to the ER last night and has not had any relief.  She reports having a headache this morning and took some excedrin migrain and her headache has eased up but she still has 10/10 pain in her abdomen.  Dr. Ellin Saba aware and wants her to have zofran 8 mg iv times one dose now.  Orders also for pepcid 40mg  IV times one dose now as well.  Orders placed.  Dr. Ellin Saba wants patient to come back tomorrow and Wednesday for house fluids, zofran and pepcid again.  Pharmacist aware and orders placed in treatment plan,  signed and held.  Patient tolerated treatment today without incidence. She was discharged via wheelchair because she felt a little shaky and didn't want to risk walking.  She was stable at discharge.  Patient is aware of her appts tomorrow.

## 2023-03-30 ENCOUNTER — Inpatient Hospital Stay: Payer: Medicare Other

## 2023-03-30 ENCOUNTER — Other Ambulatory Visit: Payer: Self-pay | Admitting: *Deleted

## 2023-03-30 VITALS — BP 120/75 | HR 68 | Temp 97.8°F | Resp 18 | Wt 154.6 lb

## 2023-03-30 DIAGNOSIS — R519 Headache, unspecified: Secondary | ICD-10-CM

## 2023-03-30 DIAGNOSIS — D702 Other drug-induced agranulocytosis: Secondary | ICD-10-CM

## 2023-03-30 DIAGNOSIS — C7951 Secondary malignant neoplasm of bone: Secondary | ICD-10-CM | POA: Diagnosis not present

## 2023-03-30 DIAGNOSIS — Z95828 Presence of other vascular implants and grafts: Secondary | ICD-10-CM

## 2023-03-30 DIAGNOSIS — C50912 Malignant neoplasm of unspecified site of left female breast: Secondary | ICD-10-CM | POA: Diagnosis not present

## 2023-03-30 DIAGNOSIS — Z5189 Encounter for other specified aftercare: Secondary | ICD-10-CM | POA: Diagnosis not present

## 2023-03-30 DIAGNOSIS — C778 Secondary and unspecified malignant neoplasm of lymph nodes of multiple regions: Secondary | ICD-10-CM | POA: Diagnosis not present

## 2023-03-30 DIAGNOSIS — R748 Abnormal levels of other serum enzymes: Secondary | ICD-10-CM | POA: Diagnosis not present

## 2023-03-30 DIAGNOSIS — Z5112 Encounter for antineoplastic immunotherapy: Secondary | ICD-10-CM | POA: Diagnosis not present

## 2023-03-30 MED ORDER — KETOROLAC TROMETHAMINE 30 MG/ML IJ SOLN
30.0000 mg | Freq: Once | INTRAMUSCULAR | Status: AC
  Administered 2023-03-30: 30 mg via INTRAVENOUS
  Filled 2023-03-30: qty 1

## 2023-03-30 MED ORDER — SODIUM CHLORIDE 0.9 % IV SOLN
40.0000 mg | Freq: Once | INTRAVENOUS | Status: AC
  Administered 2023-03-30: 40 mg via INTRAVENOUS
  Filled 2023-03-30: qty 4

## 2023-03-30 MED ORDER — POTASSIUM CHLORIDE IN NACL 20-0.9 MEQ/L-% IV SOLN
Freq: Once | INTRAVENOUS | Status: AC
  Filled 2023-03-30: qty 1000

## 2023-03-30 MED ORDER — MAGNESIUM SULFATE 2 GM/50ML IV SOLN
2.0000 g | Freq: Once | INTRAVENOUS | Status: AC
  Administered 2023-03-30: 2 g via INTRAVENOUS
  Filled 2023-03-30: qty 50

## 2023-03-30 MED ORDER — HEPARIN SOD (PORK) LOCK FLUSH 100 UNIT/ML IV SOLN
500.0000 [IU] | Freq: Once | INTRAVENOUS | Status: AC
  Administered 2023-03-30: 500 [IU] via INTRAVENOUS

## 2023-03-30 MED ORDER — ONDANSETRON HCL 4 MG/2ML IJ SOLN
8.0000 mg | Freq: Once | INTRAMUSCULAR | Status: AC
  Administered 2023-03-30: 8 mg via INTRAVENOUS
  Filled 2023-03-30: qty 4

## 2023-03-30 MED ORDER — SUCRALFATE 1 G PO TABS: 1.0000 g | ORAL_TABLET | ORAL | 0 refills | Status: DC | PRN

## 2023-03-30 MED ORDER — SODIUM CHLORIDE 0.9% FLUSH
10.0000 mL | Freq: Once | INTRAVENOUS | Status: AC
  Administered 2023-03-30: 10 mL via INTRAVENOUS

## 2023-03-30 NOTE — Progress Notes (Signed)
Patient returning today for repeat treatment plan made yesterday with Dr Ellin Saba of  potassium chloride in normal saline, 2mg  of magnesium IV, zofran 8mg  IV, and 40mg  of Pepcid IV due to severe abd pain that was undiagnosed after being seen in ER. Patient given started treatment yesterday and does report improvement in abd pain 7/10. Per patient headache in which she took tylenol today at 6am with only minimal relief. Patient requesting something else for headache. Patient also reports inability to get prescribed Carafate filled due to cost. Dr Ellin Saba made aware of patient's request and inability to get medication filled. Order given for 30mg  IV Toradol and patient given referral to GI. April Manson messaged for any possible assistance in regards to patient's Carafate.  New Carafate order placed for tablet, which should be affordable per patient. Patient reported significant improvement in headache with Toradol, 2/10.   Patient tolerated treatment well with no complaints voiced.  Patient left ambulatory in stable condition.  Vital signs stable at discharge.  Follow up as scheduled.

## 2023-03-30 NOTE — Patient Instructions (Signed)
[facility]  Discharge Instructions: Thank you for choosing [facility] to provide your oncology and hematology care.  If you have a lab appointment with the Cancer Center - please note that after April 8th, 2024, all labs will be drawn in the cancer center.  You do not have to check in or register with the main entrance as you have in the past but will complete your check-in in the cancer center.  Wear comfortable clothing and clothing appropriate for easy access to any Portacath or PICC line.   We strive to give you quality time with your provider. You may need to reschedule your appointment if you arrive late (15 or more minutes).  Arriving late affects you and other patients whose appointments are after yours.  Also, if you miss three or more appointments without notifying the office, you may be dismissed from the clinic at the provider's discretion.      For prescription refill requests, have your pharmacy contact our office and allow 72 hours for refills to be completed.    Today you received the following IVF of potassium chloride , zofran, and Pepcid.      To help prevent nausea and vomiting after your treatment, we encourage you to take your nausea medication as directed.  BELOW ARE SYMPTOMS THAT SHOULD BE REPORTED IMMEDIATELY: *FEVER GREATER THAN 100.4 F (38 C) OR HIGHER *CHILLS OR SWEATING *NAUSEA AND VOMITING THAT IS NOT CONTROLLED WITH YOUR NAUSEA MEDICATION *UNUSUAL SHORTNESS OF BREATH *UNUSUAL BRUISING OR BLEEDING *URINARY PROBLEMS (pain or burning when urinating, or frequent urination) *BOWEL PROBLEMS (unusual diarrhea, constipation, pain near the anus) TENDERNESS IN MOUTH AND THROAT WITH OR WITHOUT PRESENCE OF ULCERS (sore throat, sores in mouth, or a toothache) UNUSUAL RASH, SWELLING OR PAIN  UNUSUAL VAGINAL DISCHARGE OR ITCHING   Items with * indicate a potential emergency and should be followed up as soon as possible or go to  the Emergency Department if any problems should occur.  Please show the CHEMOTHERAPY ALERT CARD or IMMUNOTHERAPY ALERT CARD at check-in to the Emergency Department and triage nurse.  Should you have questions after your visit or need to cancel or reschedule your appointment, please contact [facility] [phone]  and follow the prompts.  Office hours are 8:00 a.m. to 4:30 p.m. Monday - Friday. Please note that voicemails left after 4:00 p.m. may not be returned until the following business day.  We are closed weekends and major holidays. You have access to a nurse at all times for urgent questions. Please call the main number to the clinic [phone] and follow the prompts.  For any non-urgent questions, you may also contact your provider using MyChart. We now offer e-Visits for anyone 26 and older to request care online for non-urgent symptoms. For details visit [email or web address].   Also download the MyChart app! Go to the app store, search "MyChart", open the app, select [facility], and log in with your MyChart username and password.

## 2023-03-31 ENCOUNTER — Inpatient Hospital Stay: Payer: Medicare Other

## 2023-03-31 VITALS — BP 112/72 | HR 84 | Temp 99.6°F | Resp 20

## 2023-03-31 DIAGNOSIS — C50912 Malignant neoplasm of unspecified site of left female breast: Secondary | ICD-10-CM

## 2023-03-31 DIAGNOSIS — Z5189 Encounter for other specified aftercare: Secondary | ICD-10-CM | POA: Diagnosis not present

## 2023-03-31 DIAGNOSIS — Z95828 Presence of other vascular implants and grafts: Secondary | ICD-10-CM

## 2023-03-31 DIAGNOSIS — C7951 Secondary malignant neoplasm of bone: Secondary | ICD-10-CM | POA: Diagnosis not present

## 2023-03-31 DIAGNOSIS — Z5112 Encounter for antineoplastic immunotherapy: Secondary | ICD-10-CM | POA: Diagnosis not present

## 2023-03-31 DIAGNOSIS — C778 Secondary and unspecified malignant neoplasm of lymph nodes of multiple regions: Secondary | ICD-10-CM | POA: Diagnosis not present

## 2023-03-31 DIAGNOSIS — R748 Abnormal levels of other serum enzymes: Secondary | ICD-10-CM | POA: Diagnosis not present

## 2023-03-31 MED ORDER — ONDANSETRON HCL 4 MG/2ML IJ SOLN
8.0000 mg | Freq: Once | INTRAMUSCULAR | Status: AC
  Administered 2023-03-31: 8 mg via INTRAVENOUS
  Filled 2023-03-31: qty 4

## 2023-03-31 MED ORDER — FAMOTIDINE IN NACL 20-0.9 MG/50ML-% IV SOLN
20.0000 mg | Freq: Once | INTRAVENOUS | Status: AC
  Administered 2023-03-31: 20 mg via INTRAVENOUS
  Filled 2023-03-31: qty 50

## 2023-03-31 MED ORDER — POTASSIUM CHLORIDE IN NACL 20-0.9 MEQ/L-% IV SOLN
Freq: Once | INTRAVENOUS | Status: AC
  Filled 2023-03-31: qty 1000

## 2023-03-31 MED ORDER — HEPARIN SOD (PORK) LOCK FLUSH 100 UNIT/ML IV SOLN
500.0000 [IU] | Freq: Once | INTRAVENOUS | Status: AC
  Administered 2023-03-31: 500 [IU] via INTRAVENOUS

## 2023-03-31 MED ORDER — SODIUM CHLORIDE 0.9% FLUSH
10.0000 mL | INTRAVENOUS | Status: DC | PRN
  Administered 2023-03-31: 10 mL via INTRAVENOUS

## 2023-03-31 NOTE — Progress Notes (Signed)
Patient presents today for house fluids, Zofran, and Pepcid per provider's order. Vital signs stable and pt voiced no new complaints at this time.  Discharged from clinic ambulatory in stable condition. Alert and oriented x 3. F/U with [facility] as scheduled.

## 2023-04-01 ENCOUNTER — Encounter (INDEPENDENT_AMBULATORY_CARE_PROVIDER_SITE_OTHER): Payer: Self-pay | Admitting: Gastroenterology

## 2023-04-01 ENCOUNTER — Ambulatory Visit (INDEPENDENT_AMBULATORY_CARE_PROVIDER_SITE_OTHER): Payer: Medicare Other | Admitting: Gastroenterology

## 2023-04-01 VITALS — BP 107/70 | HR 81 | Temp 98.0°F | Ht 65.0 in | Wt 153.8 lb

## 2023-04-01 DIAGNOSIS — R1013 Epigastric pain: Secondary | ICD-10-CM

## 2023-04-01 DIAGNOSIS — K2900 Acute gastritis without bleeding: Secondary | ICD-10-CM | POA: Diagnosis not present

## 2023-04-01 DIAGNOSIS — A084 Viral intestinal infection, unspecified: Secondary | ICD-10-CM | POA: Diagnosis not present

## 2023-04-01 DIAGNOSIS — C50912 Malignant neoplasm of unspecified site of left female breast: Secondary | ICD-10-CM

## 2023-04-01 MED ORDER — PANTOPRAZOLE SODIUM 40 MG PO TBEC
40.0000 mg | DELAYED_RELEASE_TABLET | Freq: Every day | ORAL | 3 refills | Status: DC
Start: 2023-04-01 — End: 2023-08-07

## 2023-04-01 NOTE — Progress Notes (Signed)
Denise [name] , M.D. Gastroenterology & Hepatology [facility] Gastroenterology [address] [place], [place] [address] Primary Care Physician: Toma Deiters, MD [address] [place] [address]  Chief Complaint:  Abdominal pain , nausea and vomiting   History of Present Illness: 72 y.o. female with history including hypertension, asthma, hypothyroidism and breast cancer with metastasis to bone and brain , currently undergoing chemotherapy , home test positive for COVID on July 4 is referred to GI clinic with abdominal pain, nausea and vomiting .  Patient reports that 2 weeks ago she had sore throat which was preceded by multiple episodes of nausea vomiting and diarrhea.  She went to the ER 3 times.  Symptomatic treatment was done and a CT was obtained.  Patient today reports that she is going back to her baseline as she is able to eat now, diarrhea has resolved and she had a formed bowel movement this morning.  No further nausea and vomiting.  He feels epigastric discomfort.  Denies taking any NSAIDs.  Prescribed Protonix twice daily but she takes it intermittently .  Last EGD/Colonoscopy 2019  EGD 2019 Normal esophagus. - LA Grade A reflux esophagitis. Single erosion at GEJ. - 2 cm hiatal hernia. - Scar in the gastric antrum. - Erosive gastropathy. Biopsied. - Normal duodenal bulb and second portion of the duodenum. Biopsied.  Colon 2019 One small polyp in the cecum. Biopsied. - One 5 mm polyp in the distal transverse colon, removed with a cold snare. Resected and retrieved. - Diverticulosis in the sigmoid colon. - Internal hemorrhoids.  Next colonoscopy in 5 years. Gastric biopsy shows inflammation but no H. pylori.  Duodenal biopsy is negative for celiac disease.  Social: neg smoking, alcohol or illicit drug use  Past Medical History: Past Medical History:  Diagnosis Date   Asthma    as child   Cancer (HCC) 12/2020   left breast IMC    Complication of anesthesia    pateitn states,' I coded when I had my D&Cmany years ago.   Dyspnea    Family history of breast cancer    Hypertension    Hypothyroidism    PONV (postoperative nausea and vomiting)    Port-A-Cath in place 02/26/2021    Past Surgical History: Past Surgical History:  Procedure Laterality Date   ABDOMINAL HYSTERECTOMY     BIOPSY  01/17/2018   Procedure: BIOPSY;  Surgeon: Malissa Hippo, MD;  [facility];  Service: Endoscopy;;  duodenum,gastric   CHOLECYSTECTOMY     COLONOSCOPY WITH PROPOFOL N/A 01/17/2018   Procedure: COLONOSCOPY WITH PROPOFOL;  Surgeon: Malissa Hippo, MD;  [facility];  Service: Endoscopy;  Laterality: N/A;  7:30   DILATION AND CURETTAGE OF UTERUS     ESOPHAGOGASTRODUODENOSCOPY (EGD) WITH PROPOFOL N/A 01/17/2018   Procedure: ESOPHAGOGASTRODUODENOSCOPY (EGD) WITH PROPOFOL;  Surgeon: Malissa Hippo, MD;  [facility];  Service: Endoscopy;  Laterality: N/A;   POLYPECTOMY  01/17/2018   Procedure: POLYPECTOMY;  Surgeon: Malissa Hippo, MD;  [facility];  Service: Endoscopy;;  transverse colon, cecal   PORTACATH PLACEMENT Right 02/17/2021   Procedure: INSERTION PORT-A-CATH;  Surgeon: Abigail Miyamoto, MD;  Location: [facility];  Service: General;  Laterality: Right;   RADIOACTIVE SEED GUIDED AXILLARY SENTINEL LYMPH NODE Left 11/20/2021   Procedure: RADIOACTIVE SEED GUIDED LEFT AXILLARY SENTINEL LYMPH NODE DISSECTION;  Surgeon: Abigail Miyamoto, MD;  Location: [facility];  Service:  General;  Laterality: Left;   TOTAL MASTECTOMY Left 11/20/2021   Procedure: LEFT TOTAL MASTECTOMY;  Surgeon: Abigail Miyamoto, MD;  Location: [facility];  Service: General;  Laterality: Left;    Family History: Family History  Problem Relation Age of Onset   Breast cancer Mother        dx in her 43s   Heart disease Mother    Thyroid disease Mother    Lung cancer Father         dx in his 82s   Thyroid disease Maternal Aunt    Thyroid nodules Maternal Grandmother 35       goiter   Thyroid disease Maternal Grandmother    Heart disease Maternal Grandfather    Heart disease Paternal Grandmother    Heart disease Paternal Grandfather    Thyroid disease Daughter    Thyroid disease Daughter    Cancer Maternal Uncle        NOS   Cancer Paternal Uncle        NOS   Breast cancer Cousin        pat first cousin died in her 63s;     Social History: Social History   Tobacco Use  Smoking Status Never  Smokeless Tobacco Never   Social History   Substance and Sexual Activity  Alcohol Use Never   Social History   Substance and Sexual Activity  Drug Use Never    Allergies: Allergies  Allergen Reactions   Exforge [Amlodipine Besylate-Valsartan] Nausea And Vomiting   Gadolinium Derivatives Hives, Itching and Anaphylaxis    13hr prep prior to contrast admin  MRI iv contrast   Gadopiclenol Hives and Itching    13 hr prep prior to contrast admin    Tape Other (See Comments)    Redness and Irriatation   Cheese     Hard cheese   Levofloxacin In D5w Hives   Other    Proanthocyanidin    Strawberry Extract Diarrhea    Seeds,nuts, lettuce, grapes   Wild Lettuce Extract (Lactuca Virosa)    Yeast-Related Products Hives    Mold on bread    Medications: Current Outpatient Medications  Medication Sig Dispense Refill   Acetaminophen (TYLENOL PO) Take 1 tablet by mouth as needed.     levothyroxine (SYNTHROID) 75 MCG tablet Take 75 mcg by mouth daily before breakfast.     lidocaine (XYLOCAINE) 2 % solution Use as directed 15 mLs in the mouth or throat as needed (reflux). 100 mL 2   olmesartan-hydrochlorothiazide (BENICAR HCT) 40-25 MG tablet Take 1 tablet by mouth daily.     pantoprazole (PROTONIX) 40 MG tablet Take 40 mg by mouth daily.     pantoprazole (PROTONIX) 40 MG tablet Take 1 tablet (40 mg total) by mouth daily. 60 tablet 3   promethazine  (PHENERGAN) 25 MG suppository Place 1 suppository (25 mg total) rectally every 6 (six) hours as needed for nausea or vomiting. 12 each 0   sucralfate (CARAFATE) 1 g tablet Take 1 tablet (1 g total) by mouth every 4 (four) hours as needed. 180 tablet 0   gabapentin (NEURONTIN) 300 MG capsule Take 1 capsule by mouth 2 (two) times daily. (Patient not taking: Reported on 04/01/2023)     hydrALAZINE (APRESOLINE) 25 MG tablet Take 25 mg by mouth 3 (three) times daily. (Patient not taking: Reported on 04/01/2023)     Melatonin 5 MG TABS Take 10 mg by mouth. As needed for sleep (Patient not taking: Reported on  04/01/2023)     metoCLOPramide (REGLAN) 10 MG tablet Take 1 tablet (10 mg total) by mouth every 6 (six) hours as needed for nausea or vomiting. (Patient not taking: Reported on 04/01/2023) 30 tablet 0   midodrine (PROAMATINE) 10 MG tablet Take 10 mg by mouth daily. (Patient not taking: Reported on 04/01/2023)     No current facility-administered medications for this visit.    Review of Systems: GENERAL: negative for malaise, night sweats HEENT: No changes in hearing or vision, no nose bleeds or other nasal problems. NECK: Negative for lumps, goiter, pain and significant neck swelling RESPIRATORY: Negative for cough, wheezing CARDIOVASCULAR: Negative for chest pain, leg swelling, palpitations, orthopnea GI: SEE HPI MUSCULOSKELETAL: Negative for joint pain or swelling, back pain, and muscle pain. SKIN: Negative for lesions, rash HEMATOLOGY Negative for prolonged bleeding, bruising easily, and swollen nodes. ENDOCRINE: Negative for cold or heat intolerance, polyuria, polydipsia and goiter. NEURO: negative for tremor, gait imbalance, syncope and seizures. The remainder of the review of systems is noncontributory.   Physical Exam: BP 107/70 (BP Location: Right Arm, Patient Position: Sitting, Cuff Size: Normal)   Pulse 81   Temp 98 F (36.7 C) (Temporal)   Ht 5\' 5"  (1.651 m)   Wt 153 lb 12.8 oz  (69.8 kg)   BMI 25.59 kg/m  GENERAL: The patient is AO x3, in no acute distress. HEENT: Head is normocephalic and atraumatic. EOMI are intact. Mouth is well hydrated and without lesions. NECK: Supple. No masses LUNGS: Clear to auscultation. No presence of rhonchi/wheezing/rales. Adequate chest expansion HEART: RRR, normal s1 and s2. ABDOMEN: Soft, nontender, no guarding, no peritoneal signs, and nondistended. BS +. No masses. EXTREMITIES: Without any cyanosis, clubbing, rash, lesions or edema. NEUROLOGIC: AOx3, no focal motor deficit. SKIN: no jaundice, no rashes   Imaging/Labs: as above  I personally reviewed and interpreted the available labs, imaging and endoscopic files.  Ct Abdomen 03/2023 IMPRESSION: 1. No acute abnormality identified within the abdomen or pelvis. 2. Subtle area of ground-glass opacities in the right middle lobe measures 2.0 cm. Other subtle areas of airspace consolidation versus atelectasis are seen in a more linear fashion in the right lower lobe. These findings may represent an infectious or inflammatory process. Attention on follow-up is recommended. 3. Left sigmoid colon diverticulosis without evidence of acute diverticulitis. 4. Stable skeletal findings, consistent with metastatic disease. 5. 3.1 cm subcutaneous fluid density mass in the left lateral abdominal wall, benign by appearance.  Impression and Plan:  72 y.o. female with history including hypertension, asthma, hypothyroidism and breast cancer with metastasis to bone and brain , currently undergoing chemotherapy , home test positive for COVID on July 4 is referred to GI clinic with abdominal pain, nausea and vomiting .  #Abdominal pain , nausea, vomiting and diarrhea  Patient had sore throat 2 weeks ago works as a Runner, broadcasting/film/video, proceeded by nausea vomiting and diarrhea .  This is very typical of viral gastroenteritis. Patient is now almost back to baseline as she is able to tolerate p.o. diet  and having regular bowel movements.  Abdominal exam done today in the office is also benign  Currently on home test was positive for COVID on July 4, which also could have led to patient's symptoms  Upper endoscopy 2019 with biopsies negative for H. pylori and celiac   Nausea and vomiting may have led to erosive gastritis.  Treatment is PPI which patient has not really taking proper, as she is taking it intermittently with food  Recs: Protonix 40 mg 30 minutes before breakfast for at least 8 weeks Avoid NSAIDs If symptoms recur may consider endoscopy evaluation  HCM colonoscopy 2019 suggested repeat due to 5 years Given patient is battling with metastatic cancer to bone and brain, we discussed risk-benefit and alternatives to screening colonoscopy; patient would like to defer colonoscopy at this time  All questions were answered.      Denise Lawman, MD Gastroenterology and Hepatology [facility] Gastroenterology

## 2023-04-01 NOTE — Patient Instructions (Signed)
It was very nice to meet you today, as dicussed with will plan for the following : 1) Take Protonix 40 30 mg before breakfast 2) Avoid NSAID : ibuprofen, naproxen , Advil, Alleve

## 2023-04-05 ENCOUNTER — Inpatient Hospital Stay: Payer: Medicare Other | Admitting: Hematology

## 2023-04-05 ENCOUNTER — Inpatient Hospital Stay: Payer: Medicare Other

## 2023-04-05 ENCOUNTER — Inpatient Hospital Stay (HOSPITAL_BASED_OUTPATIENT_CLINIC_OR_DEPARTMENT_OTHER): Payer: Medicare Other

## 2023-04-05 ENCOUNTER — Other Ambulatory Visit: Payer: Self-pay

## 2023-04-05 DIAGNOSIS — Z95828 Presence of other vascular implants and grafts: Secondary | ICD-10-CM

## 2023-04-05 DIAGNOSIS — C50912 Malignant neoplasm of unspecified site of left female breast: Secondary | ICD-10-CM

## 2023-04-05 MED ORDER — SODIUM CHLORIDE 0.9% FLUSH: 10.0000 mL | INTRAVENOUS | Status: DC | PRN

## 2023-04-05 MED ORDER — HEPARIN SOD (PORK) LOCK FLUSH 100 UNIT/ML IV SOLN: 500.0000 [IU] | Freq: Once | INTRAVENOUS | Status: DC

## 2023-04-05 NOTE — Progress Notes (Signed)
Patient requested to hold Zarxio and treatment this week due to recent illness.  She is still having weakness and constipation.  Dr. Ellin Saba notified and agreed to holding treatment until next week.  No labs needed today per MD.  Scheduling aware.  Patient left ambulatory in stable condition.

## 2023-04-05 NOTE — Progress Notes (Incomplete)
[facility] [address], [place] [address]    Clinic Day:  04/06/2023  Referring physician: Toma Deiters, [name]  Patient Care Team: Denise Deiters, [name] as PCP - General (Internal Medicine) Denise [name] as Medical Oncologist (Medical Oncology)   ASSESSMENT & PLAN:   Assessment: 1.  T3N3c (stage IIIc) triple negative invasive lobular carcinoma of the left breast: - She felt lump in her left breast for more than 6 months, but thought it was secondary to fibrocystic disease which she had all her life.  When she started having pain, she reached out to Denise [name]. - Ultrasound-guided left breast and left axillary lymph node biopsy on 01/15/2021 - Pathology consistent with invasive lobular carcinoma, E-cadherin negative.  ER/PR/HER2 negative.  HER2 2+ by IHC, negative by FISH.  Ki-67 is 5%.  Lymph node core biopsy was consistent with metastatic carcinoma.  Grade 2. - PET scan on 02/03/2021 showed involvement of left supraclavicular, subpectoral, axillary lymph nodes along with the breast mass.  10 mm left lung nodule which is hypometabolic.  Spinal cord lesion at T12 level with a strong uptake. - MRI of the lumbar spine with and without contrast on 02/20/2021 showed no mass or abnormal enhancement within the canal at the T12 level to correspond to the site of PET scan positive.  No marrow replacing bone lesion. - 12 weeks of carboplatin and paclitaxel weekly and every 3 weeks pembrolizumab followed by 4 cycles of dose dense AC with pembrolizumab ([id number]) from 02/27/2021 through 10/01/2021 - PET scan on 10/16/2021: Reduction in metabolic activity in the size of the left axillary lymph node and no evidence of breast hypermetabolism on PET scan. - Left mastectomy and lymph node excision by Denise [name] on 11/20/2021 - Pathology: 6.2 cm invasive lobular carcinoma, grade 2, margins negative.  19/21 lymph nodes involved with macrometastasis.  ypT3, YPN3A - Adjuvant  pembrolizumab started on 01/01/2022. -CREATE-X capecitabine 1500 mg twice daily 2 weeks on/1 week off started on 01/01/2022.  Last dose of pembrolizumab on 05/11/2022 - Germline mutation testing ([facility]) negative - [employer] (06/04/2022): T p53, K-ras V14 I, CDKN2A.  MSI-high not detected. - PET scan (05/28/2022): Multifocal bone lesions involving T11 vertebral body, right posterior sixth rib, left femoral neck, left scapular glenoid.  No other metastatic disease. - Sacituzumab cycle 1 on 08/18/2022.  She was hospitalized with neutropenic fever and severe diarrhea from 08/24/2022 through 08/27/2022. - CTAP (10/25/2021): Generalized mild to moderate wall thickening throughout the nondistended large bowel with faint pericolonic fat haziness compatible with nonspecific infectious/inflammatory colitis.  No free air.  C. difficile x 2 was negative. - She has UG T1 A1*28 heterozygosity. - Cycle 2 Sacituzumab dose reduced by 50%.  Discontinued secondary to poor tolerance. - Enhertu 3.2 Mg/KG cycle 1 on 11/04/2022   2.  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  She is non-smoker. - Mother died of breast cancer.  Maternal grandmother died very young in her 80s, sister has fibrocystic disease.  Father died of lung cancer and was a smoker.    Plan: 1.  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 - Developed sore throat on 03/18/2023, tested positive for COVID at home. - She went to the ER on 03/20/2023 at [facility] with nausea vomiting and diarrhea.  Given some IV fluids. - She went to the ER at [facility] on 03/24/2023.  She had complained of abdominal pain, nausea vomiting and diarrhea. - She presented to the ER again on 03/28/2023: CT [facility] left sigmoid diverticulosis without evidence of diverticulitis. - She reports abdominal pain, mostly in the epigastric region and burning type.  Also has some nausea and vomiting but  denied any diarrhea.  GI cocktail given in the ER helped for 2 to 3 hours.  She was also given lidocaine with Maalox which helps for 30 minutes. - Yesterday she was given Carafate and Phenergan suppository which she has not filled yet. - Reviewed labs today: Normal LFTs except elevated alk phos.  Creatinine normal.  CBC shows white count is low at 2.2 and ANC of 1.1.  Platelet count was normal.  Hemoglobin is close to 11. - Will give her Pepcid 40 mg IV and Phenergan suppository.  She was also given IV Zofran and IV fluids with electrolytes today. - I will arrange her for fluids with electrolytes, Pepcid and Zofran daily tomorrow and Wednesday.  2.  Brain lesions (SRS to 3 brain lesions on 06/16/2022): - Repeat MRI scheduled on 04/19/2023.  3.  Peripheral neuropathy: - Continue gabapentin 300 mg at bedtime.  4.  Hypomagnesemia: - Continue magnesium supplements.  Magnesium is normal today.    No orders of the defined types were placed in this encounter.     Denise [name],acting as a Neurosurgeon for Denise [name].,have documented all relevant documentation on the behalf of Denise [name],as directed by  Denise [name].  ***    Denise [name]   7/22/202412:03 PM  CHIEF COMPLAINT:   Diagnosis: Metastatic triple negative breast cancer to the bones    Cancer Staging  Invasive lobular carcinoma of left breast in female [facility]) Staging form: Breast, AJCC 8th Edition - Clinical stage from 01/23/2021: cT3, cN3c, G2, ER-, PR-, HER2- - Unsigned - Pathologic stage from 12/22/2021: No Stage Recommended (ypT3, pN3a, cM0, G2, ER-, PR-, HER2-) - Signed by Denise [name] on 12/22/2021    Prior Therapy: 1.  Neoadjuvant chemotherapy with weekly carboplatin and paclitaxel and dose dense AC with pembrolizumab 2. Left mastectomy and lymph node excision by Denise [name] on 11/20/2021 3.  Sacituzumab on 08/18/2022 and  10/08/2021, discontinued due to poor tolerance.  Current Therapy:  Enhertu every 21 days    HISTORY OF PRESENT ILLNESS:   Oncology History  Invasive lobular carcinoma of left breast in female [facility])  01/23/2021 Initial Diagnosis   Invasive lobular carcinoma of left breast in female [facility])   02/19/2021 Genetic Testing   Negative genetic testing on the CancerNext-Expanded+RNAinsight panel.  SMARCB1 VUS identified.  The CancerNext-Expanded gene panel offered by [facility] and includes sequencing and rearrangement analysis for the following 77 genes: AIP, ALK, APC*, ATM*, AXIN2, BAP1, BARD1, BLM, BMPR1A, BRCA1*, BRCA2*, BRIP1*, CDC73, CDH1*, CDK4, CDKN1B, CDKN2A, CHEK2*, CTNNA1, DICER1, FANCC, FH, FLCN, GALNT12, KIF1B, LZTR1, MAX, MEN1, MET, MLH1*, MSH2*, MSH3, MSH6*, MUTYH*, NBN, NF1*, NF2, NTHL1, PALB2*, PHOX2B, PMS2*, POT1, PRKAR1A, PTCH1, PTEN*, RAD51C*, RAD51D*, RB1, RECQL, RET, SDHA, SDHAF2, SDHB, SDHC, SDHD, SMAD4, SMARCA4, SMARCB1, SMARCE1, STK11, SUFU, TMEM127, TP53*, TSC1, TSC2, VHL and XRCC2 (sequencing and deletion/duplication); EGFR, EGLN1, HOXB13, KIT, MITF, PDGFRA, POLD1, and POLE (sequencing only); EPCAM and GREM1 (deletion/duplication only). DNA and RNA analyses performed for * genes. The report date is February 19, 2021.   02/27/2021 - 05/11/2022 Chemotherapy   Patient is on Treatment Plan : BREAST Pembrolizumab + Carboplatin D1,8,15+ Paclitaxel D1,8,15  q21d X 4 cycles / Pembrolizumab + AC q21d x 4 cycles     12/22/2021 Cancer Staging   Staging form: Breast, AJCC 8th Edition - Pathologic stage from 12/22/2021: No Stage Recommended (ypT3, pN3a, cM0, G2, ER-, PR-, HER2-) - Signed by Denise [name] on 12/22/2021 Histopathologic type: Lobular carcinoma, NOS Stage prefix: Post-therapy Method of lymph node assessment: Axillary lymph node dissection Multigene prognostic tests performed: None Histologic grading system: 3 grade system   08/18/2022 - 10/08/2022 Chemotherapy   Patient is  on Treatment Plan : BREAST METASTATIC Sacituzumab govitecan-hziy Drinda Butts) D1,8 q21d     11/04/2022 -  Chemotherapy   Patient is on Treatment Plan : BREAST METASTATIC Fam-Trastuzumab Deruxtecan-nxki (Enhertu) (5.4) q21d        INTERVAL HISTORY:   Denise [name] is a 72 y.o. female presenting to clinic today for follow up of Metastatic triple negative breast cancer to the bones. She was last seen by me on 03/29/23.  Today, she states that she is doing well overall. Her appetite level is at ***%. Her energy level is at ***%.   PAST MEDICAL HISTORY:   Past Medical History: Past Medical History:  Diagnosis Date   Asthma    as child   Cancer (HCC) 12/2020   left breast IMC   Complication of anesthesia    pateitn states,' I coded when I had my D&Cmany years ago.   Dyspnea    Family history of breast cancer    Hypertension    Hypothyroidism    PONV (postoperative nausea and vomiting)    Port-A-Cath in place 02/26/2021    Surgical History: Past Surgical History:  Procedure Laterality Date   ABDOMINAL HYSTERECTOMY     BIOPSY  01/17/2018   Procedure: BIOPSY;  Surgeon: Malissa Hippo, [name];  [facility];  Service: Endoscopy;;  duodenum,gastric   CHOLECYSTECTOMY     COLONOSCOPY WITH PROPOFOL N/A 01/17/2018   Procedure: COLONOSCOPY WITH PROPOFOL;  Surgeon: Malissa Hippo, [name];  [facility];  Service: Endoscopy;  Laterality: N/A;  7:30   DILATION AND CURETTAGE OF UTERUS     ESOPHAGOGASTRODUODENOSCOPY (EGD) WITH PROPOFOL N/A 01/17/2018   Procedure: ESOPHAGOGASTRODUODENOSCOPY (EGD) WITH PROPOFOL;  Surgeon: Malissa Hippo, [name];  [facility];  Service: Endoscopy;  Laterality: N/A;   POLYPECTOMY  01/17/2018   Procedure: POLYPECTOMY;  Surgeon: Malissa Hippo, [name];  [facility];  Service: Endoscopy;;  transverse colon, cecal   PORTACATH PLACEMENT Right 02/17/2021   Procedure: INSERTION PORT-A-CATH;  Surgeon: Abigail Miyamoto, [name];  Location: [facility];  Service: General;  Laterality: Right;   RADIOACTIVE SEED GUIDED AXILLARY SENTINEL LYMPH NODE Left 11/20/2021   Procedure: RADIOACTIVE SEED GUIDED LEFT AXILLARY SENTINEL LYMPH NODE DISSECTION;  Surgeon: Abigail Miyamoto, [name];  Location: [facility];  Service: General;  Laterality: Left;   TOTAL MASTECTOMY Left 11/20/2021   Procedure: LEFT TOTAL MASTECTOMY;  Surgeon: Abigail Miyamoto, [name];  Location:  SURGERY CENTER;  Service: General;  Laterality: Left;    Social History: Social History   Socioeconomic History   Marital status: Widowed    Spouse name: Not on file   Number of children: 3   Years of education: Not on file   Highest education level: Not on file  Occupational History   Occupation: [employer]   Occupation: retired  Tobacco Use   Smoking status: Never   Smokeless tobacco: Never  Vaping Use   Vaping status: Never  Used  Substance and Sexual Activity   Alcohol use: Never   Drug use: Never   Sexual activity: Not Currently    Birth control/protection: Surgical  Other Topics Concern   Not on file  Social History Narrative   Not on file   Social Determinants of Health   Financial Resource Strain: Low Risk  (07/10/2022)   Overall Financial Resource Strain (CARDIA)    Difficulty of Paying Living Expenses: Not hard at all  Food Insecurity: No Food Insecurity (06/30/2022)   Hunger Vital Sign    Worried About Running Out of Food in the Last Year: Never true    Ran Out of Food in the Last Year: Never true  Transportation Needs: No Transportation Needs (07/10/2022)   PRAPARE - Administrator, Civil Service (Medical): No    Lack of Transportation (Non-Medical): No  Physical Activity: Insufficiently Active (01/22/2021)   Exercise Vital Sign    Days of Exercise per Week: 3 days    Minutes of Exercise per Session: 30 min  Stress: No Stress Concern Present (01/22/2021)   [employer] of Occupational Health  - Occupational Stress Questionnaire    Feeling of Stress : Not at all  Social Connections: Moderately Integrated (01/22/2021)   Social Connection and Isolation Panel [NHANES]    Frequency of Communication with Friends and Family: More than three times a week    Frequency of Social Gatherings with Friends and Family: More than three times a week    Attends Religious Services: More than 4 times per year    Active Member of [employer] or Organizations: No    Attends Engineer, structural: More than 4 times per year    Marital Status: Widowed  Intimate Partner Violence: Not At Risk (05/28/2022)   Humiliation, Afraid, Rape, and Kick questionnaire    Fear of Current or Ex-Partner: No    Emotionally Abused: No    Physically Abused: No    Sexually Abused: No    Family History: Family History  Problem Relation Age of Onset   Breast cancer Mother        dx in her 44s   Heart disease Mother    Thyroid disease Mother    Lung cancer Father        dx in his 74s   Thyroid disease Maternal Aunt    Thyroid nodules Maternal Grandmother 35       goiter   Thyroid disease Maternal Grandmother    Heart disease Maternal Grandfather    Heart disease Paternal Grandmother    Heart disease Paternal Grandfather    Thyroid disease Daughter    Thyroid disease Daughter    Cancer Maternal Uncle        NOS   Cancer Paternal Uncle        NOS   Breast cancer Cousin        pat first cousin died in her 36s;     Current Medications:  Current Outpatient Medications:    Acetaminophen (TYLENOL PO), Take 1 tablet by mouth as needed., Disp: , Rfl:    gabapentin (NEURONTIN) 300 MG capsule, Take 1 capsule by mouth 2 (two) times daily. (Patient not taking: Reported on 04/01/2023), Disp: , Rfl:    hydrALAZINE (APRESOLINE) 25 MG tablet, Take 25 mg by mouth 3 (three) times daily. (Patient not taking: Reported on 04/01/2023), Disp: , Rfl:    levothyroxine (SYNTHROID) 75 MCG tablet, Take 75 mcg by mouth daily before  breakfast., Disp: ,  Rfl:    lidocaine (XYLOCAINE) 2 % solution, Use as directed 15 mLs in the mouth or throat as needed (reflux)., Disp: 100 mL, Rfl: 2   Melatonin 5 MG TABS, Take 10 mg by mouth. As needed for sleep (Patient not taking: Reported on 04/01/2023), Disp: , Rfl:    metoCLOPramide (REGLAN) 10 MG tablet, Take 1 tablet (10 mg total) by mouth every 6 (six) hours as needed for nausea or vomiting. (Patient not taking: Reported on 04/01/2023), Disp: 30 tablet, Rfl: 0   midodrine (PROAMATINE) 10 MG tablet, Take 10 mg by mouth daily. (Patient not taking: Reported on 04/01/2023), Disp: , Rfl:    olmesartan-hydrochlorothiazide (BENICAR HCT) 40-25 MG tablet, Take 1 tablet by mouth daily., Disp: , Rfl:    pantoprazole (PROTONIX) 40 MG tablet, Take 40 mg by mouth daily., Disp: , Rfl:    pantoprazole (PROTONIX) 40 MG tablet, Take 1 tablet (40 mg total) by mouth daily., Disp: 60 tablet, Rfl: 3   promethazine (PHENERGAN) 25 MG suppository, Place 1 suppository (25 mg total) rectally every 6 (six) hours as needed for nausea or vomiting., Disp: 12 each, Rfl: 0   sucralfate (CARAFATE) 1 g tablet, Take 1 tablet (1 g total) by mouth every 4 (four) hours as needed., Disp: 180 tablet, Rfl: 0   Allergies: Allergies  Allergen Reactions   Exforge [Amlodipine Besylate-Valsartan] Nausea And Vomiting   Gadolinium Derivatives Hives, Itching and Anaphylaxis    13hr prep prior to contrast admin  MRI iv contrast   Gadopiclenol Hives and Itching    13 hr prep prior to contrast admin    Tape Other (See Comments)    Redness and Irriatation   Cheese     Hard cheese   Levofloxacin In D5w Hives   Other    Proanthocyanidin    Strawberry Extract Diarrhea    Seeds,nuts, lettuce, grapes   Wild Lettuce Extract (Lactuca Virosa)    Yeast-Related Products Hives    Mold on bread    REVIEW OF SYSTEMS:   Review of Systems  Constitutional:  Negative for chills, fatigue and fever.  HENT:   Negative for lump/mass, mouth  sores, nosebleeds, sore throat and trouble swallowing.   Eyes:  Negative for eye problems.  Respiratory:  Negative for cough and shortness of breath.   Cardiovascular:  Negative for chest pain, leg swelling and palpitations.  Gastrointestinal:  Negative for abdominal pain, constipation, diarrhea, nausea and vomiting.  Genitourinary:  Negative for bladder incontinence, difficulty urinating, dysuria, frequency, hematuria and nocturia.   Musculoskeletal:  Negative for arthralgias, back pain, flank pain, myalgias and neck pain.  Skin:  Negative for itching and rash.  Neurological:  Negative for dizziness, headaches and numbness.  Hematological:  Does not bruise/bleed easily.  Psychiatric/Behavioral:  Negative for depression, sleep disturbance and suicidal ideas. The patient is not nervous/anxious.   All other systems reviewed and are negative.    VITALS:   There were no vitals taken for this visit.  Wt Readings from Last 3 Encounters:  04/01/23 153 lb 12.8 oz (69.8 kg)  03/30/23 154 lb 9.6 oz (70.1 kg)  03/29/23 154 lb 3.2 oz (69.9 kg)    There is no height or weight on file to calculate BMI.  Performance status (ECOG): 1 - Symptomatic but completely ambulatory  PHYSICAL EXAM:   Physical Exam Vitals and nursing note reviewed. Exam conducted with a chaperone present.  Constitutional:      Appearance: Normal appearance.  Cardiovascular:     Rate  and Rhythm: Normal rate and regular rhythm.     Pulses: Normal pulses.     Heart sounds: Normal heart sounds.  Pulmonary:     Effort: Pulmonary effort is normal.     Breath sounds: Normal breath sounds.  Abdominal:     Palpations: Abdomen is soft. There is no hepatomegaly, splenomegaly or mass.     Tenderness: There is no abdominal tenderness.  Musculoskeletal:     Right lower leg: No edema.     Left lower leg: No edema.  Lymphadenopathy:     Cervical: No cervical adenopathy.     Right cervical: No superficial, deep or posterior  cervical adenopathy.    Left cervical: No superficial, deep or posterior cervical adenopathy.     Upper Body:     Right upper body: No supraclavicular or axillary adenopathy.     Left upper body: No supraclavicular or axillary adenopathy.  Neurological:     General: No focal deficit present.     Mental Status: She is alert and oriented to person, place, and time.  Psychiatric:        Mood and Affect: Mood normal.        Behavior: Behavior normal.     LABS:      Latest Ref Rng & Units 03/29/2023   10:14 AM 03/28/2023   12:05 PM 03/24/2023   11:50 AM  CBC  WBC 4.0 - 10.5 K/uL 2.2  1.9  2.2   Hemoglobin 12.0 - 15.0 g/dL 78.2  95.6  21.3   Hematocrit 36.0 - 46.0 % 32.5  32.9  33.1   Platelets 150 - 400 K/uL 156  154  116       Latest Ref Rng & Units 03/29/2023   10:14 AM 03/28/2023   12:05 PM 03/24/2023   11:09 AM  CMP  Glucose 70 - 99 mg/dL 82  86  85   BUN 8 - 23 mg/dL 17  17  21    Creatinine 0.44 - 1.00 mg/dL 0.86  5.78  4.69   Sodium 135 - 145 mmol/L 135  135  134   Potassium 3.5 - 5.1 mmol/L 3.6  3.8  3.7   Chloride 98 - 111 mmol/L 105  105  104   CO2 22 - 32 mmol/L 20  20  19    Calcium 8.9 - 10.3 mg/dL 8.7  8.5  8.4   Total Protein 6.5 - 8.1 g/dL 6.7  6.8    Total Bilirubin 0.3 - 1.2 mg/dL 0.8  0.7    Alkaline Phos 38 - 126 U/L 157  158    AST 15 - 41 U/L 28  32    ALT 0 - 44 U/L 28  31       No results found for: "CEA1", "CEA" / No results found for: "CEA1", "CEA" No results found for: "PSA1" No results found for: "GEX528" No results found for: "CAN125"  No results found for: "TOTALPROTELP", "ALBUMINELP", "A1GS", "A2GS", "BETS", "BETA2SER", "GAMS", "MSPIKE", "SPEI" No results found for: "TIBC", "FERRITIN", "IRONPCTSAT" No results found for: "LDH"   STUDIES:   CT ABDOMEN PELVIS W CONTRAST  Result Date: 03/28/2023 CLINICAL DATA:  Abdominal pain. EXAM: CT ABDOMEN AND PELVIS WITH CONTRAST TECHNIQUE: Multidetector CT imaging of the abdomen and pelvis was  performed using the standard protocol following bolus administration of intravenous contrast. RADIATION DOSE REDUCTION: This exam was performed according to the departmental dose-optimization program which includes automated exposure control, adjustment of the mA and/or kV according to  patient size and/or use of iterative reconstruction technique. CONTRAST:  OMNIPAQUE IOHEXOL 300 MG/ML  SOLN COMPARISON:  Jan 29, 2023 FINDINGS: Lower chest: Subtle area of ground-glass opacities in the right middle lobe measures 2.0 cm. Other subtle areas of airspace consolidation versus atelectasis are seen in a more linear fashion in the right lower lobe. Hepatobiliary: No focal liver abnormality is seen. Status post cholecystectomy. No biliary dilatation. Pancreas: Unremarkable. No pancreatic ductal dilatation or surrounding inflammatory changes. Spleen: Normal in size without focal abnormality. Adrenals/Urinary Tract: Adrenal glands are unremarkable. Kidneys are normal, without renal calculi, focal lesion, or hydronephrosis. Bladder is unremarkable. Stomach/Bowel: Stomach is within normal limits. There is no evidence of appendicitis. No evidence of bowel wall thickening, distention, or inflammatory changes. Left sigmoid colon diverticulosis without evidence of acute diverticulitis. Vascular/Lymphatic: No significant vascular findings are present. No enlarged abdominal or pelvic lymph nodes. Reproductive: Status post hysterectomy. No adnexal masses. Other: No abdominal wall hernia or abnormality. No abdominopelvic ascites. Musculoskeletal: Post left mastectomy. Again seen is heterogeneity and expansion of the posterior right sixth rib, T11 sclerotic lesion, and T8 lytic lesion. The skeletal findings are stable. Lytic lesion within the left iliac bone measures 1.6 cm. Right iliac subcentimeter lytic lesion is unchanged. 3.1 cm subcutaneous fluid density mass in the left lateral abdominal wall. IMPRESSION: 1. No acute  abnormality identified within the abdomen or pelvis. 2. Subtle area of ground-glass opacities in the right middle lobe measures 2.0 cm. Other subtle areas of airspace consolidation versus atelectasis are seen in a more linear fashion in the right lower lobe. These findings may represent an infectious or inflammatory process. Attention on follow-up is recommended. 3. Left sigmoid colon diverticulosis without evidence of acute diverticulitis. 4. Stable skeletal findings, consistent with metastatic disease. 5. 3.1 cm subcutaneous fluid density mass in the left lateral abdominal wall, benign by appearance. Electronically Signed   By: Ted Mcalpine M.D.   On: 03/28/2023 14:25   DG Chest Port 1 View  Result Date: 03/24/2023 CLINICAL DATA:  Chest pain and shortness of breath.  COVID positive. EXAM: PORTABLE CHEST 1 VIEW COMPARISON:  CT chest dated Jan 29, 2023. Chest x-ray dated January 04, 2023. FINDINGS: Unchanged right chest wall port catheter. Unchanged mild cardiomegaly. Normal pulmonary vascularity. Unchanged small calcified granuloma in the left upper lobe and 1.1 cm hamartoma in the left lower lobe. No focal consolidation, pleural effusion, or pneumothorax. No acute osseous abnormality. Unchanged right posterior sixth rib metastasis. IMPRESSION: 1. No active disease. Electronically Signed   By: Obie Dredge M.D.   On: 03/24/2023 11:50

## 2023-04-06 ENCOUNTER — Inpatient Hospital Stay: Payer: Medicare Other

## 2023-04-06 ENCOUNTER — Inpatient Hospital Stay: Payer: Medicare Other | Admitting: Hematology

## 2023-04-07 ENCOUNTER — Inpatient Hospital Stay: Payer: Medicare Other

## 2023-04-07 ENCOUNTER — Other Ambulatory Visit: Payer: Self-pay

## 2023-04-08 ENCOUNTER — Other Ambulatory Visit: Payer: Self-pay

## 2023-04-08 ENCOUNTER — Encounter: Payer: Self-pay | Admitting: Hematology

## 2023-04-09 ENCOUNTER — Other Ambulatory Visit: Payer: Self-pay | Admitting: Radiation Therapy

## 2023-04-09 DIAGNOSIS — C7931 Secondary malignant neoplasm of brain: Secondary | ICD-10-CM

## 2023-04-12 ENCOUNTER — Other Ambulatory Visit: Payer: Self-pay | Admitting: *Deleted

## 2023-04-12 ENCOUNTER — Inpatient Hospital Stay: Payer: Medicare Other

## 2023-04-12 DIAGNOSIS — C778 Secondary and unspecified malignant neoplasm of lymph nodes of multiple regions: Secondary | ICD-10-CM | POA: Diagnosis not present

## 2023-04-12 DIAGNOSIS — C50912 Malignant neoplasm of unspecified site of left female breast: Secondary | ICD-10-CM

## 2023-04-12 DIAGNOSIS — Z5189 Encounter for other specified aftercare: Secondary | ICD-10-CM | POA: Diagnosis not present

## 2023-04-12 DIAGNOSIS — Z95828 Presence of other vascular implants and grafts: Secondary | ICD-10-CM

## 2023-04-12 DIAGNOSIS — R748 Abnormal levels of other serum enzymes: Secondary | ICD-10-CM | POA: Diagnosis not present

## 2023-04-12 DIAGNOSIS — Z5112 Encounter for antineoplastic immunotherapy: Secondary | ICD-10-CM | POA: Diagnosis not present

## 2023-04-12 DIAGNOSIS — D702 Other drug-induced agranulocytosis: Secondary | ICD-10-CM

## 2023-04-12 DIAGNOSIS — C7951 Secondary malignant neoplasm of bone: Secondary | ICD-10-CM | POA: Diagnosis not present

## 2023-04-12 LAB — CBC WITH DIFFERENTIAL/PLATELET
Abs Immature Granulocytes: 0 10*3/uL (ref 0.00–0.07)
Basophils Absolute: 0 10*3/uL (ref 0.0–0.1)
Basophils Relative: 0 %
Eosinophils Absolute: 0.3 10*3/uL (ref 0.0–0.5)
Eosinophils Relative: 11 %
HCT: 31.7 % — ABNORMAL LOW (ref 36.0–46.0)
Hemoglobin: 10.5 g/dL — ABNORMAL LOW (ref 12.0–15.0)
Immature Granulocytes: 0 %
Lymphocytes Relative: 37 %
Lymphs Abs: 1.1 10*3/uL (ref 0.7–4.0)
MCH: 36.1 pg — ABNORMAL HIGH (ref 26.0–34.0)
MCHC: 33.1 g/dL (ref 30.0–36.0)
MCV: 108.9 fL — ABNORMAL HIGH (ref 80.0–100.0)
Monocytes Absolute: 0.3 10*3/uL (ref 0.1–1.0)
Monocytes Relative: 11 %
Neutro Abs: 1.2 10*3/uL — ABNORMAL LOW (ref 1.7–7.7)
Neutrophils Relative %: 41 %
Platelets: 185 10*3/uL (ref 150–400)
RBC: 2.91 MIL/uL — ABNORMAL LOW (ref 3.87–5.11)
RDW: 13.8 % (ref 11.5–15.5)
WBC: 2.9 10*3/uL — ABNORMAL LOW (ref 4.0–10.5)
nRBC: 0 % (ref 0.0–0.2)

## 2023-04-12 LAB — MAGNESIUM: Magnesium: 1.9 mg/dL (ref 1.7–2.4)

## 2023-04-12 LAB — COMPREHENSIVE METABOLIC PANEL
ALT: 18 U/L (ref 0–44)
AST: 23 U/L (ref 15–41)
Albumin: 3.5 g/dL (ref 3.5–5.0)
Alkaline Phosphatase: 126 U/L (ref 38–126)
Anion gap: 6 (ref 5–15)
BUN: 22 mg/dL (ref 8–23)
CO2: 24 mmol/L (ref 22–32)
Calcium: 8.2 mg/dL — ABNORMAL LOW (ref 8.9–10.3)
Chloride: 104 mmol/L (ref 98–111)
Creatinine, Ser: 0.73 mg/dL (ref 0.44–1.00)
GFR, Estimated: >60 mL/min (ref >60)
Glucose, Bld: 123 mg/dL — ABNORMAL HIGH (ref 70–99)
Potassium: 3.8 mmol/L (ref 3.5–5.1)
Sodium: 134 mmol/L — ABNORMAL LOW (ref 135–145)
Total Bilirubin: 0.5 mg/dL (ref 0.3–1.2)
Total Protein: 6.7 g/dL (ref 6.5–8.1)

## 2023-04-12 MED ORDER — FILGRASTIM-SNDZ 480 MCG/0.8ML IJ SOSY
480.0000 ug | PREFILLED_SYRINGE | Freq: Once | INTRAMUSCULAR | Status: AC
  Administered 2023-04-12: 480 ug via SUBCUTANEOUS
  Filled 2023-04-12: qty 0.8

## 2023-04-12 MED ORDER — LACTULOSE 20 GM/30ML PO SOLN: 10.0000 g | Freq: Every evening | ORAL | 0 refills | Status: DC | PRN

## 2023-04-12 NOTE — Patient Instructions (Signed)
[facility]  Discharge Instructions: Thank you for choosing [facility] to provide your oncology and hematology care.  If you have a lab appointment with the Cancer Center - please note that after April 8th, 2024, all labs will be drawn in the cancer center.  You do not have to check in or register with the main entrance as you have in the past but will complete your check-in in the cancer center.  Wear comfortable clothing and clothing appropriate for easy access to any Portacath or PICC line.   We strive to give you quality time with your provider. You may need to reschedule your appointment if you arrive late (15 or more minutes).  Arriving late affects you and other patients whose appointments are after yours.  Also, if you miss three or more appointments without notifying the office, you may be dismissed from the clinic at the provider's discretion.      For prescription refill requests, have your pharmacy contact our office and allow 72 hours for refills to be completed.    Today you received the following chemotherapy and/or immunotherapy agents Zarxio     To help prevent nausea and vomiting after your treatment, we encourage you to take your nausea medication as directed.  BELOW ARE SYMPTOMS THAT SHOULD BE REPORTED IMMEDIATELY: *FEVER GREATER THAN 100.4 F (38 C) OR HIGHER *CHILLS OR SWEATING *NAUSEA AND VOMITING THAT IS NOT CONTROLLED WITH YOUR NAUSEA MEDICATION *UNUSUAL SHORTNESS OF BREATH *UNUSUAL BRUISING OR BLEEDING *URINARY PROBLEMS (pain or burning when urinating, or frequent urination) *BOWEL PROBLEMS (unusual diarrhea, constipation, pain near the anus) TENDERNESS IN MOUTH AND THROAT WITH OR WITHOUT PRESENCE OF ULCERS (sore throat, sores in mouth, or a toothache) UNUSUAL RASH, SWELLING OR PAIN  UNUSUAL VAGINAL DISCHARGE OR ITCHING   Items with * indicate a potential emergency and should be followed up as soon as possible or go to the  Emergency Department if any problems should occur.  Please show the CHEMOTHERAPY ALERT CARD or IMMUNOTHERAPY ALERT CARD at check-in to the Emergency Department and triage nurse.  Should you have questions after your visit or need to cancel or reschedule your appointment, please contact [facility] [phone]  and follow the prompts.  Office hours are 8:00 a.m. to 4:30 p.m. Monday - Friday. Please note that voicemails left after 4:00 p.m. may not be returned until the following business day.  We are closed weekends and major holidays. You have access to a nurse at all times for urgent questions. Please call the main number to the clinic [phone] and follow the prompts.  For any non-urgent questions, you may also contact your provider using MyChart. We now offer e-Visits for anyone 58 and older to request care online for non-urgent symptoms. For details visit [email or web address].   Also download the MyChart app! Go to the app store, search "MyChart", open the app, select , and log in with your MyChart username and password.

## 2023-04-12 NOTE — Progress Notes (Signed)
Patient presents today for Zarxio injection per providers order.  Vital signs WNL.  Patient has no new complaints at this time.  Stable during administration without incident; injection site WNL; see MAR for injection details.  Patient tolerated procedure well and without incident.  No questions or complaints noted at this time.

## 2023-04-12 NOTE — Telephone Encounter (Signed)
Patient c/o constipation and has not had a bm x 7 days.  Per standing orders, lactulose 10 g Q hs sent into pharmacy and instructions for use given.

## 2023-04-12 NOTE — Progress Notes (Signed)
[facility]ise [name] [address], [place] [address]    Clinic Day:  04/13/2023  Referring physician: Toma [name], Denise  Patient Care Team: Denise [name], Denise as PCP - General (Internal Medicine) Denise [name], Denise as Medical Oncologist (Medical Oncology)   ASSESSMENT & PLAN:   Assessment: 1.  T3N3c (stage IIIc) triple negative invasive lobular carcinoma of the left [name]: - She felt lump in her left [name] for more than 6 months, but thought it was secondary to fibrocystic disease which she had all her life.  When she started having pain, she reached out to [name]. - Ultrasound-guided left [name] and left axillary lymph node biopsy on 01/15/2021 - Pathology consistent with invasive lobular carcinoma, E-cadherin negative.  ER/PR/HER2 negative.  HER2 2+ by IHC, negative by FISH.  Ki-67 is 5%.  Lymph node core biopsy was consistent with metastatic carcinoma.  Grade 2. - PET scan on 02/03/2021 showed involvement of left supraclavicular, subpectoral, axillary lymph nodes along with the [name] mass.  10 mm left lung nodule which is hypometabolic.  Spinal cord lesion at T12 level with a strong uptake. - MRI of the lumbar spine with and without contrast on 02/20/2021 showed no mass or abnormal enhancement within the canal at the T12 level to correspond to the site of PET scan positive.  No marrow replacing bone lesion. - 12 weeks of carboplatin and paclitaxel weekly and every 3 weeks pembrolizumab followed by 4 cycles of dose dense AC with pembrolizumab ([id number]) from 02/27/2021 through 10/01/2021 - PET scan on 10/16/2021: Reduction in metabolic activity in the size of the left axillary lymph node and no evidence of [name] hypermetabolism on PET scan. - Left mastectomy and lymph node excision by Dr. Magnus [name] on 11/20/2021 - Pathology: 6.2 cm invasive lobular carcinoma, grade 2, margins negative.  19/21 lymph nodes involved with macrometastasis.  ypT3, YPN3A - Adjuvant  pembrolizumab started on 01/01/2022. -CREATE-X capecitabine 1500 mg twice daily 2 weeks on/1 week off started on 01/01/2022.  Last dose of pembrolizumab on 05/11/2022 - Germline mutation testing ([facility]) negative - [employer] (06/04/2022): T p53, K-ras V14 I, CDKN2A.  MSI-high not detected. - PET scan (05/28/2022): Multifocal bone lesions involving T11 vertebral body, right posterior sixth rib, left femoral neck, left scapular glenoid.  No other metastatic disease. - Sacituzumab cycle 1 on 08/18/2022.  She was hospitalized with neutropenic fever and severe diarrhea from 08/24/2022 through 08/27/2022. - CTAP (10/25/2021): Generalized mild to moderate wall thickening throughout the nondistended large bowel with faint pericolonic fat haziness compatible with nonspecific infectious/inflammatory colitis.  No free air.  C. difficile x 2 was negative. - She has UG T1 A1*28 heterozygosity. - Cycle 2 Sacituzumab dose reduced by 50%.  Discontinued secondary to poor tolerance. - Enhertu 3.2 Mg/KG cycle 1 on 11/04/2022   2.  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  She is non-smoker. - Mother died of [name].  Maternal grandmother died very young in her 66s, sister has fibrocystic disease.  Father died of lung cancer and was a smoker.    Plan: 1.  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 - Developed sore throat on 03/18/2023, tested positive for COVID at home. - She went to the ER on 03/20/2023 at [name] with nausea vomiting and diarrhea.  Given some IV fluids. - She went to the ER at [name] on 03/24/2023.  She had complained of abdominal pain, nausea vomiting and diarrhea. - She presented to the ER again on 03/28/2023: CT [facility] left sigmoid diverticulosis without evidence of diverticulitis. - She reports abdominal pain, mostly in the epigastric region and burning type.  Also has some nausea and vomiting but  denied any diarrhea.  GI cocktail given in the ER helped for 2 to 3 hours.  She was also given lidocaine with Maalox which helps for 30 minutes. - Yesterday she was given Carafate and Phenergan suppository which she has not filled yet. - Reviewed labs today: Normal LFTs except elevated alk phos.  Creatinine normal.  CBC shows white count is low at 2.2 and ANC of 1.1.  Platelet count was normal.  Hemoglobin is close to 11. - Will give her Pepcid 40 mg IV and Phenergan suppository.  She was also given IV Zofran and IV fluids with electrolytes today. - I will arrange her for fluids with electrolytes, Pepcid and Zofran daily tomorrow and Wednesday.  2.  Brain lesions (SRS to 3 brain lesions on 06/16/2022): - Repeat MRI scheduled on 04/19/2023.  3.  Peripheral neuropathy: - Continue gabapentin 300 mg at bedtime.  4.  Hypomagnesemia: - Continue magnesium supplements.  Magnesium is normal today.    No orders of the defined types were placed in this encounter.      Denise [name],acting as a Neurosurgeon for Denise [name], Denise.,have documented all relevant documentation on the behalf of Denise [name], Denise,as directed by  Denise [name], Denise while in the presence of Denise [name], Denise.  ***    Denise [name]   7/29/202412:57 PM  CHIEF COMPLAINT:   Diagnosis: Metastatic triple negative [name] to the bones    Cancer Staging  Invasive lobular carcinoma of left [name] in female Denise [name], Denise [name], Denise [name], Denise [name], ER-, PR-, HER2- - Unsigned - Pathologic stage from [name]: No Stage Recommended (ypT3, pN3a, cM0, Denise [name], ER-, PR-, HER2-) - Signed by Denise [name], Denise [name]    Prior Therapy: 1.  Neoadjuvant chemotherapy with weekly carboplatin and paclitaxel and dose dense AC with pembrolizumab 2. Left mastectomy and lymph node excision by Dr. Magnus [name] on 11/20/2021 3.  Sacituzumab on 08/18/2022 and  10/08/2021, discontinued due to poor tolerance.  Current Therapy:  Enhertu every 21 days    HISTORY OF PRESENT ILLNESS:   Oncology History  Invasive lobular carcinoma of left [name] in female [facility])  01/23/2021 Initial Diagnosis   Invasive lobular carcinoma of left [name] in female [facility])   02/19/2021 Genetic Testing   Negative genetic testing on the CancerNext-Expanded+RNAinsight panel.  SMARCB1 VUS identified.  The CancerNext-Expanded gene panel offered by [name] and includes sequencing and rearrangement analysis for the following 77 genes: AIP, ALK, APC*, ATM*, AXIN2, BAP1, BARD1, BLM, BMPR1A, BRCA1*, BRCA2*, BRIP1*, CDC73, CDH1*, CDK4, CDKN1B, CDKN2A, CHEK2*, CTNNA1, DICER1, FANCC, FH, FLCN, GALNT12, KIF1B, LZTR1, MAX, MEN1, MET, MLH1*, MSH2*, MSH3, MSH6*, MUTYH*, NBN, NF1*, NF2, NTHL1, PALB2*, PHOX2B, PMS2*, POT1, PRKAR1A, PTCH1, PTEN*, RAD51C*, RAD51D*, RB1, RECQL, RET, SDHA, SDHAF2, SDHB, SDHC, SDHD, SMAD4, SMARCA4, SMARCB1, SMARCE1, STK11, SUFU, TMEM127, TP53*, TSC1, TSC2, VHL and XRCC2 (sequencing and deletion/duplication); EGFR, EGLN1, HOXB13, KIT, MITF, PDGFRA, POLD1, and POLE (sequencing only); EPCAM and GREM1 (deletion/duplication only). DNA and RNA analyses performed for * genes. The report date is February 19, 2021.   02/27/2021 - 05/11/2022 Chemotherapy   Patient is on Treatment Plan : [name] Pembrolizumab + Carboplatin D1,8,15+ Paclitaxel  D1,8,15 q21d X 4 cycles / Pembrolizumab + AC q21d x 4 cycles     [name] Staging   Staging form: [name], Denise 8th Edition - Pathologic stage from [name]: No Stage Recommended (ypT3, pN3a, cM0, Denise [name], ER-, PR-, HER2-) - Signed by Denise [name], Denise [name] Histopathologic type: Lobular carcinoma, NOS Stage prefix: Post-therapy Method of lymph node assessment: Axillary lymph node dissection Multigene prognostic tests performed: None Histologic grading system: 3 grade system   08/18/2022 - 10/08/2022 Chemotherapy   Patient is  on Treatment Plan : [name] METASTATIC Sacituzumab govitecan-hziy Drinda Butts) D1,8 q21d     11/04/2022 -  Chemotherapy   Patient is on Treatment Plan : [name] METASTATIC Fam-Trastuzumab Deruxtecan-nxki (Enhertu) (5.4) q21d        INTERVAL HISTORY:   Denise [name] is a 72 y.o. female presenting to clinic today for follow up of Metastatic triple negative [name] to the bones. She was last seen by me on 03/29/23.  Today, she states that she is doing well overall. Her appetite level is at ***%. Her energy level is at ***%.   PAST MEDICAL HISTORY:   Past Medical History: Past Medical History:  Diagnosis Date   Asthma    as child   Cancer (HCC) 12/2020   left [name] [facility]   Complication of anesthesia    pateitn states,' I coded when I had my D&Cmany years ago.   Dyspnea    Family history of [name]    Hypertension    Hypothyroidism    PONV (postoperative nausea and vomiting)    Port-A-Cath in place 02/26/2021    Surgical History: Past Surgical History:  Procedure Laterality Date   ABDOMINAL HYSTERECTOMY     BIOPSY  01/17/2018   Procedure: BIOPSY;  Surgeon: Malissa Hippo, Denise;  [facility];  Service: Endoscopy;;  duodenum,gastric   CHOLECYSTECTOMY     COLONOSCOPY WITH PROPOFOL N/A 01/17/2018   Procedure: COLONOSCOPY WITH PROPOFOL;  Surgeon: Malissa Hippo, Denise;  [facility];  Service: Endoscopy;  Laterality: N/A;  7:30   DILATION AND CURETTAGE OF UTERUS     ESOPHAGOGASTRODUODENOSCOPY (EGD) WITH PROPOFOL N/A 01/17/2018   Procedure: ESOPHAGOGASTRODUODENOSCOPY (EGD) WITH PROPOFOL;  Surgeon: Malissa Hippo, Denise;  [facility];  Service: Endoscopy;  Laterality: N/A;   POLYPECTOMY  01/17/2018   Procedure: POLYPECTOMY;  Surgeon: Malissa Hippo, Denise;  [facility];  Service: Endoscopy;;  transverse colon, cecal   PORTACATH PLACEMENT Right 02/17/2021   Procedure: INSERTION PORT-A-CATH;  Surgeon: Abigail Miyamoto, Denise;  Location: [name];  Service: General;  Laterality: Right;   RADIOACTIVE SEED GUIDED AXILLARY SENTINEL LYMPH NODE Left 11/20/2021   Procedure: RADIOACTIVE SEED GUIDED LEFT AXILLARY SENTINEL LYMPH NODE DISSECTION;  Surgeon: Abigail Miyamoto, Denise;  Location: [name];  Service: General;  Laterality: Left;   TOTAL MASTECTOMY Left 11/20/2021   Procedure: LEFT TOTAL MASTECTOMY;  Surgeon: Abigail Miyamoto, Denise;  Location: [name];  Service: General;  Laterality: Left;    Social History: Social History   Socioeconomic History   Marital status: Widowed    Spouse name: Not on file   Number of children: 3   Years of education: Not on file   Highest education level: Not on file  Occupational History   Occupation: [employer] [name] [employer]   Occupation: retired  Tobacco Use   Smoking status: Never   Smokeless tobacco: Never  Vaping Use   Vaping status:  Never Used  Substance and Sexual Activity   Alcohol use: Never   Drug use: Never   Sexual activity: Not Currently    Birth control/protection: Surgical  Other Topics Concern   Not on file  Social History Narrative   Not on file   Social Determinants of Health   Financial Resource Strain: Low Risk  (07/10/2022)   Overall Financial Resource Strain (CARDIA)    Difficulty of Paying Living Expenses: Not hard at all  Food Insecurity: No Food Insecurity (06/30/2022)   Hunger Vital Sign    Worried About Running Out of Food in the Last Year: Never true    Ran Out of Food in the Last Year: Never true  Transportation Needs: No Transportation Needs (07/10/2022)   PRAPARE - Administrator, Civil Service (Medical): No    Lack of Transportation (Non-Medical): No  Physical Activity: Insufficiently Active (01/22/2021)   Exercise Vital Sign    Days of Exercise per Week: 3 days    Minutes of Exercise per Session: 30 min  Stress: No Stress Concern Present (01/22/2021)   [employer] of Occupational Health  - Occupational Stress Questionnaire    Feeling of Stress : Not at all  Social Connections: Moderately Integrated (01/22/2021)   Social Connection and Isolation Panel [NHANES]    Frequency of Communication with Friends and Family: More than three times a week    Frequency of Social Gatherings with Friends and Family: More than three times a week    Attends Religious Services: More than 4 times per year    Active Member of [employer] or Organizations: No    Attends Engineer, structural: More than 4 times per year    Marital Status: Widowed  Intimate Partner Violence: Not At Risk (05/28/2022)   Humiliation, Afraid, Rape, and Kick questionnaire    Fear of Current or Ex-Partner: No    Emotionally Abused: No    Physically Abused: No    Sexually Abused: No    Family History: Family History  Problem Relation Age of Onset   [name] Mother        dx in her 42s   Heart disease Mother    Thyroid disease Mother    Lung cancer Father        dx in his 83s   Thyroid disease Maternal Aunt    Thyroid nodules Maternal Grandmother 35       goiter   Thyroid disease Maternal Grandmother    Heart disease Maternal Grandfather    Heart disease Paternal Grandmother    Heart disease Paternal Grandfather    Thyroid disease Daughter    Thyroid disease Daughter    Cancer Maternal Uncle        NOS   Cancer Paternal Uncle        NOS   [name] Cousin        pat first cousin died in her 63s;     Current Medications:  Current Outpatient Medications:    Acetaminophen (TYLENOL PO), Take 1 tablet by mouth as needed., Disp: , Rfl:    gabapentin (NEURONTIN) 300 MG capsule, Take 1 capsule by mouth 2 (two) times daily. (Patient not taking: Reported on 04/01/2023), Disp: , Rfl:    hydrALAZINE (APRESOLINE) 25 MG tablet, Take 25 mg by mouth 3 (three) times daily. (Patient not taking: Reported on 04/01/2023), Disp: , Rfl:    levothyroxine (SYNTHROID) 75 MCG tablet, Take 75 mcg by mouth daily before  breakfast., Disp: ,  Rfl:    lidocaine (XYLOCAINE) 2 % solution, Use as directed 15 mLs in the mouth or throat as needed (reflux)., Disp: 100 mL, Rfl: 2   Melatonin 5 MG TABS, Take 10 mg by mouth. As needed for sleep (Patient not taking: Reported on 04/01/2023), Disp: , Rfl:    metoCLOPramide (REGLAN) 10 MG tablet, Take 1 tablet (10 mg total) by mouth every 6 (six) hours as needed for nausea or vomiting. (Patient not taking: Reported on 04/01/2023), Disp: 30 tablet, Rfl: 0   midodrine (PROAMATINE) 10 MG tablet, Take 10 mg by mouth daily. (Patient not taking: Reported on 04/01/2023), Disp: , Rfl:    olmesartan-hydrochlorothiazide (BENICAR HCT) 40-25 MG tablet, Take 1 tablet by mouth daily., Disp: , Rfl:    pantoprazole (PROTONIX) 40 MG tablet, Take 40 mg by mouth daily., Disp: , Rfl:    pantoprazole (PROTONIX) 40 MG tablet, Take 1 tablet (40 mg total) by mouth daily., Disp: 60 tablet, Rfl: 3   promethazine (PHENERGAN) 25 MG suppository, Place 1 suppository (25 mg total) rectally every 6 (six) hours as needed for nausea or vomiting., Disp: 12 each, Rfl: 0   sucralfate (CARAFATE) 1 g tablet, Take 1 tablet (1 g total) by mouth every 4 (four) hours as needed., Disp: 180 tablet, Rfl: 0   Allergies: Allergies  Allergen Reactions   Exforge [Amlodipine Besylate-Valsartan] Nausea And Vomiting   Gadolinium Derivatives Hives, Itching and Anaphylaxis    13hr prep prior to contrast admin  MRI iv contrast   Gadopiclenol Hives and Itching    13 hr prep prior to contrast admin    Tape Other (See Comments)    Redness and Irriatation   Cheese     Hard cheese   Levofloxacin In D5w Hives   Other    Proanthocyanidin    Strawberry Extract Diarrhea    Seeds,nuts, lettuce, grapes   Wild Lettuce Extract (Lactuca Virosa)    Yeast-Related Products Hives    Mold on bread    REVIEW OF SYSTEMS:   Review of Systems  Constitutional:  Negative for chills, fatigue and fever.  HENT:   Negative for lump/mass, mouth  sores, nosebleeds, sore throat and trouble swallowing.   Eyes:  Negative for eye problems.  Respiratory:  Negative for cough and shortness of breath.   Cardiovascular:  Negative for chest pain, leg swelling and palpitations.  Gastrointestinal:  Negative for abdominal pain, constipation, diarrhea, nausea and vomiting.  Genitourinary:  Negative for bladder incontinence, difficulty urinating, dysuria, frequency, hematuria and nocturia.   Musculoskeletal:  Negative for arthralgias, back pain, flank pain, myalgias and neck pain.  Skin:  Negative for itching and rash.  Neurological:  Negative for dizziness, headaches and numbness.  Hematological:  Does not bruise/bleed easily.  Psychiatric/Behavioral:  Negative for depression, sleep disturbance and suicidal ideas. The patient is not nervous/anxious.   All other systems reviewed and are negative.    VITALS:   There were no vitals taken for this visit.  Wt Readings from Last 3 Encounters:  04/01/23 153 lb 12.8 oz (69.8 kg)  03/30/23 154 lb 9.6 oz (70.1 kg)  03/29/23 154 lb 3.2 oz (69.9 kg)    There is no height or weight on file to calculate BMI.  Performance status (ECOG): 1 - Symptomatic but completely ambulatory  PHYSICAL EXAM:   Physical Exam Vitals and nursing note reviewed. Exam conducted with a chaperone present.  Constitutional:      Appearance: Normal appearance.  Cardiovascular:     Rate  and Rhythm: Normal rate and regular rhythm.     Pulses: Normal pulses.     Heart sounds: Normal heart sounds.  Pulmonary:     Effort: Pulmonary effort is normal.     Breath sounds: Normal breath sounds.  Abdominal:     Palpations: Abdomen is soft. There is no hepatomegaly, splenomegaly or mass.     Tenderness: There is no abdominal tenderness.  Musculoskeletal:     Right lower leg: No edema.     Left lower leg: No edema.  Lymphadenopathy:     Cervical: No cervical adenopathy.     Right cervical: No superficial, deep or posterior  cervical adenopathy.    Left cervical: No superficial, deep or posterior cervical adenopathy.     Upper Body:     Right upper body: No supraclavicular or axillary adenopathy.     Left upper body: No supraclavicular or axillary adenopathy.  Neurological:     General: No focal deficit present.     Mental Status: She is alert and oriented to person, place, and time.  Psychiatric:        Mood and Affect: Mood normal.        Behavior: Behavior normal.     LABS:      Latest Ref Rng & Units 03/29/2023   10:14 AM 03/28/2023   12:05 PM 03/24/2023   11:50 AM  CBC  WBC 4.0 - 10.5 K/uL 2.2  1.9  2.2   Hemoglobin 12.0 - 15.0 g/dL 96.0  45.4  09.8   Hematocrit 36.0 - 46.0 % 32.5  32.9  33.1   Platelets 150 - 400 K/uL 156  154  116       Latest Ref Rng & Units 03/29/2023   10:14 AM 03/28/2023   12:05 PM 03/24/2023   11:09 AM  CMP  Glucose 70 - 99 mg/dL 82  86  85   BUN 8 - 23 mg/dL 17  17  21    Creatinine 0.44 - 1.00 mg/dL 1.19  1.47  8.29   Sodium 135 - 145 mmol/L 135  135  134   Potassium 3.5 - 5.1 mmol/L 3.6  3.8  3.7   Chloride 98 - 111 mmol/L 105  105  104   CO2 22 - 32 mmol/L 20  20  19    Calcium 8.9 - 10.3 mg/dL 8.7  8.5  8.4   Total Protein 6.5 - 8.1 g/dL 6.7  6.8    Total Bilirubin 0.3 - 1.2 mg/dL 0.8  0.7    Alkaline Phos 38 - 126 U/L 157  158    AST 15 - 41 U/L 28  32    ALT 0 - 44 U/L 28  31       No results found for: "CEA1", "CEA" / No results found for: "CEA1", "CEA" No results found for: "PSA1" No results found for: "FAO130" No results found for: "CAN125"  No results found for: "TOTALPROTELP", "ALBUMINELP", "A1GS", "A2GS", "BETS", "BETA2SER", "GAMS", "MSPIKE", "SPEI" No results found for: "TIBC", "FERRITIN", "IRONPCTSAT" No results found for: "LDH"   STUDIES:   CT ABDOMEN PELVIS W CONTRAST  Result Date: 03/28/2023 CLINICAL DATA:  Abdominal pain. EXAM: CT ABDOMEN AND PELVIS WITH CONTRAST TECHNIQUE: Multidetector CT imaging of the abdomen and pelvis was  performed using the standard protocol following bolus administration of intravenous contrast. RADIATION DOSE REDUCTION: This exam was performed according to the departmental dose-optimization program which includes automated exposure control, adjustment of the mA and/or kV according to  patient size and/or use of iterative reconstruction technique. CONTRAST:  OMNIPAQUE IOHEXOL 300 MG/ML  SOLN COMPARISON:  Jan 29, 2023 FINDINGS: Lower chest: Subtle area of ground-glass opacities in the right middle lobe measures 2.0 cm. Other subtle areas of airspace consolidation versus atelectasis are seen in a more linear fashion in the right lower lobe. Hepatobiliary: No focal liver abnormality is seen. Status post cholecystectomy. No biliary dilatation. Pancreas: Unremarkable. No pancreatic ductal dilatation or surrounding inflammatory changes. Spleen: Normal in size without focal abnormality. Adrenals/Urinary Tract: Adrenal glands are unremarkable. Kidneys are normal, without renal calculi, focal lesion, or hydronephrosis. Bladder is unremarkable. Stomach/Bowel: Stomach is within normal limits. There is no evidence of appendicitis. No evidence of bowel wall thickening, distention, or inflammatory changes. Left sigmoid colon diverticulosis without evidence of acute diverticulitis. Vascular/Lymphatic: No significant vascular findings are present. No enlarged abdominal or pelvic lymph nodes. Reproductive: Status post hysterectomy. No adnexal masses. Other: No abdominal wall hernia or abnormality. No abdominopelvic ascites. Musculoskeletal: Post left mastectomy. Again seen is heterogeneity and expansion of the posterior right sixth rib, T11 sclerotic lesion, and T8 lytic lesion. The skeletal findings are stable. Lytic lesion within the left iliac bone measures 1.6 cm. Right iliac subcentimeter lytic lesion is unchanged. 3.1 cm subcutaneous fluid density mass in the left lateral abdominal wall. IMPRESSION: 1. No acute  abnormality identified within the abdomen or pelvis. 2. Subtle area of ground-glass opacities in the right middle lobe measures 2.0 cm. Other subtle areas of airspace consolidation versus atelectasis are seen in a more linear fashion in the right lower lobe. These findings may represent an infectious or inflammatory process. Attention on follow-up is recommended. 3. Left sigmoid colon diverticulosis without evidence of acute diverticulitis. 4. Stable skeletal findings, consistent with metastatic disease. 5. 3.1 cm subcutaneous fluid density mass in the left lateral abdominal wall, benign by appearance. Electronically Signed   By: Ted Mcalpine M.D.   On: 03/28/2023 14:25   DG Chest Port 1 View  Result Date: 03/24/2023 CLINICAL DATA:  Chest pain and shortness of breath.  COVID positive. EXAM: PORTABLE CHEST 1 VIEW COMPARISON:  CT chest dated Jan 29, 2023. Chest x-ray dated January 04, 2023. FINDINGS: Unchanged right chest wall port catheter. Unchanged mild cardiomegaly. Normal pulmonary vascularity. Unchanged small calcified granuloma in the left upper lobe and 1.1 cm hamartoma in the left lower lobe. No focal consolidation, pleural effusion, or pneumothorax. No acute osseous abnormality. Unchanged right posterior sixth rib metastasis. IMPRESSION: 1. No active disease. Electronically Signed   By: Obie Dredge M.D.   On: 03/24/2023 11:50

## 2023-04-13 ENCOUNTER — Inpatient Hospital Stay: Payer: Medicare Other | Admitting: Hematology

## 2023-04-13 ENCOUNTER — Inpatient Hospital Stay: Payer: Medicare Other

## 2023-04-13 ENCOUNTER — Other Ambulatory Visit: Payer: Self-pay

## 2023-04-13 VITALS — BP 116/72 | HR 60 | Temp 97.9°F | Resp 18

## 2023-04-13 DIAGNOSIS — Z5112 Encounter for antineoplastic immunotherapy: Secondary | ICD-10-CM | POA: Diagnosis not present

## 2023-04-13 DIAGNOSIS — Z95828 Presence of other vascular implants and grafts: Secondary | ICD-10-CM

## 2023-04-13 DIAGNOSIS — Z5189 Encounter for other specified aftercare: Secondary | ICD-10-CM | POA: Diagnosis not present

## 2023-04-13 DIAGNOSIS — C7951 Secondary malignant neoplasm of bone: Secondary | ICD-10-CM | POA: Diagnosis not present

## 2023-04-13 DIAGNOSIS — C50912 Malignant neoplasm of unspecified site of left female breast: Secondary | ICD-10-CM | POA: Diagnosis not present

## 2023-04-13 DIAGNOSIS — C778 Secondary and unspecified malignant neoplasm of lymph nodes of multiple regions: Secondary | ICD-10-CM | POA: Diagnosis not present

## 2023-04-13 DIAGNOSIS — R748 Abnormal levels of other serum enzymes: Secondary | ICD-10-CM | POA: Diagnosis not present

## 2023-04-13 MED ORDER — DEXTROSE 5 % IV SOLN: Freq: Once | INTRAVENOUS | Status: AC

## 2023-04-13 MED ORDER — CETIRIZINE HCL 10 MG/ML IV SOLN
10.0000 mg | Freq: Once | INTRAVENOUS | Status: AC
  Administered 2023-04-13: 10 mg via INTRAVENOUS
  Filled 2023-04-13: qty 1

## 2023-04-13 MED ORDER — HEPARIN SOD (PORK) LOCK FLUSH 100 UNIT/ML IV SOLN
500.0000 [IU] | Freq: Once | INTRAVENOUS | Status: AC | PRN
  Administered 2023-04-13: 500 [IU]

## 2023-04-13 MED ORDER — ACETAMINOPHEN 325 MG PO TABS
650.0000 mg | ORAL_TABLET | Freq: Once | ORAL | Status: AC
  Administered 2023-04-13: 650 mg via ORAL
  Filled 2023-04-13: qty 2

## 2023-04-13 MED ORDER — SODIUM CHLORIDE 0.9 % IV SOLN
150.0000 mg | Freq: Once | INTRAVENOUS | Status: AC
  Administered 2023-04-13: 150 mg via INTRAVENOUS
  Filled 2023-04-13: qty 150

## 2023-04-13 MED ORDER — SODIUM CHLORIDE 0.9% FLUSH
10.0000 mL | INTRAVENOUS | Status: DC | PRN
  Administered 2023-04-13: 10 mL

## 2023-04-13 MED ORDER — SODIUM CHLORIDE 0.9 % IV SOLN
10.0000 mg | Freq: Once | INTRAVENOUS | Status: AC
  Administered 2023-04-13: 10 mg via INTRAVENOUS
  Filled 2023-04-13: qty 10

## 2023-04-13 MED ORDER — PALONOSETRON HCL INJECTION 0.25 MG/5ML
0.2500 mg | Freq: Once | INTRAVENOUS | Status: AC
  Administered 2023-04-13: 0.25 mg via INTRAVENOUS
  Filled 2023-04-13: qty 5

## 2023-04-13 MED ORDER — FAM-TRASTUZUMAB DERUXTECAN-NXKI CHEMO 100 MG IV SOLR
4.4000 mg/kg | Freq: Once | INTRAVENOUS | Status: AC
  Administered 2023-04-13: 340 mg via INTRAVENOUS
  Filled 2023-04-13: qty 17

## 2023-04-13 NOTE — Progress Notes (Signed)
Patient presents today for Enhertu infusion. Patient is in satisfactory condition with no new complaints voiced.  Vital signs are stable.  Labs reviewed from 04/13/23 by Dr. Ellin Saba during the office visit and all other labs are within treatment parameters. Pt's ANC is 1.2 today, Dr.K made aware. We will proceed with treatment per MD orders.   Treatment given today per MD orders. Tolerated infusion without adverse affects. Vital signs stable. No complaints at this time. Discharged from clinic ambulatory in stable condition. Alert and oriented x 3. F/U with [facility] as scheduled.

## 2023-04-13 NOTE — Patient Instructions (Signed)
[facility]   Discharge Instructions: Thank you for choosing [facility] to provide your oncology and hematology care.  If you have a lab appointment with the Cancer Center - please note that after April 8th, 2024, all labs will be drawn in the cancer center.  You do not have to check in or register with the main entrance as you have in the past but will complete your check-in in the cancer center.  Wear comfortable clothing and clothing appropriate for easy access to any Portacath or PICC line.   We strive to give you quality time with your provider. You may need to reschedule your appointment if you arrive late (15 or more minutes).  Arriving late affects you and other patients whose appointments are after yours.  Also, if you miss three or more appointments without notifying the office, you may be dismissed from the clinic at the provider's discretion.      For prescription refill requests, have your pharmacy contact our office and allow 72 hours for refills to be completed.    Today you received the following chemotherapy and/or immunotherapy agents Enhertu   To help prevent nausea and vomiting after your treatment, we encourage you to take your nausea medication as directed.  Fam-Trastuzumab Deruxtecan Injection What is this medication? FAM-TRASTUZUMAB DERUXTECAN (fam-tras TOOZ eu mab DER ux TEE kan) treats some types of cancer. It works by blocking a protein that causes cancer cells to grow and multiply. This helps to slow or stop the spread of cancer cells. This medicine may be used for other purposes; ask your health care provider or pharmacist if you have questions. COMMON BRAND NAME(S): ENHERTU What should I tell my care team before I take this medication? They need to know if you have any of these conditions: Heart disease Heart failure Infection, especially a viral infection, such as chickenpox, cold sores, or herpes Liver disease Lung or  breathing disease, such as asthma or COPD An unusual or allergic reaction to fam-trastuzumab deruxtecan, other medications, foods, dyes, or preservatives Pregnant or trying to get pregnant Breast-feeding How should I use this medication? This medication is injected into a vein. It is given by your care team in a hospital or clinic setting. A special MedGuide will be given to you before each treatment. Be sure to read this information carefully each time. Talk to your care team about the use of this medication in children. Special care may be needed. Overdosage: If you think you have taken too much of this medicine contact a poison control center or emergency room at once. NOTE: This medicine is only for you. Do not share this medicine with others. What if I miss a dose? It is important not to miss your dose. Call your care team if you are unable to keep an appointment. What may interact with this medication? Interactions are not expected. This list may not describe all possible interactions. Give your health care provider a list of all the medicines, herbs, non-prescription drugs, or dietary supplements you use. Also tell them if you smoke, drink alcohol, or use illegal drugs. Some items may interact with your medicine. What should I watch for while using this medication? Visit your care team for regular checks on your progress. Tell your care team if your symptoms do not start to get better or if they get worse. Your condition will be monitored carefully while you are receiving this medication. Do not become pregnant while taking this medication   or for 7 months after stopping it. Women should inform their care team if they wish to become pregnant or think they might be pregnant. Men should not father a child while taking this medication and for 4 months after stopping it. There is potential for serious side effects to an unborn child. Talk to your care team for more information. Do not  breast-feed an infant while taking this medication or for 7 months after the last dose. This medication has caused decreased sperm counts in some men. This may make it more difficult to father a child. Talk to your care team if you are concerned about your fertility. This medication may increase your risk to bruise or bleed. Call your care team if you notice any unusual bleeding. Be careful brushing or flossing your teeth or using a toothpick because you may get an infection or bleed more easily. If you have any dental work done, tell your dentist you are receiving this medication. This medication may cause dry eyes and blurred vision. If you wear contact lenses, you may feel some discomfort. Lubricating eye drops may help. See your care team if the problem does not go away or is severe. This medication may increase your risk of getting an infection. Call your care team for advice if you get a fever, chills, sore throat, or other symptoms of a cold or flu. Do not treat yourself. Try to avoid being around people who are sick. Avoid taking medications that contain aspirin, acetaminophen, ibuprofen, naproxen, or ketoprofen unless instructed by your care team. These medications may hide a fever. What side effects may I notice from receiving this medication? Side effects that you should report to your care team as soon as possible: Allergic reactions--skin rash, itching, hives, swelling of the face, lips, tongue, or throat Dry cough, shortness of breath or trouble breathing Infection--fever, chills, cough, sore throat, wounds that don't heal, pain or trouble when passing urine, general feeling of discomfort or being unwell Heart failure--shortness of breath, swelling of the ankles, feet, or hands, sudden weight gain, unusual weakness or fatigue Unusual bruising or bleeding Side effects that usually do not require medical attention (report these to your care team if they continue or are  bothersome): Constipation Diarrhea Hair loss Muscle pain Nausea Vomiting This list may not describe all possible side effects. Call your doctor for medical advice about side effects. You may report side effects to FDA at 1-800-FDA-1088. Where should I keep my medication? This medication is given in a hospital or clinic. It will not be stored at home. NOTE: This sheet is a summary. It may not cover all possible information. If you have questions about this medicine, talk to your doctor, pharmacist, or health care provider.  2024 Elsevier/Gold Standard (2021-06-17 00:00:00)   BELOW ARE SYMPTOMS THAT SHOULD BE REPORTED IMMEDIATELY: *FEVER GREATER THAN 100.4 F (38 C) OR HIGHER *CHILLS OR SWEATING *NAUSEA AND VOMITING THAT IS NOT CONTROLLED WITH YOUR NAUSEA MEDICATION *UNUSUAL SHORTNESS OF BREATH *UNUSUAL BRUISING OR BLEEDING *URINARY PROBLEMS (pain or burning when urinating, or frequent urination) *BOWEL PROBLEMS (unusual diarrhea, constipation, pain near the anus) TENDERNESS IN MOUTH AND THROAT WITH OR WITHOUT PRESENCE OF ULCERS (sore throat, sores in mouth, or a toothache) UNUSUAL RASH, SWELLING OR PAIN  UNUSUAL VAGINAL DISCHARGE OR ITCHING   Items with * indicate a potential emergency and should be followed up as soon as possible or go to the Emergency Department if any problems should occur.  Please show the CHEMOTHERAPY  ALERT CARD or IMMUNOTHERAPY ALERT CARD at check-in to the Emergency Department and triage nurse.  Should you have questions after your visit or need to cancel or reschedule your appointment, please contact [facility] [place] [phone]  and follow the prompts.  Office hours are 8:00 a.m. to 4:30 p.m. Monday - Friday. Please note that voicemails left after 4:00 p.m. may not be returned until the following business day.  We are closed weekends and major holidays. You have access to a nurse at all times for urgent questions. Please call the main number  to the clinic [phone] and follow the prompts.  For any non-urgent questions, you may also contact your provider using MyChart. We now offer e-Visits for anyone 18 and older to request care online for non-urgent symptoms. For details visit [email or web address].   Also download the MyChart app! Go to the app store, search "MyChart", open the app, select [place], and log in with your MyChart username and password.   

## 2023-04-13 NOTE — Progress Notes (Signed)
Maintain dose at 340 mg despite slight weight decrease.  Do not round to vial size at this time.    V.O. Dr Carilyn Goodpasture, PharmD

## 2023-04-13 NOTE — Progress Notes (Signed)
Patient has been examined by Dr. Ellin Saba. Vital signs and labs have been reviewed by MD - ANC (1.2), Creatinine, LFTs, hemoglobin, and platelets are within treatment parameters per M.D. - pt may proceed with treatment.  Primary RN and pharmacy notified.

## 2023-04-13 NOTE — Patient Instructions (Signed)

## 2023-04-15 ENCOUNTER — Telehealth: Payer: Self-pay

## 2023-04-15 ENCOUNTER — Inpatient Hospital Stay: Payer: Medicare Other | Attending: Hematology

## 2023-04-15 VITALS — BP 131/79 | HR 66 | Temp 95.0°F | Resp 18

## 2023-04-15 DIAGNOSIS — C50912 Malignant neoplasm of unspecified site of left female breast: Secondary | ICD-10-CM | POA: Diagnosis not present

## 2023-04-15 DIAGNOSIS — Z79890 Hormone replacement therapy (postmenopausal): Secondary | ICD-10-CM | POA: Insufficient documentation

## 2023-04-15 DIAGNOSIS — Z5189 Encounter for other specified aftercare: Secondary | ICD-10-CM | POA: Diagnosis not present

## 2023-04-15 DIAGNOSIS — C7951 Secondary malignant neoplasm of bone: Secondary | ICD-10-CM | POA: Diagnosis not present

## 2023-04-15 DIAGNOSIS — Z803 Family history of malignant neoplasm of breast: Secondary | ICD-10-CM | POA: Diagnosis not present

## 2023-04-15 DIAGNOSIS — Z801 Family history of malignant neoplasm of trachea, bronchus and lung: Secondary | ICD-10-CM | POA: Insufficient documentation

## 2023-04-15 DIAGNOSIS — Z171 Estrogen receptor negative status [ER-]: Secondary | ICD-10-CM | POA: Diagnosis not present

## 2023-04-15 DIAGNOSIS — C7931 Secondary malignant neoplasm of brain: Secondary | ICD-10-CM | POA: Diagnosis not present

## 2023-04-15 DIAGNOSIS — D72819 Decreased white blood cell count, unspecified: Secondary | ICD-10-CM | POA: Diagnosis not present

## 2023-04-15 DIAGNOSIS — G629 Polyneuropathy, unspecified: Secondary | ICD-10-CM | POA: Insufficient documentation

## 2023-04-15 DIAGNOSIS — Z5112 Encounter for antineoplastic immunotherapy: Secondary | ICD-10-CM | POA: Diagnosis not present

## 2023-04-15 DIAGNOSIS — E039 Hypothyroidism, unspecified: Secondary | ICD-10-CM | POA: Diagnosis not present

## 2023-04-15 DIAGNOSIS — D702 Other drug-induced agranulocytosis: Secondary | ICD-10-CM

## 2023-04-15 DIAGNOSIS — I10 Essential (primary) hypertension: Secondary | ICD-10-CM | POA: Diagnosis not present

## 2023-04-15 MED ORDER — FILGRASTIM-SNDZ 480 MCG/0.8ML IJ SOSY
480.0000 ug | PREFILLED_SYRINGE | Freq: Once | INTRAMUSCULAR | Status: AC
  Administered 2023-04-15: 480 ug via SUBCUTANEOUS
  Filled 2023-04-15: qty 0.8

## 2023-04-15 NOTE — Patient Instructions (Signed)
[facility]  Discharge Instructions: Thank you for choosing  Cancer Center to provide your oncology and hematology care.  If you have a lab appointment with the Cancer Center - please note that after April 8th, 2024, all labs will be drawn in the cancer center.  You do not have to check in or register with the main entrance as you have in the past but will complete your check-in in the cancer center.  Wear comfortable clothing and clothing appropriate for easy access to any Portacath or PICC line.   We strive to give you quality time with your provider. You may need to reschedule your appointment if you arrive late (15 or more minutes).  Arriving late affects you and other patients whose appointments are after yours.  Also, if you miss three or more appointments without notifying the office, you may be dismissed from the clinic at the provider's discretion.      For prescription refill requests, have your pharmacy contact our office and allow 72 hours for refills to be completed.    Today you received the following Zarxio injection.  Filgrastim Injection What is this medication? FILGRASTIM (fil GRA stim) lowers the risk of infection in people who are receiving chemotherapy. It works by Systems analyst make more white blood cells, which protects your body from infection. It may also be used to help people who have been exposed to high doses of radiation. It can be used to help prepare your body before a stem cell transplant. It works by helping your bone marrow make and release stem cells into the blood. This medicine may be used for other purposes; ask your health care provider or pharmacist if you have questions. COMMON BRAND NAME(S): Neupogen, Nivestym, Releuko, Zarxio What should I tell my care team before I take this medication? They need to know if you have any of these conditions: History of blood diseases, such as sickle cell anemia Kidney disease Recent  or ongoing radiation An unusual or allergic reaction to filgrastim, pegfilgrastim, latex, rubber, other medications, foods, dyes, or preservatives Pregnant or trying to get pregnant Breast-feeding How should I use this medication? This medication is injected under the skin or into a vein. It is usually given by your care team in a hospital or clinic setting. It may be given at home. If you get this medication at home, you will be taught how to prepare and give it. Use exactly as directed. Take it as directed on the prescription label at the same time every day. Keep taking it unless your care team tells you to stop. It is important that you put your used needles and syringes in a special sharps container. Do not put them in a trash can. If you do not have a sharps container, call your pharmacist or care team to get one. This medication comes with INSTRUCTIONS FOR USE. Ask your pharmacist for directions on how to use this medication. Read the information carefully. Talk to your pharmacist or care team if you have questions. Talk to your care team about the use of this medication in children. While it may be prescribed for children for selected conditions, precautions do apply. Overdosage: If you think you have taken too much of this medicine contact a poison control center or emergency room at once. NOTE: This medicine is only for you. Do not share this medicine with others. What if I miss a dose? It is important not to miss any doses.  Talk to your care team about what to do if you miss a dose. What may interact with this medication? Medications that may cause a release of neutrophils, such as lithium This list may not describe all possible interactions. Give your health care provider a list of all the medicines, herbs, non-prescription drugs, or dietary supplements you use. Also tell them if you smoke, drink alcohol, or use illegal drugs. Some items may interact with your medicine. What should I  watch for while using this medication? Your condition will be monitored carefully while you are receiving this medication. You may need bloodwork while taking this medication. Talk to your care team about your risk of cancer. You may be more at risk for certain types of cancer if you take this medication. What side effects may I notice from receiving this medication? Side effects that you should report to your care team as soon as possible: Allergic reactions--skin rash, itching, hives, swelling of the face, lips, tongue, or throat Capillary leak syndrome--stomach or muscle pain, unusual weakness or fatigue, feeling faint or lightheaded, decrease in the amount of urine, swelling of the ankles, hands, or feet, trouble breathing High white blood cell level--fever, fatigue, trouble breathing, night sweats, change in vision, weight loss Inflammation of the aorta--fever, fatigue, back, chest, or stomach pain, severe headache Kidney injury (glomerulonephritis)--decrease in the amount of urine, red or dark brown urine, foamy or bubbly urine, swelling of the ankles, hands, or feet Shortness of breath or trouble breathing Spleen injury--pain in upper left stomach or shoulder Unusual bruising or bleeding Side effects that usually do not require medical attention (report to your care team if they continue or are bothersome): Back pain Bone pain Fatigue Fever Headache Nausea This list may not describe all possible side effects. Call your doctor for medical advice about side effects. You may report side effects to FDA at 1-800-FDA-1088. Where should I keep my medication? Keep out of the reach of children and pets. Keep this medication in the original packaging until you are ready to take it. Protect from light. See product for storage information. Each product may have different instructions. Get rid of any unused medication after the expiration date. To get rid of medications that are no longer needed  or have expired: Take the medication to a medications take-back program. Check with your pharmacy or law enforcement to find a location. If you cannot return the medication, ask your pharmacist or care team how to get rid of this medication safely. NOTE: This sheet is a summary. It may not cover all possible information. If you have questions about this medicine, talk to your doctor, pharmacist, or health care provider.  2024 Elsevier/Gold Standard (2022-01-22 00:00:00)      To help prevent nausea and vomiting after your treatment, we encourage you to take your nausea medication as directed.  BELOW ARE SYMPTOMS THAT SHOULD BE REPORTED IMMEDIATELY: *FEVER GREATER THAN 100.4 F (38 C) OR HIGHER *CHILLS OR SWEATING *NAUSEA AND VOMITING THAT IS NOT CONTROLLED WITH YOUR NAUSEA MEDICATION *UNUSUAL SHORTNESS OF BREATH *UNUSUAL BRUISING OR BLEEDING *URINARY PROBLEMS (pain or burning when urinating, or frequent urination) *BOWEL PROBLEMS (unusual diarrhea, constipation, pain near the anus) TENDERNESS IN MOUTH AND THROAT WITH OR WITHOUT PRESENCE OF ULCERS (sore throat, sores in mouth, or a toothache) UNUSUAL RASH, SWELLING OR PAIN  UNUSUAL VAGINAL DISCHARGE OR ITCHING   Items with * indicate a potential emergency and should be followed up as soon as possible or go to the Emergency  Department if any problems should occur.  Please show the CHEMOTHERAPY ALERT CARD or IMMUNOTHERAPY ALERT CARD at check-in to the Emergency Department and triage nurse.  Should you have questions after your visit or need to cancel or reschedule your appointment, please contact [facility] [phone]  and follow the prompts.  Office hours are 8:00 a.m. to 4:30 p.m. Monday - Friday. Please note that voicemails left after 4:00 p.m. may not be returned until the following business day.  We are closed weekends and major holidays. You have access to a nurse at all times for urgent questions. Please call the  main number to the clinic [phone] and follow the prompts.  For any non-urgent questions, you may also contact your provider using MyChart. We now offer e-Visits for anyone 26 and older to request care online for non-urgent symptoms. For details visit [email or web address].   Also download the MyChart app! Go to the app store, search "MyChart", open the app, select [place], and log in with your MyChart username and password.

## 2023-04-15 NOTE — Progress Notes (Signed)
Patient here for a Zaixio injection. Patient tolerated injection with no complaints voiced.  Site clean and dry with no bruising or swelling noted.  No complaints of pain.  Discharged with vital signs stable and no signs or symptoms of distress noted.

## 2023-04-15 NOTE — Telephone Encounter (Signed)
Phone call to patient to review instructions for 13 hr prep for CT w/ contrast on 04/23/23 at 10:20AM. Prescription called into [facility]. Pt aware and verbalized understanding of instructions. Prescription: 9:30PM- 50mg  Prednisone 3:30AM- 50mg  Prednisone 9:30AM- 50mg  Prednisone and 50mg  Benadryl

## 2023-04-23 ENCOUNTER — Telehealth: Payer: Self-pay | Admitting: Radiation Therapy

## 2023-04-23 ENCOUNTER — Ambulatory Visit
Admission: RE | Admit: 2023-04-23 | Discharge: 2023-04-23 | Disposition: A | Discharge status: Home / Self Care | Payer: Medicare Other | Source: Ambulatory Visit | Attending: Radiation Oncology | Admitting: Radiation Oncology

## 2023-04-23 DIAGNOSIS — C7931 Secondary malignant neoplasm of brain: Secondary | ICD-10-CM

## 2023-04-23 DIAGNOSIS — C801 Malignant (primary) neoplasm, unspecified: Secondary | ICD-10-CM | POA: Diagnosis not present

## 2023-04-23 DIAGNOSIS — C7951 Secondary malignant neoplasm of bone: Secondary | ICD-10-CM | POA: Diagnosis not present

## 2023-04-23 MED ORDER — GADOPICLENOL 0.5 MMOL/ML IV SOLN
7.0000 mL | Freq: Once | INTRAVENOUS | Status: AC | PRN
  Administered 2023-04-23: 7 mL via INTRAVENOUS

## 2023-04-23 MED ORDER — HEPARIN SOD (PORK) LOCK FLUSH 100 UNIT/ML IV SOLN
500.0000 [IU] | Freq: Once | INTRAVENOUS | Status: AC
  Administered 2023-04-23: 500 [IU] via INTRAVENOUS

## 2023-04-23 MED ORDER — SODIUM CHLORIDE 0.9% FLUSH
10.0000 mL | INTRAVENOUS | Status: DC | PRN
  Administered 2023-04-23: 10 mL via INTRAVENOUS

## 2023-04-23 MED ORDER — DIPHENHYDRAMINE HCL 50 MG/ML IJ SOLN
50.0000 mg | Freq: Once | INTRAMUSCULAR | Status: AC
  Administered 2023-04-23: 50 mg via INTRAVENOUS

## 2023-04-23 NOTE — Telephone Encounter (Signed)
I received a call from [facility] stating that Denise [name] had a reaction to the IV MRI contrast, even with the 13 hour steroid premed. They have asked [place] to not scan her at [facility] going forward, for fear of a future severe reaction at an OP facility. They would like her scans done at [facility] in case an infusion of Solu-medrol is required.    This information has been shared with the patient's provider.  Jalene Mullet R.T.(R)(T) Radiation Special Procedures Navigator

## 2023-04-23 NOTE — Progress Notes (Signed)
Pt brought over to nursing area after administration of MRI contrast due to pt having hives and generalized itching. This patient did take a 13 hr prep prior to her MRI today due to history of hives with MRI contrast. Hives were generalized all over body. Pt denied any shortness of breath, difficulty breathing, tongue swelling or lip swelling. Vitals were obtained and 50 mg of benadryl was given per Dr. Charise Killian. Dr. Charise Killian saw pt and stated she should have MRIs with contrast in a hospital setting due to her reaction and taking a13 hr prep. I relayed this information to the patient, her daughter, and Jalene Mullet (radiology oncology navigator).

## 2023-04-26 ENCOUNTER — Telehealth: Payer: Self-pay | Admitting: *Deleted

## 2023-04-26 ENCOUNTER — Other Ambulatory Visit: Payer: Self-pay

## 2023-04-26 ENCOUNTER — Ambulatory Visit: Payer: Medicare Other | Admitting: Radiation Oncology

## 2023-04-26 DIAGNOSIS — C50912 Malignant neoplasm of unspecified site of left female breast: Secondary | ICD-10-CM

## 2023-04-26 DIAGNOSIS — D702 Other drug-induced agranulocytosis: Secondary | ICD-10-CM

## 2023-04-26 NOTE — Telephone Encounter (Signed)
CALLED PATIENT TO ASK ABOUT RESCHEDULING TODAY'S FU TO 05-10-23 @ 3 PM VIA TELEPHONE, SPOKE WITH PATIENT AND SHE AGREED TO TELEPHONE FU ON 05-10-23 @ 3 PM

## 2023-05-02 NOTE — Progress Notes (Signed)
[facility] [address], [place] [address]    Clinic Day:  05/03/23   Referring physician: Toma Deiters, MD  Patient Care Team: Toma Deiters, MD as PCP - General (Internal Medicine) [name], MD as Medical Oncologist (Medical Oncology)   ASSESSMENT & PLAN:   Assessment: 1.  T3N3c (stage IIIc) triple negative invasive lobular carcinoma of the left breast: - She felt lump in her left breast for more than 6 months, but thought it was secondary to fibrocystic disease which she had all her life.  When she started having pain, she reached out to Dr. Olena Leatherwood. - Ultrasound-guided left breast and left axillary lymph node biopsy on 01/15/2021 - Pathology consistent with invasive lobular carcinoma, E-cadherin negative.  ER/PR/HER2 negative.  HER2 2+ by IHC, negative by FISH.  Ki-67 is 5%.  Lymph node core biopsy was consistent with metastatic carcinoma.  Grade 2. - PET scan on 02/03/2021 showed involvement of left supraclavicular, subpectoral, axillary lymph nodes along with the breast mass.  10 mm left lung nodule which is hypometabolic.  Spinal cord lesion at T12 level with a strong uptake. - MRI of the lumbar spine with and without contrast on 02/20/2021 showed no mass or abnormal enhancement within the canal at the T12 level to correspond to the site of PET scan positive.  No marrow replacing bone lesion. - 12 weeks of carboplatin and paclitaxel weekly and every 3 weeks pembrolizumab followed by 4 cycles of dose dense AC with pembrolizumab ([id number]) from 02/27/2021 through 10/01/2021 - PET scan on 10/16/2021: Reduction in metabolic activity in the size of the left axillary lymph node and no evidence of breast hypermetabolism on PET scan. - Left mastectomy and lymph node excision by Dr. Magnus Ivan on 11/20/2021 - Pathology: 6.2 cm invasive lobular carcinoma, grade 2, margins negative.  19/21 lymph nodes involved with macrometastasis.  ypT3, YPN3A - Adjuvant  pembrolizumab started on 01/01/2022. -CREATE-X capecitabine 1500 mg twice daily 2 weeks on/1 week off started on 01/01/2022.  Last dose of pembrolizumab on 05/11/2022 - Germline mutation testing ([facility]) negative - [employer] (06/04/2022): T p53, K-ras V14 I, CDKN2A.  MSI-high not detected. - PET scan (05/28/2022): Multifocal bone lesions involving T11 vertebral body, right posterior sixth rib, left femoral neck, left scapular glenoid.  No other metastatic disease. - Sacituzumab cycle 1 on 08/18/2022.  She was hospitalized with neutropenic fever and severe diarrhea from 08/24/2022 through 08/27/2022. - CTAP (10/25/2021): Generalized mild to moderate wall thickening throughout the nondistended large bowel with faint pericolonic fat haziness compatible with nonspecific infectious/inflammatory colitis.  No free air.  C. difficile x 2 was negative. - She has UG T1 A1*28 heterozygosity. - Cycle 2 Sacituzumab dose reduced by 50%.  Discontinued secondary to poor tolerance. - Enhertu 3.2 Mg/KG cycle 1 on 11/04/2022   2.  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  She is non-smoker. - Mother died of breast cancer.  Maternal grandmother died very young in her 60s, sister has fibrocystic disease.  Father died of lung cancer and was a smoker.    Plan: 1.  Metastatic TNBC to the bones: - She has tolerated last cycle of Enhertu reasonably well. - Reviewed labs today: Normal LFTs.  Creatinine normal.  CBC with mild leukopenia with white count 2.9 and ANC of 1000. - Talked about adding long-acting G-CSF on day 3. - She may proceed with Keytruda today.  RTC 3 weeks for  follow-up.  2.  Brain lesions (SRS to 3 brain lesions on 06/16/2022): - MRI of the brain was done on 04/23/2023.  Results are pending at this time.  Will follow-up on it.  3.  Peripheral neuropathy: - Continue gabapentin 300 mg at bedtime.  Symptoms well-controlled.  4.  Hypomagnesemia: -  Continue magnesium 1 tablet daily.  Magnesium is 1.7.    Orders Placed This Encounter  Procedures   Magnesium    Standing Status:   Future    Standing Expiration Date:   05/23/2024   CBC with Differential    Standing Status:   Future    Standing Expiration Date:   05/24/2024   Comprehensive metabolic panel    Standing Status:   Future    Standing Expiration Date:   05/24/2024   Magnesium    Standing Status:   Future    Standing Expiration Date:   06/13/2024   CBC with Differential    Standing Status:   Future    Standing Expiration Date:   06/14/2024   Comprehensive metabolic panel    Standing Status:   Future    Standing Expiration Date:   06/14/2024        Denise [name],acting as a scribe for [name], MD.,have documented all relevant documentation on the behalf of [name], MD,as directed by  [name], MD while in the presence of [name], MD.  I, [name] MD, have reviewed the above documentation for accuracy and completeness, and I agree with the above.      [name], MD   8/19/20244:25 PM  CHIEF COMPLAINT:   Diagnosis: Metastatic triple negative breast cancer to the bones    Cancer Staging  Invasive lobular carcinoma of left breast in female [facility]) Staging form: Breast, AJCC 8th Edition - Clinical stage from 01/23/2021: cT3, cN3c, G2, ER-, PR-, HER2- - Unsigned - Pathologic stage from 12/22/2021: No Stage Recommended (ypT3, pN3a, cM0, G2, ER-, PR-, HER2-) - Signed by [name], MD on 12/22/2021    Prior Therapy: 1.  Neoadjuvant chemotherapy with weekly carboplatin and paclitaxel and dose dense AC with pembrolizumab 2. Left mastectomy and lymph node excision by Dr. Magnus Ivan on 11/20/2021 3.  Sacituzumab on 08/18/2022 and 10/08/2021, discontinued due to poor tolerance.  Current Therapy:  Enhertu every 21 days    HISTORY OF PRESENT ILLNESS:   Oncology History  Invasive lobular carcinoma  of left breast in female [facility])  01/23/2021 Initial Diagnosis   Invasive lobular carcinoma of left breast in female [facility])   02/19/2021 Genetic Testing   Negative genetic testing on the CancerNext-Expanded+RNAinsight panel.  SMARCB1 VUS identified.  The CancerNext-Expanded gene panel offered by [facility] and includes sequencing and rearrangement analysis for the following 77 genes: AIP, ALK, APC*, ATM*, AXIN2, BAP1, BARD1, BLM, BMPR1A, BRCA1*, BRCA2*, BRIP1*, CDC73, CDH1*, CDK4, CDKN1B, CDKN2A, CHEK2*, CTNNA1, DICER1, FANCC, FH, FLCN, GALNT12, KIF1B, LZTR1, MAX, MEN1, MET, MLH1*, MSH2*, MSH3, MSH6*, MUTYH*, NBN, NF1*, NF2, NTHL1, PALB2*, PHOX2B, PMS2*, POT1, PRKAR1A, PTCH1, PTEN*, RAD51C*, RAD51D*, RB1, RECQL, RET, SDHA, SDHAF2, SDHB, SDHC, SDHD, SMAD4, SMARCA4, SMARCB1, SMARCE1, STK11, SUFU, TMEM127, TP53*, TSC1, TSC2, VHL and XRCC2 (sequencing and deletion/duplication); EGFR, EGLN1, HOXB13, KIT, MITF, PDGFRA, POLD1, and POLE (sequencing only); EPCAM and GREM1 (deletion/duplication only). DNA and RNA analyses performed for * genes. The report date is February 19, 2021.   02/27/2021 - 05/11/2022 Chemotherapy   Patient is on Treatment Plan : BREAST Pembrolizumab + Carboplatin D1,8,15+ Paclitaxel D1,8,15 q21d X 4 cycles /  Pembrolizumab + AC q21d x 4 cycles     12/22/2021 Cancer Staging   Staging form: Breast, AJCC 8th Edition - Pathologic stage from 12/22/2021: No Stage Recommended (ypT3, pN3a, cM0, G2, ER-, PR-, HER2-) - Signed by [name], MD on 12/22/2021 Histopathologic type: Lobular carcinoma, NOS Stage prefix: Post-therapy Method of lymph node assessment: Axillary lymph node dissection Multigene prognostic tests performed: None Histologic grading system: 3 grade system   08/18/2022 - 10/08/2022 Chemotherapy   Patient is on Treatment Plan : BREAST METASTATIC Sacituzumab govitecan-hziy Drinda Butts) D1,8 q21d     11/04/2022 -  Chemotherapy   Patient is on Treatment Plan : BREAST METASTATIC  Fam-Trastuzumab Deruxtecan-nxki (Enhertu) (5.4) q21d        INTERVAL HISTORY:   Denise [name] is a 72 y.o. female presenting to clinic today for follow up of Metastatic triple negative breast cancer to the bones. She was last seen by me on 04/13/23.  Today, she states that she is doing well overall. Her appetite level is at 100%. Her energy level is at 50%.   PAST MEDICAL HISTORY:   Past Medical History: Past Medical History:  Diagnosis Date   Asthma    as child   Cancer (HCC) 12/2020   left breast IMC   Complication of anesthesia    pateitn states,' I coded when I had my D&Cmany years ago.   Dyspnea    Family history of breast cancer    Hypertension    Hypothyroidism    PONV (postoperative nausea and vomiting)    Port-A-Cath in place 02/26/2021    Surgical History: Past Surgical History:  Procedure Laterality Date   ABDOMINAL HYSTERECTOMY     BIOPSY  01/17/2018   Procedure: BIOPSY;  Surgeon: Malissa Hippo, MD;  [facility];  Service: Endoscopy;;  duodenum,gastric   CHOLECYSTECTOMY     COLONOSCOPY WITH PROPOFOL N/A 01/17/2018   Procedure: COLONOSCOPY WITH PROPOFOL;  Surgeon: Malissa Hippo, MD;  [facility]: [facility];  Service: Endoscopy;  Laterality: N/A;  7:30   DILATION AND CURETTAGE OF UTERUS     ESOPHAGOGASTRODUODENOSCOPY (EGD) WITH PROPOFOL N/A 01/17/2018   Procedure: ESOPHAGOGASTRODUODENOSCOPY (EGD) WITH PROPOFOL;  Surgeon: Malissa Hippo, MD;  [facility];  Service: Endoscopy;  Laterality: N/A;   POLYPECTOMY  01/17/2018   Procedure: POLYPECTOMY;  Surgeon: Malissa Hippo, MD;  [facility];  Service: Endoscopy;;  transverse colon, cecal   PORTACATH PLACEMENT Right 02/17/2021   Procedure: INSERTION PORT-A-CATH;  Surgeon: Abigail Miyamoto, MD;  Location: [facility];  Service: General;  Laterality: Right;   RADIOACTIVE SEED GUIDED AXILLARY SENTINEL LYMPH NODE Left 11/20/2021   Procedure: RADIOACTIVE SEED GUIDED LEFT  AXILLARY SENTINEL LYMPH NODE DISSECTION;  Surgeon: Abigail Miyamoto, MD;  Location: [facility];  Service: General;  Laterality: Left;   TOTAL MASTECTOMY Left 11/20/2021   Procedure: LEFT TOTAL MASTECTOMY;  Surgeon: Abigail Miyamoto, MD;  Location: [facility];  Service: General;  Laterality: Left;    Social History: Social History   Socioeconomic History   Marital status: Widowed    Spouse name: Not on file   Number of children: 3   Years of education: Not on file   Highest education level: Not on file  Occupational History   Occupation: [employer]   Occupation: retired  Tobacco Use   Smoking status: Never   Smokeless tobacco: Never  Vaping Use   Vaping status: Never Used  Substance and Sexual  Activity   Alcohol use: Never   Drug use: Never   Sexual activity: Not Currently    Birth control/protection: Surgical  Other Topics Concern   Not on file  Social History Narrative   Not on file   Social Determinants of Health   Financial Resource Strain: Low Risk  (07/10/2022)   Overall Financial Resource Strain (CARDIA)    Difficulty of Paying Living Expenses: Not hard at all  Food Insecurity: No Food Insecurity (06/30/2022)   Hunger Vital Sign    Worried About Running Out of Food in the Last Year: Never true    Ran Out of Food in the Last Year: Never true  Transportation Needs: No Transportation Needs (07/10/2022)   PRAPARE - Administrator, Civil Service (Medical): No    Lack of Transportation (Non-Medical): No  Physical Activity: Insufficiently Active (01/22/2021)   Exercise Vital Sign    Days of Exercise per Week: 3 days    Minutes of Exercise per Session: 30 min  Stress: No Stress Concern Present (01/22/2021)   [employer] of Occupational Health - Occupational Stress Questionnaire    Feeling of Stress : Not at all  Social Connections: Moderately Integrated (01/22/2021)   Social Connection and Isolation Panel  [NHANES]    Frequency of Communication with Friends and Family: More than three times a week    Frequency of Social Gatherings with Friends and Family: More than three times a week    Attends Religious Services: More than 4 times per year    Active Member of [employer] or Organizations: No    Attends Engineer, structural: More than 4 times per year    Marital Status: Widowed  Intimate Partner Violence: Not At Risk (05/28/2022)   Humiliation, Afraid, Rape, and Kick questionnaire    Fear of Current or Ex-Partner: No    Emotionally Abused: No    Physically Abused: No    Sexually Abused: No    Family History: Family History  Problem Relation Age of Onset   Breast cancer Mother        dx in her 3s   Heart disease Mother    Thyroid disease Mother    Lung cancer Father        dx in his 88s   Thyroid disease Maternal Aunt    Thyroid nodules Maternal Grandmother 35       goiter   Thyroid disease Maternal Grandmother    Heart disease Maternal Grandfather    Heart disease Paternal Grandmother    Heart disease Paternal Grandfather    Thyroid disease Daughter    Thyroid disease Daughter    Cancer Maternal Uncle        NOS   Cancer Paternal Uncle        NOS   Breast cancer Cousin        pat first cousin died in her 49s;     Current Medications:  Current Outpatient Medications:    Acetaminophen (TYLENOL PO), Take 1 tablet by mouth as needed., Disp: , Rfl:    gabapentin (NEURONTIN) 300 MG capsule, Take 1 capsule by mouth 2 (two) times daily., Disp: , Rfl:    hydrALAZINE (APRESOLINE) 25 MG tablet, Take 25 mg by mouth 3 (three) times daily., Disp: , Rfl:    Lactulose 20 GM/30ML SOLN, Take 15 mLs (10 g total) by mouth at bedtime as needed., Disp: 450 mL, Rfl: 0   levothyroxine (SYNTHROID) 75 MCG tablet, Take 75 mcg by mouth  daily before breakfast., Disp: , Rfl:    lidocaine (XYLOCAINE) 2 % solution, Use as directed 15 mLs in the mouth or throat as needed (reflux)., Disp: 100 mL,  Rfl: 2   Melatonin 5 MG TABS, Take 10 mg by mouth. As needed for sleep, Disp: , Rfl:    metoCLOPramide (REGLAN) 10 MG tablet, Take 1 tablet (10 mg total) by mouth every 6 (six) hours as needed for nausea or vomiting., Disp: 30 tablet, Rfl: 0   midodrine (PROAMATINE) 10 MG tablet, Take 10 mg by mouth daily., Disp: , Rfl:    olmesartan-hydrochlorothiazide (BENICAR HCT) 40-25 MG tablet, Take 1 tablet by mouth daily., Disp: , Rfl:    pantoprazole (PROTONIX) 40 MG tablet, Take 1 tablet (40 mg total) by mouth daily., Disp: 60 tablet, Rfl: 3   promethazine (PHENERGAN) 25 MG suppository, Place 1 suppository (25 mg total) rectally every 6 (six) hours as needed for nausea or vomiting., Disp: 12 each, Rfl: 0   sucralfate (CARAFATE) 1 g tablet, Take 1 tablet (1 g total) by mouth every 4 (four) hours as needed., Disp: 180 tablet, Rfl: 0 No current facility-administered medications for this visit.  Facility-Administered Medications Ordered in Other Visits:    sodium chloride flush (NS) 0.9 % injection 10 mL, 10 mL, Intracatheter, PRN, [name], MD, 10 mL at 05/03/23 1604   Allergies: Allergies  Allergen Reactions   Exforge [Amlodipine Besylate-Valsartan] Nausea And Vomiting   Gadolinium Derivatives Anaphylaxis, Hives and Itching    Pt should not have MRI contrast in outpt facility. Pt developed hives and itching AFTER taking 13 hr prep. Per Dr. Charise Killian. 04/23/23   Gadopiclenol Hives and Itching    13 hr prep prior to contrast admin    Tape Other (See Comments)    Redness and Irriatation   Cheese     Hard cheese   Levofloxacin In D5w Hives   Other    Proanthocyanidin    Strawberry Extract Diarrhea    Seeds,nuts, lettuce, grapes   Wild Lettuce Extract (Lactuca Virosa)    Yeast-Related Products Hives    Mold on bread    REVIEW OF SYSTEMS:   Review of Systems  Constitutional:  Negative for chills, fatigue and fever.  HENT:   Negative for lump/mass, mouth sores, nosebleeds, sore throat  and trouble swallowing.   Eyes:  Negative for eye problems.  Respiratory:  Negative for cough and shortness of breath.   Cardiovascular:  Positive for chest pain. Negative for leg swelling and palpitations.  Gastrointestinal:  Positive for nausea. Negative for abdominal pain, constipation, diarrhea and vomiting.  Genitourinary:  Negative for bladder incontinence, difficulty urinating, dysuria, frequency, hematuria and nocturia.   Musculoskeletal:  Negative for arthralgias, back pain, flank pain, myalgias and neck pain.  Skin:  Negative for itching and rash.  Neurological:  Positive for headaches. Negative for dizziness and numbness.  Hematological:  Does not bruise/bleed easily.  Psychiatric/Behavioral:  Negative for depression, sleep disturbance and suicidal ideas. The patient is not nervous/anxious.   All other systems reviewed and are negative.    VITALS:   There were no vitals taken for this visit.  Wt Readings from Last 3 Encounters:  05/03/23 163 lb (73.9 kg)  04/13/23 162 lb 12.8 oz (73.8 kg)  04/01/23 153 lb 12.8 oz (69.8 kg)    There is no height or weight on file to calculate BMI.  Performance status (ECOG): 1 - Symptomatic but completely ambulatory  PHYSICAL EXAM:   Physical Exam Vitals and  nursing note reviewed. Exam conducted with a chaperone present.  Constitutional:      Appearance: Normal appearance.  Cardiovascular:     Rate and Rhythm: Normal rate and regular rhythm.     Pulses: Normal pulses.     Heart sounds: Normal heart sounds.  Pulmonary:     Effort: Pulmonary effort is normal.     Breath sounds: Normal breath sounds.  Abdominal:     Palpations: Abdomen is soft. There is no hepatomegaly, splenomegaly or mass.     Tenderness: There is no abdominal tenderness.  Musculoskeletal:     Right lower leg: No edema.     Left lower leg: No edema.  Lymphadenopathy:     Cervical: No cervical adenopathy.     Right cervical: No superficial, deep or posterior  cervical adenopathy.    Left cervical: No superficial, deep or posterior cervical adenopathy.     Upper Body:     Right upper body: No supraclavicular or axillary adenopathy.     Left upper body: No supraclavicular or axillary adenopathy.  Neurological:     General: No focal deficit present.     Mental Status: She is alert and oriented to person, place, and time.  Psychiatric:        Mood and Affect: Mood normal.        Behavior: Behavior normal.     LABS:      Latest Ref Rng & Units 05/03/2023   12:15 PM 04/12/2023    1:28 PM 03/29/2023   10:14 AM  CBC  WBC 4.0 - 10.5 K/uL 2.9  2.9  2.2   Hemoglobin 12.0 - 15.0 g/dL 13.0  86.5  78.4   Hematocrit 36.0 - 46.0 % 32.6  31.7  32.5   Platelets 150 - 400 K/uL 211  185  156       Latest Ref Rng & Units 05/03/2023   12:15 PM 04/12/2023    1:28 PM 03/29/2023   10:14 AM  CMP  Glucose 70 - 99 mg/dL 696  295  82   BUN 8 - 23 mg/dL 16  22  17    Creatinine 0.44 - 1.00 mg/dL 2.84  1.32  4.40   Sodium 135 - 145 mmol/L 134  134  135   Potassium 3.5 - 5.1 mmol/L 3.6  3.8  3.6   Chloride 98 - 111 mmol/L 103  104  105   CO2 22 - 32 mmol/L 23  24  20    Calcium 8.9 - 10.3 mg/dL 8.4  8.2  8.7   Total Protein 6.5 - 8.1 g/dL 6.7  6.7  6.7   Total Bilirubin 0.3 - 1.2 mg/dL 0.3  0.5  0.8   Alkaline Phos 38 - 126 U/L 104  126  157   AST 15 - 41 U/L 16  23  28    ALT 0 - 44 U/L 13  18  28       No results found for: "CEA1", "CEA" / No results found for: "CEA1", "CEA" No results found for: "PSA1" No results found for: "NUU725" No results found for: "CAN125"  No results found for: "TOTALPROTELP", "ALBUMINELP", "A1GS", "A2GS", "BETS", "BETA2SER", "GAMS", "MSPIKE", "SPEI" No results found for: "TIBC", "FERRITIN", "IRONPCTSAT" No results found for: "LDH"   STUDIES:   No results found.

## 2023-05-03 ENCOUNTER — Inpatient Hospital Stay: Payer: Medicare Other

## 2023-05-03 ENCOUNTER — Inpatient Hospital Stay: Payer: Medicare Other | Admitting: Hematology

## 2023-05-03 VITALS — BP 121/61 | HR 58 | Temp 96.6°F | Resp 18

## 2023-05-03 DIAGNOSIS — Z95828 Presence of other vascular implants and grafts: Secondary | ICD-10-CM | POA: Diagnosis not present

## 2023-05-03 DIAGNOSIS — Z5189 Encounter for other specified aftercare: Secondary | ICD-10-CM | POA: Diagnosis not present

## 2023-05-03 DIAGNOSIS — C50912 Malignant neoplasm of unspecified site of left female breast: Secondary | ICD-10-CM

## 2023-05-03 DIAGNOSIS — C7931 Secondary malignant neoplasm of brain: Secondary | ICD-10-CM | POA: Diagnosis not present

## 2023-05-03 DIAGNOSIS — C7951 Secondary malignant neoplasm of bone: Secondary | ICD-10-CM | POA: Diagnosis not present

## 2023-05-03 DIAGNOSIS — Z5112 Encounter for antineoplastic immunotherapy: Secondary | ICD-10-CM | POA: Diagnosis not present

## 2023-05-03 DIAGNOSIS — D702 Other drug-induced agranulocytosis: Secondary | ICD-10-CM

## 2023-05-03 DIAGNOSIS — Z171 Estrogen receptor negative status [ER-]: Secondary | ICD-10-CM | POA: Diagnosis not present

## 2023-05-03 LAB — COMPREHENSIVE METABOLIC PANEL
ALT: 13 U/L (ref 0–44)
AST: 16 U/L (ref 15–41)
Albumin: 3.3 g/dL — ABNORMAL LOW (ref 3.5–5.0)
Alkaline Phosphatase: 104 U/L (ref 38–126)
Anion gap: 8 (ref 5–15)
BUN: 16 mg/dL (ref 8–23)
CO2: 23 mmol/L (ref 22–32)
Calcium: 8.4 mg/dL — ABNORMAL LOW (ref 8.9–10.3)
Chloride: 103 mmol/L (ref 98–111)
Creatinine, Ser: 0.75 mg/dL (ref 0.44–1.00)
GFR, Estimated: >60 mL/min (ref >60)
Glucose, Bld: 113 mg/dL — ABNORMAL HIGH (ref 70–99)
Potassium: 3.6 mmol/L (ref 3.5–5.1)
Sodium: 134 mmol/L — ABNORMAL LOW (ref 135–145)
Total Bilirubin: 0.3 mg/dL (ref 0.3–1.2)
Total Protein: 6.7 g/dL (ref 6.5–8.1)

## 2023-05-03 LAB — CBC WITH DIFFERENTIAL/PLATELET
Abs Immature Granulocytes: 0 10*3/uL (ref 0.00–0.07)
Basophils Absolute: 0 10*3/uL (ref 0.0–0.1)
Basophils Relative: 0 %
Eosinophils Absolute: 0.1 10*3/uL (ref 0.0–0.5)
Eosinophils Relative: 3 %
HCT: 32.6 % — ABNORMAL LOW (ref 36.0–46.0)
Hemoglobin: 10.7 g/dL — ABNORMAL LOW (ref 12.0–15.0)
Lymphocytes Relative: 44 %
Lymphs Abs: 1.3 10*3/uL (ref 0.7–4.0)
MCH: 35.8 pg — ABNORMAL HIGH (ref 26.0–34.0)
MCHC: 32.8 g/dL (ref 30.0–36.0)
MCV: 109 fL — ABNORMAL HIGH (ref 80.0–100.0)
Monocytes Absolute: 0.5 10*3/uL (ref 0.1–1.0)
Monocytes Relative: 17 %
Neutro Abs: 1 10*3/uL — ABNORMAL LOW (ref 1.7–7.7)
Neutrophils Relative %: 36 %
Platelets: 211 10*3/uL (ref 150–400)
RBC: 2.99 MIL/uL — ABNORMAL LOW (ref 3.87–5.11)
RDW: 13.8 % (ref 11.5–15.5)
WBC: 2.9 10*3/uL — ABNORMAL LOW (ref 4.0–10.5)
nRBC: 0 % (ref 0.0–0.2)

## 2023-05-03 LAB — MAGNESIUM: Magnesium: 1.7 mg/dL (ref 1.7–2.4)

## 2023-05-03 MED ORDER — FAM-TRASTUZUMAB DERUXTECAN-NXKI CHEMO 100 MG IV SOLR
4.2000 mg/kg | Freq: Once | INTRAVENOUS | Status: AC
  Administered 2023-05-03: 300 mg via INTRAVENOUS
  Filled 2023-05-03: qty 15

## 2023-05-03 MED ORDER — HEPARIN SOD (PORK) LOCK FLUSH 100 UNIT/ML IV SOLN
500.0000 [IU] | Freq: Once | INTRAVENOUS | Status: AC | PRN
  Administered 2023-05-03: 500 [IU]

## 2023-05-03 MED ORDER — SODIUM CHLORIDE 0.9% FLUSH
10.0000 mL | Freq: Once | INTRAVENOUS | Status: AC
  Administered 2023-05-03: 10 mL via INTRAVENOUS

## 2023-05-03 MED ORDER — DEXTROSE 5 % IV SOLN: Freq: Once | INTRAVENOUS | Status: AC

## 2023-05-03 MED ORDER — PALONOSETRON HCL INJECTION 0.25 MG/5ML
0.2500 mg | Freq: Once | INTRAVENOUS | Status: AC
  Administered 2023-05-03: 0.25 mg via INTRAVENOUS
  Filled 2023-05-03: qty 5

## 2023-05-03 MED ORDER — ACETAMINOPHEN 325 MG PO TABS
650.0000 mg | ORAL_TABLET | Freq: Once | ORAL | Status: AC
  Administered 2023-05-03: 650 mg via ORAL
  Filled 2023-05-03: qty 2

## 2023-05-03 MED ORDER — SODIUM CHLORIDE 0.9% FLUSH
10.0000 mL | INTRAVENOUS | Status: DC | PRN
  Administered 2023-05-03: 10 mL

## 2023-05-03 MED ORDER — SODIUM CHLORIDE 0.9 % IV SOLN
10.0000 mg | Freq: Once | INTRAVENOUS | Status: AC
  Administered 2023-05-03: 10 mg via INTRAVENOUS
  Filled 2023-05-03: qty 10

## 2023-05-03 MED ORDER — SODIUM CHLORIDE 0.9 % IV SOLN
150.0000 mg | Freq: Once | INTRAVENOUS | Status: AC
  Administered 2023-05-03: 150 mg via INTRAVENOUS
  Filled 2023-05-03: qty 150

## 2023-05-03 MED ORDER — CETIRIZINE HCL 10 MG/ML IV SOLN
10.0000 mg | Freq: Once | INTRAVENOUS | Status: AC
  Administered 2023-05-03: 10 mg via INTRAVENOUS
  Filled 2023-05-03: qty 1

## 2023-05-03 NOTE — Progress Notes (Signed)
Patient has been examined by Dr. Ellin Saba. Vital signs and labs have been reviewed by MD - ANC (1.0), Creatinine, LFTs, hemoglobin, and platelets are within treatment parameters per M.D. - pt may proceed with treatment. Will add long-acting GCSF to plan per MD. Primary RN and pharmacy notified.

## 2023-05-03 NOTE — Progress Notes (Signed)
Patients port flushed without difficulty.  Good blood return noted with no bruising or swelling noted at site.  Stable during access and blood draw.  Patient to remain accessed for treatment. 

## 2023-05-03 NOTE — Patient Instructions (Signed)
[facility]  Discharge Instructions: Thank you for choosing [facility] to provide your oncology and hematology care.  If you have a lab appointment with the Cancer Center - please note that after April 8th, 2024, all labs will be drawn in the cancer center.  You do not have to check in or register with the main entrance as you have in the past but will complete your check-in in the cancer center.  Wear comfortable clothing and clothing appropriate for easy access to any Portacath or PICC line.   We strive to give you quality time with your provider. You may need to reschedule your appointment if you arrive late (15 or more minutes).  Arriving late affects you and other patients whose appointments are after yours.  Also, if you miss three or more appointments without notifying the office, you may be dismissed from the clinic at the provider's discretion.      For prescription refill requests, have your pharmacy contact our office and allow 72 hours for refills to be completed.    To    To help prevent nausea and vomiting after your treatment, we encourage you to take your nausea medication as directed.  BELOW ARE SYMPTOMS THAT SHOULD BE REPORTED IMMEDIATELY: *FEVER GREATER THAN 100.4 F (38 C) OR HIGHER *CHILLS OR SWEATING *NAUSEA AND VOMITING THAT IS NOT CONTROLLED WITH YOUR NAUSEA MEDICATION *UNUSUAL SHORTNESS OF BREATH *UNUSUAL BRUISING OR BLEEDING *URINARY PROBLEMS (pain or burning when urinating, or frequent urination) *BOWEL PROBLEMS (unusual diarrhea, constipation, pain near the anus) TENDERNESS IN MOUTH AND THROAT WITH OR WITHOUT PRESENCE OF ULCERS (sore throat, sores in mouth, or a toothache) UNUSUAL RASH, SWELLING OR PAIN  UNUSUAL VAGINAL DISCHARGE OR ITCHING   Items with * indicate a potential emergency and should be followed up as soon as possible or go to the Emergency Department if any problems should occur.  Please show the CHEMOTHERAPY  ALERT CARD or IMMUNOTHERAPY ALERT CARD at check-in to the Emergency Department and triage nurse.  Should you have questions after your visit or need to cancel or reschedule your appointment, please contact [facility] [phone]  and follow the prompts.  Office hours are 8:00 a.m. to 4:30 p.m. Monday - Friday. Please note that voicemails left after 4:00 p.m. may not be returned until the following business day.  We are closed weekends and major holidays. You have access to a nurse at all times for urgent questions. Please call the main number to the clinic [phone] and follow the prompts.  For any non-urgent questions, you may also contact your provider using MyChart. We now offer e-Visits for anyone 35 and older to request care online for non-urgent symptoms. For details visit [email or web address].   Also download the MyChart app! Go to the app store, search "MyChart", open the app, select [facility], and log in with your MyChart username and password.

## 2023-05-03 NOTE — Patient Instructions (Signed)
[facility] at [facility] Discharge Instructions   You were seen and examined today by Dr. Ellin Saba.  He reviewed the results of your lab work which are mostly normal/stable. Your absolute neutrophil count is low today at 1,000. We will plan to give you a long-acting white blood cell booster shot 2 days after your treatment.   We will proceed with your treatment today.   Return as scheduled.    Thank you for choosing [facility] at [facility] to provide your oncology and hematology care.  To afford each patient quality time with our provider, please arrive at least 15 minutes before your scheduled appointment time.   If you have a lab appointment with the Cancer Center please come in thru the Main Entrance and check in at the main information desk.  You need to re-schedule your appointment should you arrive 10 or more minutes late.  We strive to give you quality time with our providers, and arriving late affects you and other patients whose appointments are after yours.  Also, if you no show three or more times for appointments you may be dismissed from the clinic at the providers discretion.     Again, thank you for choosing [facility].  Our hope is that these requests will decrease the amount of time that you wait before being seen by our physicians.       _____________________________________________________________  Should you have questions after your visit to [facility], please contact our office at [phone] and follow the prompts.  Our office hours are 8:00 a.m. and 4:30 p.m. Monday - Friday.  Please note that voicemails left after 4:00 p.m. may not be returned until the following business day.  We are closed weekends and major holidays.  You do have access to a nurse 24-7, just call the main number to the clinic [phone] and do not press any options, hold on the line and a nurse will answer the phone.     For prescription refill requests, have your pharmacy contact our office and allow 72 hours.    Due to Covid, you will need to wear a mask upon entering the hospital. If you do not have a mask, a mask will be given to you at the Main Entrance upon arrival. For doctor visits, patients may have 1 support person age 23 or older with them. For treatment visits, patients can not have anyone with them due to social distancing guidelines and our immunocompromised population.

## 2023-05-03 NOTE — Progress Notes (Signed)

## 2023-05-04 ENCOUNTER — Other Ambulatory Visit: Payer: Self-pay

## 2023-05-05 ENCOUNTER — Inpatient Hospital Stay: Payer: Medicare Other

## 2023-05-05 VITALS — BP 117/55 | HR 75 | Temp 97.8°F | Resp 16

## 2023-05-05 DIAGNOSIS — Z5189 Encounter for other specified aftercare: Secondary | ICD-10-CM | POA: Diagnosis not present

## 2023-05-05 DIAGNOSIS — Z171 Estrogen receptor negative status [ER-]: Secondary | ICD-10-CM | POA: Diagnosis not present

## 2023-05-05 DIAGNOSIS — Z95828 Presence of other vascular implants and grafts: Secondary | ICD-10-CM

## 2023-05-05 DIAGNOSIS — C50912 Malignant neoplasm of unspecified site of left female breast: Secondary | ICD-10-CM | POA: Diagnosis not present

## 2023-05-05 DIAGNOSIS — Z5112 Encounter for antineoplastic immunotherapy: Secondary | ICD-10-CM | POA: Diagnosis not present

## 2023-05-05 DIAGNOSIS — C7931 Secondary malignant neoplasm of brain: Secondary | ICD-10-CM | POA: Diagnosis not present

## 2023-05-05 DIAGNOSIS — C7951 Secondary malignant neoplasm of bone: Secondary | ICD-10-CM | POA: Diagnosis not present

## 2023-05-05 MED ORDER — PEGFILGRASTIM-FPGK 6 MG/0.6ML ~~LOC~~ SOSY
6.0000 mg | PREFILLED_SYRINGE | Freq: Once | SUBCUTANEOUS | Status: AC
  Administered 2023-05-05: 6 mg via SUBCUTANEOUS
  Filled 2023-05-05: qty 0.6

## 2023-05-05 NOTE — Patient Instructions (Addendum)
[facility]  Discharge Instructions: Thank you for choosing [facility] to provide your oncology and hematology care.  If you have a lab appointment with the Cancer Center - please note that after April 8th, 2024, all labs will be drawn in the cancer center.  You do not have to check in or register with the main entrance as you have in the past but will complete your check-in in the cancer center.  Wear comfortable clothing and clothing appropriate for easy access to any Portacath or PICC line.   We strive to give you quality time with your provider. You may need to reschedule your appointment if you arrive late (15 or more minutes).  Arriving late affects you and other patients whose appointments are after yours.  Also, if you miss three or more appointments without notifying the office, you may be dismissed from the clinic at the provider's discretion.      For prescription refill requests, have your pharmacy contact our office and allow 72 hours for refills to be completed.    Today you received the following injection-stimufend   To help prevent nausea and vomiting after your treatment, we encourage you to take your nausea medication as directed.  BELOW ARE SYMPTOMS THAT SHOULD BE REPORTED IMMEDIATELY: *FEVER GREATER THAN 100.4 F (38 C) OR HIGHER *CHILLS OR SWEATING *NAUSEA AND VOMITING THAT IS NOT CONTROLLED WITH YOUR NAUSEA MEDICATION *UNUSUAL SHORTNESS OF BREATH *UNUSUAL BRUISING OR BLEEDING *URINARY PROBLEMS (pain or burning when urinating, or frequent urination) *BOWEL PROBLEMS (unusual diarrhea, constipation, pain near the anus) TENDERNESS IN MOUTH AND THROAT WITH OR WITHOUT PRESENCE OF ULCERS (sore throat, sores in mouth, or a toothache) UNUSUAL RASH, SWELLING OR PAIN  UNUSUAL VAGINAL DISCHARGE OR ITCHING   Items with * indicate a potential emergency and should be followed up as soon as possible or go to the Emergency Department if any  problems should occur.  Please show the CHEMOTHERAPY ALERT CARD or IMMUNOTHERAPY ALERT CARD at check-in to the Emergency Department and triage nurse.  Should you have questions after your visit or need to cancel or reschedule your appointment, please contact [facility] CENTER AT [address] [facility] [phone]  and follow the prompts.  Office hours are 8:00 a.m. to 4:30 p.m. Monday - Friday. Please note that voicemails left after 4:00 p.m. may not be returned until the following business day.  We are closed weekends and major holidays. You have access to a nurse at all times for urgent questions. Please call the main number to the clinic [phone] and follow the prompts.  For any non-urgent questions, you may also contact your provider using MyChart. We now offer e-Visits for anyone 13 and older to request care online for non-urgent symptoms. For details visit [email or web address].   Also download the MyChart app! Go to the app store, search "MyChart", open the app, select [place], and log in with your MyChart username and password.

## 2023-05-05 NOTE — Progress Notes (Signed)
Stimufend injection given per orders. Patient tolerated it well without problems. Vitals stable and discharged home from clinic ambulatory. Follow up as scheduled.

## 2023-05-10 ENCOUNTER — Ambulatory Visit
Admission: RE | Admit: 2023-05-10 | Discharge: 2023-05-10 | Disposition: A | Discharge status: Home / Self Care | Payer: Medicare Other | Source: Ambulatory Visit | Attending: Radiation Oncology | Admitting: Radiation Oncology

## 2023-05-10 ENCOUNTER — Encounter: Payer: Self-pay | Admitting: Radiation Oncology

## 2023-05-10 DIAGNOSIS — C7951 Secondary malignant neoplasm of bone: Secondary | ICD-10-CM | POA: Diagnosis not present

## 2023-05-10 DIAGNOSIS — C50912 Malignant neoplasm of unspecified site of left female breast: Secondary | ICD-10-CM

## 2023-05-10 DIAGNOSIS — C7931 Secondary malignant neoplasm of brain: Secondary | ICD-10-CM

## 2023-05-10 NOTE — Progress Notes (Signed)
Radiation Oncology         [phone] ________________________________  Outpatient Follow Up- Conducted via telephone at patient request.  I spoke with the patient to conduct this visit via telephone. The patient was notified in advance and was offered an in person or telemedicine meeting to allow for face to face communication but instead preferred to proceed with a telephone visit.  Name: Denise [name]        MRN: [id number]  Date of Service: 05/10/2023 DOB: [birth year]  [id number], Denise Gianotti, MD  Denise Deiters, MD     REFERRING PHYSICIAN: Toma Deiters, MD   DIAGNOSIS: {There were no encounter diagnoses. (Refresh or delete this SmartLink)}   HISTORY OF PRESENT ILLNESS: Denise [name] is a 72 y.o. female seen at the request of Dr. Ellin [name] for a new diagnosis of metastatic brain disease from her history of breast cancer.  The patient has a history of stage IIIC, cT3N3c triple negative invasive lobular carcinoma of the left breast for which she underwent neoadjuvant chemo and immunotherapy beginning in June 2022 through January 2023.  She remained on Keytruda with her last dose and consolidation being on 05/11/2022.    She was found to have progressive disease in the brain and multiple bones. She received single fraction SRS to three lesions on 06/16/22, but during her 3T MRI was found to have a punctate area of enhancement in the right lentiform nucleas. It was recommended she have a repeat MRI 1 month later. She also went on to receive a palliative course of radiation to the left chest wall along the scapula, T11, and proximal femur. She completed the palliative radiation as well without difficulty.   She has been followed with MRI imaging for surveillance of brain disease. She is also working with Dr. Ellin [name] and has plans to start Enhertu. She had an MRI of the brain on 10/23/22 showed stable to slight increase in the two right frontoparietal lesions, and decreased visibility  (per radiology in brain oncology conference no longer visible) in the cerebellar vermis. She's contacted by phone to review these results.   PREVIOUS RADIATION THERAPY:    06/18/22-07/09/22: The left chest including scapula, T11 spine, and proximal left femur were treated to 37.5 Gy in 15 fractions  06/16/22:   SRS Treatment: The following sites were treated to 20 Gy in 1 fraction PTV_1_FrontR_90mm PTV_2_FrontR_29mm  PTV_3_Vermis_58mm   PAST MEDICAL HISTORY:  Past Medical History:  Diagnosis Date   Asthma    as child   Cancer (HCC) 12/2020   left breast IMC   Complication of anesthesia    pateitn states,' I coded when I had my D&Cmany years ago.   Dyspnea    Family history of breast cancer    Hypertension    Hypothyroidism    PONV (postoperative nausea and vomiting)    Port-A-Cath in place 02/26/2021       PAST SURGICAL HISTORY: Past Surgical History:  Procedure Laterality Date   ABDOMINAL HYSTERECTOMY     BIOPSY  01/17/2018   Procedure: BIOPSY;  Surgeon: Denise Hippo, MD;  [facility];  Service: Endoscopy;;  duodenum,gastric   CHOLECYSTECTOMY     COLONOSCOPY WITH PROPOFOL N/A 01/17/2018   Procedure: COLONOSCOPY WITH PROPOFOL;  Surgeon: Denise Hippo, MD;  [facility];  Service: Endoscopy;  Laterality: N/A;  7:30   DILATION AND CURETTAGE OF UTERUS     ESOPHAGOGASTRODUODENOSCOPY (EGD) WITH PROPOFOL N/A 01/17/2018   Procedure: ESOPHAGOGASTRODUODENOSCOPY (  EGD) WITH PROPOFOL;  Surgeon: Denise Hippo, MD;  [facility];  Service: Endoscopy;  Laterality: N/A;   POLYPECTOMY  01/17/2018   Procedure: POLYPECTOMY;  Surgeon: Denise Hippo, MD;  [facility];  Service: Endoscopy;;  transverse colon, cecal   PORTACATH PLACEMENT Right 02/17/2021   Procedure: INSERTION PORT-A-CATH;  Surgeon: Denise Miyamoto, MD;  Location: [facility];  Service: General;  Laterality: Right;   RADIOACTIVE SEED GUIDED AXILLARY SENTINEL  LYMPH NODE Left 11/20/2021   Procedure: RADIOACTIVE SEED GUIDED LEFT AXILLARY SENTINEL LYMPH NODE DISSECTION;  Surgeon: Denise Miyamoto, MD;  Location: [facility];  Service: General;  Laterality: Left;   TOTAL MASTECTOMY Left 11/20/2021   Procedure: LEFT TOTAL MASTECTOMY;  Surgeon: Denise Miyamoto, MD;  Location: [facility];  Service: General;  Laterality: Left;     FAMILY HISTORY:  Family History  Problem Relation Age of Onset   Breast cancer Mother        dx in her 71s   Heart disease Mother    Thyroid disease Mother    Lung cancer Father        dx in his 22s   Thyroid disease Maternal Aunt    Thyroid nodules Maternal Grandmother 35       goiter   Thyroid disease Maternal Grandmother    Heart disease Maternal Grandfather    Heart disease Paternal Grandmother    Heart disease Paternal Grandfather    Thyroid disease Daughter    Thyroid disease Daughter    Cancer Maternal Uncle        NOS   Cancer Paternal Uncle        NOS   Breast cancer Cousin        pat first cousin died in her 7s;      SOCIAL HISTORY:  reports that she has never smoked. She has never used smokeless tobacco. She reports that she does not drink alcohol and does not use drugs.  Patient is widowed and lives in [place]. She has an adult daughter Denise [name] who lives in [place], and a son Denise [name] who lives in [place].  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    ALLERGIES: Exforge [amlodipine besylate-valsartan], Gadolinium derivatives, Gadopiclenol, Tape, Cheese, Levofloxacin in d5w, Other, Proanthocyanidin, Strawberry extract, Wild lettuce extract (lactuca virosa), and Yeast-related products   MEDICATIONS:  Current Outpatient Medications  Medication Sig Dispense Refill   Acetaminophen (TYLENOL PO) Take 1 tablet by mouth as needed.     gabapentin (NEURONTIN) 300 MG capsule Take 1 capsule by mouth 2 (two) times daily.     hydrALAZINE  (APRESOLINE) 25 MG tablet Take 25 mg by mouth 3 (three) times daily.     Lactulose 20 GM/30ML SOLN Take 15 mLs (10 g total) by mouth at bedtime as needed. 450 mL 0   levothyroxine (SYNTHROID) 75 MCG tablet Take 75 mcg by mouth daily before breakfast.     lidocaine (XYLOCAINE) 2 % solution Use as directed 15 mLs in the mouth or throat as needed (reflux). 100 mL 2   Melatonin 5 MG TABS Take 10 mg by mouth. As needed for sleep     metoCLOPramide (REGLAN) 10 MG tablet Take 1 tablet (10 mg total) by mouth every 6 (six) hours as needed for nausea or vomiting. 30 tablet 0   midodrine (PROAMATINE) 10 MG tablet Take 10 mg by mouth daily.     olmesartan-hydrochlorothiazide (BENICAR HCT) 40-25 MG tablet  Take 1 tablet by mouth daily.     pantoprazole (PROTONIX) 40 MG tablet Take 1 tablet (40 mg total) by mouth daily. 60 tablet 3   promethazine (PHENERGAN) 25 MG suppository Place 1 suppository (25 mg total) rectally every 6 (six) hours as needed for nausea or vomiting. 12 each 0   sucralfate (CARAFATE) 1 g tablet Take 1 tablet (1 g total) by mouth every 4 (four) hours as needed. 180 tablet 0   No current facility-administered medications for this encounter.     REVIEW OF SYSTEMS: On review of systems, the patient reports she is hopeful to tolerate enhertu with Dr. Ellin [name] since she did not tolerate Drinda Butts. She reports she's had several weeks of a headache that is aborted by excedrin. She describes pain in the side of her head, posterior to her left ear. She does find that lying down on her right side in her bed is quite helpful and almost abortive completely for headaches. She denies any changes in hearing or speech. No other complaints are verbalized.      PHYSICAL EXAM:  Unable to assess given encounter type    ECOG = 1  0 - Asymptomatic (Fully active, able to carry on all predisease activities without restriction)  1 - Symptomatic but completely ambulatory (Restricted in physically strenuous  activity but ambulatory and able to carry out work of a light or sedentary nature. For example, light housework, office work)  2 - Symptomatic, <50% in bed during the day (Ambulatory and capable of all self care but unable to carry out any work activities. Up and about more than 50% of waking hours)  3 - Symptomatic, >50% in bed, but not bedbound (Capable of only limited self-care, confined to bed or chair 50% or more of waking hours)  4 - Bedbound (Completely disabled. Cannot carry on any self-care. Totally confined to bed or chair)  5 - Death   Santiago Glad MM, Creech RH, Tormey DC, et al. [phone]). "Toxicity and response criteria of the [facility]". Am. Evlyn Clines. Oncol. 5 (6): 649-55    LABORATORY DATA:  Lab Results  Component Value Date   WBC 2.9 (L) 05/03/2023   HGB 10.7 (L) 05/03/2023   HCT 32.6 (L) 05/03/2023   MCV 109.0 (H) 05/03/2023   PLT 211 05/03/2023   Lab Results  Component Value Date   NA 134 (L) 05/03/2023   K 3.6 05/03/2023   CL 103 05/03/2023   CO2 23 05/03/2023   Lab Results  Component Value Date   ALT 13 05/03/2023   AST 16 05/03/2023   ALKPHOS 104 05/03/2023   BILITOT 0.3 05/03/2023      RADIOGRAPHY: MR Brain W Wo Contrast  Result Date: 05/07/2023 CLINICAL DATA:  Brain metastases, assess treatment response EXAM: MRI HEAD WITHOUT AND WITH CONTRAST TECHNIQUE: Multiplanar, multiecho pulse sequences of the brain and surrounding structures were obtained without and with intravenous contrast. CONTRAST:  7 cc of UA intravenous COMPARISON:  10/23/2022 brain MRI FINDINGS: Brain: 2 lesions in the right lower and lateral frontal cortex are non progressed, up to 6 mm size in seen on 14:91 and 14:99. There is however new areas of linear and nodular superficial enhancement along the right sylvian fissure and adjacent superficial right temporal lobe, left inferior occipital lobe, superior vermis, and to a limited degree at the left sylvian fissure. The  appearance is more concerning for leptomeningeal tumor spread than subacute enhancing infarct given the pattern of enhancement and distribution. There  is also suspected new enhancement at the internal auditory canals and interpeduncular cistern. Vascular: Major flow voids and vascular enhancements are preserved Skull and upper cervical spine: History of bone metastases. A right occipital lesion measuring 12 mm has increased from 6 mm previously. This overlies the right transverse sigmoid sinus without visible invasion. Sinuses/Orbits: Normal marrow signal IMPRESSION: 1. New areas of subarachnoid based enhancement suggesting interval leptomeningeal disease, consider CSF cytology. 2. Pre-existing parenchymal deposits in the right frontal lobe are unchanged. 3. Modest enlargement of a right occipital bone metastasis. Electronically Signed   By: Tiburcio Pea M.D.   On: 05/07/2023 11:11       IMPRESSION/PLAN: 1. Progressive Metastatic Stage IIIC, cT3N3c triple negative invasive lobular carcinoma of the left breast with brain and bone disease. We reviewed the stable post treatment changes in her MRI and the resolution of the punctate focus seen on her prior MRI. The brain oncology conference recommendations were to proceed with repeat MRI in 3 months time. Her recent reaction to the contrast could limit [place] somewhat but we will touch base with Dr. Alfredo Batty in neuro radiology to see what he thinks would be best for future surveillance. She needs to follow up with Dr. Theo Dills as well as she is interested in pursuing more systemic therapy rather than hospice based care which her PCP had suggested. We are in agreement with aggressive surveillance of her brain as well as systemic therapy if Dr. Ellin [name] is also in agreement.  2. Hypersensitivity versus Allergic Reaction to VueWay Contrast. She is able to tolerate this with premedication with prednisone and benadryl.  3. Left sided Headaches. The patient has relief  with Excedrin. If her symptoms do not improve, we will discuss ENT or neurology evaluation.    This encounter was conducted via telephone.  The patient has provided two factor identification and has given verbal consent for this type of encounter and has been advised to only accept a meeting of this type in a secure network environment. The time spent during this encounter was 35 minutes including preparation, discussion, and coordination of the patient's care. The attendants for this meeting include  Ronny Bacon  and [name].  During the encounter,  Ronny Bacon was located at [facility] Radiation Oncology Department.  [name] was located at home.   Osker Mason, [facility]   **Disclaimer: This note was dictated with voice recognition software. Similar sounding words can inadvertently be transcribed and this note may contain transcription errors which may not have been corrected upon publication of note.**

## 2023-05-10 NOTE — Progress Notes (Signed)
Telephone nursing appointment for patient to review most recent scan results. I verified patient's identity x2 and began nursing interview.   Patient reports doing well. No issues conveyed at this time.   Meaningful use complete.   Patient aware of their 3pm-05/10/2023 telephone appointment w/ Laurence Aly PA-C. I left my extension [phone] in case patient needs anything. Patient verbalized understanding. This concludes the nursing interview.   Patient contact [phone]      Ruel Favors, LPN

## 2023-05-11 ENCOUNTER — Other Ambulatory Visit: Payer: Self-pay | Admitting: Radiation Therapy

## 2023-05-11 DIAGNOSIS — C7931 Secondary malignant neoplasm of brain: Secondary | ICD-10-CM

## 2023-05-12 ENCOUNTER — Other Ambulatory Visit: Payer: Self-pay | Admitting: Radiation Oncology

## 2023-05-12 ENCOUNTER — Telehealth: Payer: Self-pay | Admitting: Radiation Oncology

## 2023-05-12 DIAGNOSIS — C7931 Secondary malignant neoplasm of brain: Secondary | ICD-10-CM

## 2023-05-12 DIAGNOSIS — C50912 Malignant neoplasm of unspecified site of left female breast: Secondary | ICD-10-CM

## 2023-05-12 NOTE — Telephone Encounter (Signed)
I called the patient to let her know that I discussed her case with Dr. Barbaraann Cao, Dr. Grace Isaac, Dr. [name] and the recommendations are for lumbar puncture. We discussed this procedure and the referral to IR for this and she is in agreement with this plan. We will follow up with the results of this testing when available.

## 2023-05-19 ENCOUNTER — Encounter (HOSPITAL_COMMUNITY): Payer: Self-pay

## 2023-05-19 ENCOUNTER — Encounter (HOSPITAL_COMMUNITY): Admission: RE | Admit: 2023-05-19 | Payer: Medicare Other | Source: Ambulatory Visit

## 2023-05-19 ENCOUNTER — Ambulatory Visit (HOSPITAL_COMMUNITY)
Admission: RE | Admit: 2023-05-19 | Discharge: 2023-05-19 | Disposition: A | Discharge status: Home / Self Care | Payer: Medicare Other | Source: Ambulatory Visit | Attending: Radiation Oncology | Admitting: Radiation Oncology

## 2023-05-19 DIAGNOSIS — G9389 Other specified disorders of brain: Secondary | ICD-10-CM | POA: Diagnosis not present

## 2023-05-19 DIAGNOSIS — C7931 Secondary malignant neoplasm of brain: Secondary | ICD-10-CM

## 2023-05-19 DIAGNOSIS — C50912 Malignant neoplasm of unspecified site of left female breast: Secondary | ICD-10-CM | POA: Diagnosis not present

## 2023-05-19 DIAGNOSIS — R836 Abnormal cytological findings in cerebrospinal fluid: Secondary | ICD-10-CM | POA: Diagnosis not present

## 2023-05-19 LAB — PROTEIN AND GLUCOSE, CSF
Glucose, CSF: 46 mg/dL (ref 40–70)
Total  Protein, CSF: 36 mg/dL (ref 15–45)

## 2023-05-19 NOTE — Progress Notes (Signed)
Patient tolerated Fluoro guided Lumbar Puncture procedure well today. Labs obtained and sent to lab for processing. Patient ambulatory at departure and verbalized understanding of discharge instructions with no acute distress noted. Short Stay appointment cancelled per Dr. Deanne Coffer no need for Short Stay after lumbar puncture today.

## 2023-05-21 ENCOUNTER — Encounter (HOSPITAL_COMMUNITY): Payer: Self-pay | Admitting: Hematology

## 2023-05-21 ENCOUNTER — Encounter: Payer: Self-pay | Admitting: Hematology

## 2023-05-21 LAB — CYTOLOGY - NON PAP

## 2023-05-24 ENCOUNTER — Telehealth: Payer: Self-pay | Admitting: Radiation Oncology

## 2023-05-24 ENCOUNTER — Inpatient Hospital Stay: Payer: Medicare Other

## 2023-05-24 ENCOUNTER — Inpatient Hospital Stay: Payer: Medicare Other | Attending: Hematology

## 2023-05-24 ENCOUNTER — Inpatient Hospital Stay (HOSPITAL_BASED_OUTPATIENT_CLINIC_OR_DEPARTMENT_OTHER): Payer: Medicare Other | Admitting: Hematology

## 2023-05-24 VITALS — BP 118/76 | HR 66 | Temp 97.8°F | Resp 20

## 2023-05-24 DIAGNOSIS — E039 Hypothyroidism, unspecified: Secondary | ICD-10-CM | POA: Diagnosis not present

## 2023-05-24 DIAGNOSIS — Z5112 Encounter for antineoplastic immunotherapy: Secondary | ICD-10-CM | POA: Insufficient documentation

## 2023-05-24 DIAGNOSIS — Z803 Family history of malignant neoplasm of breast: Secondary | ICD-10-CM | POA: Insufficient documentation

## 2023-05-24 DIAGNOSIS — Z95828 Presence of other vascular implants and grafts: Secondary | ICD-10-CM

## 2023-05-24 DIAGNOSIS — C50912 Malignant neoplasm of unspecified site of left female breast: Secondary | ICD-10-CM

## 2023-05-24 DIAGNOSIS — I10 Essential (primary) hypertension: Secondary | ICD-10-CM | POA: Diagnosis not present

## 2023-05-24 DIAGNOSIS — Z801 Family history of malignant neoplasm of trachea, bronchus and lung: Secondary | ICD-10-CM | POA: Insufficient documentation

## 2023-05-24 DIAGNOSIS — Z171 Estrogen receptor negative status [ER-]: Secondary | ICD-10-CM | POA: Diagnosis not present

## 2023-05-24 DIAGNOSIS — Z9012 Acquired absence of left breast and nipple: Secondary | ICD-10-CM | POA: Insufficient documentation

## 2023-05-24 DIAGNOSIS — D702 Other drug-induced agranulocytosis: Secondary | ICD-10-CM

## 2023-05-24 DIAGNOSIS — Z79890 Hormone replacement therapy (postmenopausal): Secondary | ICD-10-CM | POA: Diagnosis not present

## 2023-05-24 DIAGNOSIS — C50911 Malignant neoplasm of unspecified site of right female breast: Secondary | ICD-10-CM | POA: Diagnosis not present

## 2023-05-24 DIAGNOSIS — C7951 Secondary malignant neoplasm of bone: Secondary | ICD-10-CM | POA: Diagnosis not present

## 2023-05-24 DIAGNOSIS — C7931 Secondary malignant neoplasm of brain: Secondary | ICD-10-CM | POA: Diagnosis not present

## 2023-05-24 DIAGNOSIS — Z79899 Other long term (current) drug therapy: Secondary | ICD-10-CM | POA: Insufficient documentation

## 2023-05-24 DIAGNOSIS — G629 Polyneuropathy, unspecified: Secondary | ICD-10-CM | POA: Diagnosis not present

## 2023-05-24 LAB — CBC WITH DIFFERENTIAL/PLATELET
Abs Immature Granulocytes: 0.02 10*3/uL (ref 0.00–0.07)
Basophils Absolute: 0 10*3/uL (ref 0.0–0.1)
Basophils Relative: 0 %
Eosinophils Absolute: 0.2 10*3/uL (ref 0.0–0.5)
Eosinophils Relative: 3 %
HCT: 32.6 % — ABNORMAL LOW (ref 36.0–46.0)
Hemoglobin: 10.8 g/dL — ABNORMAL LOW (ref 12.0–15.0)
Immature Granulocytes: 0 %
Lymphocytes Relative: 26 %
Lymphs Abs: 1.2 10*3/uL (ref 0.7–4.0)
MCH: 36.4 pg — ABNORMAL HIGH (ref 26.0–34.0)
MCHC: 33.1 g/dL (ref 30.0–36.0)
MCV: 109.8 fL — ABNORMAL HIGH (ref 80.0–100.0)
Monocytes Absolute: 0.5 10*3/uL (ref 0.1–1.0)
Monocytes Relative: 10 %
Neutro Abs: 3 10*3/uL (ref 1.7–7.7)
Neutrophils Relative %: 61 %
Platelets: 252 10*3/uL (ref 150–400)
RBC: 2.97 MIL/uL — ABNORMAL LOW (ref 3.87–5.11)
RDW: 14.4 % (ref 11.5–15.5)
WBC: 4.9 10*3/uL (ref 4.0–10.5)
nRBC: 0 % (ref 0.0–0.2)

## 2023-05-24 LAB — COMPREHENSIVE METABOLIC PANEL
ALT: 14 U/L (ref 0–44)
AST: 17 U/L (ref 15–41)
Albumin: 3.6 g/dL (ref 3.5–5.0)
Alkaline Phosphatase: 111 U/L (ref 38–126)
Anion gap: 5 (ref 5–15)
BUN: 18 mg/dL (ref 8–23)
CO2: 23 mmol/L (ref 22–32)
Calcium: 8.2 mg/dL — ABNORMAL LOW (ref 8.9–10.3)
Chloride: 108 mmol/L (ref 98–111)
Creatinine, Ser: 0.7 mg/dL (ref 0.44–1.00)
GFR, Estimated: >60 mL/min (ref >60)
Glucose, Bld: 116 mg/dL — ABNORMAL HIGH (ref 70–99)
Potassium: 3.6 mmol/L (ref 3.5–5.1)
Sodium: 136 mmol/L (ref 135–145)
Total Bilirubin: 0.6 mg/dL (ref 0.3–1.2)
Total Protein: 6.8 g/dL (ref 6.5–8.1)

## 2023-05-24 LAB — MAGNESIUM: Magnesium: 1.8 mg/dL (ref 1.7–2.4)

## 2023-05-24 MED ORDER — SODIUM CHLORIDE 0.9 % IV SOLN
150.0000 mg | Freq: Once | INTRAVENOUS | Status: AC
  Administered 2023-05-24: 150 mg via INTRAVENOUS
  Filled 2023-05-24: qty 150

## 2023-05-24 MED ORDER — PALONOSETRON HCL INJECTION 0.25 MG/5ML
0.2500 mg | Freq: Once | INTRAVENOUS | Status: AC
  Administered 2023-05-24: 0.25 mg via INTRAVENOUS
  Filled 2023-05-24: qty 5

## 2023-05-24 MED ORDER — CETIRIZINE HCL 10 MG/ML IV SOLN
10.0000 mg | Freq: Once | INTRAVENOUS | Status: AC
  Administered 2023-05-24: 10 mg via INTRAVENOUS
  Filled 2023-05-24: qty 1

## 2023-05-24 MED ORDER — DEXTROSE 5 % IV SOLN: Freq: Once | INTRAVENOUS | Status: AC

## 2023-05-24 MED ORDER — SODIUM CHLORIDE 0.9 % IV SOLN
10.0000 mg | Freq: Once | INTRAVENOUS | Status: AC
  Administered 2023-05-24: 10 mg via INTRAVENOUS
  Filled 2023-05-24: qty 10

## 2023-05-24 MED ORDER — HEPARIN SOD (PORK) LOCK FLUSH 100 UNIT/ML IV SOLN
500.0000 [IU] | Freq: Once | INTRAVENOUS | Status: AC | PRN
  Administered 2023-05-24: 500 [IU]

## 2023-05-24 MED ORDER — SODIUM CHLORIDE 0.9% FLUSH
10.0000 mL | INTRAVENOUS | Status: DC | PRN
  Administered 2023-05-24: 10 mL

## 2023-05-24 MED ORDER — ACETAMINOPHEN 325 MG PO TABS
650.0000 mg | ORAL_TABLET | Freq: Once | ORAL | Status: AC
  Administered 2023-05-24: 650 mg via ORAL
  Filled 2023-05-24: qty 2

## 2023-05-24 MED ORDER — FAM-TRASTUZUMAB DERUXTECAN-NXKI CHEMO 100 MG IV SOLR
4.2000 mg/kg | Freq: Once | INTRAVENOUS | Status: AC
  Administered 2023-05-24: 300 mg via INTRAVENOUS
  Filled 2023-05-24: qty 15

## 2023-05-24 NOTE — Patient Instructions (Signed)
[facility]  Discharge Instructions: Thank you for choosing [facility] to provide your oncology and hematology care.  If you have a lab appointment with the Cancer Center - please note that after April 8th, 2024, all labs will be drawn in the cancer center.  You do not have to check in or register with the main entrance as you have in the past but will complete your check-in in the cancer center.  Wear comfortable clothing and clothing appropriate for easy access to any Portacath or PICC line.   We strive to give you quality time with your provider. You may need to reschedule your appointment if you arrive late (15 or more minutes).  Arriving late affects you and other patients whose appointments are after yours.  Also, if you miss three or more appointments without notifying the office, you may be dismissed from the clinic at the provider's discretion.      For prescription refill requests, have your pharmacy contact our office and allow 72 hours for refills to be completed.    Today you received the following chemotherapy and/or immunotherapy agents Enhertu      To help prevent nausea and vomiting after your treatment, we encourage you to take your nausea medication as directed.  BELOW ARE SYMPTOMS THAT SHOULD BE REPORTED IMMEDIATELY: *FEVER GREATER THAN 100.4 F (38 C) OR HIGHER *CHILLS OR SWEATING *NAUSEA AND VOMITING THAT IS NOT CONTROLLED WITH YOUR NAUSEA MEDICATION *UNUSUAL SHORTNESS OF BREATH *UNUSUAL BRUISING OR BLEEDING *URINARY PROBLEMS (pain or burning when urinating, or frequent urination) *BOWEL PROBLEMS (unusual diarrhea, constipation, pain near the anus) TENDERNESS IN MOUTH AND THROAT WITH OR WITHOUT PRESENCE OF ULCERS (sore throat, sores in mouth, or a toothache) UNUSUAL RASH, SWELLING OR PAIN  UNUSUAL VAGINAL DISCHARGE OR ITCHING   Items with * indicate a potential emergency and should be followed up as soon as possible or go to the  Emergency Department if any problems should occur.  Please show the CHEMOTHERAPY ALERT CARD or IMMUNOTHERAPY ALERT CARD at check-in to the Emergency Department and triage nurse.  Should you have questions after your visit or need to cancel or reschedule your appointment, please contact [facility] AT [facility] [phone]  and follow the prompts.  Office hours are 8:00 a.m. to 4:30 p.m. Monday - Friday. Please note that voicemails left after 4:00 p.m. may not be returned until the following business day.  We are closed weekends and major holidays. You have access to a nurse at all times for urgent questions. Please call the main number to the clinic [phone] and follow the prompts.  For any non-urgent questions, you may also contact your provider using MyChart. We now offer e-Visits for anyone 35 and older to request care online for non-urgent symptoms. For details visit [email or web address].   Also download the MyChart app! Go to the app store, search "MyChart", open the app, select [place], and log in with your MyChart username and password.

## 2023-05-24 NOTE — Telephone Encounter (Signed)
error 

## 2023-05-24 NOTE — Telephone Encounter (Signed)
I spoke with the patient regarding her CSF analysis including cytology and protein and glucose studies.  She met with Dr. Ellin Saba as well today and received her infusion.  He is planning to order additional CT scans for restaging, and she will see Dr. Barbaraann Cao later this week.  We will discuss her case in multidisciplinary brain oncology conference on Monday, but I let her know that Dr. [name] was in favor of short-term follow-up MRI.  She confirms that she has done very well since her procedure without any headaches and is fully functional without any notable neurologic deficits.

## 2023-05-24 NOTE — Progress Notes (Signed)
[facility] [address], [place] [address]    Clinic Day:  05/24/2023  Referring physician: Toma Deiters, MD  Patient Care Team: Toma Deiters, MD as PCP - General (Internal Medicine) [name], MD as Medical Oncologist (Medical Oncology)   ASSESSMENT & PLAN:   Assessment: 1.  T3N3c (stage IIIc) triple negative invasive lobular carcinoma of the left breast: - She felt lump in her left breast for more than 6 months, but thought it was secondary to fibrocystic disease which she had all her life.  When she started having pain, she reached out to Dr. Olena Leatherwood. - Ultrasound-guided left breast and left axillary lymph node biopsy on 01/15/2021 - Pathology consistent with invasive lobular carcinoma, E-cadherin negative.  ER/PR/HER2 negative.  HER2 2+ by IHC, negative by FISH.  Ki-67 is 5%.  Lymph node core biopsy was consistent with metastatic carcinoma.  Grade 2. - PET scan on 02/03/2021 showed involvement of left supraclavicular, subpectoral, axillary lymph nodes along with the breast mass.  10 mm left lung nodule which is hypometabolic.  Spinal cord lesion at T12 level with a strong uptake. - MRI of the lumbar spine with and without contrast on 02/20/2021 showed no mass or abnormal enhancement within the canal at the T12 level to correspond to the site of PET scan positive.  No marrow replacing bone lesion. - 12 weeks of carboplatin and paclitaxel weekly and every 3 weeks pembrolizumab followed by 4 cycles of dose dense AC with pembrolizumab ([id number]) from 02/27/2021 through 10/01/2021 - PET scan on 10/16/2021: Reduction in metabolic activity in the size of the left axillary lymph node and no evidence of breast hypermetabolism on PET scan. - Left mastectomy and lymph node excision by Dr. Magnus Ivan on 11/20/2021 - Pathology: 6.2 cm invasive lobular carcinoma, grade 2, margins negative.  19/21 lymph nodes involved with macrometastasis.  ypT3, YPN3A - Adjuvant  pembrolizumab started on 01/01/2022. -CREATE-X capecitabine 1500 mg twice daily 2 weeks on/1 week off started on 01/01/2022.  Last dose of pembrolizumab on 05/11/2022 - Germline mutation testing ([facility] [employer]) negative - [employer] (06/04/2022): T p53, K-ras V14 I, CDKN2A.  MSI-high not detected. - PET scan (05/28/2022): Multifocal bone lesions involving T11 vertebral body, right posterior sixth rib, left femoral neck, left scapular glenoid.  No other metastatic disease. - Sacituzumab cycle 1 on 08/18/2022.  She was hospitalized with neutropenic fever and severe diarrhea from 08/24/2022 through 08/27/2022. - CTAP (10/25/2021): Generalized mild to moderate wall thickening throughout the nondistended large bowel with faint pericolonic fat haziness compatible with nonspecific infectious/inflammatory colitis.  No free air.  C. difficile x 2 was negative. - She has UG T1 A1*28 heterozygosity. - Cycle 2 Sacituzumab dose reduced by 50%.  Discontinued secondary to poor tolerance. - Enhertu 3.2 Mg/KG cycle 1 on 11/04/2022   2.  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  She is non-smoker. - Mother died of breast cancer.  Maternal grandmother died very young in her 33s, sister has fibrocystic disease.  Father died of lung cancer and was a smoker.    Plan: 1.  Metastatic TNBC to the bones: - She has tolerated last cycle of Enhertu very well.  She is also tolerating G-CSF very well. - She had constipation for 3 to 4 days after last treatment.  Most likely from antinausea medication. - She was told to take MiraLAX daily for the next 2 to 3 days. - Reviewed labs  today: Normal LFTs and creatinine.  CBC grossly normal. - Recommend proceeding with Enhertu today.  RTC 3 weeks for follow-up.  Will plan on doing restaging CT CAP with contrast and tumor markers prior to next visit.  2.  Brain lesions (SRS to 3 brain lesions on 06/16/2022): - MRI of the brain on  04/23/2023 showed new areas of subarachnoid based enhancement suggesting interval leptomeningeal disease.  Pre-existing parenchymal deposits in the right frontal lobe unchanged.  Modest enlargement of the right occipital bone metastasis. - She also had reaction with MRI contrast. - She had lumbar puncture done on 05/19/2023.  Total protein is normal at 36 mg per DL.  Glucose is also normal at 46 mg per DL.  Cytology showed rare large atypical cells insufficient for further workup. - She has frontal headaches when she wakes up which goes away after taking Tylenol.  Denies any photophobia, double vision. - She has a follow-up with Dr. Barbaraann Cao on Thursday.  3.  Peripheral neuropathy: - Continue gabapentin 300 mg at bedtime.  Symptoms well-controlled.  4.  Hypomagnesemia: - Continue magnesium 1 tablet daily.  Magnesium is normal at 1.7.    Orders Placed This Encounter  Procedures   CT CHEST ABDOMEN PELVIS W CONTRAST    Standing Status:   Future    Standing Expiration Date:   05/23/2024    Order Specific Question:   If indicated for the ordered procedure, I authorize the administration of contrast media per Radiology protocol    Answer:   Yes    Order Specific Question:   Does the patient have a contrast media/X-ray dye allergy?    Answer:   No    Order Specific Question:   Preferred imaging location?    Answer:   [facility]    Order Specific Question:   Release to patient    Answer:   Immediate    Order Specific Question:   If indicated for the ordered procedure, I authorize the administration of oral contrast media per Radiology protocol    Answer:   Yes   CBC with Differential/Platelet    Standing Status:   Future    Standing Expiration Date:   05/23/2024    Order Specific Question:   Release to patient    Answer:   Immediate   Comprehensive metabolic panel    Standing Status:   Future    Standing Expiration Date:   05/23/2024    Order Specific Question:   Release to patient     Answer:   Immediate   Cancer antigen 15-3    Standing Status:   Future    Standing Expiration Date:   05/23/2024   Cancer antigen 27.29    Standing Status:   Future    Standing Expiration Date:   05/23/2024   Magnesium    Standing Status:   Future    Standing Expiration Date:   07/04/2024   CBC with Differential    Standing Status:   Future    Standing Expiration Date:   07/05/2024   Comprehensive metabolic panel    Standing Status:   Future    Standing Expiration Date:   07/05/2024   Magnesium    Standing Status:   Future    Standing Expiration Date:   07/25/2024   CBC with Differential    Standing Status:   Future    Standing Expiration Date:   07/26/2024   Comprehensive metabolic panel    Standing Status:   Future  Standing Expiration Date:   07/26/2024      I,Katie Daubenspeck,acting as a scribe for [name], MD.,have documented all relevant documentation on the behalf of [name], MD,as directed by  [name], MD while in the presence of [name], MD.   I, [name] MD, have reviewed the above documentation for accuracy and completeness, and I agree with the above.   [name], MD   9/9/20244:18 PM  CHIEF COMPLAINT:   Diagnosis: Metastatic triple negative breast cancer to the bones    Cancer Staging  Invasive lobular carcinoma of left breast in female [facility]) Staging form: Breast, AJCC 8th Edition - Clinical stage from 01/23/2021: cT3, cN3c, G2, ER-, PR-, HER2- - Unsigned - Pathologic stage from 12/22/2021: No Stage Recommended (ypT3, pN3a, cM0, G2, ER-, PR-, HER2-) - Signed by [name], MD on 12/22/2021    Prior Therapy: 1.  Neoadjuvant chemotherapy with weekly carboplatin and paclitaxel and dose dense AC with pembrolizumab 2. Left mastectomy and lymph node excision by Dr. Magnus Ivan on 11/20/2021 3.  Sacituzumab on 08/18/2022 and 10/08/2021, discontinued due to poor tolerance.  Current Therapy:   Enhertu every 21 days    HISTORY OF PRESENT ILLNESS:   Oncology History  Invasive lobular carcinoma of left breast in female [facility])  01/23/2021 Initial Diagnosis   Invasive lobular carcinoma of left breast in female [facility])   02/19/2021 Genetic Testing   Negative genetic testing on the CancerNext-Expanded+RNAinsight panel.  SMARCB1 VUS identified.  The CancerNext-Expanded gene panel offered by [facility] and includes sequencing and rearrangement analysis for the following 77 genes: AIP, ALK, APC*, ATM*, AXIN2, BAP1, BARD1, BLM, BMPR1A, BRCA1*, BRCA2*, BRIP1*, CDC73, CDH1*, CDK4, CDKN1B, CDKN2A, CHEK2*, CTNNA1, DICER1, FANCC, FH, FLCN, GALNT12, KIF1B, LZTR1, MAX, MEN1, MET, MLH1*, MSH2*, MSH3, MSH6*, MUTYH*, NBN, NF1*, NF2, NTHL1, PALB2*, PHOX2B, PMS2*, POT1, PRKAR1A, PTCH1, PTEN*, RAD51C*, RAD51D*, RB1, RECQL, RET, SDHA, SDHAF2, SDHB, SDHC, SDHD, SMAD4, SMARCA4, SMARCB1, SMARCE1, STK11, SUFU, TMEM127, TP53*, TSC1, TSC2, VHL and XRCC2 (sequencing and deletion/duplication); EGFR, EGLN1, HOXB13, KIT, MITF, PDGFRA, POLD1, and POLE (sequencing only); EPCAM and GREM1 (deletion/duplication only). DNA and RNA analyses performed for * genes. The report date is February 19, 2021.   02/27/2021 - 05/11/2022 Chemotherapy   Patient is on Treatment Plan : BREAST Pembrolizumab + Carboplatin D1,8,15+ Paclitaxel D1,8,15 q21d X 4 cycles / Pembrolizumab + AC q21d x 4 cycles     12/22/2021 Cancer Staging   Staging form: Breast, AJCC 8th Edition - Pathologic stage from 12/22/2021: No Stage Recommended (ypT3, pN3a, cM0, G2, ER-, PR-, HER2-) - Signed by [name], MD on 12/22/2021 Histopathologic type: Lobular carcinoma, NOS Stage prefix: Post-therapy Method of lymph node assessment: Axillary lymph node dissection Multigene prognostic tests performed: None Histologic grading system: 3 grade system   08/18/2022 - 10/08/2022 Chemotherapy   Patient is on Treatment Plan : BREAST METASTATIC Sacituzumab govitecan-hziy  Drinda Butts) D1,8 q21d     11/04/2022 -  Chemotherapy   Patient is on Treatment Plan : BREAST METASTATIC Fam-Trastuzumab Deruxtecan-nxki (Enhertu) (5.4) q21d        INTERVAL HISTORY:   Denitra is a 72 y.o. female presenting to clinic today for follow up of Metastatic triple negative breast cancer to the bones. She was last seen by me on 05/03/23.  Since her last visit, she underwent spinal tap procedure on 05/19/23. Cytology from the cerebral fluid showed atypical cells within limitation of extremely scant cellularity.  Today, she states that she is doing well overall. Her appetite level is  at 100%. Her energy level is at 75%.  PAST MEDICAL HISTORY:   Past Medical History: Past Medical History:  Diagnosis Date   Asthma    as child   Cancer (HCC) 12/2020   left breast IMC   Complication of anesthesia    pateitn states,' I coded when I had my D&Cmany years ago.   Dyspnea    Family history of breast cancer    Hypertension    Hypothyroidism    PONV (postoperative nausea and vomiting)    Port-A-Cath in place 02/26/2021    Surgical History: Past Surgical History:  Procedure Laterality Date   ABDOMINAL HYSTERECTOMY     BIOPSY  01/17/2018   Procedure: BIOPSY;  Surgeon: Malissa Hippo, MD;  [facility];  Service: Endoscopy;;  duodenum,gastric   CHOLECYSTECTOMY     COLONOSCOPY WITH PROPOFOL N/A 01/17/2018   Procedure: COLONOSCOPY WITH PROPOFOL;  Surgeon: Malissa Hippo, MD;  [facility]: [facility];  Service: Endoscopy;  Laterality: N/A;  7:30   DILATION AND CURETTAGE OF UTERUS     ESOPHAGOGASTRODUODENOSCOPY (EGD) WITH PROPOFOL N/A 01/17/2018   Procedure: ESOPHAGOGASTRODUODENOSCOPY (EGD) WITH PROPOFOL;  Surgeon: Malissa Hippo, MD;  [facility];  Service: Endoscopy;  Laterality: N/A;   POLYPECTOMY  01/17/2018   Procedure: POLYPECTOMY;  Surgeon: Malissa Hippo, MD;  [facility];  Service: Endoscopy;;  transverse colon, cecal   PORTACATH PLACEMENT  Right 02/17/2021   Procedure: INSERTION PORT-A-CATH;  Surgeon: Abigail Miyamoto, MD;  Location: [facility];  Service: General;  Laterality: Right;   RADIOACTIVE SEED GUIDED AXILLARY SENTINEL LYMPH NODE Left 11/20/2021   Procedure: RADIOACTIVE SEED GUIDED LEFT AXILLARY SENTINEL LYMPH NODE DISSECTION;  Surgeon: Abigail Miyamoto, MD;  Location: [facility];  Service: General;  Laterality: Left;   TOTAL MASTECTOMY Left 11/20/2021   Procedure: LEFT TOTAL MASTECTOMY;  Surgeon: Abigail Miyamoto, MD;  Location: [facility];  Service: General;  Laterality: Left;    Social History: Social History   Socioeconomic History   Marital status: Widowed    Spouse name: Not on file   Number of children: 3   Years of education: Not on file   Highest education level: Not on file  Occupational History   Occupation: [employer]   Occupation: retired  Tobacco Use   Smoking status: Never   Smokeless tobacco: Never  Vaping Use   Vaping status: Never Used  Substance and Sexual Activity   Alcohol use: Never   Drug use: Never   Sexual activity: Not Currently    Birth control/protection: Surgical  Other Topics Concern   Not on file  Social History Narrative   Not on file   Social Determinants of Health   Financial Resource Strain: Low Risk  (07/10/2022)   Overall Financial Resource Strain (CARDIA)    Difficulty of Paying Living Expenses: Not hard at all  Food Insecurity: No Food Insecurity (06/30/2022)   Hunger Vital Sign    Worried About Running Out of Food in the Last Year: Never true    Ran Out of Food in the Last Year: Never true  Transportation Needs: No Transportation Needs (07/10/2022)   PRAPARE - Administrator, Civil Service (Medical): No    Lack of Transportation (Non-Medical): No  Physical Activity: Insufficiently Active (01/22/2021)   Exercise Vital Sign    Days of Exercise per Week: 3 days    Minutes of Exercise per  Session: 30  min  Stress: No Stress Concern Present (01/22/2021)   [employer] of Occupational Health - Occupational Stress Questionnaire    Feeling of Stress : Not at all  Social Connections: Moderately Integrated (01/22/2021)   Social Connection and Isolation Panel [NHANES]    Frequency of Communication with Friends and Family: More than three times a week    Frequency of Social Gatherings with Friends and Family: More than three times a week    Attends Religious Services: More than 4 times per year    Active Member of [employer] or Organizations: No    Attends Engineer, structural: More than 4 times per year    Marital Status: Widowed  Intimate Partner Violence: Not At Risk (05/28/2022)   Humiliation, Afraid, Rape, and Kick questionnaire    Fear of Current or Ex-Partner: No    Emotionally Abused: No    Physically Abused: No    Sexually Abused: No    Family History: Family History  Problem Relation Age of Onset   Breast cancer Mother        dx in her 47s   Heart disease Mother    Thyroid disease Mother    Lung cancer Father        dx in his 54s   Thyroid disease Maternal Aunt    Thyroid nodules Maternal Grandmother 35       goiter   Thyroid disease Maternal Grandmother    Heart disease Maternal Grandfather    Heart disease Paternal Grandmother    Heart disease Paternal Grandfather    Thyroid disease Daughter    Thyroid disease Daughter    Cancer Maternal Uncle        NOS   Cancer Paternal Uncle        NOS   Breast cancer Cousin        pat first cousin died in her 20s;     Current Medications:  Current Outpatient Medications:    Acetaminophen (TYLENOL PO), Take 1 tablet by mouth as needed., Disp: , Rfl:    gabapentin (NEURONTIN) 300 MG capsule, Take 1 capsule by mouth 2 (two) times daily., Disp: , Rfl:    hydrALAZINE (APRESOLINE) 25 MG tablet, Take 25 mg by mouth 3 (three) times daily., Disp: , Rfl:    Lactulose 20 GM/30ML SOLN, Take 15 mLs (10 g total) by  mouth at bedtime as needed., Disp: 450 mL, Rfl: 0   levothyroxine (SYNTHROID) 75 MCG tablet, Take 75 mcg by mouth daily before breakfast., Disp: , Rfl:    lidocaine (XYLOCAINE) 2 % solution, Use as directed 15 mLs in the mouth or throat as needed (reflux)., Disp: 100 mL, Rfl: 2   Melatonin 5 MG TABS, Take 10 mg by mouth. As needed for sleep, Disp: , Rfl:    metoCLOPramide (REGLAN) 10 MG tablet, Take 1 tablet (10 mg total) by mouth every 6 (six) hours as needed for nausea or vomiting., Disp: 30 tablet, Rfl: 0   midodrine (PROAMATINE) 10 MG tablet, Take 10 mg by mouth daily., Disp: , Rfl:    olmesartan-hydrochlorothiazide (BENICAR HCT) 40-25 MG tablet, Take 1 tablet by mouth daily., Disp: , Rfl:    pantoprazole (PROTONIX) 40 MG tablet, Take 1 tablet (40 mg total) by mouth daily., Disp: 60 tablet, Rfl: 3   promethazine (PHENERGAN) 25 MG suppository, Place 1 suppository (25 mg total) rectally every 6 (six) hours as needed for nausea or vomiting., Disp: 12 each, Rfl: 0   sucralfate (CARAFATE) 1 g tablet,  Take 1 tablet (1 g total) by mouth every 4 (four) hours as needed., Disp: 180 tablet, Rfl: 0 No current facility-administered medications for this visit.  Facility-Administered Medications Ordered in Other Visits:    sodium chloride flush (NS) 0.9 % injection 10 mL, 10 mL, Intracatheter, PRN, [name], MD, 10 mL at 05/24/23 1614   Allergies: Allergies  Allergen Reactions   Exforge [Amlodipine Besylate-Valsartan] Nausea And Vomiting   Gadolinium Derivatives Anaphylaxis, Hives and Itching    Pt should not have MRI contrast in outpt facility. Pt developed hives and itching AFTER taking 13 hr prep. Per Dr. Charise Killian. 04/23/23   Gadopiclenol Hives and Itching    13 hr prep prior to contrast admin    Tape Other (See Comments)    Redness and Irriatation   Cheese     Hard cheese   Levofloxacin In D5w Hives   Other    Proanthocyanidin    Strawberry Extract Diarrhea    Seeds,nuts, lettuce,  grapes   Wild Lettuce Extract (Lactuca Virosa)    Yeast-Related Products Hives    Mold on bread    REVIEW OF SYSTEMS:   Review of Systems  Constitutional:  Negative for chills, fatigue and fever.  HENT:   Negative for lump/mass, mouth sores, nosebleeds, sore throat and trouble swallowing.   Eyes:  Negative for eye problems.  Respiratory:  Positive for shortness of breath. Negative for cough.   Cardiovascular:  Positive for palpitations. Negative for chest pain and leg swelling.  Gastrointestinal:  Positive for constipation. Negative for abdominal pain, diarrhea, nausea and vomiting.  Genitourinary:  Negative for bladder incontinence, difficulty urinating, dysuria, frequency, hematuria and nocturia.   Musculoskeletal:  Negative for arthralgias, back pain, flank pain, myalgias and neck pain.  Skin:  Negative for itching and rash.  Neurological:  Positive for headaches. Negative for dizziness and numbness.  Hematological:  Does not bruise/bleed easily.  Psychiatric/Behavioral:  Positive for sleep disturbance. Negative for depression and suicidal ideas. The patient is not nervous/anxious.   All other systems reviewed and are negative.    VITALS:   There were no vitals taken for this visit.  Wt Readings from Last 3 Encounters:  05/24/23 163 lb (73.9 kg)  05/03/23 163 lb (73.9 kg)  04/13/23 162 lb 12.8 oz (73.8 kg)    There is no height or weight on file to calculate BMI.  Performance status (ECOG): 1 - Symptomatic but completely ambulatory  PHYSICAL EXAM:   Physical Exam Vitals and nursing note reviewed. Exam conducted with a chaperone present.  Constitutional:      Appearance: Normal appearance.  Cardiovascular:     Rate and Rhythm: Normal rate and regular rhythm.     Pulses: Normal pulses.     Heart sounds: Normal heart sounds.  Pulmonary:     Effort: Pulmonary effort is normal.     Breath sounds: Normal breath sounds.  Abdominal:     Palpations: Abdomen is soft. There  is no hepatomegaly, splenomegaly or mass.     Tenderness: There is no abdominal tenderness.  Musculoskeletal:     Right lower leg: No edema.     Left lower leg: No edema.  Lymphadenopathy:     Cervical: No cervical adenopathy.     Right cervical: No superficial, deep or posterior cervical adenopathy.    Left cervical: No superficial, deep or posterior cervical adenopathy.     Upper Body:     Right upper body: No supraclavicular or axillary adenopathy.  Left upper body: No supraclavicular or axillary adenopathy.  Neurological:     General: No focal deficit present.     Mental Status: She is alert and oriented to person, place, and time.  Psychiatric:        Mood and Affect: Mood normal.        Behavior: Behavior normal.     LABS:      Latest Ref Rng & Units 05/24/2023    1:02 PM 05/03/2023   12:15 PM 04/12/2023    1:28 PM  CBC  WBC 4.0 - 10.5 K/uL 4.9  2.9  2.9   Hemoglobin 12.0 - 15.0 g/dL 16.1  09.6  04.5   Hematocrit 36.0 - 46.0 % 32.6  32.6  31.7   Platelets 150 - 400 K/uL 252  211  185       Latest Ref Rng & Units 05/24/2023    1:00 PM 05/03/2023   12:15 PM 04/12/2023    1:28 PM  CMP  Glucose 70 - 99 mg/dL 409  811  914   BUN 8 - 23 mg/dL 18  16  22    Creatinine 0.44 - 1.00 mg/dL 7.82  9.56  2.13   Sodium 135 - 145 mmol/L 136  134  134   Potassium 3.5 - 5.1 mmol/L 3.6  3.6  3.8   Chloride 98 - 111 mmol/L 108  103  104   CO2 22 - 32 mmol/L 23  23  24    Calcium 8.9 - 10.3 mg/dL 8.2  8.4  8.2   Total Protein 6.5 - 8.1 g/dL 6.8  6.7  6.7   Total Bilirubin 0.3 - 1.2 mg/dL 0.6  0.3  0.5   Alkaline Phos 38 - 126 U/L 111  104  126   AST 15 - 41 U/L 17  16  23    ALT 0 - 44 U/L 14  13  18       No results found for: "CEA1", "CEA" / No results found for: "CEA1", "CEA" No results found for: "PSA1" No results found for: "YQM578" No results found for: "CAN125"  No results found for: "TOTALPROTELP", "ALBUMINELP", "A1GS", "A2GS", "BETS", "BETA2SER", "GAMS", "MSPIKE",  "SPEI" No results found for: "TIBC", "FERRITIN", "IRONPCTSAT" No results found for: "LDH"   STUDIES:   DG FL GUIDED LUMBAR PUNCTURE  Result Date: 05/19/2023 CLINICAL DATA:  Right frontal lobe lesion. History of breast carcinoma. EXAM: LUMBAR PUNCTURE UNDER FLUOROSCOPY FLUOROSCOPY: Radiation Exposure Index (as provided by the fluoroscopic device): 1.8 mGy air Kerma TECHNIQUE: An appropriate skin entry site was determined fluoroscopically. Operator donned sterile gloves and mask. Skin site was marked, then prepped with Betadine, draped in usual sterile fashion, and infiltrated locally with 1% lidocaine. A 20 gauge spinal needle advanced into the thecal sac at L2-3 from a left interlaminar approach. Clear colorless CSF spontaneously returned . 14ml CSF were collected and divided among 4 sterile vials for the requested laboratory studies. The needle was then removed. COMPLICATIONS: None immediate IMPRESSION: 1. Technically successful lumbar puncture under fluoroscopy. Electronically Signed   By: Corlis Leak M.D.   On: 05/19/2023 10:48

## 2023-05-24 NOTE — Patient Instructions (Addendum)
[facility] - Closed  Discharge Instructions  You were seen and examined today by Dr. Ellin Saba.  Dr. Ellin Saba discussed your most recent lab work which revealed that everything looks good and stable.   Start taking a stool softener once daily.   Follow-up as scheduled.    Thank you for choosing [facility] to provide your oncology and hematology care.   To afford each patient quality time with our provider, please arrive at least 15 minutes before your scheduled appointment time. You may need to reschedule your appointment if you arrive late (10 or more minutes). Arriving late affects you and other patients whose appointments are after yours.  Also, if you miss three or more appointments without notifying the office, you may be dismissed from the clinic at the provider's discretion.    Again, thank you for choosing [facility].  Our hope is that these requests will decrease the amount of time that you wait before being seen by our physicians.   If you have a lab appointment with the Cancer Center - please note that after April 8th, all labs will be drawn in the cancer center.  You do not have to check in or register with the main entrance as you have in the past but will complete your check-in at the cancer center.            _____________________________________________________________  Should you have questions after your visit to [facility], please contact our office at [phone] and follow the prompts.  Our office hours are 8:00 a.m. to 4:30 p.m. Monday - Thursday and 8:00 a.m. to 2:30 p.m. Friday.  Please note that voicemails left after 4:00 p.m. may not be returned until the following business day.  We are closed weekends and all major holidays.  You do have access to a nurse 24-7, just call the main number to the clinic [phone] and do not press any options, hold on the line and a nurse will answer the  phone.    For prescription refill requests, have your pharmacy contact our office and allow 72 hours.    Masks are no longer required in the cancer centers. If you would like for your care team to wear a mask while they are taking care of you, please let them know. You may have one support person who is at least 74 years old accompany you for your appointments.

## 2023-05-24 NOTE — Progress Notes (Signed)
Labs reviewed with MD today, ok to treat per MD.   Treatment given per orders. Patient tolerated it well without problems. Vitals stable and discharged home from clinic ambulatory. Follow up as scheduled.

## 2023-05-25 LAB — CANCER ANTIGEN 15-3: CA 15-3: 35.8 U/mL — ABNORMAL HIGH (ref 0.0–25.0)

## 2023-05-25 LAB — CANCER ANTIGEN 27.29: CA 27.29: 55.3 U/mL — ABNORMAL HIGH (ref 0.0–38.6)

## 2023-05-26 ENCOUNTER — Other Ambulatory Visit: Payer: Self-pay | Admitting: Radiation Therapy

## 2023-05-26 ENCOUNTER — Inpatient Hospital Stay: Payer: Medicare Other

## 2023-05-26 ENCOUNTER — Other Ambulatory Visit: Payer: Self-pay

## 2023-05-26 VITALS — BP 116/80 | HR 72 | Temp 98.1°F | Resp 18

## 2023-05-26 DIAGNOSIS — C7951 Secondary malignant neoplasm of bone: Secondary | ICD-10-CM | POA: Diagnosis not present

## 2023-05-26 DIAGNOSIS — Z95828 Presence of other vascular implants and grafts: Secondary | ICD-10-CM

## 2023-05-26 DIAGNOSIS — G629 Polyneuropathy, unspecified: Secondary | ICD-10-CM | POA: Diagnosis not present

## 2023-05-26 DIAGNOSIS — Z5112 Encounter for antineoplastic immunotherapy: Secondary | ICD-10-CM | POA: Diagnosis not present

## 2023-05-26 DIAGNOSIS — Z171 Estrogen receptor negative status [ER-]: Secondary | ICD-10-CM | POA: Diagnosis not present

## 2023-05-26 DIAGNOSIS — C50912 Malignant neoplasm of unspecified site of left female breast: Secondary | ICD-10-CM

## 2023-05-26 DIAGNOSIS — C50911 Malignant neoplasm of unspecified site of right female breast: Secondary | ICD-10-CM | POA: Diagnosis not present

## 2023-05-26 DIAGNOSIS — D702 Other drug-induced agranulocytosis: Secondary | ICD-10-CM

## 2023-05-26 DIAGNOSIS — C7931 Secondary malignant neoplasm of brain: Secondary | ICD-10-CM | POA: Diagnosis not present

## 2023-05-26 MED ORDER — FILGRASTIM-SNDZ 480 MCG/0.8ML IJ SOSY: 480.0000 ug | PREFILLED_SYRINGE | Freq: Once | INTRAMUSCULAR | Status: DC

## 2023-05-26 MED ORDER — PEGFILGRASTIM-FPGK 6 MG/0.6ML ~~LOC~~ SOSY
6.0000 mg | PREFILLED_SYRINGE | Freq: Once | SUBCUTANEOUS | Status: AC
  Administered 2023-05-26: 6 mg via SUBCUTANEOUS
  Filled 2023-05-26: qty 0.6

## 2023-05-26 NOTE — Progress Notes (Signed)
Patient presents today for Stimufend injection. Patient tolerated injection in right arm with no complaints voiced.  Site clean and dry with no bruising or swelling noted.  No complaints of pain.  Discharged with vital signs stable and no signs or symptoms of distress noted.

## 2023-05-26 NOTE — Patient Instructions (Signed)
[facility]  Discharge Instructions: Thank you for choosing [facility] to provide your oncology and hematology care.  If you have a lab appointment with the Cancer Center - please note that after April 8th, 2024, all labs will be drawn in the cancer center.  You do not have to check in or register with the main entrance as you have in the past but will complete your check-in in the cancer center.  Wear comfortable clothing and clothing appropriate for easy access to any Portacath or PICC line.   We strive to give you quality time with your provider. You may need to reschedule your appointment if you arrive late (15 or more minutes).  Arriving late affects you and other patients whose appointments are after yours.  Also, if you miss three or more appointments without notifying the office, you may be dismissed from the clinic at the provider's discretion.      For prescription refill requests, have your pharmacy contact our office and allow 72 hours for refills to be completed.    Today you received the following Stimufend injection.   Pegfilgrastim Injection What is this medication? PEGFILGRASTIM (PEG fil gra stim) lowers the risk of infection in people who are receiving chemotherapy. It works by Systems analyst make more white blood cells, which protects your body from infection. It may also be used to help people who have been exposed to high doses of radiation. This medicine may be used for other purposes; ask your health care provider or pharmacist if you have questions. COMMON BRAND NAME(S): Cherly Hensen, Neulasta, Nyvepria, Stimufend, UDENYCA, UDENYCA ONBODY, Ziextenzo What should I tell my care team before I take this medication? They need to know if you have any of these conditions: Kidney disease Latex allergy Ongoing radiation therapy Sickle cell disease Skin reactions to acrylic adhesives (On-Body Injector only) An unusual or allergic  reaction to pegfilgrastim, filgrastim, other medications, foods, dyes, or preservatives Pregnant or trying to get pregnant Breast-feeding How should I use this medication? This medication is for injection under the skin. If you get this medication at home, you will be taught how to prepare and give the pre-filled syringe or how to use the On-body Injector. Refer to the patient Instructions for Use for detailed instructions. Use exactly as directed. Tell your care team immediately if you suspect that the On-body Injector may not have performed as intended or if you suspect the use of the On-body Injector resulted in a missed or partial dose. It is important that you put your used needles and syringes in a special sharps container. Do not put them in a trash can. If you do not have a sharps container, call your pharmacist or care team to get one. Talk to your care team about the use of this medication in children. While this medication may be prescribed for selected conditions, precautions do apply. Overdosage: If you think you have taken too much of this medicine contact a poison control center or emergency room at once. NOTE: This medicine is only for you. Do not share this medicine with others. What if I miss a dose? It is important not to miss your dose. Call your care team if you miss your dose. If you miss a dose due to an On-body Injector failure or leakage, a new dose should be administered as soon as possible using a single prefilled syringe for manual use. What may interact with this medication? Interactions have not  been studied. This list may not describe all possible interactions. Give your health care provider a list of all the medicines, herbs, non-prescription drugs, or dietary supplements you use. Also tell them if you smoke, drink alcohol, or use illegal drugs. Some items may interact with your medicine. What should I watch for while using this medication? Your condition will be  monitored carefully while you are receiving this medication. You may need blood work done while you are taking this medication. Talk to your care team about your risk of cancer. You may be more at risk for certain types of cancer if you take this medication. If you are going to need a MRI, CT scan, or other procedure, tell your care team that you are using this medication (On-Body Injector only). What side effects may I notice from receiving this medication? Side effects that you should report to your care team as soon as possible: Allergic reactions--skin rash, itching, hives, swelling of the face, lips, tongue, or throat Capillary leak syndrome--stomach or muscle pain, unusual weakness or fatigue, feeling faint or lightheaded, decrease in the amount of urine, swelling of the ankles, hands, or feet, trouble breathing High white blood cell level--fever, fatigue, trouble breathing, night sweats, change in vision, weight loss Inflammation of the aorta--fever, fatigue, back, chest, or stomach pain, severe headache Kidney injury (glomerulonephritis)--decrease in the amount of urine, red or dark brown urine, foamy or bubbly urine, swelling of the ankles, hands, or feet Shortness of breath or trouble breathing Spleen injury--pain in upper left stomach or shoulder Unusual bruising or bleeding Side effects that usually do not require medical attention (report to your care team if they continue or are bothersome): Bone pain Pain in the hands or feet This list may not describe all possible side effects. Call your doctor for medical advice about side effects. You may report side effects to FDA at 1-800-FDA-1088. Where should I keep my medication? Keep out of the reach of children. If you are using this medication at home, you will be instructed on how to store it. Throw away any unused medication after the expiration date on the label. NOTE: This sheet is a summary. It may not cover all possible  information. If you have questions about this medicine, talk to your doctor, pharmacist, or health care provider.  2024 Elsevier/Gold Standard (2021-08-01 00:00:00)     To help prevent nausea and vomiting after your treatment, we encourage you to take your nausea medication as directed.  BELOW ARE SYMPTOMS THAT SHOULD BE REPORTED IMMEDIATELY: *FEVER GREATER THAN 100.4 F (38 C) OR HIGHER *CHILLS OR SWEATING *NAUSEA AND VOMITING THAT IS NOT CONTROLLED WITH YOUR NAUSEA MEDICATION *UNUSUAL SHORTNESS OF BREATH *UNUSUAL BRUISING OR BLEEDING *URINARY PROBLEMS (pain or burning when urinating, or frequent urination) *BOWEL PROBLEMS (unusual diarrhea, constipation, pain near the anus) TENDERNESS IN MOUTH AND THROAT WITH OR WITHOUT PRESENCE OF ULCERS (sore throat, sores in mouth, or a toothache) UNUSUAL RASH, SWELLING OR PAIN  UNUSUAL VAGINAL DISCHARGE OR ITCHING   Items with * indicate a potential emergency and should be followed up as soon as possible or go to the Emergency Department if any problems should occur.  Please show the CHEMOTHERAPY ALERT CARD or IMMUNOTHERAPY ALERT CARD at check-in to the Emergency Department and triage nurse.  Should you have questions after your visit or need to cancel or reschedule your appointment, please contact [facility] [phone]  and follow the prompts.  Office hours are 8:00 a.m. to 4:30  p.m. Monday - Friday. Please note that voicemails left after 4:00 p.m. may not be returned until the following business day.  We are closed weekends and major holidays. You have access to a nurse at all times for urgent questions. Please call the main number to the clinic [phone] and follow the prompts.  For any non-urgent questions, you may also contact your provider using MyChart. We now offer e-Visits for anyone 52 and older to request care online for non-urgent symptoms. For details visit [email or web address].   Also download the MyChart  app! Go to the app store, search "MyChart", open the app, select [place], and log in with your MyChart username and password.

## 2023-05-27 ENCOUNTER — Inpatient Hospital Stay (HOSPITAL_BASED_OUTPATIENT_CLINIC_OR_DEPARTMENT_OTHER): Payer: Medicare Other | Admitting: Internal Medicine

## 2023-05-27 VITALS — BP 112/64 | HR 77 | Temp 97.7°F | Resp 20 | Wt 161.4 lb

## 2023-05-27 DIAGNOSIS — C7931 Secondary malignant neoplasm of brain: Secondary | ICD-10-CM | POA: Diagnosis not present

## 2023-05-27 DIAGNOSIS — C7951 Secondary malignant neoplasm of bone: Secondary | ICD-10-CM | POA: Diagnosis not present

## 2023-05-27 DIAGNOSIS — G96198 Other disorders of meninges, not elsewhere classified: Secondary | ICD-10-CM

## 2023-05-27 DIAGNOSIS — G629 Polyneuropathy, unspecified: Secondary | ICD-10-CM | POA: Diagnosis not present

## 2023-05-27 DIAGNOSIS — Z5112 Encounter for antineoplastic immunotherapy: Secondary | ICD-10-CM | POA: Diagnosis not present

## 2023-05-27 DIAGNOSIS — C50911 Malignant neoplasm of unspecified site of right female breast: Secondary | ICD-10-CM | POA: Diagnosis not present

## 2023-05-27 DIAGNOSIS — Z171 Estrogen receptor negative status [ER-]: Secondary | ICD-10-CM | POA: Diagnosis not present

## 2023-05-27 NOTE — Progress Notes (Signed)
The [facility] at [facility] [address]  [name], [place] [address] [phone]   New Patient Evaluation  Date of Service: 05/27/23 Patient Name: Denise [name] Patient MRN: [id number] Patient DOB: 22-Mar-1951 Provider: Henreitta Leber, MD  Identifying Statement:  Denise [name] is a 72 y.o. female with Metastasis to brain [facility])  Leptomeningeal disease who presents for initial consultation and evaluation regarding cancer associated neurologic deficits.    Referring Provider: Dorothy Puffer, MD [address] [place],  [place] [address]  Primary Cancer:  Oncologic History: Oncology History  Invasive lobular carcinoma of left breast in female [facility])  01/23/2021 Initial Diagnosis   Invasive lobular carcinoma of left breast in female [facility])   02/19/2021 Genetic Testing   Negative genetic testing on the CancerNext-Expanded+RNAinsight panel.  SMARCB1 VUS identified.  The CancerNext-Expanded gene panel offered by [facility] and includes sequencing and rearrangement analysis for the following 77 genes: AIP, ALK, APC*, ATM*, AXIN2, BAP1, BARD1, BLM, BMPR1A, BRCA1*, BRCA2*, BRIP1*, CDC73, CDH1*, CDK4, CDKN1B, CDKN2A, CHEK2*, CTNNA1, DICER1, FANCC, FH, FLCN, GALNT12, KIF1B, LZTR1, MAX, MEN1, MET, MLH1*, MSH2*, MSH3, MSH6*, MUTYH*, NBN, NF1*, NF2, NTHL1, PALB2*, PHOX2B, PMS2*, POT1, PRKAR1A, PTCH1, PTEN*, RAD51C*, RAD51D*, RB1, RECQL, RET, SDHA, SDHAF2, SDHB, SDHC, SDHD, SMAD4, SMARCA4, SMARCB1, SMARCE1, STK11, SUFU, TMEM127, TP53*, TSC1, TSC2, VHL and XRCC2 (sequencing and deletion/duplication); EGFR, EGLN1, HOXB13, KIT, MITF, PDGFRA, POLD1, and POLE (sequencing only); EPCAM and GREM1 (deletion/duplication only). DNA and RNA analyses performed for * genes. The report date is February 19, 2021.   02/27/2021 - 05/11/2022 Chemotherapy   Patient is on Treatment Plan : BREAST Pembrolizumab + Carboplatin D1,8,15+ Paclitaxel D1,8,15 q21d X 4 cycles / Pembrolizumab + AC q21d x 4  cycles     12/22/2021 Cancer Staging   Staging form: Breast, AJCC 8th Edition - Pathologic stage from 12/22/2021: No Stage Recommended (ypT3, pN3a, cM0, G2, ER-, PR-, HER2-) - Signed by [name], MD on 12/22/2021 Histopathologic type: Lobular carcinoma, NOS Stage prefix: Post-therapy Method of lymph node assessment: Axillary lymph node dissection Multigene prognostic tests performed: None Histologic grading system: 3 grade system   08/18/2022 - 10/08/2022 Chemotherapy   Patient is on Treatment Plan : BREAST METASTATIC Sacituzumab govitecan-hziy Drinda Butts) D1,8 q21d     11/04/2022 -  Chemotherapy   Patient is on Treatment Plan : BREAST METASTATIC Fam-Trastuzumab Deruxtecan-nxki (Enhertu) (5.4) q21d      CNS Oncologic History 06/16/22: SRS to 3 metastases [name])  History of Present Illness: The patient's records from the referring physician were obtained and reviewed and the patient interviewed to confirm this HPI.  Denise [name] presents today for follow up after abnormal MRI findings on recent brain MRI study.  She denies any neurologic symptoms, though has noticed some tremulousness in her hands, and a little bit increased diffuculty with her balloon-tying hobby.  Is having some morning headaches, tylenol and excedrin have been effective.  Continues on Enhertu treatments at [facility].    Medications: Current Outpatient Medications on File Prior to Visit  Medication Sig Dispense Refill   Acetaminophen (TYLENOL PO) Take 1 tablet by mouth as needed.     gabapentin (NEURONTIN) 300 MG capsule Take 1 capsule by mouth 2 (two) times daily.     hydrALAZINE (APRESOLINE) 25 MG tablet Take 25 mg by mouth 3 (three) times daily.     Lactulose 20 GM/30ML SOLN Take 15 mLs (10 g total) by mouth at bedtime as needed. 450 mL 0   levothyroxine (  SYNTHROID) 75 MCG tablet Take 75 mcg by mouth daily before breakfast.     lidocaine (XYLOCAINE) 2 % solution Use as directed 15 mLs in the mouth or  throat as needed (reflux). 100 mL 2   Melatonin 5 MG TABS Take 10 mg by mouth. As needed for sleep     metoCLOPramide (REGLAN) 10 MG tablet Take 1 tablet (10 mg total) by mouth every 6 (six) hours as needed for nausea or vomiting. 30 tablet 0   midodrine (PROAMATINE) 10 MG tablet Take 10 mg by mouth daily.     olmesartan-hydrochlorothiazide (BENICAR HCT) 40-25 MG tablet Take 1 tablet by mouth daily.     pantoprazole (PROTONIX) 40 MG tablet Take 1 tablet (40 mg total) by mouth daily. 60 tablet 3   promethazine (PHENERGAN) 25 MG suppository Place 1 suppository (25 mg total) rectally every 6 (six) hours as needed for nausea or vomiting. 12 each 0   sucralfate (CARAFATE) 1 g tablet Take 1 tablet (1 g total) by mouth every 4 (four) hours as needed. 180 tablet 0   No current facility-administered medications on file prior to visit.    Allergies:  Allergies  Allergen Reactions   Exforge [Amlodipine Besylate-Valsartan] Nausea And Vomiting   Gadolinium Derivatives Anaphylaxis, Hives and Itching    Pt should not have MRI contrast in outpt facility. Pt developed hives and itching AFTER taking 13 hr prep. Per Dr. Charise Killian. 04/23/23   Gadopiclenol Hives and Itching    13 hr prep prior to contrast admin    Tape Other (See Comments)    Redness and Irriatation   Cheese     Hard cheese   Levofloxacin In D5w Hives   Other    Proanthocyanidin    Strawberry Extract Diarrhea    Seeds,nuts, lettuce, grapes   Wild Lettuce Extract (Lactuca Virosa)    Yeast-Derived Drug Products Hives    Mold on bread   Past Medical History:  Past Medical History:  Diagnosis Date   Asthma    as child   Cancer (HCC) 12/2020   left breast IMC   Complication of anesthesia    pateitn states,' I coded when I had my D&Cmany years ago.   Dyspnea    Family history of breast cancer    Hypertension    Hypothyroidism    PONV (postoperative nausea and vomiting)    Port-A-Cath in place 02/26/2021   Past Surgical History:   Past Surgical History:  Procedure Laterality Date   ABDOMINAL HYSTERECTOMY     BIOPSY  01/17/2018   Procedure: BIOPSY;  Surgeon: Malissa Hippo, MD;  [facility];  Service: Endoscopy;;  duodenum,gastric   CHOLECYSTECTOMY     COLONOSCOPY WITH PROPOFOL N/A 01/17/2018   Procedure: COLONOSCOPY WITH PROPOFOL;  Surgeon: Malissa Hippo, MD;  [facility]: [facility];  Service: Endoscopy;  Laterality: N/A;  7:30   DILATION AND CURETTAGE OF UTERUS     ESOPHAGOGASTRODUODENOSCOPY (EGD) WITH PROPOFOL N/A 01/17/2018   Procedure: ESOPHAGOGASTRODUODENOSCOPY (EGD) WITH PROPOFOL;  Surgeon: Malissa Hippo, MD;  [facility];  Service: Endoscopy;  Laterality: N/A;   POLYPECTOMY  01/17/2018   Procedure: POLYPECTOMY;  Surgeon: Malissa Hippo, MD;  [facility];  Service: Endoscopy;;  transverse colon, cecal   PORTACATH PLACEMENT Right 02/17/2021   Procedure: INSERTION PORT-A-CATH;  Surgeon: Abigail Miyamoto, MD;  Location: [facility];  Service: General;  Laterality: Right;   RADIOACTIVE SEED GUIDED AXILLARY SENTINEL LYMPH NODE Left  11/20/2021   Procedure: RADIOACTIVE SEED GUIDED LEFT AXILLARY SENTINEL LYMPH NODE DISSECTION;  Surgeon: Abigail Miyamoto, MD;  Location: [facility];  Service: General;  Laterality: Left;   TOTAL MASTECTOMY Left 11/20/2021   Procedure: LEFT TOTAL MASTECTOMY;  Surgeon: Abigail Miyamoto, MD;  Location: [facility];  Service: General;  Laterality: Left;   Social History:  Social History   Socioeconomic History   Marital status: Widowed    Spouse name: Not on file   Number of children: 3   Years of education: Not on file   Highest education level: Not on file  Occupational History   Occupation: [employer]   Occupation: retired  Tobacco Use   Smoking status: Never   Smokeless tobacco: Never  Vaping Use   Vaping status: Never Used  Substance and Sexual Activity   Alcohol use: Never    Drug use: Never   Sexual activity: Not Currently    Birth control/protection: Surgical  Other Topics Concern   Not on file  Social History Narrative   Not on file   Social Determinants of Health   Financial Resource Strain: Low Risk  (07/10/2022)   Overall Financial Resource Strain (CARDIA)    Difficulty of Paying Living Expenses: Not hard at all  Food Insecurity: No Food Insecurity (06/30/2022)   Hunger Vital Sign    Worried About Running Out of Food in the Last Year: Never true    Ran Out of Food in the Last Year: Never true  Transportation Needs: No Transportation Needs (07/10/2022)   PRAPARE - Administrator, Civil Service (Medical): No    Lack of Transportation (Non-Medical): No  Physical Activity: Insufficiently Active (01/22/2021)   Exercise Vital Sign    Days of Exercise per Week: 3 days    Minutes of Exercise per Session: 30 min  Stress: No Stress Concern Present (01/22/2021)   [employer] of Occupational Health - Occupational Stress Questionnaire    Feeling of Stress : Not at all  Social Connections: Moderately Integrated (01/22/2021)   Social Connection and Isolation Panel [NHANES]    Frequency of Communication with Friends and Family: More than three times a week    Frequency of Social Gatherings with Friends and Family: More than three times a week    Attends Religious Services: More than 4 times per year    Active Member of [employer] or Organizations: No    Attends Engineer, structural: More than 4 times per year    Marital Status: Widowed  Intimate Partner Violence: Not At Risk (05/28/2022)   Humiliation, Afraid, Rape, and Kick questionnaire    Fear of Current or Ex-Partner: No    Emotionally Abused: No    Physically Abused: No    Sexually Abused: No   Family History:  Family History  Problem Relation Age of Onset   Breast cancer Mother        dx in her 39s   Heart disease Mother    Thyroid disease Mother    Lung cancer Father         dx in his 86s   Thyroid disease Maternal Aunt    Thyroid nodules Maternal Grandmother 35       goiter   Thyroid disease Maternal Grandmother    Heart disease Maternal Grandfather    Heart disease Paternal Grandmother    Heart disease Paternal Grandfather    Thyroid disease Daughter    Thyroid disease Daughter  Cancer Maternal Uncle        NOS   Cancer Paternal Uncle        NOS   Breast cancer Cousin        pat first cousin died in her 72s;     Review of Systems: Constitutional: Doesn't report fevers, chills or abnormal weight loss Eyes: Doesn't report blurriness of vision Ears, nose, mouth, throat, and face: Doesn't report sore throat Respiratory: Doesn't report cough, dyspnea or wheezes Cardiovascular: Doesn't report palpitation, chest discomfort  Gastrointestinal:  Doesn't report nausea, constipation, diarrhea GU: Doesn't report incontinence Skin: Doesn't report skin rashes Neurological: Per HPI Musculoskeletal: Doesn't report joint pain Behavioral/Psych: Doesn't report anxiety  Physical Exam: Vitals:   05/27/23 0941  BP: 112/64  Pulse: 77  Resp: 20  Temp: 97.7 F (36.5 C)  SpO2: 98%   KPS: 90. General: Alert, cooperative, pleasant, in no acute distress Head: Normal EENT: No conjunctival injection or scleral icterus.  Lungs: Resp effort normal Cardiac: Regular rate Abdomen: Non-distended abdomen Skin: No rashes cyanosis or petechiae. Extremities: No clubbing or edema  Neurologic Exam: Mental Status: Awake, alert, attentive to examiner. Oriented to self and environment. Language is fluent with intact comprehension.  Cranial Nerves: Visual acuity is grossly normal. Visual fields are full. Extra-ocular movements intact. No ptosis. Face is symmetric Motor: Tone and bulk are normal. Power is full in both arms and legs. Reflexes are symmetric, no pathologic reflexes present.  Sensory: Intact to light touch Gait: Normal.   Labs: I have reviewed the data as  listed    Component Value Date/Time   NA 136 05/24/2023 1300   K 3.6 05/24/2023 1300   CL 108 05/24/2023 1300   CO2 23 05/24/2023 1300   GLUCOSE 116 (H) 05/24/2023 1300   BUN 18 05/24/2023 1300   CREATININE 0.70 05/24/2023 1300   CALCIUM 8.2 (L) 05/24/2023 1300   PROT 6.8 05/24/2023 1300   ALBUMIN 3.6 05/24/2023 1300   AST 17 05/24/2023 1300   ALT 14 05/24/2023 1300   ALKPHOS 111 05/24/2023 1300   BILITOT 0.6 05/24/2023 1300   GFRNONAA >60 05/24/2023 1300   GFRAA >60 01/13/2018 1438   Lab Results  Component Value Date   WBC 4.9 05/24/2023   NEUTROABS 3.0 05/24/2023   HGB 10.8 (L) 05/24/2023   HCT 32.6 (L) 05/24/2023   MCV 109.8 (H) 05/24/2023   PLT 252 05/24/2023    Imaging:  DG FL GUIDED LUMBAR PUNCTURE  Result Date: 05/19/2023 CLINICAL DATA:  Right frontal lobe lesion. History of breast carcinoma. EXAM: LUMBAR PUNCTURE UNDER FLUOROSCOPY FLUOROSCOPY: Radiation Exposure Index (as provided by the fluoroscopic device): 1.8 mGy air Kerma TECHNIQUE: An appropriate skin entry site was determined fluoroscopically. Operator donned sterile gloves and mask. Skin site was marked, then prepped with Betadine, draped in usual sterile fashion, and infiltrated locally with 1% lidocaine. A 20 gauge spinal needle advanced into the thecal sac at L2-3 from a left interlaminar approach. Clear colorless CSF spontaneously returned . 14ml CSF were collected and divided among 4 sterile vials for the requested laboratory studies. The needle was then removed. COMPLICATIONS: None immediate IMPRESSION: 1. Technically successful lumbar puncture under fluoroscopy. Electronically Signed   By: Corlis Leak M.D.   On: 05/19/2023 10:48    [facility] Clinician Interpretation: I have personally reviewed the radiological images as listed.  My interpretation, in the context of the patient's clinical presentation, is progressive disease c/w leptomeningeal dissemination   Assessment/Plan Metastasis to brain  [facility])  Leptomeningeal  disease  ELY BONIFAS presents with clinical and radiographic syndrome c/w leptomeningeal dissemination of primary breast cancer.  This is supported by pattern of novel enhancement on brain MRI, as well as atypical cells identified in the CSF.    We discussed the pathophysiology of LMD and the prognosis.  She understands this is an incurable situation with regards to CNS tumor burden.  Treatments can potentially prolong time to progression.  She is agreeable with WBRT given multifocal disease, lack of any spine symptoms.   Will defer completing staging with total spine MRI based on severe gadolinium allergy despite pre-treatment with predisone and benadryl.  Case will be discussed in detail in CNS tumor board this week, including discussion of gadolinium alternatives, if available.      We spent twenty additional minutes teaching regarding the natural history, biology, and historical experience in the treatment of neurologic complications of cancer.   We appreciate the opportunity to participate in the care of Denise [name].   All questions were answered. The patient knows to call the clinic with any problems, questions or concerns. No barriers to learning were detected.  The total time spent in the encounter was 60 minutes and more than 50% was on counseling and review of test results   Henreitta Leber, MD Medical Director of Neuro-Oncology [facility] at [facility] 05/27/23 9:50 AM

## 2023-05-31 ENCOUNTER — Inpatient Hospital Stay
Admission: RE | Admit: 2023-05-31 | Discharge: 2023-05-31 | Disposition: A | Discharge status: Home / Self Care | Payer: Medicare Other | Source: Ambulatory Visit | Attending: Radiation Oncology | Admitting: Radiation Oncology

## 2023-05-31 ENCOUNTER — Inpatient Hospital Stay: Payer: Medicare Other

## 2023-05-31 ENCOUNTER — Telehealth: Payer: Self-pay | Admitting: Radiation Oncology

## 2023-05-31 NOTE — Telephone Encounter (Signed)
I called the patient and let her know her case was discussed in brain oncology conference and recommendations were for her to proceed with whole brain radiation over 10 fractions. We reviewed that she had difficulty with tolerating contrast and Dr. Barbaraann Cao is working with radiology to see what the best way of imaging her spine would be given her contrast allergy. We also discussed the risks, benefits, short and long term effects of whole brain radiation. She is open to proceed. The patient will be contacted to coordinate treatment planning by our simulation department.

## 2023-05-31 NOTE — Progress Notes (Signed)
Today's telephone apt. canceled.

## 2023-06-01 ENCOUNTER — Ambulatory Visit
Admission: RE | Admit: 2023-06-01 | Discharge: 2023-06-01 | Disposition: A | Discharge status: Home / Self Care | Payer: Medicare Other | Source: Ambulatory Visit | Attending: Radiation Oncology | Admitting: Radiation Oncology

## 2023-06-01 DIAGNOSIS — Z170 Estrogen receptor positive status [ER+]: Secondary | ICD-10-CM | POA: Insufficient documentation

## 2023-06-01 DIAGNOSIS — C7951 Secondary malignant neoplasm of bone: Secondary | ICD-10-CM | POA: Diagnosis not present

## 2023-06-01 DIAGNOSIS — C50912 Malignant neoplasm of unspecified site of left female breast: Secondary | ICD-10-CM | POA: Diagnosis not present

## 2023-06-01 DIAGNOSIS — Z510 Encounter for antineoplastic radiation therapy: Secondary | ICD-10-CM | POA: Insufficient documentation

## 2023-06-01 DIAGNOSIS — C7931 Secondary malignant neoplasm of brain: Secondary | ICD-10-CM | POA: Diagnosis not present

## 2023-06-04 DIAGNOSIS — C7951 Secondary malignant neoplasm of bone: Secondary | ICD-10-CM | POA: Diagnosis not present

## 2023-06-04 DIAGNOSIS — C7931 Secondary malignant neoplasm of brain: Secondary | ICD-10-CM | POA: Diagnosis not present

## 2023-06-04 DIAGNOSIS — Z170 Estrogen receptor positive status [ER+]: Secondary | ICD-10-CM | POA: Diagnosis not present

## 2023-06-04 DIAGNOSIS — C50912 Malignant neoplasm of unspecified site of left female breast: Secondary | ICD-10-CM | POA: Diagnosis not present

## 2023-06-04 DIAGNOSIS — Z510 Encounter for antineoplastic radiation therapy: Secondary | ICD-10-CM | POA: Diagnosis not present

## 2023-06-08 ENCOUNTER — Encounter (HOSPITAL_COMMUNITY): Payer: Self-pay | Admitting: Radiology

## 2023-06-08 ENCOUNTER — Inpatient Hospital Stay: Payer: Medicare Other

## 2023-06-08 ENCOUNTER — Ambulatory Visit (HOSPITAL_COMMUNITY)
Admission: RE | Admit: 2023-06-08 | Discharge: 2023-06-08 | Disposition: A | Discharge status: Home / Self Care | Payer: Medicare Other | Source: Ambulatory Visit | Attending: Hematology | Admitting: Hematology

## 2023-06-08 DIAGNOSIS — Z95828 Presence of other vascular implants and grafts: Secondary | ICD-10-CM

## 2023-06-08 DIAGNOSIS — C50911 Malignant neoplasm of unspecified site of right female breast: Secondary | ICD-10-CM | POA: Diagnosis not present

## 2023-06-08 DIAGNOSIS — Z171 Estrogen receptor negative status [ER-]: Secondary | ICD-10-CM | POA: Diagnosis not present

## 2023-06-08 DIAGNOSIS — Z5112 Encounter for antineoplastic immunotherapy: Secondary | ICD-10-CM | POA: Diagnosis not present

## 2023-06-08 DIAGNOSIS — G629 Polyneuropathy, unspecified: Secondary | ICD-10-CM | POA: Diagnosis not present

## 2023-06-08 DIAGNOSIS — C50912 Malignant neoplasm of unspecified site of left female breast: Secondary | ICD-10-CM | POA: Diagnosis not present

## 2023-06-08 DIAGNOSIS — C7951 Secondary malignant neoplasm of bone: Secondary | ICD-10-CM | POA: Diagnosis not present

## 2023-06-08 DIAGNOSIS — C7931 Secondary malignant neoplasm of brain: Secondary | ICD-10-CM | POA: Diagnosis not present

## 2023-06-08 DIAGNOSIS — K5730 Diverticulosis of large intestine without perforation or abscess without bleeding: Secondary | ICD-10-CM | POA: Diagnosis not present

## 2023-06-08 DIAGNOSIS — R918 Other nonspecific abnormal finding of lung field: Secondary | ICD-10-CM | POA: Diagnosis not present

## 2023-06-08 LAB — CBC WITH DIFFERENTIAL/PLATELET
Abs Immature Granulocytes: 0.01 10*3/uL (ref 0.00–0.07)
Basophils Absolute: 0 10*3/uL (ref 0.0–0.1)
Basophils Relative: 0 %
Eosinophils Absolute: 0.1 10*3/uL (ref 0.0–0.5)
Eosinophils Relative: 3 %
HCT: 30.4 % — ABNORMAL LOW (ref 36.0–46.0)
Hemoglobin: 9.7 g/dL — ABNORMAL LOW (ref 12.0–15.0)
Immature Granulocytes: 0 %
Lymphocytes Relative: 18 %
Lymphs Abs: 0.7 10*3/uL (ref 0.7–4.0)
MCH: 34.8 pg — ABNORMAL HIGH (ref 26.0–34.0)
MCHC: 31.9 g/dL (ref 30.0–36.0)
MCV: 109 fL — ABNORMAL HIGH (ref 80.0–100.0)
Monocytes Absolute: 0.3 10*3/uL (ref 0.1–1.0)
Monocytes Relative: 8 %
Neutro Abs: 2.5 10*3/uL (ref 1.7–7.7)
Neutrophils Relative %: 71 %
Platelets: 225 10*3/uL (ref 150–400)
RBC: 2.79 MIL/uL — ABNORMAL LOW (ref 3.87–5.11)
RDW: 13.9 % (ref 11.5–15.5)
WBC: 3.5 10*3/uL — ABNORMAL LOW (ref 4.0–10.5)
nRBC: 0 % (ref 0.0–0.2)

## 2023-06-08 LAB — COMPREHENSIVE METABOLIC PANEL
ALT: 14 U/L (ref 0–44)
AST: 17 U/L (ref 15–41)
Albumin: 3.4 g/dL — ABNORMAL LOW (ref 3.5–5.0)
Alkaline Phosphatase: 122 U/L (ref 38–126)
Anion gap: 9 (ref 5–15)
BUN: 13 mg/dL (ref 8–23)
CO2: 22 mmol/L (ref 22–32)
Calcium: 8.4 mg/dL — ABNORMAL LOW (ref 8.9–10.3)
Chloride: 104 mmol/L (ref 98–111)
Creatinine, Ser: 0.65 mg/dL (ref 0.44–1.00)
GFR, Estimated: >60 mL/min (ref >60)
Glucose, Bld: 88 mg/dL (ref 70–99)
Potassium: 3.7 mmol/L (ref 3.5–5.1)
Sodium: 135 mmol/L (ref 135–145)
Total Bilirubin: 0.6 mg/dL (ref 0.3–1.2)
Total Protein: 6.3 g/dL — ABNORMAL LOW (ref 6.5–8.1)

## 2023-06-08 LAB — MAGNESIUM: Magnesium: 1.7 mg/dL (ref 1.7–2.4)

## 2023-06-08 MED ORDER — HEPARIN SOD (PORK) LOCK FLUSH 100 UNIT/ML IV SOLN
INTRAVENOUS | Status: AC
  Filled 2023-06-08: qty 5

## 2023-06-08 MED ORDER — SODIUM CHLORIDE 0.9% FLUSH
10.0000 mL | INTRAVENOUS | Status: AC
  Administered 2023-06-08: 10 mL

## 2023-06-08 MED ORDER — IOHEXOL 300 MG/ML  SOLN: 100.0000 mL | Freq: Once | INTRAMUSCULAR | Status: DC | PRN

## 2023-06-08 MED ORDER — IOHEXOL 300 MG/ML  SOLN
100.0000 mL | Freq: Once | INTRAMUSCULAR | Status: AC | PRN
  Administered 2023-06-08: 100 mL via INTRAVENOUS

## 2023-06-08 MED ORDER — HEPARIN SOD (PORK) LOCK FLUSH 100 UNIT/ML IV SOLN
500.0000 [IU] | Freq: Once | INTRAVENOUS | Status: AC
  Administered 2023-06-08: 500 [IU] via INTRAVENOUS

## 2023-06-08 NOTE — Progress Notes (Signed)
Denise [name] presented for Portacath access and flush with lab work.  Portacath located righ chest wall accessed with  H 20 needle.  Good blood return present. Portacath flushed with 20ml NS and 500U/27ml Heparin and needle removed intact.  Procedure tolerated well and without incident.  Discharged from clinic ambulatory in stable condition. Alert and oriented x 3. F/U with [facility] as scheduled.

## 2023-06-09 ENCOUNTER — Ambulatory Visit: Payer: Medicare Other | Admitting: Radiation Oncology

## 2023-06-10 ENCOUNTER — Ambulatory Visit: Payer: Medicare Other

## 2023-06-10 ENCOUNTER — Ambulatory Visit: Payer: Medicare Other | Admitting: Radiation Oncology

## 2023-06-10 DIAGNOSIS — I952 Hypotension due to drugs: Secondary | ICD-10-CM | POA: Diagnosis not present

## 2023-06-10 DIAGNOSIS — C50919 Malignant neoplasm of unspecified site of unspecified female breast: Secondary | ICD-10-CM | POA: Diagnosis not present

## 2023-06-10 DIAGNOSIS — I10 Essential (primary) hypertension: Secondary | ICD-10-CM | POA: Diagnosis not present

## 2023-06-10 DIAGNOSIS — K219 Gastro-esophageal reflux disease without esophagitis: Secondary | ICD-10-CM | POA: Diagnosis not present

## 2023-06-10 DIAGNOSIS — E038 Other specified hypothyroidism: Secondary | ICD-10-CM | POA: Diagnosis not present

## 2023-06-11 ENCOUNTER — Ambulatory Visit: Payer: Medicare Other

## 2023-06-14 ENCOUNTER — Inpatient Hospital Stay: Payer: Medicare Other

## 2023-06-14 ENCOUNTER — Inpatient Hospital Stay (HOSPITAL_BASED_OUTPATIENT_CLINIC_OR_DEPARTMENT_OTHER): Payer: Medicare Other | Admitting: Hematology

## 2023-06-14 ENCOUNTER — Ambulatory Visit: Payer: Medicare Other

## 2023-06-14 VITALS — BP 114/70 | HR 66 | Temp 98.7°F | Resp 18

## 2023-06-14 DIAGNOSIS — C50912 Malignant neoplasm of unspecified site of left female breast: Secondary | ICD-10-CM

## 2023-06-14 DIAGNOSIS — Z95828 Presence of other vascular implants and grafts: Secondary | ICD-10-CM

## 2023-06-14 DIAGNOSIS — G629 Polyneuropathy, unspecified: Secondary | ICD-10-CM | POA: Diagnosis not present

## 2023-06-14 DIAGNOSIS — Z171 Estrogen receptor negative status [ER-]: Secondary | ICD-10-CM | POA: Diagnosis not present

## 2023-06-14 DIAGNOSIS — Z5112 Encounter for antineoplastic immunotherapy: Secondary | ICD-10-CM | POA: Diagnosis not present

## 2023-06-14 DIAGNOSIS — C7951 Secondary malignant neoplasm of bone: Secondary | ICD-10-CM | POA: Diagnosis not present

## 2023-06-14 DIAGNOSIS — C50911 Malignant neoplasm of unspecified site of right female breast: Secondary | ICD-10-CM | POA: Diagnosis not present

## 2023-06-14 DIAGNOSIS — C7931 Secondary malignant neoplasm of brain: Secondary | ICD-10-CM | POA: Diagnosis not present

## 2023-06-14 MED ORDER — CETIRIZINE HCL 10 MG/ML IV SOLN
10.0000 mg | Freq: Once | INTRAVENOUS | Status: AC
  Administered 2023-06-14: 10 mg via INTRAVENOUS
  Filled 2023-06-14: qty 1

## 2023-06-14 MED ORDER — ACETAMINOPHEN 325 MG PO TABS
650.0000 mg | ORAL_TABLET | Freq: Once | ORAL | Status: AC
  Administered 2023-06-14: 650 mg via ORAL
  Filled 2023-06-14: qty 2

## 2023-06-14 MED ORDER — DEXTROSE 5 % IV SOLN: Freq: Once | INTRAVENOUS | Status: AC

## 2023-06-14 MED ORDER — FAM-TRASTUZUMAB DERUXTECAN-NXKI CHEMO 100 MG IV SOLR
4.2000 mg/kg | Freq: Once | INTRAVENOUS | Status: AC
  Administered 2023-06-14: 300 mg via INTRAVENOUS
  Filled 2023-06-14: qty 15

## 2023-06-14 MED ORDER — SODIUM CHLORIDE 0.9 % IV SOLN
150.0000 mg | Freq: Once | INTRAVENOUS | Status: AC
  Administered 2023-06-14: 150 mg via INTRAVENOUS
  Filled 2023-06-14: qty 150

## 2023-06-14 MED ORDER — SODIUM CHLORIDE 0.9 % IV SOLN
10.0000 mg | Freq: Once | INTRAVENOUS | Status: AC
  Administered 2023-06-14: 10 mg via INTRAVENOUS
  Filled 2023-06-14: qty 10

## 2023-06-14 MED ORDER — PALONOSETRON HCL INJECTION 0.25 MG/5ML
0.2500 mg | Freq: Once | INTRAVENOUS | Status: AC
  Administered 2023-06-14: 0.25 mg via INTRAVENOUS
  Filled 2023-06-14: qty 5

## 2023-06-14 MED ORDER — SODIUM CHLORIDE 0.9% FLUSH
10.0000 mL | INTRAVENOUS | Status: DC | PRN
  Administered 2023-06-14: 10 mL

## 2023-06-14 MED ORDER — HEPARIN SOD (PORK) LOCK FLUSH 100 UNIT/ML IV SOLN
500.0000 [IU] | Freq: Once | INTRAVENOUS | Status: AC | PRN
  Administered 2023-06-14: 500 [IU]

## 2023-06-14 NOTE — Progress Notes (Signed)
Enhertu given today per MD orders. Tolerated infusion without adverse affects. Vital signs stable. No complaints at this time. Discharged from clinic ambulatory in stable condition. Alert and oriented x 3. F/U with [facility] as scheduled.

## 2023-06-14 NOTE — Progress Notes (Signed)
[facility] [address] [place] [address]    Clinic Day:  06/14/2023  Referring physician: Toma Deiters, MD  Patient Care Team: Toma Deiters, MD as PCP - General (Internal Medicine) [name], MD as Medical Oncologist (Medical Oncology)   ASSESSMENT & PLAN:   Assessment: 1.  T3N3c (stage IIIc) triple negative invasive lobular carcinoma of the left breast: - She felt lump in her left breast for more than 6 months, but thought it was secondary to fibrocystic disease which she had all her life.  When she started having pain, she reached out to Dr. Olena Leatherwood. - Ultrasound-guided left breast and left axillary lymph node biopsy on 01/15/2021 - Pathology consistent with invasive lobular carcinoma, E-cadherin negative.  ER/PR/HER2 negative.  HER2 2+ by IHC, negative by FISH.  Ki-67 is 5%.  Lymph node core biopsy was consistent with metastatic carcinoma.  Grade 2. - PET scan on 02/03/2021 showed involvement of left supraclavicular, subpectoral, axillary lymph nodes along with the breast mass.  10 mm left lung nodule which is hypometabolic.  Spinal cord lesion at T12 level with a strong uptake. - MRI of the lumbar spine with and without contrast on 02/20/2021 showed no mass or abnormal enhancement within the canal at the T12 level to correspond to the site of PET scan positive.  No marrow replacing bone lesion. - 12 weeks of carboplatin and paclitaxel weekly and every 3 weeks pembrolizumab followed by 4 cycles of dose dense AC with pembrolizumab ([id number]) from 02/27/2021 through 10/01/2021 - PET scan on 10/16/2021: Reduction in metabolic activity in the size of the left axillary lymph node and no evidence of breast hypermetabolism on PET scan. - Left mastectomy and lymph node excision by Dr. Magnus Ivan on 11/20/2021 - Pathology: 6.2 cm invasive lobular carcinoma, grade 2, margins negative.  19/21 lymph nodes involved with macrometastasis.  ypT3, YPN3A - Adjuvant  pembrolizumab started on 01/01/2022. -CREATE-X capecitabine 1500 mg twice daily 2 weeks on/1 week off started on 01/01/2022.  Last dose of pembrolizumab on 05/11/2022 - Germline mutation testing ([facility]) negative - [employer] (06/04/2022): T p53, K-ras V14 I, CDKN2A.  MSI-high not detected. - PET scan (05/28/2022): Multifocal bone lesions involving T11 vertebral body, right posterior sixth rib, left femoral neck, left scapular glenoid.  No other metastatic disease. - Sacituzumab cycle 1 on 08/18/2022.  She was hospitalized with neutropenic fever and severe diarrhea from 08/24/2022 through 08/27/2022. - CTAP (10/25/2021): Generalized mild to moderate wall thickening throughout the nondistended large bowel with faint pericolonic fat haziness compatible with nonspecific infectious/inflammatory colitis.  No free air.  C. difficile x 2 was negative. - She has UG T1 A1*28 heterozygosity. - Cycle 2 Sacituzumab dose reduced by 50%.  Discontinued secondary to poor tolerance. - Enhertu 3.2 Mg/KG cycle 1 on 11/04/2022   2.  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  She is non-smoker. - Mother died of breast cancer.  Maternal grandmother died very young in her 70s, sister has fibrocystic disease.  Father died of lung cancer and was a smoker.    Plan: 1.  Metastatic TNBC to the bones: - She is tolerating Enhertu reasonably well. - CT CAP (06/08/2023): Left iliac wing lytic metastasis slightly increased in size measuring 2.4 cm, previously 2.1 cm.  Slightly increased lytic character of small meta stasis of the right iliac crest.  No evidence of visceral metastatic disease. - CA 20 7.29 has increased to  55.3.  CA 15-3 is stable. - Overall mild progression with slight increase in 1-2 bone metastasis. - We discussed treatment options.  Next best option is systemic chemotherapy with agents including eribulin/ixabepilone.  She had difficulty tolerating  Sacituzumab.  We also discussed continuing Enhertu at this time and repeating scans after 2-3 cycles based on symptoms. - I have also recommended obtaining a bone scan as baseline. - Upon discussion with the patient, we will continue Enhertu at this time.  She will proceed with treatment today.  Labs today shows normal LFTs and creatinine.  CBC grossly normal.  RTC 3 weeks for follow-up.  2.  Brain lesions (SRS to 3 brain lesions on 06/16/2022): - MRI brain (04/22/2021): New areas of subarachnoid based enhancement suggesting interval leptomeningeal disease.  Pre-existing parenchymal deposits in the right frontal lobe unchanged.  Modest enlargement of right occipital bone metastasis. - Lumbar puncture (05/19/2023): Cytology with rare large atypical cells.  Total protein and glucose was normal. - She met with Dr. Barbaraann Cao who has recommended WBRT. - She will start WBRT on 06/15/2023.  3.  Peripheral neuropathy: - Continue gabapentin 300 mg at bedtime.  Symptoms well-controlled.  4.  Hypomagnesemia: - Continue magnesium 1 tablet daily.  Magnesium is normal at 1.7.    Orders Placed This Encounter  Procedures   NM Bone Scan Whole Body    Standing Status:   Standing    Number of Occurrences:   1    Order Specific Question:   If indicated for the ordered procedure, I authorize the administration of a radiopharmaceutical per Radiology protocol    Answer:   Yes    Order Specific Question:   Can the patient sign their own consent?    Answer:   No   NM Bone Scan Whole Body    Standing Status:   Future    Standing Expiration Date:   06/13/2024    Order Specific Question:   If indicated for the ordered procedure, I authorize the administration of a radiopharmaceutical per Radiology protocol    Answer:   Yes    Order Specific Question:   Preferred imaging location?    Answer:   [facility]      I,Katie Daubenspeck,acting as a scribe for [name], MD.,have documented all relevant  documentation on the behalf of [name], MD,as directed by  [name], MD while in the presence of [name], MD.   I, [name] MD, have reviewed the above documentation for accuracy and completeness, and I agree with the above.   [name], MD   9/30/20244:27 PM  CHIEF COMPLAINT:   Diagnosis: Metastatic triple negative breast cancer to the bones    Cancer Staging  Invasive lobular carcinoma of left breast in female [facility]) Staging form: Breast, AJCC 8th Edition - Clinical stage from 01/23/2021: cT3, cN3c, G2, ER-, PR-, HER2- - Unsigned - Pathologic stage from 12/22/2021: No Stage Recommended (ypT3, pN3a, cM0, G2, ER-, PR-, HER2-) - Signed by [name], MD on 12/22/2021    Prior Therapy: 1.  Neoadjuvant chemotherapy with weekly carboplatin and paclitaxel and dose dense AC with pembrolizumab 2. Left mastectomy and lymph node excision by Dr. Magnus Ivan on 11/20/2021 3.  Sacituzumab on 08/18/2022 and 10/08/2021, discontinued due to poor tolerance.  Current Therapy:  Enhertu every 21 days    HISTORY OF PRESENT ILLNESS:   Oncology History  Invasive lobular carcinoma of left breast in female [facility])  01/23/2021 Initial Diagnosis   Invasive lobular carcinoma  of left breast in female [facility])   02/19/2021 Genetic Testing   Negative genetic testing on the CancerNext-Expanded+RNAinsight panel.  SMARCB1 VUS identified.  The CancerNext-Expanded gene panel offered by [facility] and includes sequencing and rearrangement analysis for the following 77 genes: AIP, ALK, APC*, ATM*, AXIN2, BAP1, BARD1, BLM, BMPR1A, BRCA1*, BRCA2*, BRIP1*, CDC73, CDH1*, CDK4, CDKN1B, CDKN2A, CHEK2*, CTNNA1, DICER1, FANCC, FH, FLCN, GALNT12, KIF1B, LZTR1, MAX, MEN1, MET, MLH1*, MSH2*, MSH3, MSH6*, MUTYH*, NBN, NF1*, NF2, NTHL1, PALB2*, PHOX2B, PMS2*, POT1, PRKAR1A, PTCH1, PTEN*, RAD51C*, RAD51D*, RB1, RECQL, RET, SDHA, SDHAF2, SDHB, SDHC, SDHD, SMAD4, SMARCA4, SMARCB1,  SMARCE1, STK11, SUFU, TMEM127, TP53*, TSC1, TSC2, VHL and XRCC2 (sequencing and deletion/duplication); EGFR, EGLN1, HOXB13, KIT, MITF, PDGFRA, POLD1, and POLE (sequencing only); EPCAM and GREM1 (deletion/duplication only). DNA and RNA analyses performed for * genes. The report date is February 19, 2021.   02/27/2021 - 05/11/2022 Chemotherapy   Patient is on Treatment Plan : BREAST Pembrolizumab + Carboplatin D1,8,15+ Paclitaxel D1,8,15 q21d X 4 cycles / Pembrolizumab + AC q21d x 4 cycles     12/22/2021 Cancer Staging   Staging form: Breast, AJCC 8th Edition - Pathologic stage from 12/22/2021: No Stage Recommended (ypT3, pN3a, cM0, G2, ER-, PR-, HER2-) - Signed by [name], MD on 12/22/2021 Histopathologic type: Lobular carcinoma, NOS Stage prefix: Post-therapy Method of lymph node assessment: Axillary lymph node dissection Multigene prognostic tests performed: None Histologic grading system: 3 grade system   08/18/2022 - 10/08/2022 Chemotherapy   Patient is on Treatment Plan : BREAST METASTATIC Sacituzumab govitecan-hziy Drinda Butts) D1,8 q21d     11/04/2022 -  Chemotherapy   Patient is on Treatment Plan : BREAST METASTATIC Fam-Trastuzumab Deruxtecan-nxki (Enhertu) (5.4) q21d        INTERVAL HISTORY:   [name] is a 72 y.o. female presenting to clinic today for follow up of Metastatic triple negative breast cancer to the bones. She was last seen by me on 05/24/23.  Since her last visit, she underwent restaging CT C/A/P on 06/08/23 showing: slightly increased lytic character of a small metastasis of right iliac crest; slight increase in size of additional lytic metastasis in left iliac wing; unchanged treated sclerotic vertebral body metastases; no evidence of lymphadenopathy or soft tissue metastatic disease.  Today, she states that she is doing well overall. Her appetite level is at 100%. Her energy level is at 75%.  PAST MEDICAL HISTORY:   Past Medical History: Past Medical History:   Diagnosis Date   Asthma    as child   Cancer (HCC) 12/2020   left breast IMC   Complication of anesthesia    pateitn states,' I coded when I had my D&Cmany years ago.   Dyspnea    Family history of breast cancer    Hypertension    Hypothyroidism    PONV (postoperative nausea and vomiting)    Port-A-Cath in place 02/26/2021    Surgical History: Past Surgical History:  Procedure Laterality Date   ABDOMINAL HYSTERECTOMY     BIOPSY  01/17/2018   Procedure: BIOPSY;  Surgeon: Malissa Hippo, MD;  [facility];  Service: Endoscopy;;  duodenum,gastric   CHOLECYSTECTOMY     COLONOSCOPY WITH PROPOFOL N/A 01/17/2018   Procedure: COLONOSCOPY WITH PROPOFOL;  Surgeon: Malissa Hippo, MD;  Location: [facility];  Service: Endoscopy;  Laterality: N/A;  7:30   DILATION AND CURETTAGE OF UTERUS     ESOPHAGOGASTRODUODENOSCOPY (EGD) WITH PROPOFOL N/A 01/17/2018   Procedure: ESOPHAGOGASTRODUODENOSCOPY (EGD) WITH PROPOFOL;  Surgeon: Lionel December  U, MD;  Location: [facility];  Service: Endoscopy;  Laterality: N/A;   POLYPECTOMY  01/17/2018   Procedure: POLYPECTOMY;  Surgeon: Malissa Hippo, MD;  [facility];  Service: Endoscopy;;  transverse colon, cecal   PORTACATH PLACEMENT Right 02/17/2021   Procedure: INSERTION PORT-A-CATH;  Surgeon: Abigail Miyamoto, MD;  Location: [facility];  Service: General;  Laterality: Right;   RADIOACTIVE SEED GUIDED AXILLARY SENTINEL LYMPH NODE Left 11/20/2021   Procedure: RADIOACTIVE SEED GUIDED LEFT AXILLARY SENTINEL LYMPH NODE DISSECTION;  Surgeon: Abigail Miyamoto, MD;  Location: [facility];  Service: General;  Laterality: Left;   TOTAL MASTECTOMY Left 11/20/2021   Procedure: LEFT TOTAL MASTECTOMY;  Surgeon: Abigail Miyamoto, MD;  Location: [facility];  Service: General;  Laterality: Left;    Social History: Social History   Socioeconomic History   Marital status: Widowed    Spouse name: Not on  file   Number of children: 3   Years of education: Not on file   Highest education level: Not on file  Occupational History   Occupation: [employer]   Occupation: retired  Tobacco Use   Smoking status: Never   Smokeless tobacco: Never  Vaping Use   Vaping status: Never Used  Substance and Sexual Activity   Alcohol use: Never   Drug use: Never   Sexual activity: Not Currently    Birth control/protection: Surgical  Other Topics Concern   Not on file  Social History Narrative   Not on file   Social Determinants of Health   Financial Resource Strain: Low Risk  (07/10/2022)   Overall Financial Resource Strain (CARDIA)    Difficulty of Paying Living Expenses: Not hard at all  Food Insecurity: No Food Insecurity (06/30/2022)   Hunger Vital Sign    Worried About Running Out of Food in the Last Year: Never true    Ran Out of Food in the Last Year: Never true  Transportation Needs: No Transportation Needs (07/10/2022)   PRAPARE - Administrator, Civil Service (Medical): No    Lack of Transportation (Non-Medical): No  Physical Activity: Insufficiently Active (01/22/2021)   Exercise Vital Sign    Days of Exercise per Week: 3 days    Minutes of Exercise per Session: 30 min  Stress: No Stress Concern Present (01/22/2021)   [employer] of Occupational Health - Occupational Stress Questionnaire    Feeling of Stress : Not at all  Social Connections: Moderately Integrated (01/22/2021)   Social Connection and Isolation Panel [NHANES]    Frequency of Communication with Friends and Family: More than three times a week    Frequency of Social Gatherings with Friends and Family: More than three times a week    Attends Religious Services: More than 4 times per year    Active Member of [employer] or Organizations: No    Attends Engineer, structural: More than 4 times per year    Marital Status: Widowed  Intimate Partner Violence: Not At Risk (05/28/2022)    Humiliation, Afraid, Rape, and Kick questionnaire    Fear of Current or Ex-Partner: No    Emotionally Abused: No    Physically Abused: No    Sexually Abused: No    Family History: Family History  Problem Relation Age of Onset   Breast cancer Mother        dx in her 39s   Heart disease Mother    Thyroid disease Mother  Lung cancer Father        dx in his 68s   Thyroid disease Maternal Aunt    Thyroid nodules Maternal Grandmother 35       goiter   Thyroid disease Maternal Grandmother    Heart disease Maternal Grandfather    Heart disease Paternal Grandmother    Heart disease Paternal Grandfather    Thyroid disease Daughter    Thyroid disease Daughter    Cancer Maternal Uncle        NOS   Cancer Paternal Uncle        NOS   Breast cancer Cousin        pat first cousin died in her 15s;     Current Medications:  Current Outpatient Medications:    Acetaminophen (TYLENOL PO), Take 1 tablet by mouth as needed., Disp: , Rfl:    gabapentin (NEURONTIN) 300 MG capsule, Take 1 capsule by mouth 2 (two) times daily., Disp: , Rfl:    hydrALAZINE (APRESOLINE) 25 MG tablet, Take 25 mg by mouth 3 (three) times daily., Disp: , Rfl:    Lactulose 20 GM/30ML SOLN, Take 15 mLs (10 g total) by mouth at bedtime as needed., Disp: 450 mL, Rfl: 0   levothyroxine (SYNTHROID) 75 MCG tablet, Take 75 mcg by mouth daily before breakfast., Disp: , Rfl:    lidocaine (XYLOCAINE) 2 % solution, Use as directed 15 mLs in the mouth or throat as needed (reflux)., Disp: 100 mL, Rfl: 2   Melatonin 5 MG TABS, Take 10 mg by mouth. As needed for sleep, Disp: , Rfl:    metoCLOPramide (REGLAN) 10 MG tablet, Take 1 tablet (10 mg total) by mouth every 6 (six) hours as needed for nausea or vomiting., Disp: 30 tablet, Rfl: 0   midodrine (PROAMATINE) 10 MG tablet, Take 10 mg by mouth daily., Disp: , Rfl:    olmesartan-hydrochlorothiazide (BENICAR HCT) 40-25 MG tablet, Take 1 tablet by mouth daily., Disp: , Rfl:     pantoprazole (PROTONIX) 40 MG tablet, Take 1 tablet (40 mg total) by mouth daily., Disp: 60 tablet, Rfl: 3   promethazine (PHENERGAN) 25 MG suppository, Place 1 suppository (25 mg total) rectally every 6 (six) hours as needed for nausea or vomiting., Disp: 12 each, Rfl: 0   sucralfate (CARAFATE) 1 g tablet, Take 1 tablet (1 g total) by mouth every 4 (four) hours as needed., Disp: 180 tablet, Rfl: 0 No current facility-administered medications for this visit.  Facility-Administered Medications Ordered in Other Visits:    sodium chloride flush (NS) 0.9 % injection 10 mL, 10 mL, Intracatheter, PRN, [name], MD, 10 mL at 06/14/23 1552   Allergies: Allergies  Allergen Reactions   Exforge [Amlodipine Besylate-Valsartan] Nausea And Vomiting   Gadolinium Derivatives Anaphylaxis, Hives and Itching    Pt should not have MRI contrast in outpt facility. Pt developed hives and itching AFTER taking 13 hr prep. Per Dr. Charise Killian. 04/23/23   Gadopiclenol Hives and Itching    13 hr prep prior to contrast admin    Tape Other (See Comments)    Redness and Irriatation   Cheese     Hard cheese   Levofloxacin In D5w Hives   Other    Proanthocyanidin    Strawberry Extract Diarrhea    Seeds,nuts, lettuce, grapes   Wild Lettuce Extract (Lactuca Virosa)    Yeast-Derived Drug Products Hives    Mold on bread    REVIEW OF SYSTEMS:   Review of Systems  Constitutional:  Negative  for chills, fatigue and fever.  HENT:   Positive for sore throat. Negative for lump/mass, mouth sores, nosebleeds and trouble swallowing.   Eyes:  Negative for eye problems.  Respiratory:  Positive for shortness of breath. Negative for cough.   Cardiovascular:  Positive for chest pain. Negative for leg swelling and palpitations.  Gastrointestinal:  Negative for abdominal pain, constipation, diarrhea, nausea and vomiting.  Genitourinary:  Negative for bladder incontinence, difficulty urinating, dysuria, frequency, hematuria and  nocturia.   Musculoskeletal:  Negative for arthralgias, back pain, flank pain, myalgias and neck pain.  Skin:  Negative for itching and rash.  Neurological:  Negative for dizziness, headaches and numbness.  Hematological:  Does not bruise/bleed easily.  Psychiatric/Behavioral:  Negative for depression, sleep disturbance and suicidal ideas. The patient is not nervous/anxious.   All other systems reviewed and are negative.    VITALS:   Blood pressure 119/73, pulse 79, temperature 98.4 F (36.9 C), temperature source Oral, resp. rate 16, weight 164 lb (74.4 kg), SpO2 98%.  Wt Readings from Last 3 Encounters:  06/14/23 164 lb (74.4 kg)  05/27/23 161 lb 6.4 oz (73.2 kg)  05/24/23 163 lb (73.9 kg)    Body mass index is 27.29 kg/m.  Performance status (ECOG): 1 - Symptomatic but completely ambulatory  PHYSICAL EXAM:   Physical Exam Vitals and nursing note reviewed. Exam conducted with a chaperone present.  Constitutional:      Appearance: Normal appearance.  Cardiovascular:     Rate and Rhythm: Normal rate and regular rhythm.     Pulses: Normal pulses.     Heart sounds: Normal heart sounds.  Pulmonary:     Effort: Pulmonary effort is normal.     Breath sounds: Normal breath sounds.  Abdominal:     Palpations: Abdomen is soft. There is no hepatomegaly, splenomegaly or mass.     Tenderness: There is no abdominal tenderness.  Musculoskeletal:     Right lower leg: No edema.     Left lower leg: No edema.  Lymphadenopathy:     Cervical: No cervical adenopathy.     Right cervical: No superficial, deep or posterior cervical adenopathy.    Left cervical: No superficial, deep or posterior cervical adenopathy.     Upper Body:     Right upper body: No supraclavicular or axillary adenopathy.     Left upper body: No supraclavicular or axillary adenopathy.  Neurological:     General: No focal deficit present.     Mental Status: She is alert and oriented to person, place, and time.   Psychiatric:        Mood and Affect: Mood normal.        Behavior: Behavior normal.     LABS:      Latest Ref Rng & Units 06/08/2023   10:22 AM 05/24/2023    1:02 PM 05/03/2023   12:15 PM  CBC  WBC 4.0 - 10.5 K/uL 3.5  4.9  2.9   Hemoglobin 12.0 - 15.0 g/dL 9.7  32.4  40.1   Hematocrit 36.0 - 46.0 % 30.4  32.6  32.6   Platelets 150 - 400 K/uL 225  252  211       Latest Ref Rng & Units 06/08/2023   10:22 AM 05/24/2023    1:00 PM 05/03/2023   12:15 PM  CMP  Glucose 70 - 99 mg/dL 88  027  253   BUN 8 - 23 mg/dL 13  18  16    Creatinine 0.44 -  1.00 mg/dL 4.09  8.11  9.14   Sodium 135 - 145 mmol/L 135  136  134   Potassium 3.5 - 5.1 mmol/L 3.7  3.6  3.6   Chloride 98 - 111 mmol/L 104  108  103   CO2 22 - 32 mmol/L 22  23  23    Calcium 8.9 - 10.3 mg/dL 8.4  8.2  8.4   Total Protein 6.5 - 8.1 g/dL 6.3  6.8  6.7   Total Bilirubin 0.3 - 1.2 mg/dL 0.6  0.6  0.3   Alkaline Phos 38 - 126 U/L 122  111  104   AST 15 - 41 U/L 17  17  16    ALT 0 - 44 U/L 14  14  13       No results found for: "CEA1", "CEA" / No results found for: "CEA1", "CEA" No results found for: "PSA1" No results found for: "NWG956" No results found for: "CAN125"  No results found for: "TOTALPROTELP", "ALBUMINELP", "A1GS", "A2GS", "BETS", "BETA2SER", "GAMS", "MSPIKE", "SPEI" No results found for: "TIBC", "FERRITIN", "IRONPCTSAT" No results found for: "LDH"   STUDIES:   CT CHEST ABDOMEN PELVIS W CONTRAST  Result Date: 06/13/2023 CLINICAL DATA:  Metastatic breast cancer restaging, assess treatment response * Tracking Code: BO * EXAM: CT CHEST, ABDOMEN, AND PELVIS WITH CONTRAST TECHNIQUE: Multidetector CT imaging of the chest, abdomen and pelvis was performed following the standard protocol during bolus administration of intravenous contrast. RADIATION DOSE REDUCTION: This exam was performed according to the departmental dose-optimization program which includes automated exposure control, adjustment of the mA and/or kV  according to patient size and/or use of iterative reconstruction technique. CONTRAST:  OMNIPAQUE IOHEXOL 300 MG/ML  SOLN COMPARISON:  CT abdomen pelvis, 03/28/2023, CT chest abdomen pelvis, 01/29/2019 FINDINGS: CT CHEST FINDINGS Cardiovascular: Right chest port catheter. Cardiomegaly. No pericardial effusion. Mediastinum/Nodes: No enlarged mediastinal, hilar, or axillary lymph nodes. Small calcified granulomatous mediastinal and left hilar lymph nodes. Thyroid gland, trachea, and esophagus demonstrate no significant findings. Lungs/Pleura: Unchanged occasional small, densely calcified benign granulomatous nodules, for example left upper lobe (series 4, image 61). Unchanged, lobulated, faintly calcified nodule of the superior segment left lower lobe (series 4, image 70). Unchanged, clustered centrilobular and tree-in-bud nodules of the medial left lower lobe (series 4, image 101). These nodules are consistent with benign sequelae of prior infection or inflammation requiring no specific follow-up or characterization. No pleural effusion or pneumothorax. Musculoskeletal: Status post left mastectomy and left axillary lymph node dissection. No acute osseous findings. CT ABDOMEN PELVIS FINDINGS Hepatobiliary: No focal liver abnormality is seen. Status post cholecystectomy. No biliary dilatation. Pancreas: Unremarkable. No pancreatic ductal dilatation or surrounding inflammatory changes. Spleen: Normal in size without significant abnormality. Adrenals/Urinary Tract: Adrenal glands are unremarkable. Kidneys are normal, without renal calculi, solid lesion, or hydronephrosis. Bladder is unremarkable. Stomach/Bowel: Stomach is within normal limits. Appendix not clearly visualized. No evidence of bowel wall thickening, distention, or inflammatory changes. Descending and sigmoid diverticulosis Vascular/Lymphatic: Scattered aortic atherosclerosis. No enlarged abdominal or pelvic lymph nodes. Reproductive: Status post  hysterectomy Other: No abdominal wall hernia. Unchanged fluid attenuation subcutaneous inclusion cyst of the ventral left hemiabdomen (series 2, image 64). No ascites. Musculoskeletal: No acute osseous findings. Unchanged sclerotic vertebral body metastases, including of T8, T11 and S1 (series 7, image 108). Slightly increased lytic character of a small metastasis of the right iliac crest, best appreciated anteriorly (series 2, image 80). Additional lytic metastasis of the left iliac wing is slightly increased in size,  measuring 2.4 cm, previously 1.9 cm (series 2, image 79). IMPRESSION: 1. Slightly increased lytic character of a small metastasis of the right iliac crest. Additional lytic metastasis of the left iliac wing is slightly increased in size. Findings consistent with slightly worsened osseous metastatic disease. 2. Unchanged treated sclerotic vertebral body metastases. 3. No evidence of lymphadenopathy or soft tissue metastatic disease in the chest, abdomen, or pelvis. 4. Status post left mastectomy. 5. Cardiomegaly. 6. Descending and sigmoid diverticulosis. Aortic Atherosclerosis (ICD10-I70.0). Electronically Signed   By: Jearld Lesch M.D.   On: 06/13/2023 21:34   DG FL GUIDED LUMBAR PUNCTURE  Result Date: 05/19/2023 CLINICAL DATA:  Right frontal lobe lesion. History of breast carcinoma. EXAM: LUMBAR PUNCTURE UNDER FLUOROSCOPY FLUOROSCOPY: Radiation Exposure Index (as provided by the fluoroscopic device): 1.8 mGy air Kerma TECHNIQUE: An appropriate skin entry site was determined fluoroscopically. Operator donned sterile gloves and mask. Skin site was marked, then prepped with Betadine, draped in usual sterile fashion, and infiltrated locally with 1% lidocaine. A 20 gauge spinal needle advanced into the thecal sac at L2-3 from a left interlaminar approach. Clear colorless CSF spontaneously returned . 14ml CSF were collected and divided among 4 sterile vials for the requested laboratory studies. The  needle was then removed. COMPLICATIONS: None immediate IMPRESSION: 1. Technically successful lumbar puncture under fluoroscopy. Electronically Signed   By: Corlis Leak M.D.   On: 05/19/2023 10:48

## 2023-06-14 NOTE — Patient Instructions (Signed)
[facility]  Discharge Instructions: Thank you for choosing [facility] to provide your oncology and hematology care.  If you have a lab appointment with the Cancer Center - please note that after April 8th, 2024, all labs will be drawn in the cancer center.  You do not have to check in or register with the main entrance as you have in the past but will complete your check-in in the cancer center.  Wear comfortable clothing and clothing appropriate for easy access to any Portacath or PICC line.   We strive to give you quality time with your provider. You may need to reschedule your appointment if you arrive late (15 or more minutes).  Arriving late affects you and other patients whose appointments are after yours.  Also, if you miss three or more appointments without notifying the office, you may be dismissed from the clinic at the provider's discretion.      For prescription refill requests, have your pharmacy contact our office and allow 72 hours for refills to be completed.    Today you received the following chemotherapy and/or immunotherapy agents Enhertu      To help prevent nausea and vomiting after your treatment, we encourage you to take your nausea medication as directed.  BELOW ARE SYMPTOMS THAT SHOULD BE REPORTED IMMEDIATELY: *FEVER GREATER THAN 100.4 F (38 C) OR HIGHER *CHILLS OR SWEATING *NAUSEA AND VOMITING THAT IS NOT CONTROLLED WITH YOUR NAUSEA MEDICATION *UNUSUAL SHORTNESS OF BREATH *UNUSUAL BRUISING OR BLEEDING *URINARY PROBLEMS (pain or burning when urinating, or frequent urination) *BOWEL PROBLEMS (unusual diarrhea, constipation, pain near the anus) TENDERNESS IN MOUTH AND THROAT WITH OR WITHOUT PRESENCE OF ULCERS (sore throat, sores in mouth, or a toothache) UNUSUAL RASH, SWELLING OR PAIN  UNUSUAL VAGINAL DISCHARGE OR ITCHING   Items with * indicate a potential emergency and should be followed up as soon as possible or go to the  Emergency Department if any problems should occur.  Please show the CHEMOTHERAPY ALERT CARD or IMMUNOTHERAPY ALERT CARD at check-in to the Emergency Department and triage nurse.  Should you have questions after your visit or need to cancel or reschedule your appointment, please contact [facility] AT [facility] [phone]  and follow the prompts.  Office hours are 8:00 a.m. to 4:30 p.m. Monday - Friday. Please note that voicemails left after 4:00 p.m. may not be returned until the following business day.  We are closed weekends and major holidays. You have access to a nurse at all times for urgent questions. Please call the main number to the clinic [phone] and follow the prompts.  For any non-urgent questions, you may also contact your provider using MyChart. We now offer e-Visits for anyone 35 and older to request care online for non-urgent symptoms. For details visit [email or web address].   Also download the MyChart app! Go to the app store, search "MyChart", open the app, select [place], and log in with your MyChart username and password.

## 2023-06-14 NOTE — Progress Notes (Signed)
Labs reviewed with MD today. Ok to treat per MD.  

## 2023-06-14 NOTE — Progress Notes (Signed)
Patient has been examined by Dr. Katragadda. Vital signs and labs have been reviewed by MD - ANC, Creatinine, LFTs, hemoglobin, and platelets are within treatment parameters per M.D. - pt may proceed with treatment.  Primary RN and pharmacy notified.  

## 2023-06-14 NOTE — Patient Instructions (Addendum)
[facility] at [facility] Discharge Instructions   You were seen and examined today by Dr. Ellin Saba.  He reviewed the results of your lab work which are normal/stable.   He reviewed the results of your CT scan which is mostly stable. It is showing that some spots in the bones have slightly increased, but otherwise is overall stable. Dr. Kirtland Bouchard does not think we need to switch your treatment at this time.   We will proceed with your treatment today.   Return as scheduled.    Thank you for choosing [facility] at [facility] to provide your oncology and hematology care.  To afford each patient quality time with our provider, please arrive at least 15 minutes before your scheduled appointment time.   If you have a lab appointment with the Cancer Center please come in thru the Main Entrance and check in at the main information desk.  You need to re-schedule your appointment should you arrive 10 or more minutes late.  We strive to give you quality time with our providers, and arriving late affects you and other patients whose appointments are after yours.  Also, if you no show three or more times for appointments you may be dismissed from the clinic at the providers discretion.     Again, thank you for choosing [facility].  Our hope is that these requests will decrease the amount of time that you wait before being seen by our physicians.       _____________________________________________________________  Should you have questions after your visit to [facility], please contact our office at [phone] and follow the prompts.  Our office hours are 8:00 a.m. and 4:30 p.m. Monday - Friday.  Please note that voicemails left after 4:00 p.m. may not be returned until the following business day.  We are closed weekends and major holidays.  You do have access to a nurse 24-7, just call the main number to the clinic [phone] and  do not press any options, hold on the line and a nurse will answer the phone.    For prescription refill requests, have your pharmacy contact our office and allow 72 hours.    Due to Covid, you will need to wear a mask upon entering the hospital. If you do not have a mask, a mask will be given to you at the Main Entrance upon arrival. For doctor visits, patients may have 1 support person age 30 or older with them. For treatment visits, patients can not have anyone with them due to social distancing guidelines and our immunocompromised population.

## 2023-06-15 ENCOUNTER — Ambulatory Visit
Admission: RE | Admit: 2023-06-15 | Discharge: 2023-06-15 | Disposition: A | Discharge status: Home / Self Care | Payer: Medicare Other | Source: Ambulatory Visit | Attending: Radiation Oncology | Admitting: Radiation Oncology

## 2023-06-15 ENCOUNTER — Other Ambulatory Visit: Payer: Self-pay

## 2023-06-15 DIAGNOSIS — Z801 Family history of malignant neoplasm of trachea, bronchus and lung: Secondary | ICD-10-CM | POA: Insufficient documentation

## 2023-06-15 DIAGNOSIS — C50912 Malignant neoplasm of unspecified site of left female breast: Secondary | ICD-10-CM | POA: Insufficient documentation

## 2023-06-15 DIAGNOSIS — Z452 Encounter for adjustment and management of vascular access device: Secondary | ICD-10-CM | POA: Insufficient documentation

## 2023-06-15 DIAGNOSIS — Z90710 Acquired absence of both cervix and uterus: Secondary | ICD-10-CM | POA: Insufficient documentation

## 2023-06-15 DIAGNOSIS — C7951 Secondary malignant neoplasm of bone: Secondary | ICD-10-CM | POA: Insufficient documentation

## 2023-06-15 DIAGNOSIS — Z510 Encounter for antineoplastic radiation therapy: Secondary | ICD-10-CM | POA: Insufficient documentation

## 2023-06-15 DIAGNOSIS — Z7952 Long term (current) use of systemic steroids: Secondary | ICD-10-CM | POA: Diagnosis not present

## 2023-06-15 DIAGNOSIS — I700 Atherosclerosis of aorta: Secondary | ICD-10-CM | POA: Diagnosis not present

## 2023-06-15 DIAGNOSIS — G629 Polyneuropathy, unspecified: Secondary | ICD-10-CM | POA: Diagnosis not present

## 2023-06-15 DIAGNOSIS — R918 Other nonspecific abnormal finding of lung field: Secondary | ICD-10-CM | POA: Diagnosis not present

## 2023-06-15 DIAGNOSIS — Z79890 Hormone replacement therapy (postmenopausal): Secondary | ICD-10-CM | POA: Insufficient documentation

## 2023-06-15 DIAGNOSIS — E039 Hypothyroidism, unspecified: Secondary | ICD-10-CM | POA: Diagnosis not present

## 2023-06-15 DIAGNOSIS — Z170 Estrogen receptor positive status [ER+]: Secondary | ICD-10-CM | POA: Insufficient documentation

## 2023-06-15 DIAGNOSIS — J45909 Unspecified asthma, uncomplicated: Secondary | ICD-10-CM | POA: Insufficient documentation

## 2023-06-15 DIAGNOSIS — Z79899 Other long term (current) drug therapy: Secondary | ICD-10-CM | POA: Insufficient documentation

## 2023-06-15 DIAGNOSIS — I119 Hypertensive heart disease without heart failure: Secondary | ICD-10-CM | POA: Diagnosis not present

## 2023-06-15 DIAGNOSIS — K5730 Diverticulosis of large intestine without perforation or abscess without bleeding: Secondary | ICD-10-CM | POA: Insufficient documentation

## 2023-06-15 DIAGNOSIS — Z5112 Encounter for antineoplastic immunotherapy: Secondary | ICD-10-CM | POA: Diagnosis not present

## 2023-06-15 DIAGNOSIS — C7931 Secondary malignant neoplasm of brain: Secondary | ICD-10-CM | POA: Insufficient documentation

## 2023-06-15 DIAGNOSIS — Z9012 Acquired absence of left breast and nipple: Secondary | ICD-10-CM | POA: Insufficient documentation

## 2023-06-15 DIAGNOSIS — Z803 Family history of malignant neoplasm of breast: Secondary | ICD-10-CM | POA: Diagnosis not present

## 2023-06-15 DIAGNOSIS — Z171 Estrogen receptor negative status [ER-]: Secondary | ICD-10-CM | POA: Diagnosis not present

## 2023-06-15 LAB — RAD ONC ARIA SESSION SUMMARY
Course Elapsed Days: 0
Plan Fractions Treated to Date: 1
Plan Prescribed Dose Per Fraction: 3 Gy
Plan Total Fractions Prescribed: 10
Plan Total Prescribed Dose: 30 Gy
Reference Point Dosage Given to Date: 3 Gy
Reference Point Session Dosage Given: 3 Gy
Session Number: 1

## 2023-06-16 ENCOUNTER — Inpatient Hospital Stay: Payer: Medicare Other | Attending: Hematology

## 2023-06-16 ENCOUNTER — Ambulatory Visit
Admission: RE | Admit: 2023-06-16 | Discharge: 2023-06-16 | Disposition: A | Discharge status: Home / Self Care | Payer: Medicare Other | Source: Ambulatory Visit | Attending: Radiation Oncology | Admitting: Radiation Oncology

## 2023-06-16 ENCOUNTER — Other Ambulatory Visit: Payer: Self-pay

## 2023-06-16 VITALS — BP 99/59 | HR 73 | Temp 97.6°F | Resp 20

## 2023-06-16 DIAGNOSIS — R918 Other nonspecific abnormal finding of lung field: Secondary | ICD-10-CM | POA: Insufficient documentation

## 2023-06-16 DIAGNOSIS — I700 Atherosclerosis of aorta: Secondary | ICD-10-CM | POA: Insufficient documentation

## 2023-06-16 DIAGNOSIS — G629 Polyneuropathy, unspecified: Secondary | ICD-10-CM | POA: Insufficient documentation

## 2023-06-16 DIAGNOSIS — Z452 Encounter for adjustment and management of vascular access device: Secondary | ICD-10-CM | POA: Insufficient documentation

## 2023-06-16 DIAGNOSIS — E039 Hypothyroidism, unspecified: Secondary | ICD-10-CM | POA: Insufficient documentation

## 2023-06-16 DIAGNOSIS — Z171 Estrogen receptor negative status [ER-]: Secondary | ICD-10-CM | POA: Insufficient documentation

## 2023-06-16 DIAGNOSIS — C7931 Secondary malignant neoplasm of brain: Secondary | ICD-10-CM | POA: Insufficient documentation

## 2023-06-16 DIAGNOSIS — Z5112 Encounter for antineoplastic immunotherapy: Secondary | ICD-10-CM | POA: Insufficient documentation

## 2023-06-16 DIAGNOSIS — Z801 Family history of malignant neoplasm of trachea, bronchus and lung: Secondary | ICD-10-CM | POA: Insufficient documentation

## 2023-06-16 DIAGNOSIS — I119 Hypertensive heart disease without heart failure: Secondary | ICD-10-CM | POA: Insufficient documentation

## 2023-06-16 DIAGNOSIS — Z79899 Other long term (current) drug therapy: Secondary | ICD-10-CM | POA: Insufficient documentation

## 2023-06-16 DIAGNOSIS — K5730 Diverticulosis of large intestine without perforation or abscess without bleeding: Secondary | ICD-10-CM | POA: Insufficient documentation

## 2023-06-16 DIAGNOSIS — Z90710 Acquired absence of both cervix and uterus: Secondary | ICD-10-CM | POA: Insufficient documentation

## 2023-06-16 DIAGNOSIS — J45909 Unspecified asthma, uncomplicated: Secondary | ICD-10-CM | POA: Insufficient documentation

## 2023-06-16 DIAGNOSIS — Z95828 Presence of other vascular implants and grafts: Secondary | ICD-10-CM

## 2023-06-16 DIAGNOSIS — C7951 Secondary malignant neoplasm of bone: Secondary | ICD-10-CM | POA: Diagnosis not present

## 2023-06-16 DIAGNOSIS — Z7952 Long term (current) use of systemic steroids: Secondary | ICD-10-CM | POA: Insufficient documentation

## 2023-06-16 DIAGNOSIS — Z803 Family history of malignant neoplasm of breast: Secondary | ICD-10-CM | POA: Insufficient documentation

## 2023-06-16 DIAGNOSIS — Z79890 Hormone replacement therapy (postmenopausal): Secondary | ICD-10-CM | POA: Insufficient documentation

## 2023-06-16 DIAGNOSIS — Z9012 Acquired absence of left breast and nipple: Secondary | ICD-10-CM | POA: Insufficient documentation

## 2023-06-16 DIAGNOSIS — C50912 Malignant neoplasm of unspecified site of left female breast: Secondary | ICD-10-CM | POA: Diagnosis not present

## 2023-06-16 DIAGNOSIS — Z510 Encounter for antineoplastic radiation therapy: Secondary | ICD-10-CM | POA: Diagnosis not present

## 2023-06-16 LAB — RAD ONC ARIA SESSION SUMMARY
Course Elapsed Days: 1
Plan Fractions Treated to Date: 2
Plan Prescribed Dose Per Fraction: 3 Gy
Plan Total Fractions Prescribed: 10
Plan Total Prescribed Dose: 30 Gy
Reference Point Dosage Given to Date: 6 Gy
Reference Point Session Dosage Given: 3 Gy
Session Number: 2

## 2023-06-16 MED ORDER — PEGFILGRASTIM-FPGK 6 MG/0.6ML ~~LOC~~ SOSY
6.0000 mg | PREFILLED_SYRINGE | Freq: Once | SUBCUTANEOUS | Status: AC
  Administered 2023-06-16: 6 mg via SUBCUTANEOUS
  Filled 2023-06-16: qty 0.6

## 2023-06-17 ENCOUNTER — Other Ambulatory Visit: Payer: Self-pay

## 2023-06-17 ENCOUNTER — Ambulatory Visit
Admission: RE | Admit: 2023-06-17 | Discharge: 2023-06-17 | Disposition: A | Discharge status: Home / Self Care | Payer: Medicare Other | Source: Ambulatory Visit | Attending: Radiation Oncology | Admitting: Radiation Oncology

## 2023-06-17 DIAGNOSIS — Z171 Estrogen receptor negative status [ER-]: Secondary | ICD-10-CM | POA: Diagnosis not present

## 2023-06-17 DIAGNOSIS — C7931 Secondary malignant neoplasm of brain: Secondary | ICD-10-CM | POA: Diagnosis not present

## 2023-06-17 DIAGNOSIS — Z5112 Encounter for antineoplastic immunotherapy: Secondary | ICD-10-CM | POA: Diagnosis not present

## 2023-06-17 DIAGNOSIS — C7951 Secondary malignant neoplasm of bone: Secondary | ICD-10-CM | POA: Diagnosis not present

## 2023-06-17 DIAGNOSIS — C50912 Malignant neoplasm of unspecified site of left female breast: Secondary | ICD-10-CM | POA: Diagnosis not present

## 2023-06-17 DIAGNOSIS — Z510 Encounter for antineoplastic radiation therapy: Secondary | ICD-10-CM | POA: Diagnosis not present

## 2023-06-17 LAB — RAD ONC ARIA SESSION SUMMARY
Course Elapsed Days: 2
Plan Fractions Treated to Date: 3
Plan Prescribed Dose Per Fraction: 3 Gy
Plan Total Fractions Prescribed: 10
Plan Total Prescribed Dose: 30 Gy
Reference Point Dosage Given to Date: 9 Gy
Reference Point Session Dosage Given: 3 Gy
Session Number: 3

## 2023-06-18 ENCOUNTER — Ambulatory Visit: Payer: Medicare Other

## 2023-06-18 ENCOUNTER — Ambulatory Visit
Admission: RE | Admit: 2023-06-18 | Discharge: 2023-06-18 | Disposition: A | Discharge status: Home / Self Care | Payer: Medicare Other | Source: Ambulatory Visit | Attending: Radiation Oncology

## 2023-06-18 ENCOUNTER — Other Ambulatory Visit: Payer: Self-pay

## 2023-06-18 DIAGNOSIS — C50912 Malignant neoplasm of unspecified site of left female breast: Secondary | ICD-10-CM | POA: Diagnosis not present

## 2023-06-18 DIAGNOSIS — C7951 Secondary malignant neoplasm of bone: Secondary | ICD-10-CM | POA: Diagnosis not present

## 2023-06-18 DIAGNOSIS — C7931 Secondary malignant neoplasm of brain: Secondary | ICD-10-CM | POA: Diagnosis not present

## 2023-06-18 DIAGNOSIS — Z510 Encounter for antineoplastic radiation therapy: Secondary | ICD-10-CM | POA: Diagnosis not present

## 2023-06-18 DIAGNOSIS — Z5112 Encounter for antineoplastic immunotherapy: Secondary | ICD-10-CM | POA: Diagnosis not present

## 2023-06-18 DIAGNOSIS — Z171 Estrogen receptor negative status [ER-]: Secondary | ICD-10-CM | POA: Diagnosis not present

## 2023-06-18 LAB — RAD ONC ARIA SESSION SUMMARY
Course Elapsed Days: 3
Plan Fractions Treated to Date: 4
Plan Prescribed Dose Per Fraction: 3 Gy
Plan Total Fractions Prescribed: 10
Plan Total Prescribed Dose: 30 Gy
Reference Point Dosage Given to Date: 12 Gy
Reference Point Session Dosage Given: 3 Gy
Session Number: 4

## 2023-06-21 ENCOUNTER — Telehealth: Payer: Self-pay | Admitting: *Deleted

## 2023-06-21 ENCOUNTER — Other Ambulatory Visit: Payer: Self-pay | Admitting: Radiation Oncology

## 2023-06-21 ENCOUNTER — Ambulatory Visit: Payer: Medicare Other

## 2023-06-21 DIAGNOSIS — R110 Nausea: Secondary | ICD-10-CM

## 2023-06-21 MED ORDER — DEXAMETHASONE 4 MG PO TABS: 4.0000 mg | ORAL_TABLET | Freq: Two times a day (BID) | ORAL | 1 refills | Status: DC

## 2023-06-21 NOTE — Telephone Encounter (Signed)
CALLED PATIENT TO ASK ABOUT COMING IN FOR LABS ON 06-22-23, LVM FOR A RETURN CALL

## 2023-06-21 NOTE — Telephone Encounter (Signed)
Spoke with the patient regarding a voicemail she left.  She reports she is having increased headaches, inner ear aches, diarrhea, nausea, chills, and blurred vision with eye drainage.  Denies respiratory symptoms or fever.  She reports she has not been able to eat or drink much over the weekend and notes her urine is very dark in color.  She states she has not taken any medications but has some on hand.  All of her concerns were discussed with the PA and it is recommended that she start a course of steroids.  Dexamethasone was called in to her pharmacy [facility] for pick up.  We reviewed dosing instructions (2 tablets upon receipt, one tablet this evening, starting tomorrow she is to take one tablet twice daily) and she verbalized understanding.  She was strongly encouraged to take her zofran now and follow that with food, fluids, protonix, imodium and sudafed.  She states she does not think she will be able to come in today for her radiation treatment and would like to cancel with plans to come in tomorrow.  We will want to obtain lab work tomorrow to check hydration status.  She will be called with a lab appointment.  Lind Covert RN, BSN

## 2023-06-21 NOTE — Telephone Encounter (Signed)
Patient called and asked to come in for lab appointment tomorrow at 1:30 pm instead of 3:00 pm.  She is in agreement with this.  Lind Covert RN, BSN

## 2023-06-22 ENCOUNTER — Other Ambulatory Visit: Payer: Self-pay

## 2023-06-22 ENCOUNTER — Ambulatory Visit
Admission: RE | Admit: 2023-06-22 | Discharge: 2023-06-22 | Disposition: A | Discharge status: Home / Self Care | Payer: Medicare Other | Source: Ambulatory Visit | Attending: Radiation Oncology | Admitting: Radiation Oncology

## 2023-06-22 ENCOUNTER — Ambulatory Visit: Payer: Medicare Other

## 2023-06-22 ENCOUNTER — Inpatient Hospital Stay: Payer: Medicare Other

## 2023-06-22 ENCOUNTER — Other Ambulatory Visit: Payer: Self-pay | Admitting: Radiation Oncology

## 2023-06-22 ENCOUNTER — Encounter: Payer: Self-pay | Admitting: Radiation Oncology

## 2023-06-22 ENCOUNTER — Ambulatory Visit
Admission: RE | Admit: 2023-06-22 | Discharge: 2023-06-22 | Disposition: A | Discharge status: Home / Self Care | Payer: Medicare Other | Source: Ambulatory Visit | Attending: Radiation Oncology

## 2023-06-22 DIAGNOSIS — Z510 Encounter for antineoplastic radiation therapy: Secondary | ICD-10-CM | POA: Diagnosis not present

## 2023-06-22 DIAGNOSIS — C50912 Malignant neoplasm of unspecified site of left female breast: Secondary | ICD-10-CM

## 2023-06-22 DIAGNOSIS — C7951 Secondary malignant neoplasm of bone: Secondary | ICD-10-CM | POA: Diagnosis not present

## 2023-06-22 DIAGNOSIS — C7931 Secondary malignant neoplasm of brain: Secondary | ICD-10-CM

## 2023-06-22 DIAGNOSIS — R110 Nausea: Secondary | ICD-10-CM

## 2023-06-22 DIAGNOSIS — Z5112 Encounter for antineoplastic immunotherapy: Secondary | ICD-10-CM | POA: Diagnosis not present

## 2023-06-22 DIAGNOSIS — Z171 Estrogen receptor negative status [ER-]: Secondary | ICD-10-CM | POA: Diagnosis not present

## 2023-06-22 DIAGNOSIS — D702 Other drug-induced agranulocytosis: Secondary | ICD-10-CM

## 2023-06-22 DIAGNOSIS — Z95828 Presence of other vascular implants and grafts: Secondary | ICD-10-CM

## 2023-06-22 LAB — RAD ONC ARIA SESSION SUMMARY
Course Elapsed Days: 7
Plan Fractions Treated to Date: 5
Plan Prescribed Dose Per Fraction: 3 Gy
Plan Total Fractions Prescribed: 10
Plan Total Prescribed Dose: 30 Gy
Reference Point Dosage Given to Date: 15 Gy
Reference Point Session Dosage Given: 3 Gy
Session Number: 5

## 2023-06-22 LAB — CBC WITH DIFFERENTIAL (CANCER CENTER ONLY)
Abs Immature Granulocytes: 0.59 10*3/uL — ABNORMAL HIGH (ref 0.00–0.07)
Basophils Absolute: 0 10*3/uL (ref 0.0–0.1)
Basophils Relative: 0 %
Eosinophils Absolute: 0 10*3/uL (ref 0.0–0.5)
Eosinophils Relative: 0 %
HCT: 32.1 % — ABNORMAL LOW (ref 36.0–46.0)
Hemoglobin: 11 g/dL — ABNORMAL LOW (ref 12.0–15.0)
Immature Granulocytes: 2 %
Lymphocytes Relative: 3 %
Lymphs Abs: 0.9 10*3/uL (ref 0.7–4.0)
MCH: 36.5 pg — ABNORMAL HIGH (ref 26.0–34.0)
MCHC: 34.3 g/dL (ref 30.0–36.0)
MCV: 106.6 fL — ABNORMAL HIGH (ref 80.0–100.0)
Monocytes Absolute: 1 10*3/uL (ref 0.1–1.0)
Monocytes Relative: 3 %
Neutro Abs: 32.4 10*3/uL — ABNORMAL HIGH (ref 1.7–7.7)
Neutrophils Relative %: 92 %
Platelet Count: 191 10*3/uL (ref 150–400)
RBC: 3.01 MIL/uL — ABNORMAL LOW (ref 3.87–5.11)
RDW: 13.1 % (ref 11.5–15.5)
Smear Review: NORMAL
WBC Count: 34.8 10*3/uL — ABNORMAL HIGH (ref 4.0–10.5)
nRBC: 0 % (ref 0.0–0.2)

## 2023-06-22 LAB — CMP (CANCER CENTER ONLY)
ALT: 13 U/L (ref 0–44)
AST: 14 U/L — ABNORMAL LOW (ref 15–41)
Albumin: 4 g/dL (ref 3.5–5.0)
Alkaline Phosphatase: 202 U/L — ABNORMAL HIGH (ref 38–126)
Anion gap: 7 (ref 5–15)
BUN: 26 mg/dL — ABNORMAL HIGH (ref 8–23)
CO2: 24 mmol/L (ref 22–32)
Calcium: 9.2 mg/dL (ref 8.9–10.3)
Chloride: 106 mmol/L (ref 98–111)
Creatinine: 1.09 mg/dL — ABNORMAL HIGH (ref 0.44–1.00)
GFR, Estimated: 53 mL/min — ABNORMAL LOW (ref >60)
Glucose, Bld: 142 mg/dL — ABNORMAL HIGH (ref 70–99)
Potassium: 4.4 mmol/L (ref 3.5–5.1)
Sodium: 137 mmol/L (ref 135–145)
Total Bilirubin: 0.2 mg/dL — ABNORMAL LOW (ref 0.3–1.2)
Total Protein: 7 g/dL (ref 6.5–8.1)

## 2023-06-22 LAB — MAGNESIUM: Magnesium: 1.8 mg/dL (ref 1.7–2.4)

## 2023-06-22 MED ORDER — SODIUM CHLORIDE 0.9% FLUSH
10.0000 mL | INTRAVENOUS | Status: DC | PRN
  Administered 2023-06-22: 10 mL via INTRAVENOUS

## 2023-06-22 MED ORDER — SODIUM CHLORIDE 0.9% FLUSH
3.0000 mL | INTRAVENOUS | Status: DC | PRN
  Administered 2023-06-22: 3 mL via INTRAVENOUS

## 2023-06-22 MED ORDER — HEPARIN SOD (PORK) LOCK FLUSH 100 UNIT/ML IV SOLN
500.0000 [IU] | Freq: Once | INTRAVENOUS | Status: AC
  Administered 2023-06-22: 500 [IU] via INTRAVENOUS

## 2023-06-22 MED ORDER — SODIUM CHLORIDE 0.9 % IV SOLN: Freq: Once | INTRAVENOUS | Status: AC

## 2023-06-23 ENCOUNTER — Other Ambulatory Visit: Payer: Self-pay

## 2023-06-23 ENCOUNTER — Ambulatory Visit
Admission: RE | Admit: 2023-06-23 | Discharge: 2023-06-23 | Disposition: A | Discharge status: Home / Self Care | Payer: Medicare Other | Source: Ambulatory Visit | Attending: Radiation Oncology | Admitting: Radiation Oncology

## 2023-06-23 ENCOUNTER — Ambulatory Visit: Payer: Medicare Other

## 2023-06-23 DIAGNOSIS — C50912 Malignant neoplasm of unspecified site of left female breast: Secondary | ICD-10-CM | POA: Diagnosis not present

## 2023-06-23 DIAGNOSIS — Z5112 Encounter for antineoplastic immunotherapy: Secondary | ICD-10-CM | POA: Diagnosis not present

## 2023-06-23 DIAGNOSIS — Z510 Encounter for antineoplastic radiation therapy: Secondary | ICD-10-CM | POA: Diagnosis not present

## 2023-06-23 DIAGNOSIS — Z171 Estrogen receptor negative status [ER-]: Secondary | ICD-10-CM | POA: Diagnosis not present

## 2023-06-23 DIAGNOSIS — C7951 Secondary malignant neoplasm of bone: Secondary | ICD-10-CM | POA: Diagnosis not present

## 2023-06-23 DIAGNOSIS — C7931 Secondary malignant neoplasm of brain: Secondary | ICD-10-CM | POA: Diagnosis not present

## 2023-06-23 LAB — RAD ONC ARIA SESSION SUMMARY
Course Elapsed Days: 8
Plan Fractions Treated to Date: 6
Plan Prescribed Dose Per Fraction: 3 Gy
Plan Total Fractions Prescribed: 10
Plan Total Prescribed Dose: 30 Gy
Reference Point Dosage Given to Date: 18 Gy
Reference Point Session Dosage Given: 3 Gy
Session Number: 6

## 2023-06-23 LAB — CANCER ANTIGEN 15-3: CA 15-3: 37.1 U/mL — ABNORMAL HIGH (ref 0.0–25.0)

## 2023-06-23 LAB — CANCER ANTIGEN 27.29: CA 27.29: 44.2 U/mL — ABNORMAL HIGH (ref 0.0–38.6)

## 2023-06-24 ENCOUNTER — Ambulatory Visit
Admission: RE | Admit: 2023-06-24 | Discharge: 2023-06-24 | Disposition: A | Discharge status: Home / Self Care | Payer: Medicare Other | Source: Ambulatory Visit | Attending: Radiation Oncology | Admitting: Radiation Oncology

## 2023-06-24 ENCOUNTER — Other Ambulatory Visit: Payer: Self-pay

## 2023-06-24 ENCOUNTER — Encounter (HOSPITAL_COMMUNITY): Payer: Self-pay | Admitting: Hematology

## 2023-06-24 ENCOUNTER — Encounter: Payer: Self-pay | Admitting: Hematology

## 2023-06-24 DIAGNOSIS — Z171 Estrogen receptor negative status [ER-]: Secondary | ICD-10-CM | POA: Diagnosis not present

## 2023-06-24 DIAGNOSIS — C50912 Malignant neoplasm of unspecified site of left female breast: Secondary | ICD-10-CM | POA: Diagnosis not present

## 2023-06-24 DIAGNOSIS — C7931 Secondary malignant neoplasm of brain: Secondary | ICD-10-CM | POA: Diagnosis not present

## 2023-06-24 DIAGNOSIS — Z5112 Encounter for antineoplastic immunotherapy: Secondary | ICD-10-CM | POA: Diagnosis not present

## 2023-06-24 DIAGNOSIS — C7951 Secondary malignant neoplasm of bone: Secondary | ICD-10-CM | POA: Diagnosis not present

## 2023-06-24 DIAGNOSIS — Z510 Encounter for antineoplastic radiation therapy: Secondary | ICD-10-CM | POA: Diagnosis not present

## 2023-06-24 LAB — RAD ONC ARIA SESSION SUMMARY
Course Elapsed Days: 9
Plan Fractions Treated to Date: 7
Plan Prescribed Dose Per Fraction: 3 Gy
Plan Total Fractions Prescribed: 10
Plan Total Prescribed Dose: 30 Gy
Reference Point Dosage Given to Date: 21 Gy
Reference Point Session Dosage Given: 3 Gy
Session Number: 7

## 2023-06-24 NOTE — Progress Notes (Signed)
Patient came around to the nursing clinic after her radiation treatment with complaints of sore throat, cough, SOB, chills, runny nose and sneezing.  She reports this started a couple days ago.  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  She was advised to take a covid test and report the results to [place].  She was also encouraged to reach out to Dr. Marice Potter office as she is currently receiving treatment with him.  She was asking about antibiotics and I informed her that the PA did not feel this was warranted at this time but to further discuss with Dr. Ellin Saba.  She verbalized understanding and will let us know the results of her covid test.  BP 126/79   Pulse 69   Temp 97.7 F (36.5 C) (Temporal)   Resp 20   SpO2 99%     Lind Covert RN, BSN

## 2023-06-24 NOTE — Progress Notes (Signed)
The patient was seen today, she has a history of metastatic breast cancer with known leptomeningeal disease. She is currently receiving radiation and was started on dexamethasone due to symptoms that were felt to be related to her leptomeningeal disease including persistent nausea and vomiting and neck and headache type pain.  She had been experiencing poor oral intake over the last week, and had acknowledged symptoms of concentrated dark urine, she was encouraged to try antiemetics and to try and push oral fluids which she was able to do and after starting steroids seem to have some improvement.  She was asked to have labs repeated today to determine if she was still clinically dehydrated.  In reviewing her BUN and creatinine which are both elevated in comparison to her prior studies about 2 weeks ago we discussed the rationale for a liter of IV fluid.  She is in agreement with this today after her therapy.  She will continue steroids until she completes her whole brain radiation.

## 2023-06-25 ENCOUNTER — Ambulatory Visit
Admission: RE | Admit: 2023-06-25 | Discharge: 2023-06-25 | Disposition: A | Discharge status: Home / Self Care | Payer: Medicare Other | Source: Ambulatory Visit | Attending: Radiation Oncology | Admitting: Radiation Oncology

## 2023-06-25 ENCOUNTER — Other Ambulatory Visit: Payer: Self-pay

## 2023-06-25 DIAGNOSIS — C7951 Secondary malignant neoplasm of bone: Secondary | ICD-10-CM | POA: Diagnosis not present

## 2023-06-25 DIAGNOSIS — C50912 Malignant neoplasm of unspecified site of left female breast: Secondary | ICD-10-CM | POA: Diagnosis not present

## 2023-06-25 DIAGNOSIS — Z510 Encounter for antineoplastic radiation therapy: Secondary | ICD-10-CM | POA: Diagnosis not present

## 2023-06-25 DIAGNOSIS — C7931 Secondary malignant neoplasm of brain: Secondary | ICD-10-CM | POA: Diagnosis not present

## 2023-06-25 DIAGNOSIS — Z5112 Encounter for antineoplastic immunotherapy: Secondary | ICD-10-CM | POA: Diagnosis not present

## 2023-06-25 DIAGNOSIS — Z171 Estrogen receptor negative status [ER-]: Secondary | ICD-10-CM | POA: Diagnosis not present

## 2023-06-25 LAB — RAD ONC ARIA SESSION SUMMARY
Course Elapsed Days: 10
Plan Fractions Treated to Date: 8
Plan Prescribed Dose Per Fraction: 3 Gy
Plan Total Fractions Prescribed: 10
Plan Total Prescribed Dose: 30 Gy
Reference Point Dosage Given to Date: 24 Gy
Reference Point Session Dosage Given: 3 Gy
Session Number: 8

## 2023-06-28 ENCOUNTER — Ambulatory Visit: Payer: Medicare Other

## 2023-06-28 ENCOUNTER — Ambulatory Visit
Admission: RE | Admit: 2023-06-28 | Discharge: 2023-06-28 | Disposition: A | Discharge status: Home / Self Care | Payer: Medicare Other | Source: Ambulatory Visit | Attending: Radiation Oncology

## 2023-06-28 ENCOUNTER — Other Ambulatory Visit: Payer: Self-pay

## 2023-06-28 DIAGNOSIS — Z171 Estrogen receptor negative status [ER-]: Secondary | ICD-10-CM | POA: Diagnosis not present

## 2023-06-28 DIAGNOSIS — C50912 Malignant neoplasm of unspecified site of left female breast: Secondary | ICD-10-CM | POA: Diagnosis not present

## 2023-06-28 DIAGNOSIS — C7931 Secondary malignant neoplasm of brain: Secondary | ICD-10-CM | POA: Diagnosis not present

## 2023-06-28 DIAGNOSIS — Z510 Encounter for antineoplastic radiation therapy: Secondary | ICD-10-CM | POA: Diagnosis not present

## 2023-06-28 DIAGNOSIS — Z5112 Encounter for antineoplastic immunotherapy: Secondary | ICD-10-CM | POA: Diagnosis not present

## 2023-06-28 DIAGNOSIS — C7951 Secondary malignant neoplasm of bone: Secondary | ICD-10-CM | POA: Diagnosis not present

## 2023-06-28 LAB — RAD ONC ARIA SESSION SUMMARY
Course Elapsed Days: 13
Plan Fractions Treated to Date: 9
Plan Prescribed Dose Per Fraction: 3 Gy
Plan Total Fractions Prescribed: 10
Plan Total Prescribed Dose: 30 Gy
Reference Point Dosage Given to Date: 27 Gy
Reference Point Session Dosage Given: 3 Gy
Session Number: 9

## 2023-06-29 ENCOUNTER — Other Ambulatory Visit: Payer: Self-pay

## 2023-06-29 ENCOUNTER — Inpatient Hospital Stay: Payer: Medicare Other

## 2023-06-29 ENCOUNTER — Encounter (HOSPITAL_COMMUNITY)
Admission: RE | Admit: 2023-06-29 | Discharge: 2023-06-29 | Disposition: A | Discharge status: Home / Self Care | Payer: Medicare Other | Source: Ambulatory Visit | Attending: Hematology | Admitting: Hematology

## 2023-06-29 ENCOUNTER — Encounter (HOSPITAL_COMMUNITY): Payer: Self-pay

## 2023-06-29 ENCOUNTER — Encounter: Payer: Self-pay | Admitting: Radiation Oncology

## 2023-06-29 ENCOUNTER — Ambulatory Visit
Admission: RE | Admit: 2023-06-29 | Discharge: 2023-06-29 | Disposition: A | Discharge status: Home / Self Care | Payer: Medicare Other | Source: Ambulatory Visit | Attending: Radiation Oncology

## 2023-06-29 DIAGNOSIS — C7931 Secondary malignant neoplasm of brain: Secondary | ICD-10-CM | POA: Diagnosis not present

## 2023-06-29 DIAGNOSIS — M19072 Primary osteoarthritis, left ankle and foot: Secondary | ICD-10-CM | POA: Diagnosis not present

## 2023-06-29 DIAGNOSIS — C50912 Malignant neoplasm of unspecified site of left female breast: Secondary | ICD-10-CM | POA: Insufficient documentation

## 2023-06-29 DIAGNOSIS — Z171 Estrogen receptor negative status [ER-]: Secondary | ICD-10-CM | POA: Diagnosis not present

## 2023-06-29 DIAGNOSIS — Z5112 Encounter for antineoplastic immunotherapy: Secondary | ICD-10-CM | POA: Diagnosis not present

## 2023-06-29 DIAGNOSIS — Z510 Encounter for antineoplastic radiation therapy: Secondary | ICD-10-CM | POA: Diagnosis not present

## 2023-06-29 DIAGNOSIS — C7951 Secondary malignant neoplasm of bone: Secondary | ICD-10-CM | POA: Diagnosis not present

## 2023-06-29 DIAGNOSIS — C801 Malignant (primary) neoplasm, unspecified: Secondary | ICD-10-CM | POA: Diagnosis not present

## 2023-06-29 DIAGNOSIS — M19071 Primary osteoarthritis, right ankle and foot: Secondary | ICD-10-CM | POA: Diagnosis not present

## 2023-06-29 LAB — RAD ONC ARIA SESSION SUMMARY
Course Elapsed Days: 14
Plan Fractions Treated to Date: 10
Plan Prescribed Dose Per Fraction: 3 Gy
Plan Total Fractions Prescribed: 10
Plan Total Prescribed Dose: 30 Gy
Reference Point Dosage Given to Date: 30 Gy
Reference Point Session Dosage Given: 3 Gy
Session Number: 10

## 2023-06-29 MED ORDER — SODIUM CHLORIDE 0.9% FLUSH
10.0000 mL | INTRAVENOUS | Status: DC
  Administered 2023-06-29: 10 mL via INTRAVENOUS

## 2023-06-29 MED ORDER — HEPARIN SOD (PORK) LOCK FLUSH 100 UNIT/ML IV SOLN
500.0000 [IU] | Freq: Once | INTRAVENOUS | Status: AC
  Administered 2023-06-29: 500 [IU] via INTRAVENOUS

## 2023-06-29 MED ORDER — TECHNETIUM TC 99M MEDRONATE IV KIT
20.0000 | PACK | Freq: Once | INTRAVENOUS | Status: AC | PRN
  Administered 2023-06-29: 20.5 via INTRAVENOUS

## 2023-06-29 NOTE — Progress Notes (Signed)
Port accesses using sterile technique for administration of bone scan tracer by Radiology.  Tolerated procedure well.  Flushed per protocol with 500 units of heparin and 10 ml of 0.9 NS.  Excellent blood return and flushed well.  Patient discharged ambulatory in stable condition.

## 2023-06-29 NOTE — Progress Notes (Signed)
Radiation Oncology         [phone]) [phone] ________________________________  Name: Denise [name]  WJX:[id number]  Date of Service: 06/29/23  DOB: 12-19-1950   Steroid Taper Instructions   You currently have a prescription for Dexamethasone 4 mg Tablets.   Beginning 07/09/23: Take 1/2 of a tablet (which is 2 mg) twice a day  Beginning 07/16/23: Take 1/2 of a tablet (which is 2 mg) once a day  Beginning 07/23/23: Take 1/2 of a tablet (which is 2 mg) every other day and stop on 08/07/23.   Please call our office if you have any headaches, visual changes, uncontrolled movements, extremity weakness, nausea or vomiting.

## 2023-06-30 ENCOUNTER — Telehealth: Payer: Self-pay | Admitting: *Deleted

## 2023-06-30 NOTE — Radiation Completion Notes (Signed)
Radiation Oncology         [phone] ________________________________  Name: Denise [name] MRN: [id number]  Date of Service: 06/30/2023  DOB: 1951-04-07  End of Treatment Note     Diagnosis: Progressive Metastatic Stage IIIC, cT3N3c triple negative invasive lobular carcinoma of the left breast with bone, and recent evidence of leptomeningeal disease   Intent: Palliative     ==========DELIVERED PLANS==========  First Treatment Date: 2023-06-15 - Last Treatment Date: 2023-06-29   Plan Name: Brain_Whole Site: Brain Technique: Isodose Plan Mode: Photon Dose Per Fraction: 3 Gy Prescribed Dose (Delivered / Prescribed): 30 Gy / 30 Gy Prescribed Fxs (Delivered / Prescribed): 10 / 10     ==========ON TREATMENT VISIT DATES========== 2023-06-18, 2023-06-25     See weekly On Treatment Notes in Epic for details. The patient tolerated radiation. She did develop dizziness, nausea, and vomiting, and was found to be dehydrated so she was given IVF on 06/22/23. She was started on steroids and continued this during treatment with a taper instruction to begin on 07/09/23.  The patient will receive a call in about one month from the radiation oncology department and be followed in the brain oncology program. She will continue follow up with Dr. Ellin Saba as well.      Osker Mason, PAC

## 2023-06-30 NOTE — Telephone Encounter (Signed)
Spoke with the patient to review her steroid taper instructions.  I let her know that a copy of the instructions are available via mychart as well.  She verbalized understanding of all instructions and will call if she experiences any of the symptoms we discussed.  Lind Covert RN, BSN

## 2023-07-02 ENCOUNTER — Other Ambulatory Visit: Payer: Self-pay

## 2023-07-02 ENCOUNTER — Other Ambulatory Visit: Payer: Self-pay | Admitting: Radiation Therapy

## 2023-07-02 DIAGNOSIS — C7931 Secondary malignant neoplasm of brain: Secondary | ICD-10-CM

## 2023-07-03 ENCOUNTER — Other Ambulatory Visit: Payer: Self-pay

## 2023-07-05 ENCOUNTER — Inpatient Hospital Stay (HOSPITAL_BASED_OUTPATIENT_CLINIC_OR_DEPARTMENT_OTHER): Payer: Medicare Other | Admitting: Hematology

## 2023-07-05 ENCOUNTER — Encounter: Payer: Self-pay | Admitting: Hematology

## 2023-07-05 ENCOUNTER — Inpatient Hospital Stay: Payer: Medicare Other

## 2023-07-05 ENCOUNTER — Ambulatory Visit (HOSPITAL_COMMUNITY)
Admission: RE | Admit: 2023-07-05 | Discharge: 2023-07-05 | Disposition: A | Discharge status: Home / Self Care | Payer: Medicare Other | Source: Ambulatory Visit | Attending: Hematology | Admitting: Hematology

## 2023-07-05 ENCOUNTER — Other Ambulatory Visit: Payer: Self-pay

## 2023-07-05 ENCOUNTER — Other Ambulatory Visit: Payer: Self-pay | Admitting: Hematology

## 2023-07-05 VITALS — BP 106/68 | HR 67 | Temp 98.8°F | Resp 18

## 2023-07-05 DIAGNOSIS — C7931 Secondary malignant neoplasm of brain: Secondary | ICD-10-CM | POA: Diagnosis not present

## 2023-07-05 DIAGNOSIS — C50919 Malignant neoplasm of unspecified site of unspecified female breast: Secondary | ICD-10-CM | POA: Diagnosis not present

## 2023-07-05 DIAGNOSIS — Z510 Encounter for antineoplastic radiation therapy: Secondary | ICD-10-CM | POA: Diagnosis not present

## 2023-07-05 DIAGNOSIS — Z95828 Presence of other vascular implants and grafts: Secondary | ICD-10-CM

## 2023-07-05 DIAGNOSIS — Z79899 Other long term (current) drug therapy: Secondary | ICD-10-CM | POA: Diagnosis not present

## 2023-07-05 DIAGNOSIS — Z171 Estrogen receptor negative status [ER-]: Secondary | ICD-10-CM | POA: Diagnosis not present

## 2023-07-05 DIAGNOSIS — Z5112 Encounter for antineoplastic immunotherapy: Secondary | ICD-10-CM | POA: Diagnosis not present

## 2023-07-05 DIAGNOSIS — C50912 Malignant neoplasm of unspecified site of left female breast: Secondary | ICD-10-CM

## 2023-07-05 DIAGNOSIS — C7951 Secondary malignant neoplasm of bone: Secondary | ICD-10-CM | POA: Diagnosis not present

## 2023-07-05 DIAGNOSIS — M898X9 Other specified disorders of bone, unspecified site: Secondary | ICD-10-CM | POA: Diagnosis not present

## 2023-07-05 LAB — CBC WITH DIFFERENTIAL/PLATELET
Abs Immature Granulocytes: 0.15 10*3/uL — ABNORMAL HIGH (ref 0.00–0.07)
Basophils Absolute: 0 10*3/uL (ref 0.0–0.1)
Basophils Relative: 0 %
Eosinophils Absolute: 0 10*3/uL (ref 0.0–0.5)
Eosinophils Relative: 0 %
HCT: 33.1 % — ABNORMAL LOW (ref 36.0–46.0)
Hemoglobin: 11.1 g/dL — ABNORMAL LOW (ref 12.0–15.0)
Immature Granulocytes: 1 %
Lymphocytes Relative: 4 %
Lymphs Abs: 0.4 10*3/uL — ABNORMAL LOW (ref 0.7–4.0)
MCH: 37.5 pg — ABNORMAL HIGH (ref 26.0–34.0)
MCHC: 33.5 g/dL (ref 30.0–36.0)
MCV: 111.8 fL — ABNORMAL HIGH (ref 80.0–100.0)
Monocytes Absolute: 0.4 10*3/uL (ref 0.1–1.0)
Monocytes Relative: 4 %
Neutro Abs: 9.4 10*3/uL — ABNORMAL HIGH (ref 1.7–7.7)
Neutrophils Relative %: 91 %
Platelets: 206 10*3/uL (ref 150–400)
RBC: 2.96 MIL/uL — ABNORMAL LOW (ref 3.87–5.11)
RDW: 14.7 % (ref 11.5–15.5)
WBC: 10.4 10*3/uL (ref 4.0–10.5)
nRBC: 0 % (ref 0.0–0.2)

## 2023-07-05 LAB — COMPREHENSIVE METABOLIC PANEL
ALT: 15 U/L (ref 0–44)
AST: 13 U/L — ABNORMAL LOW (ref 15–41)
Albumin: 3.3 g/dL — ABNORMAL LOW (ref 3.5–5.0)
Alkaline Phosphatase: 90 U/L (ref 38–126)
Anion gap: 5 (ref 5–15)
BUN: 27 mg/dL — ABNORMAL HIGH (ref 8–23)
CO2: 25 mmol/L (ref 22–32)
Calcium: 8 mg/dL — ABNORMAL LOW (ref 8.9–10.3)
Chloride: 105 mmol/L (ref 98–111)
Creatinine, Ser: 0.75 mg/dL (ref 0.44–1.00)
GFR, Estimated: >60 mL/min (ref >60)
Glucose, Bld: 99 mg/dL (ref 70–99)
Potassium: 4.1 mmol/L (ref 3.5–5.1)
Sodium: 135 mmol/L (ref 135–145)
Total Bilirubin: 0.3 mg/dL (ref 0.3–1.2)
Total Protein: 6.2 g/dL — ABNORMAL LOW (ref 6.5–8.1)

## 2023-07-05 LAB — MAGNESIUM: Magnesium: 1.8 mg/dL (ref 1.7–2.4)

## 2023-07-05 MED ORDER — SODIUM CHLORIDE 0.9% FLUSH
10.0000 mL | INTRAVENOUS | Status: DC | PRN
  Administered 2023-07-05: 10 mL via INTRAVENOUS

## 2023-07-05 MED ORDER — ONDANSETRON HCL 4 MG PO TABS: 4.0000 mg | ORAL_TABLET | Freq: Three times a day (TID) | ORAL | 3 refills | Status: DC | PRN

## 2023-07-05 MED ORDER — FAM-TRASTUZUMAB DERUXTECAN-NXKI CHEMO 100 MG IV SOLR
4.2000 mg/kg | Freq: Once | INTRAVENOUS | Status: AC
  Administered 2023-07-05: 300 mg via INTRAVENOUS
  Filled 2023-07-05: qty 15

## 2023-07-05 MED ORDER — ACETAMINOPHEN 325 MG PO TABS
650.0000 mg | ORAL_TABLET | Freq: Once | ORAL | Status: AC
  Administered 2023-07-05: 650 mg via ORAL
  Filled 2023-07-05: qty 2

## 2023-07-05 MED ORDER — DEXAMETHASONE SODIUM PHOSPHATE 100 MG/10ML IJ SOLN: 10.0000 mg | Freq: Once | INTRAMUSCULAR | Status: DC

## 2023-07-05 MED ORDER — CETIRIZINE HCL 10 MG/ML IV SOLN
10.0000 mg | Freq: Once | INTRAVENOUS | Status: AC
  Administered 2023-07-05: 10 mg via INTRAVENOUS
  Filled 2023-07-05: qty 1

## 2023-07-05 MED ORDER — PALONOSETRON HCL INJECTION 0.25 MG/5ML
0.2500 mg | Freq: Once | INTRAVENOUS | Status: AC
  Administered 2023-07-05: 0.25 mg via INTRAVENOUS
  Filled 2023-07-05: qty 5

## 2023-07-05 MED ORDER — DEXTROSE 5 % IV SOLN: Freq: Once | INTRAVENOUS | Status: AC

## 2023-07-05 MED ORDER — HEPARIN SOD (PORK) LOCK FLUSH 100 UNIT/ML IV SOLN
500.0000 [IU] | Freq: Once | INTRAVENOUS | Status: AC | PRN
  Administered 2023-07-05: 500 [IU]

## 2023-07-05 MED ORDER — DEXAMETHASONE SODIUM PHOSPHATE 10 MG/ML IJ SOLN
10.0000 mg | Freq: Once | INTRAMUSCULAR | Status: AC
  Administered 2023-07-05: 10 mg via INTRAVENOUS
  Filled 2023-07-05: qty 1

## 2023-07-05 MED ORDER — SODIUM CHLORIDE 0.9 % IV SOLN
150.0000 mg | Freq: Once | INTRAVENOUS | Status: AC
  Administered 2023-07-05: 150 mg via INTRAVENOUS
  Filled 2023-07-05: qty 5

## 2023-07-05 NOTE — Progress Notes (Signed)
Patients port flushed without difficulty.  Good blood return noted with no bruising or swelling noted at site.  Patient remains accessed for treatment.  

## 2023-07-05 NOTE — Patient Instructions (Signed)
[facility]  Discharge Instructions: Thank you for choosing [facility] to provide your oncology and hematology care.  If you have a lab appointment with the Cancer Center - please note that after April 8th, 2024, all labs will be drawn in the cancer center.  You do not have to check in or register with the main entrance as you have in the past but will complete your check-in in the cancer center.  Wear comfortable clothing and clothing appropriate for easy access to any Portacath or PICC line.   We strive to give you quality time with your provider. You may need to reschedule your appointment if you arrive late (15 or more minutes).  Arriving late affects you and other patients whose appointments are after yours.  Also, if you miss three or more appointments without notifying the office, you may be dismissed from the clinic at the provider's discretion.      For prescription refill requests, have your pharmacy contact our office and allow 72 hours for refills to be completed.    Today you received the following chemotherapy and/or immunotherapy agents Enhertu      To help prevent nausea and vomiting after your treatment, we encourage you to take your nausea medication as directed.  BELOW ARE SYMPTOMS THAT SHOULD BE REPORTED IMMEDIATELY: *FEVER GREATER THAN 100.4 F (38 C) OR HIGHER *CHILLS OR SWEATING *NAUSEA AND VOMITING THAT IS NOT CONTROLLED WITH YOUR NAUSEA MEDICATION *UNUSUAL SHORTNESS OF BREATH *UNUSUAL BRUISING OR BLEEDING *URINARY PROBLEMS (pain or burning when urinating, or frequent urination) *BOWEL PROBLEMS (unusual diarrhea, constipation, pain near the anus) TENDERNESS IN MOUTH AND THROAT WITH OR WITHOUT PRESENCE OF ULCERS (sore throat, sores in mouth, or a toothache) UNUSUAL RASH, SWELLING OR PAIN  UNUSUAL VAGINAL DISCHARGE OR ITCHING   Items with * indicate a potential emergency and should be followed up as soon as possible or go to the  Emergency Department if any problems should occur.  Please show the CHEMOTHERAPY ALERT CARD or IMMUNOTHERAPY ALERT CARD at check-in to the Emergency Department and triage nurse.  Should you have questions after your visit or need to cancel or reschedule your appointment, please contact [facility] AT [facility] [phone]  and follow the prompts.  Office hours are 8:00 a.m. to 4:30 p.m. Monday - Friday. Please note that voicemails left after 4:00 p.m. may not be returned until the following business day.  We are closed weekends and major holidays. You have access to a nurse at all times for urgent questions. Please call the main number to the clinic [phone] and follow the prompts.  For any non-urgent questions, you may also contact your provider using MyChart. We now offer e-Visits for anyone 35 and older to request care online for non-urgent symptoms. For details visit [email or web address].   Also download the MyChart app! Go to the app store, search "MyChart", open the app, select [place], and log in with your MyChart username and password.

## 2023-07-05 NOTE — Progress Notes (Signed)
Patient tolerated treatment well with no complaints voiced.  Side effects with management reviewed with understanding verbalized.  Port site clean and dry with no bruising or swelling noted at site.  Good blood return noted before and after administration of chemotherapy.  Band aid applied.  Patient left in satisfactory condition with VSS and no s/s of distress noted. All follow ups as scheduled.   Aunya Lemler [employer]

## 2023-07-05 NOTE — Patient Instructions (Signed)
[facility] at [facility] Discharge Instructions   You were seen and examined today by Dr. Ellin Saba.  He reviewed the results of your lab work which are normal/stable.   He reviewed the results of your bone scan. It is showing areas of spread to the bones, some areas we already knew about. Some areas are questionable, and may be new. We will use this study as a baseline so we can monitor how the treatment is doing.   We will proceed with your treatment today.   Return as scheduled.    Thank you for choosing [facility] at [facility] to provide your oncology and hematology care.  To afford each patient quality time with our provider, please arrive at least 15 minutes before your scheduled appointment time.   If you have a lab appointment with the Cancer Center please come in thru the Main Entrance and check in at the main information desk.  You need to re-schedule your appointment should you arrive 10 or more minutes late.  We strive to give you quality time with our providers, and arriving late affects you and other patients whose appointments are after yours.  Also, if you no show three or more times for appointments you may be dismissed from the clinic at the providers discretion.     Again, thank you for choosing [facility].  Our hope is that these requests will decrease the amount of time that you wait before being seen by our physicians.       _____________________________________________________________  Should you have questions after your visit to [facility], please contact our office at [phone] and follow the prompts.  Our office hours are 8:00 a.m. and 4:30 p.m. Monday - Friday.  Please note that voicemails left after 4:00 p.m. may not be returned until the following business day.  We are closed weekends and major holidays.  You do have access to a nurse 24-7, just call the main number to the clinic  [phone] and do not press any options, hold on the line and a nurse will answer the phone.    For prescription refill requests, have your pharmacy contact our office and allow 72 hours.    Due to Covid, you will need to wear a mask upon entering the hospital. If you do not have a mask, a mask will be given to you at the Main Entrance upon arrival. For doctor visits, patients may have 1 support person age 35 or older with them. For treatment visits, patients can not have anyone with them due to social distancing guidelines and our immunocompromised population.

## 2023-07-05 NOTE — Progress Notes (Signed)
[facility] [address] [place] [address]    Clinic Day:  07/05/2023  Referring physician: Toma Deiters, MD  Patient Care Team: Toma Deiters, MD as PCP - General (Internal Medicine) [name], MD as Medical Oncologist (Medical Oncology)   ASSESSMENT & PLAN:   Assessment: 1.  T3N3c (stage IIIc) triple negative invasive lobular carcinoma of the left breast: - She felt lump in her left breast for more than 6 months, but thought it was secondary to fibrocystic disease which she had all her life.  When she started having pain, she reached out to Dr. Olena Leatherwood. - Ultrasound-guided left breast and left axillary lymph node biopsy on 01/15/2021 - Pathology consistent with invasive lobular carcinoma, E-cadherin negative.  ER/PR/HER2 negative.  HER2 2+ by IHC, negative by FISH.  Ki-67 is 5%.  Lymph node core biopsy was consistent with metastatic carcinoma.  Grade 2. - PET scan on 02/03/2021 showed involvement of left supraclavicular, subpectoral, axillary lymph nodes along with the breast mass.  10 mm left lung nodule which is hypometabolic.  Spinal cord lesion at T12 level with a strong uptake. - MRI of the lumbar spine with and without contrast on 02/20/2021 showed no mass or abnormal enhancement within the canal at the T12 level to correspond to the site of PET scan positive.  No marrow replacing bone lesion. - 12 weeks of carboplatin and paclitaxel weekly and every 3 weeks pembrolizumab followed by 4 cycles of dose dense AC with pembrolizumab ([id number]) from 02/27/2021 through 10/01/2021 - PET scan on 10/16/2021: Reduction in metabolic activity in the size of the left axillary lymph node and no evidence of breast hypermetabolism on PET scan. - Left mastectomy and lymph node excision by Dr. Magnus Ivan on 11/20/2021 - Pathology: 6.2 cm invasive lobular carcinoma, grade 2, margins negative.  19/21 lymph nodes involved with macrometastasis.  ypT3, YPN3A - Adjuvant  pembrolizumab started on 01/01/2022. -CREATE-X capecitabine 1500 mg twice daily 2 weeks on/1 week off started on 01/01/2022.  Last dose of pembrolizumab on 05/11/2022 - Germline mutation testing ([facility]) negative - [employer] (06/04/2022): T p53, K-ras V14 I, CDKN2A.  MSI-high not detected. - PET scan (05/28/2022): Multifocal bone lesions involving T11 vertebral body, right posterior sixth rib, left femoral neck, left scapular glenoid.  No other metastatic disease. - Sacituzumab cycle 1 on 08/18/2022.  She was hospitalized with neutropenic fever and severe diarrhea from 08/24/2022 through 08/27/2022. - CTAP (10/25/2021): Generalized mild to moderate wall thickening throughout the nondistended large bowel with faint pericolonic fat haziness compatible with nonspecific infectious/inflammatory colitis.  No free air.  C. difficile x 2 was negative. - She has UG T1 A1*28 heterozygosity. - Cycle 2 Sacituzumab dose reduced by 50%.  Discontinued secondary to poor tolerance. - Enhertu 3.2 Mg/KG cycle 1 on 11/04/2022 - WBRT 30 Gray in 10 fractions from 06/15/2023 through 06/29/2023   2.  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  She is non-smoker. - Mother died of breast cancer.  Maternal grandmother died very young in her 24s, sister has fibrocystic disease.  Father died of lung cancer and was a smoker.    Plan: 1.  Metastatic TNBC to the bones: - CT CAP (06/08/2023): Left iliac wing lytic metastasis slightly increased in size measuring 2.4 cm, previously 2.1 cm.  Slightly increased lytic lesion of the right iliac crest.  No evidence of visceral metastatic disease. - We reviewed bone scan from 06/29/2023:  Osseous metastatic disease in the axial, appendicular skeleton including frontal calvarium, thoracic spine, sacrum, bilateral iliac crest, right acetabulum, right femur and possibly right proximal tibia. - I have recommended x-ray of the right femur to  see if she needs a IM nailing. - Reviewed labs today: Normal LFTs and creatinine.  CBC grossly normal. - Proceed with treatment today.  RTC 3 weeks for follow-up.  I will also repeat echocardiogram prior to next visit.  2.  Brain lesions (SRS to 3 brain lesions on 06/16/2022): - WBRT completed on 06/29/2023. - She is taking dexamethasone 4 mg twice daily.  She is beginning to taper off dexamethasone by 08/07/2023.  3.  Peripheral neuropathy: - Continue gabapentin 300 mg at bedtime.  4.  Hypomagnesemia: - Continue magnesium 1 tablet daily.  Magnesium is normal today.    No orders of the defined types were placed in this encounter.      [name], MD   10/21/202412:00 PM  CHIEF COMPLAINT:   Diagnosis: Metastatic triple negative breast cancer to the bones    Cancer Staging  Invasive lobular carcinoma of left breast in female [name]) Staging form: Breast, AJCC 8th Edition - Clinical stage from 01/23/2021: cT3, cN3c, G2, ER-, PR-, HER2- - Unsigned - Pathologic stage from 12/22/2021: No Stage Recommended (ypT3, pN3a, cM0, G2, ER-, PR-, HER2-) - Signed by [name], MD on 12/22/2021    Prior Therapy: 1.  Neoadjuvant chemotherapy with weekly carboplatin and paclitaxel and dose dense AC with pembrolizumab 2. Left mastectomy and lymph node excision by Dr. Magnus Ivan on 11/20/2021 3.  Sacituzumab on 08/18/2022 and 10/08/2021, discontinued due to poor tolerance.  Current Therapy:  Enhertu every 21 days    HISTORY OF PRESENT ILLNESS:   Oncology History  Invasive lobular carcinoma of left breast in female [name])  01/23/2021 Initial Diagnosis   Invasive lobular carcinoma of left breast in female [name])   02/19/2021 Genetic Testing   Negative genetic testing on the CancerNext-Expanded+RNAinsight panel.  SMARCB1 VUS identified.  The CancerNext-Expanded gene panel offered by [name] and includes sequencing and rearrangement analysis for the following 77 genes: AIP, ALK,  APC*, ATM*, AXIN2, BAP1, BARD1, BLM, BMPR1A, BRCA1*, BRCA2*, BRIP1*, CDC73, CDH1*, CDK4, CDKN1B, CDKN2A, CHEK2*, CTNNA1, DICER1, FANCC, FH, FLCN, GALNT12, KIF1B, LZTR1, MAX, MEN1, MET, MLH1*, MSH2*, MSH3, MSH6*, MUTYH*, NBN, NF1*, NF2, NTHL1, PALB2*, PHOX2B, PMS2*, POT1, PRKAR1A, PTCH1, PTEN*, RAD51C*, RAD51D*, RB1, RECQL, RET, SDHA, SDHAF2, SDHB, SDHC, SDHD, SMAD4, SMARCA4, SMARCB1, SMARCE1, STK11, SUFU, TMEM127, TP53*, TSC1, TSC2, VHL and XRCC2 (sequencing and deletion/duplication); EGFR, EGLN1, HOXB13, KIT, MITF, PDGFRA, POLD1, and POLE (sequencing only); EPCAM and GREM1 (deletion/duplication only). DNA and RNA analyses performed for * genes. The report date is February 19, 2021.   02/27/2021 - 05/11/2022 Chemotherapy   Patient is on Treatment Plan : BREAST Pembrolizumab + Carboplatin D1,8,15+ Paclitaxel D1,8,15 q21d X 4 cycles / Pembrolizumab + AC q21d x 4 cycles     12/22/2021 Cancer Staging   Staging form: Breast, AJCC 8th Edition - Pathologic stage from 12/22/2021: No Stage Recommended (ypT3, pN3a, cM0, G2, ER-, PR-, HER2-) - Signed by [name], MD on 12/22/2021 Histopathologic type: Lobular carcinoma, NOS Stage prefix: Post-therapy Method of lymph node assessment: Axillary lymph node dissection Multigene prognostic tests performed: None Histologic grading system: 3 grade system   08/18/2022 - 10/08/2022 Chemotherapy   Patient is on Treatment Plan : BREAST METASTATIC Sacituzumab govitecan-hziy Drinda Butts) D1,8 q21d     11/04/2022 -  Chemotherapy   Patient is on Treatment  Plan : BREAST METASTATIC Fam-Trastuzumab Deruxtecan-nxki (Enhertu) (5.4) q21d        INTERVAL HISTORY:   Denise [name] is a 72 y.o. female seen for follow-up of metastatic triple negative breast cancer.  Reports appetite 100% and energy levels of 50%.  Denies any diarrhea from Enhertu.  She has completed radiation therapy to the brain on 06/29/2023.  Currently takes dexamethasone twice daily.  PAST MEDICAL HISTORY:   Past  Medical History: Past Medical History:  Diagnosis Date   Asthma    as child   Cancer (HCC) 12/2020   left breast IMC   Complication of anesthesia    pateitn states,' I coded when I had my D&Cmany years ago.   Dyspnea    Family history of breast cancer    Hypertension    Hypothyroidism    PONV (postoperative nausea and vomiting)    Port-A-Cath in place 02/26/2021    Surgical History: Past Surgical History:  Procedure Laterality Date   ABDOMINAL HYSTERECTOMY     BIOPSY  01/17/2018   Procedure: BIOPSY;  Surgeon: Malissa Hippo, MD;  [facility];  Service: Endoscopy;;  duodenum,gastric   CHOLECYSTECTOMY     COLONOSCOPY WITH PROPOFOL N/A 01/17/2018   Procedure: COLONOSCOPY WITH PROPOFOL;  Surgeon: Malissa Hippo, MD;  [facility];  Service: Endoscopy;  Laterality: N/A;  7:30   DILATION AND CURETTAGE OF UTERUS     ESOPHAGOGASTRODUODENOSCOPY (EGD) WITH PROPOFOL N/A 01/17/2018   Procedure: ESOPHAGOGASTRODUODENOSCOPY (EGD) WITH PROPOFOL;  Surgeon: Malissa Hippo, MD;  [facility];  Service: Endoscopy;  Laterality: N/A;   POLYPECTOMY  01/17/2018   Procedure: POLYPECTOMY;  Surgeon: Malissa Hippo, MD;  [facility];  Service: Endoscopy;;  transverse colon, cecal   PORTACATH PLACEMENT Right 02/17/2021   Procedure: INSERTION PORT-A-CATH;  Surgeon: Abigail Miyamoto, MD;  Location: [facility];  Service: General;  Laterality: Right;   RADIOACTIVE SEED GUIDED AXILLARY SENTINEL LYMPH NODE Left 11/20/2021   Procedure: RADIOACTIVE SEED GUIDED LEFT AXILLARY SENTINEL LYMPH NODE DISSECTION;  Surgeon: Abigail Miyamoto, MD;  Location: [facility];  Service: General;  Laterality: Left;   TOTAL MASTECTOMY Left 11/20/2021   Procedure: LEFT TOTAL MASTECTOMY;  Surgeon: Abigail Miyamoto, MD;  Location: [facility];  Service: General;  Laterality: Left;    Social History: Social History   Socioeconomic History   Marital  status: Widowed    Spouse name: Not on file   Number of children: 3   Years of education: Not on file   Highest education level: Not on file  Occupational History   Occupation: [employer]   Occupation: retired  Tobacco Use   Smoking status: Never   Smokeless tobacco: Never  Vaping Use   Vaping status: Never Used  Substance and Sexual Activity   Alcohol use: Never   Drug use: Never   Sexual activity: Not Currently    Birth control/protection: Surgical  Other Topics Concern   Not on file  Social History Narrative   Not on file   Social Determinants of Health   Financial Resource Strain: Low Risk  (07/10/2022)   Overall Financial Resource Strain (CARDIA)    Difficulty of Paying Living Expenses: Not hard at all  Food Insecurity: No Food Insecurity (06/30/2022)   Hunger Vital Sign    Worried About Running Out of Food in the Last Year: Never true    Ran Out of Food in the Last Year: Never  true  Transportation Needs: No Transportation Needs (07/10/2022)   PRAPARE - Administrator, Civil Service (Medical): No    Lack of Transportation (Non-Medical): No  Physical Activity: Insufficiently Active (01/22/2021)   Exercise Vital Sign    Days of Exercise per Week: 3 days    Minutes of Exercise per Session: 30 min  Stress: No Stress Concern Present (01/22/2021)   [employer] of Occupational Health - Occupational Stress Questionnaire    Feeling of Stress : Not at all  Social Connections: Moderately Integrated (01/22/2021)   Social Connection and Isolation Panel [NHANES]    Frequency of Communication with Friends and Family: More than three times a week    Frequency of Social Gatherings with Friends and Family: More than three times a week    Attends Religious Services: More than 4 times per year    Active Member of [employer] or Organizations: No    Attends Engineer, structural: More than 4 times per year    Marital Status: Widowed  Intimate  Partner Violence: Not At Risk (05/28/2022)   Humiliation, Afraid, Rape, and Kick questionnaire    Fear of Current or Ex-Partner: No    Emotionally Abused: No    Physically Abused: No    Sexually Abused: No    Family History: Family History  Problem Relation Age of Onset   Breast cancer Mother        dx in her 33s   Heart disease Mother    Thyroid disease Mother    Lung cancer Father        dx in his 9s   Thyroid disease Maternal Aunt    Thyroid nodules Maternal Grandmother 35       goiter   Thyroid disease Maternal Grandmother    Heart disease Maternal Grandfather    Heart disease Paternal Grandmother    Heart disease Paternal Grandfather    Thyroid disease Daughter    Thyroid disease Daughter    Cancer Maternal Uncle        NOS   Cancer Paternal Uncle        NOS   Breast cancer Cousin        pat first cousin died in her 97s;     Current Medications:  Current Outpatient Medications:    Acetaminophen (TYLENOL PO), Take 1 tablet by mouth as needed., Disp: , Rfl:    dexamethasone (DECADRON) 4 MG tablet, Take 1 tablet (4 mg total) by mouth 2 (two) times daily with a meal., Disp: 60 tablet, Rfl: 1   gabapentin (NEURONTIN) 300 MG capsule, Take 1 capsule by mouth 2 (two) times daily., Disp: , Rfl:    hydrALAZINE (APRESOLINE) 25 MG tablet, Take 25 mg by mouth 3 (three) times daily., Disp: , Rfl:    Lactulose 20 GM/30ML SOLN, Take 15 mLs (10 g total) by mouth at bedtime as needed., Disp: 450 mL, Rfl: 0   levothyroxine (SYNTHROID) 75 MCG tablet, Take 75 mcg by mouth daily before breakfast., Disp: , Rfl:    lidocaine (XYLOCAINE) 2 % solution, Use as directed 15 mLs in the mouth or throat as needed (reflux)., Disp: 100 mL, Rfl: 2   Melatonin 5 MG TABS, Take 10 mg by mouth. As needed for sleep, Disp: , Rfl:    metoCLOPramide (REGLAN) 10 MG tablet, Take 1 tablet (10 mg total) by mouth every 6 (six) hours as needed for nausea or vomiting., Disp: 30 tablet, Rfl: 0   midodrine  (PROAMATINE) 10  MG tablet, Take 10 mg by mouth daily., Disp: , Rfl:    olmesartan-hydrochlorothiazide (BENICAR HCT) 40-25 MG tablet, Take 1 tablet by mouth daily., Disp: , Rfl:    pantoprazole (PROTONIX) 40 MG tablet, Take 1 tablet (40 mg total) by mouth daily., Disp: 60 tablet, Rfl: 3   promethazine (PHENERGAN) 25 MG suppository, Place 1 suppository (25 mg total) rectally every 6 (six) hours as needed for nausea or vomiting., Disp: 12 each, Rfl: 0   sucralfate (CARAFATE) 1 g tablet, Take 1 tablet (1 g total) by mouth every 4 (four) hours as needed., Disp: 180 tablet, Rfl: 0   Allergies: Allergies  Allergen Reactions   Exforge [Amlodipine Besylate-Valsartan] Nausea And Vomiting   Gadolinium Derivatives Anaphylaxis, Hives and Itching    Pt should not have MRI contrast in outpt facility. Pt developed hives and itching AFTER taking 13 hr prep. Per Dr. Charise Killian. 04/23/23   Gadopiclenol Hives and Itching    13 hr prep prior to contrast admin    Tape Other (See Comments)    Redness and Irriatation   Cheese     Hard cheese   Levofloxacin In D5w Hives   Other    Proanthocyanidin    Strawberry Extract Diarrhea    Seeds,nuts, lettuce, grapes   Wild Lettuce Extract (Lactuca Virosa)    Yeast-Derived Drug Products Hives    Mold on bread    REVIEW OF SYSTEMS:   Review of Systems  Constitutional:  Positive for fatigue. Negative for chills and fever.  HENT:   Negative for lump/mass, mouth sores, nosebleeds and trouble swallowing.   Eyes:  Negative for eye problems.  Respiratory:  Negative for cough.   Cardiovascular:  Positive for palpitations. Negative for leg swelling.  Gastrointestinal:  Negative for abdominal pain, constipation, diarrhea, nausea and vomiting.  Genitourinary:  Negative for bladder incontinence, difficulty urinating, dysuria, frequency, hematuria and nocturia.   Musculoskeletal:  Negative for arthralgias, back pain, flank pain, myalgias and neck pain.  Skin:  Negative for itching  and rash.  Neurological:  Positive for headaches and numbness. Negative for dizziness.  Hematological:  Does not bruise/bleed easily.  Psychiatric/Behavioral:  Negative for depression, sleep disturbance and suicidal ideas. The patient is not nervous/anxious.   All other systems reviewed and are negative.    VITALS:   There were no vitals taken for this visit.  Wt Readings from Last 3 Encounters:  07/05/23 162 lb 11.2 oz (73.8 kg)  06/14/23 164 lb (74.4 kg)  05/27/23 161 lb 6.4 oz (73.2 kg)    There is no height or weight on file to calculate BMI.  Performance status (ECOG): 1 - Symptomatic but completely ambulatory  PHYSICAL EXAM:   Physical Exam Vitals and nursing note reviewed. Exam conducted with a chaperone present.  Constitutional:      Appearance: Normal appearance.  Cardiovascular:     Rate and Rhythm: Normal rate and regular rhythm.     Pulses: Normal pulses.     Heart sounds: Normal heart sounds.  Pulmonary:     Effort: Pulmonary effort is normal.     Breath sounds: Normal breath sounds.  Abdominal:     Palpations: Abdomen is soft. There is no hepatomegaly, splenomegaly or mass.     Tenderness: There is no abdominal tenderness.  Musculoskeletal:     Right lower leg: No edema.     Left lower leg: No edema.  Lymphadenopathy:     Cervical: No cervical adenopathy.     Right cervical: No  superficial, deep or posterior cervical adenopathy.    Left cervical: No superficial, deep or posterior cervical adenopathy.     Upper Body:     Right upper body: No supraclavicular or axillary adenopathy.     Left upper body: No supraclavicular or axillary adenopathy.  Neurological:     General: No focal deficit present.     Mental Status: She is alert and oriented to person, place, and time.  Psychiatric:        Mood and Affect: Mood normal.        Behavior: Behavior normal.     LABS:      Latest Ref Rng & Units 07/05/2023   10:58 AM 06/22/2023    2:02 PM 06/08/2023    10:22 AM  CBC  WBC 4.0 - 10.5 K/uL 10.4  34.8  3.5   Hemoglobin 12.0 - 15.0 g/dL 16.1  09.6  9.7   Hematocrit 36.0 - 46.0 % 33.1  32.1  30.4   Platelets 150 - 400 K/uL 206  191  225       Latest Ref Rng & Units 07/05/2023   10:58 AM 06/22/2023    2:02 PM 06/08/2023   10:22 AM  CMP  Glucose 70 - 99 mg/dL 99  045  88   BUN 8 - 23 mg/dL 27  26  13    Creatinine 0.44 - 1.00 mg/dL 4.09  8.11  9.14   Sodium 135 - 145 mmol/L 135  137  135   Potassium 3.5 - 5.1 mmol/L 4.1  4.4  3.7   Chloride 98 - 111 mmol/L 105  106  104   CO2 22 - 32 mmol/L 25  24  22    Calcium 8.9 - 10.3 mg/dL 8.0  9.2  8.4   Total Protein 6.5 - 8.1 g/dL 6.2  7.0  6.3   Total Bilirubin 0.3 - 1.2 mg/dL 0.3  0.2  0.6   Alkaline Phos 38 - 126 U/L 90  202  122   AST 15 - 41 U/L 13  14  17    ALT 0 - 44 U/L 15  13  14       No results found for: "CEA1", "CEA" / No results found for: "CEA1", "CEA" No results found for: "PSA1" No results found for: "NWG956" No results found for: "CAN125"  No results found for: "TOTALPROTELP", "ALBUMINELP", "A1GS", "A2GS", "BETS", "BETA2SER", "GAMS", "MSPIKE", "SPEI" No results found for: "TIBC", "FERRITIN", "IRONPCTSAT" No results found for: "LDH"   STUDIES:   NM Bone Scan Whole Body  Result Date: 07/05/2023 CLINICAL DATA:  Metastatic disease evaluation EXAM: NUCLEAR MEDICINE WHOLE BODY BONE SCAN TECHNIQUE: Whole body anterior and posterior images were obtained approximately 3 hours after intravenous injection of radiopharmaceutical. RADIOPHARMACEUTICALS:  20.5 mCi Technetium-72m MDP IV COMPARISON:  Brain MR 04/23/23, CT CAP 06/08/23 FINDINGS: There is a focal region of increased tracer uptake along the midline frontal calvarium, suspicious for a calvarial metastatic lesion. There is also increased tracer uptake along the posterior right sixth rib and anterior right 2/3 rib, worrisome for osseous metastatic disease Increased tracer uptake of the T9 vertebral body is compatible with known  metastatic disease. There is also increased tracer uptake over the sacrum Tracer uptake along the bilateral iliac crests and asymmetrically increased tracer uptake along the left acetabulum is suspicious for osseous metastatic disease. There is also asymmetrically increased tracer uptake along the proximal right femur in the proximal tibia. Recommend correlation with dedicated radiographs fixed with the possibility of  osseous metastatic lesions. Nonspecific tracer uptake in the region of the right glenoid without a definite correlate on prior CT chest abdomen pelvis dated 06/08/2023. There is degenerative uptake around the bilateral ankle joints, bilateral humeral heads, and bilateral elbow joints. IMPRESSION: No prior scintigraphic imaging was available for comparison at the time of dictation. Within this limitation, there is evidence of osseous metastatic disease in the axial and appendicular skeleton, including the frontal calvarium, thoracic spine, sacrum, bilateral iliac crests, the right acetabulum, right femur, and possibly the right proximal tibia. Electronically Signed   By: Lorenza Cambridge M.D.   On: 07/05/2023 08:49   CT CHEST ABDOMEN PELVIS W CONTRAST  Result Date: 06/13/2023 CLINICAL DATA:  Metastatic breast cancer restaging, assess treatment response * Tracking Code: BO * EXAM: CT CHEST, ABDOMEN, AND PELVIS WITH CONTRAST TECHNIQUE: Multidetector CT imaging of the chest, abdomen and pelvis was performed following the standard protocol during bolus administration of intravenous contrast. RADIATION DOSE REDUCTION: This exam was performed according to the departmental dose-optimization program which includes automated exposure control, adjustment of the mA and/or kV according to patient size and/or use of iterative reconstruction technique. CONTRAST:  OMNIPAQUE IOHEXOL 300 MG/ML  SOLN COMPARISON:  CT abdomen pelvis, 03/28/2023, CT chest abdomen pelvis, 01/29/2019 FINDINGS: CT CHEST FINDINGS  Cardiovascular: Right chest port catheter. Cardiomegaly. No pericardial effusion. Mediastinum/Nodes: No enlarged mediastinal, hilar, or axillary lymph nodes. Small calcified granulomatous mediastinal and left hilar lymph nodes. Thyroid gland, trachea, and esophagus demonstrate no significant findings. Lungs/Pleura: Unchanged occasional small, densely calcified benign granulomatous nodules, for example left upper lobe (series 4, image 61). Unchanged, lobulated, faintly calcified nodule of the superior segment left lower lobe (series 4, image 70). Unchanged, clustered centrilobular and tree-in-bud nodules of the medial left lower lobe (series 4, image 101). These nodules are consistent with benign sequelae of prior infection or inflammation requiring no specific follow-up or characterization. No pleural effusion or pneumothorax. Musculoskeletal: Status post left mastectomy and left axillary lymph node dissection. No acute osseous findings. CT ABDOMEN PELVIS FINDINGS Hepatobiliary: No focal liver abnormality is seen. Status post cholecystectomy. No biliary dilatation. Pancreas: Unremarkable. No pancreatic ductal dilatation or surrounding inflammatory changes. Spleen: Normal in size without significant abnormality. Adrenals/Urinary Tract: Adrenal glands are unremarkable. Kidneys are normal, without renal calculi, solid lesion, or hydronephrosis. Bladder is unremarkable. Stomach/Bowel: Stomach is within normal limits. Appendix not clearly visualized. No evidence of bowel wall thickening, distention, or inflammatory changes. Descending and sigmoid diverticulosis Vascular/Lymphatic: Scattered aortic atherosclerosis. No enlarged abdominal or pelvic lymph nodes. Reproductive: Status post hysterectomy Other: No abdominal wall hernia. Unchanged fluid attenuation subcutaneous inclusion cyst of the ventral left hemiabdomen (series 2, image 64). No ascites. Musculoskeletal: No acute osseous findings. Unchanged sclerotic vertebral  body metastases, including of T8, T11 and S1 (series 7, image 108). Slightly increased lytic character of a small metastasis of the right iliac crest, best appreciated anteriorly (series 2, image 80). Additional lytic metastasis of the left iliac wing is slightly increased in size, measuring 2.4 cm, previously 1.9 cm (series 2, image 79). IMPRESSION: 1. Slightly increased lytic character of a small metastasis of the right iliac crest. Additional lytic metastasis of the left iliac wing is slightly increased in size. Findings consistent with slightly worsened osseous metastatic disease. 2. Unchanged treated sclerotic vertebral body metastases. 3. No evidence of lymphadenopathy or soft tissue metastatic disease in the chest, abdomen, or pelvis. 4. Status post left mastectomy. 5. Cardiomegaly. 6. Descending and sigmoid diverticulosis. Aortic Atherosclerosis (ICD10-I70.0). Electronically Signed  By: Jearld Lesch M.D.   On: 06/13/2023 21:34

## 2023-07-07 ENCOUNTER — Inpatient Hospital Stay: Payer: Medicare Other

## 2023-07-07 VITALS — BP 138/79 | HR 75 | Temp 98.2°F | Resp 18

## 2023-07-07 DIAGNOSIS — Z510 Encounter for antineoplastic radiation therapy: Secondary | ICD-10-CM | POA: Diagnosis not present

## 2023-07-07 DIAGNOSIS — C50912 Malignant neoplasm of unspecified site of left female breast: Secondary | ICD-10-CM

## 2023-07-07 DIAGNOSIS — Z5112 Encounter for antineoplastic immunotherapy: Secondary | ICD-10-CM | POA: Diagnosis not present

## 2023-07-07 DIAGNOSIS — Z95828 Presence of other vascular implants and grafts: Secondary | ICD-10-CM

## 2023-07-07 DIAGNOSIS — Z171 Estrogen receptor negative status [ER-]: Secondary | ICD-10-CM | POA: Diagnosis not present

## 2023-07-07 DIAGNOSIS — C7951 Secondary malignant neoplasm of bone: Secondary | ICD-10-CM | POA: Diagnosis not present

## 2023-07-07 DIAGNOSIS — C7931 Secondary malignant neoplasm of brain: Secondary | ICD-10-CM | POA: Diagnosis not present

## 2023-07-07 MED ORDER — PEGFILGRASTIM-FPGK 6 MG/0.6ML ~~LOC~~ SOSY
6.0000 mg | PREFILLED_SYRINGE | Freq: Once | SUBCUTANEOUS | Status: AC
  Administered 2023-07-07: 6 mg via SUBCUTANEOUS
  Filled 2023-07-07: qty 0.6

## 2023-07-07 NOTE — Progress Notes (Signed)
Denise [name] presents today for Stimufed injection per the provider's orders.  Stable during administration without incident; injection site WNL; see MAR for injection details.  Patient tolerated procedure well and without incident.  No questions or complaints noted at this time.

## 2023-07-07 NOTE — Patient Instructions (Signed)
[facility]  Discharge Instructions: Thank you for choosing [facility] to provide your oncology and hematology care.  If you have a lab appointment with the Cancer Center - please note that after April 8th, 2024, all labs will be drawn in the cancer center.  You do not have to check in or register with the main entrance as you have in the past but will complete your check-in in the cancer center.  Wear comfortable clothing and clothing appropriate for easy access to any Portacath or PICC line.   We strive to give you quality time with your provider. You may need to reschedule your appointment if you arrive late (15 or more minutes).  Arriving late affects you and other patients whose appointments are after yours.  Also, if you miss three or more appointments without notifying the office, you may be dismissed from the clinic at the provider's discretion.      For prescription refill requests, have your pharmacy contact our office and allow 72 hours for refills to be completed.    Today you received the following chemotherapy and/or immunotherapy agents Stimufed      To help prevent nausea and vomiting after your treatment, we encourage you to take your nausea medication as directed.  BELOW ARE SYMPTOMS THAT SHOULD BE REPORTED IMMEDIATELY: *FEVER GREATER THAN 100.4 F (38 C) OR HIGHER *CHILLS OR SWEATING *NAUSEA AND VOMITING THAT IS NOT CONTROLLED WITH YOUR NAUSEA MEDICATION *UNUSUAL SHORTNESS OF BREATH *UNUSUAL BRUISING OR BLEEDING *URINARY PROBLEMS (pain or burning when urinating, or frequent urination) *BOWEL PROBLEMS (unusual diarrhea, constipation, pain near the anus) TENDERNESS IN MOUTH AND THROAT WITH OR WITHOUT PRESENCE OF ULCERS (sore throat, sores in mouth, or a toothache) UNUSUAL RASH, SWELLING OR PAIN  UNUSUAL VAGINAL DISCHARGE OR ITCHING   Items with * indicate a potential emergency and should be followed up as soon as possible or go to the  Emergency Department if any problems should occur.  Please show the CHEMOTHERAPY ALERT CARD or IMMUNOTHERAPY ALERT CARD at check-in to the Emergency Department and triage nurse.  Should you have questions after your visit or need to cancel or reschedule your appointment, please contact [facility] AT [facility] [phone]  and follow the prompts.  Office hours are 8:00 a.m. to 4:30 p.m. Monday - Friday. Please note that voicemails left after 4:00 p.m. may not be returned until the following business day.  We are closed weekends and major holidays. You have access to a nurse at all times for urgent questions. Please call the main number to the clinic [phone] and follow the prompts.  For any non-urgent questions, you may also contact your provider using MyChart. We now offer e-Visits for anyone 92 and older to request care online for non-urgent symptoms. For details visit [email or web address].   Also download the MyChart app! Go to the app store, search "MyChart", open the app, select [facility], and log in with your MyChart username and password.

## 2023-07-10 ENCOUNTER — Emergency Department (HOSPITAL_COMMUNITY): Payer: Medicare Other

## 2023-07-10 ENCOUNTER — Emergency Department (HOSPITAL_COMMUNITY)
Admission: EM | Admit: 2023-07-10 | Discharge: 2023-07-10 | Disposition: A | Discharge status: Home / Self Care | Payer: Medicare Other | Attending: Emergency Medicine | Admitting: Emergency Medicine

## 2023-07-10 ENCOUNTER — Other Ambulatory Visit: Payer: Self-pay

## 2023-07-10 ENCOUNTER — Encounter (HOSPITAL_COMMUNITY): Payer: Self-pay

## 2023-07-10 DIAGNOSIS — R918 Other nonspecific abnormal finding of lung field: Secondary | ICD-10-CM | POA: Diagnosis not present

## 2023-07-10 DIAGNOSIS — R079 Chest pain, unspecified: Secondary | ICD-10-CM | POA: Insufficient documentation

## 2023-07-10 DIAGNOSIS — R0601 Orthopnea: Secondary | ICD-10-CM | POA: Diagnosis not present

## 2023-07-10 DIAGNOSIS — R002 Palpitations: Secondary | ICD-10-CM | POA: Insufficient documentation

## 2023-07-10 DIAGNOSIS — D72829 Elevated white blood cell count, unspecified: Secondary | ICD-10-CM | POA: Insufficient documentation

## 2023-07-10 DIAGNOSIS — I2510 Atherosclerotic heart disease of native coronary artery without angina pectoris: Secondary | ICD-10-CM | POA: Diagnosis not present

## 2023-07-10 DIAGNOSIS — C801 Malignant (primary) neoplasm, unspecified: Secondary | ICD-10-CM | POA: Diagnosis not present

## 2023-07-10 DIAGNOSIS — Z853 Personal history of malignant neoplasm of breast: Secondary | ICD-10-CM | POA: Diagnosis not present

## 2023-07-10 DIAGNOSIS — C7951 Secondary malignant neoplasm of bone: Secondary | ICD-10-CM | POA: Diagnosis not present

## 2023-07-10 LAB — COMPREHENSIVE METABOLIC PANEL
ALT: 18 U/L (ref 0–44)
AST: 13 U/L — ABNORMAL LOW (ref 15–41)
Albumin: 3 g/dL — ABNORMAL LOW (ref 3.5–5.0)
Alkaline Phosphatase: 115 U/L (ref 38–126)
Anion gap: 10 (ref 5–15)
BUN: 35 mg/dL — ABNORMAL HIGH (ref 8–23)
CO2: 23 mmol/L (ref 22–32)
Calcium: 8.3 mg/dL — ABNORMAL LOW (ref 8.9–10.3)
Chloride: 105 mmol/L (ref 98–111)
Creatinine, Ser: 0.84 mg/dL (ref 0.44–1.00)
GFR, Estimated: >60 mL/min (ref >60)
Glucose, Bld: 91 mg/dL (ref 70–99)
Potassium: 4.3 mmol/L (ref 3.5–5.1)
Sodium: 138 mmol/L (ref 135–145)
Total Bilirubin: 0.7 mg/dL (ref 0.3–1.2)
Total Protein: 5.6 g/dL — ABNORMAL LOW (ref 6.5–8.1)

## 2023-07-10 LAB — TROPONIN I (HIGH SENSITIVITY)
Troponin I (High Sensitivity): 7 ng/L (ref <18)
Troponin I (High Sensitivity): 6 ng/L (ref <18)

## 2023-07-10 LAB — CBC
HCT: 33.4 % — ABNORMAL LOW (ref 36.0–46.0)
Hemoglobin: 11 g/dL — ABNORMAL LOW (ref 12.0–15.0)
MCH: 37.3 pg — ABNORMAL HIGH (ref 26.0–34.0)
MCHC: 32.9 g/dL (ref 30.0–36.0)
MCV: 113.2 fL — ABNORMAL HIGH (ref 80.0–100.0)
Platelets: 132 10*3/uL — ABNORMAL LOW (ref 150–400)
RBC: 2.95 MIL/uL — ABNORMAL LOW (ref 3.87–5.11)
RDW: 14.9 % (ref 11.5–15.5)
WBC: 48.6 10*3/uL — ABNORMAL HIGH (ref 4.0–10.5)
nRBC: 0 % (ref 0.0–0.2)

## 2023-07-10 MED ORDER — IOHEXOL 350 MG/ML SOLN
75.0000 mL | Freq: Once | INTRAVENOUS | Status: AC | PRN
  Administered 2023-07-10: 75 mL via INTRAVENOUS

## 2023-07-10 MED ORDER — HEPARIN SOD (PORK) LOCK FLUSH 100 UNIT/ML IV SOLN
500.0000 [IU] | Freq: Once | INTRAVENOUS | Status: AC
  Administered 2023-07-10: 500 [IU] via INTRAVENOUS
  Filled 2023-07-10: qty 5

## 2023-07-10 NOTE — ED Triage Notes (Signed)
Pt came from home and has palpitations, weakness, and chest pain for about a week, but exceptionally stronger this morning. Pt has past radiation treatment and chemo treatment on Monday.

## 2023-07-10 NOTE — Discharge Instructions (Addendum)
Thank you for letting us evaluate you today.  Your heart enzymes are negative for acute cardiac syndrome.  Your CT is negative for PE or pneumonia.  Your lab work is not significant for electrolyte abnormality that is deviated from your baseline.  Please follow-up with your oncologist and primary care provider as scheduled.  Please go to your echo appointment scheduled.  Please make sure to rest as much as possible and limit stress  Please return to emergency department if you experience significant chest pain, shortness of breath/unable to take full breath/unable to speak in full and complete sentences, swelling of legs, fever with respiratory symptoms

## 2023-07-10 NOTE — ED Provider Notes (Signed)
[facility] EMERGENCY DEPARTMENT AT [facility] Provider Note   CSN: [id number] Arrival date & time: 07/10/23  1191     History  Chief Complaint  Patient presents with   Chest Pain    Denise [name] is a 72 y.o. female with past medical history of breast cancer that spread to bones and brain presents to emergency department for evaluation of intermittent palpitations that started on 06/24/2023.   She was told by her radiation provider that she may experience palpations following treatments. She has had a lot of stress with recent family visits and is currently teaching full-time.  She also reports shortness of breath in supine position over the past 2 weeks causing her to sleep in recliner. She has an echo scheduled on 07/22/23  She had her last radiation treatment on 06/29/23 but has chemotherapy treatments every 3 weeks.   Chest Pain Associated symptoms: shortness of breath (when supine)   Associated symptoms: no abdominal pain, no cough, no dizziness, no fatigue, no fever, no headache, no nausea, no numbness, no palpitations, no vomiting and no weakness        Home Medications Prior to Admission medications   Medication Sig Start Date End Date Taking? Authorizing Provider  Acetaminophen (TYLENOL PO) Take 1 tablet by mouth as needed.    [provider]  dexamethasone (DECADRON) 4 MG tablet Take 1 tablet (4 mg total) by mouth 2 (two) times daily with a meal. 06/21/23   Ronny Bacon, PA-C  gabapentin (NEURONTIN) 300 MG capsule Take 1 capsule by mouth 2 (two) times daily. 11/05/20   [provider]  hydrALAZINE (APRESOLINE) 25 MG tablet Take 25 mg by mouth 3 (three) times daily. 07/16/22   [provider]  Lactulose 20 GM/30ML SOLN Take 15 mLs (10 g total) by mouth at bedtime as needed. 04/12/23   [name], MD  levothyroxine (SYNTHROID) 75 MCG tablet Take 75 mcg by mouth daily before breakfast.    [provider]   lidocaine (XYLOCAINE) 2 % solution Use as directed 15 mLs in the mouth or throat as needed (reflux). 03/24/23   Burgess Amor, PA-C  Melatonin 5 MG TABS Take 10 mg by mouth. As needed for sleep    [provider]  metoCLOPramide (REGLAN) 10 MG tablet Take 1 tablet (10 mg total) by mouth every 6 (six) hours as needed for nausea or vomiting. 03/24/23   Idol, [name], PA-C  midodrine (PROAMATINE) 10 MG tablet Take 10 mg by mouth daily. 02/11/23   [provider]  olmesartan-hydrochlorothiazide (BENICAR HCT) 40-25 MG tablet Take 1 tablet by mouth daily. 07/16/22   [provider]  ondansetron (ZOFRAN) 4 MG tablet Take 1 tablet (4 mg total) by mouth every 8 (eight) hours as needed for nausea or vomiting. 07/05/23   [name], MD  pantoprazole (PROTONIX) 40 MG tablet Take 1 tablet (40 mg total) by mouth daily. 04/01/23   Ahmed, Juanetta Beets, MD  promethazine (PHENERGAN) 25 MG suppository Place 1 suppository (25 mg total) rectally every 6 (six) hours as needed for nausea or vomiting. 03/28/23   Bethann Berkshire, MD  sucralfate (CARAFATE) 1 g tablet Take 1 tablet (1 g total) by mouth every 4 (four) hours as needed. 03/30/23   [name], MD      Allergies    Exforge [amlodipine besylate-valsartan], Gadolinium derivatives, Gadopiclenol, Tape, Cheese, Levofloxacin in d5w, Other, Proanthocyanidin, Strawberry extract, Wild lettuce extract (lactuca virosa), and Yeast-derived drug products    Review  of Systems   Review of Systems  Constitutional:  Negative for chills, fatigue and fever.  Respiratory:  Positive for shortness of breath (when supine). Negative for cough, chest tightness and wheezing.   Cardiovascular:  Positive for chest pain. Negative for palpitations.  Gastrointestinal:  Negative for abdominal pain, constipation, diarrhea, nausea and vomiting.  Neurological:  Negative for dizziness, seizures, weakness, light-headedness, numbness and headaches.    Physical  Exam Updated Vital Signs BP 107/70 (BP Location: Right Arm)   Pulse 62   Temp 98.2 F (36.8 C) (Oral)   Resp 16   Ht 5\' 5"  (1.651 m)   Wt 73.8 kg   SpO2 98%   BMI 27.07 kg/m  Physical Exam Vitals and nursing note reviewed.  Constitutional:      General: She is not in acute distress.    Appearance: Normal appearance.  HENT:     Head: Normocephalic and atraumatic.  Eyes:     Conjunctiva/sclera: Conjunctivae normal.  Cardiovascular:     Rate and Rhythm: Normal rate.     Pulses: Normal pulses.     Heart sounds: Normal heart sounds. No murmur heard. Pulmonary:     Effort: Pulmonary effort is normal. No respiratory distress.     Breath sounds: Normal breath sounds.     Comments: No rales, wheezing, rhonchi in all lobes Speaks in full and complete sentences without difficulty Musculoskeletal:     Cervical back: Normal range of motion and neck supple.  Lymphadenopathy:     Cervical: No cervical adenopathy.  Skin:    General: Skin is warm.     Coloration: Skin is not jaundiced or pale.  Neurological:     Mental Status: She is alert. Mental status is at baseline.     ED Results / Procedures / Treatments   Labs (all labs ordered are listed, but only abnormal results are displayed) Labs Reviewed  CBC - Abnormal; Notable for the following components:      Result Value   WBC 48.6 (*)    RBC 2.95 (*)    Hemoglobin 11.0 (*)    HCT 33.4 (*)    MCV 113.2 (*)    MCH 37.3 (*)    Platelets 132 (*)    All other components within normal limits  COMPREHENSIVE METABOLIC PANEL - Abnormal; Notable for the following components:   BUN 35 (*)    Calcium 8.3 (*)    Total Protein 5.6 (*)    Albumin 3.0 (*)    AST 13 (*)    All other components within normal limits  TROPONIN I (HIGH SENSITIVITY)  TROPONIN I (HIGH SENSITIVITY)    EKG None  Radiology CT Angio Chest PE W and/or Wo Contrast  Result Date: 07/10/2023 CLINICAL DATA:  Pulmonary embolism (PE) suspected, high prob.  History of metastatic breast cancer * Tracking Code: BO * EXAM: CT ANGIOGRAPHY CHEST WITH CONTRAST TECHNIQUE: Multidetector CT imaging of the chest was performed using the standard protocol during bolus administration of intravenous contrast. Multiplanar CT image reconstructions and MIPs were obtained to evaluate the vascular anatomy. RADIATION DOSE REDUCTION: This exam was performed according to the departmental dose-optimization program which includes automated exposure control, adjustment of the mA and/or kV according to patient size and/or use of iterative reconstruction technique. CONTRAST:  75mL OMNIPAQUE IOHEXOL 350 MG/ML SOLN COMPARISON:  June 08, 2023, May 28, 2022, Feb 03, 2021 FINDINGS: Cardiovascular: Heart is mildly enlarged. No pericardial effusion. RIGHT chest port with tip terminating at the superior cavoatrial  junction. Adequate contrast opacification of the pulmonary arteries through the proximal subsegmental pulmonary arteries. No evidence of acute pulmonary embolism. Thoracic aorta is normal in course and caliber scattered atherosclerotic calcifications. Predominately LEFT-sided coronary artery atherosclerotic calcifications. Mediastinum/Nodes: Revisualization of multiple coarsely calcified mediastinal and hilar lymph nodes, consistent with sequela of remote granulomatous infection. Status post LEFT axillary node dissection. No new suspicious RIGHT axillary adenopathy. Lungs/Pleura: No pleural effusion or pneumothorax. Revisualization of multiple coarsely calcified pulmonary nodules consistent with sequela of prior granulomatous infection. LEFT lower lobe pulmonary nodule with central coarse calcification measures 11 mm and is unchanged compared to more remote priors, consistent with a benign etiology and sequela of remote prior granulomatous infection (series 5, image 113). Unchanged clustered nodularity of the LEFT lower lobe since more remote priors, consistent with sequela of prior  infection. There is increased bronchial wall thickening with faint interlobular septal thickening. Upper Abdomen: No acute abnormality. Revisualization of a likely epidermal inclusion cyst of the LEFT anterior abdominal wall. Musculoskeletal: Status post LEFT mastectomy. Similar appearance of mixed lytic and sclerotic lesions throughout the thoracic spine and several ribs consistent with known osseous metastatic disease. No new fracture deformities identified. Review of the MIP images confirms the above findings. IMPRESSION: 1. No evidence of acute pulmonary embolism. 2. There is increased bronchial wall thickening with faint interlobular septal thickening. This is nonspecific and could reflect bronchitis or mild pulmonary edema. 3. Similar appearance of osseous metastatic disease. Aortic Atherosclerosis (ICD10-I70.0). Electronically Signed   By: Meda Klinefelter M.D.   On: 07/10/2023 13:05   DG Chest 2 View  Result Date: 07/10/2023 CLINICAL DATA:  Palpitations EXAM: CHEST - 2 VIEW COMPARISON:  03/24/2023 FINDINGS: Port in the anterior chest wall with tip in distal SVC. Normal mediastinum and cardiac silhouette. Normal pulmonary vasculature. No evidence of effusion, infiltrate, or pneumothorax. No acute bony abnormality. LEFT axillary nodal dissection clips. IMPRESSION: 1. No acute cardiopulmonary process. 2. Port in place. Electronically Signed   By: Genevive Bi M.D.   On: 07/10/2023 11:22    Procedures Procedures    Medications Ordered in ED Medications  iohexol (OMNIPAQUE) 350 MG/ML injection 75 mL (75 mLs Intravenous Contrast Given 07/10/23 1204)  heparin lock flush 100 unit/mL (500 Units Intravenous Given 07/10/23 1518)    ED Course/ Medical Decision Making/ A&P                                 Medical Decision Making Amount and/or Complexity of Data Reviewed Labs: ordered. Radiology: ordered.  Risk Prescription drug management.   Patient presents to the ED for concern of  intermittent palpitations since 10/10, this involves an extensive number of treatment options, and is a complaint that carries with it a high risk of complications and morbidity.  The differential diagnosis includes ACS, PE, pneumonia, electrolyte abnormality. This is not an exhaustive list   Co morbidities that complicate the patient evaluation  Metastatic breast cancer   Additional history obtained:  Additional history obtained from Nursing, Outside Medical Records, and Past Admission   External records from outside source obtained and reviewed including most recent note from [facility] on 07/05/2023   Lab Tests:  I Ordered, and personally interpreted labs.  The pertinent results include:  leukocytosis of 48.6 She denies vomiting, diarrhea, respiratory symptoms, urinary symptoms   Imaging Studies ordered:  I ordered imaging studies including CXR, CT PE  I independently visualized and interpreted  imaging which showed no acute pneumonia, pulmonary edema, PE I agree with the radiologist interpretation   Cardiac Monitoring:  The patient was maintained on a cardiac monitor.  I personally viewed and interpreted the cardiac monitored which showed an underlying rhythm of: NSR with no STE nor ischemia    Problem List / ED Course:  Palpitations orthopnea   Reevaluation:  After the interventions noted above, I reevaluated the patient and found that they have :stayed the same    Dispostion:  Patient is resting comfortably in bed.  She does not appear in physical signs of distress.  Vital signs WNL.  See HPI.  Patient complains of intermittent palpitations and orthopnea while supine and sleeping at night.  PE significant for lung sounds CTAB.  No heart murmur auscultated.  No abdominal tenderness.  While in the room with patient, she reports that she felt palpitations.  No tachycardia, tachypnea, hypoxia noted while she reports palpitations.  She denies current  shortness of breath or chest pain.  Lab work was not significant for elevated troponin, EKG changes.  There was leukocytosis of 48.6 however patient reports that she had a shot that increased her white blood cells this week following radiation treatment and typically has white blood cell counts that vary.  She denies respiratory symptoms, urinary symptoms, vomiting, diarrhea or infectious symptoms.  Obtained CTPA to rule out D-dimer as patient is higher risk due to active cancer.  CT is negative for pneumonia and PE.  With negative workup, palpitations may be due to recent radiation treatment/chemo treatments or due to increased stress from working full-time and family members visiting this week.  After consideration of the diagnostic results and the patients response to treatment, I feel that the patent would benefit from outpatient management with scheduled echo on 07/22/2023 that was ordered by her oncologist due to her complaints of palpitation and orthopnea.  As patient is well-appearing with vital signs WNL and negative workup, I do not feel that ED admission is required at this time.  Discussed findings, imaging, disposition, return to emergency department precautions with patient expresses understanding agrees with plan.  Return to ED precautions include but not limited to chest pain, worsening shortness of breath, or dyspnea at rest, wheezing, inability to speak in full complete sentences, pedal edema, epigastric pain,  Discussed patient with Dr. Elayne Snare who agrees with plan       Final Clinical Impression(s) / ED Diagnoses Final diagnoses:  Palpitations  Orthopnea    Rx / DC Orders ED Discharge Orders     None         Judithann Sheen, PA 07/10/23 1850    Royanne Foots, DO 07/15/23 [phone]

## 2023-07-17 ENCOUNTER — Emergency Department (HOSPITAL_COMMUNITY)
Admission: EM | Admit: 2023-07-17 | Discharge: 2023-07-17 | Disposition: A | Discharge status: Home / Self Care | Payer: Medicare Other | Attending: Emergency Medicine | Admitting: Emergency Medicine

## 2023-07-17 ENCOUNTER — Other Ambulatory Visit: Payer: Self-pay

## 2023-07-17 ENCOUNTER — Emergency Department (HOSPITAL_COMMUNITY): Payer: Medicare Other

## 2023-07-17 ENCOUNTER — Encounter (HOSPITAL_COMMUNITY): Payer: Self-pay | Admitting: *Deleted

## 2023-07-17 DIAGNOSIS — T66XXXA Radiation sickness, unspecified, initial encounter: Secondary | ICD-10-CM | POA: Diagnosis not present

## 2023-07-17 DIAGNOSIS — Z853 Personal history of malignant neoplasm of breast: Secondary | ICD-10-CM | POA: Insufficient documentation

## 2023-07-17 DIAGNOSIS — J45909 Unspecified asthma, uncomplicated: Secondary | ICD-10-CM | POA: Insufficient documentation

## 2023-07-17 DIAGNOSIS — T66XXXD Radiation sickness, unspecified, subsequent encounter: Secondary | ICD-10-CM

## 2023-07-17 DIAGNOSIS — I10 Essential (primary) hypertension: Secondary | ICD-10-CM | POA: Insufficient documentation

## 2023-07-17 DIAGNOSIS — R0789 Other chest pain: Secondary | ICD-10-CM | POA: Diagnosis not present

## 2023-07-17 DIAGNOSIS — R079 Chest pain, unspecified: Secondary | ICD-10-CM | POA: Diagnosis not present

## 2023-07-17 LAB — CBC WITH DIFFERENTIAL/PLATELET
Abs Immature Granulocytes: 0.04 10*3/uL (ref 0.00–0.07)
Basophils Absolute: 0 10*3/uL (ref 0.0–0.1)
Basophils Relative: 0 %
Eosinophils Absolute: 0 10*3/uL (ref 0.0–0.5)
Eosinophils Relative: 0 %
HCT: 33.8 % — ABNORMAL LOW (ref 36.0–46.0)
Hemoglobin: 11 g/dL — ABNORMAL LOW (ref 12.0–15.0)
Immature Granulocytes: 1 %
Lymphocytes Relative: 7 %
Lymphs Abs: 0.4 10*3/uL — ABNORMAL LOW (ref 0.7–4.0)
MCH: 35.9 pg — ABNORMAL HIGH (ref 26.0–34.0)
MCHC: 32.5 g/dL (ref 30.0–36.0)
MCV: 110.5 fL — ABNORMAL HIGH (ref 80.0–100.0)
Monocytes Absolute: 0.2 10*3/uL (ref 0.1–1.0)
Monocytes Relative: 4 %
Neutro Abs: 4.5 10*3/uL (ref 1.7–7.7)
Neutrophils Relative %: 88 %
Platelets: 114 10*3/uL — ABNORMAL LOW (ref 150–400)
RBC: 3.06 MIL/uL — ABNORMAL LOW (ref 3.87–5.11)
RDW: 14.1 % (ref 11.5–15.5)
WBC: 5.1 10*3/uL (ref 4.0–10.5)
nRBC: 0 % (ref 0.0–0.2)

## 2023-07-17 LAB — COMPREHENSIVE METABOLIC PANEL
ALT: 80 U/L — ABNORMAL HIGH (ref 0–44)
AST: 29 U/L (ref 15–41)
Albumin: 3.4 g/dL — ABNORMAL LOW (ref 3.5–5.0)
Alkaline Phosphatase: 140 U/L — ABNORMAL HIGH (ref 38–126)
Anion gap: 9 (ref 5–15)
BUN: 26 mg/dL — ABNORMAL HIGH (ref 8–23)
CO2: 20 mmol/L — ABNORMAL LOW (ref 22–32)
Calcium: 8.5 mg/dL — ABNORMAL LOW (ref 8.9–10.3)
Chloride: 104 mmol/L (ref 98–111)
Creatinine, Ser: 0.92 mg/dL (ref 0.44–1.00)
GFR, Estimated: >60 mL/min (ref >60)
Glucose, Bld: 121 mg/dL — ABNORMAL HIGH (ref 70–99)
Potassium: 3.9 mmol/L (ref 3.5–5.1)
Sodium: 133 mmol/L — ABNORMAL LOW (ref 135–145)
Total Bilirubin: 0.5 mg/dL (ref 0.3–1.2)
Total Protein: 6.1 g/dL — ABNORMAL LOW (ref 6.5–8.1)

## 2023-07-17 LAB — TROPONIN I (HIGH SENSITIVITY): Troponin I (High Sensitivity): 4 ng/L (ref <18)

## 2023-07-17 MED ORDER — HEPARIN SOD (PORK) LOCK FLUSH 100 UNIT/ML IV SOLN
500.0000 [IU] | Freq: Once | INTRAVENOUS | Status: AC
  Administered 2023-07-17: 500 [IU]
  Filled 2023-07-17: qty 5

## 2023-07-17 MED ORDER — SODIUM CHLORIDE 0.9 % IV BOLUS
1000.0000 mL | Freq: Once | INTRAVENOUS | Status: AC
  Administered 2023-07-17: 1000 mL via INTRAVENOUS

## 2023-07-17 MED ORDER — SUCRALFATE 1 G PO TABS
1.0000 g | ORAL_TABLET | Freq: Once | ORAL | Status: AC
  Administered 2023-07-17: 1 g via ORAL
  Filled 2023-07-17: qty 1

## 2023-07-17 MED ORDER — LIDOCAINE 5 % EX PTCH
1.0000 | MEDICATED_PATCH | CUTANEOUS | Status: DC
  Administered 2023-07-17: 1 via TRANSDERMAL
  Filled 2023-07-17: qty 1

## 2023-07-17 MED ORDER — ONDANSETRON HCL 4 MG/2ML IJ SOLN
4.0000 mg | Freq: Once | INTRAMUSCULAR | Status: AC
Start: 2023-07-17 — End: 2023-07-17
  Administered 2023-07-17: 4 mg via INTRAVENOUS
  Filled 2023-07-17: qty 2

## 2023-07-17 MED ORDER — MORPHINE SULFATE (PF) 4 MG/ML IV SOLN: 4.0000 mg | Freq: Once | INTRAVENOUS | Status: DC

## 2023-07-17 NOTE — ED Provider Notes (Signed)
[facility] EMERGENCY DEPARTMENT AT [facility] Provider Note   CSN: [id number] Arrival date & time: 07/17/23  0900     History  Chief Complaint  Patient presents with   Shortness of Breath   Burning in Chest   Weakness   Nausea    Denise [name] is a 72 y.o. female with a history of metastatic breast cancer, hypertension, and asthma who presents the ED today for chest pain.  Patient states that she has had a burning sensation at the center of her chest with associated shortness of breath when she lays flat down.  She was seen at [facility] ED on 10/26 for the same complaint and states that the pain is persistent since then.  She was negative for ACS or pulmonary embolism at the time.  Denies any worsening of symptoms since then.  She has not taken anything for the pain. Additionally, patient reports that she feels generalized weakness with out any focal neuro deficits.  She also endorses nausea with some vomiting.  No fever, abdominal pain, vision changes, or slurred speech.  Denies any other concerns or complaints at this time.    Home Medications Prior to Admission medications   Medication Sig Start Date End Date Taking? Authorizing Provider  Acetaminophen (TYLENOL PO) Take 1 tablet by mouth as needed.    [provider]  dexamethasone (DECADRON) 4 MG tablet Take 1 tablet (4 mg total) by mouth 2 (two) times daily with a meal. 06/21/23   Ronny Bacon, PA-C  gabapentin (NEURONTIN) 300 MG capsule Take 1 capsule by mouth 2 (two) times daily. 11/05/20   [provider]  hydrALAZINE (APRESOLINE) 25 MG tablet Take 25 mg by mouth 3 (three) times daily. 07/16/22   [provider]  Lactulose 20 GM/30ML SOLN Take 15 mLs (10 g total) by mouth at bedtime as needed. 04/12/23   [name], MD  levothyroxine (SYNTHROID) 75 MCG tablet Take 75 mcg by mouth daily before breakfast.    [provider]  lidocaine (XYLOCAINE) 2 % solution  Use as directed 15 mLs in the mouth or throat as needed (reflux). 03/24/23   Burgess Amor, PA-C  Melatonin 5 MG TABS Take 10 mg by mouth. As needed for sleep    [provider]  metoCLOPramide (REGLAN) 10 MG tablet Take 1 tablet (10 mg total) by mouth every 6 (six) hours as needed for nausea or vomiting. 03/24/23   Idol, [name], PA-C  midodrine (PROAMATINE) 10 MG tablet Take 10 mg by mouth daily. 02/11/23   [provider]  olmesartan-hydrochlorothiazide (BENICAR HCT) 40-25 MG tablet Take 1 tablet by mouth daily. 07/16/22   [provider]  ondansetron (ZOFRAN) 4 MG tablet Take 1 tablet (4 mg total) by mouth every 8 (eight) hours as needed for nausea or vomiting. 07/05/23   [name], MD  pantoprazole (PROTONIX) 40 MG tablet Take 1 tablet (40 mg total) by mouth daily. 04/01/23   Ahmed, Juanetta Beets, MD  promethazine (PHENERGAN) 25 MG suppository Place 1 suppository (25 mg total) rectally every 6 (six) hours as needed for nausea or vomiting. 03/28/23   Bethann Berkshire, MD  sucralfate (CARAFATE) 1 g tablet Take 1 tablet (1 g total) by mouth every 4 (four) hours as needed. 03/30/23   [name], MD      Allergies    Exforge [amlodipine besylate-valsartan], Gadolinium derivatives, Gadopiclenol, Tape, Cheese, Levofloxacin in d5w, Other, Proanthocyanidin, Strawberry extract, Wild lettuce extract (lactuca virosa), and Yeast-derived drug  products    Review of Systems   Review of Systems  Respiratory:  Positive for shortness of breath.   Neurological:  Positive for weakness.  All other systems reviewed and are negative.   Physical Exam Updated Vital Signs BP 129/85   Pulse 64   Temp 98.3 F (36.8 C) (Oral)   Resp 15   Ht 5\' 5"  (1.651 m)   Wt 68 kg   SpO2 100%   BMI 24.96 kg/m  Physical Exam Vitals and nursing note reviewed.  Constitutional:      General: She is not in acute distress.    Appearance: Normal appearance.  HENT:     Head: Normocephalic and  atraumatic.     Mouth/Throat:     Mouth: Mucous membranes are moist.  Eyes:     Conjunctiva/sclera: Conjunctivae normal.     Pupils: Pupils are equal, round, and reactive to light.  Cardiovascular:     Rate and Rhythm: Normal rate and regular rhythm.     Pulses: Normal pulses.     Heart sounds: Normal heart sounds.  Pulmonary:     Effort: Pulmonary effort is normal.     Breath sounds: Normal breath sounds.  Abdominal:     Palpations: Abdomen is soft.     Tenderness: There is no abdominal tenderness.  Skin:    General: Skin is warm and dry.     Findings: No rash.  Neurological:     General: No focal deficit present.     Mental Status: She is alert.     Sensory: No sensory deficit.     Motor: No weakness.  Psychiatric:        Mood and Affect: Mood normal.        Behavior: Behavior normal.    ED Results / Procedures / Treatments   Labs (all labs ordered are listed, but only abnormal results are displayed) Labs Reviewed  COMPREHENSIVE METABOLIC PANEL - Abnormal; Notable for the following components:      Result Value   Sodium 133 (*)    CO2 20 (*)    Glucose, Bld 121 (*)    BUN 26 (*)    Calcium 8.5 (*)    Total Protein 6.1 (*)    Albumin 3.4 (*)    ALT 80 (*)    Alkaline Phosphatase 140 (*)    All other components within normal limits  CBC WITH DIFFERENTIAL/PLATELET - Abnormal; Notable for the following components:   RBC 3.06 (*)    Hemoglobin 11.0 (*)    HCT 33.8 (*)    MCV 110.5 (*)    MCH 35.9 (*)    Platelets 114 (*)    Lymphs Abs 0.4 (*)    All other components within normal limits  TROPONIN I (HIGH SENSITIVITY)    EKG EKG Interpretation Date/Time:  Saturday July 17 2023 10:09:46 EDT Ventricular Rate:  66 PR Interval:  140 QRS Duration:  93 QT Interval:  394 QTC Calculation: 413 R Axis:   -14  Text Interpretation: Sinus rhythm Abnormal R-wave progression, early transition Left ventricular hypertrophy No acute changes No significant change since  last tracing Confirmed by Derwood Kaplan [phone]) on 07/17/2023 11:09:50 AM  Radiology DG Chest 2 View  Result Date: 07/17/2023 CLINICAL DATA:  Chest pain EXAM: CHEST - 2 VIEW COMPARISON:  Chest x-ray dated July 10, 2023 FINDINGS: Cardiac and mediastinal contours are unchanged. Stable position of right chest wall port. Lungs are clear. Unchanged pulmonary nodules of the left hemithorax,  likely granulomas. No evidence of pleural effusion or pneumothorax. IMPRESSION: No active cardiopulmonary disease. Electronically Signed   By: Allegra Lai M.D.   On: 07/17/2023 10:51    Procedures Procedures: not indicated.   Medications Ordered in ED Medications  lidocaine (LIDODERM) 5 % 1 patch (1 patch Transdermal Patch Applied 07/17/23 1146)  ondansetron (ZOFRAN) injection 4 mg (4 mg Intravenous Given 07/17/23 1147)  sucralfate (CARAFATE) tablet 1 g (1 g Oral Given 07/17/23 1147)  sodium chloride 0.9 % bolus 1,000 mL (0 mLs Intravenous Stopped 07/17/23 1256)  heparin lock flush 100 unit/mL (500 Units Intracatheter Given 07/17/23 1252)    ED Course/ Medical Decision Making/ A&P                                 Medical Decision Making Amount and/or Complexity of Data Reviewed Labs: ordered. Radiology: ordered.  Risk Prescription drug management.   This patient presents to the ED for concern of burning sensation in chest, this involves an extensive number of treatment options, and is a complaint that carries with it a high risk of complications and morbidity.   Differential diagnosis includes: ACS, pneumonia, pneumothorax, pleurisy, costochondritis, side effect from radiation, etc.   Comorbidities  See HPI above   Additional History  Additional history obtained from previous ED note.   Cardiac Monitoring / EKG  The patient was maintained on a cardiac monitor.  I personally viewed and interpreted the cardiac monitored which showed: sinus rhythm with a heart rate of 66 bpm.   Lab  Tests  I ordered and personally interpreted labs.  The pertinent results include:   Sodium of 133 and BUN of 26 otherwise CMP and CBC within normal limits for patient Negative troponin   Imaging Studies  I ordered imaging studies including CXR  I independently visualized and interpreted imaging which showed: no active cardiopulmonary disease I agree with the radiologist interpretation   Problem List / ED Course / Critical Interventions / Medication Management  Burning sensation localized to the chest I ordered medications including: Carafate, Lidocaine patch, and NS for pain  Reevaluation of the patient after these medicines showed that the patient improved I have reviewed the patients home medicines and have made adjustments as needed   Social Determinants of Health  Access to healthcare   Test / Admission - Considered  Discussed findings with patient.  All questions were answered. She is hemodynamically stable and safe for discharge home. Return precautions provided.       Final Clinical Impression(s) / ED Diagnoses Final diagnoses:  Burning in the chest  Adverse effect of radiation, subsequent encounter    Rx / DC Orders ED Discharge Orders     None         Maxwell Marion, PA-C 07/17/23 1544    Derwood Kaplan, MD 07/18/23 [phone]

## 2023-07-17 NOTE — ED Triage Notes (Signed)
Pt presents with weakness, shob, nausea, decreased appetite, burning feeling in chest. Tries to eat but cannot due to nausea. Pt is a cancer pt. Completed radiation Oct 4. Auto Immune therapy every 3 weeks at [facility].

## 2023-07-17 NOTE — ED Notes (Signed)
Pt given warm blankets, pt has removed her dentures. They are labeled with her name in pink denture cup beside personal bag.

## 2023-07-17 NOTE — ED Provider Triage Note (Signed)
Emergency Medicine Provider Triage Evaluation Note  Denise [name] , a 72 y.o. female  was evaluated in triage.  Pt complains of chest pain.  Patient reports burning sensation in the center of the chest and shortness of breath with lying flat.  She was seen in any pain last week and they ruled out ACS or PE.  She states that her symptoms have persisted since.  She has not followed up with her oncologist since, but reports having an appointment with her oncologist in the next several weeks.  Review of Systems  Positive: Burning sensation in the center of the chest, shortness of breath when laying flat Negative: Fevers  Physical Exam  There were no vitals taken for this visit. Gen:   Awake, no distress   Resp:  Normal effort  MSK:   Moves extremities without difficulty  Other:    Medical Decision Making  Medically screening exam initiated at 9:11 AM.  Appropriate orders placed.  [name] was informed that the remainder of the evaluation will be completed by another provider, this initial triage assessment does not replace that evaluation, and the importance of remaining in the ED until their evaluation is complete.   Maxwell Marion, PA-C 07/17/23 [phone]

## 2023-07-17 NOTE — Discharge Instructions (Addendum)
As discussed, your troponin was within normal limits.  We did not repeat a CTA of your chest as your previous imaging did not show signs of pulmonary embolism and since your symptoms have not worsened, we do not think it is necessary to repeat that test.  Take Sucralfate (Carafate) as needed as well as the lidocaine patches for chest burning and discomfort.  Follow-up with your oncologist in the next 5 days for reevaluation of your symptoms.  Return to the ED if your symptoms worsen in the interim.

## 2023-07-19 ENCOUNTER — Other Ambulatory Visit: Payer: Self-pay | Admitting: Radiation Therapy

## 2023-07-20 ENCOUNTER — Other Ambulatory Visit: Payer: Self-pay

## 2023-07-22 ENCOUNTER — Ambulatory Visit (HOSPITAL_COMMUNITY)
Admission: RE | Admit: 2023-07-22 | Discharge: 2023-07-22 | Disposition: A | Discharge status: Home / Self Care | Payer: Medicare Other | Source: Ambulatory Visit | Attending: Hematology | Admitting: Hematology

## 2023-07-22 DIAGNOSIS — Z79899 Other long term (current) drug therapy: Secondary | ICD-10-CM | POA: Diagnosis not present

## 2023-07-22 DIAGNOSIS — C50912 Malignant neoplasm of unspecified site of left female breast: Secondary | ICD-10-CM | POA: Insufficient documentation

## 2023-07-22 DIAGNOSIS — I10 Essential (primary) hypertension: Secondary | ICD-10-CM | POA: Diagnosis not present

## 2023-07-22 DIAGNOSIS — I209 Angina pectoris, unspecified: Secondary | ICD-10-CM | POA: Diagnosis not present

## 2023-07-22 LAB — ECHOCARDIOGRAM COMPLETE
AR max vel: 2.58 cm2
AV Area VTI: 2.37 cm2
AV Area mean vel: 2.51 cm2
AV Mean grad: 5.1 mm[Hg]
AV Peak grad: 9.9 mm[Hg]
Ao pk vel: 1.57 m/s
Area-P 1/2: 2.6 cm2
Calc EF: 59.4 %
MV M vel: 5.13 m/s
MV Peak grad: 105.3 mm[Hg]
S' Lateral: 2.5 cm
Single Plane A2C EF: 52.9 %
Single Plane A4C EF: 63.1 %

## 2023-07-22 NOTE — Progress Notes (Signed)
  Echocardiogram 2D Echocardiogram has been performed.  Denise [name] 07/22/2023, 10:15 AM

## 2023-07-26 ENCOUNTER — Inpatient Hospital Stay: Payer: Medicare Other | Attending: Hematology

## 2023-07-26 ENCOUNTER — Inpatient Hospital Stay: Payer: Medicare Other

## 2023-07-26 ENCOUNTER — Encounter: Payer: Self-pay | Admitting: Hematology

## 2023-07-26 ENCOUNTER — Inpatient Hospital Stay (HOSPITAL_BASED_OUTPATIENT_CLINIC_OR_DEPARTMENT_OTHER): Payer: Medicare Other | Admitting: Hematology

## 2023-07-26 DIAGNOSIS — C50912 Malignant neoplasm of unspecified site of left female breast: Secondary | ICD-10-CM

## 2023-07-26 DIAGNOSIS — D702 Other drug-induced agranulocytosis: Secondary | ICD-10-CM

## 2023-07-26 DIAGNOSIS — C7951 Secondary malignant neoplasm of bone: Secondary | ICD-10-CM | POA: Insufficient documentation

## 2023-07-26 DIAGNOSIS — Z171 Estrogen receptor negative status [ER-]: Secondary | ICD-10-CM | POA: Insufficient documentation

## 2023-07-26 DIAGNOSIS — Z95828 Presence of other vascular implants and grafts: Secondary | ICD-10-CM | POA: Diagnosis not present

## 2023-07-26 DIAGNOSIS — Z79899 Other long term (current) drug therapy: Secondary | ICD-10-CM | POA: Insufficient documentation

## 2023-07-26 LAB — COMPREHENSIVE METABOLIC PANEL
ALT: 20 U/L (ref 0–44)
AST: 17 U/L (ref 15–41)
Albumin: 3.1 g/dL — ABNORMAL LOW (ref 3.5–5.0)
Alkaline Phosphatase: 101 U/L (ref 38–126)
Anion gap: 9 (ref 5–15)
BUN: 17 mg/dL (ref 8–23)
CO2: 23 mmol/L (ref 22–32)
Calcium: 8.2 mg/dL — ABNORMAL LOW (ref 8.9–10.3)
Chloride: 108 mmol/L (ref 98–111)
Creatinine, Ser: 0.78 mg/dL (ref 0.44–1.00)
GFR, Estimated: >60 mL/min (ref >60)
Glucose, Bld: 133 mg/dL — ABNORMAL HIGH (ref 70–99)
Potassium: 3.9 mmol/L (ref 3.5–5.1)
Sodium: 140 mmol/L (ref 135–145)
Total Bilirubin: 0.2 mg/dL (ref <1.2)
Total Protein: 6.1 g/dL — ABNORMAL LOW (ref 6.5–8.1)

## 2023-07-26 LAB — CBC WITH DIFFERENTIAL/PLATELET
Abs Immature Granulocytes: 0.09 10*3/uL — ABNORMAL HIGH (ref 0.00–0.07)
Basophils Absolute: 0 10*3/uL (ref 0.0–0.1)
Basophils Relative: 0 %
Eosinophils Absolute: 0.1 10*3/uL (ref 0.0–0.5)
Eosinophils Relative: 1 %
HCT: 32.8 % — ABNORMAL LOW (ref 36.0–46.0)
Hemoglobin: 10.7 g/dL — ABNORMAL LOW (ref 12.0–15.0)
Immature Granulocytes: 2 %
Lymphocytes Relative: 10 %
Lymphs Abs: 0.5 10*3/uL — ABNORMAL LOW (ref 0.7–4.0)
MCH: 36.6 pg — ABNORMAL HIGH (ref 26.0–34.0)
MCHC: 32.6 g/dL (ref 30.0–36.0)
MCV: 112.3 fL — ABNORMAL HIGH (ref 80.0–100.0)
Monocytes Absolute: 0.2 10*3/uL (ref 0.1–1.0)
Monocytes Relative: 4 %
Neutro Abs: 4.4 10*3/uL (ref 1.7–7.7)
Neutrophils Relative %: 83 %
Platelets: 174 10*3/uL (ref 150–400)
RBC: 2.92 MIL/uL — ABNORMAL LOW (ref 3.87–5.11)
RDW: 14.8 % (ref 11.5–15.5)
WBC: 5.3 10*3/uL (ref 4.0–10.5)
nRBC: 0 % (ref 0.0–0.2)

## 2023-07-26 LAB — MAGNESIUM: Magnesium: 1.8 mg/dL (ref 1.7–2.4)

## 2023-07-26 MED ORDER — HEPARIN SOD (PORK) LOCK FLUSH 100 UNIT/ML IV SOLN
500.0000 [IU] | Freq: Once | INTRAVENOUS | Status: AC | PRN
  Administered 2023-07-26: 500 [IU]

## 2023-07-26 MED ORDER — SODIUM CHLORIDE 0.9% FLUSH
10.0000 mL | Freq: Once | INTRAVENOUS | Status: AC
  Administered 2023-07-26: 10 mL via INTRAVENOUS

## 2023-07-26 MED ORDER — SERTRALINE HCL 25 MG PO TABS: 25.0000 mg | ORAL_TABLET | Freq: Every day | ORAL | 3 refills | Status: DC

## 2023-07-26 MED ORDER — SODIUM CHLORIDE 0.9% FLUSH
10.0000 mL | Freq: Once | INTRAVENOUS | Status: AC | PRN
  Administered 2023-07-26: 10 mL

## 2023-07-26 MED ORDER — SODIUM CHLORIDE 0.9 % IV SOLN
INTRAVENOUS | Status: DC
Start: 2023-07-26 — End: 2023-07-26

## 2023-07-26 MED ORDER — ZOLEDRONIC ACID 4 MG/100ML IV SOLN
4.0000 mg | Freq: Once | INTRAVENOUS | Status: AC
Start: 2023-07-26 — End: 2023-07-26
  Administered 2023-07-26: 4 mg via INTRAVENOUS
  Filled 2023-07-26: qty 100

## 2023-07-26 NOTE — Patient Instructions (Addendum)
[facility] at [facility] Discharge Instructions   You were seen and examined today by Dr. Ellin Saba.  He reviewed the results of your lab work which are normal/stable.   He reviewed the results of the xray of your right hip. There are three areas of concern that put you at risk for fracturing this. We will refer you to an orthopedic doctor to address this. In the meantime, do not lift anything that weighs more than 10 lbs. We will also start you on an infusion to help strengthen your bones. This infusion is given once a month. It can cause the calcium and the blood to be low. You should be on a calcium supplement (1200 mg) daily.   We will also refer you to a cardiologist for your palpitations. We also sent a prescription to your pharmacy for Zoloft 25 mg daily. You may pick up and start taking.   We will proceed with your treatment today.   Return as scheduled.    Thank you for choosing [facility] at [facility] to provide your oncology and hematology care.  To afford each patient quality time with our provider, please arrive at least 15 minutes before your scheduled appointment time.   If you have a lab appointment with the Cancer Center please come in thru the Main Entrance and check in at the main information desk.  You need to re-schedule your appointment should you arrive 10 or more minutes late.  We strive to give you quality time with our providers, and arriving late affects you and other patients whose appointments are after yours.  Also, if you no show three or more times for appointments you may be dismissed from the clinic at the providers discretion.     Again, thank you for choosing [facility].  Our hope is that these requests will decrease the amount of time that you wait before being seen by our physicians.       _____________________________________________________________  Should you have questions after your  visit to [facility], please contact our office at [phone] and follow the prompts.  Our office hours are 8:00 a.m. and 4:30 p.m. Monday - Friday.  Please note that voicemails left after 4:00 p.m. may not be returned until the following business day.  We are closed weekends and major holidays.  You do have access to a nurse 24-7, just call the main number to the clinic [phone] and do not press any options, hold on the line and a nurse will answer the phone.    For prescription refill requests, have your pharmacy contact our office and allow 72 hours.    Due to Covid, you will need to wear a mask upon entering the hospital. If you do not have a mask, a mask will be given to you at the Main Entrance upon arrival. For doctor visits, patients may have 1 support person age 81 or older with them. For treatment visits, patients can not have anyone with them due to social distancing guidelines and our immunocompromised population.

## 2023-07-26 NOTE — Progress Notes (Signed)
[facility] [address] [place] [address]    Clinic Day:  07/27/2023  Referring physician: Toma Deiters, MD  Patient Care Team: Toma Deiters, MD as PCP - General (Internal Medicine) [name], MD as Medical Oncologist (Medical Oncology)   ASSESSMENT & PLAN:   Assessment: 1.  T3N3c (stage IIIc) triple negative invasive lobular carcinoma of the left breast: - She felt lump in her left breast for more than 6 months, but thought it was secondary to fibrocystic disease which she had all her life.  When she started having pain, she reached out to Dr. Olena Leatherwood. - Ultrasound-guided left breast and left axillary lymph node biopsy on 01/15/2021 - Pathology consistent with invasive lobular carcinoma, E-cadherin negative.  ER/PR/HER2 negative.  HER2 2+ by IHC, negative by FISH.  Ki-67 is 5%.  Lymph node core biopsy was consistent with metastatic carcinoma.  Grade 2. - PET scan on 02/03/2021 showed involvement of left supraclavicular, subpectoral, axillary lymph nodes along with the breast mass.  10 mm left lung nodule which is hypometabolic.  Spinal cord lesion at T12 level with a strong uptake. - MRI of the lumbar spine with and without contrast on 02/20/2021 showed no mass or abnormal enhancement within the canal at the T12 level to correspond to the site of PET scan positive.  No marrow replacing bone lesion. - 12 weeks of carboplatin and paclitaxel weekly and every 3 weeks pembrolizumab followed by 4 cycles of dose dense AC with pembrolizumab ([id number]) from 02/27/2021 through 10/01/2021 - PET scan on 10/16/2021: Reduction in metabolic activity in the size of the left axillary lymph node and no evidence of breast hypermetabolism on PET scan. - Left mastectomy and lymph node excision by Dr. Magnus Ivan on 11/20/2021 - Pathology: 6.2 cm invasive lobular carcinoma, grade 2, margins negative.  19/21 lymph nodes involved with macrometastasis.  ypT3, YPN3A - Adjuvant  pembrolizumab started on 01/01/2022. -CREATE-X capecitabine 1500 mg twice daily 2 weeks on/1 week off started on 01/01/2022.  Last dose of pembrolizumab on 05/11/2022 - Germline mutation testing ([facility]) negative - [employer] (06/04/2022): T p53, K-ras V14 I, CDKN2A.  MSI-high not detected. - PET scan (05/28/2022): Multifocal bone lesions involving T11 vertebral body, right posterior sixth rib, left femoral neck, left scapular glenoid.  No other metastatic disease. - Sacituzumab cycle 1 on 08/18/2022.  She was hospitalized with neutropenic fever and severe diarrhea from 08/24/2022 through 08/27/2022. - CTAP (10/25/2021): Generalized mild to moderate wall thickening throughout the nondistended large bowel with faint pericolonic fat haziness compatible with nonspecific infectious/inflammatory colitis.  No free air.  C. difficile x 2 was negative. - She has UG T1 A1*28 heterozygosity. - Cycle 2 Sacituzumab dose reduced by 50%.  Discontinued secondary to poor tolerance. - Enhertu 3.2 Mg/KG cycle 1 on 11/04/2022 - WBRT 30 Gray in 10 fractions from 06/15/2023 through 06/29/2023   2.  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  She is non-smoker. - Mother died of breast cancer.  Maternal grandmother died very young in her 53s, sister has fibrocystic disease.  Father died of lung cancer and was a smoker.    Plan: 1.  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  Slightly increased lytic lesion of the right iliac crest with no evidence of visceral metastatic disease. - I have reviewed x-ray of  her femur which showed at least 3 lytic lesions. - Reviewed echo from 07/22/2023: LVEF 55-60%. - She reports palpitations when lying down at nighttime.  She had 2 ER visits on 07/10/2023 and 07/17/2023 for the same reason.  They have given her lidocaine patch  which helped after workup did not reveal any major problems. - Will make referral to cardiology for further evaluation. - She thinks it could be from panic attacks.  We will give a trial of Zoloft 25 mg daily. - We will also refer to orthopedics for prophylactic nailing of right femur to prevent future fractures.  I have also recommended not to lift weights more than 10 pounds. - We will check tumor markers today.  She may proceed with Enhertu today at 4.52 mg/kg.  2.  Brain lesions (SRS to 3 brain lesions on 06/16/2022): - WBRT completed on 06/29/2023.  Dexamethasone is being tapered off.  3.  Peripheral neuropathy: - Continue gabapentin 300 mg at bedtime.  4.  Hypomagnesemia: - Continue magnesium 1 tablet daily.  Magnesium is 1.8.  5.  Bone metastasis: - We talked about initiating her on zoledronic acid to prevent skeletal related events.  We discussed side effects including hypocalcemia, rare chance of osteonecrosis of the jaw.  We will schedule it every 6 weeks to align with her chemotherapy.    Orders Placed This Encounter  Procedures   Magnesium    Standing Status:   Future    Standing Expiration Date:   08/15/2024   CBC with Differential    Standing Status:   Future    Standing Expiration Date:   08/16/2024   Comprehensive metabolic panel    Standing Status:   Future    Standing Expiration Date:   08/16/2024   Magnesium    Standing Status:   Future    Standing Expiration Date:   09/05/2024   CBC with Differential    Standing Status:   Future    Standing Expiration Date:   09/06/2024   Comprehensive metabolic panel    Standing Status:   Future    Standing Expiration Date:   09/06/2024      I,Katie Daubenspeck,acting as a scribe for [name], MD.,have documented all relevant documentation on the behalf of [name], MD,as directed by  [name], MD while in the presence of [name], MD.   I, [name] MD, have reviewed  the above documentation for accuracy and completeness, and I agree with the above.   [name], MD   11/12/20249:11 AM  CHIEF COMPLAINT:   Diagnosis: Metastatic triple negative breast cancer to the bones    Cancer Staging  Invasive lobular carcinoma of left breast in female [facility]) Staging form: Breast, AJCC 8th Edition - Clinical stage from 01/23/2021: cT3, cN3c, G2, ER-, PR-, HER2- - Unsigned - Pathologic stage from 12/22/2021: No Stage Recommended (ypT3, pN3a, cM0, G2, ER-, PR-, HER2-) - Signed by [name], MD on 12/22/2021    Prior Therapy: 1.  Neoadjuvant chemotherapy with weekly carboplatin and paclitaxel and dose dense AC with pembrolizumab 2. Left mastectomy and lymph node excision by Dr. Magnus Ivan on 11/20/2021 3.  Sacituzumab on 08/18/2022 and 10/08/2021, discontinued due to poor tolerance.  Current Therapy:  Enhertu every 21 days    HISTORY OF PRESENT ILLNESS:   Oncology History  Invasive lobular carcinoma of left breast in female [facility])  01/23/2021 Initial Diagnosis   Invasive lobular carcinoma of left breast in female [facility])   02/19/2021 Genetic Testing  Negative genetic testing on the CancerNext-Expanded+RNAinsight panel.  SMARCB1 VUS identified.  The CancerNext-Expanded gene panel offered by [facility] and includes sequencing and rearrangement analysis for the following 77 genes: AIP, ALK, APC*, ATM*, AXIN2, BAP1, BARD1, BLM, BMPR1A, BRCA1*, BRCA2*, BRIP1*, CDC73, CDH1*, CDK4, CDKN1B, CDKN2A, CHEK2*, CTNNA1, DICER1, FANCC, FH, FLCN, GALNT12, KIF1B, LZTR1, MAX, MEN1, MET, MLH1*, MSH2*, MSH3, MSH6*, MUTYH*, NBN, NF1*, NF2, NTHL1, PALB2*, PHOX2B, PMS2*, POT1, PRKAR1A, PTCH1, PTEN*, RAD51C*, RAD51D*, RB1, RECQL, RET, SDHA, SDHAF2, SDHB, SDHC, SDHD, SMAD4, SMARCA4, SMARCB1, SMARCE1, STK11, SUFU, TMEM127, TP53*, TSC1, TSC2, VHL and XRCC2 (sequencing and deletion/duplication); EGFR, EGLN1, HOXB13, KIT, MITF, PDGFRA, POLD1, and POLE (sequencing only); EPCAM and  GREM1 (deletion/duplication only). DNA and RNA analyses performed for * genes. The report date is February 19, 2021.   02/27/2021 - 05/11/2022 Chemotherapy   Patient is on Treatment Plan : BREAST Pembrolizumab + Carboplatin D1,8,15+ Paclitaxel D1,8,15 q21d X 4 cycles / Pembrolizumab + AC q21d x 4 cycles     12/22/2021 Cancer Staging   Staging form: Breast, AJCC 8th Edition - Pathologic stage from 12/22/2021: No Stage Recommended (ypT3, pN3a, cM0, G2, ER-, PR-, HER2-) - Signed by [name], MD on 12/22/2021 Histopathologic type: Lobular carcinoma, NOS Stage prefix: Post-therapy Method of lymph node assessment: Axillary lymph node dissection Multigene prognostic tests performed: None Histologic grading system: 3 grade system   08/18/2022 - 10/08/2022 Chemotherapy   Patient is on Treatment Plan : BREAST METASTATIC Sacituzumab govitecan-hziy Drinda Butts) D1,8 q21d     11/04/2022 -  Chemotherapy   Patient is on Treatment Plan : BREAST METASTATIC Fam-Trastuzumab Deruxtecan-nxki (Enhertu) (5.4) q21d        INTERVAL HISTORY:   Cashae is a 72 y.o. female presenting to clinic today for follow up of Metastatic triple negative breast cancer to the bones. She was last seen by me on 07/05/23.  Today, she states that she is doing well overall. Her appetite level is at 100%. Her energy level is at 50%.  PAST MEDICAL HISTORY:   Past Medical History: Past Medical History:  Diagnosis Date   Asthma    as child   Cancer (HCC) 12/2020   left breast IMC   Complication of anesthesia    pateitn states,' I coded when I had my D&Cmany years ago.   Dyspnea    Family history of breast cancer    Hypertension    Hypothyroidism    PONV (postoperative nausea and vomiting)    Port-A-Cath in place 02/26/2021    Surgical History: Past Surgical History:  Procedure Laterality Date   ABDOMINAL HYSTERECTOMY     BIOPSY  01/17/2018   Procedure: BIOPSY;  Surgeon: Malissa Hippo, MD;  [facility];   Service: Endoscopy;;  duodenum,gastric   CHOLECYSTECTOMY     COLONOSCOPY WITH PROPOFOL N/A 01/17/2018   Procedure: COLONOSCOPY WITH PROPOFOL;  Surgeon: Malissa Hippo, MD;  [facility];  Service: Endoscopy;  Laterality: N/A;  7:30   DILATION AND CURETTAGE OF UTERUS     ESOPHAGOGASTRODUODENOSCOPY (EGD) WITH PROPOFOL N/A 01/17/2018   Procedure: ESOPHAGOGASTRODUODENOSCOPY (EGD) WITH PROPOFOL;  Surgeon: Malissa Hippo, MD;  [facility];  Service: Endoscopy;  Laterality: N/A;   POLYPECTOMY  01/17/2018   Procedure: POLYPECTOMY;  Surgeon: Malissa Hippo, MD;  [facility];  Service: Endoscopy;;  transverse colon, cecal   PORTACATH PLACEMENT Right 02/17/2021   Procedure: INSERTION PORT-A-CATH;  Surgeon: Abigail Miyamoto, MD;  Location: [facility];  Service: General;  Laterality: Right;   RADIOACTIVE SEED GUIDED AXILLARY SENTINEL LYMPH NODE Left 11/20/2021   Procedure: RADIOACTIVE SEED GUIDED LEFT AXILLARY SENTINEL LYMPH NODE DISSECTION;  Surgeon: Abigail Miyamoto, MD;  Location: [facility];  Service: General;  Laterality: Left;   TOTAL MASTECTOMY Left 11/20/2021   Procedure: LEFT TOTAL MASTECTOMY;  Surgeon: Abigail Miyamoto, MD;  Location: [facility];  Service: General;  Laterality: Left;    Social History: Social History   Socioeconomic History   Marital status: Widowed    Spouse name: Not on file   Number of children: 3   Years of education: Not on file   Highest education level: Not on file  Occupational History   Occupation: [employer]   Occupation: retired  Tobacco Use   Smoking status: Never   Smokeless tobacco: Never  Vaping Use   Vaping status: Never Used  Substance and Sexual Activity   Alcohol use: Never   Drug use: Never   Sexual activity: Not Currently    Birth control/protection: Surgical  Other Topics Concern   Not on file  Social History Narrative   Not on file   Social  Determinants of Health   Financial Resource Strain: Low Risk  (07/10/2022)   Overall Financial Resource Strain (CARDIA)    Difficulty of Paying Living Expenses: Not hard at all  Food Insecurity: No Food Insecurity (06/30/2022)   Hunger Vital Sign    Worried About Running Out of Food in the Last Year: Never true    Ran Out of Food in the Last Year: Never true  Transportation Needs: No Transportation Needs (07/10/2022)   PRAPARE - Administrator, Civil Service (Medical): No    Lack of Transportation (Non-Medical): No  Physical Activity: Insufficiently Active (01/22/2021)   Exercise Vital Sign    Days of Exercise per Week: 3 days    Minutes of Exercise per Session: 30 min  Stress: No Stress Concern Present (01/22/2021)   [employer] of Occupational Health - Occupational Stress Questionnaire    Feeling of Stress : Not at all  Social Connections: Moderately Integrated (01/22/2021)   Social Connection and Isolation Panel [NHANES]    Frequency of Communication with Friends and Family: More than three times a week    Frequency of Social Gatherings with Friends and Family: More than three times a week    Attends Religious Services: More than 4 times per year    Active Member of [employer] or Organizations: No    Attends Engineer, structural: More than 4 times per year    Marital Status: Widowed  Intimate Partner Violence: Not At Risk (05/28/2022)   Humiliation, Afraid, Rape, and Kick questionnaire    Fear of Current or Ex-Partner: No    Emotionally Abused: No    Physically Abused: No    Sexually Abused: No    Family History: Family History  Problem Relation Age of Onset   Breast cancer Mother        dx in her 29s   Heart disease Mother    Thyroid disease Mother    Lung cancer Father        dx in his 74s   Thyroid disease Maternal Aunt    Thyroid nodules Maternal Grandmother 35       goiter   Thyroid disease Maternal Grandmother    Heart disease Maternal  Grandfather    Heart disease Paternal Grandmother    Heart disease Paternal Grandfather  Thyroid disease Daughter    Thyroid disease Daughter    Cancer Maternal Uncle        NOS   Cancer Paternal Uncle        NOS   Breast cancer Cousin        pat first cousin died in her 13s;     Current Medications:  Current Outpatient Medications:    Acetaminophen (TYLENOL PO), Take 1 tablet by mouth as needed., Disp: , Rfl:    dexamethasone (DECADRON) 4 MG tablet, Take 1 tablet (4 mg total) by mouth 2 (two) times daily with a meal., Disp: 60 tablet, Rfl: 1   gabapentin (NEURONTIN) 300 MG capsule, Take 1 capsule by mouth 2 (two) times daily., Disp: , Rfl:    hydrALAZINE (APRESOLINE) 25 MG tablet, Take 25 mg by mouth 3 (three) times daily., Disp: , Rfl:    Lactulose 20 GM/30ML SOLN, Take 15 mLs (10 g total) by mouth at bedtime as needed., Disp: 450 mL, Rfl: 0   levothyroxine (SYNTHROID) 75 MCG tablet, Take 75 mcg by mouth daily before breakfast., Disp: , Rfl:    lidocaine (XYLOCAINE) 2 % solution, Use as directed 15 mLs in the mouth or throat as needed (reflux)., Disp: 100 mL, Rfl: 2   Melatonin 5 MG TABS, Take 10 mg by mouth. As needed for sleep, Disp: , Rfl:    metoCLOPramide (REGLAN) 10 MG tablet, Take 1 tablet (10 mg total) by mouth every 6 (six) hours as needed for nausea or vomiting., Disp: 30 tablet, Rfl: 0   midodrine (PROAMATINE) 10 MG tablet, Take 10 mg by mouth daily., Disp: , Rfl:    olmesartan-hydrochlorothiazide (BENICAR HCT) 40-25 MG tablet, Take 1 tablet by mouth daily., Disp: , Rfl:    ondansetron (ZOFRAN) 4 MG tablet, Take 1 tablet (4 mg total) by mouth every 8 (eight) hours as needed for nausea or vomiting., Disp: 30 tablet, Rfl: 3   pantoprazole (PROTONIX) 40 MG tablet, Take 1 tablet (40 mg total) by mouth daily., Disp: 60 tablet, Rfl: 3   promethazine (PHENERGAN) 25 MG suppository, Place 1 suppository (25 mg total) rectally every 6 (six) hours as needed for nausea or vomiting.,  Disp: 12 each, Rfl: 0   sertraline (ZOLOFT) 25 MG tablet, Take 1 tablet (25 mg total) by mouth daily., Disp: 90 tablet, Rfl: 3   sucralfate (CARAFATE) 1 g tablet, Take 1 tablet (1 g total) by mouth every 4 (four) hours as needed., Disp: 180 tablet, Rfl: 0   Allergies: Allergies  Allergen Reactions   Exforge [Amlodipine Besylate-Valsartan] Nausea And Vomiting   Gadolinium Derivatives Anaphylaxis, Hives and Itching    Pt should not have MRI contrast in outpt facility. Pt developed hives and itching AFTER taking 13 hr prep. Per Dr. Charise Killian. 04/23/23   Gadopiclenol Hives and Itching    13 hr prep prior to contrast admin    Tape Other (See Comments)    Redness and Irriatation   Cheese     Hard cheese   Levofloxacin In D5w Hives   Other    Proanthocyanidin    Strawberry Extract Diarrhea    Seeds,nuts, lettuce, grapes   Wild Lettuce Extract (Lactuca Virosa)    Yeast-Derived Drug Products Hives    Mold on bread    REVIEW OF SYSTEMS:   Review of Systems  Constitutional:  Positive for fatigue. Negative for chills and fever.  HENT:   Negative for lump/mass, mouth sores, nosebleeds, sore throat and trouble swallowing.   Eyes:  Negative for eye problems.  Respiratory:  Positive for shortness of breath. Negative for cough.   Cardiovascular:  Positive for palpitations. Negative for chest pain and leg swelling.  Gastrointestinal:  Positive for nausea and vomiting. Negative for abdominal pain, constipation and diarrhea.  Genitourinary:  Negative for bladder incontinence, difficulty urinating, dysuria, frequency, hematuria and nocturia.   Musculoskeletal:  Negative for arthralgias, back pain, flank pain, myalgias and neck pain.  Skin:  Negative for itching and rash.  Neurological:  Positive for dizziness and headaches. Negative for numbness.  Hematological:  Does not bruise/bleed easily.  Psychiatric/Behavioral:  Positive for sleep disturbance. Negative for depression and suicidal ideas. The  patient is nervous/anxious.   All other systems reviewed and are negative.    VITALS:   There were no vitals taken for this visit.  Wt Readings from Last 3 Encounters:  07/26/23 155 lb 12.8 oz (70.7 kg)  07/17/23 150 lb (68 kg)  07/10/23 162 lb 11.2 oz (73.8 kg)    There is no height or weight on file to calculate BMI.  Performance status (ECOG): 1 - Symptomatic but completely ambulatory  PHYSICAL EXAM:   Physical Exam Vitals and nursing note reviewed. Exam conducted with a chaperone present.  Constitutional:      Appearance: Normal appearance.  Cardiovascular:     Rate and Rhythm: Normal rate and regular rhythm.     Pulses: Normal pulses.     Heart sounds: Normal heart sounds.  Pulmonary:     Effort: Pulmonary effort is normal.     Breath sounds: Normal breath sounds.  Abdominal:     Palpations: Abdomen is soft. There is no hepatomegaly, splenomegaly or mass.     Tenderness: There is no abdominal tenderness.  Musculoskeletal:     Right lower leg: No edema.     Left lower leg: No edema.  Lymphadenopathy:     Cervical: No cervical adenopathy.     Right cervical: No superficial, deep or posterior cervical adenopathy.    Left cervical: No superficial, deep or posterior cervical adenopathy.     Upper Body:     Right upper body: No supraclavicular or axillary adenopathy.     Left upper body: No supraclavicular or axillary adenopathy.  Neurological:     General: No focal deficit present.     Mental Status: She is alert and oriented to person, place, and time.  Psychiatric:        Mood and Affect: Mood normal.        Behavior: Behavior normal.     LABS:      Latest Ref Rng & Units 07/26/2023    9:31 AM 07/17/2023    9:49 AM 07/10/2023   10:26 AM  CBC  WBC 4.0 - 10.5 K/uL 5.3  5.1  48.6   Hemoglobin 12.0 - 15.0 g/dL 11.9  14.7  82.9   Hematocrit 36.0 - 46.0 % 32.8  33.8  33.4   Platelets 150 - 400 K/uL 174  114  132       Latest Ref Rng & Units 07/26/2023     9:31 AM 07/17/2023    9:49 AM 07/10/2023   10:26 AM  CMP  Glucose 70 - 99 mg/dL 562  130  91   BUN 8 - 23 mg/dL 17  26  35   Creatinine 0.44 - 1.00 mg/dL 8.65  7.84  6.96   Sodium 135 - 145 mmol/L 140  133  138   Potassium 3.5 - 5.1 mmol/L  3.9  3.9  4.3   Chloride 98 - 111 mmol/L 108  104  105   CO2 22 - 32 mmol/L 23  20  23    Calcium 8.9 - 10.3 mg/dL 8.2  8.5  8.3   Total Protein 6.5 - 8.1 g/dL 6.1  6.1  5.6   Total Bilirubin <1.2 mg/dL 0.2  0.5  0.7   Alkaline Phos 38 - 126 U/L 101  140  115   AST 15 - 41 U/L 17  29  13    ALT 0 - 44 U/L 20  80  18      No results found for: "CEA1", "CEA" / No results found for: "CEA1", "CEA" No results found for: "PSA1" No results found for: "WUJ811" No results found for: "CAN125"  No results found for: "TOTALPROTELP", "ALBUMINELP", "A1GS", "A2GS", "BETS", "BETA2SER", "GAMS", "MSPIKE", "SPEI" No results found for: "TIBC", "FERRITIN", "IRONPCTSAT" No results found for: "LDH"   STUDIES:   ECHOCARDIOGRAM COMPLETE  Result Date: 07/22/2023    ECHOCARDIOGRAM REPORT   Patient Name:   Anetha A Vandenboom Date of Exam: 07/22/2023 Medical Rec #:  [id number]         Height:       65.0 in Accession #:    [id number]        Weight:       150.0 lb Date of Birth:  1951/08/07         BSA:          1.750 m Patient Age:    72 years          BP:           113/77 mmHg Patient Gender: F                 HR:           72 bpm. [facility] Procedure: 2D Echo, 3D Echo, Color Doppler, Cardiac Doppler and Strain Analysis Indications:    Chemo  History:        Patient has prior history of Echocardiogram examinations, most                 recent 10/30/2022. Signs/Symptoms:Shortness of Breath; Risk                 Factors:Hypertension. Hypothyroidism, breast CA s/p radiation.  Sonographer:    Milda Smart Referring Phys: [id number]  Sonographer Comments: Image acquisition challenging due to respiratory motion and Image acquisition challenging due to  mastectomy. Global longitudinal strain was attempted. IMPRESSIONS  1. Left ventricular ejection fraction, by estimation, is 55 to 60%. The left ventricle has normal function. The left ventricle has no regional wall motion abnormalities. There is mild left ventricular hypertrophy. Left ventricular diastolic parameters are consistent with Grade I diastolic dysfunction (impaired relaxation). The average left ventricular global longitudinal strain is -16.2 %. The global longitudinal strain is normal.  2. Right ventricular systolic function is normal. The right ventricular size is normal. Tricuspid regurgitation signal is inadequate for assessing PA pressure.  3. The mitral valve is normal in structure. Mild mitral valve regurgitation. No evidence of mitral stenosis.  4. The tricuspid valve is abnormal.  5. The aortic valve was not well visualized. Aortic valve regurgitation is not visualized. No aortic stenosis is present.  6. There is mild dilatation of the ascending aorta, measuring 37 mm.  7. The inferior vena cava is normal in size with greater than 50% respiratory variability, suggesting right  atrial pressure of 3 mmHg. FINDINGS  Left Ventricle: Left ventricular ejection fraction, by estimation, is 55 to 60%. The left ventricle has normal function. The left ventricle has no regional wall motion abnormalities. The average left ventricular global longitudinal strain is -16.2 %. The global longitudinal strain is normal. The left ventricular internal cavity size was normal in size. There is mild left ventricular hypertrophy. Left ventricular diastolic parameters are consistent with Grade I diastolic dysfunction (impaired relaxation). Normal left ventricular filling pressure. Right Ventricle: The right ventricular size is normal. Right vetricular wall thickness was not well visualized. Right ventricular systolic function is normal. Tricuspid regurgitation signal is inadequate for assessing PA pressure. Left Atrium: Left  atrial size was normal in size. Right Atrium: Right atrial size was normal in size. Pericardium: There is no evidence of pericardial effusion. Mitral Valve: The mitral valve is normal in structure. Mild mitral valve regurgitation. No evidence of mitral valve stenosis. Tricuspid Valve: The tricuspid valve is abnormal. Tricuspid valve regurgitation is mild . No evidence of tricuspid stenosis. Aortic Valve: The aortic valve was not well visualized. Aortic valve regurgitation is not visualized. No aortic stenosis is present. Aortic valve mean gradient measures 5.1 mmHg. Aortic valve peak gradient measures 9.9 mmHg. Aortic valve area, by VTI measures 2.37 cm. Pulmonic Valve: The pulmonic valve was not well visualized. Pulmonic valve regurgitation is not visualized. No evidence of pulmonic stenosis. Aorta: The aortic root and ascending aorta are structurally normal, with no evidence of dilitation. There is mild dilatation of the ascending aorta, measuring 37 mm. Venous: The inferior vena cava is normal in size with greater than 50% respiratory variability, suggesting right atrial pressure of 3 mmHg. IAS/Shunts: The interatrial septum was not well visualized.  LEFT VENTRICLE PLAX 2D LVIDd:         3.30 cm     Diastology LVIDs:         2.50 cm     LV e' medial:    5.11 cm/s LV PW:         1.20 cm     LV E/e' medial:  10.7 LV IVS:        1.20 cm     LV e' lateral:   6.42 cm/s LVOT diam:     2.10 cm     LV E/e' lateral: 8.6 LV SV:         65 LV SV Index:   37          2D Longitudinal Strain LVOT Area:     3.46 cm    2D Strain GLS Avg:     -16.2 %  LV Volumes (MOD) LV vol d, MOD A2C: 43.1 ml 3D Volume EF: LV vol d, MOD A4C: 80.4 ml 3D EF:        53 % LV vol s, MOD A2C: 20.3 ml LV EDV:       103 ml LV vol s, MOD A4C: 29.7 ml LV ESV:       49 ml LV SV MOD A2C:     22.8 ml LV SV:        54 ml LV SV MOD A4C:     80.4 ml LV SV MOD BP:      37.6 ml RIGHT VENTRICLE             IVC RV Basal diam:  2.80 cm     IVC diam: 1.60 cm RV S  prime:     18.40 cm/s TAPSE (M-mode): 1.8  cm LEFT ATRIUM             Index        RIGHT ATRIUM          Index LA diam:        2.20 cm 1.26 cm/m   RA Area:     9.63 cm LA Vol (A2C):   29.9 ml 17.08 ml/m  RA Volume:   16.20 ml 9.25 ml/m LA Vol (A4C):   35.8 ml 20.45 ml/m LA Biplane Vol: 33.9 ml 19.37 ml/m  AORTIC VALVE AV Area (Vmax):    2.58 cm AV Area (Vmean):   2.51 cm AV Area (VTI):     2.37 cm AV Vmax:           157.07 cm/s AV Vmean:          104.654 cm/s AV VTI:            0.276 m AV Peak Grad:      9.9 mmHg AV Mean Grad:      5.1 mmHg LVOT Vmax:         117.00 cm/s LVOT Vmean:        75.700 cm/s LVOT VTI:          0.189 m LVOT/AV VTI ratio: 0.68  AORTA Ao Root diam: 3.20 cm Ao Asc diam:  3.70 cm MITRAL VALVE MV Area (PHT): 2.60 cm    SHUNTS MV Decel Time: 292 msec    Systemic VTI:  0.19 m MR Peak grad: 105.3 mmHg   Systemic Diam: 2.10 cm MR Vmax:      513.00 cm/s MV E velocity: 54.90 cm/s MV A velocity: 82.90 cm/s MV E/A ratio:  0.66 Dina Rich MD Electronically signed by Dina Rich MD Signature Date/Time: 07/22/2023/11:28:24 AM    Final    DG Chest 2 View  Result Date: 07/17/2023 CLINICAL DATA:  Chest pain EXAM: CHEST - 2 VIEW COMPARISON:  Chest x-ray dated July 10, 2023 FINDINGS: Cardiac and mediastinal contours are unchanged. Stable position of right chest wall port. Lungs are clear. Unchanged pulmonary nodules of the left hemithorax, likely granulomas. No evidence of pleural effusion or pneumothorax. IMPRESSION: No active cardiopulmonary disease. Electronically Signed   By: Allegra Lai M.D.   On: 07/17/2023 10:51   DG FEMUR, MIN 2 VIEWS RIGHT  Result Date: 07/10/2023 CLINICAL DATA:  metastatic breast cancer w/bony metastisis EXAM: RIGHT FEMUR 2 VIEWS COMPARISON:  June 08, 2023 FINDINGS: There are at least 3 lytic lesions noted in the proximal RIGHT femur including within the femoral head and proximal RIGHT shaft. A lytic lesion of the anterior proximal femoral shaft  appears to involve nearly the entire anterior cortex. No acute fracture. IMPRESSION: Multiple lytic lesions of the proximal RIGHT femur. Given apparent involvement of the near complete anterior cortex of the proximal femoral shaft, this places patient at increased risk of pathologic fracture. Recommend orthopedic oncology consultation and evaluation. Electronically Signed   By: Meda Klinefelter M.D.   On: 07/10/2023 13:09   CT Angio Chest PE W and/or Wo Contrast  Result Date: 07/10/2023 CLINICAL DATA:  Pulmonary embolism (PE) suspected, high prob. History of metastatic breast cancer * Tracking Code: BO * EXAM: CT ANGIOGRAPHY CHEST WITH CONTRAST TECHNIQUE: Multidetector CT imaging of the chest was performed using the standard protocol during bolus administration of intravenous contrast. Multiplanar CT image reconstructions and MIPs were obtained to evaluate the vascular anatomy. RADIATION DOSE REDUCTION: This exam was performed according to the departmental dose-optimization  program which includes automated exposure control, adjustment of the mA and/or kV according to patient size and/or use of iterative reconstruction technique. CONTRAST:  75mL OMNIPAQUE IOHEXOL 350 MG/ML SOLN COMPARISON:  June 08, 2023, May 28, 2022, Feb 03, 2021 FINDINGS: Cardiovascular: Heart is mildly enlarged. No pericardial effusion. RIGHT chest port with tip terminating at the superior cavoatrial junction. Adequate contrast opacification of the pulmonary arteries through the proximal subsegmental pulmonary arteries. No evidence of acute pulmonary embolism. Thoracic aorta is normal in course and caliber scattered atherosclerotic calcifications. Predominately LEFT-sided coronary artery atherosclerotic calcifications. Mediastinum/Nodes: Revisualization of multiple coarsely calcified mediastinal and hilar lymph nodes, consistent with sequela of remote granulomatous infection. Status post LEFT axillary node dissection. No new  suspicious RIGHT axillary adenopathy. Lungs/Pleura: No pleural effusion or pneumothorax. Revisualization of multiple coarsely calcified pulmonary nodules consistent with sequela of prior granulomatous infection. LEFT lower lobe pulmonary nodule with central coarse calcification measures 11 mm and is unchanged compared to more remote priors, consistent with a benign etiology and sequela of remote prior granulomatous infection (series 5, image 113). Unchanged clustered nodularity of the LEFT lower lobe since more remote priors, consistent with sequela of prior infection. There is increased bronchial wall thickening with faint interlobular septal thickening. Upper Abdomen: No acute abnormality. Revisualization of a likely epidermal inclusion cyst of the LEFT anterior abdominal wall. Musculoskeletal: Status post LEFT mastectomy. Similar appearance of mixed lytic and sclerotic lesions throughout the thoracic spine and several ribs consistent with known osseous metastatic disease. No new fracture deformities identified. Review of the MIP images confirms the above findings. IMPRESSION: 1. No evidence of acute pulmonary embolism. 2. There is increased bronchial wall thickening with faint interlobular septal thickening. This is nonspecific and could reflect bronchitis or mild pulmonary edema. 3. Similar appearance of osseous metastatic disease. Aortic Atherosclerosis (ICD10-I70.0). Electronically Signed   By: Meda Klinefelter M.D.   On: 07/10/2023 13:05   DG Chest 2 View  Result Date: 07/10/2023 CLINICAL DATA:  Palpitations EXAM: CHEST - 2 VIEW COMPARISON:  03/24/2023 FINDINGS: Port in the anterior chest wall with tip in distal SVC. Normal mediastinum and cardiac silhouette. Normal pulmonary vasculature. No evidence of effusion, infiltrate, or pneumothorax. No acute bony abnormality. LEFT axillary nodal dissection clips. IMPRESSION: 1. No acute cardiopulmonary process. 2. Port in place. Electronically Signed   By:  Genevive Bi M.D.   On: 07/10/2023 11:22   NM Bone Scan Whole Body  Result Date: 07/05/2023 CLINICAL DATA:  Metastatic disease evaluation EXAM: NUCLEAR MEDICINE WHOLE BODY BONE SCAN TECHNIQUE: Whole body anterior and posterior images were obtained approximately 3 hours after intravenous injection of radiopharmaceutical. RADIOPHARMACEUTICALS:  20.5 mCi Technetium-33m MDP IV COMPARISON:  Brain MR 04/23/23, CT CAP 06/08/23 FINDINGS: There is a focal region of increased tracer uptake along the midline frontal calvarium, suspicious for a calvarial metastatic lesion. There is also increased tracer uptake along the posterior right sixth rib and anterior right 2/3 rib, worrisome for osseous metastatic disease Increased tracer uptake of the T9 vertebral body is compatible with known metastatic disease. There is also increased tracer uptake over the sacrum Tracer uptake along the bilateral iliac crests and asymmetrically increased tracer uptake along the left acetabulum is suspicious for osseous metastatic disease. There is also asymmetrically increased tracer uptake along the proximal right femur in the proximal tibia. Recommend correlation with dedicated radiographs fixed with the possibility of osseous metastatic lesions. Nonspecific tracer uptake in the region of the right glenoid without a definite correlate on prior CT chest  abdomen pelvis dated 06/08/2023. There is degenerative uptake around the bilateral ankle joints, bilateral humeral heads, and bilateral elbow joints. IMPRESSION: No prior scintigraphic imaging was available for comparison at the time of dictation. Within this limitation, there is evidence of osseous metastatic disease in the axial and appendicular skeleton, including the frontal calvarium, thoracic spine, sacrum, bilateral iliac crests, the right acetabulum, right femur, and possibly the right proximal tibia. Electronically Signed   By: Lorenza Cambridge M.D.   On: 07/05/2023 08:49

## 2023-07-26 NOTE — Patient Instructions (Signed)
 [facility] - A DEPT OF [facility]  Discharge Instructions: Thank you for choosing [facility] to provide your oncology and hematology care.  If you have a lab appointment with the Cancer Center - please note that after April 8th, 2024, all labs will be drawn in the cancer center.  You do not have to check in or register with the main entrance as you have in the past but will complete your check-in in the cancer center.  Wear comfortable clothing and clothing appropriate for easy access to any Portacath or PICC line.   We strive to give you quality time with your provider. You may need to reschedule your appointment if you arrive late (15 or more minutes).  Arriving late affects you and other patients whose appointments are after yours.  Also, if you miss three or more appointments without notifying the office, you may be dismissed from the clinic at the provider's discretion.      For prescription refill requests, have your pharmacy contact our office and allow 72 hours for refills to be completed.    Today you received the following chemotherapy and/or immunotherapy agents Enhertu.  Fam-Trastuzumab Deruxtecan Injection What is this medication? FAM-TRASTUZUMAB DERUXTECAN (fam-tras TOOZ eu mab DER ux TEE kan) treats some types of cancer. It works by blocking a protein that causes cancer cells to grow and multiply. This helps to slow or stop the spread of cancer cells. This medicine may be used for other purposes; ask your health care provider or pharmacist if you have questions. COMMON BRAND NAME(S): ENHERTU What should I tell my care team before I take this medication? They need to know if you have any of these conditions: Heart disease Heart failure Infection, especially a viral infection, such as chickenpox, cold sores, or herpes Liver disease Lung or breathing disease, such as asthma or COPD An unusual or allergic reaction to  fam-trastuzumab deruxtecan, other medications, foods, dyes, or preservatives Pregnant or trying to get pregnant Breast-feeding How should I use this medication? This medication is injected into a vein. It is given by your care team in a hospital or clinic setting. A special MedGuide will be given to you before each treatment. Be sure to read this information carefully each time. Talk to your care team about the use of this medication in children. Special care may be needed. Overdosage: If you think you have taken too much of this medicine contact a poison control center or emergency room at once. NOTE: This medicine is only for you. Do not share this medicine with others. What if I miss a dose? It is important not to miss your dose. Call your care team if you are unable to keep an appointment. What may interact with this medication? Interactions are not expected. This list may not describe all possible interactions. Give your health care provider a list of all the medicines, herbs, non-prescription drugs, or dietary supplements you use. Also tell them if you smoke, drink alcohol, or use illegal drugs. Some items may interact with your medicine. What should I watch for while using this medication? Visit your care team for regular checks on your progress. Tell your care team if your symptoms do not start to get better or if they get worse. Your condition will be monitored carefully while you are receiving this medication. Do not become pregnant while taking this medication or for 7 months after stopping it. Women should inform their care team  if they wish to become pregnant or think they might be pregnant. Men should not father a child while taking this medication and for 4 months after stopping it. There is potential for serious side effects to an unborn child. Talk to your care team for more information. Do not breast-feed an infant while taking this medication or for 7 months after the last  dose. This medication has caused decreased sperm counts in some men. This may make it more difficult to father a child. Talk to your care team if you are concerned about your fertility. This medication may increase your risk to bruise or bleed. Call your care team if you notice any unusual bleeding. Be careful brushing or flossing your teeth or using a toothpick because you may get an infection or bleed more easily. If you have any dental work done, tell your dentist you are receiving this medication. This medication may cause dry eyes and blurred vision. If you wear contact lenses, you may feel some discomfort. Lubricating eye drops may help. See your care team if the problem does not go away or is severe. This medication may increase your risk of getting an infection. Call your care team for advice if you get a fever, chills, sore throat, or other symptoms of a cold or flu. Do not treat yourself. Try to avoid being around people who are sick. Avoid taking medications that contain aspirin, acetaminophen, ibuprofen, naproxen, or ketoprofen unless instructed by your care team. These medications may hide a fever. What side effects may I notice from receiving this medication? Side effects that you should report to your care team as soon as possible: Allergic reactions--skin rash, itching, hives, swelling of the face, lips, tongue, or throat Dry cough, shortness of breath or trouble breathing Infection--fever, chills, cough, sore throat, wounds that don't heal, pain or trouble when passing urine, general feeling of discomfort or being unwell Heart failure--shortness of breath, swelling of the ankles, feet, or hands, sudden weight gain, unusual weakness or fatigue Unusual bruising or bleeding Side effects that usually do not require medical attention (report these to your care team if they continue or are bothersome): Constipation Diarrhea Hair loss Muscle pain Nausea Vomiting This list may not  describe all possible side effects. Call your doctor for medical advice about side effects. You may report side effects to FDA at 1-800-FDA-1088. Where should I keep my medication? This medication is given in a hospital or clinic. It will not be stored at home. NOTE: This sheet is a summary. It may not cover all possible information. If you have questions about this medicine, talk to your doctor, pharmacist, or health care provider.  2024 Elsevier/Gold Standard (2021-06-17 00:00:00)        To help prevent nausea and vomiting after your treatment, we encourage you to take your nausea medication as directed.  BELOW ARE SYMPTOMS THAT SHOULD BE REPORTED IMMEDIATELY: *FEVER GREATER THAN 100.4 F (38 C) OR HIGHER *CHILLS OR SWEATING *NAUSEA AND VOMITING THAT IS NOT CONTROLLED WITH YOUR NAUSEA MEDICATION *UNUSUAL SHORTNESS OF BREATH *UNUSUAL BRUISING OR BLEEDING *URINARY PROBLEMS (pain or burning when urinating, or frequent urination) *BOWEL PROBLEMS (unusual diarrhea, constipation, pain near the anus) TENDERNESS IN MOUTH AND THROAT WITH OR WITHOUT PRESENCE OF ULCERS (sore throat, sores in mouth, or a toothache) UNUSUAL RASH, SWELLING OR PAIN  UNUSUAL VAGINAL DISCHARGE OR ITCHING   Items with * indicate a potential emergency and should be followed up as soon as possible or go to the  Emergency Department if any problems should occur.  Please show the CHEMOTHERAPY ALERT CARD or IMMUNOTHERAPY ALERT CARD at check-in to the Emergency Department and triage nurse.  Should you have questions after your visit or need to cancel or reschedule your appointment, please contact [facility] - A DEPT OF [facility] [phone]  and follow the prompts.  Office hours are 8:00 a.m. to 4:30 p.m. Monday - Friday. Please note that voicemails left after 4:00 p.m. may not be returned until the following business day.  We are closed weekends and major holidays. You have access to a  nurse at all times for urgent questions. Please call the main number to the clinic [phone] and follow the prompts.  For any non-urgent questions, you may also contact your provider using MyChart. We now offer e-Visits for anyone 75 and older to request care online for non-urgent symptoms. For details visit [email or web address].   Also download the MyChart app! Go to the app store, search "MyChart", open the app, select , and log in with your MyChart username and password.

## 2023-07-26 NOTE — Progress Notes (Signed)
Patient presents today for Enhertu infusion.  Patient is in satisfactory condition with no new complaints voiced.  Vital signs are stable.  Labs reviewed by Dr. Ellin Saba during her office visit.  All labs are within treatment parameters.  Patient is refusing Enhertu today due to the upcoming holidays.  Calcium today is 8.2.  We will give Zometa 4 mg IV x one dose today per Dr. Ellin Saba.  Additional labs also drawn today prior to Zometa.  We will proceed with infusion per provider orders.   Patient tolerated infusion well with no complaints voiced.  Patient left ambulatory with daughter in stable condition.  Vital signs stable at discharge.  Follow up as scheduled.

## 2023-07-26 NOTE — Progress Notes (Unsigned)
Patient has been examined by Dr. Katragadda. Vital signs (HR 102) and labs have been reviewed by MD - ANC, Creatinine, LFTs, hemoglobin, and platelets are within treatment parameters per M.D. - pt may proceed with treatment.  Primary RN and pharmacy notified.  

## 2023-07-27 ENCOUNTER — Encounter: Payer: Self-pay | Admitting: Hematology

## 2023-07-27 ENCOUNTER — Encounter (HOSPITAL_COMMUNITY): Payer: Self-pay | Admitting: Hematology

## 2023-07-27 LAB — CANCER ANTIGEN 27.29: CA 27.29: 63.3 U/mL — ABNORMAL HIGH (ref 0.0–38.6)

## 2023-07-27 LAB — CANCER ANTIGEN 15-3: CA 15-3: 39.1 U/mL — ABNORMAL HIGH (ref 0.0–25.0)

## 2023-07-28 ENCOUNTER — Telehealth: Payer: Self-pay | Admitting: Radiation Oncology

## 2023-07-28 ENCOUNTER — Ambulatory Visit: Payer: Medicare Other

## 2023-07-28 NOTE — Telephone Encounter (Signed)
I called the patient to review Dr. Joellen Jersey recommendation for radiation in conjunction with upcoming plans for surgical pinning of the right femur. I asked her to call me back to discuss further and to find out who she would be seeing in orthopedics.

## 2023-08-03 ENCOUNTER — Ambulatory Visit: Payer: Medicare Other | Admitting: Orthopaedic Surgery

## 2023-08-04 ENCOUNTER — Telehealth: Payer: Self-pay | Admitting: Radiation Oncology

## 2023-08-04 ENCOUNTER — Other Ambulatory Visit: Payer: Self-pay

## 2023-08-04 NOTE — Telephone Encounter (Signed)
I called the patient to follow up with her and to let her know Dr. [name] would recommend postoperative radiation after she undergoes pinning of her femur. She meets with orthopedics on 08/17/23. She's been struggling with nausea and vomiting over a week she has tried zofran and phenergan without relief. She also had been tapering steroids and was down to every other day 2 mg of dexamethasone. I encouraged her to go back to taking 4 mg daily starting tomorrow, but to take two tablets which would be 8 mg tonight. 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 We will do a slower taper if her symptoms improve with this change. She will get her refill from [facility] in [place], and continue protonix. If she is struggling over the weekend she is in agreement to go to the ER.

## 2023-08-07 ENCOUNTER — Emergency Department (HOSPITAL_COMMUNITY)
Admission: EM | Admit: 2023-08-07 | Discharge: 2023-08-07 | Disposition: A | Discharge status: Home / Self Care | Payer: Medicare Other | Attending: Emergency Medicine | Admitting: Emergency Medicine

## 2023-08-07 ENCOUNTER — Other Ambulatory Visit: Payer: Self-pay

## 2023-08-07 ENCOUNTER — Encounter (HOSPITAL_COMMUNITY): Payer: Self-pay | Admitting: Family Medicine

## 2023-08-07 DIAGNOSIS — R112 Nausea with vomiting, unspecified: Secondary | ICD-10-CM | POA: Diagnosis not present

## 2023-08-07 DIAGNOSIS — D0592 Unspecified type of carcinoma in situ of left breast: Secondary | ICD-10-CM | POA: Diagnosis not present

## 2023-08-07 DIAGNOSIS — K2900 Acute gastritis without bleeding: Secondary | ICD-10-CM

## 2023-08-07 DIAGNOSIS — R0789 Other chest pain: Secondary | ICD-10-CM | POA: Diagnosis not present

## 2023-08-07 DIAGNOSIS — C7931 Secondary malignant neoplasm of brain: Secondary | ICD-10-CM | POA: Diagnosis not present

## 2023-08-07 DIAGNOSIS — Y842 Radiological procedure and radiotherapy as the cause of abnormal reaction of the patient, or of later complication, without mention of misadventure at the time of the procedure: Secondary | ICD-10-CM

## 2023-08-07 DIAGNOSIS — C50912 Malignant neoplasm of unspecified site of left female breast: Secondary | ICD-10-CM

## 2023-08-07 DIAGNOSIS — D649 Anemia, unspecified: Secondary | ICD-10-CM | POA: Diagnosis not present

## 2023-08-07 LAB — URINALYSIS, ROUTINE W REFLEX MICROSCOPIC
Bilirubin Urine: NEGATIVE
Glucose, UA: NEGATIVE mg/dL
Hgb urine dipstick: NEGATIVE
Ketones, ur: NEGATIVE mg/dL
Leukocytes,Ua: NEGATIVE
Nitrite: NEGATIVE
Protein, ur: NEGATIVE mg/dL
Specific Gravity, Urine: 1.005 (ref 1.005–1.030)
pH: 7 (ref 5.0–8.0)

## 2023-08-07 LAB — CBC WITH DIFFERENTIAL/PLATELET
Abs Immature Granulocytes: 0.02 10*3/uL (ref 0.00–0.07)
Basophils Absolute: 0 10*3/uL (ref 0.0–0.1)
Basophils Relative: 0 %
Eosinophils Absolute: 0 10*3/uL (ref 0.0–0.5)
Eosinophils Relative: 0 %
HCT: 34.2 % — ABNORMAL LOW (ref 36.0–46.0)
Hemoglobin: 11.4 g/dL — ABNORMAL LOW (ref 12.0–15.0)
Immature Granulocytes: 0 %
Lymphocytes Relative: 19 %
Lymphs Abs: 0.8 10*3/uL (ref 0.7–4.0)
MCH: 36 pg — ABNORMAL HIGH (ref 26.0–34.0)
MCHC: 33.3 g/dL (ref 30.0–36.0)
MCV: 107.9 fL — ABNORMAL HIGH (ref 80.0–100.0)
Monocytes Absolute: 0.2 10*3/uL (ref 0.1–1.0)
Monocytes Relative: 5 %
Neutro Abs: 3.4 10*3/uL (ref 1.7–7.7)
Neutrophils Relative %: 76 %
Platelets: 230 10*3/uL (ref 150–400)
RBC: 3.17 MIL/uL — ABNORMAL LOW (ref 3.87–5.11)
RDW: 14 % (ref 11.5–15.5)
WBC: 4.5 10*3/uL (ref 4.0–10.5)
nRBC: 0 % (ref 0.0–0.2)

## 2023-08-07 LAB — COMPREHENSIVE METABOLIC PANEL
ALT: 13 U/L (ref 0–44)
AST: 15 U/L (ref 15–41)
Albumin: 3.4 g/dL — ABNORMAL LOW (ref 3.5–5.0)
Alkaline Phosphatase: 77 U/L (ref 38–126)
Anion gap: 8 (ref 5–15)
BUN: 13 mg/dL (ref 8–23)
CO2: 23 mmol/L (ref 22–32)
Calcium: 8.2 mg/dL — ABNORMAL LOW (ref 8.9–10.3)
Chloride: 103 mmol/L (ref 98–111)
Creatinine, Ser: 0.61 mg/dL (ref 0.44–1.00)
GFR, Estimated: >60 mL/min (ref >60)
Glucose, Bld: 121 mg/dL — ABNORMAL HIGH (ref 70–99)
Potassium: 4.1 mmol/L (ref 3.5–5.1)
Sodium: 134 mmol/L — ABNORMAL LOW (ref 135–145)
Total Bilirubin: 0.8 mg/dL (ref <1.2)
Total Protein: 6.3 g/dL — ABNORMAL LOW (ref 6.5–8.1)

## 2023-08-07 LAB — TROPONIN I (HIGH SENSITIVITY)
Troponin I (High Sensitivity): 4 ng/L (ref <18)
Troponin I (High Sensitivity): 4 ng/L (ref <18)

## 2023-08-07 LAB — LIPASE, BLOOD: Lipase: 28 U/L (ref 11–51)

## 2023-08-07 MED ORDER — PROCHLORPERAZINE MALEATE 10 MG PO TABS: 10.0000 mg | ORAL_TABLET | Freq: Two times a day (BID) | ORAL | 0 refills | Status: DC | PRN

## 2023-08-07 MED ORDER — DIPHENHYDRAMINE HCL 25 MG PO TABS: 25.0000 mg | ORAL_TABLET | Freq: Four times a day (QID) | ORAL | 0 refills | Status: DC

## 2023-08-07 MED ORDER — DIPHENHYDRAMINE HCL 50 MG/ML IJ SOLN
12.5000 mg | Freq: Once | INTRAMUSCULAR | Status: AC
  Administered 2023-08-07: 12.5 mg via INTRAVENOUS
  Filled 2023-08-07: qty 1

## 2023-08-07 MED ORDER — LACTATED RINGERS IV BOLUS
1000.0000 mL | Freq: Once | INTRAVENOUS | Status: AC
  Administered 2023-08-07: 1000 mL via INTRAVENOUS

## 2023-08-07 MED ORDER — PROCHLORPERAZINE EDISYLATE 10 MG/2ML IJ SOLN
10.0000 mg | Freq: Once | INTRAMUSCULAR | Status: AC
  Administered 2023-08-07: 10 mg via INTRAVENOUS
  Filled 2023-08-07: qty 2

## 2023-08-07 MED ORDER — HEPARIN SOD (PORK) LOCK FLUSH 100 UNIT/ML IV SOLN
500.0000 [IU] | Freq: Once | INTRAVENOUS | Status: AC
  Administered 2023-08-07: 500 [IU]
  Filled 2023-08-07: qty 5

## 2023-08-07 MED ORDER — PANTOPRAZOLE SODIUM 40 MG PO TBEC: 40.0000 mg | DELAYED_RELEASE_TABLET | Freq: Two times a day (BID) | ORAL | 3 refills | Status: DC

## 2023-08-07 MED ORDER — PANTOPRAZOLE SODIUM 40 MG IV SOLR
40.0000 mg | Freq: Once | INTRAVENOUS | Status: AC
  Administered 2023-08-07: 40 mg via INTRAVENOUS
  Filled 2023-08-07: qty 10

## 2023-08-07 NOTE — ED Provider Notes (Signed)
[place] EMERGENCY DEPARTMENT AT [facility] Provider Note   CSN: [id number] Arrival date & time: 08/07/23  0102     History Invasive lobular carcinoma of the breast with metastasis currently receiving treatment.   Chief Complaint  Patient presents with   Nausea   Dehydration    Denise [name] is a 72 y.o. female.  She's been struggling with nausea and vomiting for the last four days. No diarrhea. Last bowel movement was yesterday. Daily bowel movements. Patient says that she ran out of her protonix. Oncology telephone note also mentions that she has tried zofran and phenergan without relief. Patient has leptomeningeal disease and had been taking steroids. She also had been tapering steroids and was down to every other day 2 mg of dexamethasone. Her oncologist told her to go back to taking 4 mg daily two days ago, but to take two tablets which would be 8 mg Friday night when she talked to her.   She has known leptomeningeal disease from her breast cancer and just finished whole brain radiation last month, but oncologist believes likely has inflammatory changes from her disease that has not had enough time to respond to radiation. They were planning do a slower taper if her symptoms improve with this change. She is also on protonix. Denies chest pain different from prior. Endorses mild abdominal discomfort from vomiting.   No dysuria. No fevers. No vision changes, or neurologic deficits. Does endorse headaches that have been going on for multiple months. Has constant headache at the top of her head. Has photophobia. No changes with position. No acute changes to the headache.  Patient also mentions dysphagia that has been going on for the last 2 months. She finds it difficult to swallow pills without putting it in apple sauce or mashed potatoes first and finds it difficult to swallow food without effort. Has been to a gastroenterologist in 03/2023. At that time patient did not  have dysphagia only nausea vomiting and diarrhea thought to be related to COVID and erosive gastropathy. She was prescribed protonix 40 BID. Gastroenterologist recommended repeat EGD if symptoms did not improve. Upper endoscopy 2019 with biopsies negative for H. pylori and celiac.    Patient does also endorse some chest discomfort when sitting up and leaning forward. This is similar to her previous chest pain. She reports palpitations when lying down at nighttime.  She had 2 ER visits on 07/10/2023 and 07/17/2023 for the same reason.  They have given her lidocaine patch which helped after workup did not reveal any major problems. Saw Onc on 11/11. Was prescribed zoloft 25 mg for possible anxiety attacks as she has been having palpitations and was referred to cardiology. Patient reported chest pain resolved with lidocaine patches.    The history is provided by the patient.       Home Medications Prior to Admission medications   Medication Sig Start Date End Date Taking? Authorizing Provider  diphenhydrAMINE (BENADRYL) 25 MG tablet Take 1 tablet (25 mg total) by mouth every 6 (six) hours. 08/07/23  Yes Lockie Mola, MD  prochlorperazine (COMPAZINE) 10 MG tablet Take 1 tablet (10 mg total) by mouth 2 (two) times daily as needed for nausea or vomiting. 08/07/23  Yes Lockie Mola, MD  Acetaminophen (TYLENOL PO) Take 1 tablet by mouth as needed.    [provider]  dexamethasone (DECADRON) 4 MG tablet Take 1 tablet (4 mg total) by mouth 2 (two) times daily with a meal. 06/21/23  Ronny Bacon, PA-C  gabapentin (NEURONTIN) 300 MG capsule Take 1 capsule by mouth 2 (two) times daily. 11/05/20   [provider]  hydrALAZINE (APRESOLINE) 25 MG tablet Take 25 mg by mouth 3 (three) times daily. 07/16/22   [provider]  Lactulose 20 GM/30ML SOLN Take 15 mLs (10 g total) by mouth at bedtime as needed. 04/12/23   [name], MD  levothyroxine (SYNTHROID) 75 MCG  tablet Take 75 mcg by mouth daily before breakfast.    [provider]  lidocaine (XYLOCAINE) 2 % solution Use as directed 15 mLs in the mouth or throat as needed (reflux). 03/24/23   Burgess Amor, PA-C  Melatonin 5 MG TABS Take 10 mg by mouth. As needed for sleep    [provider]  metoCLOPramide (REGLAN) 10 MG tablet Take 1 tablet (10 mg total) by mouth every 6 (six) hours as needed for nausea or vomiting. 03/24/23   Idol, [name], PA-C  midodrine (PROAMATINE) 10 MG tablet Take 10 mg by mouth daily. 02/11/23   [provider]  olmesartan-hydrochlorothiazide (BENICAR HCT) 40-25 MG tablet Take 1 tablet by mouth daily. 07/16/22   [provider]  ondansetron (ZOFRAN) 4 MG tablet Take 1 tablet (4 mg total) by mouth every 8 (eight) hours as needed for nausea or vomiting. 07/05/23   [name], MD  pantoprazole (PROTONIX) 40 MG tablet Take 1 tablet (40 mg total) by mouth 2 (two) times daily before a meal. 08/07/23   Lockie Mola, MD  promethazine (PHENERGAN) 25 MG suppository Place 1 suppository (25 mg total) rectally every 6 (six) hours as needed for nausea or vomiting. 03/28/23   Bethann Berkshire, MD  sertraline (ZOLOFT) 25 MG tablet Take 1 tablet (25 mg total) by mouth daily. 07/26/23   [name], MD  sucralfate (CARAFATE) 1 g tablet Take 1 tablet (1 g total) by mouth every 4 (four) hours as needed. 03/30/23   [name], MD      Allergies    Exforge [amlodipine besylate-valsartan], Gadolinium derivatives, Gadopiclenol, Tape, Cheese, Levofloxacin in d5w, Other, Proanthocyanidin, Strawberry extract, Wild lettuce extract (lactuca virosa), and Yeast-derived drug products    Review of Systems   Review of Systems  Constitutional:  Positive for appetite change and fatigue. Negative for fever.  HENT:  Positive for trouble swallowing. Negative for congestion, hearing loss and sinus pressure.   Respiratory:  Negative for cough and shortness of  breath.   Cardiovascular:  Positive for chest pain and palpitations. Negative for leg swelling.  Gastrointestinal:  Positive for nausea and vomiting. Negative for abdominal distention, abdominal pain, blood in stool, constipation and diarrhea.  Genitourinary:  Negative for dysuria.  Musculoskeletal:  Negative for neck pain and neck stiffness.  Allergic/Immunologic: Positive for immunocompromised state.  Neurological:  Positive for tremors and light-headedness.  Psychiatric/Behavioral:  Positive for sleep disturbance.     Physical Exam Updated Vital Signs BP (!) 140/94 (BP Location: Left Arm)   Pulse 76   Temp 98 F (36.7 C) (Oral)   Resp 19   Ht 5\' 5"  (1.651 m)   Wt 72.6 kg   SpO2 99%   BMI 26.63 kg/m  Physical Exam Constitutional:      General: She is not in acute distress.    Comments: Chronically ill appearing, in no acute distress  HENT:     Head: Normocephalic and atraumatic.     Right Ear: External ear normal.     Left Ear: External ear normal.  Nose: No congestion.     Mouth/Throat:     Mouth: Mucous membranes are dry.     Pharynx: No oropharyngeal exudate or posterior oropharyngeal erythema.  Eyes:     Extraocular Movements: Extraocular movements intact.     Conjunctiva/sclera: Conjunctivae normal.     Pupils: Pupils are equal, round, and reactive to light.  Cardiovascular:     Rate and Rhythm: Normal rate and regular rhythm.     Pulses: Normal pulses.     Heart sounds: Normal heart sounds.  Pulmonary:     Effort: Pulmonary effort is normal.     Breath sounds: Normal breath sounds.  Abdominal:     General: Abdomen is flat. Bowel sounds are normal.     Palpations: Abdomen is soft.     Tenderness: There is no abdominal tenderness. There is no right CVA tenderness or left CVA tenderness.  Musculoskeletal:        General: No tenderness. Normal range of motion.     Cervical back: Normal range of motion and neck supple. No rigidity or tenderness.     Right  lower leg: No edema.     Left lower leg: No edema.  Skin:    General: Skin is warm and dry.     Capillary Refill: Capillary refill takes 2 to 3 seconds.  Neurological:     General: No focal deficit present.     Mental Status: She is alert and oriented to person, place, and time.     Cranial Nerves: No cranial nerve deficit.     Sensory: No sensory deficit.     Motor: No weakness.     ED Results / Procedures / Treatments   Labs (all labs ordered are listed, but only abnormal results are displayed) Labs Reviewed  COMPREHENSIVE METABOLIC PANEL - Abnormal; Notable for the following components:      Result Value   Sodium 134 (*)    Glucose, Bld 121 (*)    Calcium 8.2 (*)    Total Protein 6.3 (*)    Albumin 3.4 (*)    All other components within normal limits  CBC WITH DIFFERENTIAL/PLATELET - Abnormal; Notable for the following components:   RBC 3.17 (*)    Hemoglobin 11.4 (*)    HCT 34.2 (*)    MCV 107.9 (*)    MCH 36.0 (*)    All other components within normal limits  URINALYSIS, ROUTINE W REFLEX MICROSCOPIC - Abnormal; Notable for the following components:   Color, Urine STRAW (*)    All other components within normal limits  LIPASE, BLOOD  TROPONIN I (HIGH SENSITIVITY)  TROPONIN I (HIGH SENSITIVITY)    EKG EKG Interpretation Date/Time:  Saturday August 07 2023 09:15:41 EST Ventricular Rate:  74 PR Interval:  138 QRS Duration:  94 QT Interval:  410 QTC Calculation: 455 R Axis:   6  Text Interpretation: Sinus rhythm Abnormal R-wave progression, early transition Left ventricular hypertrophy No significant change since last tracing Confirmed by Jacalyn Lefevre [phone]) on 08/07/2023 9:25:41 AM  Radiology No results found.  Procedures Procedures    Medications Ordered in ED Medications  lactated ringers bolus 1,000 mL (0 mLs Intravenous Stopped 08/07/23 1038)  pantoprazole (PROTONIX) injection 40 mg (40 mg Intravenous Given 08/07/23 0900)  prochlorperazine  (COMPAZINE) injection 10 mg (10 mg Intravenous Given 08/07/23 0902)  diphenhydrAMINE (BENADRYL) injection 12.5 mg (12.5 mg Intravenous Given 08/07/23 5284)    ED Course/ Medical Decision Making/ A&P Clinical Course as of 08/07/23 1219  Sat Aug 07, 2023  0919 Lipase: 28 Lipase wnl.   [AB]  0920 Comprehensive metabolic panel(!) No AKI, Electrolytes relatively stable from prior, LFTs wnl  [AB]  0920 CBC with Differential(!) Macrocytic anemia similar to prior  [AB]  0937 Troponin I (High Sensitivity): 4 First troponin reassuring  [AB]  1118 Urinalysis, Routine w reflex microscopic -Urine, Clean Catch(!) Unremarkable  [AB]  1124 Second troponin plateau and unremarkable  [AB]    Clinical Course User Index [AB] Lockie Mola, MD                                 Medical Decision Making This patient presents to the ED with chief complaint(s) of nausea and vomiting with pertinent past medical history of metastatic breast cancer with leptomeningeal involvement, h/o erosive gastropathy which further complicates the presenting complaint. The complaint involves an extensive differential diagnosis and also carries with it a high risk of complications and morbidity.    The differential diagnosis includes ACS, pyelonephritis, increased ICP secondary to brain mets, viral gastritis, hiatal hernia.     Additional history obtained: Records reviewed previous admission documents, Primary Care Documents, and Oncology and GI notes   Independent labs interpretation:  The following labs were independently interpreted: Troponin remained unremarkable. No AKI. Electrolytes stable. No leukocytosis.   Consideration for admission or further workup: Patient improved with antiemetics and passed po challenge thus will not admit at this time. Will advise patient to continue outpatient workup with follow up MRI for letptomeningeal metastasis as well GI for dysphagia and vomiting. Neurologically stable and without  deficits in the ED. Stable for discharge.   Patient did not have any concerning labs for chest pain and symptoms resolved. Most likely reflux as well as radiation pain.     Amount and/or Complexity of Data Reviewed Labs: ordered. Decision-making details documented in ED Course.  Risk OTC drugs. Prescription drug management.  Final Clinical Impression(s) / ED Diagnoses Final diagnoses:  Nausea and vomiting, unspecified vomiting type  Pain after radiation therapy  Carcinoma of left breast metastatic to brain [facility])    Rx / DC Orders ED Discharge Orders          Ordered    pantoprazole (PROTONIX) 40 MG tablet  2 times daily before meals        08/07/23 0922    diphenhydrAMINE (BENADRYL) 25 MG tablet  Every 6 hours        08/07/23 1158    prochlorperazine (COMPAZINE) 10 MG tablet  2 times daily PRN        08/07/23 1158              Lockie Mola, MD 08/07/23 1219    Jacalyn Lefevre, MD 08/10/23 [phone]

## 2023-08-07 NOTE — ED Triage Notes (Signed)
Pt complains of nausea and vomiting the past 3 days with chest pain. Pt has not been able to keep fluids or food down for the past 3 days. Cancer treatment nurse asked her to come in to the ED.

## 2023-08-07 NOTE — Discharge Instructions (Signed)
Your nausea and vomiting could be related to the cancer in the brain but could also be related to a cause related to your stomach given you also have trouble swallowing now. Please take protonix 40 mg twice a day and call to follow up with your gastroeneterologist. They were thinking of repeating an endoscopy if symptoms worsened after their visit last time.   We are also going to send some extra as needed nausea medication. Please take these two medicines together to prevent side effects. (Compazine and benadryl)

## 2023-08-07 NOTE — ED Notes (Signed)
Pt aware we need urine specimen.

## 2023-08-09 ENCOUNTER — Telehealth: Payer: Self-pay | Admitting: Radiation Oncology

## 2023-08-09 ENCOUNTER — Encounter: Payer: Self-pay | Admitting: Hematology

## 2023-08-09 ENCOUNTER — Other Ambulatory Visit: Payer: Self-pay | Admitting: Radiation Oncology

## 2023-08-09 ENCOUNTER — Encounter (HOSPITAL_COMMUNITY): Payer: Self-pay | Admitting: Hematology

## 2023-08-09 NOTE — Telephone Encounter (Signed)
I spoke with the patient today. She went to the ED and got IV fluids over the weekend. She's been taking Dexamethasone 4 mg now and feels this has helped somewhat. We discussed going to twice a day dosing and then we will follow up next week with her to see how she's doing, and see what orthopedics is recommending. She is in agreement with this plan.

## 2023-08-17 ENCOUNTER — Ambulatory Visit (INDEPENDENT_AMBULATORY_CARE_PROVIDER_SITE_OTHER): Payer: Medicare Other | Admitting: Orthopaedic Surgery

## 2023-08-17 ENCOUNTER — Encounter: Payer: Self-pay | Admitting: Orthopaedic Surgery

## 2023-08-17 DIAGNOSIS — C50919 Malignant neoplasm of unspecified site of unspecified female breast: Secondary | ICD-10-CM | POA: Diagnosis not present

## 2023-08-17 DIAGNOSIS — C799 Secondary malignant neoplasm of unspecified site: Secondary | ICD-10-CM | POA: Diagnosis not present

## 2023-08-17 DIAGNOSIS — M84451A Pathological fracture, right femur, initial encounter for fracture: Secondary | ICD-10-CM | POA: Diagnosis not present

## 2023-08-17 NOTE — Progress Notes (Signed)
Office Visit Note   Patient: Denise [name]           Date of Birth: 19-Apr-1951           MRN: [id number] Visit Date: 08/17/2023              Requested by: [name], MD [address] [place],  [place] [address] PCP: Toma Deiters, MD   Assessment & Plan: Visit Diagnoses:  1. Metastasis from breast cancer (HCC)   2. Pathological fracture, right femur, initial encounter for fracture [facility])     Plan: Patient is a 72 year old female with multiple metastases to the right femur with impending fracture.  Some of the lesions are eroding through the cortex and I am concerned that she will fracture through these.  Based on these findings I have recommended prophylactic IM nail.  Debbie met with the patient today to schedule surgery.  Total face to face encounter time was greater than 45 minutes and over half of this time was spent in counseling and/or coordination of care.   Follow-Up Instructions: No follow-ups on file.   Orders:  No orders of the defined types were placed in this encounter.  No orders of the defined types were placed in this encounter.     Procedures: No procedures performed   Clinical Data: No additional findings.   Subjective: Chief Complaint  Patient presents with   Right Thigh - Pain    HPI Denise [name] is a 72 year old female with metastatic breast cancer who comes in for evaluation of metastases to the right femur.  She reports not having pain in her leg.  Her oncologist is concerned about pathologic fracture.  Review of Systems  Constitutional: Negative.   HENT: Negative.    Eyes: Negative.   Respiratory: Negative.    Cardiovascular: Negative.   Endocrine: Negative.   Musculoskeletal: Negative.   Neurological: Negative.   Hematological: Negative.   Psychiatric/Behavioral: Negative.    All other systems reviewed and are negative.    Objective: Vital Signs: There were no vitals taken for this visit.  Physical Exam Vitals and  nursing note reviewed.  Constitutional:      Appearance: She is well-developed.  HENT:     Head: Atraumatic.     Nose: Nose normal.  Eyes:     Extraocular Movements: Extraocular movements intact.  Cardiovascular:     Pulses: Normal pulses.  Pulmonary:     Effort: Pulmonary effort is normal.  Abdominal:     Palpations: Abdomen is soft.  Musculoskeletal:     Cervical back: Neck supple.  Skin:    General: Skin is warm.     Capillary Refill: Capillary refill takes less than 2 seconds.  Neurological:     Mental Status: She is alert. Mental status is at baseline.  Psychiatric:        Behavior: Behavior normal.        Thought Content: Thought content normal.        Judgment: Judgment normal.     Ortho Exam Exam of the right femur is unremarkable. Specialty Comments:  No specialty comments available.  Imaging: No results found.   PMFS History: Patient Active Problem List   Diagnosis Date Noted   Pain after radiation therapy 08/07/2023   Leptomeningeal disease 05/27/2023   Viral gastroenteritis 04/01/2023   Acute superficial gastritis without hemorrhage 04/01/2023   Metastasis to brain (HCC) 07/20/2022   S/P left mastectomy 11/20/2021   Neutropenic fever (HCC) 07/11/2021  Drug-induced neutropenia (HCC) 04/10/2021   Pancytopenia, acquired (HCC) 04/02/2021   Livedo reticularis 04/02/2021   Port-A-Cath in place 02/26/2021   Genetic testing 02/24/2021   Family history of breast cancer    Invasive lobular carcinoma of left breast in female [facility]) 01/23/2021   Non-intractable vomiting with nausea 01/11/2018   Abdominal pain 01/11/2018   Rectal bleeding 01/11/2018   Past Medical History:  Diagnosis Date   Asthma    as child   Cancer (HCC) 12/2020   left breast IMC   Complication of anesthesia    pateitn states,' I coded when I had my D&Cmany years ago.   Dyspnea    Family history of breast cancer    Hypertension    Hypothyroidism    PONV (postoperative nausea and  vomiting)    Port-A-Cath in place 02/26/2021    Family History  Problem Relation Age of Onset   Breast cancer Mother        dx in her 30s   Heart disease Mother    Thyroid disease Mother    Lung cancer Father        dx in his 17s   Thyroid disease Maternal Aunt    Thyroid nodules Maternal Grandmother 35       goiter   Thyroid disease Maternal Grandmother    Heart disease Maternal Grandfather    Heart disease Paternal Grandmother    Heart disease Paternal Grandfather    Thyroid disease Daughter    Thyroid disease Daughter    Cancer Maternal Uncle        NOS   Cancer Paternal Uncle        NOS   Breast cancer Cousin        pat first cousin died in her 52s;     Past Surgical History:  Procedure Laterality Date   ABDOMINAL HYSTERECTOMY     BIOPSY  01/17/2018   Procedure: BIOPSY;  Surgeon: Malissa Hippo, MD;  [facility];  Service: Endoscopy;;  duodenum,gastric   CHOLECYSTECTOMY     COLONOSCOPY WITH PROPOFOL N/A 01/17/2018   Procedure: COLONOSCOPY WITH PROPOFOL;  Surgeon: Malissa Hippo, MD;  [facility]: [facility];  Service: Endoscopy;  Laterality: N/A;  7:30   DILATION AND CURETTAGE OF UTERUS     ESOPHAGOGASTRODUODENOSCOPY (EGD) WITH PROPOFOL N/A 01/17/2018   Procedure: ESOPHAGOGASTRODUODENOSCOPY (EGD) WITH PROPOFOL;  Surgeon: Malissa Hippo, MD;  [facility];  Service: Endoscopy;  Laterality: N/A;   POLYPECTOMY  01/17/2018   Procedure: POLYPECTOMY;  Surgeon: Malissa Hippo, MD;  [facility];  Service: Endoscopy;;  transverse colon, cecal   PORTACATH PLACEMENT Right 02/17/2021   Procedure: INSERTION PORT-A-CATH;  Surgeon: Abigail Miyamoto, MD;  Location: [facility];  Service: General;  Laterality: Right;   RADIOACTIVE SEED GUIDED AXILLARY SENTINEL LYMPH NODE Left 11/20/2021   Procedure: RADIOACTIVE SEED GUIDED LEFT AXILLARY SENTINEL LYMPH NODE DISSECTION;  Surgeon: Abigail Miyamoto, MD;  Location: [facility];   Service: General;  Laterality: Left;   TOTAL MASTECTOMY Left 11/20/2021   Procedure: LEFT TOTAL MASTECTOMY;  Surgeon: Abigail Miyamoto, MD;  Location:  SURGERY CENTER;  Service: General;  Laterality: Left;   Social History   Occupational History   Occupation: [employer]   Occupation: retired  Tobacco Use   Smoking status: Never   Smokeless tobacco: Never  Vaping Use   Vaping status: Never Used  Substance and Sexual Activity   Alcohol use: Never  Drug use: Never   Sexual activity: Not Currently    Birth control/protection: Surgical

## 2023-08-18 ENCOUNTER — Ambulatory Visit: Payer: Medicare Other

## 2023-08-18 ENCOUNTER — Inpatient Hospital Stay: Payer: Medicare Other

## 2023-08-18 ENCOUNTER — Inpatient Hospital Stay: Payer: Medicare Other | Admitting: Hematology

## 2023-08-19 ENCOUNTER — Encounter (HOSPITAL_COMMUNITY): Payer: Self-pay | Admitting: Orthopaedic Surgery

## 2023-08-19 MED ORDER — TRANEXAMIC ACID 1000 MG/10ML IV SOLN
2000.0000 mg | INTRAVENOUS | Status: DC
  Filled 2023-08-19: qty 20

## 2023-08-19 NOTE — Progress Notes (Signed)
Anesthesia Chart Review: Denise [name]  Case: [id number] Date/Time: 08/20/23 1117   Procedure: RIGHT INTRAMEDULLARY (IM) NAIL FEMORAL (Right)   Anesthesia type: General   Pre-op diagnosis: right femur lytic lesions   [facility]: [facility] ROOM 05 / [facility]   Surgeons: Tarry Kos, MD       DISCUSSION: Patient is a 27 female scheduled for the above procedure.  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  Last oncology visit with Dr. Trevor [name] was on 07/26/23. He referred her to ortho for prophylactic nailing of right femur to prevent future fractures. He also referred her to cardiology for palpitations when lying down at night. Echo from 07/22/23 showed LVEF 55-60%, mild MR. EKG showed NSR. She was scheduled for Enhertu infusion on 07/26/23, but she deferred given the upcoming holidays. Zoledronic acid given.   Anesthesia team to evaluate on the day of surgery.   VS:  Wt Readings from Last 3 Encounters:  08/07/23 72.6 kg  07/26/23 70.7 kg  07/17/23 68 kg   BP Readings from Last 3 Encounters:  08/07/23 125/78  07/26/23 111/66  07/17/23 129/85   Pulse Readings from Last 3 Encounters:  08/07/23 65  07/26/23 (!) 102  07/17/23 64     PROVIDERS: Toma Deiters, MD is PCP [name], MD is Unknown Jim, MD is RAD-ONC Elissa Hefty, MD is NEURO-ONC   LABS: Most recent labs in [facility] include: Lab Results  Component Value Date   WBC 4.5 08/07/2023   HGB 11.4 (L) 08/07/2023   HCT 34.2 (L) 08/07/2023   PLT 230 08/07/2023   GLUCOSE 121 (H) 08/07/2023   ALT 13 08/07/2023   AST 15 08/07/2023   NA 134 (L) 08/07/2023   K 4.1 08/07/2023   CL 103 08/07/2023   CREATININE 0.61 08/07/2023   BUN 13 08/07/2023   CO2 23  08/07/2023   TSH 1.988 09/28/2022     IMAGES: CXR 07/17/23: FINDINGS: Cardiac and mediastinal contours are unchanged. Stable position of right chest wall port. Lungs are clear. Unchanged pulmonary nodules of the left hemithorax, likely granulomas. No evidence of pleural effusion or pneumothorax. IMPRESSION: No active cardiopulmonary disease.   Bone Scan 06/29/23: IMPRESSION: No prior scintigraphic imaging was available for comparison at the time of dictation. Within this limitation, there is evidence of osseous metastatic disease in the axial and appendicular skeleton, including the frontal calvarium, thoracic spine, sacrum, bilateral iliac crests, the right acetabulum, right femur, and possibly the right proximal tibia.   MRI Brain 04/23/23: IMPRESSION: 1. New areas of subarachnoid based enhancement suggesting interval leptomeningeal disease, consider CSF cytology. 2. Pre-existing parenchymal deposits in the right frontal lobe are unchanged. 3. Modest enlargement of a right occipital bone metastasis.  MRI T/L-spine 12/05/22: IMPRESSION: 1. Osseous metastatic disease with new and progressive deposits since thoracic comparison by 05/28/2022 PET-CT and lumbar comparison by MRI 05/23/2022. 2. Lesions are seen at T2, right sixth rib, T8, T11, L2, S1, and S4. No acute fracture or extraosseous tumor extension.    EKG: 08/07/23: Sinus rhythm Abnormal R-wave progression, early transition Left ventricular hypertrophy No significant change since last tracing Confirmed by Jacalyn Lefevre [phone]) on 08/07/2023 9:25:41 AM   CV: Echo 07/22/23: IMPRESSIONS   1. Left ventricular ejection fraction, by estimation, is 55 to 60%. The  left ventricle has normal function. The left ventricle has no regional  wall motion abnormalities. There is mild left ventricular hypertrophy.  Left ventricular diastolic parameters  are consistent with Grade I diastolic dysfunction (impaired relaxation).   The average left ventricular global longitudinal strain is -16.2 %. The  global longitudinal strain is normal.   2. Right ventricular systolic function is normal. The right ventricular  size is normal. Tricuspid regurgitation signal is inadequate for assessing  PA pressure.   3. The mitral valve is normal in structure. Mild mitral valve  regurgitation. No evidence of mitral stenosis.   4. The tricuspid valve is abnormal.   5. The aortic valve was not well visualized. Aortic valve regurgitation  is not visualized. No aortic stenosis is present.   6. There is mild dilatation of the ascending aorta, measuring 37 mm.   7. The inferior vena cava is normal in size with greater than 50%  respiratory variability, suggesting right atrial pressure of 3 mmHg.    Past Medical History:  Diagnosis Date   Asthma    as child   Cancer (HCC) 12/2020   left breast [facility]   Complication of anesthesia    pateitn states,' I coded when I had my D&Cmany years ago.   Dyspnea    Family history of breast cancer    Hypertension    Hypothyroidism    PONV (postoperative nausea and vomiting)    Port-A-Cath in place 02/26/2021    Past Surgical History:  Procedure Laterality Date   ABDOMINAL HYSTERECTOMY     BIOPSY  01/17/2018   Procedure: BIOPSY;  Surgeon: Malissa Hippo, MD;  [facility];  Service: Endoscopy;;  duodenum,gastric   CHOLECYSTECTOMY     COLONOSCOPY WITH PROPOFOL N/A 01/17/2018   Procedure: COLONOSCOPY WITH PROPOFOL;  Surgeon: Malissa Hippo, MD;  [facility];  Service: Endoscopy;  Laterality: N/A;  7:30   DILATION AND CURETTAGE OF UTERUS     ESOPHAGOGASTRODUODENOSCOPY (EGD) WITH PROPOFOL N/A 01/17/2018   Procedure: ESOPHAGOGASTRODUODENOSCOPY (EGD) WITH PROPOFOL;  Surgeon: Malissa Hippo, MD;  [facility];  Service: Endoscopy;  Laterality: N/A;   POLYPECTOMY  01/17/2018   Procedure: POLYPECTOMY;  Surgeon: Malissa Hippo, MD;  [facility];   Service: Endoscopy;;  transverse colon, cecal   PORTACATH PLACEMENT Right 02/17/2021   Procedure: INSERTION PORT-A-CATH;  Surgeon: Abigail Miyamoto, MD;  Location: [facility];  Service: General;  Laterality: Right;   RADIOACTIVE SEED GUIDED AXILLARY SENTINEL LYMPH NODE Left 11/20/2021   Procedure: RADIOACTIVE SEED GUIDED LEFT AXILLARY SENTINEL LYMPH NODE DISSECTION;  Surgeon: Abigail Miyamoto, MD;  Location: [facility];  Service: General;  Laterality: Left;   TOTAL MASTECTOMY Left 11/20/2021   Procedure: LEFT TOTAL MASTECTOMY;  Surgeon: Abigail Miyamoto, MD;  Location: [facility];  Service: General;  Laterality: Left;    MEDICATIONS: No current facility-administered medications for this encounter.    acetaminophen (TYLENOL) 500 MG tablet   aspirin-acetaminophen-caffeine (EXCEDRIN MIGRAINE) 250-250-65 MG tablet   cetirizine (ZYRTEC) 10 MG tablet   dexamethasone (DECADRON) 4 MG tablet   gabapentin (NEURONTIN) 300 MG capsule   hydrALAZINE (APRESOLINE) 25 MG tablet   levothyroxine (SYNTHROID) 75 MCG tablet   lidocaine (LIDODERM) 5 %   lidocaine (XYLOCAINE) 2 % solution   Metamucil Fiber CHEW   metoCLOPramide (REGLAN) 10 MG tablet   olmesartan-hydrochlorothiazide (BENICAR HCT) 40-25 MG tablet   ondansetron (ZOFRAN) 4 MG tablet   pantoprazole (PROTONIX)  40 MG tablet   phenol (CHLORASEPTIC) 1.4 % LIQD   prochlorperazine (COMPAZINE) 10 MG tablet   promethazine (PHENERGAN) 25 MG suppository   sertraline (ZOLOFT) 25 MG tablet   sucralfate (CARAFATE) 1 g tablet   diphenhydrAMINE (BENADRYL) 25 MG tablet   morphine 10 MG/5ML solution    Shonna Chock, PA-C Surgical Short Stay/Anesthesiology [facility] Phone [phone] [facility] Phone [phone] 08/19/2023 12:12 PM

## 2023-08-19 NOTE — Anesthesia Preprocedure Evaluation (Addendum)
Anesthesia Evaluation  Patient identified by MRN, date of birth, ID band Patient awake    Reviewed: Allergy & Precautions, NPO status , Patient's Chart, lab work & pertinent test results  History of Anesthesia Complications (+) PONV and history of anesthetic complications (patient reports she coded during D&C)  Airway Mallampati: II  TM Distance: >3 FB Neck ROM: Full    Dental  (+) Edentulous Upper, Dental Advisory Given   Pulmonary neg shortness of breath, asthma (childhood) , neg sleep apnea, neg COPD, neg recent URI   Pulmonary exam normal breath sounds clear to auscultation       Cardiovascular hypertension, Pt. on medications (-) angina (-) Past MI, (-) Cardiac Stents and (-) CABG (-) dysrhythmias  Rhythm:Regular Rate:Normal  TTE 07/22/2023: IMPRESSIONS     1. Left ventricular ejection fraction, by estimation, is 55 to 60%. The  left ventricle has normal function. The left ventricle has no regional  wall motion abnormalities. There is mild left ventricular hypertrophy.  Left ventricular diastolic parameters  are consistent with Grade I diastolic dysfunction (impaired relaxation).  The average left ventricular global longitudinal strain is -16.2 %. The  global longitudinal strain is normal.   2. Right ventricular systolic function is normal. The right ventricular  size is normal. Tricuspid regurgitation signal is inadequate for assessing  PA pressure.   3. The mitral valve is normal in structure. Mild mitral valve  regurgitation. No evidence of mitral stenosis.   4. The tricuspid valve is abnormal.   5. The aortic valve was not well visualized. Aortic valve regurgitation  is not visualized. No aortic stenosis is present.   6. There is mild dilatation of the ascending aorta, measuring 37 mm.   7. The inferior vena cava is normal in size with greater than 50%  respiratory variability, suggesting right atrial pressure of 3  mmHg.     Neuro/Psych neg Seizures Leptomeningeal disease    GI/Hepatic Neg liver ROS,GERD  Medicated,,  Endo/Other  neg diabetesHypothyroidism    Renal/GU negative Renal ROS     Musculoskeletal   Abdominal   Peds  Hematology  (+) Blood dyscrasia, anemia Lab Results      Component                Value               Date                      WBC                      4.5                 08/07/2023                HGB                      11.4 (L)            08/07/2023                HCT                      34.2 (L)            08/07/2023                MCV  107.9 (H)           08/07/2023                PLT                      230                 08/07/2023              Anesthesia Other Findings Lytic bone lesion  Sore throat this morning. No other symptoms.  Reproductive/Obstetrics H/o left breast cancer                             Anesthesia Physical Anesthesia Plan  ASA: 3  Anesthesia Plan: General   Post-op Pain Management: Tylenol PO (pre-op)*   Induction: Intravenous  PONV Risk Score and Plan: 4 or greater and Ondansetron, Dexamethasone and Treatment may vary due to age or medical condition  Airway Management Planned: Oral ETT  Additional Equipment:   Intra-op Plan:   Post-operative Plan: Extubation in OR  Informed Consent: I have reviewed the patients History and Physical, chart, labs and discussed the procedure including the risks, benefits and alternatives for the proposed anesthesia with the patient or authorized representative who has indicated his/her understanding and acceptance.     Dental advisory given  Plan Discussed with: CRNA and Anesthesiologist  Anesthesia Plan Comments: (PAT note written 08/19/2023 by Shonna Chock, PA-C.  Risks of general anesthesia discussed including, but not limited to, sore throat, hoarse voice, chipped/damaged teeth, injury to vocal cords, nausea and vomiting,  allergic reactions, lung infection, heart attack, stroke, and death. All questions answered.  )       Anesthesia Quick Evaluation

## 2023-08-19 NOTE — Progress Notes (Signed)
SDW call  Patient was given pre-op instructions over the phone. Patient verbalized understanding of instructions provided.     PCP - Dr. Lia Hopping Hematology: Dr. [name] Cardiologist -  Pulmonary:    PPM/ICD - denies Device Orders - na Rep Notified - na   Chest x-ray - 07/17/2023 EKG -  08/09/2023 Stress Test - ECHO - 07/22/2023 Cardiac Cath -   Sleep Study/sleep apnea/CPAP: denies  Non-diabetic   Blood Thinner Instructions: denies Aspirin Instructions:denies  ERAS Protcol - Clears until 0830  COVID TEST- na    Anesthesia review: Yes. Undergoing active chemo   Patient denies shortness of breath, fever, cough and chest pain over the phone call  Your procedure is scheduled on Friday August 20, 2023  Report to [facility] Main Entrance "A" at 0900 A.M., then check in with the Admitting office.  Call this number if you have problems the morning of surgery:  [phone]   If you have any questions prior to your surgery date call [phone]: Open Monday-Friday 8am-4pm If you experience any cold or flu symptoms such as cough, fever, chills, shortness of breath, etc. between now and your scheduled surgery, please notify us at the above number    Remember:  Do not eat after midnight the night before your surgery  You may drink clear liquids until  0830   the morning of your surgery.   Clear liquids allowed are: Water, Non-Citrus Juices (without pulp), Carbonated Beverages, Clear Tea, Black Coffee ONLY (NO MILK, CREAM OR POWDERED CREAMER of any kind), and Gatorade   Take these medicines the morning of surgery with A SIP OF WATER:  Decadron, synthroid, protonix  As needed: Tylenol, zyrtec, hydralazine, reglan, zofran, carafate, phenergan  As of today, STOP taking any Aspirin (unless otherwise instructed by your surgeon) Aleve, Naproxen, Ibuprofen, Motrin, Advil, Goody's, BC's, all herbal medications, fish oil, and all vitamins.

## 2023-08-20 ENCOUNTER — Inpatient Hospital Stay (HOSPITAL_COMMUNITY): Payer: Medicare Other

## 2023-08-20 ENCOUNTER — Inpatient Hospital Stay (HOSPITAL_COMMUNITY): Payer: Medicare Other | Admitting: Vascular Surgery

## 2023-08-20 ENCOUNTER — Encounter (HOSPITAL_COMMUNITY): Payer: Self-pay | Admitting: Orthopaedic Surgery

## 2023-08-20 ENCOUNTER — Other Ambulatory Visit (HOSPITAL_COMMUNITY): Payer: Self-pay

## 2023-08-20 ENCOUNTER — Other Ambulatory Visit: Payer: Self-pay

## 2023-08-20 ENCOUNTER — Encounter: Payer: Self-pay | Admitting: Hematology

## 2023-08-20 ENCOUNTER — Encounter (HOSPITAL_COMMUNITY)
Admission: RE | Disposition: A | Discharge status: Rehabilitation Facility | Payer: Self-pay | Source: Home / Self Care | Attending: Orthopaedic Surgery

## 2023-08-20 ENCOUNTER — Encounter (HOSPITAL_COMMUNITY): Payer: Self-pay | Admitting: Hematology

## 2023-08-20 ENCOUNTER — Inpatient Hospital Stay (HOSPITAL_COMMUNITY)
Admission: RE | Admit: 2023-08-20 | Discharge: 2023-08-23 | DRG: 481 | Disposition: A | Discharge status: Rehabilitation Facility | Payer: Medicare Other | Attending: Orthopaedic Surgery | Admitting: Orthopaedic Surgery

## 2023-08-20 ENCOUNTER — Inpatient Hospital Stay: Payer: Medicare Other

## 2023-08-20 DIAGNOSIS — Z888 Allergy status to other drugs, medicaments and biological substances status: Secondary | ICD-10-CM | POA: Diagnosis not present

## 2023-08-20 DIAGNOSIS — Z91048 Other nonmedicinal substance allergy status: Secondary | ICD-10-CM | POA: Diagnosis not present

## 2023-08-20 DIAGNOSIS — Z853 Personal history of malignant neoplasm of breast: Secondary | ICD-10-CM | POA: Diagnosis not present

## 2023-08-20 DIAGNOSIS — D539 Nutritional anemia, unspecified: Secondary | ICD-10-CM | POA: Diagnosis not present

## 2023-08-20 DIAGNOSIS — Z96698 Presence of other orthopedic joint implants: Secondary | ICD-10-CM | POA: Diagnosis not present

## 2023-08-20 DIAGNOSIS — Z9889 Other specified postprocedural states: Secondary | ICD-10-CM | POA: Diagnosis not present

## 2023-08-20 DIAGNOSIS — E039 Hypothyroidism, unspecified: Secondary | ICD-10-CM | POA: Diagnosis present

## 2023-08-20 DIAGNOSIS — Z79890 Hormone replacement therapy (postmenopausal): Secondary | ICD-10-CM

## 2023-08-20 DIAGNOSIS — I82411 Acute embolism and thrombosis of right femoral vein: Secondary | ICD-10-CM | POA: Diagnosis not present

## 2023-08-20 DIAGNOSIS — W44F3XA Food entering into or through a natural orifice, initial encounter: Secondary | ICD-10-CM | POA: Diagnosis not present

## 2023-08-20 DIAGNOSIS — Z923 Personal history of irradiation: Secondary | ICD-10-CM | POA: Diagnosis not present

## 2023-08-20 DIAGNOSIS — Z79899 Other long term (current) drug therapy: Secondary | ICD-10-CM | POA: Diagnosis not present

## 2023-08-20 DIAGNOSIS — Z8249 Family history of ischemic heart disease and other diseases of the circulatory system: Secondary | ICD-10-CM

## 2023-08-20 DIAGNOSIS — C50919 Malignant neoplasm of unspecified site of unspecified female breast: Secondary | ICD-10-CM | POA: Diagnosis not present

## 2023-08-20 DIAGNOSIS — I10 Essential (primary) hypertension: Secondary | ICD-10-CM | POA: Diagnosis present

## 2023-08-20 DIAGNOSIS — Z8349 Family history of other endocrine, nutritional and metabolic diseases: Secondary | ICD-10-CM | POA: Diagnosis not present

## 2023-08-20 DIAGNOSIS — R5381 Other malaise: Secondary | ICD-10-CM | POA: Diagnosis not present

## 2023-08-20 DIAGNOSIS — Z515 Encounter for palliative care: Secondary | ICD-10-CM | POA: Diagnosis not present

## 2023-08-20 DIAGNOSIS — Z7189 Other specified counseling: Secondary | ICD-10-CM | POA: Diagnosis not present

## 2023-08-20 DIAGNOSIS — F54 Psychological and behavioral factors associated with disorders or diseases classified elsewhere: Secondary | ICD-10-CM | POA: Diagnosis not present

## 2023-08-20 DIAGNOSIS — Z90710 Acquired absence of both cervix and uterus: Secondary | ICD-10-CM | POA: Diagnosis not present

## 2023-08-20 DIAGNOSIS — I82441 Acute embolism and thrombosis of right tibial vein: Secondary | ICD-10-CM | POA: Diagnosis not present

## 2023-08-20 DIAGNOSIS — D62 Acute posthemorrhagic anemia: Secondary | ICD-10-CM | POA: Diagnosis not present

## 2023-08-20 DIAGNOSIS — Z91018 Allergy to other foods: Secondary | ICD-10-CM | POA: Diagnosis not present

## 2023-08-20 DIAGNOSIS — S72001A Fracture of unspecified part of neck of right femur, initial encounter for closed fracture: Secondary | ICD-10-CM | POA: Diagnosis not present

## 2023-08-20 DIAGNOSIS — Z9012 Acquired absence of left breast and nipple: Secondary | ICD-10-CM

## 2023-08-20 DIAGNOSIS — M84551A Pathological fracture in neoplastic disease, right femur, initial encounter for fracture: Secondary | ICD-10-CM

## 2023-08-20 DIAGNOSIS — I82451 Acute embolism and thrombosis of right peroneal vein: Secondary | ICD-10-CM | POA: Diagnosis not present

## 2023-08-20 DIAGNOSIS — Z803 Family history of malignant neoplasm of breast: Secondary | ICD-10-CM | POA: Diagnosis not present

## 2023-08-20 DIAGNOSIS — Y842 Radiological procedure and radiotherapy as the cause of abnormal reaction of the patient, or of later complication, without mention of misadventure at the time of the procedure: Secondary | ICD-10-CM | POA: Diagnosis present

## 2023-08-20 DIAGNOSIS — Z9049 Acquired absence of other specified parts of digestive tract: Secondary | ICD-10-CM | POA: Diagnosis not present

## 2023-08-20 DIAGNOSIS — R471 Dysarthria and anarthria: Secondary | ICD-10-CM | POA: Diagnosis not present

## 2023-08-20 DIAGNOSIS — Z801 Family history of malignant neoplasm of trachea, bronchus and lung: Secondary | ICD-10-CM | POA: Diagnosis not present

## 2023-08-20 DIAGNOSIS — C7951 Secondary malignant neoplasm of bone: Principal | ICD-10-CM | POA: Insufficient documentation

## 2023-08-20 DIAGNOSIS — I82431 Acute embolism and thrombosis of right popliteal vein: Secondary | ICD-10-CM | POA: Diagnosis not present

## 2023-08-20 DIAGNOSIS — T17928A Food in respiratory tract, part unspecified causing other injury, initial encounter: Secondary | ICD-10-CM | POA: Diagnosis not present

## 2023-08-20 DIAGNOSIS — Z408 Encounter for other prophylactic surgery: Secondary | ICD-10-CM | POA: Diagnosis not present

## 2023-08-20 DIAGNOSIS — R627 Adult failure to thrive: Secondary | ICD-10-CM | POA: Diagnosis not present

## 2023-08-20 DIAGNOSIS — C7931 Secondary malignant neoplasm of brain: Secondary | ICD-10-CM | POA: Diagnosis not present

## 2023-08-20 DIAGNOSIS — M25551 Pain in right hip: Principal | ICD-10-CM

## 2023-08-20 DIAGNOSIS — X58XXXA Exposure to other specified factors, initial encounter: Secondary | ICD-10-CM | POA: Diagnosis present

## 2023-08-20 DIAGNOSIS — M899 Disorder of bone, unspecified: Secondary | ICD-10-CM | POA: Diagnosis not present

## 2023-08-20 DIAGNOSIS — K5900 Constipation, unspecified: Secondary | ICD-10-CM | POA: Diagnosis not present

## 2023-08-20 DIAGNOSIS — E860 Dehydration: Secondary | ICD-10-CM | POA: Diagnosis not present

## 2023-08-20 DIAGNOSIS — Z66 Do not resuscitate: Secondary | ICD-10-CM | POA: Diagnosis not present

## 2023-08-20 DIAGNOSIS — I951 Orthostatic hypotension: Secondary | ICD-10-CM | POA: Diagnosis not present

## 2023-08-20 DIAGNOSIS — Z4789 Encounter for other orthopedic aftercare: Secondary | ICD-10-CM | POA: Diagnosis not present

## 2023-08-20 DIAGNOSIS — R1310 Dysphagia, unspecified: Secondary | ICD-10-CM | POA: Diagnosis not present

## 2023-08-20 HISTORY — PX: FEMUR IM NAIL: SHX1597

## 2023-08-20 LAB — BASIC METABOLIC PANEL
Anion gap: 11 (ref 5–15)
BUN: 25 mg/dL — ABNORMAL HIGH (ref 8–23)
CO2: 20 mmol/L — ABNORMAL LOW (ref 22–32)
Calcium: 8.6 mg/dL — ABNORMAL LOW (ref 8.9–10.3)
Chloride: 102 mmol/L (ref 98–111)
Creatinine, Ser: 0.77 mg/dL (ref 0.44–1.00)
GFR, Estimated: >60 mL/min (ref >60)
Glucose, Bld: 92 mg/dL (ref 70–99)
Potassium: 4.5 mmol/L (ref 3.5–5.1)
Sodium: 133 mmol/L — ABNORMAL LOW (ref 135–145)

## 2023-08-20 LAB — CBC
HCT: 39.6 % (ref 36.0–46.0)
Hemoglobin: 13.8 g/dL (ref 12.0–15.0)
MCH: 36 pg — ABNORMAL HIGH (ref 26.0–34.0)
MCHC: 34.8 g/dL (ref 30.0–36.0)
MCV: 103.4 fL — ABNORMAL HIGH (ref 80.0–100.0)
Platelets: 227 10*3/uL (ref 150–400)
RBC: 3.83 MIL/uL — ABNORMAL LOW (ref 3.87–5.11)
RDW: 13 % (ref 11.5–15.5)
WBC: 12.5 10*3/uL — ABNORMAL HIGH (ref 4.0–10.5)
nRBC: 0 % (ref 0.0–0.2)

## 2023-08-20 SURGERY — INSERTION, INTRAMEDULLARY ROD, FEMUR
Anesthesia: General | Laterality: Right

## 2023-08-20 MED ORDER — DEXAMETHASONE SODIUM PHOSPHATE 10 MG/ML IJ SOLN
INTRAMUSCULAR | Status: DC | PRN
  Administered 2023-08-20: 5 mg via INTRAVENOUS

## 2023-08-20 MED ORDER — KETOROLAC TROMETHAMINE 30 MG/ML IJ SOLN
INTRAMUSCULAR | Status: AC
  Filled 2023-08-20: qty 1

## 2023-08-20 MED ORDER — PHENYLEPHRINE HCL-NACL 20-0.9 MG/250ML-% IV SOLN
INTRAVENOUS | Status: DC | PRN
  Administered 2023-08-20: 40 ug/min via INTRAVENOUS

## 2023-08-20 MED ORDER — HYDROMORPHONE HCL 1 MG/ML IJ SOLN
0.5000 mg | INTRAMUSCULAR | Status: DC | PRN
  Administered 2023-08-20: 1 mg via INTRAVENOUS
  Filled 2023-08-20: qty 1

## 2023-08-20 MED ORDER — PROPOFOL 10 MG/ML IV BOLUS
INTRAVENOUS | Status: DC | PRN
  Administered 2023-08-20: 20 mg via INTRAVENOUS
  Administered 2023-08-20: 100 mg via INTRAVENOUS

## 2023-08-20 MED ORDER — FENTANYL CITRATE (PF) 250 MCG/5ML IJ SOLN
INTRAMUSCULAR | Status: DC | PRN
  Administered 2023-08-20 (×2): 50 ug via INTRAVENOUS

## 2023-08-20 MED ORDER — FENTANYL CITRATE (PF) 250 MCG/5ML IJ SOLN
INTRAMUSCULAR | Status: AC
  Filled 2023-08-20: qty 5

## 2023-08-20 MED ORDER — POLYETHYLENE GLYCOL 3350 17 G PO PACK: 17.0000 g | PACK | Freq: Every day | ORAL | Status: DC | PRN

## 2023-08-20 MED ORDER — PROPOFOL 500 MG/50ML IV EMUL
INTRAVENOUS | Status: DC | PRN
  Administered 2023-08-20: 125 ug/kg/min via INTRAVENOUS

## 2023-08-20 MED ORDER — ACETAMINOPHEN 500 MG PO TABS
1000.0000 mg | ORAL_TABLET | Freq: Once | ORAL | Status: DC
  Filled 2023-08-20: qty 2

## 2023-08-20 MED ORDER — 0.9 % SODIUM CHLORIDE (POUR BTL) OPTIME
TOPICAL | Status: DC | PRN
  Administered 2023-08-20: 1000 mL

## 2023-08-20 MED ORDER — ACETAMINOPHEN 10 MG/ML IV SOLN
INTRAVENOUS | Status: DC | PRN
  Administered 2023-08-20: 1000 mg via INTRAVENOUS

## 2023-08-20 MED ORDER — OXYCODONE-ACETAMINOPHEN 5-325 MG PO TABS
1.0000 | ORAL_TABLET | Freq: Two times a day (BID) | ORAL | 0 refills | Status: DC | PRN
Start: 2023-08-20 — End: 2023-08-22
  Filled 2023-08-20: qty 20, 5d supply, fill #0

## 2023-08-20 MED ORDER — DIPHENHYDRAMINE HCL 12.5 MG/5ML PO ELIX: 25.0000 mg | ORAL_SOLUTION | ORAL | Status: DC | PRN

## 2023-08-20 MED ORDER — HYDRALAZINE HCL 25 MG PO TABS: 25.0000 mg | ORAL_TABLET | Freq: Three times a day (TID) | ORAL | Status: DC | PRN

## 2023-08-20 MED ORDER — FENTANYL CITRATE (PF) 100 MCG/2ML IJ SOLN
INTRAMUSCULAR | Status: AC
  Filled 2023-08-20: qty 2

## 2023-08-20 MED ORDER — LIDOCAINE 2% (20 MG/ML) 5 ML SYRINGE
INTRAMUSCULAR | Status: AC
  Filled 2023-08-20: qty 5

## 2023-08-20 MED ORDER — ORAL CARE MOUTH RINSE: 15.0000 mL | Freq: Once | OROMUCOSAL | Status: AC

## 2023-08-20 MED ORDER — LACTATED RINGERS IV SOLN
INTRAVENOUS | Status: DC
Start: 2023-08-20 — End: 2023-08-20

## 2023-08-20 MED ORDER — GABAPENTIN 300 MG PO CAPS
300.0000 mg | ORAL_CAPSULE | Freq: Every day | ORAL | Status: DC
  Administered 2023-08-20 – 2023-08-22 (×3): 300 mg via ORAL
  Filled 2023-08-20 (×3): qty 1

## 2023-08-20 MED ORDER — ONDANSETRON HCL 4 MG/2ML IJ SOLN: 4.0000 mg | Freq: Four times a day (QID) | INTRAMUSCULAR | Status: DC | PRN

## 2023-08-20 MED ORDER — OXYCODONE HCL 5 MG PO TABS: 5.0000 mg | ORAL_TABLET | Freq: Once | ORAL | Status: DC | PRN

## 2023-08-20 MED ORDER — SORBITOL 70 % SOLN: 30.0000 mL | Freq: Every day | Status: DC | PRN

## 2023-08-20 MED ORDER — LIDOCAINE 2% (20 MG/ML) 5 ML SYRINGE
INTRAMUSCULAR | Status: DC | PRN
  Administered 2023-08-20: 100 mg via INTRAVENOUS

## 2023-08-20 MED ORDER — HYDROCHLOROTHIAZIDE 25 MG PO TABS: 25.0000 mg | ORAL_TABLET | Freq: Every day | ORAL | Status: DC | PRN

## 2023-08-20 MED ORDER — SUGAMMADEX SODIUM 200 MG/2ML IV SOLN
INTRAVENOUS | Status: DC | PRN
  Administered 2023-08-20: 200 mg via INTRAVENOUS

## 2023-08-20 MED ORDER — ROCURONIUM BROMIDE 10 MG/ML (PF) SYRINGE
PREFILLED_SYRINGE | INTRAVENOUS | Status: AC
Start: 2023-08-20 — End: ?
  Filled 2023-08-20: qty 10

## 2023-08-20 MED ORDER — CHLORHEXIDINE GLUCONATE 0.12 % MT SOLN: 15.0000 mL | Freq: Once | OROMUCOSAL | Status: AC

## 2023-08-20 MED ORDER — ACETAMINOPHEN 500 MG PO TABS
1000.0000 mg | ORAL_TABLET | Freq: Four times a day (QID) | ORAL | Status: AC
  Administered 2023-08-20 – 2023-08-21 (×4): 1000 mg via ORAL
  Filled 2023-08-20 (×4): qty 2

## 2023-08-20 MED ORDER — TRANEXAMIC ACID 1000 MG/10ML IV SOLN
INTRAVENOUS | Status: DC | PRN
  Administered 2023-08-20: 2000 mg via TOPICAL

## 2023-08-20 MED ORDER — ONDANSETRON HCL 4 MG PO TABS: 4.0000 mg | ORAL_TABLET | Freq: Four times a day (QID) | ORAL | Status: DC | PRN

## 2023-08-20 MED ORDER — ONDANSETRON HCL 4 MG/2ML IJ SOLN
INTRAMUSCULAR | Status: DC | PRN
  Administered 2023-08-20: 4 mg via INTRAVENOUS

## 2023-08-20 MED ORDER — PHENYLEPHRINE 80 MCG/ML (10ML) SYRINGE FOR IV PUSH (FOR BLOOD PRESSURE SUPPORT)
PREFILLED_SYRINGE | INTRAVENOUS | Status: DC | PRN
  Administered 2023-08-20: 80 ug via INTRAVENOUS
  Administered 2023-08-20: 160 ug via INTRAVENOUS
  Administered 2023-08-20 (×2): 80 ug via INTRAVENOUS

## 2023-08-20 MED ORDER — TRANEXAMIC ACID-NACL 1000-0.7 MG/100ML-% IV SOLN
1000.0000 mg | INTRAVENOUS | Status: AC
  Administered 2023-08-20: 1000 mg via INTRAVENOUS
  Filled 2023-08-20: qty 100

## 2023-08-20 MED ORDER — OXYCODONE HCL 5 MG PO TABS: 5.0000 mg | ORAL_TABLET | ORAL | Status: DC | PRN

## 2023-08-20 MED ORDER — TRANEXAMIC ACID-NACL 1000-0.7 MG/100ML-% IV SOLN
1000.0000 mg | Freq: Once | INTRAVENOUS | Status: AC
Start: 2023-08-20 — End: 2023-08-21
  Administered 2023-08-20: 1000 mg via INTRAVENOUS
  Filled 2023-08-20: qty 100

## 2023-08-20 MED ORDER — IRBESARTAN 300 MG PO TABS: 300.0000 mg | ORAL_TABLET | Freq: Every day | ORAL | Status: DC | PRN

## 2023-08-20 MED ORDER — CEFAZOLIN SODIUM-DEXTROSE 2-4 GM/100ML-% IV SOLN
2.0000 g | Freq: Four times a day (QID) | INTRAVENOUS | Status: AC
  Administered 2023-08-20 – 2023-08-21 (×3): 2 g via INTRAVENOUS
  Filled 2023-08-20 (×3): qty 100

## 2023-08-20 MED ORDER — METOCLOPRAMIDE HCL 5 MG/ML IJ SOLN: 5.0000 mg | Freq: Three times a day (TID) | INTRAMUSCULAR | Status: DC | PRN

## 2023-08-20 MED ORDER — AMISULPRIDE (ANTIEMETIC) 5 MG/2ML IV SOLN: 10.0000 mg | Freq: Once | INTRAVENOUS | Status: DC | PRN

## 2023-08-20 MED ORDER — PROPOFOL 10 MG/ML IV BOLUS
INTRAVENOUS | Status: AC
  Filled 2023-08-20: qty 20

## 2023-08-20 MED ORDER — MAGNESIUM CITRATE PO SOLN
1.0000 | Freq: Once | ORAL | Status: DC | PRN
Start: 2023-08-20 — End: 2023-08-23

## 2023-08-20 MED ORDER — LEVOTHYROXINE SODIUM 75 MCG PO TABS
75.0000 ug | ORAL_TABLET | Freq: Every day | ORAL | Status: DC
  Administered 2023-08-21 – 2023-08-23 (×3): 75 ug via ORAL
  Filled 2023-08-20 (×3): qty 1

## 2023-08-20 MED ORDER — OXYCODONE HCL 5 MG PO TABS
10.0000 mg | ORAL_TABLET | ORAL | Status: DC | PRN
  Administered 2023-08-21 – 2023-08-22 (×4): 15 mg via ORAL
  Filled 2023-08-20 (×5): qty 3

## 2023-08-20 MED ORDER — CEFAZOLIN SODIUM-DEXTROSE 2-4 GM/100ML-% IV SOLN
2.0000 g | INTRAVENOUS | Status: AC
  Administered 2023-08-20: 2 g via INTRAVENOUS
  Filled 2023-08-20: qty 100

## 2023-08-20 MED ORDER — METOCLOPRAMIDE HCL 5 MG PO TABS: 5.0000 mg | ORAL_TABLET | Freq: Three times a day (TID) | ORAL | Status: DC | PRN

## 2023-08-20 MED ORDER — ACETAMINOPHEN 325 MG PO TABS
325.0000 mg | ORAL_TABLET | Freq: Four times a day (QID) | ORAL | Status: DC | PRN
  Administered 2023-08-21 – 2023-08-23 (×3): 650 mg via ORAL
  Filled 2023-08-20 (×3): qty 2

## 2023-08-20 MED ORDER — METHOCARBAMOL 1000 MG/10ML IJ SOLN: 500.0000 mg | Freq: Four times a day (QID) | INTRAMUSCULAR | Status: DC | PRN

## 2023-08-20 MED ORDER — OXYCODONE HCL 5 MG/5ML PO SOLN: 5.0000 mg | Freq: Once | ORAL | Status: DC | PRN

## 2023-08-20 MED ORDER — DEXAMETHASONE SODIUM PHOSPHATE 10 MG/ML IJ SOLN
INTRAMUSCULAR | Status: AC
  Filled 2023-08-20: qty 1

## 2023-08-20 MED ORDER — ASPIRIN 81 MG PO CHEW
81.0000 mg | CHEWABLE_TABLET | Freq: Two times a day (BID) | ORAL | Status: DC
  Administered 2023-08-20 – 2023-08-23 (×6): 81 mg via ORAL
  Filled 2023-08-20 (×6): qty 1

## 2023-08-20 MED ORDER — OLMESARTAN MEDOXOMIL-HCTZ 40-25 MG PO TABS: 1.0000 | ORAL_TABLET | Freq: Every day | ORAL | Status: DC | PRN

## 2023-08-20 MED ORDER — FENTANYL CITRATE (PF) 100 MCG/2ML IJ SOLN
25.0000 ug | INTRAMUSCULAR | Status: DC | PRN
  Administered 2023-08-20 (×2): 50 ug via INTRAVENOUS

## 2023-08-20 MED ORDER — ONDANSETRON HCL 4 MG/2ML IJ SOLN
INTRAMUSCULAR | Status: AC
  Filled 2023-08-20: qty 2

## 2023-08-20 MED ORDER — CHLORHEXIDINE GLUCONATE 0.12 % MT SOLN
OROMUCOSAL | Status: AC
  Administered 2023-08-20: 15 mL via OROMUCOSAL
  Filled 2023-08-20: qty 15

## 2023-08-20 MED ORDER — DOCUSATE SODIUM 100 MG PO CAPS
100.0000 mg | ORAL_CAPSULE | Freq: Two times a day (BID) | ORAL | Status: DC
  Administered 2023-08-20 – 2023-08-23 (×6): 100 mg via ORAL
  Filled 2023-08-20 (×6): qty 1

## 2023-08-20 MED ORDER — ROCURONIUM BROMIDE 10 MG/ML (PF) SYRINGE
PREFILLED_SYRINGE | INTRAVENOUS | Status: DC | PRN
  Administered 2023-08-20: 50 mg via INTRAVENOUS

## 2023-08-20 MED ORDER — METHOCARBAMOL 500 MG PO TABS
500.0000 mg | ORAL_TABLET | Freq: Four times a day (QID) | ORAL | Status: DC | PRN
  Administered 2023-08-20 – 2023-08-23 (×4): 500 mg via ORAL
  Filled 2023-08-20 (×5): qty 1

## 2023-08-20 SURGICAL SUPPLY — 42 items
BAG COUNTER SPONGE SURGICOUNT (BAG) ×1 IMPLANT
BAG DECANTER FOR FLEXI CONT (MISCELLANEOUS) IMPLANT
BIT DRILL INTERTAN LAG SCREW (BIT) IMPLANT
BIT DRILL SHORT 4.0 (BIT) IMPLANT
BNDG COHESIVE 4X5 TAN STRL (GAUZE/BANDAGES/DRESSINGS) ×1 IMPLANT
COVER PERINEAL POST (MISCELLANEOUS) ×1 IMPLANT
COVER SURGICAL LIGHT HANDLE (MISCELLANEOUS) ×1 IMPLANT
DRAPE C-ARMOR (DRAPES) ×1 IMPLANT
DRAPE HALF SHEET 40X57 (DRAPES) IMPLANT
DRAPE INCISE IOBAN 66X45 STRL (DRAPES) IMPLANT
DRAPE SURG ORHT 6 SPLT 77X108 (DRAPES) IMPLANT
DRESSING MEPILEX FLEX 4X4 (GAUZE/BANDAGES/DRESSINGS) ×3 IMPLANT
DRSG AQUACEL AG ADV 3.5X 4 (GAUZE/BANDAGES/DRESSINGS) IMPLANT
DRSG MEPILEX FLEX 4X4 (GAUZE/BANDAGES/DRESSINGS) ×3
DURAPREP 26ML APPLICATOR (WOUND CARE) ×1 IMPLANT
ELECT REM PT RETURN 9FT ADLT (ELECTROSURGICAL)
ELECTRODE REM PT RTRN 9FT ADLT (ELECTROSURGICAL) IMPLANT
FACESHIELD WRAPAROUND (MASK) ×2
FACESHIELD WRAPAROUND OR TEAM (MASK) ×2 IMPLANT
GLOVE BIOGEL PI IND STRL 7.0 (GLOVE) ×2 IMPLANT
GLOVE BIOGEL PI IND STRL 7.5 (GLOVE) ×2 IMPLANT
GLOVE ECLIPSE 7.0 STRL STRAW (GLOVE) ×1 IMPLANT
GLOVE SKINSENSE STRL SZ7.5 (GLOVE) ×1 IMPLANT
GLOVE SURG SYN 7.5 E (GLOVE) ×2
GLOVE SURG SYN 7.5 PF PI (GLOVE) ×2 IMPLANT
GLOVE SURG UNDER POLY LF SZ7 (GLOVE) ×19 IMPLANT
GLOVE SURG UNDER POLY LF SZ7.5 (GLOVE) ×2 IMPLANT
GOWN STRL SURGICAL XL XLNG (GOWN DISPOSABLE) ×1 IMPLANT
GUIDE PIN 3.2X343 (PIN) ×2
KIT BASIN OR (CUSTOM PROCEDURE TRAY) ×1 IMPLANT
KIT TURNOVER KIT B (KITS) ×1 IMPLANT
MANIFOLD NEPTUNE II (INSTRUMENTS) ×1 IMPLANT
NAIL TRIGEN 10MMX36CM-125 RT (Nail) IMPLANT
NS IRRIG 1000ML POUR BTL (IV SOLUTION) ×1 IMPLANT
PACK GENERAL/GYN (CUSTOM PROCEDURE TRAY) ×1 IMPLANT
PAD ARMBOARD 7.5X6 YLW CONV (MISCELLANEOUS) ×2 IMPLANT
PIN GUIDE 3.2X343MM (PIN) IMPLANT
SCREW LAG COMPR KIT 85/80 (Screw) IMPLANT
STAPLER VISISTAT 35W (STAPLE) ×1 IMPLANT
SUT VIC AB 0 CT1 27XBRD ANBCTR (SUTURE) ×1 IMPLANT
SUT VIC AB 2-0 CT1 TAPERPNT 27 (SUTURE) ×1 IMPLANT
WATER STERILE IRR 1000ML POUR (IV SOLUTION) ×2 IMPLANT

## 2023-08-20 NOTE — H&P (Signed)
PREOPERATIVE H&P  Chief Complaint: right femur lytic lesions  HPI: Denise [name] is a 72 y.o. female who presents for surgical treatment of right femur lytic lesions.  She denies any changes in medical history.  Past Surgical History:  Procedure Laterality Date   ABDOMINAL HYSTERECTOMY     BIOPSY  01/17/2018   Procedure: BIOPSY;  Surgeon: Malissa Hippo, MD;  [facility];  Service: Endoscopy;;  duodenum,gastric   CHOLECYSTECTOMY     COLONOSCOPY WITH PROPOFOL N/A 01/17/2018   Procedure: COLONOSCOPY WITH PROPOFOL;  Surgeon: Malissa Hippo, MD;  [facility];  Service: Endoscopy;  Laterality: N/A;  7:30   DILATION AND CURETTAGE OF UTERUS     ESOPHAGOGASTRODUODENOSCOPY (EGD) WITH PROPOFOL N/A 01/17/2018   Procedure: ESOPHAGOGASTRODUODENOSCOPY (EGD) WITH PROPOFOL;  Surgeon: Malissa Hippo, MD;  [facility];  Service: Endoscopy;  Laterality: N/A;   POLYPECTOMY  01/17/2018   Procedure: POLYPECTOMY;  Surgeon: Malissa Hippo, MD;  [facility];  Service: Endoscopy;;  transverse colon, cecal   PORTACATH PLACEMENT Right 02/17/2021   Procedure: INSERTION PORT-A-CATH;  Surgeon: Abigail Miyamoto, MD;  Location: [facility];  Service: General;  Laterality: Right;   RADIOACTIVE SEED GUIDED AXILLARY SENTINEL LYMPH NODE Left 11/20/2021   Procedure: RADIOACTIVE SEED GUIDED LEFT AXILLARY SENTINEL LYMPH NODE DISSECTION;  Surgeon: Abigail Miyamoto, MD;  Location: [facility];  Service: General;  Laterality: Left;   TOTAL MASTECTOMY Left 11/20/2021   Procedure: LEFT TOTAL MASTECTOMY;  Surgeon: Abigail Miyamoto, MD;  Location:  SURGERY CENTER;  Service: General;  Laterality: Left;   Social History   Socioeconomic History   Marital status: Widowed    Spouse name: Not on file   Number of children: 3   Years of education: Not on file   Highest education level: Not on file  Occupational History   Occupation: [employer]   Occupation: retired  Tobacco Use   Smoking status: Never   Smokeless tobacco: Never  Vaping Use   Vaping status: Never Used  Substance and Sexual Activity   Alcohol use: Never   Drug use: Never   Sexual activity: Not Currently    Birth control/protection: Surgical  Other Topics Concern   Not on file  Social History Narrative   Not on file   Social Determinants of Health   Financial Resource Strain: Low Risk  (07/10/2022)   Overall Financial Resource Strain (CARDIA)    Difficulty of Paying Living Expenses: Not hard at all  Food Insecurity: No Food Insecurity (06/30/2022)   Hunger Vital Sign    Worried About Running Out of Food in the Last Year: Never true    Ran Out of Food in the Last Year: Never true  Transportation Needs: No Transportation Needs (07/10/2022)   PRAPARE - Administrator, Civil Service (Medical): No    Lack of Transportation (Non-Medical): No  Physical Activity: Insufficiently Active (01/22/2021)   Exercise Vital Sign    Days of Exercise per Week: 3 days    Minutes of Exercise per Session: 30 min  Stress: No Stress Concern Present (01/22/2021)   [employer] of Occupational Health - Occupational Stress Questionnaire    Feeling of Stress : Not at all  Social Connections: Moderately Integrated (01/22/2021)   Social Connection and Isolation Panel [NHANES]    Frequency of Communication with Friends and Family: More than three times a week    Frequency  of Social Gatherings with Friends and Family: More than three times a week    Attends Religious Services: More than 4 times per year    Active Member of Clubs or Organizations: No    Attends Engineer, structural: More than 4 times per year    Marital Status: Widowed   Family History  Problem Relation Age of Onset   Breast cancer Mother        dx in her 45s   Heart disease Mother    Thyroid disease Mother    Lung cancer Father        dx in his 36s   Thyroid  disease Maternal Aunt    Thyroid nodules Maternal Grandmother 35       goiter   Thyroid disease Maternal Grandmother    Heart disease Maternal Grandfather    Heart disease Paternal Grandmother    Heart disease Paternal Grandfather    Thyroid disease Daughter    Thyroid disease Daughter    Cancer Maternal Uncle        NOS   Cancer Paternal Uncle        NOS   Breast cancer Cousin        pat first cousin died in her 65s;    Allergies  Allergen Reactions   Exforge [Amlodipine Besylate-Valsartan] Nausea And Vomiting   Gadolinium Derivatives Anaphylaxis, Hives and Itching    Pt should not have MRI contrast in outpt facility. Pt developed hives and itching AFTER taking 13 hr prep. Per Dr. Charise Killian. 04/23/23   Gadopiclenol Hives and Itching    13 hr prep prior to contrast admin    Tape Other (See Comments)    Redness and Irritation   Cheese     Hard cheese   Levofloxacin In D5w Hives   Other    Proanthocyanidin Other (See Comments)   Strawberry Extract Diarrhea    Seeds,nuts, lettuce, grapes   Wild Lettuce Extract (Lactuca Virosa)    Yeast-Derived Drug Products Hives    Mold on bread   Prior to Admission medications   Medication Sig Start Date End Date Taking? Authorizing Provider  acetaminophen (TYLENOL) 500 MG tablet Take 500-1,000 mg by mouth every 6 (six) hours as needed (pain.).   Yes [provider]  aspirin-acetaminophen-caffeine (EXCEDRIN MIGRAINE) [phone] MG tablet Take 1-2 tablets by mouth every 8 (eight) hours as needed for headache or migraine.   Yes [provider]  cetirizine (ZYRTEC) 10 MG tablet Take 10 mg by mouth daily as needed for allergies.   Yes [provider]  dexamethasone (DECADRON) 4 MG tablet TAKE 1 TABLET BY MOUTH TWICE DAILY WITH MEALS 08/09/23  Yes Ronny Bacon, PA-C  gabapentin (NEURONTIN) 300 MG capsule Take 300 mg by mouth at bedtime. 11/05/20  Yes [provider]  hydrALAZINE (APRESOLINE) 25 MG tablet  Take 25 mg by mouth 3 (three) times daily as needed (if systolic bp is greater than 180 or diastolic blood pressure greater than 90). 07/16/22  Yes [provider]  levothyroxine (SYNTHROID) 75 MCG tablet Take 75 mcg by mouth daily before breakfast.   Yes [provider]  lidocaine (LIDODERM) 5 % Place 1 patch onto the skin daily as needed (pain.). Remove & Discard patch within 12 hours or as directed by MD   Yes [provider]  lidocaine (XYLOCAINE) 2 % solution Use as directed 15 mLs in the mouth or throat as needed (reflux). 03/24/23  Yes Idol, [name], PA-C  Metamucil  Fiber CHEW Chew 1 tablet by mouth daily as needed (constipation.).   Yes [provider]  metoCLOPramide (REGLAN) 10 MG tablet Take 1 tablet (10 mg total) by mouth every 6 (six) hours as needed for nausea or vomiting. 03/24/23  Yes Idol, [name], PA-C  olmesartan-hydrochlorothiazide (BENICAR HCT) 40-25 MG tablet Take 1 tablet by mouth daily as needed (elevated bp). 07/16/22  Yes [provider]  ondansetron (ZOFRAN) 4 MG tablet Take 1 tablet (4 mg total) by mouth every 8 (eight) hours as needed for nausea or vomiting. 07/05/23  Yes [name], MD  pantoprazole (PROTONIX) 40 MG tablet Take 1 tablet (40 mg total) by mouth 2 (two) times daily before a meal. 08/07/23  Yes Lockie Mola, MD  phenol (CHLORASEPTIC) 1.4 % LIQD Use as directed 1 spray in the mouth or throat as needed for throat irritation / pain.   Yes [provider]  prochlorperazine (COMPAZINE) 10 MG tablet Take 1 tablet (10 mg total) by mouth 2 (two) times daily as needed for nausea or vomiting. Patient taking differently: Take 10 mg by mouth at bedtime. 08/07/23  Yes Lockie Mola, MD  promethazine (PHENERGAN) 25 MG suppository Place 1 suppository (25 mg total) rectally every 6 (six) hours as needed for nausea or vomiting. Patient taking differently: Place 25 mg rectally daily as needed for nausea or vomiting.  03/28/23  Yes Bethann Berkshire, MD  sertraline (ZOLOFT) 25 MG tablet Take 1 tablet (25 mg total) by mouth daily. Patient taking differently: Take 25 mg by mouth at bedtime. 07/26/23  Yes [name], MD  sucralfate (CARAFATE) 1 g tablet Take 1 tablet (1 g total) by mouth every 4 (four) hours as needed. Patient taking differently: Take 1 g by mouth 2 (two) times daily as needed (indigestion/heartburn.). 03/30/23  Yes [name], MD  diphenhydrAMINE (BENADRYL) 25 MG tablet Take 1 tablet (25 mg total) by mouth every 6 (six) hours. Patient not taking: Reported on 08/17/2023 08/07/23   Lockie Mola, MD  morphine 10 MG/5ML solution Take 10 mg by mouth every 4 (four) hours as needed. 07/22/23   [provider]     Positive ROS: All other systems have been reviewed and were otherwise negative with the exception of those mentioned in the HPI and as above.  Physical Exam: General: Alert, no acute distress Cardiovascular: No pedal edema Respiratory: No cyanosis, no use of accessory musculature GI: abdomen soft Skin: No lesions in the area of chief complaint Neurologic: Sensation intact distally Psychiatric: Patient is competent for consent with normal mood and affect Lymphatic: no lymphedema  MUSCULOSKELETAL: exam stable  Assessment: right femur lytic lesions  Plan: Plan for Procedure(s): RIGHT INTRAMEDULLARY (IM) NAIL FEMORAL  The risks benefits and alternatives were discussed with the patient including but not limited to the risks of nonoperative treatment, versus surgical intervention including infection, bleeding, nerve injury,  blood clots, cardiopulmonary complications, morbidity, mortality, among others, and they were willing to proceed.   Glee Arvin, MD 08/20/2023 9:54 AM

## 2023-08-20 NOTE — Op Note (Signed)
   Date of Surgery: 08/20/2023  INDICATIONS: Denise [name] is a 72 y.o.-year-old female who has metastatic breast cancer of the right femur with impending pathologic fracture. The risks and benefits of the procedure discussed with the patient prior to the procedure and all questions were answered; consent was obtained.  PREOPERATIVE DIAGNOSIS: Impending pathologic right femur fracture due to metastatic breast cancer  POSTOPERATIVE DIAGNOSIS: Same   PROCEDURE:  Prophylactic intramedullary nail of right femur  SURGEON: N. Glee Arvin, M.D.   ASSIST: None  ANESTHESIA: general   IV FLUIDS AND URINE: See anesthesia record   ESTIMATED BLOOD LOSS: 100 cc  IMPLANTS: [employer] InterTAN 10 x 36 cm, 85/80 lag screws  DRAINS: None.   COMPLICATIONS: see description of procedure.   DESCRIPTION OF PROCEDURE: The patient was brought to the operating room and placed supine on the operating table. The patient's leg had been signed prior to the procedure. The patient had the anesthesia placed by the anesthesiologist. The prep verification and incision time-outs were performed to confirm that this was the correct patient, site, side and location. The patient had an SCD on the opposite lower extremity. The patient did receive antibiotics prior to the incision and was re-dosed during the procedure as needed at indicated intervals. The patient was positioned on the HANA table. The well leg was placed in a scissor position and all bony prominences were well-padded. The patient had the lower extremity prepped and draped in the standard surgical fashion. The incision was made 4 finger breadths superior to the greater trochanter. A guide pin was inserted into the tip of the greater trochanter under fluoroscopic guidance. An opening reamer was used to gain access to the femoral canal. The nail length was measured and inserted down the femoral canal to its proper depth. The appropriate version of insertion for the  lag screw was found under fluoroscopy. A pin was inserted up the femoral neck through the jig.  The length of the lag screw was then measured. The lag screw was inserted as near to center-center in the head as possible. An interdigitating compression screw was placed in its place.  The wounds were copiously irrigated with saline and the subcutaneous layer closed with 2.0 vicryl and the skin was reapproximated with staples. The wounds were cleaned and dried a final time and a sterile dressing was placed. The hip was taken through a range of motion at the end of the case under fluoroscopic imaging to visualize the approach-withdraw phenomenon and confirm implant length in the head. The patient was then awakened from anesthesia and taken to the recovery room in stable condition. All counts were correct at the end of the case.   POSTOPERATIVE PLAN: The patient will be weight bearing as tolerated and will return in 2 weeks for staple removal and the patient will receive DVT prophylaxis based on other medications, activity level, and risk ratio of bleeding to thrombosis.   Denise Reel, MD [facility] 12:40 PM

## 2023-08-20 NOTE — Anesthesia Procedure Notes (Addendum)
Procedure Name: Intubation Date/Time: 08/20/2023 11:36 AM  Performed by: Yolonda Kida, CRNAPre-anesthesia Checklist: Patient identified, Emergency Drugs available, Suction available and Patient being monitored Patient Re-evaluated:Patient Re-evaluated prior to induction Oxygen Delivery Method: Circle System Utilized Preoxygenation: Pre-oxygenation with 100% oxygen Induction Type: IV induction Ventilation: Mask ventilation without difficulty and Oral airway inserted - appropriate to patient size Laryngoscope Size: Mac and 3 Grade View: Grade I Tube type: Oral Tube size: 7.0 mm Number of attempts: 1 Airway Equipment and Method: Stylet Placement Confirmation: ETT inserted through vocal cords under direct vision, positive ETCO2 and breath sounds checked- equal and bilateral Secured at: 22 cm Tube secured with: Tape Dental Injury: Teeth and Oropharynx as per pre-operative assessment  Comments: Airway by srna

## 2023-08-20 NOTE — Discharge Instructions (Signed)
Postoperative instructions:  Weightbearing instructions: as tolerated  Dressing instructions: Keep your dressing and/or splint clean and dry at all times.  It will be removed at your first post-operative appointment.  Your stitches and/or staples will be removed at this visit.  Incision instructions:  Do not soak your incision for 3 weeks after surgery.  If the incision gets wet, pat dry and do not scrub the incision.  Pain control:  You have been given a prescription to be taken as directed for post-operative pain control.  In addition, elevate the operative extremity above the heart at all times to prevent swelling and throbbing pain.  Take over-the-counter Colace, 100mg  by mouth twice a day while taking narcotic pain medications to help prevent constipation.  Follow up appointments: 1) 14 days for suture removal and wound check. 2) Dr. Roda Shutters as scheduled.   -------------------------------------------------------------------------------------------------------------  After Surgery Pain Control:  After your surgery, post-surgical discomfort or pain is likely. This discomfort can last several days to a few weeks. At certain times of the day your discomfort may be more intense.  Did you receive a nerve block?  A nerve block can provide pain relief for one hour to two days after your surgery. As long as the nerve block is working, you will experience little or no sensation in the area the surgeon operated on.  As the nerve block wears off, you will begin to experience pain or discomfort. It is very important that you begin taking your prescribed pain medication before the nerve block fully wears off. Treating your pain at the first sign of the block wearing off will ensure your pain is better controlled and more tolerable when full-sensation returns. Do not wait until the pain is intolerable, as the medicine will be less effective. It is better to treat pain in advance than to try and catch up.   General Anesthesia:  If you did not receive a nerve block during your surgery, you will need to start taking your pain medication shortly after your surgery and should continue to do so as prescribed by your surgeon.  Pain Medication:  Most commonly we prescribe Vicodin and Percocet for post-operative pain. Both of these medications contain a combination of acetaminophen (Tylenol) and a narcotic to help control pain.   It takes between 30 and 45 minutes before pain medication starts to work. It is important to take your medication before your pain level gets too intense.   Nausea is a common side effect of many pain medications. You will want to eat something before taking your pain medicine to help prevent nausea.   If you are taking a prescription pain medication that contains acetaminophen, we recommend that you do not take additional over the counter acetaminophen (Tylenol).  Other pain relieving options:   Using a cold pack to ice the affected area a few times a day (15 to 20 minutes at a time) can help to relieve pain, reduce swelling and bruising.   Elevation of the affected area can also help to reduce pain and swelling.  Per [facility] clinic policy, our goal is ensure optimal postoperative pain control with a multimodal pain management strategy. For all [facility] patients, our goal is to wean post-operative narcotic medications by 6 weeks post-operatively. If this is not possible due to utilization of pain medication prior to surgery, your [facility] doctor will support your acute post-operative pain control for the first 6 weeks postoperatively, with a plan to transition you back to your primary  pain team following that. Cyndia Skeeters will work to ensure a Therapist, occupational.

## 2023-08-20 NOTE — Anesthesia Postprocedure Evaluation (Signed)
Anesthesia Post Note  Patient: Denise [name]  Procedure(s) Performed: RIGHT INTRAMEDULLARY (IM) NAIL FEMORAL (Right)     Patient location during evaluation: PACU Anesthesia Type: General Level of consciousness: awake Pain management: pain level controlled Vital Signs Assessment: post-procedure vital signs reviewed and stable Respiratory status: spontaneous breathing, nonlabored ventilation and respiratory function stable Cardiovascular status: blood pressure returned to baseline and stable Postop Assessment: no apparent nausea or vomiting Anesthetic complications: no   No notable events documented.  Last Vitals:  Vitals:   08/20/23 1400 08/20/23 1415  BP: 121/73 114/71  Pulse: (!) 52 (!) 58  Resp: 15 11  Temp:    SpO2: 99% 98%    Last Pain:  Vitals:   08/20/23 1415  TempSrc:   PainSc: 5                  Linton Rump

## 2023-08-20 NOTE — Transfer of Care (Signed)
Immediate Anesthesia Transfer of Care Note  Patient: Denise [name]  Procedure(s) Performed: RIGHT INTRAMEDULLARY (IM) NAIL FEMORAL (Right)  Patient Location: PACU  Anesthesia Type:General  Level of Consciousness: drowsy and patient cooperative  Airway & Oxygen Therapy: Patient Spontanous Breathing and Patient connected to face mask oxygen  Post-op Assessment: Report given to RN and Post -op Vital signs reviewed and stable  Post vital signs: Reviewed and stable  Last Vitals:  Vitals Value Taken Time  BP 104/76 08/20/23 1245  Temp    Pulse 60 08/20/23 1246  Resp 22 08/20/23 1246  SpO2 100 % 08/20/23 1246  Vitals shown include unfiled device data.  Last Pain:  Vitals:   08/20/23 1011  TempSrc:   PainSc: 0-No pain         Complications: No notable events documented.

## 2023-08-21 MED ORDER — SODIUM CHLORIDE 0.9 % IV SOLN: Freq: Once | INTRAVENOUS | Status: AC

## 2023-08-21 MED ORDER — SODIUM CHLORIDE 0.9 % IV SOLN: Freq: Once | INTRAVENOUS | Status: DC

## 2023-08-21 MED ORDER — SODIUM CHLORIDE 0.9 % IV SOLN: INTRAVENOUS | Status: AC

## 2023-08-21 NOTE — Progress Notes (Signed)
   Subjective:  Patient reports mainly muscle spasms.  Objective:   VITALS:   Vitals:   08/20/23 1645 08/20/23 1711 08/20/23 2019 08/21/23 0512  BP: 100/74 106/84 111/77 95/61  Pulse: 80 81 63 62  Resp: 18 17 17 17   Temp: 97.8 F (36.6 C) 98.7 F (37.1 C) 98.1 F (36.7 C) 98.2 F (36.8 C)  TempSrc:  Oral Oral Oral  SpO2: 97% 100% 100% 98%  Weight:      Height:        Sensation intact distally Intact pulses distally Dorsiflexion/Plantar flexion intact Incision: dressing C/D/I and no drainage   Lab Results  Component Value Date   WBC 12.5 (H) 08/20/2023   HGB 13.8 08/20/2023   HCT 39.6 08/20/2023   MCV 103.4 (H) 08/20/2023   PLT 227 08/20/2023     Assessment/Plan:  1 Day Post-Op   - Expected postop acute blood loss anemia - Up with PT/OT - DVT ppx - SCDs, ambulation, aspirin - WBAT operative extremity - Pain control - Discharge planning - d/c home when she clears PT  Glee Arvin 08/21/2023, 8:03 AM

## 2023-08-21 NOTE — Evaluation (Signed)
Physical Therapy Evaluation Patient Details Name: Denise [name] MRN: [id number] DOB: 12/19/1950 Today's Date: 08/21/2023  History of Present Illness  Pt is a 72 y.o. F who presents 08/20/2023 for surgical treatment of right femur lytic lesions now s/p prophylactic intramedullary nail of right femur. Significant PMH: metastatic breast cancer.  Clinical Impression  Pt s/p procedure listed above. PTA, pt works part time as a Runner, broadcasting/film/video at [employer] and is independent. Pt presents with decreased functional mobility secondary to RLE weakness, impaired standing balance, pain, gait abnormalities. Pt requiring min-mod assist for functional mobility. Able to transfer and ambulate a short distance today to and from bedside commode with rolling walker. Pt reports RLE muscle spasms and RN present to provide Robaxin. Repositioned pt for comfort in the chair. Pt has intermittent support from her daughter, but not consistent assist upon d/c home. She has a strong desire to return to independence, and she would be an excellent candidate for acute intensive rehabilitation > 3 hours/day to progress to modI level. Suspect good progress based on PLOF.         If plan is discharge home, recommend the following: A little help with walking and/or transfers;A little help with bathing/dressing/bathroom;Assistance with cooking/housework;Assist for transportation;Help with stairs or ramp for entrance   Can travel by private vehicle        Equipment Recommendations BSC/3in1  Recommendations for Other Services  Rehab consult;OT consult    Functional Status Assessment Patient has had a recent decline in their functional status and demonstrates the ability to make significant improvements in function in a reasonable and predictable amount of time.     Precautions / Restrictions Precautions Precautions: Fall Restrictions Weight Bearing Restrictions: Yes RLE Weight Bearing: Weight bearing as tolerated       Mobility  Bed Mobility Overal bed mobility: Needs Assistance Bed Mobility: Supine to Sit     Supine to sit: Min assist     General bed mobility comments: Cues for technique, use of bed rail, assits for RLE negotiation out of the bed and utilization of bed pad to scoot hips out to edge    Transfers Overall transfer level: Needs assistance Equipment used: Rolling walker (2 wheels) Transfers: Sit to/from Stand, Bed to chair/wheelchair/BSC Sit to Stand: Min assist, Mod assist Stand pivot transfers: Min assist         General transfer comment: MinA from edge of bed and pivoting towards left to [facility]. ModA from bedside commode to power up to standing and initially steady.    Ambulation/Gait Ambulation/Gait assistance: Min assist Gait Distance (Feet): 3 Feet Assistive device: Rolling walker (2 wheels) Gait Pattern/deviations: Step-to pattern, Decreased stance time - right, Decreased weight shift to right Gait velocity: decreased Gait velocity interpretation: <1.8 ft/sec, indicate of risk for recurrent falls   General Gait Details: Verbal cues for sequencing/technique, ambulating from [facility] to chair. Cues for walker use and intermittent steering required. Reminders to back up to chair and BSC prior to sitting pre-emptively  Stairs            Wheelchair Mobility     Tilt Bed    Modified Rankin (Stroke Patients Only)       Balance Overall balance assessment: Needs assistance Sitting-balance support: Feet supported Sitting balance-Leahy Scale: Fair     Standing balance support: Bilateral upper extremity supported Standing balance-Leahy Scale: Poor  Pertinent Vitals/Pain Pain Assessment Pain Assessment: Faces Faces Pain Scale: Hurts even more Pain Location: RLE Pain Descriptors / Indicators: Operative site guarding, Spasm Pain Intervention(s): Limited activity within patient's tolerance, Monitored during session,  Premedicated before session    Home Living Family/patient expects to be discharged to:: Private residence Living Arrangements: Alone Available Help at Discharge: Family (daughter) Type of Home: House Home Access: Level entry       Home Layout: One level Home Equipment: Educational psychologist (4 wheels);Rolling Walker (2 wheels);Cane - single point      Prior Function Prior Level of Function : Independent/Modified Independent             Mobility 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       Extremity/Trunk Assessment   Upper Extremity Assessment Upper Extremity Assessment: Overall WFL for tasks assessed    Lower Extremity Assessment Lower Extremity Assessment: RLE deficits/detail RLE Deficits / Details: Able to perform LAQ with mild lag, limited hip flexion    Cervical / Trunk Assessment Cervical / Trunk Assessment: Normal  Communication   Communication Communication: No apparent difficulties  Cognition Arousal: Alert Behavior During Therapy: WFL for tasks assessed/performed Overall Cognitive Status: Within Functional Limits for tasks assessed                                          General Comments      Exercises General Exercises - Lower Extremity Long Arc Quad: Right, 5 reps, Seated Hip ABduction/ADduction: Both, Seated, Other (comment) (3 reps) Hip Flexion/Marching: 5 reps, Right, Seated   Assessment/Plan    PT Assessment Patient needs continued PT services  PT Problem List Decreased strength;Decreased activity tolerance;Decreased balance;Decreased mobility;Pain       PT Treatment Interventions DME instruction;Gait training;Stair training;Functional mobility training;Therapeutic activities;Therapeutic exercise;Balance training;Patient/family education    PT Goals (Current goals can be found in the Care Plan section)  Acute Rehab PT Goals Patient Stated Goal: return to  independence PT Goal Formulation: With patient Time For Goal Achievement: 09/04/23 Potential to Achieve Goals: Good    Frequency Min 1X/week     Co-evaluation               AM-PAC PT "6 Clicks" Mobility  Outcome Measure Help needed turning from your back to your side while in a flat bed without using bedrails?: A Little Help needed moving from lying on your back to sitting on the side of a flat bed without using bedrails?: A Little Help needed moving to and from a bed to a chair (including a wheelchair)?: A Little Help needed standing up from a chair using your arms (e.g., wheelchair or bedside chair)?: A Lot Help needed to walk in hospital room?: A Lot Help needed climbing 3-5 steps with a railing? : Total 6 Click Score: 14    End of Session Equipment Utilized During Treatment: Gait belt Activity Tolerance: Patient tolerated treatment well Patient left: in chair;with call bell/phone within reach Nurse Communication: Mobility status;Patient requests pain meds PT Visit Diagnosis: Unsteadiness on feet (R26.81);Other abnormalities of gait and mobility (R26.89);Difficulty in walking, not elsewhere classified (R26.2);Pain Pain - Right/Left: Right Pain - part of body: Leg    Time: 1610-9604 PT Time Calculation (min) (ACUTE ONLY): 39 min   Charges:   PT Evaluation $PT Eval Low Complexity: 1 Low PT Treatments $Therapeutic Activity: 23-37 mins  PT General Charges $$ ACUTE PT VISIT: 1 Visit         Lillia Pauls, PT, DPT Acute Rehabilitation Services Office [phone]   Norval Morton 08/21/2023, 10:02 AM

## 2023-08-21 NOTE — Progress Notes (Signed)
Inpatient Rehab Admissions Coordinator:  ? ?Per therapy recommendations,  patient was screened for CIR candidacy by Devaney Segers, MS, CCC-SLP. At this time, Pt. Appears to be a a potential candidate for CIR. I will place   order for rehab consult per protocol for full assessment. Please contact me any with questions. ? ?Trine Fread, MS, CCC-SLP ?Rehab Admissions Coordinator  ?[phone] (celll) ?[phone] (office) ? ?

## 2023-08-21 NOTE — Progress Notes (Signed)
Inpatient Rehab Admissions Coordinator:    I met with pt. And daughter at bedside to discuss potential CIR admit. 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 She would like to pursue admission with the goal of going home independently alone. Her daughter states that if Pt. Does not progress to independent level, she can come stay with Pt. At d/c. I do not have a bed this weekend but will follow for potential admit Monday.   Megan Salon, MS, CCC-SLP Rehab Admissions Coordinator  [phone] (celll) [phone] (office)

## 2023-08-22 LAB — CBC
HCT: 30.8 % — ABNORMAL LOW (ref 36.0–46.0)
Hemoglobin: 10.1 g/dL — ABNORMAL LOW (ref 12.0–15.0)
MCH: 35.2 pg — ABNORMAL HIGH (ref 26.0–34.0)
MCHC: 32.8 g/dL (ref 30.0–36.0)
MCV: 107.3 fL — ABNORMAL HIGH (ref 80.0–100.0)
Platelets: 109 10*3/uL — ABNORMAL LOW (ref 150–400)
RBC: 2.87 MIL/uL — ABNORMAL LOW (ref 3.87–5.11)
RDW: 13.2 % (ref 11.5–15.5)
WBC: 7.6 10*3/uL (ref 4.0–10.5)
nRBC: 0 % (ref 0.0–0.2)

## 2023-08-22 MED ORDER — ASPIRIN 81 MG PO CHEW: 81.0000 mg | CHEWABLE_TABLET | Freq: Two times a day (BID) | ORAL | 0 refills | Status: DC

## 2023-08-22 MED ORDER — OXYCODONE-ACETAMINOPHEN 5-325 MG PO TABS: 1.0000 | ORAL_TABLET | Freq: Two times a day (BID) | ORAL | 0 refills | Status: DC | PRN

## 2023-08-22 NOTE — Evaluation (Addendum)
Occupational Therapy Evaluation Patient Details Name: Denise [name] MRN: [id number] DOB: November 02, 1950 Today's Date: 08/22/2023   History of Present Illness Pt is a 72 y.o. F who presents 08/20/2023 for surgical treatment of right femur lytic lesions now s/p prophylactic intramedullary nail of right femur. Significant PMH: metastatic breast cancer.   Clinical Impression   PTA patient independent and driving. Admitted for above and presents with problem list below.   She requires max assist for bed mobility, setup to total assist for ADLs.  Cognitively, pt presenting with slow processing and difficulty following multiple step commands.  She is oriented but demonstrates difficulty attending to task and problem solving; at times only voicing "I'm good" when asking about home setup.  Sitting EOB, she required mod assist to maintain upright position with noted L and posterior lean; pt reports feeling unsteady but unable to correct balance at EOB. Assisted back to supine with max assist and notified RN.  RN assessed patient, RN reports likely due to pain medication. BP stable in supine and sitting: 100/65, 101/68 respectively.   Based on performance today, believe pt will best benefit from continued OT services acutely and after dc at an inpatient setting with >3hrs/day to optimize independence, safety and return to PLOF with ADLs and mobility.     If plan is discharge home, recommend the following: Two people to help with walking and/or transfers;A lot of help with bathing/dressing/bathroom;Assistance with cooking/housework;Assist for transportation;Help with stairs or ramp for entrance    Functional Status Assessment  Patient has had a recent decline in their functional status and demonstrates the ability to make significant improvements in function in a reasonable and predictable amount of time.  Equipment Recommendations  BSC/3in1    Recommendations for Other Services       Precautions /  Restrictions Precautions Precautions: Fall Restrictions Weight Bearing Restrictions: Yes RLE Weight Bearing: Weight bearing as tolerated      Mobility Bed Mobility Overal bed mobility: Needs Assistance Bed Mobility: Supine to Sit, Sit to Supine     Supine to sit: Max assist Sit to supine: Max assist   General bed mobility comments: pt requires support to guide R LE towards EOB but with increased time able to manage L LE, max assist to scoot hips and min assist hand held support to bring trunk to upright position    Transfers                   General transfer comment: deferred due to impaired sitting balance at eOB      Balance Overall balance assessment: Needs assistance Sitting-balance support: Feet supported Sitting balance-Leahy Scale: Poor Sitting balance - Comments: L posterior lean with little to no awareness, requiring at least mod assist to maintain upright position.  She reports feeling unsteady Postural control: Posterior lean, Left lateral lean                                 ADL either performed or assessed with clinical judgement   ADL Overall ADL's : Needs assistance/impaired                     Lower Body Dressing: Total assistance;Sitting/lateral leans;Bed level     Toilet Transfer Details (indicate cue type and reason): deferred due to impaired sitting balance at EOB         Functional mobility during ADLs: Maximal assistance General ADL Comments: limited  session due to cognition and imparied sitting balance, assisted back to bed and notified RN     Vision Baseline Vision/History: 1 Wears glasses Ability to See in Adequate Light: 1 Impaired Patient Visual Report: Other (comment) (difficult to determine) Additional Comments: Patient donned glasses and reports vision has worsened since the radiation, but inconsistent report if it has changed since this surgery.  Reports R eye is worse than L.     Perception          Praxis         Pertinent Vitals/Pain Pain Assessment Pain Assessment: Faces Faces Pain Scale: Hurts even more Pain Location: RLE Pain Descriptors / Indicators: Operative site guarding Pain Intervention(s): Limited activity within patient's tolerance, Monitored during session, Repositioned     Extremity/Trunk Assessment Upper Extremity Assessment Upper Extremity Assessment: Generalized weakness   Lower Extremity Assessment Lower Extremity Assessment: Defer to PT evaluation   Cervical / Trunk Assessment Cervical / Trunk Assessment: Normal   Communication Communication Communication: Difficulty communicating thoughts/reduced clarity of speech;Difficulty following commands/understanding (sow processing when verbalizing, then frequently stating "Im good" instead of answering the question asked) Following commands: Follows one step commands consistently;Follows one step commands with increased time;Follows multi-step commands inconsistently Cueing Techniques: Verbal cues;Tactile cues;Visual cues   Cognition Arousal: Alert Behavior During Therapy: Flat affect Overall Cognitive Status: Impaired/Different from baseline Area of Impairment: Attention, Following commands, Safety/judgement, Awareness, Problem solving                   Current Attention Level: Sustained   Following Commands: Follows one step commands consistently, Follows one step commands with increased time, Follows multi-step commands inconsistently Safety/Judgement: Decreased awareness of deficits Awareness: Emergent Problem Solving: Slow processing, Difficulty sequencing, Requires verbal cues, Decreased initiation, Requires tactile cues General Comments: patient oriented, demosntrating some difficulty attending to task and answering questions about home setup (for example: stating "im good" when asked about grabbars in her bathroom). She has slowed processing and decreased problem sovling, difficulty sequencing  to/from EOB and poor awareness of impaired balance sitting EOB. RN notified and reports likely pain medication related.     General Comments  BP supine 100/65, sitting 101/68 taken in R arm.  Incision dressing intact. RN notified and assessed pt after poor balance and decreased cognition noticed by this OT.    Exercises     Shoulder Instructions      Home Living Family/patient expects to be discharged to:: Private residence Living Arrangements: Alone Available Help at Discharge: Family (daughter) Type of Home: House Home Access: Level entry     Home Layout: One level     Bathroom Shower/Tub: Producer, television/film/video: Standard Bathroom Accessibility: Yes How Accessible: Accessible via walker;Accessible via wheelchair Home Equipment: Educational psychologist (4 wheels);Rolling Walker (2 wheels);Cane - single point;Grab bars - tub/shower;Grab bars - toilet      Lives With: Spouse    Prior Functioning/Environment Prior Level of Function : Independent/Modified Independent;Driving             Mobility 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          OT Problem List: Decreased strength;Decreased activity tolerance;Impaired balance (sitting and/or standing);Pain;Decreased knowledge of precautions;Decreased knowledge of use of DME or AE;Decreased safety awareness;Decreased cognition      OT Treatment/Interventions: Self-care/ADL training;Therapeutic exercise;DME and/or AE instruction;Therapeutic activities;Patient/family education;Balance training    OT Goals(Current goals can be found in the care plan section) Acute Rehab OT  Goals Patient Stated Goal: get better OT Goal Formulation: With patient Time For Goal Achievement: 09/05/23 Potential to Achieve Goals: Fair  OT Frequency: Min 1X/week    Co-evaluation              AM-PAC OT "6 Clicks" Daily Activity     Outcome Measure Help from another person  eating meals?: A Little Help from another person taking care of personal grooming?: A Little Help from another person toileting, which includes using toliet, bedpan, or urinal?: A Lot Help from another person bathing (including washing, rinsing, drying)?: A Lot Help from another person to put on and taking off regular upper body clothing?: A Little Help from another person to put on and taking off regular lower body clothing?: Total 6 Click Score: 14   End of Session Nurse Communication: Mobility status;Other (comment) (cognition and impaired balance changes noted from PT session yesterday)  Activity Tolerance:   Patient left: in bed;with call bell/phone within reach;with bed alarm set;with nursing/sitter in room  OT Visit Diagnosis: Other abnormalities of gait and mobility (R26.89);Muscle weakness (generalized) (M62.81);Pain;Other symptoms and signs involving cognitive function Pain - Right/Left: Right Pain - part of body: Leg                Time: 1300-1326 OT Time Calculation (min): 26 min Charges:  OT General Charges $OT Visit: 1 Visit OT Evaluation $OT Eval Moderate Complexity: 1 Mod OT Treatments $Self Care/Home Management : 8-22 mins  Barry Brunner, OT Acute Rehabilitation Services Office [phone]   Chancy Milroy 08/22/2023, 2:29 PM

## 2023-08-22 NOTE — PMR Pre-admission (Signed)
PMR Admission Coordinator Pre-Admission Assessment  Patient: Denise [name] is an 72 y.o., female MRN: [id number] DOB: 02-09-1951 Height: 5\' 5"  (165.1 cm) Weight: 72.6 kg  Insurance Information HMO:     PPO:      PCP:      IPA:      80/20: yes     OTHER:  PRIMARY: Medicare AB      Policy#: [id number]        Subscriber:  CM Name:       Phone#:      Fax#:  Pre-Cert#: verified online      Employer:  Benefits:  Phone #:      Name:  Eff. Date: Part A effective 04/14/2014, B effective 09/14/2014    Deduct: $1632      Out of Pocket Max: n/a      Life Max: n/a CIR: 100%      SNF: 20 full days Outpatient:      Co-Pay:  Home Health: 100%      Co-Pay:  DME:      Co-Pay:  Providers:  SECONDARY: TEFL teacher Supplement      Policy#: [id number]      Phone#:   Artist:       Phone#:   The "Data Collection Information Summary" for patients in Inpatient Rehabilitation Facilities with attached "Privacy Act Statement-Health Care Records" was provided and verbally reviewed with: Pt  Emergency Contact Information Contact Information     Name Relation Home Work Mobile   nelson,julie Daughter   [phone]      Other Contacts   None on File     Current Medical History  Patient Admitting Diagnosis: Lytic lesions of femur History of Present Illness:  Pt. Is a 72 year old female with a past medical history of metastatic breast cancer. She was found to have lytic lesions of the right femur and presented to Ortho service at [facility] on 08/20/23 for prophylactic intramedullary nail of right femur. She was noted to have some ABL anemia and leukocytosis post operatively. She was seen by PT/OT and they recommend CIR to assist return to PLOF.    Patient's medical record from [facility] has been reviewed by the rehabilitation admission coordinator and physician.  Past Medical History  Past Medical History:  Diagnosis Date   Asthma    as child   Cancer  (HCC) 12/2020   left breast IMC   Complication of anesthesia    pateitn states,' I coded when I had my D&Cmany years ago.   Dyspnea    Family history of breast cancer    Hypertension    Hypothyroidism    PONV (postoperative nausea and vomiting)    Port-A-Cath in place 02/26/2021    Has the patient had major surgery during 100 days prior to admission? Yes  Family History   family history includes Breast cancer in her cousin and mother; Cancer in her maternal uncle and paternal uncle; Heart disease in her maternal grandfather, mother, paternal grandfather, and paternal grandmother; Lung cancer in her father; Thyroid disease in her daughter, daughter, maternal aunt, maternal grandmother, and mother; Thyroid nodules (age of onset: 12) in her maternal grandmother.  Current Medications  Current Facility-Administered Medications:    0.9 %  sodium chloride infusion, , Intravenous, Continuous, Tarry Kos, MD, Last Rate: 25 mL/hr at 08/21/23 2304, New Bag at 08/21/23 2304   acetaminophen (TYLENOL) tablet 325-650 mg, 325-650 mg, Oral, Q6H PRN, Roda Shutters, [place]  M, MD, 650 mg at 08/22/23 0050   aspirin chewable tablet 81 mg, 81 mg, Oral, BID, Tarry Kos, MD, 81 mg at 08/22/23 1610   diphenhydrAMINE (BENADRYL) 12.5 MG/5ML elixir 25 mg, 25 mg, Oral, Q4H PRN, Tarry Kos, MD   docusate sodium (COLACE) capsule 100 mg, 100 mg, Oral, BID, Tarry Kos, MD, 100 mg at 08/22/23 9604   gabapentin (NEURONTIN) capsule 300 mg, 300 mg, Oral, QHS, Tarry Kos, MD, 300 mg at 08/21/23 2053   hydrALAZINE (APRESOLINE) tablet 25 mg, 25 mg, Oral, TID PRN, Tarry Kos, MD   irbesartan (AVAPRO) tablet 300 mg, 300 mg, Oral, Daily PRN **AND** hydrochlorothiazide (HYDRODIURIL) tablet 25 mg, 25 mg, Oral, Daily PRN, Tarry Kos, MD   HYDROmorphone (DILAUDID) injection 0.5-1 mg, 0.5-1 mg, Intravenous, Q4H PRN, Tarry Kos, MD, 1 mg at 08/20/23 2028   levothyroxine (SYNTHROID) tablet 75 mcg, 75 mcg, Oral, QAC  breakfast, Tarry Kos, MD, 75 mcg at 08/22/23 0510   magnesium citrate solution 1 Bottle, 1 Bottle, Oral, Once PRN, Tarry Kos, MD   methocarbamol (ROBAXIN) tablet 500 mg, 500 mg, Oral, Q6H PRN, 500 mg at 08/21/23 1800 **OR** methocarbamol (ROBAXIN) injection 500 mg, 500 mg, Intravenous, Q6H PRN, Tarry Kos, MD   metoCLOPramide (REGLAN) tablet 5-10 mg, 5-10 mg, Oral, Q8H PRN **OR** metoCLOPramide (REGLAN) injection 5-10 mg, 5-10 mg, Intravenous, Q8H PRN, Roda Shutters, Edwin Cap, MD   ondansetron (ZOFRAN) tablet 4 mg, 4 mg, Oral, Q6H PRN **OR** ondansetron (ZOFRAN) injection 4 mg, 4 mg, Intravenous, Q6H PRN, Tarry Kos, MD   oxyCODONE (Oxy IR/ROXICODONE) immediate release tablet 10-15 mg, 10-15 mg, Oral, Q4H PRN, Tarry Kos, MD, 15 mg at 08/22/23 1151   oxyCODONE (Oxy IR/ROXICODONE) immediate release tablet 5-10 mg, 5-10 mg, Oral, Q4H PRN, Tarry Kos, MD   polyethylene glycol (MIRALAX / GLYCOLAX) packet 17 g, 17 g, Oral, Daily PRN, Tarry Kos, MD   sorbitol 70 % solution 30 mL, 30 mL, Oral, Daily PRN, Tarry Kos, MD  Patients Current Diet:  Diet Order             Diet Carb Modified Fluid consistency: Thin; Room service appropriate? Yes  Diet effective now                   Precautions / Restrictions Precautions Precautions: Fall Restrictions Weight Bearing Restrictions: Yes RLE Weight Bearing: Weight bearing as tolerated   Has the patient had 2 or more falls or a fall with injury in the past year? No  Prior Activity Level Community (5-7x/wk): Pt. was working and active int he community PTA  Prior Functional Level Self Care: Did the patient need help bathing, dressing, using the toilet or eating? Independent  Indoor Mobility: Did the patient need assistance with walking from room to room (with or without device)? Independent  Stairs: Did the patient need assistance with internal or external stairs (with or without device)? Independent  Functional Cognition: Did  the patient need help planning regular tasks such as shopping or remembering to take medications? Independent  Patient Information Are you of Hispanic, Latino/a,or Spanish origin?: A. No, not of Hispanic, Latino/a, or Spanish origin What is your race?: A. White Do you need or want an interpreter to communicate with a doctor or health care staff?: 0. No  Patient's Response To:  Health Literacy and Transportation Is the patient able to respond to health literacy and transportation needs?: Yes Health Literacy -  How often do you need to have someone help you when you read instructions, pamphlets, or other written material from your doctor or pharmacy?: Never In the past 12 months, has lack of transportation kept you from medical appointments or from getting medications?: No In the past 12 months, has lack of transportation kept you from meetings, work, or from getting things needed for daily living?: No  Journalist, newspaper / Equipment Home Equipment: Information systems manager, Rollator (4 wheels), Agricultural consultant (2 wheels), [employer] - single point, Grab bars - tub/shower, Grab bars - toilet  Prior Device Use: Indicate devices/aids used by the patient prior to current illness, exacerbation or injury? None of the above  Current Functional Level Cognition  Overall Cognitive Status: Within Functional Limits for tasks assessed Orientation Level: Oriented X4    Extremity Assessment (includes Sensation/Coordination)  Upper Extremity Assessment: Overall WFL for tasks assessed  Lower Extremity Assessment: RLE deficits/detail RLE Deficits / Details: Able to perform LAQ with mild lag, limited hip flexion    ADLs       Mobility  Overal bed mobility: Needs Assistance Bed Mobility: Supine to Sit Supine to sit: Min assist General bed mobility comments: Cues for technique, use of bed rail, assits for RLE negotiation out of the bed and utilization of bed pad to scoot hips out to edge    Transfers  Overall  transfer level: Needs assistance Equipment used: Rolling walker (2 wheels) Transfers: Sit to/from Stand, Bed to chair/wheelchair/BSC Sit to Stand: Min assist, Mod assist Bed to/from chair/wheelchair/BSC transfer type:: Stand pivot Stand pivot transfers: Min assist General transfer comment: MinA from edge of bed and pivoting towards left to [facility] ModA from bedside commode to power up to standing and initially steady.    Ambulation / Gait / Stairs / Wheelchair Mobility  Ambulation/Gait Ambulation/Gait assistance: Editor, commissioning (Feet): 3 Feet Assistive device: Rolling walker (2 wheels) Gait Pattern/deviations: Step-to pattern, Decreased stance time - right, Decreased weight shift to right General Gait Details: Verbal cues for sequencing/technique, ambulating from [facility] to chair. Cues for walker use and intermittent steering required. Reminders to back up to chair and BSC prior to sitting pre-emptively Gait velocity: decreased Gait velocity interpretation: <1.8 ft/sec, indicate of risk for recurrent falls    Posture / Balance Balance Overall balance assessment: Needs assistance Sitting-balance support: Feet supported Sitting balance-Leahy Scale: Fair Standing balance support: Bilateral upper extremity supported Standing balance-Leahy Scale: Poor    Special needs/care consideration Skin surgical incision  and Special service needs orthostasis   Previous Home Environment (from acute therapy documentation) Living Arrangements: Alone  Lives With: Spouse Available Help at Discharge: Family (daughter) Type of Home: House Home Layout: One level Home Access: Level entry Bathroom Shower/Tub: Health visitor: Standard Bathroom Accessibility: Yes How Accessible: Accessible via walker, Accessible via wheelchair Home Care Services: No  Discharge Living Setting Plans for Discharge Living Setting: Patient's home Type of Home at Discharge: House Discharge Home Layout:  One level Discharge Home Access: Level entry Discharge Bathroom Shower/Tub: Walk-in shower Discharge Bathroom Toilet: Standard Discharge Bathroom Accessibility: Yes How Accessible: Accessible via walker, Accessible via wheelchair Does the patient have any problems obtaining your medications?: No  Social/Family/Support Systems Patient Roles: Other (Comment) Contact Information: [phone] Anticipated Caregiver: Daughter Drexel Iha Anticipated Caregiver's Contact Information: Pt. wants to return home independenly but daughter can stay with her 24/7 if needed Caregiver Availability: 24/7 Discharge Plan Discussed with Primary Caregiver: Yes Is Caregiver In Agreement with Plan?: Yes  Goals  Patient/Family Goal for Rehab: PT/OT mod I Expected length of stay: 10-12 days Pt/Family Agrees to Admission and willing to participate: Yes Program Orientation Provided & Reviewed with Pt/Caregiver Including Roles  & Responsibilities: Yes  Decrease burden of Care through IP rehab admission: not anticipated  Possible need for SNF placement upon discharge: not anticipated  Patient Condition: I have reviewed medical records from [facility], spoken with CM, and patient and daughter. I met with patient at the bedside for inpatient rehabilitation assessment.  Patient will benefit from ongoing PT and OT, can actively participate in 3 hours of therapy a day 5 days of the week, and can make measurable gains during the admission.  Patient will also benefit from the coordinated team approach during an Inpatient Acute Rehabilitation admission.  The patient will receive intensive therapy as well as Rehabilitation physician, nursing, social worker, and care management interventions.  Due to safety, skin/wound care, disease management, medication administration, pain management, and patient education the patient requires 24 hour a day rehabilitation nursing.  The patient is currently mod A  with  mobility and basic ADLs.  Discharge setting and therapy post discharge at home with home health is anticipated.  Patient has agreed to participate in the Acute Inpatient Rehabilitation Program and will admit today.  Preadmission Screen Completed By:  Jeronimo Greaves, 08/22/2023 2:09 PM ______________________________________________________________________   Discussed status with Dr. Riley Kill on 08/23/23 at 930 and received approval for admission today.  Admission Coordinator:  Jeronimo Greaves, CCC-SLP, time 4401 /Date 08/23/23   Assessment/Plan: Diagnosis:Right hip metastasis Does the need for close, 24 hr/day Medical supervision in concert with the patient's rehab needs make it unreasonable for this patient to be served in a less intensive setting? Yes Co-Morbidities requiring supervision/potential complications: Metastatic breast carcinoma, chemo induced peripheral neuropathy, dysphagia,acute post operative pain, hypertension  Due to bladder management, bowel management, safety, skin/wound care, disease management, medication administration, pain management, and patient education, does the patient require 24 hr/day rehab nursing? Yes Does the patient require coordinated care of a physician, rehab nurse, PT, OT, and SLP to address physical and functional deficits in the context of the above medical diagnosis(es)? Yes Addressing deficits in the following areas: balance, endurance, locomotion, strength, transferring, bowel/bladder control, bathing, dressing, feeding, grooming, toileting, swallowing, and psychosocial support Can the patient actively participate in an intensive therapy program of at least 3 hrs of therapy 5 days a week? Yes The potential for patient to make measurable gains while on inpatient rehab is fair Anticipated functional outcomes upon discharge from inpatient rehab: supervision PT, supervision OT, modified independent SLP Estimated rehab length of stay to reach the above functional  goals is: 10-12d Anticipated discharge destination: Home 10. Overall Rehab/Functional Prognosis: fair   MD Signature: Erick Colace M.D. [facility] Fellow Am Acad of Phys Med and Rehab Diplomate Am Board of Electrodiagnostic Med Fellow Am Board of Interventional Pain

## 2023-08-22 NOTE — Progress Notes (Signed)
Subjective: 2 Days Post-Op Procedure(s) (LRB): RIGHT INTRAMEDULLARY (IM) NAIL FEMORAL (Right) Patient reports pain as mild.    Objective: Vital signs in last 24 hours: Temp:  [97.9 F (36.6 C)-98.4 F (36.9 C)] 98.4 F (36.9 C) (12/08 0828) Pulse Rate:  [69-90] 90 (12/08 0828) Resp:  [15-18] 15 (12/08 0828) BP: (70-88)/(48-71) 85/71 (12/08 0828) SpO2:  [96 %-100 %] 100 % (12/08 0828)  Intake/Output from previous day: 12/07 0701 - 12/08 0700 In: 720 [P.O.:720] Out: 750 [Urine:750] Intake/Output this shift: No intake/output data recorded.  Recent Labs    08/20/23 1023  HGB 13.8   Recent Labs    08/20/23 1023  WBC 12.5*  RBC 3.83*  HCT 39.6  PLT 227   Recent Labs    08/20/23 1023  NA 133*  K 4.5  CL 102  CO2 20*  BUN 25*  CREATININE 0.77  GLUCOSE 92  CALCIUM 8.6*   No results for input(s): "LABPT", "INR" in the last 72 hours.  Neurologically intact Neurovascular intact Sensation intact distally Intact pulses distally Dorsiflexion/Plantar flexion intact Incision: scant drainage No cellulitis present Compartment soft   Assessment/Plan: 2 Days Post-Op Procedure(s) (LRB): RIGHT INTRAMEDULLARY (IM) NAIL FEMORAL (Right) Advance diet Up with therapy Plan for discharge tomorrow to CIR [facility]AT RLE Hypotension- continue iv and po fluids.  Hold bp meds and back off on narcotics.  Will order cbc      Cristie Hem 08/22/2023, 9:34 AM

## 2023-08-22 NOTE — Progress Notes (Signed)
Physical Therapy Treatment Patient Details Name: Denise [name] MRN: [id number] DOB: [birth year] Today's Date: 08/22/2023   History of Present Illness Pt is a 72 y.o. F who presents 08/20/2023 for surgical treatment of right femur lytic lesions now s/p prophylactic intramedullary nail of right femur. Significant PMH: metastatic breast cancer.    PT Comments  Continuing work on functional mobility and activity tolerance;  Session focused on functional transfers, with noted low BPs, including biggest dip once in recliner after standing and takin gpivot steps bed to recliner with RW; Noting pt was better able to stabilize herself in upright sitting, and abel to stand to RW well enough to take a few steps; BPs on the low side:     08/22/23 1500 08/22/23 1510 08/22/23 1515  Vital Signs  Patient Position (if appropriate) Orthostatic Vitals  --   --   Orthostatic Lying   BP- Lying 92/68 (MAP 76)  --   --   Pulse- Lying 81  --   --   Orthostatic Sitting  BP- Sitting 93/66 (MAP 74) (!) 79/57 (MAP 66) (!) 82/67 (MAP 73)  Pulse- Sitting 96 91 88    08/22/23 1520  Vital Signs  Patient Position (if appropriate)  --   Orthostatic Lying   BP- Lying  --   Pulse- Lying  --   Orthostatic Sitting  BP- Sitting 94/73 (MAP 81)  Pulse- Sitting 89   But at end of seeion stable in recliner with feet up; chair alarm on   If plan is discharge home, recommend the following: A little help with walking and/or transfers;A little help with bathing/dressing/bathroom;Assistance with cooking/housework;Assist for transportation;Help with stairs or ramp for entrance   Can travel by private vehicle        Equipment Recommendations  BSC/3in1    Recommendations for Other Services Rehab consult     Precautions / Restrictions Precautions Precautions: Fall Restrictions Weight Bearing Restrictions: Yes RLE Weight Bearing: Weight bearing as tolerated     Mobility  Bed Mobility Overal bed mobility:  Needs Assistance Bed Mobility: Supine to Sit     Supine to sit: Mod assist     General bed mobility comments: Heavy mod assist to elevate trunk to sit    Transfers Overall transfer level: Needs assistance Equipment used: Rolling walker (2 wheels) Transfers: Sit to/from Stand, Bed to chair/wheelchair/BSC Sit to Stand: Mod assist Stand pivot transfers: Mod assist         General transfer comment: Mod assist to power up to stand; Mod assist to manage RW with pivotal steps bed to recliner    Ambulation/Gait               General Gait Details: Deferred this session; BPs low   Stairs             Wheelchair Mobility     Tilt Bed    Modified Rankin (Stroke Patients Only)       Balance     Sitting balance-Leahy Scale: Fair       Standing balance-Leahy Scale: Poor                              Cognition Arousal: Alert Behavior During Therapy: WFL for tasks assessed/performed Overall Cognitive Status: Impaired/Different from baseline Area of Impairment: Attention, Problem solving                   Current Attention Level: Sustained  Following Commands: Follows one step commands consistently, Follows one step commands with increased time, Follows multi-step commands inconsistently Safety/Judgement: Decreased awareness of deficits Awareness: Emergent Problem Solving: Slow processing, Difficulty sequencing, Requires verbal cues, Decreased initiation, Requires tactile cues General Comments: Seemed improved from previous OT session        Exercises      General Comments General comments (skin integrity, edema, etc.):       Pertinent Vitals/Pain Pain Assessment Pain Assessment: Faces Faces Pain Scale: Hurts a little bit Pain Location: RLE Pain Descriptors / Indicators: Operative site guarding Pain Intervention(s): Limited activity within patient's tolerance    Home Living Family/patient expects to be discharged to::  Private residence Living Arrangements: Alone Available Help at Discharge: Family (daughter) Type of Home: House Home Access: Level entry       Home Layout: One level Home Equipment: Educational psychologist (4 wheels);Rolling Walker (2 wheels);Cane - single point;Grab bars - tub/shower;Grab bars - toilet      Prior Function            PT Goals (current goals can now be found in the care plan section) Acute Rehab PT Goals Patient Stated Goal: return to independence PT Goal Formulation: With patient Time For Goal Achievement: 09/04/23 Potential to Achieve Goals: Good Progress towards PT goals: Progressing toward goals (slowly)    Frequency    Min 1X/week      PT Plan      Co-evaluation              AM-PAC PT "6 Clicks" Mobility   Outcome Measure  Help needed turning from your back to your side while in a flat bed without using bedrails?: A Little Help needed moving from lying on your back to sitting on the side of a flat bed without using bedrails?: A Little Help needed moving to and from a bed to a chair (including a wheelchair)?: A Lot Help needed standing up from a chair using your arms (e.g., wheelchair or bedside chair)?: A Lot Help needed to walk in hospital room?: A Lot Help needed climbing 3-5 steps with a railing? : Total 6 Click Score: 13    End of Session Equipment Utilized During Treatment: Gait belt Activity Tolerance: Patient tolerated treatment well Patient left: in chair;with call bell/phone within reach;with chair alarm set Nurse Communication: Mobility status;Other (comment) (BPs low) PT Visit Diagnosis: Unsteadiness on feet (R26.81);Other abnormalities of gait and mobility (R26.89);Difficulty in walking, not elsewhere classified (R26.2);Pain Pain - Right/Left: Right Pain - part of body: Leg     Time: 4098-1191 PT Time Calculation (min) (ACUTE ONLY): 36 min  Charges:    $Therapeutic Activity: 23-37 mins PT General Charges $$ ACUTE PT  VISIT: 1 Visit                     Van Clines, PT  Acute Rehabilitation Services Office [phone] Secure Chat welcomed    Denise [name] 08/22/2023, 3:59 PM

## 2023-08-23 ENCOUNTER — Encounter (HOSPITAL_COMMUNITY): Payer: Self-pay | Admitting: Physical Medicine and Rehabilitation

## 2023-08-23 ENCOUNTER — Telehealth: Payer: Self-pay

## 2023-08-23 ENCOUNTER — Other Ambulatory Visit: Payer: Self-pay | Admitting: Physician Assistant

## 2023-08-23 ENCOUNTER — Other Ambulatory Visit: Payer: Self-pay

## 2023-08-23 ENCOUNTER — Inpatient Hospital Stay (HOSPITAL_COMMUNITY)
Admission: AD | Admit: 2023-08-23 | Discharge: 2023-08-31 | DRG: 560 | Disposition: A | Discharge status: Hospice | Payer: Medicare Other | Source: Intra-hospital | Attending: Physical Medicine and Rehabilitation | Admitting: Physical Medicine and Rehabilitation

## 2023-08-23 DIAGNOSIS — Z66 Do not resuscitate: Secondary | ICD-10-CM | POA: Diagnosis not present

## 2023-08-23 DIAGNOSIS — S79929A Unspecified injury of unspecified thigh, initial encounter: Secondary | ICD-10-CM | POA: Diagnosis not present

## 2023-08-23 DIAGNOSIS — D539 Nutritional anemia, unspecified: Secondary | ICD-10-CM | POA: Diagnosis present

## 2023-08-23 DIAGNOSIS — R627 Adult failure to thrive: Secondary | ICD-10-CM | POA: Diagnosis not present

## 2023-08-23 DIAGNOSIS — T17928A Food in respiratory tract, part unspecified causing other injury, initial encounter: Secondary | ICD-10-CM | POA: Diagnosis not present

## 2023-08-23 DIAGNOSIS — R339 Retention of urine, unspecified: Secondary | ICD-10-CM | POA: Diagnosis present

## 2023-08-23 DIAGNOSIS — R471 Dysarthria and anarthria: Secondary | ICD-10-CM | POA: Diagnosis present

## 2023-08-23 DIAGNOSIS — I82411 Acute embolism and thrombosis of right femoral vein: Secondary | ICD-10-CM | POA: Diagnosis not present

## 2023-08-23 DIAGNOSIS — I10 Essential (primary) hypertension: Secondary | ICD-10-CM | POA: Diagnosis present

## 2023-08-23 DIAGNOSIS — Z9049 Acquired absence of other specified parts of digestive tract: Secondary | ICD-10-CM | POA: Diagnosis not present

## 2023-08-23 DIAGNOSIS — W44F3XA Food entering into or through a natural orifice, initial encounter: Secondary | ICD-10-CM | POA: Diagnosis not present

## 2023-08-23 DIAGNOSIS — R54 Age-related physical debility: Secondary | ICD-10-CM | POA: Diagnosis present

## 2023-08-23 DIAGNOSIS — Z4789 Encounter for other orthopedic aftercare: Principal | ICD-10-CM

## 2023-08-23 DIAGNOSIS — Z801 Family history of malignant neoplasm of trachea, bronchus and lung: Secondary | ICD-10-CM

## 2023-08-23 DIAGNOSIS — Z9012 Acquired absence of left breast and nipple: Secondary | ICD-10-CM | POA: Diagnosis not present

## 2023-08-23 DIAGNOSIS — R110 Nausea: Secondary | ICD-10-CM | POA: Diagnosis not present

## 2023-08-23 DIAGNOSIS — M898X5 Other specified disorders of bone, thigh: Secondary | ICD-10-CM | POA: Diagnosis present

## 2023-08-23 DIAGNOSIS — E860 Dehydration: Secondary | ICD-10-CM | POA: Diagnosis present

## 2023-08-23 DIAGNOSIS — F54 Psychological and behavioral factors associated with disorders or diseases classified elsewhere: Secondary | ICD-10-CM

## 2023-08-23 DIAGNOSIS — M899 Disorder of bone, unspecified: Secondary | ICD-10-CM | POA: Diagnosis not present

## 2023-08-23 DIAGNOSIS — Z888 Allergy status to other drugs, medicaments and biological substances status: Secondary | ICD-10-CM

## 2023-08-23 DIAGNOSIS — Z7409 Other reduced mobility: Secondary | ICD-10-CM | POA: Diagnosis present

## 2023-08-23 DIAGNOSIS — C7931 Secondary malignant neoplasm of brain: Secondary | ICD-10-CM | POA: Diagnosis present

## 2023-08-23 DIAGNOSIS — D62 Acute posthemorrhagic anemia: Secondary | ICD-10-CM | POA: Diagnosis present

## 2023-08-23 DIAGNOSIS — K5900 Constipation, unspecified: Secondary | ICD-10-CM | POA: Diagnosis not present

## 2023-08-23 DIAGNOSIS — R5381 Other malaise: Secondary | ICD-10-CM | POA: Diagnosis not present

## 2023-08-23 DIAGNOSIS — I82441 Acute embolism and thrombosis of right tibial vein: Secondary | ICD-10-CM | POA: Diagnosis not present

## 2023-08-23 DIAGNOSIS — Z90710 Acquired absence of both cervix and uterus: Secondary | ICD-10-CM

## 2023-08-23 DIAGNOSIS — Z7401 Bed confinement status: Secondary | ICD-10-CM | POA: Diagnosis not present

## 2023-08-23 DIAGNOSIS — C7951 Secondary malignant neoplasm of bone: Secondary | ICD-10-CM | POA: Diagnosis present

## 2023-08-23 DIAGNOSIS — K5730 Diverticulosis of large intestine without perforation or abscess without bleeding: Secondary | ICD-10-CM | POA: Diagnosis not present

## 2023-08-23 DIAGNOSIS — Z923 Personal history of irradiation: Secondary | ICD-10-CM | POA: Diagnosis not present

## 2023-08-23 DIAGNOSIS — C50919 Malignant neoplasm of unspecified site of unspecified female breast: Secondary | ICD-10-CM | POA: Diagnosis not present

## 2023-08-23 DIAGNOSIS — Z79899 Other long term (current) drug therapy: Secondary | ICD-10-CM | POA: Diagnosis not present

## 2023-08-23 DIAGNOSIS — M7989 Other specified soft tissue disorders: Secondary | ICD-10-CM | POA: Diagnosis not present

## 2023-08-23 DIAGNOSIS — I82451 Acute embolism and thrombosis of right peroneal vein: Secondary | ICD-10-CM | POA: Diagnosis not present

## 2023-08-23 DIAGNOSIS — R1310 Dysphagia, unspecified: Secondary | ICD-10-CM | POA: Diagnosis present

## 2023-08-23 DIAGNOSIS — R0602 Shortness of breath: Secondary | ICD-10-CM | POA: Diagnosis not present

## 2023-08-23 DIAGNOSIS — Z803 Family history of malignant neoplasm of breast: Secondary | ICD-10-CM

## 2023-08-23 DIAGNOSIS — Z7189 Other specified counseling: Secondary | ICD-10-CM | POA: Diagnosis not present

## 2023-08-23 DIAGNOSIS — R4182 Altered mental status, unspecified: Secondary | ICD-10-CM | POA: Diagnosis not present

## 2023-08-23 DIAGNOSIS — Y842 Radiological procedure and radiotherapy as the cause of abnormal reaction of the patient, or of later complication, without mention of misadventure at the time of the procedure: Secondary | ICD-10-CM | POA: Diagnosis present

## 2023-08-23 DIAGNOSIS — I82431 Acute embolism and thrombosis of right popliteal vein: Secondary | ICD-10-CM | POA: Diagnosis not present

## 2023-08-23 DIAGNOSIS — X58XXXA Exposure to other specified factors, initial encounter: Secondary | ICD-10-CM | POA: Diagnosis present

## 2023-08-23 DIAGNOSIS — K2900 Acute gastritis without bleeding: Secondary | ICD-10-CM

## 2023-08-23 DIAGNOSIS — Z79890 Hormone replacement therapy (postmenopausal): Secondary | ICD-10-CM

## 2023-08-23 DIAGNOSIS — R109 Unspecified abdominal pain: Secondary | ICD-10-CM | POA: Diagnosis not present

## 2023-08-23 DIAGNOSIS — I6782 Cerebral ischemia: Secondary | ICD-10-CM | POA: Diagnosis not present

## 2023-08-23 DIAGNOSIS — R42 Dizziness and giddiness: Secondary | ICD-10-CM | POA: Diagnosis not present

## 2023-08-23 DIAGNOSIS — E039 Hypothyroidism, unspecified: Secondary | ICD-10-CM | POA: Diagnosis present

## 2023-08-23 DIAGNOSIS — T451X5A Adverse effect of antineoplastic and immunosuppressive drugs, initial encounter: Secondary | ICD-10-CM | POA: Diagnosis present

## 2023-08-23 DIAGNOSIS — Z515 Encounter for palliative care: Secondary | ICD-10-CM

## 2023-08-23 DIAGNOSIS — Z8349 Family history of other endocrine, nutritional and metabolic diseases: Secondary | ICD-10-CM

## 2023-08-23 DIAGNOSIS — I951 Orthostatic hypotension: Secondary | ICD-10-CM | POA: Diagnosis not present

## 2023-08-23 DIAGNOSIS — Z8249 Family history of ischemic heart disease and other diseases of the circulatory system: Secondary | ICD-10-CM

## 2023-08-23 DIAGNOSIS — G620 Drug-induced polyneuropathy: Secondary | ICD-10-CM | POA: Diagnosis present

## 2023-08-23 DIAGNOSIS — R519 Headache, unspecified: Secondary | ICD-10-CM | POA: Diagnosis present

## 2023-08-23 DIAGNOSIS — R400 Somnolence: Secondary | ICD-10-CM | POA: Diagnosis not present

## 2023-08-23 DIAGNOSIS — I77810 Thoracic aortic ectasia: Secondary | ICD-10-CM | POA: Diagnosis not present

## 2023-08-23 DIAGNOSIS — Z91018 Allergy to other foods: Secondary | ICD-10-CM

## 2023-08-23 MED ORDER — LEVOTHYROXINE SODIUM 75 MCG PO TABS
75.0000 ug | ORAL_TABLET | Freq: Every day | ORAL | Status: DC
  Administered 2023-08-24 – 2023-08-31 (×8): 75 ug via ORAL
  Filled 2023-08-23 (×8): qty 1

## 2023-08-23 MED ORDER — ONDANSETRON HCL 4 MG/2ML IJ SOLN
4.0000 mg | Freq: Four times a day (QID) | INTRAMUSCULAR | Status: DC | PRN
  Administered 2023-08-24 – 2023-08-29 (×4): 4 mg via INTRAVENOUS
  Filled 2023-08-23 (×5): qty 2

## 2023-08-23 MED ORDER — POLYETHYLENE GLYCOL 3350 17 G PO PACK
17.0000 g | PACK | Freq: Every day | ORAL | Status: DC | PRN
  Administered 2023-08-25: 17 g via ORAL
  Filled 2023-08-23: qty 1

## 2023-08-23 MED ORDER — DICLOFENAC SODIUM 1 % EX GEL
4.0000 g | Freq: Four times a day (QID) | CUTANEOUS | Status: DC
  Administered 2023-08-23 – 2023-08-29 (×13): 4 g via TOPICAL
  Filled 2023-08-23: qty 100

## 2023-08-23 MED ORDER — ACETAMINOPHEN 325 MG PO TABS
325.0000 mg | ORAL_TABLET | Freq: Four times a day (QID) | ORAL | Status: DC | PRN
  Administered 2023-08-23 – 2023-08-31 (×6): 650 mg via ORAL
  Filled 2023-08-23 (×7): qty 2

## 2023-08-23 MED ORDER — SORBITOL 70 % SOLN
30.0000 mL | Freq: Every day | Status: DC | PRN
  Administered 2023-08-27: 30 mL via ORAL
  Filled 2023-08-23 (×2): qty 30

## 2023-08-23 MED ORDER — ONDANSETRON HCL 4 MG PO TABS
4.0000 mg | ORAL_TABLET | Freq: Four times a day (QID) | ORAL | Status: DC | PRN
  Administered 2023-08-23 – 2023-08-31 (×3): 4 mg via ORAL
  Filled 2023-08-23 (×4): qty 1

## 2023-08-23 MED ORDER — DOCUSATE SODIUM 100 MG PO CAPS
100.0000 mg | ORAL_CAPSULE | Freq: Two times a day (BID) | ORAL | Status: DC
  Administered 2023-08-23 – 2023-08-31 (×16): 100 mg via ORAL
  Filled 2023-08-23 (×16): qty 1

## 2023-08-23 MED ORDER — ASPIRIN 81 MG PO CHEW
81.0000 mg | CHEWABLE_TABLET | Freq: Two times a day (BID) | ORAL | Status: DC
  Administered 2023-08-23 – 2023-08-25 (×5): 81 mg via ORAL
  Filled 2023-08-23 (×5): qty 1

## 2023-08-23 MED ORDER — OXYCODONE HCL 5 MG PO TABS
5.0000 mg | ORAL_TABLET | ORAL | Status: DC | PRN
  Administered 2023-08-24: 10 mg via ORAL
  Administered 2023-08-24: 5 mg via ORAL
  Administered 2023-08-25 – 2023-08-30 (×6): 10 mg via ORAL
  Filled 2023-08-23 (×6): qty 2
  Filled 2023-08-23: qty 1
  Filled 2023-08-23 (×3): qty 2

## 2023-08-23 MED ORDER — GABAPENTIN 300 MG PO CAPS
300.0000 mg | ORAL_CAPSULE | Freq: Every day | ORAL | Status: DC
  Administered 2023-08-23: 300 mg via ORAL
  Filled 2023-08-23: qty 1

## 2023-08-23 MED ORDER — IRBESARTAN 300 MG PO TABS: 300.0000 mg | ORAL_TABLET | Freq: Every day | ORAL | Status: DC | PRN

## 2023-08-23 MED ORDER — METHOCARBAMOL 500 MG PO TABS
500.0000 mg | ORAL_TABLET | Freq: Four times a day (QID) | ORAL | Status: DC | PRN
  Administered 2023-08-23 (×2): 500 mg via ORAL
  Filled 2023-08-23 (×2): qty 1

## 2023-08-23 MED ORDER — METHOCARBAMOL 1000 MG/10ML IJ SOLN: 500.0000 mg | Freq: Four times a day (QID) | INTRAMUSCULAR | Status: DC | PRN

## 2023-08-23 MED ORDER — HYDRALAZINE HCL 25 MG PO TABS: 25.0000 mg | ORAL_TABLET | Freq: Three times a day (TID) | ORAL | Status: DC | PRN

## 2023-08-23 MED ORDER — HYDROCHLOROTHIAZIDE 25 MG PO TABS: 25.0000 mg | ORAL_TABLET | Freq: Every day | ORAL | Status: DC | PRN

## 2023-08-23 NOTE — Telephone Encounter (Signed)
Vernona Rieger from [facility] inpatient rehab called. They need a verbal order for discharge. Please call her back #[phone] Thanks!

## 2023-08-23 NOTE — H&P (Addendum)
Physical Medicine and Rehabilitation Admission H&P       HPI: Denise [name] is a 72 year old right-handed female with history of hypertension, hypothyroidism, history of metastatic breast cancer with left total mastectomy 11/20/2021 as well as metastasis to the right femur followed by oncology services DrKatragadda.  Per chart review patient lives alone.  1 level home with a level entry.  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  She is a retired Hotel manager.  Presented 08/20/2023 per orthopedic services Dr.Xu for surgical treatment of right femur lytic lesions.  Underwent prophylactic intramedullary nail of right femur 08/20/2023.  Weightbearing as tolerated right lower extremity.  Acute blood loss anemia 10.1 and monitored.  Blood pressure remained soft postoperatively and monitored.  She was placed on aspirin 81 mg twice daily for DVT prophylaxis.  Therapy evaluations completed due to patient's decreased functional mobility was admitted for a comprehensive rehab program.   Review of Systems  Constitutional:  Negative for chills and fever.  HENT:  Negative for hearing loss.   Eyes:  Negative for blurred vision and double vision.  Respiratory:         Shortness of breath with exertion  Cardiovascular:  Positive for leg swelling. Negative for chest pain and palpitations.  Gastrointestinal:  Positive for constipation. Negative for heartburn, nausea and vomiting.  Genitourinary:  Negative for dysuria, flank pain and hematuria.  Musculoskeletal:  Positive for joint pain and myalgias.  Skin:  Negative for rash.  All other systems reviewed and are negative.       Past Medical History:  Diagnosis Date   Asthma      as child   Cancer (HCC) 12/2020    left breast IMC   Complication of anesthesia      pateitn states,' I coded when I had my D&Cmany years ago.   Dyspnea     Family history of breast cancer     Hypertension     Hypothyroidism     PONV (postoperative  nausea and vomiting)     Port-A-Cath in place 02/26/2021             Past Surgical History:  Procedure Laterality Date   ABDOMINAL HYSTERECTOMY       BIOPSY   01/17/2018    Procedure: BIOPSY;  Surgeon: Malissa Hippo, MD;  [facility]: [facility];  Service: Endoscopy;;  duodenum,gastric   CHOLECYSTECTOMY       COLONOSCOPY WITH PROPOFOL N/A 01/17/2018    Procedure: COLONOSCOPY WITH PROPOFOL;  Surgeon: Malissa Hippo, MD;  Location: [facility];  Service: Endoscopy;  Laterality: N/A;  7:30   DILATION AND CURETTAGE OF UTERUS       ESOPHAGOGASTRODUODENOSCOPY (EGD) WITH PROPOFOL N/A 01/17/2018    Procedure: ESOPHAGOGASTRODUODENOSCOPY (EGD) WITH PROPOFOL;  Surgeon: Malissa Hippo, MD;  [facility]: [facility];  Service: Endoscopy;  Laterality: N/A;   POLYPECTOMY   01/17/2018    Procedure: POLYPECTOMY;  Surgeon: Malissa Hippo, MD;  Location: [facility];  Service: Endoscopy;;  transverse colon, cecal   PORTACATH PLACEMENT Right 02/17/2021    Procedure: INSERTION PORT-A-CATH;  Surgeon: Abigail Miyamoto, MD;  Location: [facility];  Service: General;  Laterality: Right;   RADIOACTIVE SEED GUIDED AXILLARY SENTINEL LYMPH NODE Left 11/20/2021    Procedure: RADIOACTIVE SEED GUIDED LEFT AXILLARY SENTINEL LYMPH NODE DISSECTION;  Surgeon: Abigail Miyamoto, MD;  Location: [facility];  Service: General;  Laterality: Left;   TOTAL MASTECTOMY Left 11/20/2021  Procedure: LEFT TOTAL MASTECTOMY;  Surgeon: Abigail Miyamoto, MD;  Location: [facility];  Service: General;  Laterality: Left;             Family History  Problem Relation Age of Onset   Breast cancer Mother          dx in her 8s   Heart disease Mother     Thyroid disease Mother     Lung cancer Father          dx in his 21s   Thyroid disease Maternal Aunt     Thyroid nodules Maternal Grandmother 35        goiter   Thyroid disease Maternal Grandmother     Heart disease Maternal Grandfather      Heart disease Paternal Grandmother     Heart disease Paternal Grandfather     Thyroid disease Daughter     Thyroid disease Daughter     Cancer Maternal Uncle          NOS   Cancer Paternal Uncle          NOS   Breast cancer Cousin          pat first cousin died in her 49s;         Social History:  reports that she has never smoked. She has never used smokeless tobacco. She reports that she does not drink alcohol and does not use drugs. Allergies:  Allergies       Allergies  Allergen Reactions   Exforge [Amlodipine Besylate-Valsartan] Nausea And Vomiting   Gadolinium Derivatives Anaphylaxis, Hives and Itching      Pt should not have MRI contrast in outpt facility. Pt developed hives and itching AFTER taking 13 hr prep. Per Dr. Charise Killian. 04/23/23   Gadopiclenol Hives and Itching      13 hr prep prior to contrast admin    Tape Other (See Comments)      Redness and Irritation   Cheese        Hard cheese   Levofloxacin In D5w Hives   Other     Proanthocyanidin Other (See Comments)   Strawberry Extract Diarrhea      Seeds,nuts, lettuce, grapes   Wild Lettuce Extract (Lactuca Virosa)     Yeast-Derived Drug Products Hives      Mold on bread            Medications Prior to Admission  Medication Sig Dispense Refill   acetaminophen (TYLENOL) 500 MG tablet Take 500-1,000 mg by mouth every 6 (six) hours as needed (pain.).       aspirin-acetaminophen-caffeine (EXCEDRIN MIGRAINE) 250-250-65 MG tablet Take 1-2 tablets by mouth every 8 (eight) hours as needed for headache or migraine.       cetirizine (ZYRTEC) 10 MG tablet Take 10 mg by mouth daily as needed for allergies.       dexamethasone (DECADRON) 4 MG tablet TAKE 1 TABLET BY MOUTH TWICE DAILY WITH MEALS 60 tablet 1   gabapentin (NEURONTIN) 300 MG capsule Take 300 mg by mouth at bedtime.       hydrALAZINE (APRESOLINE) 25 MG tablet Take 25 mg by mouth 3 (three) times daily as needed (if systolic bp is greater than 180 or diastolic  blood pressure greater than 90).       levothyroxine (SYNTHROID) 75 MCG tablet Take 75 mcg by mouth daily before breakfast.       lidocaine (LIDODERM) 5 % Place 1 patch onto the skin daily  as needed (pain.). Remove & Discard patch within 12 hours or as directed by MD       lidocaine (XYLOCAINE) 2 % solution Use as directed 15 mLs in the mouth or throat as needed (reflux). 100 mL 2   Metamucil Fiber CHEW Chew 1 tablet by mouth daily as needed (constipation.).       metoCLOPramide (REGLAN) 10 MG tablet Take 1 tablet (10 mg total) by mouth every 6 (six) hours as needed for nausea or vomiting. 30 tablet 0   olmesartan-hydrochlorothiazide (BENICAR HCT) 40-25 MG tablet Take 1 tablet by mouth daily as needed (elevated bp).       ondansetron (ZOFRAN) 4 MG tablet Take 1 tablet (4 mg total) by mouth every 8 (eight) hours as needed for nausea or vomiting. 30 tablet 3   pantoprazole (PROTONIX) 40 MG tablet Take 1 tablet (40 mg total) by mouth 2 (two) times daily before a meal. 60 tablet 3   phenol (CHLORASEPTIC) 1.4 % LIQD Use as directed 1 spray in the mouth or throat as needed for throat irritation / pain.       prochlorperazine (COMPAZINE) 10 MG tablet Take 1 tablet (10 mg total) by mouth 2 (two) times daily as needed for nausea or vomiting. (Patient taking differently: Take 10 mg by mouth at bedtime.) 10 tablet 0   sertraline (ZOLOFT) 25 MG tablet Take 1 tablet (25 mg total) by mouth daily. (Patient taking differently: Take 25 mg by mouth at bedtime.) 90 tablet 3   sucralfate (CARAFATE) 1 g tablet Take 1 tablet (1 g total) by mouth every 4 (four) hours as needed. (Patient taking differently: Take 1 g by mouth 2 (two) times daily as needed (indigestion/heartburn.).) 180 tablet 0   diphenhydrAMINE (BENADRYL) 25 MG tablet Take 1 tablet (25 mg total) by mouth every 6 (six) hours. (Patient not taking: Reported on 08/17/2023) 20 tablet 0   morphine 10 MG/5ML solution Take 10 mg by mouth every 4 (four) hours as  needed.       promethazine (PHENERGAN) 25 MG suppository Place 1 suppository (25 mg total) rectally every 6 (six) hours as needed for nausea or vomiting. (Patient taking differently: Place 25 mg rectally daily as needed for nausea or vomiting.) 12 each 0              Home: Home Living Family/patient expects to be discharged to:: Private residence Living Arrangements: Alone Available Help at Discharge: Family (daughter) Type of Home: House Home Access: Level entry Home Layout: One level Bathroom Shower/Tub: Health visitor: Standard Bathroom Accessibility: Yes Home Equipment: Information systems manager, Rollator (4 wheels), Agricultural consultant (2 wheels), [employer] - single point, Grab bars - tub/shower, Grab bars - toilet  Lives With: Spouse   Functional History: Prior Function Prior Level of Function : Independent/Modified Independent, Driving Mobility 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   Functional Status:  Mobility: Bed Mobility Overal bed mobility: Needs Assistance Bed Mobility: Supine to Sit Supine to sit: Mod assist Sit to supine: Max assist General bed mobility comments: Heavy mod assist to elevate trunk to sit Transfers Overall transfer level: Needs assistance Equipment used: Rolling walker (2 wheels) Transfers: Sit to/from Stand, Bed to chair/wheelchair/BSC Sit to Stand: Mod assist Bed to/from chair/wheelchair/BSC transfer type:: Stand pivot Stand pivot transfers: Mod assist General transfer comment: Mod assist to power up to stand; Mod assist to manage RW with pivotal steps bed to recliner Ambulation/Gait Ambulation/Gait assistance: Min assist  Gait Distance (Feet): 3 Feet Assistive device: Rolling walker (2 wheels) Gait Pattern/deviations: Step-to pattern, Decreased stance time - right, Decreased weight shift to right General Gait Details: Deferred this session; BPs low Gait velocity: decreased Gait  velocity interpretation: <1.8 ft/sec, indicate of risk for recurrent falls   ADL: ADL Overall ADL's : Needs assistance/impaired Lower Body Dressing: Total assistance, Sitting/lateral leans, Bed level Toilet Transfer Details (indicate cue type and reason): deferred due to impaired sitting balance at EOB Functional mobility during ADLs: Maximal assistance General ADL Comments: limited session due to cognition and imparied sitting balance, assisted back to bed and notified RN   Cognition: Cognition Overall Cognitive Status: Impaired/Different from baseline Orientation Level: Oriented X4 Cognition Arousal: Alert Behavior During Therapy: WFL for tasks assessed/performed Overall Cognitive Status: Impaired/Different from baseline Area of Impairment: Attention, Problem solving Current Attention Level: Sustained Following Commands: Follows one step commands consistently, Follows one step commands with increased time, Follows multi-step commands inconsistently Safety/Judgement: Decreased awareness of deficits Awareness: Emergent Problem Solving: Slow processing, Difficulty sequencing, Requires verbal cues, Decreased initiation, Requires tactile cues General Comments: Seemed improved from previous OT session   Physical Exam: Blood pressure 93/64, pulse 79, temperature 98.8 F (37.1 C), resp. rate 16, height 5\' 5"  (1.651 m), weight 72.6 kg, SpO2 98%. Physical Exam Skin:    Comments: Right hip incision dressed.  Appropriately tender  Neurological:     Comments: Patient is alert oriented x 3 and follows commands.      General: No acute distress Mood and affect are appropriate Heart: Regular rate and rhythm no rubs murmurs or extra sounds Lungs: Clear to auscultation, breathing unlabored, no rales or wheezes Abdomen: Positive bowel sounds, soft nontender to palpation, nondistended Extremities: No clubbing, cyanosis, or edema Skin: No evidence of breakdown, no evidence of rash Neurologic:  mildly delayed responses with mild dysarthria motor strength is 5/5 in bilateral deltoid, bicep, tricep, grip, hip flexor, 4/5 left and trace right knee extensors, 5/5 B ankle dorsiflexor and plantar flexor Sensory exam normal sensation to light touch  in bilateral upper and lower extremities  Musculoskeletal: Full range of motion in BUE No joint swelling Post op Right thigh edema with ecchymosis posterior to hip incisions Fullness and mild tenderness Right popliteal fossa    Lab Results Last 48 Hours        Results for orders placed or performed during the hospital encounter of 08/20/23 (from the past 48 hour(s))  CBC     Status: Abnormal    Collection Time: 08/22/23 10:42 AM  Result Value Ref Range    WBC 7.6 4.0 - 10.5 K/uL    RBC 2.87 (L) 3.87 - 5.11 MIL/uL    Hemoglobin 10.1 (L) 12.0 - 15.0 g/dL    HCT 04.5 (L) 40.9 - 46.0 %    MCV 107.3 (H) 80.0 - 100.0 fL    MCH 35.2 (H) 26.0 - 34.0 pg    MCHC 32.8 30.0 - 36.0 g/dL    RDW 81.1 91.4 - 78.2 %    Platelets 109 (L) 150 - 400 K/uL    nRBC 0.0 0.0 - 0.2 %      Comment: Performed at [facility], [address], [place], [place] [address]      Imaging Results (Last 48 hours)  No results found.         Blood pressure 93/64, pulse 79, temperature 98.8 F (37.1 C), resp. rate 16, height 5\' 5"  (1.651 m), weight 72.6 kg, SpO2 98%.  Medical Problem List and Plan: 1. Functional deficits secondary to metastatic breast cancer with metastasis to right femur.  Status post prophylactic intramedullary nail of right femur for right femur lytic lesions 08/20/2023.  Weightbearing as tolerated             -patient may  shower starting 12/10             -ELOS/Goals: 10-12d 2.  Antithrombotics: -DVT/anticoagulation:  Mechanical: Antiembolism stockings, thigh (TED hose) Bilateral lower extremities, check doppler LEs             -antiplatelet therapy: Aspirin 81 mg twice daily 3. Pain Management: Neurontin 300 mg nightly.   Oxycodone/Robaxin as needed 4. Mood/Behavior/Sleep: Provide emotional support             -antipsychotic agents: N/A 5. Neuropsych/cognition: This patient is capable of making decisions on her own behalf. 6. Skin/Wound Care: Routine skin checks 7. Fluids/Electrolytes/Nutrition: Routine in and outs with follow-up chemistries 8.  Acute blood loss anemia.  Follow-up CBC 9.  Hypothyroidism.  Synthroid 10.  Hypertension.  Blood pressure has been soft.  Currently on no scheduled antihypertensive medications.  Patient had been on Benicar 40-25 mg daily as needed prior to admission.  11.  Peripheral neuropathy due to chemotherapy   12.  Macrocytic anemia check B12 13.  Hx metastatic brain lesion s/p radiation therapy Oct 2023  14.  Dysphagia like due to radiation therapy D3 diet Charlton Amor, PA-C 08/23/2023 "I have personally performed a face to face diagnostic evaluation of this patient.  Additionally, I have reviewed and concur with the physician assistant's documentation above." Erick Colace M.D. [facility] Fellow Am Acad of Phys Med and Rehab Diplomate Am Board of Electrodiagnostic Med Fellow Am Board of Interventional Pain

## 2023-08-23 NOTE — Care Management Important Message (Signed)
Important Message  Patient Details  Name: Denise [name] MRN: [id number] Date of Birth: 23-Feb-1951   Important Message Given:  Yes - Medicare IM     Sherilyn Banker 08/23/2023, 4:15 PM

## 2023-08-23 NOTE — Progress Notes (Signed)
Physical Therapy Treatment Patient Details Name: Denise [name] MRN: [id number] DOB: October 17, 1950 Today's Date: 08/23/2023   History of Present Illness Pt is a 72 y.o. F who presents 08/20/2023 for surgical treatment of right femur lytic lesions now s/p prophylactic intramedullary nail of right femur. Significant PMH: metastatic breast cancer.    PT Comments  Continuing work on functional mobility and activity tolerance;  session focused on more steps for amb with chair follow for safety; Able to walk in-room distance with RW, though pt rushed to meet her ditance goal and sit due to pain; very good participation; Patient will benefit from intensive inpatient follow up therapy, >3 hours/day     If plan is discharge home, recommend the following: A little help with walking and/or transfers;A little help with bathing/dressing/bathroom;Assistance with cooking/housework;Assist for transportation;Help with stairs or ramp for entrance   Can travel by private vehicle        Equipment Recommendations  BSC/3in1    Recommendations for Other Services       Precautions / Restrictions Precautions Precautions: Fall Restrictions RLE Weight Bearing: Weight bearing as tolerated     Mobility  Bed Mobility Overal bed mobility: Needs Assistance Bed Mobility: Supine to Sit     Supine to sit: Mod assist     General bed mobility comments: Heavy mod assist to elevate trunk to sit    Transfers Overall transfer level: Needs assistance Equipment used: Rolling walker (2 wheels) Transfers: Sit to/from Stand, Bed to chair/wheelchair/BSC Sit to Stand: Mod assist   Step pivot transfers: Mod assist       General transfer comment: Mod assist to power up to stand; Mod assist to manage RW with pivotal steps bed to [facility]    Ambulation/Gait Ambulation/Gait assistance: Min assist, +2 safety/equipment (chair follow) Gait Distance (Feet): 12 Feet Assistive device: Rolling walker (2 wheels) Gait  Pattern/deviations: Step-to pattern, Decreased stance time - right, Decreased weight shift to right       General Gait Details: Cues to self-monitor for activity tolerance; painful and taking fast steps to meet the goal of walking to the window before sitting   Stairs             Wheelchair Mobility     Tilt Bed    Modified Rankin (Stroke Patients Only)       Balance     Sitting balance-Leahy Scale: Fair       Standing balance-Leahy Scale: Poor                              Cognition Arousal: Alert Behavior During Therapy: WFL for tasks assessed/performed Overall Cognitive Status: Impaired/Different from baseline Area of Impairment: Attention, Problem solving                               General Comments: Improving        Exercises      General Comments General comments (skin integrity, edema, etc.): Daughter julie, present and helpful      Pertinent Vitals/Pain Pain Assessment Pain Assessment: Faces Faces Pain Scale: Hurts even more Pain Location: RLE Pain Descriptors / Indicators: Operative site guarding Pain Intervention(s): Monitored during session    Home Living                          Prior Function  PT Goals (current goals can now be found in the care plan section) Acute Rehab PT Goals Patient Stated Goal: return to independence PT Goal Formulation: With patient Time For Goal Achievement: 09/04/23 Potential to Achieve Goals: Good Progress towards PT goals: Progressing toward goals    Frequency    Min 1X/week      PT Plan      Co-evaluation              AM-PAC PT "6 Clicks" Mobility   Outcome Measure  Help needed turning from your back to your side while in a flat bed without using bedrails?: A Little Help needed moving from lying on your back to sitting on the side of a flat bed without using bedrails?: A Little Help needed moving to and from a bed to a chair  (including a wheelchair)?: A Lot Help needed standing up from a chair using your arms (e.g., wheelchair or bedside chair)?: A Lot Help needed to walk in hospital room?: A Lot Help needed climbing 3-5 steps with a railing? : Total 6 Click Score: 13    End of Session Equipment Utilized During Treatment: Gait belt Activity Tolerance: Patient tolerated treatment well Patient left: in chair;with call bell/phone within reach;with chair alarm set Nurse Communication: Mobility status PT Visit Diagnosis: Unsteadiness on feet (R26.81);Other abnormalities of gait and mobility (R26.89);Difficulty in walking, not elsewhere classified (R26.2);Pain Pain - Right/Left: Right Pain - part of body: Leg     Time: 0981-1914 PT Time Calculation (min) (ACUTE ONLY): 22 min  Charges:    $Gait Training: 8-22 mins PT General Charges $$ ACUTE PT VISIT: 1 Visit                     Van Clines, PT  Acute Rehabilitation Services Office [phone] Secure Chat welcomed    Levi Aland 08/23/2023, 2:09 PM

## 2023-08-23 NOTE — Discharge Summary (Signed)
Patient ID: Denise [name] MRN: [id number] DOB/AGE: 1951-03-02 72 y.o.  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   Dyspnea    Family history of breast cancer    Hypertension    Hypothyroidism    PONV (postoperative nausea and vomiting)    Port-A-Cath in place 02/26/2021    Surgeries: Procedure(s): RIGHT INTRAMEDULLARY (IM) NAIL FEMORAL on 08/20/2023   Consultants (if any):   Discharged Condition: Improved  Hospital Course: Denise [name] is an 72 y.o. 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    She was given perioperative antibiotics:  Anti-infectives (From admission, onward)    Start     Dose/Rate Route Frequency Ordered Stop   08/20/23 1800  ceFAZolin (ANCEF) IVPB 2g/100 mL premix        2 g 200 mL/hr over 30 Minutes Intravenous Every 6 hours 08/20/23 1718 08/21/23 0831   08/20/23 0945  ceFAZolin (ANCEF) IVPB 2g/100 mL premix        2 g 200 mL/hr over 30 Minutes Intravenous On call to O.R. 08/20/23 0930 08/20/23 1208     .  She was given sequential compression devices, early ambulation, and appropriate chemoprophylaxis for DVT prophylaxis.  She benefited maximally from the hospital stay and there were no complications.    Recent vital signs:  Vitals:   08/23/23 0857 08/23/23 1514  BP: 98/65 99/71  Pulse: 74 80  Resp: 16 17  Temp: 98.5 F (36.9 C) 98 F (36.7  C)  SpO2: 98% 100%    Recent laboratory studies:  Lab Results  Component Value Date   HGB 10.1 (L) 08/22/2023   HGB 13.8 08/20/2023   HGB 11.4 (L) 08/07/2023   Lab Results  Component Value Date   WBC 7.6 08/22/2023   PLT 109 (L) 08/22/2023   Lab Results  Component Value Date   INR 1.3 (H) 07/11/2021   Lab Results  Component Value Date   NA 133 (L) 08/20/2023   K 4.5 08/20/2023   CL 102 08/20/2023   CO2 20 (L) 08/20/2023   BUN 25 (H) 08/20/2023   CREATININE 0.77 08/20/2023   GLUCOSE 92 08/20/2023    Discharge Medications:   Allergies as of 08/23/2023       Reactions   Exforge [amlodipine Besylate-valsartan] Nausea And Vomiting   Gadolinium Derivatives Anaphylaxis, Hives, Itching   Pt should not have MRI contrast in outpt facility. Pt developed hives and itching AFTER taking 13 hr prep. Per Dr. Charise Killian. 04/23/23   Gadopiclenol Hives, Itching   13 hr prep prior to contrast admin    Tape Other (See Comments)   Redness and Irritation   Cheese    Hard cheese   Levofloxacin In D5w Hives   Other    Proanthocyanidin Other (See Comments)   Strawberry Extract Diarrhea   Seeds,nuts, lettuce, grapes   Wild Lettuce Extract (lactuca Virosa)  Yeast-derived Drug Products Hives   Mold on bread        Medication List     STOP taking these medications    acetaminophen 500 MG tablet Commonly known as: TYLENOL   diphenhydrAMINE 25 MG tablet Commonly known as: BENADRYL   morphine 10 MG/5ML solution       TAKE these medications    aspirin 81 MG chewable tablet Chew 1 tablet (81 mg total) by mouth 2 (two) times daily.   aspirin-acetaminophen-caffeine 250-250-65 MG tablet Commonly known as: EXCEDRIN MIGRAINE Take 1-2 tablets by mouth every 8 (eight) hours as needed for headache or migraine.   cetirizine 10 MG tablet Commonly known as: ZYRTEC Take 10 mg by mouth daily as needed for allergies.   dexamethasone 4 MG tablet Commonly known as: DECADRON TAKE 1  TABLET BY MOUTH TWICE DAILY WITH MEALS   gabapentin 300 MG capsule Commonly known as: NEURONTIN Take 300 mg by mouth at bedtime.   hydrALAZINE 25 MG tablet Commonly known as: APRESOLINE Take 25 mg by mouth 3 (three) times daily as needed (if systolic bp is greater than 180 or diastolic blood pressure greater than 90).   levothyroxine 75 MCG tablet Commonly known as: SYNTHROID Take 75 mcg by mouth daily before breakfast.   lidocaine 2 % solution Commonly known as: XYLOCAINE Use as directed 15 mLs in the mouth or throat as needed (reflux).   lidocaine 5 % Commonly known as: LIDODERM Place 1 patch onto the skin daily as needed (pain.). Remove & Discard patch within 12 hours or as directed by MD   Metamucil Fiber Chew Chew 1 tablet by mouth daily as needed (constipation.).   metoCLOPramide 10 MG tablet Commonly known as: REGLAN Take 1 tablet (10 mg total) by mouth every 6 (six) hours as needed for nausea or vomiting.   olmesartan-hydrochlorothiazide 40-25 MG tablet Commonly known as: BENICAR HCT Take 1 tablet by mouth daily as needed (elevated bp).   ondansetron 4 MG tablet Commonly known as: ZOFRAN Take 1 tablet (4 mg total) by mouth every 8 (eight) hours as needed for nausea or vomiting.   oxyCODONE-acetaminophen 5-325 MG tablet Commonly known as: Percocet Take 1-2 tablets by mouth 2 (two) times daily as needed for severe pain (pain score 7-10).   pantoprazole 40 MG tablet Commonly known as: PROTONIX Take 1 tablet (40 mg total) by mouth 2 (two) times daily before a meal.   phenol 1.4 % Liqd Commonly known as: CHLORASEPTIC Use as directed 1 spray in the mouth or throat as needed for throat irritation / pain.   prochlorperazine 10 MG tablet Commonly known as: COMPAZINE Take 1 tablet (10 mg total) by mouth 2 (two) times daily as needed for nausea or vomiting. What changed: when to take this   promethazine 25 MG suppository Commonly known as: PHENERGAN Place 1  suppository (25 mg total) rectally every 6 (six) hours as needed for nausea or vomiting. What changed: when to take this   sertraline 25 MG tablet Commonly known as: ZOLOFT Take 1 tablet (25 mg total) by mouth daily. What changed: when to take this   sucralfate 1 g tablet Commonly known as: Carafate Take 1 tablet (1 g total) by mouth every 4 (four) hours as needed. What changed:  when to take this reasons to take this               Durable Medical Equipment  (From admission, onward)  Start     Ordered   08/20/23 1719  DME Walker rolling  Once       Question:  Patient needs a walker to treat with the following condition  Answer:  History of open reduction and internal fixation (ORIF) procedure   08/20/23 1718   08/20/23 1719  DME 3 n 1  Once        08/20/23 1718   08/20/23 1719  DME Bedside commode  Once       Question:  Patient needs a bedside commode to treat with the following condition  Answer:  History of open reduction and internal fixation (ORIF) procedure   08/20/23 1718            Diagnostic Studies: DG FEMUR, MIN 2 VIEWS RIGHT  Result Date: 08/20/2023 CLINICAL DATA:  ProScan pre provided for right femur ORIF. EXAM: OPERATIVE RIGHT HIP AND FEMUR 9 VIEWS TECHNIQUE: Fluoroscopic spot image(s) were submitted for interpretation post-operatively. COMPARISON:  07/05/2023. FINDINGS: Images show placement an intramedullary rod, distal aspect in the distal femoral metaphysis. Rod supports 2 compression screws crossing the neck into the femoral head. Orthopedic hardware is well seated. IMPRESSION: 1. Imaging provided for right femur ORIF. Please refer to the procedure report for further details. Electronically Signed   By: Amie Portland M.D.   On: 08/20/2023 16:22   DG C-Arm 1-60 Min-No Report  Result Date: 08/20/2023 Fluoroscopy was utilized by the requesting physician.  No radiographic interpretation.    Disposition: Discharge disposition: 62-Rehab  Facility       Discharge Instructions     Call MD / Call 911   Complete by: As directed    If you experience chest pain or shortness of breath, CALL 911 and be transported to the hospital emergency room.  If you develope a fever above 101.5 F, pus (white drainage) or increased drainage or redness at the wound, or calf pain, call your surgeon's office.   Constipation Prevention   Complete by: As directed    Drink plenty of fluids.  Prune juice may be helpful.  You may use a stool softener, such as Colace (over the counter) 100 mg twice a day.  Use MiraLax (over the counter) for constipation as needed.   Driving restrictions   Complete by: As directed    No driving while taking narcotic pain meds.   Increase activity slowly as tolerated   Complete by: As directed    Post-operative opioid taper instructions:   Complete by: As directed    POST-OPERATIVE OPIOID TAPER INSTRUCTIONS: It is important to wean off of your opioid medication as soon as possible. If you do not need pain medication after your surgery it is ok to stop day one. Opioids include: Codeine, Hydrocodone(Norco, Vicodin), Oxycodone(Percocet, oxycontin) and hydromorphone amongst others.  Long term and even short term use of opiods can cause: Increased pain response Dependence Constipation Depression Respiratory depression And more.  Withdrawal symptoms can include Flu like symptoms Nausea, vomiting And more Techniques to manage these symptoms Hydrate well Eat regular healthy meals Stay active Use relaxation techniques(deep breathing, meditating, yoga) Do Not substitute Alcohol to help with tapering If you have been on opioids for less than two weeks and do not have pain than it is ok to stop all together.  Plan to wean off of opioids This plan should start within one week post op of your joint replacement. Maintain the same interval or time between taking each dose and first decrease the dose.  Cut the total  daily intake of opioids by one tablet each day Next start to increase the time between doses. The last dose that should be eliminated is the evening dose.           Follow-up Information     Cristie Hem, PA-C Follow up in 2 week(s).   Specialty: Orthopedic Surgery Why: For suture removal, For wound re-check Contact information: [address] [place] [address] [phone]                  Signed: Glee Arvin 08/23/2023, 3:30 PM

## 2023-08-23 NOTE — Telephone Encounter (Signed)
Left vm for verbal order.

## 2023-08-23 NOTE — Progress Notes (Signed)
Subjective: 3 Days Post-Op Procedure(s) (LRB): RIGHT INTRAMEDULLARY (IM) NAIL FEMORAL (Right) Patient reports pain as mild.    Objective: Vital signs in last 24 hours: Temp:  [98 F (36.7 C)-99 F (37.2 C)] 98 F (36.7 C) (12/09 1514) Pulse Rate:  [74-91] 80 (12/09 1514) Resp:  [16-17] 17 (12/09 1514) BP: (85-99)/(58-71) 99/71 (12/09 1514) SpO2:  [97 %-100 %] 100 % (12/09 1514)  Intake/Output from previous day: 12/08 0701 - 12/09 0700 In: 1192.4 [P.O.:720; I.V.:472.4] Out: -  Intake/Output this shift: No intake/output data recorded.  Recent Labs    08/22/23 1042  HGB 10.1*   Recent Labs    08/22/23 1042  WBC 7.6  RBC 2.87*  HCT 30.8*  PLT 109*   No results for input(s): "NA", "K", "CL", "CO2", "BUN", "CREATININE", "GLUCOSE", "CALCIUM" in the last 72 hours. No results for input(s): "LABPT", "INR" in the last 72 hours.  Neurologically intact Neurovascular intact Sensation intact distally Intact pulses distally Dorsiflexion/Plantar flexion intact Incision: scant drainage No cellulitis present Compartment soft   Assessment/Plan: 3 Days Post-Op Procedure(s) (LRB): RIGHT INTRAMEDULLARY (IM) NAIL FEMORAL (Right) Up with therapy WBAT RLE ABLA- mild and stable D/C to CIR     Cristie Hem 08/23/2023, 3:49 PM

## 2023-08-23 NOTE — Progress Notes (Signed)
PMR Admission Coordinator Pre-Admission Assessment   Patient: Denise [name] is an 72 y.o., female MRN: [id number] DOB: April 05, 1951 Height: 5\' 5"  (165.1 cm) Weight: 72.6 kg   Insurance Information HMO:     PPO:      PCP:      IPA:      80/20: yes     OTHER:  PRIMARY: Medicare AB      Policy#: [id number]        Subscriber:  CM Name:       Phone#:      Fax#:  Pre-Cert#: verified online      Employer:  Benefits:  Phone #:      Name:  Eff. Date: Part A effective 04/14/2014, B effective 09/14/2014    Deduct: $1632      Out of Pocket Max: n/a      Life Max: n/a CIR: 100%      SNF: 20 full days Outpatient:      Co-Pay:  Home Health: 100%      Co-Pay:  DME:      Co-Pay:  Providers:  SECONDARY: TEFL teacher Supplement      Policy#: [id number]      Phone#:    Artist:       Phone#:    The "Data Collection Information Summary" for patients in Inpatient Rehabilitation Facilities with attached "Privacy Act Statement-Health Care Records" was provided and verbally reviewed with: Pt   Emergency Contact Information Contact Information       Name Relation Home Work Mobile    nelson,julie Daughter     [phone]         Other Contacts   None on File        Current Medical History  Patient Admitting Diagnosis: Lytic lesions of femur History of Present Illness:  Pt. Is a 72 year old female with a past medical history of metastatic breast cancer. She was found to have lytic lesions of the right femur and presented to Ortho service at [facility] on 08/20/23 for prophylactic intramedullary nail of right femur. She was noted to have some ABL anemia and leukocytosis post operatively. She was seen by PT/OT and they recommend CIR to assist return to PLOF.   Patient's medical record from [facility] has been reviewed by the rehabilitation admission coordinator and physician.   Past Medical History      Past Medical History:  Diagnosis Date   Asthma       as child   Cancer (HCC) 12/2020    left breast IMC   Complication of anesthesia      pateitn states,' I coded when I had my D&Cmany years ago.   Dyspnea     Family history of breast cancer     Hypertension     Hypothyroidism     PONV (postoperative nausea and vomiting)     Port-A-Cath in place 02/26/2021          Has the patient had major surgery during 100 days prior to admission? Yes   Family History   family history includes Breast cancer in her cousin and mother; Cancer in her maternal uncle and paternal uncle; Heart disease in her maternal grandfather, mother, paternal grandfather, and paternal grandmother; Lung cancer in her father; Thyroid disease in her daughter, daughter, maternal aunt, maternal grandmother, and mother; Thyroid nodules (age of onset: 73) in her maternal grandmother.   Current Medications  Current Medications    Current Facility-Administered  Medications:    0.9 %  sodium chloride infusion, , Intravenous, Continuous, Tarry Kos, MD, Last Rate: 25 mL/hr at 08/21/23 2304, New Bag at 08/21/23 2304   acetaminophen (TYLENOL) tablet 325-650 mg, 325-650 mg, Oral, Q6H PRN, Tarry Kos, MD, 650 mg at 08/22/23 0050   aspirin chewable tablet 81 mg, 81 mg, Oral, BID, Tarry Kos, MD, 81 mg at 08/22/23 4696   diphenhydrAMINE (BENADRYL) 12.5 MG/5ML elixir 25 mg, 25 mg, Oral, Q4H PRN, Tarry Kos, MD   docusate sodium (COLACE) capsule 100 mg, 100 mg, Oral, BID, Tarry Kos, MD, 100 mg at 08/22/23 2952   gabapentin (NEURONTIN) capsule 300 mg, 300 mg, Oral, QHS, Tarry Kos, MD, 300 mg at 08/21/23 2053   hydrALAZINE (APRESOLINE) tablet 25 mg, 25 mg, Oral, TID PRN, Tarry Kos, MD   irbesartan (AVAPRO) tablet 300 mg, 300 mg, Oral, Daily PRN **AND** hydrochlorothiazide (HYDRODIURIL) tablet 25 mg, 25 mg, Oral, Daily PRN, Tarry Kos, MD   HYDROmorphone (DILAUDID) injection 0.5-1 mg, 0.5-1 mg, Intravenous, Q4H PRN, Tarry Kos, MD, 1 mg at 08/20/23 2028    levothyroxine (SYNTHROID) tablet 75 mcg, 75 mcg, Oral, QAC breakfast, Tarry Kos, MD, 75 mcg at 08/22/23 0510   magnesium citrate solution 1 Bottle, 1 Bottle, Oral, Once PRN, Tarry Kos, MD   methocarbamol (ROBAXIN) tablet 500 mg, 500 mg, Oral, Q6H PRN, 500 mg at 08/21/23 1800 **OR** methocarbamol (ROBAXIN) injection 500 mg, 500 mg, Intravenous, Q6H PRN, Tarry Kos, MD   metoCLOPramide (REGLAN) tablet 5-10 mg, 5-10 mg, Oral, Q8H PRN **OR** metoCLOPramide (REGLAN) injection 5-10 mg, 5-10 mg, Intravenous, Q8H PRN, Roda Shutters, Edwin Cap, MD   ondansetron (ZOFRAN) tablet 4 mg, 4 mg, Oral, Q6H PRN **OR** ondansetron (ZOFRAN) injection 4 mg, 4 mg, Intravenous, Q6H PRN, Tarry Kos, MD   oxyCODONE (Oxy IR/ROXICODONE) immediate release tablet 10-15 mg, 10-15 mg, Oral, Q4H PRN, Tarry Kos, MD, 15 mg at 08/22/23 1151   oxyCODONE (Oxy IR/ROXICODONE) immediate release tablet 5-10 mg, 5-10 mg, Oral, Q4H PRN, Tarry Kos, MD   polyethylene glycol (MIRALAX / GLYCOLAX) packet 17 g, 17 g, Oral, Daily PRN, Tarry Kos, MD   sorbitol 70 % solution 30 mL, 30 mL, Oral, Daily PRN, Tarry Kos, MD     Patients Current Diet:  Diet Order                  Diet Carb Modified Fluid consistency: Thin; Room service appropriate? Yes  Diet effective now                         Precautions / Restrictions Precautions Precautions: Fall Restrictions Weight Bearing Restrictions: Yes RLE Weight Bearing: Weight bearing as tolerated    Has the patient had 2 or more falls or a fall with injury in the past year? No   Prior Activity Level Community (5-7x/wk): Pt. was working and active int he community PTA   Prior Functional Level Self Care: Did the patient need help bathing, dressing, using the toilet or eating? Independent   Indoor Mobility: Did the patient need assistance with walking from room to room (with or without device)? Independent   Stairs: Did the patient need assistance with internal or  external stairs (with or without device)? Independent   Functional Cognition: Did the patient need help planning regular tasks such as shopping or remembering to take medications? Independent   Patient Information  Are you of Hispanic, Latino/a,or Spanish origin?: A. No, not of Hispanic, Latino/a, or Spanish origin What is your race?: A. White Do you need or want an interpreter to communicate with a doctor or health care staff?: 0. No   Patient's Response To:  Health Literacy and Transportation Is the patient able to respond to health literacy and transportation needs?: Yes Health Literacy - How often do you need to have someone help you when you read instructions, pamphlets, or other written material from your doctor or pharmacy?: Never In the past 12 months, has lack of transportation kept you from medical appointments or from getting medications?: No In the past 12 months, has lack of transportation kept you from meetings, work, or from getting things needed for daily living?: No   Journalist, newspaper / Equipment Home Equipment: Information systems manager, Rollator (4 wheels), Agricultural consultant (2 wheels), [employer] - single point, Grab bars - tub/shower, Grab bars - toilet   Prior Device Use: Indicate devices/aids used by the patient prior to current illness, exacerbation or injury? None of the above   Current Functional Level Cognition   Overall Cognitive Status: Within Functional Limits for tasks assessed Orientation Level: Oriented X4    Extremity Assessment (includes Sensation/Coordination)   Upper Extremity Assessment: Overall WFL for tasks assessed  Lower Extremity Assessment: RLE deficits/detail RLE Deficits / Details: Able to perform LAQ with mild lag, limited hip flexion     ADLs         Mobility   Overal bed mobility: Needs Assistance Bed Mobility: Supine to Sit Supine to sit: Min assist General bed mobility comments: Cues for technique, use of bed rail, assits for RLE negotiation out  of the bed and utilization of bed pad to scoot hips out to edge     Transfers   Overall transfer level: Needs assistance Equipment used: Rolling walker (2 wheels) Transfers: Sit to/from Stand, Bed to chair/wheelchair/BSC Sit to Stand: Min assist, Mod assist Bed to/from chair/wheelchair/BSC transfer type:: Stand pivot Stand pivot transfers: Min assist General transfer comment: MinA from edge of bed and pivoting towards left to [facility]. ModA from bedside commode to power up to standing and initially steady.     Ambulation / Gait / Stairs / Wheelchair Mobility   Ambulation/Gait Ambulation/Gait assistance: Editor, commissioning (Feet): 3 Feet Assistive device: Rolling walker (2 wheels) Gait Pattern/deviations: Step-to pattern, Decreased stance time - right, Decreased weight shift to right General Gait Details: Verbal cues for sequencing/technique, ambulating from [facility] to chair. Cues for walker use and intermittent steering required. Reminders to back up to chair and BSC prior to sitting pre-emptively Gait velocity: decreased Gait velocity interpretation: <1.8 ft/sec, indicate of risk for recurrent falls     Posture / Balance Balance Overall balance assessment: Needs assistance Sitting-balance support: Feet supported Sitting balance-Leahy Scale: Fair Standing balance support: Bilateral upper extremity supported Standing balance-Leahy Scale: Poor     Special needs/care consideration Skin surgical incision  and Special service needs orthostasis    Previous Home Environment (from acute therapy documentation) Living Arrangements: Alone  Lives With: Spouse Available Help at Discharge: Family (daughter) Type of Home: House Home Layout: One level Home Access: Level entry Bathroom Shower/Tub: Health visitor: Standard Bathroom Accessibility: Yes How Accessible: Accessible via walker, Accessible via wheelchair Home Care Services: No   Discharge Living Setting Plans for  Discharge Living Setting: Patient's home Type of Home at Discharge: House Discharge Home Layout: One level Discharge Home Access: Level entry Discharge  Bathroom Shower/Tub: Garment/textile technologist: Standard Discharge Bathroom Accessibility: Yes How Accessible: Accessible via walker, Accessible via wheelchair Does the patient have any problems obtaining your medications?: No   Social/Family/Support Systems Patient Roles: Other (Comment) Contact Information: [phone] Anticipated Caregiver: Daughter Drexel Iha Anticipated Caregiver's Contact Information: Pt. wants to return home independenly but daughter can stay with her 24/7 if needed Caregiver Availability: 24/7 Discharge Plan Discussed with Primary Caregiver: Yes Is Caregiver In Agreement with Plan?: Yes   Goals Patient/Family Goal for Rehab: PT/OT mod I Expected length of stay: 10-12 days Pt/Family Agrees to Admission and willing to participate: Yes Program Orientation Provided & Reviewed with Pt/Caregiver Including Roles  & Responsibilities: Yes   Decrease burden of Care through IP rehab admission: not anticipated   Possible need for SNF placement upon discharge: not anticipated   Patient Condition: I have reviewed medical records from [facility], spoken with CM, and patient and daughter. I met with patient at the bedside for inpatient rehabilitation assessment.  Patient will benefit from ongoing PT and OT, can actively participate in 3 hours of therapy a day 5 days of the week, and can make measurable gains during the admission.  Patient will also benefit from the coordinated team approach during an Inpatient Acute Rehabilitation admission.  The patient will receive intensive therapy as well as Rehabilitation physician, nursing, social worker, and care management interventions.  Due to safety, skin/wound care, disease management, medication administration, pain management, and patient education  the patient requires 24 hour a day rehabilitation nursing.  The patient is currently mod A  with mobility and basic ADLs.  Discharge setting and therapy post discharge at home with home health is anticipated.  Patient has agreed to participate in the Acute Inpatient Rehabilitation Program and will admit today.   Preadmission Screen Completed By:  Jeronimo Greaves, 08/22/2023 2:09 PM ______________________________________________________________________   Discussed status with Dr. Riley Kill on 08/23/23 at 930 and received approval for admission today.   Admission Coordinator:  Jeronimo Greaves, CCC-SLP, time 0981 /Date 08/23/23    Assessment/Plan: Diagnosis:Right hip metastasis Does the need for close, 24 hr/day Medical supervision in concert with the patient's rehab needs make it unreasonable for this patient to be served in a less intensive setting? Yes Co-Morbidities requiring supervision/potential complications: Metastatic breast carcinoma, chemo induced peripheral neuropathy, dysphagia,acute post operative pain, hypertension  Due to bladder management, bowel management, safety, skin/wound care, disease management, medication administration, pain management, and patient education, does the patient require 24 hr/day rehab nursing? Yes Does the patient require coordinated care of a physician, rehab nurse, PT, OT, and SLP to address physical and functional deficits in the context of the above medical diagnosis(es)? Yes Addressing deficits in the following areas: balance, endurance, locomotion, strength, transferring, bowel/bladder control, bathing, dressing, feeding, grooming, toileting, swallowing, and psychosocial support Can the patient actively participate in an intensive therapy program of at least 3 hrs of therapy 5 days a week? Yes The potential for patient to make measurable gains while on inpatient rehab is fair Anticipated functional outcomes upon discharge from inpatient rehab: supervision PT,  supervision OT, modified independent SLP Estimated rehab length of stay to reach the above functional goals is: 10-12d Anticipated discharge destination: Home 10. Overall Rehab/Functional Prognosis: fair     MD Signature: Erick Colace M.D. Outpatient [facility] Fellow Am Acad of Phys Med and Rehab Diplomate Am Board of Electrodiagnostic Med Fellow Am Board of Interventional Pain

## 2023-08-23 NOTE — Progress Notes (Signed)
Mobility Specialist: Progress Note   08/23/23 1445  Mobility  Activity Transferred from chair to bed  Level of Assistance Contact guard assist, steadying assist  Assistive Device Front wheel walker  RLE Weight Bearing WBAT  Activity Response Tolerated well  Mobility Referral Yes  Mobility visit 1 Mobility  Mobility Specialist Start Time (ACUTE ONLY) 1325  Mobility Specialist Stop Time (ACUTE ONLY) 1335  Mobility Specialist Time Calculation (min) (ACUTE ONLY) 10 min    Pt was agreeable to mobility session - received in chair. MinA for STS, minG for ambulation, minA for bed mobility. No complaints. Left in bed with all needs met, call bell in reach.   Maurene Capes Mobility Specialist Please contact via SecureChat or Rehab office at [phone]

## 2023-08-24 ENCOUNTER — Inpatient Hospital Stay (HOSPITAL_COMMUNITY): Payer: Medicare Other

## 2023-08-24 ENCOUNTER — Other Ambulatory Visit (HOSPITAL_COMMUNITY): Payer: Self-pay

## 2023-08-24 DIAGNOSIS — M899 Disorder of bone, unspecified: Secondary | ICD-10-CM | POA: Diagnosis not present

## 2023-08-24 LAB — COMPREHENSIVE METABOLIC PANEL
ALT: 8 U/L (ref 0–44)
AST: 24 U/L (ref 15–41)
Albumin: 2.3 g/dL — ABNORMAL LOW (ref 3.5–5.0)
Alkaline Phosphatase: 57 U/L (ref 38–126)
Anion gap: 5 (ref 5–15)
BUN: 20 mg/dL (ref 8–23)
CO2: 22 mmol/L (ref 22–32)
Calcium: 7.7 mg/dL — ABNORMAL LOW (ref 8.9–10.3)
Chloride: 104 mmol/L (ref 98–111)
Creatinine, Ser: 0.63 mg/dL (ref 0.44–1.00)
GFR, Estimated: >60 mL/min (ref >60)
Glucose, Bld: 67 mg/dL — ABNORMAL LOW (ref 70–99)
Potassium: 4.5 mmol/L (ref 3.5–5.1)
Sodium: 131 mmol/L — ABNORMAL LOW (ref 135–145)
Total Bilirubin: 0.5 mg/dL (ref <1.2)
Total Protein: 5 g/dL — ABNORMAL LOW (ref 6.5–8.1)

## 2023-08-24 LAB — CBC WITH DIFFERENTIAL/PLATELET
Abs Immature Granulocytes: 0.08 10*3/uL — ABNORMAL HIGH (ref 0.00–0.07)
Basophils Absolute: 0 10*3/uL (ref 0.0–0.1)
Basophils Relative: 0 %
Eosinophils Absolute: 0.2 10*3/uL (ref 0.0–0.5)
Eosinophils Relative: 3 %
HCT: 28 % — ABNORMAL LOW (ref 36.0–46.0)
Hemoglobin: 9.1 g/dL — ABNORMAL LOW (ref 12.0–15.0)
Immature Granulocytes: 1 %
Lymphocytes Relative: 14 %
Lymphs Abs: 1 10*3/uL (ref 0.7–4.0)
MCH: 35 pg — ABNORMAL HIGH (ref 26.0–34.0)
MCHC: 32.5 g/dL (ref 30.0–36.0)
MCV: 107.7 fL — ABNORMAL HIGH (ref 80.0–100.0)
Monocytes Absolute: 0.5 10*3/uL (ref 0.1–1.0)
Monocytes Relative: 7 %
Neutro Abs: 5.3 10*3/uL (ref 1.7–7.7)
Neutrophils Relative %: 75 %
Platelets: 123 10*3/uL — ABNORMAL LOW (ref 150–400)
RBC: 2.6 MIL/uL — ABNORMAL LOW (ref 3.87–5.11)
RDW: 13.1 % (ref 11.5–15.5)
WBC: 7.1 10*3/uL (ref 4.0–10.5)
nRBC: 0 % (ref 0.0–0.2)

## 2023-08-24 LAB — VITAMIN B12: Vitamin B-12: 1208 pg/mL — ABNORMAL HIGH (ref 180–914)

## 2023-08-24 MED ORDER — DEXAMETHASONE 4 MG PO TABS
4.0000 mg | ORAL_TABLET | Freq: Two times a day (BID) | ORAL | Status: DC
  Administered 2023-08-24 – 2023-08-31 (×15): 4 mg via ORAL
  Filled 2023-08-24 (×15): qty 1

## 2023-08-24 MED ORDER — TOPIRAMATE 25 MG PO TABS
25.0000 mg | ORAL_TABLET | Freq: Every day | ORAL | Status: DC
  Administered 2023-08-24 – 2023-08-25 (×2): 25 mg via ORAL
  Filled 2023-08-24 (×2): qty 1

## 2023-08-24 MED ORDER — HYDROCHLOROTHIAZIDE 12.5 MG PO TABS: 12.5000 mg | ORAL_TABLET | Freq: Every day | ORAL | Status: DC | PRN

## 2023-08-24 MED ORDER — B COMPLEX-C PO TABS
1.0000 | ORAL_TABLET | Freq: Every day | ORAL | Status: DC
  Administered 2023-08-24 – 2023-08-31 (×8): 1 via ORAL
  Filled 2023-08-24 (×8): qty 1

## 2023-08-24 MED ORDER — PANTOPRAZOLE SODIUM 40 MG PO TBEC
40.0000 mg | DELAYED_RELEASE_TABLET | Freq: Two times a day (BID) | ORAL | Status: DC
  Administered 2023-08-24 – 2023-08-31 (×15): 40 mg via ORAL
  Filled 2023-08-24 (×15): qty 1

## 2023-08-24 MED ORDER — BUTALBITAL-APAP-CAFFEINE 50-325-40 MG PO TABS
1.0000 | ORAL_TABLET | Freq: Once | ORAL | Status: AC
  Administered 2023-08-24: 1 via ORAL
  Filled 2023-08-24: qty 1

## 2023-08-24 MED ORDER — IRBESARTAN 300 MG PO TABS: 300.0000 mg | ORAL_TABLET | Freq: Every day | ORAL | Status: DC | PRN

## 2023-08-24 MED ORDER — SERTRALINE HCL 50 MG PO TABS
25.0000 mg | ORAL_TABLET | Freq: Every day | ORAL | Status: DC
  Administered 2023-08-24 – 2023-08-30 (×7): 25 mg via ORAL
  Filled 2023-08-24 (×7): qty 1

## 2023-08-24 MED ORDER — VITAMIN D 25 MCG (1000 UNIT) PO TABS
1000.0000 [IU] | ORAL_TABLET | Freq: Every day | ORAL | Status: DC
Start: 2023-08-24 — End: 2023-08-31
  Administered 2023-08-24 – 2023-08-31 (×8): 1000 [IU] via ORAL
  Filled 2023-08-24 (×8): qty 1

## 2023-08-24 NOTE — Progress Notes (Signed)
PROGRESS NOTE   Subjective/Complaints: Lightheaded: d/c antihypertensives since BP is soft Hgb dropped- stool occult ordered and repeat CBC ordered for tomorrow  ROS: +lightheadedness   Objective:   No results found. Recent Labs    08/22/23 1042 08/24/23 0609  WBC 7.6 7.1  HGB 10.1* 9.1*  HCT 30.8* 28.0*  PLT 109* 123*   Recent Labs    08/24/23 0609  NA 131*  K 4.5  CL 104  CO2 22  GLUCOSE 67*  BUN 20  CREATININE 0.63  CALCIUM 7.7*    Intake/Output Summary (Last 24 hours) at 08/24/2023 1120 Last data filed at 08/24/2023 0800 Gross per 24 hour  Intake 540 ml  Output --  Net 540 ml        Physical Exam: Vital Signs Blood pressure 102/71, pulse 78, temperature 98.6 F (37 C), temperature source Oral, resp. rate 18, SpO2 98%. Skin:    Comments: Right hip incision dressed.  Appropriately tender  Neurological:     Comments: Patient is alert oriented x 3 and follows commands.      General: No acute distress Mood and affect are appropriate Heart: Regular rate and rhythm no rubs murmurs or extra sounds, +orthostasis Lungs: Clear to auscultation, breathing unlabored, no rales or wheezes Abdomen: Positive bowel sounds, soft nontender to palpation, nondistended Extremities: No clubbing, cyanosis, or edema Skin: No evidence of breakdown, no evidence of rash Neurologic: mildly delayed responses with mild dysarthria motor strength is 5/5 in bilateral deltoid, bicep, tricep, grip, hip flexor, 4/5 left and trace right knee extensors, 5/5 B ankle dorsiflexor and plantar flexor Sensory exam normal sensation to light touch  in bilateral upper and lower extremities   Musculoskeletal: Full range of motion in BUE No joint swelling Post op Right thigh edema with ecchymosis posterior to hip incisions Fullness and mild tenderness Right popliteal fossa     Assessment/Plan: 1. Functional deficits which require 3+  hours per day of interdisciplinary therapy in a comprehensive inpatient rehab setting. Physiatrist is providing close team supervision and 24 hour management of active medical problems listed below. Physiatrist and rehab team continue to assess barriers to discharge/monitor patient progress toward functional and medical goals  Care Tool:  Bathing              Bathing assist       Upper Body Dressing/Undressing Upper body dressing        Upper body assist      Lower Body Dressing/Undressing Lower body dressing            Lower body assist       Toileting Toileting    Toileting assist       Transfers Chair/bed transfer  Transfers assist     Chair/bed transfer assist level: Minimal Assistance - Patient > 75%     Locomotion Ambulation   Ambulation assist   Ambulation activity did not occur: Safety/medical concerns (lightheadedness, nausea, headache)          Walk 10 feet activity   Assist  Walk 10 feet activity did not occur: Safety/medical concerns (lightheadedness, nausea, headache)        Walk 50 feet activity  Assist Walk 50 feet with 2 turns activity did not occur: Safety/medical concerns (lightheadedness, nausea, headache)         Walk 150 feet activity   Assist Walk 150 feet activity did not occur: Safety/medical concerns (lightheadedness, nausea, headache)         Walk 10 feet on uneven surface  activity   Assist Walk 10 feet on uneven surfaces activity did not occur: Safety/medical concerns (lightheadedness, nausea, headache)         Wheelchair     Assist Is the patient using a wheelchair?: Yes Type of Wheelchair: Manual    Wheelchair assist level: Dependent - Patient 0% Max wheelchair distance: >340ft    Wheelchair 50 feet with 2 turns activity    Assist        Assist Level: Dependent - Patient 0%   Wheelchair 150 feet activity     Assist      Assist Level: Dependent - Patient 0%    Blood pressure 102/71, pulse 78, temperature 98.6 F (37 C), temperature source Oral, resp. rate 18, SpO2 98%.  Medical Problem List and Plan: 1. Functional deficits secondary to metastatic breast cancer with metastasis to right femur.  Status post prophylactic intramedullary nail of right femur for right femur lytic lesions 08/20/2023.  Weightbearing as tolerated             -patient may  shower starting 12/10             -ELOS/Goals: 10-12d  D3 ordered 2.  Antithrombotics: -DVT/anticoagulation:  Mechanical: Antiembolism stockings, thigh (TED hose) Bilateral lower extremities, check doppler LEs             -antiplatelet therapy: Aspirin 81 mg twice daily 3. Headache: Fioricet ordered x1, topamax ordered to replace gabapentin HS  4. Mood/Behavior/Sleep: Provide emotional support             -antipsychotic agents: N/A 5. Neuropsych/cognition: This patient is capable of making decisions on her own behalf. 6. Skin/Wound Care: Routine skin checks 7. Fluids/Electrolytes/Nutrition: Routine in and outs with follow-up chemistries 8.  Acute blood loss anemia.  Follow-up CBC 9.  Hypothyroidism.  Synthroid  10.  Orthostasis:  D/c prn anti-hypertensives, teds ordered   11.  Peripheral neuropathy due to chemotherapy   12.  Macrocytic anemia: B12 reviewed and elevated, will add oral B complex  13.  Hx metastatic brain lesion s/p radiation therapy Oct 2023  14.  Dysphagia like due to radiation therapy D3 diet   LOS: 1 days A FACE TO FACE EVALUATION WAS PERFORMED  Andreah Goheen P Stefany Starace 08/24/2023, 11:20 AM

## 2023-08-24 NOTE — Evaluation (Signed)
Occupational Therapy Assessment and Plan  Patient Details  Name: Denise [name] MRN: [id number] Date of Birth: [birth year]  OT Diagnosis: abnormal posture, acute pain, disturbance of vision, muscle weakness (generalized), and pain in joint Rehab Potential: Rehab Potential (ACUTE ONLY): Fair ELOS: 10-12 days   Today's Date: 08/24/2023 OT Individual Time: 1105-1200 OT Individual Time Calculation (min): 55 min     Hospital Problem: Principal Problem:   Lytic bone lesion of right femur   Past Medical History:  Past Medical History:  Diagnosis Date   Asthma    as child   Cancer (HCC) 12/2020   left breast IMC   Complication of anesthesia    pateitn states,' I coded when I had my D&Cmany years ago.   Dyspnea    Family history of breast cancer    Hypertension    Hypothyroidism    PONV (postoperative nausea and vomiting)    Port-A-Cath in place 02/26/2021   Past Surgical History:  Past Surgical History:  Procedure Laterality Date   ABDOMINAL HYSTERECTOMY     BIOPSY  01/17/2018   Procedure: BIOPSY;  Surgeon: Malissa Hippo, MD;  [facility]: [facility];  Service: Endoscopy;;  duodenum,gastric   CHOLECYSTECTOMY     COLONOSCOPY WITH PROPOFOL N/A 01/17/2018   Procedure: COLONOSCOPY WITH PROPOFOL;  Surgeon: Malissa Hippo, MD;  Location: [facility];  Service: Endoscopy;  Laterality: N/A;  7:30   DILATION AND CURETTAGE OF UTERUS     ESOPHAGOGASTRODUODENOSCOPY (EGD) WITH PROPOFOL N/A 01/17/2018   Procedure: ESOPHAGOGASTRODUODENOSCOPY (EGD) WITH PROPOFOL;  Surgeon: Malissa Hippo, MD;  [facility]: [facility];  Service: Endoscopy;  Laterality: N/A;   POLYPECTOMY  01/17/2018   Procedure: POLYPECTOMY;  Surgeon: Malissa Hippo, MD;  Location: [facility];  Service: Endoscopy;;  transverse colon, cecal   PORTACATH PLACEMENT Right 02/17/2021   Procedure: INSERTION PORT-A-CATH;  Surgeon: Abigail Miyamoto, MD;  Location: [facility];  Service: General;  Laterality: Right;    RADIOACTIVE SEED GUIDED AXILLARY SENTINEL LYMPH NODE Left 11/20/2021   Procedure: RADIOACTIVE SEED GUIDED LEFT AXILLARY SENTINEL LYMPH NODE DISSECTION;  Surgeon: Abigail Miyamoto, MD;  Location: [facility];  Service: General;  Laterality: Left;   TOTAL MASTECTOMY Left 11/20/2021   Procedure: LEFT TOTAL MASTECTOMY;  Surgeon: Abigail Miyamoto, MD;  Location: [facility];  Service: General;  Laterality: Left;    Assessment & Plan Clinical Impression:  Denise [name] is a 72 year old right-handed female with history of hypertension, hypothyroidism, history of metastatic breast cancer with left total mastectomy 11/20/2021 as well as metastasis to the right femur followed by oncology services DrKatragadda. Per chart review patient lives alone. 1 level home with a level entry. 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 Presented 08/20/2023 per orthopedic services Dr.Xu for surgical treatment of right femur lytic lesions. Underwent prophylactic intramedullary nail of right femur 08/20/2023. Weightbearing as tolerated right lower extremity. Acute blood loss anemia 10.1 and monitored. Blood pressure remained soft postoperatively and monitored. She was placed on aspirin 81 mg twice daily for DVT prophylaxis. Therapy evaluations completed due to patient's decreased functional mobility was admitted for a comprehensive rehab program. Patient transferred to CIR on 08/23/2023 .    Patient currently requires min-mod with basic self-care skills secondary to muscle weakness, decreased cardiorespiratoy endurance, decreased coordination, and decreased standing balance.  Prior to hospitalization, patient could complete ADLs independently.  Patient will benefit from skilled intervention to  decrease level of assist with basic self-care skills and increase independence with basic self-care skills prior to discharge home independently.  Anticipate  patient will require intermittent supervision and follow up home health.  OT - End of Session Activity Tolerance: Tolerates < 10 min activity, no significant change in vital signs Endurance Deficit: Yes Endurance Deficit Description: pt required frequent breaks due to feeling lightheaded and nauseous OT Assessment Rehab Potential (ACUTE ONLY): Fair OT Barriers to Discharge: Pending chemo/radiation;Home environment access/layout;Decreased caregiver support;Nutrition means OT Patient demonstrates impairments in the following area(s): Balance;Edema;Endurance;Motor;Pain;Vision;Nutrition OT Basic ADL's Functional Problem(s): Bathing;Dressing;Toileting OT Advanced ADL's Functional Problem(s): Simple Meal Preparation OT Transfers Functional Problem(s): Toilet;Tub/Shower OT Additional Impairment(s): None OT Plan OT Intensity: Minimum of 1-2 x/day, 45 to 90 minutes OT Frequency: 5 out of 7 days OT Duration/Estimated Length of Stay: 10-12 days OT Treatment/Interventions: Balance/vestibular training;Discharge planning;Pain management;Self Care/advanced ADL retraining;Therapeutic Activities;UE/LE Coordination activities;Disease mangement/prevention;Functional mobility training;Patient/family education;Skin care/wound managment;Therapeutic Exercise;Visual/perceptual remediation/compensation;DME/adaptive equipment instruction;Neuromuscular re-education;Psychosocial support;UE/LE Strength taining/ROM;Wheelchair propulsion/positioning OT Self Feeding Anticipated Outcome(s): Independent OT Basic Self-Care Anticipated Outcome(s): Supervision to mod I OT Toileting Anticipated Outcome(s): Mod I OT Bathroom Transfers Anticipated Outcome(s): Mod I OT Recommendation Recommendations for Other Services: Neuropsych consult;Therapeutic Recreation consult Therapeutic Recreation Interventions: Pet therapy Patient destination: Home Follow Up Recommendations: Home health OT Equipment Recommended: 3 in 1 bedside  comode;To be determined   OT Evaluation Precautions/Restrictions  Precautions Precautions: Fall Precaution Comments: active cancer wtih chemo port, monitor BP Restrictions Weight Bearing Restrictions: No RLE Weight Bearing: Weight bearing as tolerated Home Living/Prior Functioning Home Living Family/patient expects to be discharged to:: Private residence Living Arrangements: Alone Available Help at Discharge: Family, Available PRN/intermittently (daughter available at night) Type of Home: House Home Access: Level entry Home Layout: One level Bathroom Shower/Tub: Psychologist, counselling, Designer, industrial/product (built in Brewing technologist) Firefighter: Administrator Accessibility: Yes Additional Comments: has RW, rollator, and SPC but was not using AD prior to admission  Lives With: Alone IADL History Homemaking Responsibilities: Yes Meal Prep Responsibility: Primary Current License: Yes Mode of Transportation: Car Occupation: Part time employment 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 Leisure and Hobbies: Teaching, coloring Prior Function Level of Independence: Independent with basic ADLs, Independent with transfers, Independent with homemaking with ambulation, Independent with gait  Able to Take Stairs?: Yes Driving: Yes Vocation: Part time employment Gaffer: Armed forces technical officer at [employer] Baseline Vision/History: 1 Wears glasses;4 Cataracts Ability to See in Adequate Light: 1 Impaired Patient Visual Report: Blurring of vision Vision Assessment?: Vision impaired- to be further tested in functional context Additional Comments: With glasses, pt able to read words but vision is grossly blurry and pt reports vision has worsened since last radiation treatment. R eye worse than L Perception  Perception: Impaired Praxis Praxis: WFL Cognition Cognition Overall Cognitive Status: Within Functional Limits for  tasks assessed Arousal/Alertness: Awake/alert Orientation Level: Person;Place;Situation Person: Oriented Place: Oriented Situation: Oriented Memory: Appears intact Awareness: Appears intact Problem Solving: Appears intact Safety/Judgment: Appears intact Brief Interview for Mental Status (BIMS) Repetition of Three Words (First Attempt): 3 Temporal Orientation: Year: Correct Temporal Orientation: Month: Accurate within 5 days Temporal Orientation: Day: Correct Recall: "Sock": Yes, no cue required Recall: "Blue": Yes, no cue required Recall: "Bed": Yes, no cue required BIMS Summary Score: 15 Sensation Sensation Light Touch: Appears Intact Hot/Cold: Not tested Proprioception: Appears Intact Stereognosis: Not tested Additional Comments: pt reports numbness along bilateral toes Coordination Gross Motor Movements are Fluid and Coordinated: Yes (  generalized weakness/deconditioning and pain) Fine Motor Movements are Fluid and Coordinated: Yes Finger Nose Finger Test: [facility] bilaterally Heel Shin Test: unable to perform on RLE and significantly decreased ROM on LLE Motor  Motor Motor: Within Functional Limits  Trunk/Postural Assessment  Cervical Assessment Cervical Assessment: Exceptions to [facility] (forward head) Thoracic Assessment Thoracic Assessment: Exceptions to [facility] (kyphosis) Lumbar Assessment Lumbar Assessment: Within Functional Limits Postural Control Postural Control: Within Functional Limits  Balance Balance Balance Assessed: Yes Static Sitting Balance Static Sitting - Balance Support: Feet supported;Bilateral upper extremity supported Static Sitting - Level of Assistance: 5: Stand by assistance (supervision) Dynamic Sitting Balance Dynamic Sitting - Balance Support: Feet supported;No upper extremity supported Dynamic Sitting - Level of Assistance: 5: Stand by assistance (supervision) Static Standing Balance Static Standing - Balance Support: Bilateral upper extremity  supported;During functional activity (RW) Static Standing - Level of Assistance: 5: Stand by assistance (CGA) Dynamic Standing Balance Dynamic Standing - Balance Support: Bilateral upper extremity supported;During functional activity (RW) Dynamic Standing - Level of Assistance: 4: Min assist Dynamic Standing - Comments: during transfers Extremity/Trunk Assessment RUE Assessment RUE Assessment: Exceptions to [facility] Active Range of Motion (AROM) Comments: WFL General Strength Comments: 3+/5 LUE Assessment LUE Assessment: Exceptions to [facility] Active Range of Motion (AROM) Comments: WFL General Strength Comments: 3+/5  Care Tool Care Tool Self Care Eating   Eating Assist Level: Set up assist    Oral Care    Oral Care Assist Level: Set up assist    Bathing   Body parts bathed by patient: Right arm;Left arm;Chest;Abdomen;Front perineal area;Buttocks;Right upper leg;Left upper leg;Face Body parts bathed by helper: Right lower leg;Left lower leg   Assist Level: Minimal Assistance - Patient > 75%    Upper Body Dressing(including orthotics)   What is the patient wearing?: Pull over shirt   Assist Level: Supervision/Verbal cueing    Lower Body Dressing (excluding footwear)   What is the patient wearing?: Pants Assist for lower body dressing: Minimal Assistance - Patient > 75%    Putting on/Taking off footwear   What is the patient wearing?: Non-skid slipper socks Assist for footwear: Dependent - Patient 0%       Care Tool Toileting Toileting activity   Assist for toileting: Moderate Assistance - Patient 50 - 74%     Care Tool Bed Mobility Roll left and right activity   Roll left and right assist level: Supervision/Verbal cueing    Sit to lying activity        Lying to sitting on side of bed activity   Lying to sitting on side of bed assist level: the ability to move from lying on the back to sitting on the side of the bed with no back support.: Supervision/Verbal cueing      Care Tool Transfers Sit to stand transfer   Sit to stand assist level: Moderate Assistance - Patient 50 - 74%    Chair/bed transfer   Chair/bed transfer assist level: Minimal Assistance - Patient > 75%     Toilet transfer   Assist Level: Minimal Assistance - Patient > 75%     Care Tool Cognition  Expression of Ideas and Wants Expression of Ideas and Wants: 4. Without difficulty (complex and basic) - expresses complex messages without difficulty and with speech that is clear and easy to understand  Understanding Verbal and Non-Verbal Content Understanding Verbal and Non-Verbal Content: 4. Understands (complex and basic) - clear comprehension without cues or repetitions   Memory/Recall Ability Memory/Recall Ability : Current  season;That he or she is in a hospital/hospital unit   Refer to Care Plan for Long Term Goals  SHORT TERM GOAL WEEK 1 OT Short Term Goal 1 (Week 1): Pt will complete sit > stand in prep for ADL with CGA using LRAD OT Short Term Goal 2 (Week 1): Pt will thread LB clothing with AE PRN, with supervision OT Short Term Goal 3 (Week 1): Pt will complete toilet transfer with CGA using LRAD OT Short Term Goal 4 (Week 1): Pt will improve endurance to sitting EOB/sink for at least 10 mins to promote ability to complete bathing at shower level  Recommendations for other services: Neuropsych and Therapeutic Recreation  Pet therapy   Skilled Therapeutic Intervention Patient received seated in w/c upon therapy arrival, reporting dizziness, headache and fatigue. Nurse present for medication needs. OT offered rest breaks, dimmed lights, and repositioning for pain reduction. Pt agreeable to participate in OT evaluation with goal of transferring back to bed. Education provided on OT purpose, therapy schedule, goals for therapy, and safety policy while in rehab.   Patient demonstrates functional endurance, dynamic balance, vision (blurred with glasses donned), BUE weakness and  gross motor coordination deficits resulting in difficulty completing BADL tasks without increased physical assist. Cues needed throughout for body positioning especially with RLE management however cognition appeared intact with pt responding appropriately to commands and questions and was accurate historian with her medical history. Pt is expressive that her main goal of IPR despite cancer prognosis is to be as independent as possible with self-care at home to be able to DC without burdening family.  Pt will benefit from skilled OT services to focus on mentioned deficits. See below for ADL and functional transfer performance. Completed stand pivot and short ambulatory transfers using RW with mod A, was able to void continently on BSC, and complete toileting steps and dressing at levels listed below. Pt required min A for bed mobility to return to bed for RLE management. Pt remained upright in bed at conclusion of session with bed alarm on and all needs met at end of session.  BP monitored during session with the following measures: -92/63 (knee TEDs already on); pt symptomatic -117/73 (after activity); pt symptomatic  ADL ADL Eating: Set up Where Assessed-Eating: Wheelchair Grooming: Setup (simulated due to pt limited on eval 2/2 dizziness/fatigue) Where Assessed-Grooming: Wheelchair Upper Body Bathing: Supervision/safety (simulated due to pt limited on eval 2/2 dizziness/fatigue) Where Assessed-Upper Body Bathing: Wheelchair Lower Body Bathing: Minimal assistance (simulated due to pt limited on eval 2/2 dizziness/fatigue) Where Assessed-Lower Body Bathing: Sitting at sink;Standing at sink Upper Body Dressing: Supervision/safety Where Assessed-Upper Body Dressing: Wheelchair Lower Body Dressing: Minimal assistance Where Assessed-Lower Body Dressing: Sitting at sink;Standing at sink Toileting: Minimal assistance Where Assessed-Toileting: Bedside Commode Toilet Transfer: Moderate  assistance Toilet Transfer Method: Proofreader: Bedside commode;Other (comment) (RW) Tub/Shower Transfer: Unable to assess Tub/Shower Transfer Method: Unable to assess Film/video editor: Unable to assess Film/video editor Method: Unable to assess Mobility  Bed Mobility Bed Mobility: Rolling Right;Supine to Sit Rolling Right: Supervision/verbal cueing (bedrails) Supine to Sit: Supervision/Verbal cueing (bedrails and HOB elevated) Transfers Sit to Stand: Moderate Assistance - Patient 50-74% Stand to Sit: Minimal Assistance - Patient > 75%   Discharge Criteria: Patient will be discharged from OT if patient refuses treatment 3 consecutive times without medical reason, if treatment goals not met, if there is a change in medical status, if patient makes no progress towards goals or if patient  is discharged from hospital.  The above assessment, treatment plan, treatment alternatives and goals were discussed and mutually agreed upon: by patient  Melvyn Novas, MS, OTR/L  08/24/2023, 12:26 PM

## 2023-08-24 NOTE — Plan of Care (Signed)
  Problem: Sit to Stand Goal: LTG:  Patient will perform sit to stand in prep for activites of daily living with assistance level (OT) Description: LTG:  Patient will perform sit to stand in prep for activites of daily living with assistance level (OT) Flowsheets (Taken 08/24/2023 1443) LTG: PT will perform sit to stand in prep for activites of daily living with assistance level: Independent with assistive device   Problem: RH Bathing Goal: LTG Patient will bathe all body parts with assist levels (OT) Description: LTG: Patient will bathe all body parts with assist levels (OT) Flowsheets (Taken 08/24/2023 1443) LTG: Pt will perform bathing with assistance level/cueing: Supervision/Verbal cueing   Problem: RH Dressing Goal: LTG Patient will perform lower body dressing w/assist (OT) Description: LTG: Patient will perform lower body dressing with assist, with/without cues in positioning using equipment (OT) Flowsheets (Taken 08/24/2023 1443) LTG: Pt will perform lower body dressing with assistance level of: Independent with assistive device   Problem: RH Toileting Goal: LTG Patient will perform toileting task (3/3 steps) with assistance level (OT) Description: LTG: Patient will perform toileting task (3/3 steps) with assistance level (OT)  Flowsheets (Taken 08/24/2023 1443) LTG: Pt will perform toileting task (3/3 steps) with assistance level: Independent with assistive device   Problem: RH Simple Meal Prep Goal: LTG Patient will perform simple meal prep w/assist (OT) Description: LTG: Patient will perform simple meal prep with assistance, with/without cues (OT). Flowsheets (Taken 08/24/2023 1443) LTG: Pt will perform simple meal prep with assistance level of: Independent with assistive device   Problem: RH Toilet Transfers Goal: LTG Patient will perform toilet transfers w/assist (OT) Description: LTG: Patient will perform toilet transfers with assist, with/without cues using equipment  (OT) Flowsheets (Taken 08/24/2023 1443) LTG: Pt will perform toilet transfers with assistance level of: Independent with assistive device   Problem: RH Tub/Shower Transfers Goal: LTG Patient will perform tub/shower transfers w/assist (OT) Description: LTG: Patient will perform tub/shower transfers with assist, with/without cues using equipment (OT) Flowsheets (Taken 08/24/2023 1443) LTG: Pt will perform tub/shower stall transfers with assistance level of: Supervision/Verbal cueing

## 2023-08-24 NOTE — Progress Notes (Signed)
Patient of Unsure of LBM. Per report-12/05. At 2215, complained of chest "tightness", " it's been going on for days." Tender to touch. Paged Jesusita Oka, PA orders for voltaren gel, applied at 2229. Complained of dizziness when assisted to [facility], resolved after returning to bed.PRN robaxin & zofran given at 2337. Rested quietly since meds given. Right hip incisions x 2 with surgical dressings CD&I. Alfredo Martinez A

## 2023-08-24 NOTE — Progress Notes (Signed)
Inpatient Rehabilitation Care Coordinator Assessment and Plan Patient Details  Name: Denise [name] MRN: [id number] Date of Birth: 23-Dec-1950  Today's Date: 08/24/2023  Hospital Problems: Principal Problem:   Lytic bone lesion of right femur  Past Medical History:  Past Medical History:  Diagnosis Date   Asthma    as child   Cancer (HCC) 12/2020   left breast IMC   Complication of anesthesia    pateitn states,' I coded when I had my D&Cmany years ago.   Dyspnea    Family history of breast cancer    Hypertension    Hypothyroidism    PONV (postoperative nausea and vomiting)    Port-A-Cath in place 02/26/2021   Past Surgical History:  Past Surgical History:  Procedure Laterality Date   ABDOMINAL HYSTERECTOMY     BIOPSY  01/17/2018   Procedure: BIOPSY;  Surgeon: Malissa Hippo, MD;  [facility]: [facility];  Service: Endoscopy;;  duodenum,gastric   CHOLECYSTECTOMY     COLONOSCOPY WITH PROPOFOL N/A 01/17/2018   Procedure: COLONOSCOPY WITH PROPOFOL;  Surgeon: Malissa Hippo, MD;  Location: [facility];  Service: Endoscopy;  Laterality: N/A;  7:30   DILATION AND CURETTAGE OF UTERUS     ESOPHAGOGASTRODUODENOSCOPY (EGD) WITH PROPOFOL N/A 01/17/2018   Procedure: ESOPHAGOGASTRODUODENOSCOPY (EGD) WITH PROPOFOL;  Surgeon: Malissa Hippo, MD;  Location: [facility];  Service: Endoscopy;  Laterality: N/A;   POLYPECTOMY  01/17/2018   Procedure: POLYPECTOMY;  Surgeon: Malissa Hippo, MD;  Location: [facility];  Service: Endoscopy;;  transverse colon, cecal   PORTACATH PLACEMENT Right 02/17/2021   Procedure: INSERTION PORT-A-CATH;  Surgeon: Abigail Miyamoto, MD;  Location: [facility];  Service: General;  Laterality: Right;   RADIOACTIVE SEED GUIDED AXILLARY SENTINEL LYMPH NODE Left 11/20/2021   Procedure: RADIOACTIVE SEED GUIDED LEFT AXILLARY SENTINEL LYMPH NODE DISSECTION;  Surgeon: Abigail Miyamoto, MD;  Location: [facility];  Service: General;   Laterality: Left;   TOTAL MASTECTOMY Left 11/20/2021   Procedure: LEFT TOTAL MASTECTOMY;  Surgeon: Abigail Miyamoto, MD;  Location: [facility];  Service: General;  Laterality: Left;   Social History:  reports that she has never smoked. She has never used smokeless tobacco. She reports that she does not drink alcohol and does not use drugs.  Family / Support Systems Marital Status: Widow/Widower How Long?: n/a Patient Roles: Parent Spouse/Significant Other: n/a Children: Denise [name], Daughter Other Supports: N/A Anticipated Caregiver: Daughter, Denise [name] (If anticipating supervision) Ability/Limitations of Caregiver: Patient anticipates reaching MOD I goals Caregiver Availability: Other (Comment) (Patient anticipates reaching MOD I goals, supervision from daughter if needed.) Family Dynamics: support from daughter  Social History Preferred language: English Religion: Baptist Cultural Background: N/A Education: HS Health Literacy - How often do you need to have someone help you when you read instructions, pamphlets, or other written material from your doctor or pharmacy?: Never Writes: Yes Employment Status: Unemployed Date Retired/Disabled/Unemployed: n/a Marine scientist Issues: n/a Guardian/Conservator: n/a   Abuse/Neglect Abuse/Neglect Assessment Can Be Completed: Yes Physical Abuse: Denies Verbal Abuse: Denies Sexual Abuse: Denies Exploitation of patient/patient's resources: Denies Self-Neglect: Denies  Patient response to: Social Isolation - How often do you feel lonely or isolated from those around you?: Never  Emotional Status Pt's affect, behavior and adjustment status: Pleasant, having lunch. Recent Psychosocial Issues: Coping Psychiatric History: n/a Substance Abuse History: n/a  Patient / Family Perceptions, Expectations & Goals Pt/Family understanding of illness & functional limitations: yes Premorbid pt/family roles/activities:  Independent  Anticipated changes in roles/activities/participation: Patient anticpates discharging at MOD I level, daughter able to provide supervision if needed. Pt/family expectations/goals: MOD I  [employer]: None Premorbid Home Care/DME Agencies: Other (Comment) (Has RW, rollator, shower chair and SPC) Transportation available at discharge: daughter/friends Is the patient able to respond to transportation needs?: Yes In the past 12 months, has lack of transportation kept you from medical appointments or from getting medications?: No In the past 12 months, has lack of transportation kept you from meetings, work, or from getting things needed for daily living?: No Resource referrals recommended: Neuropsychology  Discharge Planning Living Arrangements: Alone Support Systems: Friends/neighbors, Children Type of Residence: Private residence (1 level home, level entry) Insurance Resources: Electrical engineer Resources: Restaurant manager, fast food Screen Referred: No Living Expenses: Own Money Management: Patient Does the patient have any problems obtaining your medications?: No Home Management: Independent Patient/Family Preliminary Plans: Plans to remain independent Care Coordinator Barriers to Discharge: Lack of/limited family support, Decreased caregiver support Care Coordinator Anticipated Follow Up Needs: HH/OP Expected length of stay: 10-12 Days  Clinical Impression Sw met with patient, introduced self and explained role. Patient anticipates discharging home MOD I. Patient expressed if unable to d/c home MOD I, daughter is able to provide 24/7 supervision.  Patient prefers to d/c home MOD I. Patient lives in a one level home, level entry. Patient currently has a RW, rollator, shower seat and SPC. No additional questions or concerns.  Andria Rhein 08/24/2023, 2:25 PM

## 2023-08-24 NOTE — Progress Notes (Signed)
Inpatient Rehabilitation Admission Medication Review by a Pharmacist  A complete drug regimen review was completed for this patient to identify any potential clinically significant medication issues.  High Risk Drug Classes Is patient taking? Indication by Medication  Antipsychotic No   Anticoagulant No   Antibiotic No   Opioid Yes PRN Oxycodone - moderate pain  Antiplatelet Yes Aspirin 81 mg BID - VTE prophylaxis (with TED hose)  Hypoglycemics/insulin No   Vasoactive Medication Yes PRN Hydralazine - for SBP > 180 or DBP > 90  Chemotherapy No Ongoing outpatient chemotherapy.   Other Yes Dexamethasone - brain lesions Diclofenac gel (to chest) - topical pain relief Docusate - laxative Levothyroxine - hypothyroidism Pantoprazole - GI prophylaxis Sertaline - mood Topiramate - headache (and Fioricet x 1 today)  PRNs: Acetaminophen - pain Methocarbamol - muscle spasms Ondansetron - nausea Miralax, sorbitol - constipation     Type of Medication Issue Identified Description of Issue Recommendation(s)  Drug Interaction(s) (clinically significant)     Duplicate Therapy     Allergy     No Medication Administration End Date     Incorrect Dose     Additional Drug Therapy Needed  Had been on Dexamethasone 4 mg BID w/ meals. Last dose PTA pre-op 12/6. Also off Protonix BID before meals and Sertraline 25 mg hs since prior to surgery.  Resume all 3 meds per Dr. Carlis Abbott.  Significant med changes from prior encounter (inform family/care partners about these prior to discharge). New aspirin 81 mg BID for VTE prophylaxis. New topiramate. Gabapentin discontinued today. Off prn Fioriect for headaches. Communicate changes with patient/family prior to discharge.  Ordered x 1 today; add prn if needed.  Other  Off olmesartan/hydrochlorothiazide daily prn. Monitor blood pressure and resume if/when clinically indicated. Usually given daily rather than prn.      Clinically significant  medication issues were identified that warrant physician communication and completion of prescribed/recommended actions by midnight of the next day:  Yes  Name of provider notified for urgent issues identified:  Dr. Carlis Abbott  Provider Method of Notification: secure chat   Pharmacist comments:  - last outpatient chemotherapy treatment ([facility]) 07/05/23. Next appears to be planned for 08/26/2023. - had been tapering off Dexamethasone per outpatient notes, then increased back to 4 mg BID with meals on 08/09/23.   Time spent performing this drug regimen review (minutes):  [address]   Dennie Fetters, [place] 08/24/2023 10:29 AM

## 2023-08-24 NOTE — Progress Notes (Signed)
Occupational Therapy Session Note  Patient Details  Name: Denise [name] MRN: [id number] Date of Birth: 01/18/1951  Today's Date: 08/24/2023 OT Missed Time: 30 Minutes Missed Time Reason: Patient fatigue   Pt received supine in bed. Pt verbalized request to remain in bed due to bone chills and fatigue. Pt declined self-care needs. Pt missed 30 mins of skilled OT intervention; OT will make up missed time as able.   Denise [name] E Brielyn Bosak, MS, OTR/L  08/24/2023, 2:42 PM

## 2023-08-24 NOTE — Discharge Instructions (Addendum)
Inpatient Rehab Discharge Instructions  Denise [name] Discharge date and time: No discharge date for patient encounter.   Activities/Precautions/ Functional Status: Activity: activity as tolerated Diet: regular diet Wound Care: Routine skin checks Functional status:  ___ No restrictions     ___ Walk up steps independently ___ 24/7 supervision/assistance   ___ Walk up steps with assistance ___ Intermittent supervision/assistance  ___ Bathe/dress independently ___ Walk with walker     _x__ Bathe/dress with assistance ___ Walk Independently    ___ Shower independently ___ Walk with assistance    ___ Shower with assistance ___ No alcohol     ___ Return to work/school ________  Special Instructions: No driving smoking or alcohol   COMMUNITY REFERRALS UPON DISCHARGE:    [facility]-will call to set up RN and aide once hoe   Medical Equipment/Items Ordered:hospital bed and wheelchair                                                 Agency/Supplier:[facility]  [phone]    My questions have been answered and I understand these instructions. I will adhere to these goals and the provided educational materials after my discharge from the hospital.  Patient/Caregiver Signature _______________________________ Date __________  Clinician Signature _______________________________________ Date __________  Please bring this form and your medication list with you to all your follow-up doctor's appointments.  Information on my medicine - ELIQUIS (apixaban)  This medication education was reviewed with me or my healthcare representative as part of my discharge preparation.    Why was Eliquis prescribed for you? Eliquis was prescribed to treat blood clots that may have been found in the veins of your legs (deep vein thrombosis) or in your lungs (pulmonary embolism) and to reduce the risk of them occurring again.  What do You need to know about Eliquis ? The starting dose is 10 mg  (two 5 mg tablets) taken TWICE daily for the FIRST SEVEN (7) DAYS, then on (enter date)  09/01/23  the dose is reduced to ONE 5 mg tablet taken TWICE daily.  Eliquis may be taken with or without food.   Try to take the dose about the same time in the morning and in the evening. If you have difficulty swallowing the tablet whole please discuss with your pharmacist how to take the medication safely.  Take Eliquis exactly as prescribed and DO NOT stop taking Eliquis without talking to the doctor who prescribed the medication.  Stopping may increase your risk of developing a new blood clot.  Refill your prescription before you run out.  After discharge, you should have regular check-up appointments with your healthcare provider that is prescribing your Eliquis.    What do you do if you miss a dose? If a dose of ELIQUIS is not taken at the scheduled time, take it as soon as possible on the same day and twice-daily administration should be resumed. The dose should not be doubled to make up for a missed dose.  Important Safety Information A possible side effect of Eliquis is bleeding. You should call your healthcare provider right away if you experience any of the following: Bleeding from an injury or your nose that does not stop. Unusual colored urine (red or dark brown) or unusual colored stools (red or black). Unusual bruising for unknown reasons. A serious fall or if you hit  your head (even if there is no bleeding).  Some medicines may interact with Eliquis and might increase your risk of bleeding or clotting while on Eliquis. To help avoid this, consult your healthcare provider or pharmacist prior to using any new prescription or non-prescription medications, including herbals, vitamins, non-steroidal anti-inflammatory drugs (NSAIDs) and supplements.  This website has more information on Eliquis (apixaban): http://[email or web address].com/eliquis/home

## 2023-08-24 NOTE — Discharge Summary (Signed)
Physician Discharge Summary  Patient ID: Denise [name] MRN: [id number] DOB/AGE: 1951/03/12 72 y.o.  Admit date: 08/23/2023 Discharge date: 08/31/2023  Discharge Diagnoses:  Principal Problem:   Lytic bone lesion of right femur Active Problems:   Coping style affecting medical condition Right common femoral, right femoral vein, right popliteal and right posterior tibial and right peroneal vein DVT Pain management Acute blood loss anemia Metastatic breast cancer with mets to the brain and right femur Hypothyroidism Hypertension Peripheral neuropathy due to chemotherapy Dysphagia due to radiation therapy Urinary retention  Discharged Condition: Stable  Significant Diagnostic Studies: DG Chest 2 View Result Date: 08/26/2023 CLINICAL DATA:  Short of breath EXAM: CHEST - 2 VIEW COMPARISON:  07/17/2023 FINDINGS: Frontal and lateral views of the chest demonstrates stable right chest wall port. Cardiac silhouette is stable, with continued ectasia of the thoracic aorta. No acute airspace disease, effusion, or pneumothorax. No acute bony abnormality. IMPRESSION: 1. Stable chest, no acute process. Electronically Signed   By: Sharlet Salina M.D.   On: 08/26/2023 18:04   CT ABDOMEN PELVIS WO CONTRAST Result Date: 08/26/2023 CLINICAL DATA:  Abdominal pain, acute, nonlocalized lower GI bleed. EXAM: CT ABDOMEN AND PELVIS WITHOUT CONTRAST TECHNIQUE: Multidetector CT imaging of the abdomen and pelvis was performed following the standard protocol without IV contrast. RADIATION DOSE REDUCTION: This exam was performed according to the departmental dose-optimization program which includes automated exposure control, adjustment of the mA and/or kV according to patient size and/or use of iterative reconstruction technique. COMPARISON:  Abdominopelvic CT with contrast 06/08/2023 FINDINGS: Lower chest: Mild motion artifact on images through the lung bases and upper chest. No change in mild linear atelectasis  or scarring at both lung bases. No change in clustered nodularity posteromedially in the left lower lobe. Central venous catheter terminates at the superior cavoatrial junction. There is aortic and coronary atherosclerosis. Hepatobiliary: The liver appears unremarkable as imaged in the noncontrast state. Status post cholecystectomy without evidence of biliary dilatation. Pancreas: Unremarkable. No pancreatic ductal dilatation or surrounding inflammatory changes. Spleen: Normal in size without focal abnormality. Adrenals/Urinary Tract: Both adrenal glands appear normal. No evidence of urinary tract calculus, suspicious renal lesion or hydronephrosis. The bladder appears unremarkable for its degree of distention. Stomach/Bowel: No enteric contrast administered. The stomach appears unremarkable for its degree of distension. No evidence of bowel wall thickening, distention or surrounding inflammatory change. There is a moderate amount of high density stool throughout the colon. Sigmoid diverticular changes are present. The appendix is not clearly visualized. Vascular/Lymphatic: There are no enlarged abdominal or pelvic lymph nodes. Mild aortoiliac atherosclerosis without evidence of aneurysm. Reproductive: Status post hysterectomy.  No adnexal mass. Other: No evidence of abdominal wall mass or hernia. No ascites or pneumoperitoneum. Musculoskeletal: Grossly stable known osseous metastatic disease within the T8 and T11 vertebral bodies, upper sacrum, both iliac crests and the left ischium. Patient has undergone interval proximal right femoral intramedullary nail and dynamic screw fixation. There is edema, soft tissue emphysema and hematomas within the subcutaneous fat lateral to the right buttocks, the latter measuring up to 4.7 x 2.0 cm on image 65/3. IMPRESSION: 1. No acute findings or explanation for the patient's symptoms. No evidence of gastrointestinal bleeding on noncontrast imaging. High density stool throughout  the colon with mild distal colonic diverticulosis. 2. Interval proximal right femoral intramedullary nail and dynamic screw fixation with edema, soft tissue emphysema and hematomas within the subcutaneous fat lateral to the right buttocks. 3. Grossly stable known osseous metastatic disease. 4. Stable  clustered nodularity posteromedially in the left lower lobe, likely infectious/inflammatory. 5.  Aortic Atherosclerosis (ICD10-I70.0). Electronically Signed   By: Carey Bullocks M.D.   On: 08/26/2023 14:17   CT HEAD WO CONTRAST ( ) Result Date: 08/26/2023 CLINICAL DATA:  Altered mental status, nontraumatic (Ped 0-17y) EXAM: CT HEAD WITHOUT CONTRAST TECHNIQUE: Contiguous axial images were obtained from the base of the skull through the vertex without intravenous contrast. RADIATION DOSE REDUCTION: This exam was performed according to the departmental dose-optimization program which includes automated exposure control, adjustment of the mA and/or kV according to patient size and/or use of iterative reconstruction technique. COMPARISON:  Prior bone scan dated 06/29/2023, brain MR 04/23/2023 FINDINGS: Brain: No hemorrhage. No hydrocephalus. Extra-axial fluid collection. No CT evidence of an acute cortical infarct. No mass effect. No mass lesion. There is a background of mild chronic microvascular ischemic change. Vascular: No hyperdense vessel or unexpected calcification. Skull: There are scattered lucent calvarial lesions, most notable of which is in the right occipital bone (series 4, image 20), worrisome for osseous metastatic disease. Sinuses/Orbits: Large right mastoid effusion and middle ear effusion. No left-sided mastoid or middle ear effusion. Paranasal sinuses are clear. Left lens replacement. Orbits are otherwise unremarkable. Other: None. IMPRESSION: 1. No CT finding to explain altered mental status 2. Scattered lucent calvarial lesions, most notable of which is in the right occipital bone, worrisome for  osseous metastatic disease. 3. Large right mastoid and middle ear effusion, unchanged from prior brain MRI. Electronically Signed   By: Lorenza Cambridge M.D.   On: 08/26/2023 10:33   VAS [place] LOWER EXTREMITY VENOUS (DVT) Result Date: 08/25/2023  Lower Venous DVT Study Patient Name:  Denise [name]  Date of Exam:   08/25/2023 Medical Rec #: [id number]          Accession #:    [id number] Date of Birth: 1951/08/18          Patient Gender: F Patient Age:   29 years Exam Location:  [facility] Procedure:      VAS [place] LOWER EXTREMITY VENOUS (DVT) Referring Phys: Mariam Dollar --------------------------------------------------------------------------------  Indications: Swelling.  Risk Factors: Cancer. Comparison Study: No prior studies. Performing Technologist: Chanda Busing RVT  Examination Guidelines: A complete evaluation includes B-mode imaging, spectral Doppler, color Doppler, and power Doppler as needed of all accessible portions of each vessel. Bilateral testing is considered an integral part of a complete examination. Limited examinations for reoccurring indications may be performed as noted. The reflux portion of the exam is performed with the patient in reverse Trendelenburg.  +---------+---------------+---------+-----------+----------+--------------+ RIGHT    CompressibilityPhasicitySpontaneityPropertiesThrombus Aging +---------+---------------+---------+-----------+----------+--------------+ CFV      Partial        Yes      Yes                  Acute          +---------+---------------+---------+-----------+----------+--------------+ SFJ      Full                                                        +---------+---------------+---------+-----------+----------+--------------+ FV Prox  None           No       No                   Acute          +---------+---------------+---------+-----------+----------+--------------+  FV Mid   None           No       No                    Acute          +---------+---------------+---------+-----------+----------+--------------+ FV DistalNone           No       No                   Acute          +---------+---------------+---------+-----------+----------+--------------+ PFV      Full                                                        +---------+---------------+---------+-----------+----------+--------------+ POP      None           No       No                   Acute          +---------+---------------+---------+-----------+----------+--------------+ PTV      None                                         Acute          +---------+---------------+---------+-----------+----------+--------------+ PERO     Partial                                      Acute          +---------+---------------+---------+-----------+----------+--------------+ Gastroc  Partial                                      Acute          +---------+---------------+---------+-----------+----------+--------------+   +---------+---------------+---------+-----------+----------+--------------+ LEFT     CompressibilityPhasicitySpontaneityPropertiesThrombus Aging +---------+---------------+---------+-----------+----------+--------------+ CFV      Full           Yes      Yes                                 +---------+---------------+---------+-----------+----------+--------------+ SFJ      Full                                                        +---------+---------------+---------+-----------+----------+--------------+ FV Prox  Full                                                        +---------+---------------+---------+-----------+----------+--------------+ FV Mid   Full                                                        +---------+---------------+---------+-----------+----------+--------------+  FV DistalFull                                                         +---------+---------------+---------+-----------+----------+--------------+ PFV      Full                                                        +---------+---------------+---------+-----------+----------+--------------+ POP      Full           Yes      Yes                                 +---------+---------------+---------+-----------+----------+--------------+ PTV      Full                                                        +---------+---------------+---------+-----------+----------+--------------+ PERO     Full                                                        +---------+---------------+---------+-----------+----------+--------------+     Summary: RIGHT: - Findings consistent with acute deep vein thrombosis involving the right common femoral vein, right femoral vein, right popliteal vein, right posterior tibial veins, and right peroneal veins. Findings consistent with acute intramuscular thrombosis involving the right gastrocnemius veins. - No cystic structure found in the popliteal fossa.  LEFT: - There is no evidence of deep vein thrombosis in the lower extremity.  - No cystic structure found in the popliteal fossa.  *See table(s) above for measurements and observations. Electronically signed by Coral Else MD on 08/25/2023 at 9:09:36 PM.    Final    DG FEMUR, MIN 2 VIEWS RIGHT Result Date: 08/20/2023 CLINICAL DATA:  ProScan pre provided for right femur ORIF. EXAM: OPERATIVE RIGHT HIP AND FEMUR 9 VIEWS TECHNIQUE: Fluoroscopic spot image(s) were submitted for interpretation post-operatively. COMPARISON:  07/05/2023. FINDINGS: Images show placement an intramedullary rod, distal aspect in the distal femoral metaphysis. Rod supports 2 compression screws crossing the neck into the femoral head. Orthopedic hardware is well seated. IMPRESSION: 1. Imaging provided for right femur ORIF. Please refer to the procedure report for further details. Electronically Signed   By: Amie Portland M.D.   On: 08/20/2023 16:22   DG C-Arm 1-60 Min-No Report Result Date: 08/20/2023 Fluoroscopy was utilized by the requesting physician.  No radiographic interpretation.    Labs:  Basic Metabolic Panel: Recent Labs  Lab 08/24/23 0609 08/27/23 1512 08/30/23 0213  NA 131* 131* 131*  K 4.5 4.2 4.0  CL 104 103 105  CO2 22 20* 22  GLUCOSE 67* 94 98  BUN 20 21 17   CREATININE 0.63 0.97 0.55  CALCIUM 7.7* 7.7* 7.8*    CBC: Recent Labs  Lab 08/25/23 0550  08/26/23 0432 08/30/23 0213  WBC 5.5 6.7 5.1  NEUTROABS 4.6 5.0 4.0  HGB 8.8* 8.5* 9.0*  HCT 25.9* 25.8* 27.1*  MCV 104.0* 105.3* 105.9*  PLT 130* 155 199    CBG: No results for input(s): "GLUCAP" in the last 168 hours.  Family history.  Mother with breast cancer and CAD father with lung cancer.  Denies any colon cancer esophageal cancer or rectal cancer  Brief HPI:   Denise [name] is a 72 y.o. right-handed female with history of hypertension hypothyroidism, history of metastatic breast cancer with left total mastectomy 11/20/2021 as well as mets to the brain and right femur followed by oncology services Dr.Katragadda.  Per chart review patient lives alone.  She works part-time at [employer].  She is a retired Hotel manager.  Presented 08/20/2023 per orthopedic services Dr. Roda Shutters for surgical treatment of right femur lytic lesions.  Underwent prophylactic intramedullary nail of right femur 08/20/2023.  Weightbearing as tolerated right lower extremity.  Acute blood loss anemia 10.1 and monitored.  Blood pressure remained soft postoperatively and monitored.  Placed on aspirin 81 mg twice daily for DVT prophylaxis.  Therapy evaluations completed due to patient decreased functional mobility was admitted for a comprehensive rehab program.   Hospital Course: Denise [name] was admitted to rehab 08/23/2023 for inpatient therapies to consist of PT, ST and OT at least three hours five days a week. Past admission  physiatrist, therapy team and rehab RN have worked together to provide customized collaborative inpatient rehab.  Pertaining to patient's metastatic breast cancer with metastasis to the right femur.  She underwent prophylactic intramedullary nailing of right femur 08/20/2023 per orthopedic service.  Weightbearing as tolerated.  Neurovascular sensation intact.  Maintained on aspirin 81 mg twice daily for DVT prophylaxis venous Doppler studies completed 08/25/2023 showing extensive right common femoral, right femoral vein, right popliteal as well as right posterior tibial and right peroneal vein DVT and patient was transitioned to Eliquis..  Palliative care was consulted to establish goals of care as well as followed by neuropsychology offering emotional support.  Zoloft was added to her regimen for mood stabilization.  Pain management use of Neurontin scheduled as well as oxycodone Robaxin as needed for breakthrough pain and muscle spasms.  Acute blood loss anemia no overt signs of bleeding and monitored and latest hemoglobin 9.0.  Hormone supplement ongoing for hypothyroidism.  Blood pressure monitored remained soft she had been on Benicar as needed prior to admission and monitored.  History of peripheral neuropathy due to chemotherapy follow-up outpatient.  In regards to her history of metastatic breast cancer she was followed outpatient by oncology services Dr.Katragadda.  Patient did have bouts of urinary retention requiring In-N-Out catheterizations a Foley catheter tube could be placed to simplify care if needed.  Palliative care was consulted for goals of care and plan Home with hospice.   Blood pressures were monitored on TID basis and soft and monitored     Rehab course: During patient's stay in rehab weekly team conferences were held to monitor patient's progress, set goals and discuss barriers to discharge. At admission, patient required minimal assist 3 feet rolling walker moderate assist stand  pivot transfers  Physical exam.  Blood pressure 93/64 pulse 79 temperature 98.8 respiration 16 oxygen saturation is 98% room air Constitutional.  No acute distress HEENT Head.  Normocephalic and atraumatic Eyes.  Pupils round and reactive to light no discharge without nystagmus Neck.  Supple nontender no JVD without thyromegaly Cardiac  regular rate and rhythm without any extra sounds or murmur heard Abdomen.  Soft nontender positive bowel sounds without rebound Respiratory effort normal no respiratory distress without wheeze Skin.  Right hip incision dressed appropriately tender Neurologic.  Alert oriented x 3 following commands.  Mild delay in responses.  Mild dysarthria. Motor strength 5/5 bilateral deltoid bicep grip hip flexors, 4/5 left and trace right knee extensors, 5/5 bilateral ankle dorsi plantarflexion  He/She  has had improvement in activity tolerance, balance, postural control as well as ability to compensate for deficits. He/She has had improvement in functional use RUE/LUE  and RLE/LLE as well as improvement in awareness.  Transferred sit to stand with grab bar minimal assist to moderate assist.  Transferred toilet to wheelchair with moderate assist squat pivot transfer.  Patient and daughter transferred wheelchair to edge of bed with squat pivot transfers moderate assist with verbal cues.  Patient did need some encouragement at times participate.  Transferred edge of bed to supine with minimal assist and verbal cues.  Extensive time dedicated to updating daughter on recent functional mobility       Disposition: Home/06 Home Health Care    Diet: Soft  Special Instructions: No driving smoking or alcohol  Weightbearing as tolerated right lower extremity  Medications at discharge. 1.  Tylenol as needed 2.  Eliquis 5 mg p.o. twice daily 3.  Voltaren gel 4 g 4 times daily to affected area 4.  Colace 100 mg p.o. twice daily 5.  B complex with vitamin C daily 6.   Synthroid 75 mcg p.o. before breakfast 7.  Robaxin 500 mg every 6 hours as needed muscle spasms 8.  Oxycodone 5 to 10 mg every 4 hours as needed pain 9.  Zoloft 25 mg daily 10.  Decadron 4 mg twice daily 11.  Vitamin D 1000 units daily 12.  Zyrtec 10 mg daily as needed for allergy 13.  Protonix 40 mg twice daily   30-35 minutes were spent completing discharge summary and discharge planning     Follow-up Information     Raulkar, Drema Pry, MD Follow up.   Specialty: Physical Medicine and Rehabilitation Why: No formal follow-up needed Contact information: [address] [place] [address] [phone]         Tarry Kos, MD Follow up.   Specialty: Orthopedic Surgery Why: Call for appointment Contact information: [address] [place] [address] [phone]         [name], MD Follow up.   Specialty: Hematology Why: Call for appointment Contact information: [address] [place] [address] [phone]                 Signed: Mcarthur Rossetti Tyrene Nader 08/30/2023, 10:49 AM

## 2023-08-24 NOTE — Evaluation (Signed)
Physical Therapy Assessment and Plan  Patient Details  Name: Denise [name] MRN: [id number] Date of Birth: 12/19/1950  PT Diagnosis: Abnormal posture, Abnormality of gait, Difficulty walking, Dizziness and giddiness, Edema, Impaired sensation, Muscle weakness, and Pain in head Rehab Potential: Good ELOS: 12 days   Today's Date: 08/24/2023 PT Individual Time: 1027-2536 PT Individual Time Calculation (min): 71 min    Hospital Problem: Principal Problem:   Lytic bone lesion of right femur   Past Medical History:  Past Medical History:  Diagnosis Date   Asthma    as child   Cancer (HCC) 12/2020   left breast IMC   Complication of anesthesia    pateitn states,' I coded when I had my D&Cmany years ago.   Dyspnea    Family history of breast cancer    Hypertension    Hypothyroidism    PONV (postoperative nausea and vomiting)    Port-A-Cath in place 02/26/2021   Past Surgical History:  Past Surgical History:  Procedure Laterality Date   ABDOMINAL HYSTERECTOMY     BIOPSY  01/17/2018   Procedure: BIOPSY;  Surgeon: Malissa Hippo, MD;  [facility];  Service: Endoscopy;;  duodenum,gastric   CHOLECYSTECTOMY     COLONOSCOPY WITH PROPOFOL N/A 01/17/2018   Procedure: COLONOSCOPY WITH PROPOFOL;  Surgeon: Malissa Hippo, MD;  [facility]: [facility];  Service: Endoscopy;  Laterality: N/A;  7:30   DILATION AND CURETTAGE OF UTERUS     ESOPHAGOGASTRODUODENOSCOPY (EGD) WITH PROPOFOL N/A 01/17/2018   Procedure: ESOPHAGOGASTRODUODENOSCOPY (EGD) WITH PROPOFOL;  Surgeon: Malissa Hippo, MD;  [facility];  Service: Endoscopy;  Laterality: N/A;   POLYPECTOMY  01/17/2018   Procedure: POLYPECTOMY;  Surgeon: Malissa Hippo, MD;  [facility]: [facility];  Service: Endoscopy;;  transverse colon, cecal   PORTACATH PLACEMENT Right 02/17/2021   Procedure: INSERTION PORT-A-CATH;  Surgeon: Abigail Miyamoto, MD;  Location: [facility];  Service: General;  Laterality:  Right;   RADIOACTIVE SEED GUIDED AXILLARY SENTINEL LYMPH NODE Left 11/20/2021   Procedure: RADIOACTIVE SEED GUIDED LEFT AXILLARY SENTINEL LYMPH NODE DISSECTION;  Surgeon: Abigail Miyamoto, MD;  Location: [facility];  Service: General;  Laterality: Left;   TOTAL MASTECTOMY Left 11/20/2021   Procedure: LEFT TOTAL MASTECTOMY;  Surgeon: Abigail Miyamoto, MD;  Location: [facility];  Service: General;  Laterality: Left;    Assessment & Plan Clinical Impression: Patient is a 72 y.o. year old female with history of hypertension, hypothyroidism, history of metastatic breast cancer with left total mastectomy 11/20/2021 as well as metastasis to the right femur followed by oncology services DrKatragadda. Per chart review patient lives alone. 1 level home with a level entry. 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 Presented 08/20/2023 per orthopedic services Dr.Xu for surgical treatment of right femur lytic lesions. Underwent prophylactic intramedullary nail of right femur 08/20/2023. Weightbearing as tolerated right lower extremity. Acute blood loss anemia 10.1 and monitored. Blood pressure remained soft postoperatively and monitored. She was placed on aspirin 81 mg twice daily for DVT prophylaxis. Therapy evaluations completed due to patient's decreased functional mobility was admitted for a comprehensive rehab program.   Patient currently requires mod with mobility secondary to muscle weakness, decreased cardiorespiratoy endurance, and decreased standing balance, decreased postural control, and decreased balance strategies.  Prior to hospitalization, patient was independent  with mobility and lived with Alone in a House home.  Home access is  Level entry.  Patient will benefit from skilled PT intervention to maximize safe functional mobility, minimize fall risk, and decrease caregiver burden for planned discharge home with  intermittent assist.  Anticipate patient will benefit from follow up [facility] at discharge.  PT - End of Session Activity Tolerance: Tolerates 30+ min activity with multiple rests Endurance Deficit: Yes Endurance Deficit Description: pt required frequent breaks due to feeling lightheaded and nauseous PT Assessment Rehab Potential (ACUTE/IP ONLY): Good PT Barriers to Discharge: Decreased caregiver support;Other (comments) PT Barriers to Discharge Comments: pain, lightheadedness, nausea, lives alone PT Patient demonstrates impairments in the following area(s): Balance;Edema;Endurance;Nutrition;Pain;Sensory;Skin Integrity PT Transfers Functional Problem(s): Bed Mobility;Bed to Chair;Car;Furniture PT Locomotion Functional Problem(s): Ambulation;Wheelchair Mobility;Stairs PT Plan PT Intensity: Minimum of 1-2 x/day ,45 to 90 minutes PT Frequency: 5 out of 7 days PT Duration Estimated Length of Stay: 12 days PT Treatment/Interventions: Ambulation/gait training;Discharge planning;Functional mobility training;Therapeutic Activities;Balance/vestibular training;Disease management/prevention;Neuromuscular re-education;Skin care/wound management;Therapeutic Exercise;Wheelchair propulsion/positioning;DME/adaptive equipment instruction;Pain management;UE/LE Strength taining/ROM;Community reintegration;Patient/family education;Stair training;UE/LE Coordination activities PT Transfers Anticipated Outcome(s): Mod I with LRAD PT Locomotion Anticipated Outcome(s): Mod I with LRAD PT Recommendation Follow Up Recommendations: Home health PT Patient destination: Home Equipment Recommended: To be determined Equipment Details: has RW, SPC, and rollator   PT Evaluation Precautions/Restrictions Precautions Precautions: Fall Precaution Comments: active cancer wtih chemo port, monitor BP Restrictions Weight Bearing Restrictions: No RLE Weight Bearing: Weight bearing as tolerated Pain Interference Pain  Interference Pain Effect on Sleep: 3. Frequently Pain Interference with Therapy Activities: 1. Rarely or not at all Pain Interference with Day-to-Day Activities: 1. Rarely or not at all Home Living/Prior Functioning Home Living Available Help at Discharge: Family;Available PRN/intermittently (daughter available at night) Type of Home: House Home Access: Level entry Home Layout: One level Bathroom Shower/Tub: Walk-in shower;Curtain (built in Brewing technologist) Bathroom Toilet: Standard Bathroom Accessibility: Yes Additional Comments: has RW, rollator, and SPC but was not using AD prior to admission  Lives With: Alone Prior Function Level of Independence: Independent with basic ADLs;Independent with transfers;Independent with homemaking with ambulation;Independent with gait  Able to Take Stairs?: Yes Driving: Yes Vocation: Part time employment Vocation Requirements: Armed forces technical officer at [employer] Vision/Perception  Vision - History Ability to See in Adequate Light: 1 Impaired Vision - Assessment Additional Comments: With glasses, pt able to read words but vision is grossly blurry and pt reports vision has worsened since last radiation treatment. R eye worse than L Perception Perception: Impaired Preception Impairment Details: Spatial orientation Praxis Praxis: WFL  Cognition Overall Cognitive Status: Within Functional Limits for tasks assessed Arousal/Alertness: Awake/alert Orientation Level: Oriented X4 Memory: Appears intact Awareness: Appears intact Problem Solving: Appears intact Safety/Judgment: Appears intact Sensation Sensation Light Touch: Appears Intact Hot/Cold: Not tested Proprioception: Appears Intact Stereognosis: Not tested Additional Comments: pt reports numbness along bilateral toes Coordination Gross Motor Movements are Fluid and Coordinated: Yes (generalized weakness/deconditioning and pain) Fine Motor Movements are Fluid and Coordinated:  Yes Finger Nose Finger Test: [facility] bilaterally Heel Shin Test: unable to perform on RLE and significantly decreased ROM on LLE Motor  Motor Motor: Within Functional Limits  Trunk/Postural Assessment  Cervical Assessment Cervical Assessment: Exceptions to [facility] (forward head) Thoracic Assessment Thoracic Assessment: Exceptions to [facility] (kyphosis) Lumbar Assessment Lumbar Assessment: Within Functional Limits Postural Control Postural Control: Within Functional Limits  Balance Balance Balance Assessed: Yes Static Sitting Balance Static Sitting - Balance Support: Feet supported;Bilateral upper extremity supported Static Sitting - Level of Assistance: 5: Stand by assistance (supervision) Dynamic Sitting Balance Dynamic Sitting - Balance  Support: Feet supported;No upper extremity supported Dynamic Sitting - Level of Assistance: 5: Stand by assistance (supervision) Static Standing Balance Static Standing - Balance Support: Bilateral upper extremity supported;During functional activity (RW) Static Standing - Level of Assistance: 5: Stand by assistance (CGA) Dynamic Standing Balance Dynamic Standing - Balance Support: Bilateral upper extremity supported;During functional activity (RW) Dynamic Standing - Level of Assistance: 4: Min assist Dynamic Standing - Comments: during transfers Extremity Assessment  RLE Assessment RLE Assessment: Exceptions to [facility] General Strength Comments: tested sitting EOB RLE Strength Right Hip Flexion: 2+/5 Right Hip ABduction: 3+/5 Right Hip ADduction: 3+/5 Right Knee Flexion: 3/5 Right Knee Extension: 3-/5 Right Ankle Dorsiflexion: 3+/5 Right Ankle Plantar Flexion: 3+/5 LLE Assessment LLE Assessment: Exceptions to [facility] General Strength Comments: tested sitting EOB LLE Strength Left Hip Flexion: 3-/5 Left Hip ABduction: 3+/5 Left Hip ADduction: 3+/5 Left Knee Flexion: 3-/5 Left Knee Extension: 3+/5 Left Ankle Dorsiflexion: 4-/5 Left Ankle Plantar  Flexion: 4-/5  Care Tool Care Tool Bed Mobility Roll left and right activity   Roll left and right assist level: Supervision/Verbal cueing    Sit to lying activity        Lying to sitting on side of bed activity   Lying to sitting on side of bed assist level: the ability to move from lying on the back to sitting on the side of the bed with no back support.: Supervision/Verbal cueing     Care Tool Transfers Sit to stand transfer   Sit to stand assist level: Moderate Assistance - Patient 50 - 74%    Chair/bed transfer   Chair/bed transfer assist level: Minimal Assistance - Patient > 75%    Car transfer Car transfer activity did not occur: Safety/medical concerns (lightheadedness, nausea, headache)        Care Tool Locomotion Ambulation Ambulation activity did not occur: Safety/medical concerns (lightheadedness, nausea, headache)        Walk 10 feet activity Walk 10 feet activity did not occur: Safety/medical concerns (lightheadedness, nausea, headache)       Walk 50 feet with 2 turns activity Walk 50 feet with 2 turns activity did not occur: Safety/medical concerns (lightheadedness, nausea, headache)      Walk 150 feet activity Walk 150 feet activity did not occur: Safety/medical concerns (lightheadedness, nausea, headache)      Walk 10 feet on uneven surfaces activity Walk 10 feet on uneven surfaces activity did not occur: Safety/medical concerns (lightheadedness, nausea, headache)      Stairs Stair activity did not occur: Safety/medical concerns (lightheadedness, nausea, headache)        Walk up/down 1 step activity Walk up/down 1 step or curb (drop down) activity did not occur: Safety/medical concerns (lightheadedness, nausea, headache)      Walk up/down 4 steps activity Walk up/down 4 steps activity did not occur: Safety/medical concerns (lightheadedness, nausea, headache)      Walk up/down 12 steps activity Walk up/down 12 steps activity did not occur:  Safety/medical concerns (lightheadedness, nausea, headache)      Pick up small objects from floor Pick up small object from the floor (from standing position) activity did not occur: Safety/medical concerns (lightheadedness, nausea, headache)      Wheelchair Is the patient using a wheelchair?: Yes Type of Wheelchair: Manual   Wheelchair assist level: Dependent - Patient 0% Max wheelchair distance: >317ft  Wheel 50 feet with 2 turns activity   Assist Level: Dependent - Patient 0%  Wheel 150 feet activity   Assist Level: Dependent -  Patient 0%    Refer to Care Plan for Long Term Goals  SHORT TERM GOAL WEEK 1 PT Short Term Goal 1 (Week 1): pt will transfer sit<>stand with LRAD and CGA PT Short Term Goal 2 (Week 1): pt will transfer bed<>chair with LRAD and CGA PT Short Term Goal 3 (Week 1): pt will ambulate 53ft with LRAD and CGA  Recommendations for other services: None   Skilled Therapeutic Intervention Evaluation completed (see details above and below) with education on PT POC and goals and individual treatment initiated with focus on functional mobility/transfers, generalized strengthening and endurance, and standing balance/coordination. Received pt semi-reclined in bed, pt educated on PT evaluation, CIR policies, and therapy schedule and agreeable. Pt reported having headache - RN notified. Provided pt with 18x16 manual WC and R elevating legrest. Pt transferred semi-reclined<>sitting R EOB with HOB elevated and use of bedrails with supervision and increased time/effort. Pt reported feeling lightheaded - BP: 106/68 and symptoms did not improve. Pt moaning and leaning heavily on bedrails for support and appeared pale and ill-looking overall. Stood from low sitting EOB with RW and mod A x 2 trials and attempted to get BP in standing but pt unable to remain standing long enough to get reading, then became nauseous. Transferred bed<>WC stand<>pivot with RW and min A and MD arrived for  morning rounds and donned teds with total A. Pt transported around unit in [facility] dependently for change in environmental stimuli. Concluded session with pt sitting in WC, needs within reach, and seatbelt alarm on.   Mobility Bed Mobility Bed Mobility: Rolling Right;Supine to Sit Rolling Right: Supervision/verbal cueing (bedrails) Supine to Sit: Supervision/Verbal cueing (bedrails and HOB elevated) Transfers Transfers: Sit to Stand;Stand to Sit;Stand Pivot Transfers Sit to Stand: Moderate Assistance - Patient 50-74% Stand to Sit: Minimal Assistance - Patient > 75% Stand Pivot Transfers: Minimal Assistance - Patient > 75% Stand Pivot Transfer Details: Verbal cues for safe use of DME/AE Stand Pivot Transfer Details (indicate cue type and reason): verbal cues for hand placement on RW Transfer (Assistive device): Rolling walker Locomotion  Gait Ambulation: No Stairs / Additional Locomotion Stairs: No Wheelchair Mobility Wheelchair Mobility: Yes Wheelchair Assistance: Dependent - Patient 0% Wheelchair Parts Management: Needs assistance Distance: >330ft  Discharge Criteria: Patient will be discharged from PT if patient refuses treatment 3 consecutive times without medical reason, if treatment goals not met, if there is a change in medical status, if patient makes no progress towards goals or if patient is discharged from hospital.  The above assessment, treatment plan, treatment alternatives and goals were discussed and mutually agreed upon: by patient  Huntley Dec PT, DPT 08/24/2023, 12:01 PM

## 2023-08-24 NOTE — Progress Notes (Signed)
Inpatient Rehabilitation  Patient information reviewed and entered into eRehab system by Demarrio Menges Gentry, OTR/L, Rehab Quality Coordinator.   Information including medical coding, functional ability and quality indicators will be reviewed and updated through discharge.   

## 2023-08-24 NOTE — Progress Notes (Signed)
Physical Therapy Session Note  Patient Details  Name: Denise [name] MRN: [id number] Date of Birth: Jun 11, 1951  Today's Date: 08/24/2023 PT Individual Time: 1301-1341 PT Individual Time Calculation (min): 40 min   Short Term Goals: Week 1:  PT Short Term Goal 1 (Week 1): pt will transfer sit<>stand with LRAD and CGA PT Short Term Goal 2 (Week 1): pt will transfer bed<>chair with LRAD and CGA PT Short Term Goal 3 (Week 1): pt will ambulate 65ft with LRAD and CGA  Skilled Therapeutic Interventions/Progress Updates:   Received pt semi-reclined in bed. Pt reported feeling better this afternoon but freezing cold and requesting to warm up prior to getting OOB. Provided pt with heated blanket, heat pack, and turned up temperature in room. Session with emphasis on functional mobility/transfers, generalized strengthening and endurance, and gait training.   Pt transferred semi-reclined<>sitting R EOB with HOB elevated and use of bedrails with supervision with increased time/effort and use of BUE to move RLE. Donned robe with min A and pt with posterior LOB requiring mod A to correct. Stood from EOB with RW and min A and ambulated 42ft with RW and min A to fatigue - pt limited by pain in RLE, fatigue, and slight lightheadedness. Pt politely declined any further walking due to feeling "wobbly" and requested to return to bed. Pt transferred WC<>bed stand<>pivot with RW and min A and sit<>supine with min A for BLE management. Concluded session with pt semi-reclined in bed, needs within reach, and bed alarm on. Pillow placed in between knees for comfort.   Therapy Documentation Precautions:  Precautions Precautions: Fall Precaution Comments: active cancer wtih chemo port, monitor BP Restrictions Weight Bearing Restrictions: No RLE Weight Bearing: Weight bearing as tolerated  Therapy/Group: Individual Therapy [name] PT, DPT 08/24/2023, 6:57 AM

## 2023-08-24 NOTE — Progress Notes (Signed)
Inpatient Rehabilitation Center Individual Statement of Services  Patient Name:  Denise [name]  Date:  08/24/2023  Welcome to the Inpatient Rehabilitation Center.  Our goal is to provide you with an individualized program based on your diagnosis and situation, designed to meet your specific needs.  With this comprehensive rehabilitation program, you will be expected to participate in at least 3 hours of rehabilitation therapies Monday-Friday, with modified therapy programming on the weekends.  Your rehabilitation program will include the following services:  Physical Therapy (PT), Occupational Therapy (OT), Speech Therapy (ST), 24 hour per day rehabilitation nursing, Therapeutic Recreaction (TR), Neuropsychology, Care Coordinator, Rehabilitation Medicine, Nutrition Services, Pharmacy Services, and Other  Weekly team conferences will be held on Wednesdays to discuss your progress.  Your Inpatient Rehabilitation Care Coordinator will talk with you frequently to get your input and to update you on team discussions.  Team conferences with you and your family in attendance may also be held.  Expected length of stay: 10-12 Days  Overall anticipated outcome:  MOD I  Depending on your progress and recovery, your program may change. Your Inpatient Rehabilitation Care Coordinator will coordinate services and will keep you informed of any changes. Your Inpatient Rehabilitation Care Coordinator's name and contact numbers are listed  below.  The following services may also be recommended but are not provided by the Inpatient Rehabilitation Center:   Home Health Rehabiltiation Services Outpatient Rehabilitation Services    Arrangements will be made to provide these services after discharge if needed.  Arrangements include referral to agencies that provide these services.  Your insurance has been verified to be:   Medicare A & B Your primary doctor is:  Lia Hopping, MD  Pertinent information will  be shared with your doctor and your insurance company.  Inpatient Rehabilitation Care Coordinator:  Lavera Guise, [place] [phone] or [phone]  Information discussed with and copy given to patient by: Andria Rhein, 08/24/2023, 10:42 AM

## 2023-08-25 ENCOUNTER — Inpatient Hospital Stay (HOSPITAL_COMMUNITY): Payer: Medicare Other

## 2023-08-25 DIAGNOSIS — M899 Disorder of bone, unspecified: Secondary | ICD-10-CM | POA: Diagnosis not present

## 2023-08-25 DIAGNOSIS — M7989 Other specified soft tissue disorders: Secondary | ICD-10-CM | POA: Diagnosis not present

## 2023-08-25 LAB — CBC WITH DIFFERENTIAL/PLATELET
Abs Immature Granulocytes: 0.04 10*3/uL (ref 0.00–0.07)
Basophils Absolute: 0 10*3/uL (ref 0.0–0.1)
Basophils Relative: 0 %
Eosinophils Absolute: 0 10*3/uL (ref 0.0–0.5)
Eosinophils Relative: 0 %
HCT: 25.9 % — ABNORMAL LOW (ref 36.0–46.0)
Hemoglobin: 8.8 g/dL — ABNORMAL LOW (ref 12.0–15.0)
Immature Granulocytes: 1 %
Lymphocytes Relative: 11 %
Lymphs Abs: 0.6 10*3/uL — ABNORMAL LOW (ref 0.7–4.0)
MCH: 35.3 pg — ABNORMAL HIGH (ref 26.0–34.0)
MCHC: 34 g/dL (ref 30.0–36.0)
MCV: 104 fL — ABNORMAL HIGH (ref 80.0–100.0)
Monocytes Absolute: 0.3 10*3/uL (ref 0.1–1.0)
Monocytes Relative: 5 %
Neutro Abs: 4.6 10*3/uL (ref 1.7–7.7)
Neutrophils Relative %: 83 %
Platelets: 130 10*3/uL — ABNORMAL LOW (ref 150–400)
RBC: 2.49 MIL/uL — ABNORMAL LOW (ref 3.87–5.11)
RDW: 12.9 % (ref 11.5–15.5)
WBC: 5.5 10*3/uL (ref 4.0–10.5)
nRBC: 0 % (ref 0.0–0.2)

## 2023-08-25 MED ORDER — BISACODYL 5 MG PO TBEC
5.0000 mg | DELAYED_RELEASE_TABLET | Freq: Once | ORAL | Status: AC
  Administered 2023-08-25: 5 mg via ORAL
  Filled 2023-08-25: qty 1

## 2023-08-25 MED ORDER — DRONABINOL 2.5 MG PO CAPS
2.5000 mg | ORAL_CAPSULE | Freq: Every day | ORAL | Status: DC
  Administered 2023-08-25 – 2023-08-30 (×6): 2.5 mg via ORAL
  Filled 2023-08-25 (×6): qty 1

## 2023-08-25 MED ORDER — APIXABAN 5 MG PO TABS
5.0000 mg | ORAL_TABLET | Freq: Two times a day (BID) | ORAL | Status: DC
Start: 2023-09-01 — End: 2023-08-31

## 2023-08-25 MED ORDER — SCOPOLAMINE 1 MG/3DAYS TD PT72
1.0000 | MEDICATED_PATCH | TRANSDERMAL | Status: DC
  Administered 2023-08-25: 1.5 mg via TRANSDERMAL
  Filled 2023-08-25: qty 1

## 2023-08-25 MED ORDER — METHOCARBAMOL 500 MG PO TABS
500.0000 mg | ORAL_TABLET | Freq: Four times a day (QID) | ORAL | Status: DC | PRN
  Administered 2023-08-25 – 2023-08-29 (×2): 500 mg via ORAL
  Filled 2023-08-25 (×2): qty 1

## 2023-08-25 MED ORDER — APIXABAN 5 MG PO TABS
10.0000 mg | ORAL_TABLET | Freq: Two times a day (BID) | ORAL | Status: DC
  Administered 2023-08-25 – 2023-08-31 (×12): 10 mg via ORAL
  Filled 2023-08-25 (×12): qty 2

## 2023-08-25 NOTE — Progress Notes (Signed)
Patient having muscle spasms t/c to Deatra Ina PA to reorder the Robaxin New order received

## 2023-08-25 NOTE — Progress Notes (Signed)
Bilateral lower extremity venous duplex has been completed. Preliminary results can be found in CV Proc through chart review.  Results were given to the patient's nurse, Morrie Sheldon.  08/25/23 2:36 PM Olen Cordial RVT

## 2023-08-25 NOTE — Progress Notes (Signed)
Occupational Therapy Session Note  Patient Details  Name: Denise [name] MRN: [id number] Date of Birth: 04-10-1951  Today's Date: 08/25/2023 OT Individual Time: 1050-1130 & 1305-1405 OT Individual Time Calculation (min): 40 min & 60 min   Short Term Goals: Week 1:  OT Short Term Goal 1 (Week 1): Pt will complete sit > stand in prep for ADL with CGA using LRAD OT Short Term Goal 2 (Week 1): Pt will thread LB clothing with AE PRN, with supervision OT Short Term Goal 3 (Week 1): Pt will complete toilet transfer with CGA using LRAD OT Short Term Goal 4 (Week 1): Pt will improve endurance to sitting EOB/sink for at least 10 mins to promote ability to complete bathing at shower level  Skilled Therapeutic Interventions/Progress Updates:  Session 1 Skilled OT intervention completed with focus on activity tolerance, nutrition education and intervention. Pt received upright in bed, reporting fatigue and weakness. No pain reported.  With enticement for a "surprise," pt agreeable to sit EOB. Able to do so with supervision with increased time. Had Dixie the therapy dog visit pt which pt responded well to and pt engaged in petting the dog, talked about her dog at home, while sitting unsupported with supervision. However verbalized weakness.  No nausea however pt reported she hasn't been eating. Offered ginger ale and saltine crackers and pt did attempt the crackers (per clearance of regular diet) however unable to chew due to lack of donning (per preference) dentures as pt reports immediate vomiting with meal consumption when they are in. Pt reported difficulty with swallowing, and fluids remaining in throat region with difficulty expelling with active cough. Pt removed bolus of cracker, and OT recommended textures of foods including icecream and yogurt. Retrieved both of those, and pt was able to consume a few bites of each while we discussed ways to modify her diet for nutrition intake and OT educated  pt on SLP purpose and consult for potential further intervention to ensure pt has adequate intake if appropriate. Discussed disease progression and that palliative may be more appropriate intervention to help guide her in decisions on what her POC needs to be. MD/SLP notified of recommendation.   Transitioned supine with min A for RLE. Pt remained semi upright in bed, with bed alarm on/activated, and with all needs in reach at end of session.  Session 2 Skilled OT intervention completed with focus on ADL retraining, functional endurance, and mobility within a shower context. Pt received supine in bed, agreeable to session. No pain reported however did have episode of nausea without vomiting.   OT offered a shower to boost overall well being. Pt completed all sit > stands, stand pivot and short ambulatory transfers with CGA/min A using RW during session with cues needed for RLE positioning and sequencing.  Transitioned to EOB with min A for RLE. Transfer to w/c, then stand pivot > shower chair using grab bar for balance.   Waterproof cover applied to Rt hip surgical dressing prior to shower; chemo port located subcutaneously. Pt was able to bathe all parts with min A for thoroughness of feet. Pt did not stand due to fatigue, therefore used lateral leans for periareas with supervision. Utilized long handled sponge to access BLE. Education needed on use of luke warm water for energy conservation and nausea management however pt with extreme comfort "to the bone" with hot water and preferred it. Cues needed for sequencing and accessing all parts. Stand pivot transfer using grab bar > w/c. Able  to donn shirt set up A. Threaded LB clothing with min A for over BLE, then stood with RW, CGA for balance while pt donned over hips.  After dressing, pt verbalized nausea; emesis bag offered however improved with rest. BP assessed; 106/83. Discussed possible causes of nausea, and offered attempt for BM however pt  politely declined. Vascular present for ultrasound with request to return pt to bed with pants removed.  NT present to change bed linens, then pt completed stand pivot to bed, min A for RLE into bed, then min A to lower pants for time. Pt remained supine in bed awaiting vascular outside door, with bed alarm on/activated, and with all needs in reach at end of session.   Therapy Documentation Precautions:  Precautions Precautions: Fall Precaution Comments: active cancer wtih chemo port, monitor BP Restrictions Weight Bearing Restrictions: No RLE Weight Bearing: Weight bearing as tolerated    Therapy/Group: Individual Therapy  Melvyn Novas, MS, OTR/L  08/25/2023, 3:47 PM

## 2023-08-25 NOTE — Progress Notes (Signed)
PROGRESS NOTE   Subjective/Complaints: Very nauseous this morning, scopolamine patch ordered IV is painful for her Hgb dropped to 8.8   ROS: +lightheadedness, +nausea   Objective:   No results found. Recent Labs    08/24/23 0609 08/25/23 0550  WBC 7.1 5.5  HGB 9.1* 8.8*  HCT 28.0* 25.9*  PLT 123* 130*   Recent Labs    08/24/23 0609  NA 131*  K 4.5  CL 104  CO2 22  GLUCOSE 67*  BUN 20  CREATININE 0.63  CALCIUM 7.7*    Intake/Output Summary (Last 24 hours) at 08/25/2023 0932 Last data filed at 08/25/2023 0700 Gross per 24 hour  Intake 295 ml  Output --  Net 295 ml        Physical Exam: Vital Signs Blood pressure 127/83, pulse 68, temperature 98 F (36.7 C), resp. rate 18, SpO2 97%. Skin:    Comments: Right hip incision dressed.  Appropriately tender  Neurological:     Comments: Patient is alert oriented x 3 and follows commands.      General: No acute distress Mood and affect are appropriate Heart: Regular rate and rhythm no rubs murmurs or extra sounds, +orthostasis Lungs: Clear to auscultation, breathing unlabored, no rales or wheezes Abdomen: Positive bowel sounds, soft nontender to palpation, nondistended Extremities: No clubbing, cyanosis, or edema Skin: No evidence of breakdown, no evidence of rash Neurologic: mildly delayed responses with mild dysarthria motor strength is 5/5 in bilateral deltoid, bicep, tricep, grip, hip flexor, 4/5 left and trace right knee extensors, 5/5 B ankle dorsiflexor and plantar flexor Sensory exam normal sensation to light touch  in bilateral upper and lower extremities, stable 12/11   Musculoskeletal: Full range of motion in BUE No joint swelling Post op Right thigh edema with ecchymosis posterior to hip incisions Fullness and mild tenderness Right popliteal fossa     Assessment/Plan: 1. Functional deficits which require 3+ hours per day of  interdisciplinary therapy in a comprehensive inpatient rehab setting. Physiatrist is providing close team supervision and 24 hour management of active medical problems listed below. Physiatrist and rehab team continue to assess barriers to discharge/monitor patient progress toward functional and medical goals  Care Tool:  Bathing    Body parts bathed by patient: Right arm, Left arm, Chest, Abdomen, Front perineal area, Buttocks, Right upper leg, Left upper leg, Face   Body parts bathed by helper: Right lower leg, Left lower leg     Bathing assist Assist Level: Minimal Assistance - Patient > 75%     Upper Body Dressing/Undressing Upper body dressing   What is the patient wearing?: Pull over shirt    Upper body assist Assist Level: Supervision/Verbal cueing    Lower Body Dressing/Undressing Lower body dressing      What is the patient wearing?: Pants     Lower body assist Assist for lower body dressing: Minimal Assistance - Patient > 75%     Toileting Toileting    Toileting assist Assist for toileting: Moderate Assistance - Patient 50 - 74%     Transfers Chair/bed transfer  Transfers assist     Chair/bed transfer assist level: Minimal Assistance - Patient > 75%  Locomotion Ambulation   Ambulation assist   Ambulation activity did not occur: Safety/medical concerns (lightheadedness, nausea, headache)  Assist level: Minimal Assistance - Patient > 75% Assistive device: Walker-rolling Max distance: 91ft   Walk 10 feet activity   Assist  Walk 10 feet activity did not occur: Safety/medical concerns (lightheadedness, nausea, headache)  Assist level: Minimal Assistance - Patient > 75% Assistive device: Walker-rolling   Walk 50 feet activity   Assist Walk 50 feet with 2 turns activity did not occur: Safety/medical concerns (lightheadedness, nausea, headache)         Walk 150 feet activity   Assist Walk 150 feet activity did not occur:  Safety/medical concerns (lightheadedness, nausea, headache)         Walk 10 feet on uneven surface  activity   Assist Walk 10 feet on uneven surfaces activity did not occur: Safety/medical concerns (lightheadedness, nausea, headache)         Wheelchair     Assist Is the patient using a wheelchair?: Yes Type of Wheelchair: Manual    Wheelchair assist level: Dependent - Patient 0% Max wheelchair distance: >340ft    Wheelchair 50 feet with 2 turns activity    Assist        Assist Level: Dependent - Patient 0%   Wheelchair 150 feet activity     Assist      Assist Level: Dependent - Patient 0%   Blood pressure 127/83, pulse 68, temperature 98 F (36.7 C), resp. rate 18, SpO2 97%.  Medical Problem List and Plan: 1. Functional deficits secondary to metastatic breast cancer with metastasis to right femur.  Status post prophylactic intramedullary nail of right femur for right femur lytic lesions 08/20/2023.  Weightbearing as tolerated             -patient may  shower starting 12/10             -ELOS/Goals: 10-12d  D3 ordered 2.  Antithrombotics: -DVT/anticoagulation:  Mechanical: Antiembolism stockings, thigh (TED hose) Bilateral lower extremities, check doppler LEs             -antiplatelet therapy: Aspirin 81 mg twice daily 3. Headache: Fioricet ordered x1, topamax ordered to replace gabapentin HS  4. Mood/Behavior/Sleep: Provide emotional support             -antipsychotic agents: N/A 5. Neuropsych/cognition: This patient is capable of making decisions on her own behalf. 6. Skin/Wound Care: Routine skin checks 7. Fluids/Electrolytes/Nutrition: Routine in and outs with follow-up chemistries 8.  Acute blood loss anemia.  Follow-up CBC 9.  Hypothyroidism.  Synthroid  10.  Orthostasis:  D/c prn anti-hypertensives, teds ordered   11.  Peripheral neuropathy due to chemotherapy   12.  Macrocytic anemia: B12 reviewed and elevated, will add oral B  complex  13.  Hx metastatic brain lesion s/p radiation therapy Oct 2023. Palliative care consulted  14.  Dysphagia like due to radiation therapy D3 diet  15. Nausea: scopolamine patch ordered  16. Pain from IV: placed order for IV removal  17. Constipation: dulcolax ordered     LOS: 2 days A FACE TO FACE EVALUATION WAS PERFORMED  Drema Pry Marielle Mantione 08/25/2023, 9:32 AM

## 2023-08-25 NOTE — Progress Notes (Signed)
   08/25/23 1438  Spiritual Encounters  Type of Visit Initial  Care provided to: Patient  Referral source Patient request  Reason for visit Advance directives  OnCall Visit No   Visited patient as consult stated patient wanted to change HCPOA, however after discussion patient stated she wanted her daughter to remain HCPOA as she is aware of all her wishes and needs. 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  No changes were made today.   Patient requested warm blankets, provided patient with fresh heated blankets.

## 2023-08-25 NOTE — Progress Notes (Signed)
Patient ID: Denise [name], female   DOB: Dec 01, 1950, 72 y.o.   MRN: [id number]  Team Conference Report to Patient/Family  Team Conference discussion was reviewed with the patient and caregiver, including goals, any changes in plan of care and target discharge date.  Patient and caregiver express understanding and are in agreement.  The patient has a target discharge date of 08/31/23.  Sw met with patient and spoke with daughter via telephone.  Daughter anticipates attending family education Saturday 9-12. Daughter will follow up with SW to confirm. No additional questions or concerns currently.   Andria Rhein 08/25/2023, 2:10 PM

## 2023-08-25 NOTE — Progress Notes (Signed)
PHARMACY - ANTICOAGULATION CONSULT NOTE  Pharmacy Consult for apixaban Indication: DVT  Allergies  Allergen Reactions   Exforge [Amlodipine Besylate-Valsartan] Nausea And Vomiting   Gadolinium Derivatives Anaphylaxis, Hives and Itching    Pt should not have MRI contrast in outpt facility. Pt developed hives and itching AFTER taking 13 hr prep. Per Dr. Charise Killian. 04/23/23   Gadopiclenol Hives and Itching    13 hr prep prior to contrast admin    Tape Other (See Comments)    Redness and Irritation   Cheese     Hard cheese   Levofloxacin In D5w Hives   Other    Proanthocyanidin Other (See Comments)   Strawberry Extract Diarrhea    Seeds,nuts, lettuce, grapes   Wild Lettuce Extract (Lactuca Virosa)    Yeast-Derived Drug Products Hives    Mold on bread    Patient Measurements:   Heparin Dosing Weight:   Vital Signs: Temp: 98.2 F (36.8 C) (12/11 1300) BP: 111/60 (12/11 1300) Pulse Rate: 74 (12/11 1300)  Labs: Recent Labs    08/24/23 0609 08/25/23 0550  HGB 9.1* 8.8*  HCT 28.0* 25.9*  PLT 123* 130*  CREATININE 0.63  --     Estimated Creatinine Clearance: 61.6 mL/min (by C-G formula based on SCr of 0.63 mg/dL).   Medical History: Past Medical History:  Diagnosis Date   Asthma    as child   Cancer (HCC) 12/2020   left breast IMC   Complication of anesthesia    pateitn states,' I coded when I had my D&Cmany years ago.   Dyspnea    Family history of breast cancer    Hypertension    Hypothyroidism    PONV (postoperative nausea and vomiting)    Port-A-Cath in place 02/26/2021    Medications:  Medications Prior to Admission  Medication Sig Dispense Refill Last Dose   aspirin 81 MG chewable tablet Chew 1 tablet (81 mg total) by mouth 2 (two) times daily. 84 tablet 0    aspirin-acetaminophen-caffeine (EXCEDRIN MIGRAINE) 250-250-65 MG tablet Take 1-2 tablets by mouth every 8 (eight) hours as needed for headache or migraine.      cetirizine (ZYRTEC) 10 MG tablet Take  10 mg by mouth daily as needed for allergies.      dexamethasone (DECADRON) 4 MG tablet TAKE 1 TABLET BY MOUTH TWICE DAILY WITH MEALS 60 tablet 1    gabapentin (NEURONTIN) 300 MG capsule Take 300 mg by mouth at bedtime.      hydrALAZINE (APRESOLINE) 25 MG tablet Take 25 mg by mouth 3 (three) times daily as needed (if systolic bp is greater than 180 or diastolic blood pressure greater than 90).      levothyroxine (SYNTHROID) 75 MCG tablet Take 75 mcg by mouth daily before breakfast.      lidocaine (LIDODERM) 5 % Place 1 patch onto the skin daily as needed (pain.). Remove & Discard patch within 12 hours or as directed by MD      lidocaine (XYLOCAINE) 2 % solution Use as directed 15 mLs in the mouth or throat as needed (reflux). 100 mL 2    Metamucil Fiber CHEW Chew 1 tablet by mouth daily as needed (constipation.).      metoCLOPramide (REGLAN) 10 MG tablet Take 1 tablet (10 mg total) by mouth every 6 (six) hours as needed for nausea or vomiting. 30 tablet 0    olmesartan-hydrochlorothiazide (BENICAR HCT) 40-25 MG tablet Take 1 tablet by mouth daily as needed (elevated bp).  ondansetron (ZOFRAN) 4 MG tablet Take 1 tablet (4 mg total) by mouth every 8 (eight) hours as needed for nausea or vomiting. 30 tablet 3    oxyCODONE-acetaminophen (PERCOCET) 5-325 MG tablet Take 1-2 tablets by mouth 2 (two) times daily as needed for severe pain (pain score 7-10). 20 tablet 0    pantoprazole (PROTONIX) 40 MG tablet Take 1 tablet (40 mg total) by mouth 2 (two) times daily before a meal. 60 tablet 3    phenol (CHLORASEPTIC) 1.4 % LIQD Use as directed 1 spray in the mouth or throat as needed for throat irritation / pain.      prochlorperazine (COMPAZINE) 10 MG tablet Take 1 tablet (10 mg total) by mouth 2 (two) times daily as needed for nausea or vomiting. (Patient taking differently: Take 10 mg by mouth at bedtime.) 10 tablet 0    promethazine (PHENERGAN) 25 MG suppository Place 1 suppository (25 mg total) rectally  every 6 (six) hours as needed for nausea or vomiting. (Patient taking differently: Place 25 mg rectally daily as needed for nausea or vomiting.) 12 each 0    sertraline (ZOLOFT) 25 MG tablet Take 1 tablet (25 mg total) by mouth daily. (Patient taking differently: Take 25 mg by mouth at bedtime.) 90 tablet 3    sucralfate (CARAFATE) 1 g tablet Take 1 tablet (1 g total) by mouth every 4 (four) hours as needed. (Patient taking differently: Take 1 g by mouth 2 (two) times daily as needed (indigestion/heartburn.).) 180 tablet 0    Scheduled:   aspirin  81 mg Oral BID   B-complex with vitamin C  1 tablet Oral Daily   cholecalciferol  1,000 Units Oral Daily   dexamethasone  4 mg Oral BID WC   diclofenac Sodium  4 g Topical QID   docusate sodium  100 mg Oral BID   dronabinol  2.5 mg Oral QAC lunch   levothyroxine  75 mcg Oral QAC breakfast   pantoprazole  40 mg Oral BID AC   scopolamine  1 patch Transdermal Q72H   sertraline  25 mg Oral QHS   topiramate  25 mg Oral QHS   Infusions:   Assessment: Pt with metastatic adenocarcinoma who is s/p IM nail on 12/6. Found to have right DVT. Apixaban ordered.  Hgb 8.8, plt 130  Goal of Therapy:  Monitor platelets by anticoagulation protocol: Yes   Plan:  Apixaban 10mg  PO BID x7d then 5mg  BID Rx will follow peripherally Check copay  Ulyses Southward, PharmD, BCIDP, AAHIVP, CPP Infectious Disease Pharmacist 08/25/2023 4:02 PM

## 2023-08-25 NOTE — Progress Notes (Signed)
Physical Therapy Session Note  Patient Details  Name: Denise [name] MRN: [id number] Date of Birth: 1951-06-01  Skilled Therapeutic Interventions/Progress Updates:   Attempted to see pt to make up missed minutes. Received pt with OT, then handoff to vascular for ultrasound, ultimately requesting therapist return in 10-15 minutes. Upon returning, ultrasound revealing DVT in RLE and RN relaying results to MD. Therefore was unable to make up missed time with pt. Will attempt again as pt becomes available and medically stable.   Therapy Documentation Precautions:  Precautions Precautions: Fall Precaution Comments: active cancer wtih chemo port, monitor BP Restrictions Weight Bearing Restrictions: No RLE Weight Bearing: Weight bearing as tolerated  Therapy/Group: Individual Therapy [name] PT, DPT 08/25/2023, 2:01 PM

## 2023-08-25 NOTE — Plan of Care (Signed)
  Problem: Consults Goal: RH GENERAL PATIENT EDUCATION Description: See Patient Education module for education specifics. Outcome: Progressing   Problem: RH BOWEL ELIMINATION Goal: RH STG MANAGE BOWEL WITH ASSISTANCE Description: STG Manage Bowel with mod I Assistance. Outcome: Progressing Goal: RH STG MANAGE BOWEL W/MEDICATION W/ASSISTANCE Description: STG Manage Bowel with Medication with mod I Assistance. Outcome: Progressing   Problem: RH SAFETY Goal: RH STG ADHERE TO SAFETY PRECAUTIONS W/ASSISTANCE/DEVICE Description: STG Adhere to Safety Precautions With cues Assistance/Device. Outcome: Progressing   Problem: RH PAIN MANAGEMENT Goal: RH STG PAIN MANAGED AT OR BELOW PT'S PAIN GOAL Description: < 4 with prns Outcome: Progressing   Problem: RH KNOWLEDGE DEFICIT GENERAL Goal: RH STG INCREASE KNOWLEDGE OF SELF CARE AFTER HOSPITALIZATION Description: Patient will be able to manage care at discharge using educational resources for medications and dietary modification, independently Outcome: Progressing

## 2023-08-25 NOTE — Progress Notes (Signed)
Physical Therapy Session Note  Patient Details  Name: Denise [name] MRN: [id number] Date of Birth: [birth year]  Today's Date: 08/25/2023 PT Missed Time: 45 Minutes Missed Time Reason: MD hold (Comment)  Per Jesusita Oka, [place] pt is to hold therapy at this time due to findings of R LE DVT on ultrasound. Missed 45 minutes of skilled physical therapy.  Ginny Forth , PT, DPT, NCS, CSRS 08/25/2023, 3:30 PM

## 2023-08-25 NOTE — Progress Notes (Signed)
Recreational Therapy Session Note  Patient Details  Name: Denise [name] MRN: [id number] Date of Birth: November 23, 1950 Today's Date: 08/25/2023  Pain:no c/o  Pt participated in animal assisted activity seated EOB with OT.  Pt stated not feeling well but easily engaged with pet partner team.  Pt sharing with team about her personal dog and appreciative of this visit.   Gordon Vandunk 08/25/2023, 3:34 PM

## 2023-08-25 NOTE — Progress Notes (Signed)
Physical Therapy Session Note  Patient Details  Name: Denise [name] MRN: [id number] Date of Birth: 1951/07/09  Today's Date: 08/25/2023 PT Individual Time: 4098-1191 PT Individual Time Calculation (min): 45 min  Today's Date: 08/25/2023 PT Missed Time: 15 Minutes Missed Time Reason: Patient ill (Comment);Patient fatigue (nauseous, constipated)  Short Term Goals: Week 1:  PT Short Term Goal 1 (Week 1): pt will transfer sit<>stand with LRAD and CGA PT Short Term Goal 2 (Week 1): pt will transfer bed<>chair with LRAD and CGA PT Short Term Goal 3 (Week 1): pt will ambulate 58ft with LRAD and CGA  Skilled Therapeutic Interventions/Progress Updates:   Received pt semi-reclined in bed and agreeable to PT treatment. Pt reported not sleeping well last night and fatigued this morning. Pt also feeling lightheaded, nauseous, and constipated this morning - care team aware. Session with emphasis on functional mobility/transfers, generalized strengthening and endurance, and gait training. Pt transferred semi-reclined<>sitting R EOB with HOB elevated and use of bedrails with supervision and increased time/effort. Pt reported having no appetite and not eating breakfast, dinner, and unsure of if she ate lunch yesterday - provided pt with ensure and encouraged pt to eat. Pt required significantly increased time with all mobility this morning due to feeling ill. Pt stood from low sitting EOB with RW and min A and ambulated 27ft with RW and min A to fatigue, pt then reported increased nausea and with 1 small emesis episode. Care coordinator present, MD arrived for morning rounds, and RN present to remove IV from R hand. MD ordering patch for nausea and medication for constipation.   Pt requested to remain in room and sitting up in WC. Discussed D/C plan and pt reports having 3 children (2 live far away and are unable to assist) and 1 daughter who lives here. Pt reports she cannot stay with her daughter and her  daughter can't stay with her as she works and homeschools her children. Pt reports she thinks she will be "much better" once her constipation resolves. Concluded session with pt sitting in WC, needs within reach, and seatbelt alarm on. 15 minutes missed of skilled physical therapy due to fatigue, nausea, and constipation.    Therapy Documentation Precautions:  Precautions Precautions: Fall Precaution Comments: active cancer wtih chemo port, monitor BP Restrictions Weight Bearing Restrictions: No RLE Weight Bearing: Weight bearing as tolerated  Therapy/Group: Individual Therapy [name] PT, DPT 08/25/2023, 6:57 AM

## 2023-08-26 ENCOUNTER — Inpatient Hospital Stay (HOSPITAL_COMMUNITY): Payer: Medicare Other

## 2023-08-26 ENCOUNTER — Other Ambulatory Visit (HOSPITAL_COMMUNITY): Payer: Self-pay

## 2023-08-26 ENCOUNTER — Encounter: Payer: Self-pay | Admitting: Hematology

## 2023-08-26 ENCOUNTER — Telehealth (HOSPITAL_COMMUNITY): Payer: Self-pay | Admitting: Pharmacy Technician

## 2023-08-26 ENCOUNTER — Encounter (HOSPITAL_COMMUNITY): Payer: Self-pay | Admitting: Hematology

## 2023-08-26 DIAGNOSIS — Z7189 Other specified counseling: Secondary | ICD-10-CM | POA: Diagnosis not present

## 2023-08-26 DIAGNOSIS — Z515 Encounter for palliative care: Secondary | ICD-10-CM | POA: Diagnosis not present

## 2023-08-26 DIAGNOSIS — C50919 Malignant neoplasm of unspecified site of unspecified female breast: Secondary | ICD-10-CM | POA: Diagnosis not present

## 2023-08-26 DIAGNOSIS — M899 Disorder of bone, unspecified: Secondary | ICD-10-CM | POA: Diagnosis not present

## 2023-08-26 LAB — CBC WITH DIFFERENTIAL/PLATELET
Abs Immature Granulocytes: 0.07 10*3/uL (ref 0.00–0.07)
Basophils Absolute: 0 10*3/uL (ref 0.0–0.1)
Basophils Relative: 0 %
Eosinophils Absolute: 0 10*3/uL (ref 0.0–0.5)
Eosinophils Relative: 0 %
HCT: 25.8 % — ABNORMAL LOW (ref 36.0–46.0)
Hemoglobin: 8.5 g/dL — ABNORMAL LOW (ref 12.0–15.0)
Immature Granulocytes: 1 %
Lymphocytes Relative: 16 %
Lymphs Abs: 1.1 10*3/uL (ref 0.7–4.0)
MCH: 34.7 pg — ABNORMAL HIGH (ref 26.0–34.0)
MCHC: 32.9 g/dL (ref 30.0–36.0)
MCV: 105.3 fL — ABNORMAL HIGH (ref 80.0–100.0)
Monocytes Absolute: 0.5 10*3/uL (ref 0.1–1.0)
Monocytes Relative: 8 %
Neutro Abs: 5 10*3/uL (ref 1.7–7.7)
Neutrophils Relative %: 75 %
Platelets: 155 10*3/uL (ref 150–400)
RBC: 2.45 MIL/uL — ABNORMAL LOW (ref 3.87–5.11)
RDW: 12.9 % (ref 11.5–15.5)
WBC: 6.7 10*3/uL (ref 4.0–10.5)
nRBC: 0 % (ref 0.0–0.2)

## 2023-08-26 LAB — URINALYSIS, ROUTINE W REFLEX MICROSCOPIC
Bilirubin Urine: NEGATIVE
Glucose, UA: NEGATIVE mg/dL
Hgb urine dipstick: NEGATIVE
Ketones, ur: NEGATIVE mg/dL
Leukocytes,Ua: NEGATIVE
Nitrite: NEGATIVE
Protein, ur: NEGATIVE mg/dL
Specific Gravity, Urine: 1.021 (ref 1.005–1.030)
pH: 5 (ref 5.0–8.0)

## 2023-08-26 MED ORDER — SODIUM CHLORIDE 0.9% FLUSH: 10.0000 mL | INTRAVENOUS | Status: DC | PRN

## 2023-08-26 MED ORDER — SODIUM CHLORIDE 0.9% FLUSH
10.0000 mL | Freq: Two times a day (BID) | INTRAVENOUS | Status: DC
  Administered 2023-08-26: 20 mL
  Administered 2023-08-28 – 2023-08-29 (×2): 10 mL

## 2023-08-26 MED ORDER — CHLORHEXIDINE GLUCONATE CLOTH 2 % EX PADS
6.0000 | MEDICATED_PAD | Freq: Two times a day (BID) | CUTANEOUS | Status: DC
  Administered 2023-08-26 – 2023-08-31 (×10): 6 via TOPICAL

## 2023-08-26 MED ORDER — CHLORHEXIDINE GLUCONATE CLOTH 2 % EX PADS
6.0000 | MEDICATED_PAD | Freq: Every day | CUTANEOUS | Status: DC
  Administered 2023-08-26: 6 via TOPICAL

## 2023-08-26 NOTE — Progress Notes (Signed)
PROGRESS NOTE   Subjective/Complaints: AMS this morning, CT is stable, consulted palliative as patient's status and function are declining, unfortunately she sis not want palliative to call her daughter   ROS: +lightheadedness, +nausea, +somnolence   Objective:   CT HEAD WO CONTRAST ( ) Result Date: 08/26/2023 CLINICAL DATA:  Altered mental status, nontraumatic (Ped 0-17y) EXAM: CT HEAD WITHOUT CONTRAST TECHNIQUE: Contiguous axial images were obtained from the base of the skull through the vertex without intravenous contrast. RADIATION DOSE REDUCTION: This exam was performed according to the departmental dose-optimization program which includes automated exposure control, adjustment of the mA and/or kV according to patient size and/or use of iterative reconstruction technique. COMPARISON:  Prior bone scan dated 06/29/2023, brain MR 04/23/2023 FINDINGS: Brain: No hemorrhage. No hydrocephalus. Extra-axial fluid collection. No CT evidence of an acute cortical infarct. No mass effect. No mass lesion. There is a background of mild chronic microvascular ischemic change. Vascular: No hyperdense vessel or unexpected calcification. Skull: There are scattered lucent calvarial lesions, most notable of which is in the right occipital bone (series 4, image 20), worrisome for osseous metastatic disease. Sinuses/Orbits: Large right mastoid effusion and middle ear effusion. No left-sided mastoid or middle ear effusion. Paranasal sinuses are clear. Left lens replacement. Orbits are otherwise unremarkable. Other: None. IMPRESSION: 1. No CT finding to explain altered mental status 2. Scattered lucent calvarial lesions, most notable of which is in the right occipital bone, worrisome for osseous metastatic disease. 3. Large right mastoid and middle ear effusion, unchanged from prior brain MRI. Electronically Signed   By: Lorenza Cambridge M.D.   On: 08/26/2023 10:33    VAS [place] LOWER EXTREMITY VENOUS (DVT) Result Date: 08/25/2023  Lower Venous DVT Study Patient Name:  Denise [name]  Date of Exam:   08/25/2023 Medical Rec #: [id number]          Accession #:    [id number] Date of Birth: 01-08-1951          Patient Gender: F Patient Age:   71 years Exam Location:  [facility] Procedure:      VAS [place] LOWER EXTREMITY VENOUS (DVT) Referring Phys: Mariam Dollar --------------------------------------------------------------------------------  Indications: Swelling.  Risk Factors: Cancer. Comparison Study: No prior studies. Performing Technologist: Chanda Busing RVT  Examination Guidelines: A complete evaluation includes B-mode imaging, spectral Doppler, color Doppler, and power Doppler as needed of all accessible portions of each vessel. Bilateral testing is considered an integral part of a complete examination. Limited examinations for reoccurring indications may be performed as noted. The reflux portion of the exam is performed with the patient in reverse Trendelenburg.  +---------+---------------+---------+-----------+----------+--------------+ RIGHT    CompressibilityPhasicitySpontaneityPropertiesThrombus Aging +---------+---------------+---------+-----------+----------+--------------+ CFV      Partial        Yes      Yes                  Acute          +---------+---------------+---------+-----------+----------+--------------+ SFJ      Full                                                        +---------+---------------+---------+-----------+----------+--------------+  FV Prox  None           No       No                   Acute          +---------+---------------+---------+-----------+----------+--------------+ FV Mid   None           No       No                   Acute          +---------+---------------+---------+-----------+----------+--------------+ FV DistalNone           No       No                   Acute           +---------+---------------+---------+-----------+----------+--------------+ PFV      Full                                                        +---------+---------------+---------+-----------+----------+--------------+ POP      None           No       No                   Acute          +---------+---------------+---------+-----------+----------+--------------+ PTV      None                                         Acute          +---------+---------------+---------+-----------+----------+--------------+ PERO     Partial                                      Acute          +---------+---------------+---------+-----------+----------+--------------+ Gastroc  Partial                                      Acute          +---------+---------------+---------+-----------+----------+--------------+   +---------+---------------+---------+-----------+----------+--------------+ LEFT     CompressibilityPhasicitySpontaneityPropertiesThrombus Aging +---------+---------------+---------+-----------+----------+--------------+ CFV      Full           Yes      Yes                                 +---------+---------------+---------+-----------+----------+--------------+ SFJ      Full                                                        +---------+---------------+---------+-----------+----------+--------------+ FV Prox  Full                                                        +---------+---------------+---------+-----------+----------+--------------+  FV Mid   Full                                                        +---------+---------------+---------+-----------+----------+--------------+ FV DistalFull                                                        +---------+---------------+---------+-----------+----------+--------------+ PFV      Full                                                         +---------+---------------+---------+-----------+----------+--------------+ POP      Full           Yes      Yes                                 +---------+---------------+---------+-----------+----------+--------------+ PTV      Full                                                        +---------+---------------+---------+-----------+----------+--------------+ PERO     Full                                                        +---------+---------------+---------+-----------+----------+--------------+     Summary: RIGHT: - Findings consistent with acute deep vein thrombosis involving the right common femoral vein, right femoral vein, right popliteal vein, right posterior tibial veins, and right peroneal veins. Findings consistent with acute intramuscular thrombosis involving the right gastrocnemius veins. - No cystic structure found in the popliteal fossa.  LEFT: - There is no evidence of deep vein thrombosis in the lower extremity.  - No cystic structure found in the popliteal fossa.  *See table(s) above for measurements and observations. Electronically signed by Coral Else MD on 08/25/2023 at 9:09:36 PM.    Final    Recent Labs    08/25/23 0550 08/26/23 0432  WBC 5.5 6.7  HGB 8.8* 8.5*  HCT 25.9* 25.8*  PLT 130* 155   Recent Labs    08/24/23 0609  NA 131*  K 4.5  CL 104  CO2 22  GLUCOSE 67*  BUN 20  CREATININE 0.63  CALCIUM 7.7*    Intake/Output Summary (Last 24 hours) at 08/26/2023 1330 Last data filed at 08/26/2023 1231 Gross per 24 hour  Intake 476 ml  Output 1500 ml  Net -1024 ml        Physical Exam: Vital Signs Blood pressure 137/80, pulse 82, temperature 100.1 F (37.8 C), temperature source Oral, resp. rate 18, SpO2 97%. Skin:    Comments: Right hip incision dressed.  Appropriately tender  Neurological:     Comments: Patient is alert and follows commands, but more fatigued and with delayed processing today     General: No acute  distress Mood and affect are appropriate Heart: Regular rate and rhythm no rubs murmurs or extra sounds, +orthostasis Lungs: Clear to auscultation, breathing unlabored, no rales or wheezes Abdomen: Positive bowel sounds, soft nontender to palpation, nondistended Extremities: No clubbing, cyanosis, or edema Skin: No evidence of breakdown, no evidence of rash Neurologic: mildly delayed responses with mild dysarthria motor strength is 5/5 in bilateral deltoid, bicep, tricep, grip, hip flexor, 3/5 in lower extremities Sensory exam normal sensation to light touch  in bilateral upper and lower extremities   Musculoskeletal: Full range of motion in BUE No joint swelling Post op Right thigh edema with ecchymosis posterior to hip incisions Fullness and mild tenderness Right popliteal fossa     Assessment/Plan: 1. Functional deficits which require 3+ hours per day of interdisciplinary therapy in a comprehensive inpatient rehab setting. Physiatrist is providing close team supervision and 24 hour management of active medical problems listed below. Physiatrist and rehab team continue to assess barriers to discharge/monitor patient progress toward functional and medical goals  Care Tool:  Bathing    Body parts bathed by patient: Right arm, Left arm, Chest, Abdomen, Front perineal area, Buttocks, Right upper leg, Left upper leg, Face   Body parts bathed by helper: Right lower leg, Left lower leg     Bathing assist Assist Level: Minimal Assistance - Patient > 75%     Upper Body Dressing/Undressing Upper body dressing   What is the patient wearing?: Pull over shirt    Upper body assist Assist Level: Supervision/Verbal cueing    Lower Body Dressing/Undressing Lower body dressing      What is the patient wearing?: Pants     Lower body assist Assist for lower body dressing: Minimal Assistance - Patient > 75%     Toileting Toileting    Toileting assist Assist for toileting: Moderate  Assistance - Patient 50 - 74%     Transfers Chair/bed transfer  Transfers assist     Chair/bed transfer assist level: Minimal Assistance - Patient > 75%     Locomotion Ambulation   Ambulation assist   Ambulation activity did not occur: Safety/medical concerns (lightheadedness, nausea, headache)  Assist level: Minimal Assistance - Patient > 75% Assistive device: Walker-rolling Max distance: 88ft   Walk 10 feet activity   Assist  Walk 10 feet activity did not occur: Safety/medical concerns (lightheadedness, nausea, headache)  Assist level: Minimal Assistance - Patient > 75% Assistive device: Walker-rolling   Walk 50 feet activity   Assist Walk 50 feet with 2 turns activity did not occur: Safety/medical concerns (lightheadedness, nausea, headache)         Walk 150 feet activity   Assist Walk 150 feet activity did not occur: Safety/medical concerns (lightheadedness, nausea, headache)         Walk 10 feet on uneven surface  activity   Assist Walk 10 feet on uneven surfaces activity did not occur: Safety/medical concerns (lightheadedness, nausea, headache)         Wheelchair     Assist Is the patient using a wheelchair?: Yes Type of Wheelchair: Manual    Wheelchair assist level: Dependent - Patient 0% Max wheelchair distance: >369ft    Wheelchair 50 feet with 2 turns activity    Assist        Assist Level: Dependent - Patient 0%  Wheelchair 150 feet activity     Assist      Assist Level: Dependent - Patient 0%   Blood pressure 137/80, pulse 82, temperature 100.1 F (37.8 C), temperature source Oral, resp. rate 18, SpO2 97%.  Medical Problem List and Plan: 1. Functional deficits secondary to metastatic breast cancer with metastasis to right femur.  Status post prophylactic intramedullary nail of right femur for right femur lytic lesions 08/20/2023.  Weightbearing as tolerated             -patient may  shower starting  12/10             -ELOS/Goals: 10-12d  D3 ordered 2.  Multiple DVTs: Eliquis started 3. Headache: Fioricet ordered x1, topamax ordered to replace gabapentin HS. Resolved.   4. Mood/Behavior/Sleep: Provide emotional support             -antipsychotic agents: N/A 5. Neuropsych/cognition: This patient is capable of making decisions on her own behalf. 6. Skin/Wound Care: Routine skin checks 7. Fluids/Electrolytes/Nutrition: Routine in and outs with follow-up chemistries  8.  Acute blood loss anemia.  Follow-up CBC. CT abdomen and stool occult ordered 9.  Hypothyroidism.  Continue Synthroid  10.  Orthostasis:  D/c prn anti-hypertensives, teds ordered   11.  Peripheral neuropathy due to chemotherapy    12.  Macrocytic anemia: B12 reviewed and elevated, will add oral B complex  13.  Hx metastatic brain lesion s/p radiation therapy Oct 2023. Palliative care consulted  14.  Dysphagia like due to radiation therapy D3 diet  15. Nausea: resolved, d/c scopolamine patch  16. Pain from IV: placed order for IV removal  17. Constipation: dulcolax ordered, sorbitol ordered     LOS: 3 days A FACE TO FACE EVALUATION WAS PERFORMED  Tyjai Charbonnet P Donevan Biller 08/26/2023, 1:30 PM

## 2023-08-26 NOTE — Progress Notes (Signed)
Palliative:  Chart reviewed. Went to see patient, received report from RN. Per review, patient typically decisional. During my interaction with her she was incredibly drowsy - would attempt conversation but quickly fall asleep mid conversation.  I asked her about calling her daughter and she did not want me to yet.  Will try again later today to discuss GOC with her.  Gerlean Ren, DNP, AGNP-C Palliative Medicine Team Team Phone # [phone]  Pager # [phone]  NO CHARGE

## 2023-08-26 NOTE — Consult Note (Signed)
Consultation Note Date: 08/26/2023   Patient Name: Denise [name]  DOB: [birth year]  MRN: [id number]  Age / Sex: 72 y.o., female  PCP: Toma Deiters, MD Referring Physician: Horton Chin, MD  Reason for Consultation: Establishing goals of care  HPI/Patient Profile: 72 y.o. female  with past medical history of hypertension, hypothyroidism, history of metastatic breast cancer with left total mastectomy 11/20/2021 as well as metastasis to the right femur followed by oncology services Dr. Ellin Saba admitted on 08/23/2023 admitted to CIR following inpatient stay for surgical treatment of right femur lytic lesions. Underwent prophylactic intramedullary nail of right femur 08/20/2023.  Unfortunately patient has experienced a decline in CIR.  PMT consulted to discuss goals of care.  Clinical Assessment and Goals of Care: I have reviewed medical records including EPIC notes, labs and imaging, received report from RN, assessed the patient and then met with patient  to discuss diagnosis prognosis, GOC, EOL wishes, disposition and options.  RN reported patient's decline today - more lethargic.  I introduced Palliative Medicine as specialized medical care for people living with serious illness. It focuses on providing relief from the symptoms and stress of a serious illness. The goal is to improve quality of life for both the patient and the family.  Patient tells me she is "incredibly tired" but agrees to attempt conversation. She is able to tell me that she lived alone prior to admission and was independent. She starts to tell about some of her cancer treatment but then she falls asleep during conversation. I made a few attempts to continue conversation but it was clear patient was too tired to continue. I asked her if I could call her daughter to which she declined. She did confirm her daughter is her HCPOA. I asked if I could return later and she agreed.     Returned to bedside several hours later. Patient was more interactive but out conversation remained limited - could not have full goals of care conversation. This time she say is was okay for the medical team to call her daughter "if we needed to".   I hesitated to call daughter to discuss goals of care without patient's involvement as patient has remained independent and able to make her decisions independently. I am hopeful she will be able to participate in decision making soon - possible her current drowsiness is medication related?.   Discussed the above with PMT coworker who will follow up and attempt conversation when hopefully patient is more alert. Of course, if patient remains altered we will certainly reach out to daughter for decision making.    Primary Decision Maker PATIENT If patient remains unable to participate in goals of care discussions, her daughter [name] is HCPOA    SUMMARY OF RECOMMENDATIONS   Ongoing discussions needed - PMT member to follow up with patient tomorrow in hopes she is better able to participate in discussion  Code Status/Advance Care Planning: Full code - not yet addressed but hopeful to address with patient or daughter in coming days       Primary Diagnoses: Present on Admission:  Lytic bone lesion of right femur   I have reviewed the medical record, interviewed the patient and family, and examined the patient. The following aspects are pertinent.  Past Medical History:  Diagnosis Date   Asthma    as child   Cancer (HCC) 12/2020   left breast IMC   Complication of anesthesia    pateitn states,' I coded when I had  my D&Cmany years ago.   Dyspnea    Family history of breast cancer    Hypertension    Hypothyroidism    PONV (postoperative nausea and vomiting)    Port-A-Cath in place 02/26/2021   Social History   Socioeconomic History   Marital status: Widowed    Spouse name: Not on file   Number of children: 3   Years of  education: Not on file   Highest education level: Not on file  Occupational History   Occupation: [employer]   Occupation: retired  Tobacco Use   Smoking status: Never   Smokeless tobacco: Never  Vaping Use   Vaping status: Never Used  Substance and Sexual Activity   Alcohol use: Never   Drug use: Never   Sexual activity: Not Currently    Birth control/protection: Surgical  Other Topics Concern   Not on file  Social History Narrative   Not on file   Social Drivers of Health   Financial Resource Strain: Low Risk  (07/10/2022)   Overall Financial Resource Strain (CARDIA)    Difficulty of Paying Living Expenses: Not hard at all  Food Insecurity: No Food Insecurity (08/20/2023)   Hunger Vital Sign    Worried About Running Out of Food in the Last Year: Never true    Ran Out of Food in the Last Year: Never true  Transportation Needs: No Transportation Needs (08/20/2023)   PRAPARE - Administrator, Civil Service (Medical): No    Lack of Transportation (Non-Medical): No  Physical Activity: Insufficiently Active (01/22/2021)   Exercise Vital Sign    Days of Exercise per Week: 3 days    Minutes of Exercise per Session: 30 min  Stress: No Stress Concern Present (01/22/2021)   [employer] of Occupational Health - Occupational Stress Questionnaire    Feeling of Stress : Not at all  Social Connections: Moderately Integrated (01/22/2021)   Social Connection and Isolation Panel [NHANES]    Frequency of Communication with Friends and Family: More than three times a week    Frequency of Social Gatherings with Friends and Family: More than three times a week    Attends Religious Services: More than 4 times per year    Active Member of [employer] or Organizations: No    Attends Engineer, structural: More than 4 times per year    Marital Status: Widowed   Family History  Problem Relation Age of Onset   Breast cancer Mother        dx in her 104s   Heart  disease Mother    Thyroid disease Mother    Lung cancer Father        dx in his 60s   Thyroid disease Maternal Aunt    Thyroid nodules Maternal Grandmother 35       goiter   Thyroid disease Maternal Grandmother    Heart disease Maternal Grandfather    Heart disease Paternal Grandmother    Heart disease Paternal Grandfather    Thyroid disease Daughter    Thyroid disease Daughter    Cancer Maternal Uncle        NOS   Cancer Paternal Uncle        NOS   Breast cancer Cousin        pat first cousin died in her 64s;    Scheduled Meds:  apixaban  10 mg Oral BID   Followed by   Melene Muller ON 09/01/2023] apixaban  5 mg Oral  BID   B-complex with vitamin C  1 tablet Oral Daily   Chlorhexidine Gluconate Cloth  6 each Topical Q12H   cholecalciferol  1,000 Units Oral Daily   dexamethasone  4 mg Oral BID WC   diclofenac Sodium  4 g Topical QID   docusate sodium  100 mg Oral BID   dronabinol  2.5 mg Oral QAC lunch   levothyroxine  75 mcg Oral QAC breakfast   pantoprazole  40 mg Oral BID AC   sertraline  25 mg Oral QHS   sodium chloride flush  10-40 mL Intracatheter Q12H   Continuous Infusions: PRN Meds:.acetaminophen, hydrALAZINE, methocarbamol, ondansetron **OR** ondansetron (ZOFRAN) IV, oxyCODONE, polyethylene glycol, sodium chloride flush, sorbitol Allergies  Allergen Reactions   Exforge [Amlodipine Besylate-Valsartan] Nausea And Vomiting   Gadolinium Derivatives Anaphylaxis, Hives and Itching    Pt should not have MRI contrast in outpt facility. Pt developed hives and itching AFTER taking 13 hr prep. Per Dr. Charise Killian. 04/23/23   Gadopiclenol Hives and Itching    13 hr prep prior to contrast admin    Tape Other (See Comments)    Redness and Irritation   Cheese     Hard cheese   Levofloxacin In D5w Hives   Other    Proanthocyanidin Other (See Comments)   Strawberry Extract Diarrhea    Seeds,nuts, lettuce, grapes   Wild Lettuce Extract (Lactuca Virosa)    Yeast-Derived Drug Products  Hives    Mold on bread   Review of Systems  Constitutional:  Positive for fatigue.  Neurological:  Positive for weakness.    Physical Exam Constitutional:      General: She is not in acute distress.    Appearance: She is ill-appearing.     Comments: somnolent  Pulmonary:     Effort: Pulmonary effort is normal.  Skin:    General: Skin is warm and dry.     Vital Signs: BP 137/80 (BP Location: Right Arm)   Pulse 82   Temp 100.1 F (37.8 C) (Oral)   Resp 18   SpO2 97%  Pain Scale: Faces   Pain Score: 0-No pain   SpO2: SpO2: 97 % O2 Device:SpO2: 97 % O2 Flow Rate: .   IO: Intake/output summary:  Intake/Output Summary (Last 24 hours) at 08/26/2023 1624 Last data filed at 08/26/2023 1300 Gross per 24 hour  Intake 526 ml  Output 1500 ml  Net -974 ml    LBM: Last BM Date : 08/22/23 Baseline Weight:   Most recent weight:       Palliative Assessment/Data: PPS 30%     *Please note that this is a verbal dictation therefore any spelling or grammatical errors are due to the "[employer]agon [employer] One" system interpretation.   Time Total: 50 minutes Time spent includes: Detailed review of medical records (labs, imaging, vital signs), medically appropriate exam, discussion with treatment team, counseling and educating patient, family and/or staff, documenting clinical information, medication management and coordination of care.    Gerlean Ren, DNP, AGNP-C Palliative Medicine Team [phone] Pager: [phone]

## 2023-08-26 NOTE — Plan of Care (Signed)
  Problem: RH Swallowing Goal: LTG Patient will participate in dysphagia therapy to increase swallow function with assistance (SLP) Description: LTG:  Patient will participate in dysphagia therapy to increase swallow function with assistance (SLP) Flowsheets (Taken 08/26/2023 1355) LTG: Pt will participate in dysphagia therapy to increase swallow function with assistance of (SLP): Supervision

## 2023-08-26 NOTE — Patient Care Conference (Signed)
Inpatient RehabilitationTeam Conference and Plan of Care Update Date: 08/25/2023   Time: 11:33 AM    Patient Name: Denise [name]      Medical Record Number: [id number]  Date of Birth: August 26, 1951 Sex: Female         Room/Bed: 4W01C/4W01C-01 Payor Info: Payor: MEDICARE / Plan: MEDICARE PART A AND B / Product Type: *No Product type* /    Admit Date/Time:  08/23/2023  4:41 PM  Primary Diagnosis:  Lytic bone lesion of right femur  Hospital Problems: Principal Problem:   Lytic bone lesion of right femur    Expected Discharge Date: Expected Discharge Date: 08/31/23  Team Members Present: Physician leading conference: Dr. Sula Soda Social Worker Present: Lavera Guise, BSW Nurse Present: Chana Bode, RN PT Present: Blima Rich, PT OT Present: Candee Furbish, OT SLP Present: Jeannie Done, SLP PPS Coordinator present : Fae Pippin, SLP     Current Status/Progress Goal Weekly Team Focus  Bowel/Bladder   Continent of B/B. LBM 08/19/2023 Per Chart.   Maintain continence, Encourage fluid intake.   Assist with BR needs    Swallow/Nutrition/ Hydration               ADL's   Supervision UB, Min A LB, Min A toileting   Mod I, supervision showers   Barriers- high pain level, dizziness, functional endurance deficits, dynamic balance, limited assist at DC, no ADL DME    Mobility   bed mobility supervision, sit<>stands with RW mod A, stand<>pivot with RW min A   Mod I  barriers: pain, lightheadedness, nausea, decreased standing balance/coordination, global weakness/deconditioning, lives alone    Communication                Safety/Cognition/ Behavioral Observations               Pain   Complains of Generalized pain   Keep <3/10. Intervene as needed.Assess pain q4 and prn.        Skin   Grossly Intact. ( Surgical incision to Right femur)   Maintain integrity, prevent infection  Assess skin QS and prn      Discharge Planning:  Patient plans to  d/c home at MOD I level. Pt reports daughter able to provide 24/7 supervision, if needed. 1 level, level entry. Has: RW, rollator, shower seat and SPC.   Team Discussion: Patient post lytic bone lesion of right femur; fracture. Limited by constipation, poor appetite, nausea, dizziness and headaches.   Patient on target to meet rehab goals: Currently needs supervision for upper body care and min assist for lower body care and toileting. Needs min assist for sit - stand and stand pivot transfers. Needs min assist for ambulation.  Goals for discharge set for supervision - Mod I overall.  *See Care Plan and progress notes for long and short-term goals.   Revisions to Treatment Plan:  SLP evaluation MD discontinued BP meds Marinol added for appetite Scop patch for nausea Consult to palliative care   Teaching Needs: Safety, medications, dietary modifications, transfers, toileting, etc.   Current Barriers to Discharge: Decreased caregiver support and Home enviroment access/layout  Possible Resolutions to Barriers: Family education     Medical Summary Current Status: constipation, nausea, lack of appetite, overweight, dizziness  Barriers to Discharge: Medical stability  Barriers to Discharge Comments: constipation, nasuea, lack of appetite, overweight, dizziness Possible Resolutions to [employer] Focus: dulcolax ordered one time, colace BID ordered, scopolamine patch ordered, marinol ordered, palliative care eval ordered, SLP ordered, discontinued hypertensive  medications   Continued Need for Acute Rehabilitation Level of Care: The patient requires daily medical management by a physician with specialized training in physical medicine and rehabilitation for the following reasons: Direction of a multidisciplinary physical rehabilitation program to maximize functional independence : Yes Medical management of patient stability for increased activity during participation in an intensive  rehabilitation regime.: Yes Analysis of laboratory values and/or radiology reports with any subsequent need for medication adjustment and/or medical intervention. : Yes   I attest that I was present, lead the team conference, and concur with the assessment and plan of the team.   Chana Bode B 08/26/2023, 1:05 PM

## 2023-08-26 NOTE — Progress Notes (Signed)
Patient ID: Denise [name], female   DOB: May 30, 1951, 72 y.o.   MRN: [id number] Met with the patient briefly to review current situation, rehab schedule, team conference and plan of care. Patient limited by fatigue, constipation with nausea. Poor appetite and reported chest tightness "not heart attack pain". Patient wants to do and tries to participate, inhibited by poor reserve  not feeling well and slow to move. Reported "struggling at home" PTA; doesn't want to burden children. Goal is to return home. Reviewed options for management of issues; MD aware and continued discussion. Continue to follow along to address educational needs to facilitate preparation for discharge. Pamelia Hoit

## 2023-08-26 NOTE — Telephone Encounter (Signed)
Patient Product/process development scientist completed.    The patient is insured through [employer]. Patient has Medicare and is not eligible for a copay card, but may be able to apply for patient assistance, if available.    Ran test claim for Eliquis Starter Pack and the current 30 day co-pay is $267.14 due to a deductilbe.   This test claim was processed through [facility]- copay amounts may vary at other pharmacies due to pharmacy/plan contracts, or as the patient moves through the different stages of their insurance plan.     Roland Earl, CPHT Pharmacy Technician III Certified Patient Advocate [facility] Patient Advocate Team Direct Number: [phone]  Fax: [phone]

## 2023-08-26 NOTE — Progress Notes (Signed)
Patient ID: Denise [name], female   DOB: [birth year], 72 y.o.   MRN: [id number]   Family education arranged with daughter, Saturday 1-4 PM.

## 2023-08-26 NOTE — Progress Notes (Signed)
Patient ID: Denise [name], female   DOB: [birth year], 72 y.o.   MRN: [id number]  No DME or follow up recommendations currently.

## 2023-08-26 NOTE — Evaluation (Signed)
Speech Language Pathology Assessment and Plan  Patient Details  Name: Denise [name] MRN: [id number] Date of Birth: [birth year]  SLP Diagnosis: Dysphagia;Cognitive Impairments  Rehab Potential: Fair ELOS: 12/17   Today's Date: 08/26/2023 SLP Individual Time: 4259-5638 SLP Individual Time Calculation (min): 21 min  Hospital Problem: Principal Problem:   Lytic bone lesion of right femur  Past Medical History:  Past Medical History:  Diagnosis Date   Asthma    as child   Cancer (HCC) 12/2020   left breast IMC   Complication of anesthesia    pateitn states,' I coded when I had my D&Cmany years ago.   Dyspnea    Family history of breast cancer    Hypertension    Hypothyroidism    PONV (postoperative nausea and vomiting)    Port-A-Cath in place 02/26/2021   Past Surgical History:  Past Surgical History:  Procedure Laterality Date   ABDOMINAL HYSTERECTOMY     BIOPSY  01/17/2018   Procedure: BIOPSY;  Surgeon: Malissa Hippo, MD;  [facility];  Service: Endoscopy;;  duodenum,gastric   CHOLECYSTECTOMY     COLONOSCOPY WITH PROPOFOL N/A 01/17/2018   Procedure: COLONOSCOPY WITH PROPOFOL;  Surgeon: Malissa Hippo, MD;  [facility];  Service: Endoscopy;  Laterality: N/A;  7:30   DILATION AND CURETTAGE OF UTERUS     ESOPHAGOGASTRODUODENOSCOPY (EGD) WITH PROPOFOL N/A 01/17/2018   Procedure: ESOPHAGOGASTRODUODENOSCOPY (EGD) WITH PROPOFOL;  Surgeon: Malissa Hippo, MD;  [facility];  Service: Endoscopy;  Laterality: N/A;   POLYPECTOMY  01/17/2018   Procedure: POLYPECTOMY;  Surgeon: Malissa Hippo, MD;  [facility];  Service: Endoscopy;;  transverse colon, cecal   PORTACATH PLACEMENT Right 02/17/2021   Procedure: INSERTION PORT-A-CATH;  Surgeon: Abigail Miyamoto, MD;  Location: [facility];  Service: General;  Laterality: Right;   RADIOACTIVE SEED GUIDED AXILLARY SENTINEL LYMPH NODE Left 11/20/2021   Procedure: RADIOACTIVE SEED  GUIDED LEFT AXILLARY SENTINEL LYMPH NODE DISSECTION;  Surgeon: Abigail Miyamoto, MD;  Location: [facility];  Service: General;  Laterality: Left;   TOTAL MASTECTOMY Left 11/20/2021   Procedure: LEFT TOTAL MASTECTOMY;  Surgeon: Abigail Miyamoto, MD;  Location: [facility];  Service: General;  Laterality: Left;    Assessment / Plan / Recommendation Clinical Impression Pt. Is a 72 year old female with a past medical history of metastatic breast cancer. She was found to have lytic lesions of the right femur and presented to Ortho service at [facility] on 08/20/23 for prophylactic intramedullary nail of right femur. She was noted to have some ABL anemia and leukocytosis post operatively. She was seen by PT/OT and they recommend CIR to assist return to PLOF.   SLP met with patient for bedside swallow evaluation. Throughout session, patient lethargic, requiring mod cues to sustain attention to task. Spoke with PT who confirmed patient is not at baseline level of function this date compared to her previous sessions at CIR, thus findings from this swallow evaluation are likely not representative of patient's overall swallowing function. Patient exhibited significant difficulty answering SLP questions, thus patient interview was limited. Patient does indicate that eating/swallowing is difficult and during discussion in team conference, OT indicated patient has difficulty consuming meals. During oral mechanism exam, patient demonstrated generalized oral weakness and reduced lingual range of motion. Patient tolerated sips of thin liquid from straw x2 with no overt s/sx of penetration/aspiration, but given patient's overall lethargy, did not trial  any further solids. Patient has been recommended for hospice/palliative caseload, thus will defer diet recommendation to hospice team as they discuss with patient her goals for quality of life. Regardless of diet recommendation,  patient would benefit from remaining on SLP caseload for SLP to assist patient with strategies for bolus management to benefit energy conservation and quality of life. Patient was left in bed with call bell in reach and bed alarm set.    Skilled Therapeutic Interventions          SLP conducted skilled evaluation to assess oropharyngeal swallowing function clinically. Utilized bedside swallow evaluation. Full results above.    SLP Assessment  Patient will need skilled Speech Lanaguage Pathology Services during CIR admission    Recommendations  SLP Diet Recommendations: Other (Comment) (SLP will defer diet recommendations to hospice/palliative care team and recommend diet that best aligns with patients wishes for quality of life.) Liquid Administration via: Cup;Straw Medication Administration: Whole meds with liquid Supervision: Patient able to self feed Postural Changes and/or Swallow Maneuvers: Seated upright 90 degrees Oral Care Recommendations: Oral care BID Patient destination: Home Follow up Recommendations: None Equipment Recommended: None recommended by SLP    SLP Frequency 1 to 3 out of 7 days   SLP Duration  SLP Intensity  SLP Treatment/Interventions 12/17  Minumum of 1-2 x/day, 30 to 90 minutes  Dysphagia/aspiration precaution training;Patient/family education    Pain Pain Assessment Pain Scale: Faces Faces Pain Scale: Hurts whole lot Pain Location: Head  Prior Functioning Cognitive/Linguistic Baseline: Within functional limits Type of Home: House  Lives With: Alone Available Help at Discharge: Family;Available PRN/intermittently Vocation: Part time employment  Care Tool Care Tool Cognition Ability to hear (with hearing aid or hearing appliances if normally used Ability to hear (with hearing aid or hearing appliances if normally used): 1. Minimal difficulty - difficulty in some environments (e.g. when person speaks softly or setting is noisy)   Expression of  Ideas and Wants Expression of Ideas and Wants: 1. Rarely/Never expressess or very difficult - rarely/never expresses self or speech is very difficult to understand   Understanding Verbal and Non-Verbal Content Understanding Verbal and Non-Verbal Content: 4. Understands (complex and basic) - clear comprehension without cues or repetitions  Memory/Recall Ability Memory/Recall Ability : Current season;That he or she is in a hospital/hospital unit   Bedside Swallowing Assessment General Date of Onset: 08/26/23 Previous Swallow Assessment: n/a Diet Prior to this Study: Regular;Thin liquids (Level 0) Temperature Spikes Noted: No Respiratory Status: Room air History of Recent Intubation: No Behavior/Cognition: Lethargic/Drowsy;Distractible;Requires cueing;Cooperative Oral Cavity - Dentition: Poor condition;Missing dentition Self-Feeding Abilities: Needs assist Patient Positioning: Upright in bed Baseline Vocal Quality: Low vocal intensity Volitional Cough: Weak Volitional Swallow: Able to elicit  Oral Care Assessment Oral Assessment  (WDL): Exceptions to WDL Lips: Symmetrical Teeth: Missing (Comment) Tongue: Pink;Moist Mucous Membrane(s): Pink;Moist Saliva: Moist, saliva free flowing Level of Consciousness: Alert Is patient on any of following O2 devices?: None of the above Nutritional status: No high risk factors Oral Assessment Risk : Low Risk Ice Chips Ice chips: Not tested Thin Liquid Thin Liquid: Within functional limits Nectar Thick Nectar Thick Liquid: Not tested Honey Thick Honey Thick Liquid: Not tested Puree Puree: Not tested Solid Solid: Not tested BSE Assessment Risk for Aspiration Impact on safety and function: Risk for inadequate nutrition/hydration (unable to determine aspiration risk based on boluses observed during evaluation) Other Related Risk Factors: Cognitive impairment;Lethargy;Deconditioning  Short Term Goals: Week 1: SLP Short Term Goal 1 (Week 1):  STGs=LTGs d/t ELOS  Refer to Care Plan for Long Term Goals  Recommendations for other services: None   Discharge Criteria: Patient will be discharged from SLP if patient refuses treatment 3 consecutive times without medical reason, if treatment goals not met, if there is a change in medical status, if patient makes no progress towards goals or if patient is discharged from hospital.  The above assessment, treatment plan, treatment alternatives and goals were discussed and mutually agreed upon: by patient  Jeannie Done, M.A., CCC-SLP  Yetta Barre 08/26/2023, 12:49 PM

## 2023-08-26 NOTE — IPOC Note (Signed)
Overall Plan of Care [facility]) Patient Details Name: Denise [name] MRN: [id number] DOB: 23-Aug-1951  Admitting Diagnosis: Lytic bone lesion of right femur  Hospital Problems: Principal Problem:   Lytic bone lesion of right femur     Functional Problem List: Nursing Bowel, Safety, Endurance, Medication Management, Pain  PT Balance, Edema, Endurance, Nutrition, Pain, Sensory, Skin Integrity  OT Balance, Edema, Endurance, Motor, Pain, Vision, Nutrition  SLP Nutrition  TR         Basic ADL's: OT Bathing, Dressing, Toileting     Advanced  ADL's: OT Simple Meal Preparation     Transfers: PT Bed Mobility, Bed to Chair, Car, Occupational psychologist, Research scientist (life sciences): PT Ambulation, Psychologist, prison and probation services, Stairs     Additional Impairments: OT None  SLP Swallowing      TR      Anticipated Outcomes Item Anticipated Outcome  Self Feeding Independent  Swallowing  supervision   Basic self-care  Supervision to mod I  Toileting  Mod I   Bathroom Transfers Mod I  Bowel/Bladder  manage w mod I assist  Transfers  Mod I with LRAD  Locomotion  Mod I with LRAD  Communication     Cognition     Pain  < 4 with prns  Safety/Judgment  manage w cues   Therapy Plan: PT Intensity: Minimum of 1-2 x/day ,45 to 90 minutes PT Frequency: 5 out of 7 days PT Duration Estimated Length of Stay: 12 days OT Intensity: Minimum of 1-2 x/day, 45 to 90 minutes OT Frequency: 5 out of 7 days OT Duration/Estimated Length of Stay: 10-12 days SLP Intensity: Minumum of 1-2 x/day, 30 to 90 minutes SLP Frequency: 1 to 3 out of 7 days SLP Duration/Estimated Length of Stay: 12/17   Team Interventions: Nursing Interventions Bowel Management, Medication Management, Pain Management, Patient/Family Education, Disease Management/Prevention, Discharge Planning  PT interventions Ambulation/gait training, Discharge planning, Functional mobility training, Therapeutic Activities, Balance/vestibular  training, Disease management/prevention, Neuromuscular re-education, Skin care/wound management, Therapeutic Exercise, Wheelchair propulsion/positioning, DME/adaptive equipment instruction, Pain management, UE/LE Strength taining/ROM, Firefighter, Equities trader education, Museum/gallery curator, UE/LE Coordination activities  OT Interventions Warden/ranger, Discharge planning, Pain management, Self Care/advanced ADL retraining, Therapeutic Activities, UE/LE Coordination activities, Disease mangement/prevention, Functional mobility training, Patient/family education, Skin care/wound managment, Therapeutic Exercise, Visual/perceptual remediation/compensation, DME/adaptive equipment instruction, Neuromuscular re-education, Psychosocial support, UE/LE Strength taining/ROM, Wheelchair propulsion/positioning  SLP Interventions Dysphagia/aspiration precaution training, Patient/family education  TR Interventions    SW/CM Interventions Discharge Planning, Psychosocial Support, Patient/Family Education, Disease Management/Prevention   Barriers to Discharge MD  Medical stability  Nursing Decreased caregiver support 1 level/level entry solo; Pt. wants to return home independenly but daughter can stay with her 24/7 if needed  PT Decreased caregiver support, Other (comments) pain, lightheadedness, nausea, lives alone  OT Pending chemo/radiation, Home environment access/layout, Decreased caregiver support, Nutrition means    SLP Nutrition means Overall medical prognosis  SW Lack of/limited family support, Decreased caregiver support     Team Discharge Planning: Destination: PT-Home ,OT- Home , SLP-Home Projected Follow-up: PT-Home health PT, OT-  Home health OT, SLP-None Projected Equipment Needs: PT-To be determined, OT- 3 in 1 bedside comode, To be determined, SLP-None recommended by SLP Equipment Details: PT-has RW, SPC, and rollator, OT-  Patient/family involved in discharge planning:  PT- Patient,  OT-Patient, SLP-Patient  MD ELOS: 10-12 days Medical Rehab Prognosis:  Excellent Assessment: The patient has been admitted for CIR therapies with the diagnosis of right femur lytic lesions s/p  IM nail. The team will be addressing functional mobility, strength, stamina, balance, safety, adaptive techniques and equipment, self-care, bowel and bladder mgt, patient and caregiver education. Goals have been set at Anticipated discharge destination is supervision.   See Team Conference Notes for weekly updates to the plan of care

## 2023-08-26 NOTE — Progress Notes (Signed)
Physical Therapy Session Note  Patient Details  Name: Denise [name] MRN: [id number] Date of Birth: 09/02/1951  Today's Date: 08/26/2023 PT Individual Time: 4098-1191 PT Individual Time Calculation (min): 38 min Today's Date: 08/26/2023 PT Missed Time: 37 Minutes Missed Time Reason: MD hold (Comment)  Short Term Goals: Week 1:  PT Short Term Goal 1 (Week 1): pt will transfer sit<>stand with LRAD and CGA PT Short Term Goal 2 (Week 1): pt will transfer bed<>chair with LRAD and CGA PT Short Term Goal 3 (Week 1): pt will ambulate 92ft with LRAD and CGA  Skilled Therapeutic Interventions/Progress Updates:   Consulted with RN and PA to determine if pt is cleared for therapy due to DVT in RLE found on ultrasound yesterday. PA requesting to hold therapy until cleared by MD - MD did clear pt for therapy later on. Received pt semi-reclined in bed, pt agreeable to PT treatment, and denied any pain. Of note, pt with increased difficulty speaking and communicating this morning.   Pt extremely slow to move this morning with very minimal initiation. More than adequate time spent attempting to answer questions for nursing and pt unable to give clear concise answers. Donned teds in supine with total A. Pt transferred semi-reclined<>sitting R EOB with HOB elevated and use of bedrails with heavy mod A and max cues for sequencing (usually able to perform with supervision). Pt unable to scoot to EOB, requiring max A to reach EOB. Pt also with new posterior and R lateral lean requiring up to mod A to correct and cues to reach for footboard to regain balance. Took meds sitting EOB, but pt moaning and leaning more and more to R side. Deferred any standing/OOB mobility due to new and worsening presentation compared to previous days. Transitioned into supine with max A and required +2 to scoot to [facility]. MD present for morning rounds and updated on pt's current status - plan for abdominal and head CT today. Concluded  session with pt semi-reclined in bed, needs within reach, and bed alarm on. 37 minutes missed of skilled physical therapy due to MD hold, then safety concerns limiting continued therapy.   Therapy Documentation Precautions:  Precautions Precautions: Fall Precaution Comments: active cancer wtih chemo port, monitor BP Restrictions Weight Bearing Restrictions: No RLE Weight Bearing: Weight bearing as tolerated  Therapy/Group: Individual Therapy [name] PT, DPT 08/26/2023, 6:57 AM

## 2023-08-26 NOTE — Progress Notes (Signed)
Occupational Therapy Session Note  Patient Details  Name: Denise [name] MRN: [id number] Date of Birth: 1951-08-26  Today's Date: 08/26/2023 OT Individual Time: 1300-1315 OT Individual Time Calculation (min): 15 min  and Today's Date: 08/26/2023 OT Missed Time: 45 Minutes Missed Time Reason: Patient fatigue (lethargy with cognitive change)   Short Term Goals: Week 1:  OT Short Term Goal 1 (Week 1): Pt will complete sit > stand in prep for ADL with CGA using LRAD OT Short Term Goal 2 (Week 1): Pt will thread LB clothing with AE PRN, with supervision OT Short Term Goal 3 (Week 1): Pt will complete toilet transfer with CGA using LRAD OT Short Term Goal 4 (Week 1): Pt will improve endurance to sitting EOB/sink for at least 10 mins to promote ability to complete bathing at shower level  Skilled Therapeutic Interventions/Progress Updates:    Pt in bed upon arrival. Attempted to engage pt in conversation and introduce myself. Pt unable to complete sentences or answer questions. Pt not oriented to date or place. Attempted to engage pt in bed mobility tasks but pt unable to follow commands. Pt remained in bed. All needs within reach. Pt missed 45 mins skilled OT services. Pt schedule for chest xray but will attempt to see again as time allows.  Therapy Documentation Precautions:  Precautions Precautions: Fall Precaution Comments: active cancer wtih chemo port, monitor BP Restrictions Weight Bearing Restrictions Per Provider Order: No RLE Weight Bearing Per Provider Order: Weight bearing as tolerated General: General OT Amount of Missed Time: 45 Minutes Pain: Pt with no s/s of pain at rest   Therapy/Group: Individual Therapy  Rich Brave 08/26/2023, 2:40 PM

## 2023-08-27 ENCOUNTER — Other Ambulatory Visit (HOSPITAL_COMMUNITY): Payer: Self-pay

## 2023-08-27 ENCOUNTER — Encounter (HOSPITAL_COMMUNITY): Payer: Self-pay | Admitting: Orthopaedic Surgery

## 2023-08-27 DIAGNOSIS — R627 Adult failure to thrive: Secondary | ICD-10-CM

## 2023-08-27 DIAGNOSIS — R5381 Other malaise: Secondary | ICD-10-CM | POA: Diagnosis not present

## 2023-08-27 DIAGNOSIS — M899 Disorder of bone, unspecified: Secondary | ICD-10-CM | POA: Diagnosis not present

## 2023-08-27 DIAGNOSIS — Z7189 Other specified counseling: Secondary | ICD-10-CM | POA: Diagnosis not present

## 2023-08-27 DIAGNOSIS — F54 Psychological and behavioral factors associated with disorders or diseases classified elsewhere: Secondary | ICD-10-CM | POA: Diagnosis not present

## 2023-08-27 DIAGNOSIS — Z515 Encounter for palliative care: Secondary | ICD-10-CM

## 2023-08-27 LAB — BASIC METABOLIC PANEL
Anion gap: 8 (ref 5–15)
BUN: 21 mg/dL (ref 8–23)
CO2: 20 mmol/L — ABNORMAL LOW (ref 22–32)
Calcium: 7.7 mg/dL — ABNORMAL LOW (ref 8.9–10.3)
Chloride: 103 mmol/L (ref 98–111)
Creatinine, Ser: 0.97 mg/dL (ref 0.44–1.00)
GFR, Estimated: >60 mL/min (ref >60)
Glucose, Bld: 94 mg/dL (ref 70–99)
Potassium: 4.2 mmol/L (ref 3.5–5.1)
Sodium: 131 mmol/L — ABNORMAL LOW (ref 135–145)

## 2023-08-27 NOTE — Progress Notes (Signed)
Palliative Medicine Progress Note   Patient Name: Denise [name]       Date: 08/27/2023 DOB: 12/13/1950  Age: 72 y.o. MRN#: [id number] Attending Physician: Horton Chin, MD Primary Care Physician: Toma Deiters, MD Admit Date: 08/23/2023  Reason for Consultation/Follow-up: {Reason for Consult:23484}  HPI/Patient Profile: 72 y.o. female  with past medical history of hypertension, hypothyroidism, history of metastatic breast cancer with left total mastectomy 11/20/2021 as well as metastasis to the right femur followed by oncology services Dr. Ellin Saba admitted on 08/23/2023 admitted to [facility] following inpatient stay for surgical treatment of right femur lytic lesions. Underwent prophylactic intramedullary nail of right femur 08/20/2023.  Unfortunately patient has experienced a decline in CIR.  PMT consulted to discuss goals of care.   Subjective: ***  Objective:  Physical Exam          Vital Signs: BP 97/61 (BP Location: Right Arm)   Pulse 89   Temp 98.6 F (37 C)   Resp 18   SpO2 97%  SpO2: SpO2: 97 % O2 Device: O2 Device: Room Air O2 Flow Rate:    Intake/output summary:  Intake/Output Summary (Last 24 hours) at 08/27/2023 1819 Last data filed at 08/27/2023 1400 Gross per 24 hour  Intake 100 ml  Output 950 ml  Net -850 ml    LBM: Last BM Date : 08/22/23 (received in report)     Palliative Assessment/Data: ***     Palliative Medicine Assessment & Plan   Assessment: Principal Problem:   Lytic bone lesion of right femur    Recommendations/Plan: Code status changed to DNR/DNI Patient is clear in her goal to return home and maximize quality of life in the setting of terminal cancer and progressive decline Patient is agreeable to home with hospice - referral to  be made to [facility]  Goals of Care and Additional Recommendations: Limitations on Scope of Treatment: {Recommended Scope and Preferences:21019}   Prognosis:  < 6 months  Discharge Planning: {Palliative dispostion:23505}  Care plan was discussed with ***  Thank you for allowing the Palliative Medicine Team to assist in the care of this patient.   ***   Merry Proud, NP   Please contact Palliative Medicine Team phone at [phone] for questions and concerns.  For individual providers, please see AMION.

## 2023-08-27 NOTE — Progress Notes (Signed)
Physical Therapy Note  Patient Details  Name: Denise [name] MRN: [id number] Date of Birth: 01-02-1951 Today's Date: 08/27/2023   Pt's plan of care adjusted to QD after speaking with care team and discussed with MD in team conference as pt currently unable to tolerate current therapy schedule with OT, PT, and SLP.     Marlana Salvage Denise [name] Denise [name] PT, DPT 08/27/2023, 11:39 AM

## 2023-08-27 NOTE — Progress Notes (Incomplete)
Occupational Therapy Session Note  Patient Details  Name: Denise [name] MRN: [id number] Date of Birth: December 22, 1950  {CHL IP REHAB OT TIME CALCULATIONS:304400400}   Short Term Goals: Week 1:  OT Short Term Goal 1 (Week 1): Pt will complete sit > stand in prep for ADL with CGA using LRAD OT Short Term Goal 2 (Week 1): Pt will thread LB clothing with AE PRN, with supervision OT Short Term Goal 3 (Week 1): Pt will complete toilet transfer with CGA using LRAD OT Short Term Goal 4 (Week 1): Pt will improve endurance to sitting EOB/sink for at least 10 mins to promote ability to complete bathing at shower level  Skilled Therapeutic Interventions/Progress Updates:  Pt greeted *** for skilled OT session with focus on ***.   Pain: Pt reported ***/10 pain, stating "***" in reference to ***. OT offering intermediate rest breaks and positioning suggestions throughout session to address pain/fatigue and maximize participation/safety in session.   Functional Transfers:  Self Care Tasks:  Therapeutic Activities:  Therapeutic Exercise:   Education:  Pt remained *** with 4Ps assessed and immediate needs met. Pt continues to be appropriate for skilled OT intervention to promote further functional independence in ADLs/IADLs.    Therapy Documentation Precautions:  Precautions Precautions: Fall Precaution Comments: active cancer wtih chemo port, monitor BP Restrictions Weight Bearing Restrictions Per Provider Order: No RLE Weight Bearing Per Provider Order: Weight bearing as tolerated   Therapy/Group: Individual Therapy  Lou Cal, OTR/L, MSOT  08/27/2023, 9:49 PM

## 2023-08-27 NOTE — Progress Notes (Signed)
PROGRESS NOTE   Subjective/Complaints: Much more alert this morning Dehydrated, will check BMP today Decreased therapy frequency to every day No issues overnight  ROS: +lightheadedness, +nausea, +somnolence, +constipation   Objective:   DG Chest 2 View Result Date: 08/26/2023 CLINICAL DATA:  Short of breath EXAM: CHEST - 2 VIEW COMPARISON:  07/17/2023 FINDINGS: Frontal and lateral views of the chest demonstrates stable right chest wall port. Cardiac silhouette is stable, with continued ectasia of the thoracic aorta. No acute airspace disease, effusion, or pneumothorax. No acute bony abnormality. IMPRESSION: 1. Stable chest, no acute process. Electronically Signed   By: Sharlet Salina M.D.   On: 08/26/2023 18:04   CT ABDOMEN PELVIS WO CONTRAST Result Date: 08/26/2023 CLINICAL DATA:  Abdominal pain, acute, nonlocalized lower GI bleed. EXAM: CT ABDOMEN AND PELVIS WITHOUT CONTRAST TECHNIQUE: Multidetector CT imaging of the abdomen and pelvis was performed following the standard protocol without IV contrast. RADIATION DOSE REDUCTION: This exam was performed according to the departmental dose-optimization program which includes automated exposure control, adjustment of the mA and/or kV according to patient size and/or use of iterative reconstruction technique. COMPARISON:  Abdominopelvic CT with contrast 06/08/2023 FINDINGS: Lower chest: Mild motion artifact on images through the lung bases and upper chest. No change in mild linear atelectasis or scarring at both lung bases. No change in clustered nodularity posteromedially in the left lower lobe. Central venous catheter terminates at the superior cavoatrial junction. There is aortic and coronary atherosclerosis. Hepatobiliary: The liver appears unremarkable as imaged in the noncontrast state. Status post cholecystectomy without evidence of biliary dilatation. Pancreas: Unremarkable. No  pancreatic ductal dilatation or surrounding inflammatory changes. Spleen: Normal in size without focal abnormality. Adrenals/Urinary Tract: Both adrenal glands appear normal. No evidence of urinary tract calculus, suspicious renal lesion or hydronephrosis. The bladder appears unremarkable for its degree of distention. Stomach/Bowel: No enteric contrast administered. The stomach appears unremarkable for its degree of distension. No evidence of bowel wall thickening, distention or surrounding inflammatory change. There is a moderate amount of high density stool throughout the colon. Sigmoid diverticular changes are present. The appendix is not clearly visualized. Vascular/Lymphatic: There are no enlarged abdominal or pelvic lymph nodes. Mild aortoiliac atherosclerosis without evidence of aneurysm. Reproductive: Status post hysterectomy.  No adnexal mass. Other: No evidence of abdominal wall mass or hernia. No ascites or pneumoperitoneum. Musculoskeletal: Grossly stable known osseous metastatic disease within the T8 and T11 vertebral bodies, upper sacrum, both iliac crests and the left ischium. Patient has undergone interval proximal right femoral intramedullary nail and dynamic screw fixation. There is edema, soft tissue emphysema and hematomas within the subcutaneous fat lateral to the right buttocks, the latter measuring up to 4.7 x 2.0 cm on image 65/3. IMPRESSION: 1. No acute findings or explanation for the patient's symptoms. No evidence of gastrointestinal bleeding on noncontrast imaging. High density stool throughout the colon with mild distal colonic diverticulosis. 2. Interval proximal right femoral intramedullary nail and dynamic screw fixation with edema, soft tissue emphysema and hematomas within the subcutaneous fat lateral to the right buttocks. 3. Grossly stable known osseous metastatic disease. 4. Stable clustered nodularity posteromedially in the left lower lobe, likely infectious/inflammatory. 5.  Aortic Atherosclerosis (ICD10-I70.0). Electronically Signed   By: Carey Bullocks M.D.   On: 08/26/2023 14:17   CT HEAD WO CONTRAST ( ) Result Date: 08/26/2023 CLINICAL DATA:  Altered mental status, nontraumatic (Ped 0-17y) EXAM: CT HEAD WITHOUT CONTRAST TECHNIQUE: Contiguous axial images were obtained from the base of the skull through the vertex without intravenous contrast. RADIATION DOSE REDUCTION: This exam was performed according to the departmental dose-optimization program which includes automated exposure control, adjustment of the mA and/or kV according to patient size and/or use of iterative reconstruction technique. COMPARISON:  Prior bone scan dated 06/29/2023, brain MR 04/23/2023 FINDINGS: Brain: No hemorrhage. No hydrocephalus. Extra-axial fluid collection. No CT evidence of an acute cortical infarct. No mass effect. No mass lesion. There is a background of mild chronic microvascular ischemic change. Vascular: No hyperdense vessel or unexpected calcification. Skull: There are scattered lucent calvarial lesions, most notable of which is in the right occipital bone (series 4, image 20), worrisome for osseous metastatic disease. Sinuses/Orbits: Large right mastoid effusion and middle ear effusion. No left-sided mastoid or middle ear effusion. Paranasal sinuses are clear. Left lens replacement. Orbits are otherwise unremarkable. Other: None. IMPRESSION: 1. No CT finding to explain altered mental status 2. Scattered lucent calvarial lesions, most notable of which is in the right occipital bone, worrisome for osseous metastatic disease. 3. Large right mastoid and middle ear effusion, unchanged from prior brain MRI. Electronically Signed   By: Lorenza Cambridge M.D.   On: 08/26/2023 10:33   VAS [place] LOWER EXTREMITY VENOUS (DVT) Result Date: 08/25/2023  Lower Venous DVT Study Patient Name:  Denise [name] Feld  Date of Exam:   08/25/2023 Medical Rec #: [id number]          Accession #:    [id number] Date of  Birth: 07-Jul-1951          Patient Gender: F Patient Age:   72 years Exam Location:  [facility] Procedure:      VAS [place] LOWER EXTREMITY VENOUS (DVT) Referring Phys: Mariam Dollar --------------------------------------------------------------------------------  Indications: Swelling.  Risk Factors: Cancer. Comparison Study: No prior studies. Performing Technologist: Chanda Busing RVT  Examination Guidelines: A complete evaluation includes B-mode imaging, spectral Doppler, color Doppler, and power Doppler as needed of all accessible portions of each vessel. Bilateral testing is considered an integral part of a complete examination. Limited examinations for reoccurring indications may be performed as noted. The reflux portion of the exam is performed with the patient in reverse Trendelenburg.  +---------+---------------+---------+-----------+----------+--------------+ RIGHT    CompressibilityPhasicitySpontaneityPropertiesThrombus Aging +---------+---------------+---------+-----------+----------+--------------+ CFV      Partial        Yes      Yes                  Acute          +---------+---------------+---------+-----------+----------+--------------+ SFJ      Full                                                        +---------+---------------+---------+-----------+----------+--------------+ FV Prox  None           No       No                   Acute          +---------+---------------+---------+-----------+----------+--------------+ FV Mid  None           No       No                   Acute          +---------+---------------+---------+-----------+----------+--------------+ FV DistalNone           No       No                   Acute          +---------+---------------+---------+-----------+----------+--------------+ PFV      Full                                                         +---------+---------------+---------+-----------+----------+--------------+ POP      None           No       No                   Acute          +---------+---------------+---------+-----------+----------+--------------+ PTV      None                                         Acute          +---------+---------------+---------+-----------+----------+--------------+ PERO     Partial                                      Acute          +---------+---------------+---------+-----------+----------+--------------+ Gastroc  Partial                                      Acute          +---------+---------------+---------+-----------+----------+--------------+   +---------+---------------+---------+-----------+----------+--------------+ LEFT     CompressibilityPhasicitySpontaneityPropertiesThrombus Aging +---------+---------------+---------+-----------+----------+--------------+ CFV      Full           Yes      Yes                                 +---------+---------------+---------+-----------+----------+--------------+ SFJ      Full                                                        +---------+---------------+---------+-----------+----------+--------------+ FV Prox  Full                                                        +---------+---------------+---------+-----------+----------+--------------+ FV Mid   Full                                                        +---------+---------------+---------+-----------+----------+--------------+  FV DistalFull                                                        +---------+---------------+---------+-----------+----------+--------------+ PFV      Full                                                        +---------+---------------+---------+-----------+----------+--------------+ POP      Full           Yes      Yes                                  +---------+---------------+---------+-----------+----------+--------------+ PTV      Full                                                        +---------+---------------+---------+-----------+----------+--------------+ PERO     Full                                                        +---------+---------------+---------+-----------+----------+--------------+     Summary: RIGHT: - Findings consistent with acute deep vein thrombosis involving the right common femoral vein, right femoral vein, right popliteal vein, right posterior tibial veins, and right peroneal veins. Findings consistent with acute intramuscular thrombosis involving the right gastrocnemius veins. - No cystic structure found in the popliteal fossa.  LEFT: - There is no evidence of deep vein thrombosis in the lower extremity.  - No cystic structure found in the popliteal fossa.  *See table(s) above for measurements and observations. Electronically signed by Coral Else MD on 08/25/2023 at 9:09:36 PM.    Final    Recent Labs    08/25/23 0550 08/26/23 0432  WBC 5.5 6.7  HGB 8.8* 8.5*  HCT 25.9* 25.8*  PLT 130* 155   No results for input(s): "NA", "K", "CL", "CO2", "GLUCOSE", "BUN", "CREATININE", "CALCIUM" in the last 72 hours.   Intake/Output Summary (Last 24 hours) at 08/27/2023 1334 Last data filed at 08/27/2023 1230 Gross per 24 hour  Intake 50 ml  Output 950 ml  Net -900 ml        Physical Exam: Vital Signs Blood pressure 97/61, pulse 89, temperature 98.6 F (37 C), resp. rate 18, SpO2 97%. Skin:    Comments: Right hip incision dressed.  Appropriately tender  Neurological:     Comments: Patient is alert and follows commands, but more fatigued and with delayed processing today     General: No acute distress Mood and affect are appropriate Heart: Regular rate and rhythm no rubs murmurs or extra sounds, +orthostasis Lungs: Clear to auscultation, breathing unlabored, no rales or wheezes Abdomen:  Positive bowel sounds, soft nontender to palpation, nondistended Extremities: No clubbing, cyanosis, or edema Skin: No evidence of breakdown,  no evidence of rash Neurologic: mildly delayed responses with mild dysarthria motor strength is 5/5 in bilateral deltoid, bicep, tricep, grip, hip flexor, 3/5 in lower extremities Sensory exam normal sensation to light touch  in bilateral upper and lower extremities, stable 12/13   Musculoskeletal: Full range of motion in BUE No joint swelling Post op Right thigh edema with ecchymosis posterior to hip incisions Fullness and mild tenderness Right popliteal fossa     Assessment/Plan: 1. Functional deficits which require 3+ hours per day of interdisciplinary therapy in a comprehensive inpatient rehab setting. Physiatrist is providing close team supervision and 24 hour management of active medical problems listed below. Physiatrist and rehab team continue to assess barriers to discharge/monitor patient progress toward functional and medical goals  Care Tool:  Bathing    Body parts bathed by patient: Right arm, Left arm, Chest, Abdomen, Front perineal area, Buttocks, Right upper leg, Left upper leg, Face   Body parts bathed by helper: Right lower leg, Left lower leg     Bathing assist Assist Level: Minimal Assistance - Patient > 75%     Upper Body Dressing/Undressing Upper body dressing   What is the patient wearing?: Pull over shirt    Upper body assist Assist Level: Supervision/Verbal cueing    Lower Body Dressing/Undressing Lower body dressing      What is the patient wearing?: Pants     Lower body assist Assist for lower body dressing: Minimal Assistance - Patient > 75%     Toileting Toileting    Toileting assist Assist for toileting: Moderate Assistance - Patient 50 - 74%     Transfers Chair/bed transfer  Transfers assist     Chair/bed transfer assist level: Minimal Assistance - Patient > 75%      Locomotion Ambulation   Ambulation assist   Ambulation activity did not occur: Safety/medical concerns (lightheadedness, nausea, headache)  Assist level: Minimal Assistance - Patient > 75% Assistive device: Walker-rolling Max distance: 70ft   Walk 10 feet activity   Assist  Walk 10 feet activity did not occur: Safety/medical concerns (lightheadedness, nausea, headache)  Assist level: Minimal Assistance - Patient > 75% Assistive device: Walker-rolling   Walk 50 feet activity   Assist Walk 50 feet with 2 turns activity did not occur: Safety/medical concerns (lightheadedness, nausea, headache)         Walk 150 feet activity   Assist Walk 150 feet activity did not occur: Safety/medical concerns (lightheadedness, nausea, headache)         Walk 10 feet on uneven surface  activity   Assist Walk 10 feet on uneven surfaces activity did not occur: Safety/medical concerns (lightheadedness, nausea, headache)         Wheelchair     Assist Is the patient using a wheelchair?: Yes Type of Wheelchair: Manual    Wheelchair assist level: Dependent - Patient 0% Max wheelchair distance: >355ft    Wheelchair 50 feet with 2 turns activity    Assist        Assist Level: Dependent - Patient 0%   Wheelchair 150 feet activity     Assist      Assist Level: Dependent - Patient 0%   Blood pressure 97/61, pulse 89, temperature 98.6 F (37 C), resp. rate 18, SpO2 97%.  Medical Problem List and Plan: 1. Functional deficits secondary to metastatic breast cancer with metastasis to right femur.  Status post prophylactic intramedullary nail of right femur for right femur lytic lesions 08/20/2023.  Weightbearing as tolerated             -  patient may  shower starting 12/10             -ELOS/Goals: 10-12d  D3 ordered 2.  Multiple DVTs: Eliquis started 3. Headache: Fioricet ordered x1, topamax ordered to replace gabapentin HS. Resolved.   4. Mood/Behavior/Sleep:  Provide emotional support             -antipsychotic agents: N/A 5. Neuropsych/cognition: This patient is capable of making decisions on her own behalf. 6. Skin/Wound Care: Routine skin checks 7. Fluids/Electrolytes/Nutrition: Routine in and outs with follow-up chemistries  8.  Acute blood loss anemia.  Follow-up CBC. CT abdomen and stool occult ordered, CT abdomen reviewed and is negative for bleed  9.  Hypothyroidism.  Continue Synthroid  10.  Orthostasis:  D/c prn anti-hypertensives, teds ordered   11.  Peripheral neuropathy due to chemotherapy    12.  Macrocytic anemia: B12 reviewed and elevated, will add oral B complex  13.  Hx metastatic brain lesion s/p radiation therapy Oct 2023. Palliative care consulted  14.  Dysphagia like due to radiation therapy D3 diet  15. Nausea: resolved, d/c scopolamine patch, discussed that this is likely 2/2 constipation  16. Pain from IV: placed order for IV removal  17. Constipation: dulcolax ordered, sorbitol ordered, if ineffective discussed with PA ordering enema  18. AMS: CT Head ordered and is stable, improved     LOS: 4 days A FACE TO FACE EVALUATION WAS PERFORMED  Clint Bolder P Mairlyn Tegtmeyer 08/27/2023, 1:34 PM

## 2023-08-27 NOTE — Progress Notes (Signed)
Enema completed, medium type 1 stool manually disimpacted from pt rectum. [name], LPN   16/10/96 0454  Urine Characteristics  Urinary Incontinence No  Urine Color Amber  Urine Appearance Clear  Urinary Interventions Intermittent/Straight cath  Intermittent/Straight Cath (mL) 200 mL  Hygiene Peri care  Stool Characteristics  Bowel Incontinence No  Stool Type Type 1 (Separate hard lumps)  Stool Descriptors Brown  Stool Amount Medium  Stool Source Rectum

## 2023-08-27 NOTE — Progress Notes (Signed)
Encouraged pt do drink more fluids. Urine concentrated and dark yellow.   08/27/23 1230  Urine Characteristics  Urinary Incontinence No  Urine Color Amber  Urine Appearance Clear  Urinary Interventions Intermittent/Straight cath  Intermittent/Straight Cath (mL) 200 mL  Hygiene Peri care

## 2023-08-27 NOTE — Progress Notes (Signed)
Physical Therapy Session Note  Patient Details  Name: Denise [name] MRN: [id number] Date of Birth: [birth year]  Today's Date: 08/27/2023 PT Individual Time: 1100-1140 and 1330-1356 PT Individual Time Calculation (min): 40 min  and 26 min Today's Date: 08/27/2023 PT Missed Time: 20 Minutes Missed Time Reason: Patient fatigue  Short Term Goals: Week 1:  PT Short Term Goal 1 (Week 1): pt will transfer sit<>stand with LRAD and CGA PT Short Term Goal 2 (Week 1): pt will transfer bed<>chair with LRAD and CGA PT Short Term Goal 3 (Week 1): pt will ambulate 49ft with LRAD and CGA  Skilled Therapeutic Interventions/Progress Updates:   Treatment Session 1 Received pt semi-reclined in bed, pt agreeable to PT treatment, and reported pain in R hip but unable to rate - RN notified of request for pain medication. Pt appeared more alert, oriented, and communicative today > yesterday but still not as energetic as she was Wednesday of this week. Session with emphasis on functional mobility/transfers, generalized strengthening and endurance, and attempting OOB mobility.   Donned teds and non-skid socks in supine with total A and pt transferred semi-reclined<>sitting R EOB with HOB elevated and use of bedrails with more than adequate time and mod A for BLE management and trunk control. Pt with mild R lateral lean to start but able to maintain static sitting balance with close supervision initially with heavy reliance on BUE support. Pt able to scoot to EOB with increased time but no physical assist and attempted x 2 to stand from elevated EOB with RW, however, despite max/total A, pt unable to stand. Pt c/o feeling "weak" and began demoing anterior LOB, requiring mod A for static sitting balance. While waiting for RN to [employer], worked on sitting balance and upright posture but pt politely declined OOB mobility due to feeling weak and requested to lie back down. Transferred into supine with max A and  required +2 assist to scoot to [facility].   Discussed DME and recommended getting WC and hospital bed (pt reports already having RW) and will need 18x16 manual WC with standard legrests - CSW aware. Concluded session with pt semi-reclined in bed, needs within reach, and bed alarm on. Safety plan updated for nursing to use [facility] and pt's POC adjusted due to poor tolerance to therapy from numerous medical conditions. 20 minutes missed of skilled physical therapy due to fatigue.   Treatment Session 2 Received pt semi-reclined in bed, pt agreeable to PT treatment, and reported pain in R hip - RN notified. Pt reporting still feeling weak and requesting to work on bed level exercises to save energy to hopefully get OOB during upcoming OT session. Pt performed the following exercises with emphasis on LE strength/ROM: -SAQ x10 bilaterally -hip abduction x8 bilaterally -AAROM SLR x18 bilaterally  -hip adduction pillow isometrics x10 with 3 second hold -ankle circles x10 clockwise and counterclockwise bilaterally Pt instructed to count reps but frequently losing track, forgetting what number she was on. Concluded session with pt semi-reclined in bed, needs within reach, and bed alarm on.   Therapy Documentation Precautions:  Precautions Precautions: Fall Precaution Comments: active cancer wtih chemo port, monitor BP Restrictions Weight Bearing Restrictions Per Provider Order: No RLE Weight Bearing Per Provider Order: Weight bearing as tolerated  Therapy/Group: Individual Therapy [name] PT, DPT 08/27/2023, 7:09 AM

## 2023-08-27 NOTE — Progress Notes (Signed)
Patient ID: Denise [name], female   DOB: August 08, 1951, 72 y.o.   MRN: [id number]  HB and WC ordered through [employer].

## 2023-08-27 NOTE — Progress Notes (Signed)
Occupational Therapy Session Note  Patient Details  Name: Denise [name] MRN: [id number] Date of Birth: November 07, 1950  Today's Date: 08/27/2023 OT Individual Time: 4098-1191 OT Individual Time Calculation (min): 30 min  and Today's Date: 08/27/2023 OT Missed Time: 45 Minutes Missed Time Reason: Patient fatigue;Other (comment) (palliative consult)   Short Term Goals: Week 1:  OT Short Term Goal 1 (Week 1): Pt will complete sit > stand in prep for ADL with CGA using LRAD OT Short Term Goal 2 (Week 1): Pt will thread LB clothing with AE PRN, with supervision OT Short Term Goal 3 (Week 1): Pt will complete toilet transfer with CGA using LRAD OT Short Term Goal 4 (Week 1): Pt will improve endurance to sitting EOB/sink for at least 10 mins to promote ability to complete bathing at shower level  Skilled Therapeutic Interventions/Progress Updates:  Skilled OT intervention completed with focus on activity tolerance, bed mobility, static sitting balance. Pt received supine in bed. Pt verbalized fatigue and dizziness but agreeable to session. RLE pain reported; pre-medicated. OT offered repositioning throughout for pain reduction.  Pt motivated to get OOB in order to get out of room for overall well being and emotional support. Pt required max A for bed mobility to EOB for BLE and trunk support (pt normally able to do with supervision with + time). She required mod A for static sitting balance, fading to close supervision however greatly limited by dizziness, fatigue and "sore throat." Pt was able to lean > Rt for slideboard placement, however upon sitting back up, pt's BUE began to tremble with fatigue, and despite gentle encouragement that OT could get her to the w/c, pt politely declined, requesting to lay back down. Max A needed to return supine. OT doffed knee high TEDs; pt's room noted to be on high heat and pt with clammy skin but reported being cold. Pt required +2 to boost > HOB with pt assisting  using BLE in bridge position.   Palliative team present for 2nd or 3rd attempt for consult, therefore deferred session for important POC planning. Pt missed 45 mins of OT intervention; OT will make up missed time as able.  Pt remained upright in bed, with bed alarm on/activated, and with all needs in reach at end of session. Nurse made aware of pt's status and transfer recommendation of maxi move for safety due to medical/physical decline of pt and pt no longer safe to complete stand pivot or STEDY transfers.   Therapy Documentation Precautions:  Precautions Precautions: Fall Precaution Comments: active cancer wtih chemo port, monitor BP Restrictions Weight Bearing Restrictions Per Provider Order: No RLE Weight Bearing Per Provider Order: Weight bearing as tolerated    Therapy/Group: Individual Therapy  Melvyn Novas, MS, OTR/L  08/27/2023, 3:29 PM

## 2023-08-27 NOTE — Progress Notes (Signed)
 Patient ID: Denise [name], female   DOB: [birth year], 72 y.o.   MRN: [id number]  No DME or follow up recommendations currently.

## 2023-08-27 NOTE — Progress Notes (Signed)
Speech Language Pathology Daily Session Note  Patient Details  Name: Denise [name] MRN: [id number] Date of Birth: 01-16-1951  Today's Date: 08/27/2023 SLP Individual Time: 1030-1100 SLP Individual Time Calculation (min): 30 min  Short Term Goals: Week 1: SLP Short Term Goal 1 (Week 1): STGs=LTGs d/t ELOS  Skilled Therapeutic Interventions:   Patient was seen in PM to address dysphagia management. She was alert and seated upright in bed upon SLP arrival. Upon inquiry pt endorsed fatigue during meals, nausea, and difficulty with mastication. All communication requiring extensive time with pt observed with difficulty expressing her self. Question fatigue vs. language vs. Pain. Pt declined PO solid trials however agreeable for ice chips. SLP presented x8 ttrials with pt demonstrating adeqaute bolus containement, mastication, transit, and suspected deglutition. She was without s/sx pen/asp. SLP reviewed energy conservation methods during meals including having assitance with feeding, limiting prior activity before meals,   Pain  Pt appeared to be in pain c/b grunting and restless movement. She was unable to verbalize a pain number however expressed pain was on her "back side"  Therapy/Group: Individual Therapy  Renaee Munda 08/27/2023, 12:51 PM

## 2023-08-27 NOTE — Progress Notes (Signed)
Patient ID: Denise [name], female   DOB: 05-05-1951, 72 y.o.   MRN: [id number]  [facility] referral sent to [facility]

## 2023-08-28 DIAGNOSIS — F54 Psychological and behavioral factors associated with disorders or diseases classified elsewhere: Secondary | ICD-10-CM

## 2023-08-28 DIAGNOSIS — M899 Disorder of bone, unspecified: Secondary | ICD-10-CM | POA: Diagnosis not present

## 2023-08-28 NOTE — Progress Notes (Signed)
PROGRESS NOTE   Subjective/Complaints:  Pt  denies pain; LBM yesterday- finally went, but had moderately constipation- is resolved now.    ROS: +lightheadedness- better, +nausea- better, +somnolence, +constipation- better  Pt denies SOB, abd pain, CP, N/V/C/D, and vision changes   Objective:   No results found.  Recent Labs    08/26/23 0432  WBC 6.7  HGB 8.5*  HCT 25.8*  PLT 155   Recent Labs    08/27/23 1512  NA 131*  K 4.2  CL 103  CO2 20*  GLUCOSE 94  BUN 21  CREATININE 0.97  CALCIUM 7.7*     Intake/Output Summary (Last 24 hours) at 08/28/2023 1446 Last data filed at 08/28/2023 1330 Gross per 24 hour  Intake 170 ml  Output 925 ml  Net -755 ml        Physical Exam: Vital Signs Blood pressure 104/73, pulse 77, temperature 97.7 F (36.5 C), temperature source Oral, resp. rate 16, SpO2 99%. Skin:    Comments: Right hip incision dressed.  Appropriately tender  Neurological:     Comments: Patient is alert and follows commands, but more fatigued and with delayed processing today     General: awake, alert, appropriate,  frail appearing- sitting up in bed; NAD HENT: conjugate gaze; oropharynx moist-doesn't have her teeth CV: regular rate and rhythm; no JVD Pulmonary: CTA B/L; no W/R/R- good air movement GI: soft, NT, ND, (+)BS Psychiatric: appropriate- anxious about possibly losing  dentures Neurological: Ox3  Skin: No evidence of breakdown, no evidence of rash Neurologic: mildly delayed responses with mild dysarthria motor strength is 5/5 in bilateral deltoid, bicep, tricep, grip, hip flexor, 3/5 in lower extremities Sensory exam normal sensation to light touch  in bilateral upper and lower extremities, stable 12/13   Musculoskeletal: Full range of motion in BUE No joint swelling Post op Right thigh edema with ecchymosis posterior to hip incisions Fullness and mild tenderness Right popliteal  fossa     Assessment/Plan: 1. Functional deficits which require 3+ hours per day of interdisciplinary therapy in a comprehensive inpatient rehab setting. Physiatrist is providing close team supervision and 24 hour management of active medical problems listed below. Physiatrist and rehab team continue to assess barriers to discharge/monitor patient progress toward functional and medical goals  Care Tool:  Bathing    Body parts bathed by patient: Right arm, Left arm, Chest, Abdomen, Front perineal area, Buttocks, Right upper leg, Left upper leg, Face   Body parts bathed by helper: Right lower leg, Left lower leg     Bathing assist Assist Level: Minimal Assistance - Patient > 75%     Upper Body Dressing/Undressing Upper body dressing   What is the patient wearing?: Pull over shirt    Upper body assist Assist Level: Supervision/Verbal cueing    Lower Body Dressing/Undressing Lower body dressing      What is the patient wearing?: Pants     Lower body assist Assist for lower body dressing: Minimal Assistance - Patient > 75%     Toileting Toileting    Toileting assist Assist for toileting: Moderate Assistance - Patient 50 - 74%     Transfers Chair/bed transfer  Transfers  assist     Chair/bed transfer assist level: Minimal Assistance - Patient > 75%     Locomotion Ambulation   Ambulation assist   Ambulation activity did not occur: Safety/medical concerns (lightheadedness, nausea, headache)  Assist level: Minimal Assistance - Patient > 75% Assistive device: Walker-rolling Max distance: 48ft   Walk 10 feet activity   Assist  Walk 10 feet activity did not occur: Safety/medical concerns (lightheadedness, nausea, headache)  Assist level: Minimal Assistance - Patient > 75% Assistive device: Walker-rolling   Walk 50 feet activity   Assist Walk 50 feet with 2 turns activity did not occur: Safety/medical concerns (lightheadedness, nausea, headache)          Walk 150 feet activity   Assist Walk 150 feet activity did not occur: Safety/medical concerns (lightheadedness, nausea, headache)         Walk 10 feet on uneven surface  activity   Assist Walk 10 feet on uneven surfaces activity did not occur: Safety/medical concerns (lightheadedness, nausea, headache)         Wheelchair     Assist Is the patient using a wheelchair?: Yes Type of Wheelchair: Manual    Wheelchair assist level: Dependent - Patient 0% Max wheelchair distance: >362ft    Wheelchair 50 feet with 2 turns activity    Assist        Assist Level: Dependent - Patient 0%   Wheelchair 150 feet activity     Assist      Assist Level: Dependent - Patient 0%   Blood pressure 104/73, pulse 77, temperature 97.7 F (36.5 C), temperature source Oral, resp. rate 16, SpO2 99%.  Medical Problem List and Plan: 1. Functional deficits secondary to metastatic breast cancer with metastasis to right femur.  Status post prophylactic intramedullary nail of right femur for right femur lytic lesions 08/20/2023.  Weightbearing as tolerated             -patient may  shower starting 12/10             -ELOS/Goals: 10-12d  D3 ordered  Con't CIR  2.  Multiple DVTs: Eliquis started 3. Headache: Fioricet ordered x1, topamax ordered to replace gabapentin HS. Resolved.   4. Mood/Behavior/Sleep: Provide emotional support             -antipsychotic agents: N/A 5. Neuropsych/cognition: This patient is capable of making decisions on her own behalf. 6. Skin/Wound Care: Routine skin checks 7. Fluids/Electrolytes/Nutrition: Routine in and outs with follow-up chemistries  8.  Acute blood loss anemia.  Follow-up CBC. CT abdomen and stool occult ordered, CT abdomen reviewed and is negative for bleed  12/14- Hb 8.5- has been dwindling-  9.  Hypothyroidism.  Continue Synthroid  10.  Orthostasis:  D/c prn anti-hypertensives, teds ordered   11.  Peripheral neuropathy due to  chemotherapy    12.  Macrocytic anemia: B12 reviewed and elevated, will add oral B complex  13.  Hx metastatic brain lesion s/p radiation therapy Oct 2023. Palliative care consulted  14.  Dysphagia like due to radiation therapy D3 diet  15. Nausea: resolved, d/c scopolamine patch, discussed that this is likely 2/2 constipation  16. Pain from IV: placed order for IV removal  17. Constipation: dulcolax ordered, sorbitol ordered, if ineffective discussed with PA ordering enema  12/14- Had good Bms' feeling better 18. AMS: CT Head ordered and is stable, improved  I spent a total of 37   minutes on total care today- >50% coordination of care- due to  D/w team multiple times about dentures- they finally found- asked them and pt to not put on tray!   LOS: 5 days A FACE TO FACE EVALUATION WAS PERFORMED  Ashish Rossetti 08/28/2023, 2:46 PM

## 2023-08-28 NOTE — Plan of Care (Signed)
  Problem: Consults Goal: RH GENERAL PATIENT EDUCATION Description: See Patient Education module for education specifics. Outcome: Progressing   Problem: RH BOWEL ELIMINATION Goal: RH STG MANAGE BOWEL WITH ASSISTANCE Description: STG Manage Bowel with mod I Assistance. Outcome: Progressing Goal: RH STG MANAGE BOWEL W/MEDICATION W/ASSISTANCE Description: STG Manage Bowel with Medication with mod I Assistance. Outcome: Progressing   Problem: RH SAFETY Goal: RH STG ADHERE TO SAFETY PRECAUTIONS W/ASSISTANCE/DEVICE Description: STG Adhere to Safety Precautions With cues Assistance/Device. Outcome: Progressing   Problem: RH PAIN MANAGEMENT Goal: RH STG PAIN MANAGED AT OR BELOW PT'S PAIN GOAL Description: < 4 with prns Outcome: Progressing   Problem: RH KNOWLEDGE DEFICIT GENERAL Goal: RH STG INCREASE KNOWLEDGE OF SELF CARE AFTER HOSPITALIZATION Description: Patient will be able to manage care at discharge using educational resources for medications and dietary modification, independently Outcome: Progressing

## 2023-08-28 NOTE — Progress Notes (Signed)
Speech Language Pathology Daily Session Note  Patient Details  Name: Denise [name] MRN: [id number] Date of Birth: 11-28-1950  Today's Date: 08/28/2023 SLP Individual Time: 1300-1330 SLP Individual Time Calculation (min): 30 min  Short Term Goals: Week 1: SLP Short Term Goal 1 (Week 1): STGs=LTGs d/t ELOS  Skilled Therapeutic Interventions: Pt seen for skilled ST with focus on swallowing goals and family education, pt in bed with outside lunch meal and daughter present throughout. SLP introducing self and role as well as overall deferment to Palliative/Hospice care team for diet recommendations that are in line with patient's quality of life desires. Pt reports significant dietary restrictions d/t multiple medical conditions. SLP, patient and family discussing high protein foods and 5 smaller meals/day vs 3 large meals to promote energy conservation. Discussed hydration techniques and patient's food preferences that work for her orally and fit her quality of life goals. Pt consuming single [place] and ~10 bites of vanilla milkshake, no overt s/s aspiration exhibited with observed mild extended mastication of french fry. SLP providing education on general swallow strategies, primarily alternating solids and liquids at 1:1 ratio. All questions answered to patient and daughter's satisfaction, will follow up per ST POC.   Pain Pain Assessment Pain Scale: 0-10 Pain Score: 0-No pain  Therapy/Group: Individual Therapy  Tacey Ruiz 08/28/2023, 1:30 PM

## 2023-08-28 NOTE — Progress Notes (Signed)
Awaiting patient to have BM to obtain hemoccult.   Tilden Dome, LPN

## 2023-08-28 NOTE — Progress Notes (Signed)
Physical Therapy Session Note  Patient Details  Name: Denise [name] MRN: [id number] Date of Birth: Oct 11, 1950  Today's Date: 08/28/2023 PT Individual Time: 1445-1530 PT Individual Time Calculation (min): 45 min   Short Term Goals: Week 1:  PT Short Term Goal 1 (Week 1): pt will transfer sit<>stand with LRAD and CGA PT Short Term Goal 2 (Week 1): pt will transfer bed<>chair with LRAD and CGA PT Short Term Goal 3 (Week 1): pt will ambulate 39ft with LRAD and CGA  Skilled Therapeutic Interventions/Progress Updates:  Pt was seen bedside in the pm with daughter [name], present for family training. Pt utilizing bathroom. Pt transferred sit to stand with grab bar and min to mod A twice to allow hygiene and pulling up clothes. Pt transferred toilet to w/c with mod A squat pivot transfer, pt abruptly sat during transfer due to fatigue. Pt initially reluctant to perform family training stating I am shaking right now and just can't do it. Therapist discuss with pt need for family training and if she feels like this at hoe daughter will have to transfer her. Discussed with daughter pt's current level and recommendation that due to fluctuating transfer status pt may need a hoyer lift. Pt continues to focus on need to go home and try it and if it doesn't work out I will go to facility. Explained to patient that if she has concerns it may be better to get them worked out prior to going home. Pt agreed to perform w/c to bed transfer only stating that was all she could do right now. Pt and daughter transferred w/c to edge of bed with squat pivot transfer and mod A with verbal cues from therapist. Pt abruptly sat on daughter but completed transfer. Pt then transferred edge of bed to supine with min A and verbal cues. Pt's daughter was presently surprised with transfers. Therapist continued to educate pt and daughter and fact transfers will very from day to day and may benefit from manual lift. Pt stated she needed  to talk with hospice people. Pt and daughter stated they needed to figure some things out. Pt left sitting up in bed with daughter at bedside and all needs within reach.   Therapy Documentation Precautions:  Precautions Precautions: Fall Precaution Comments: active cancer wtih chemo port, monitor BP Restrictions Weight Bearing Restrictions Per Provider Order: No RLE Weight Bearing Per Provider Order: Weight bearing as tolerated General:   Vital Signs:  Pain: Pain Assessment Pain Scale: 0-10 Pain Score: 0-No pain  Therapy/Group: Individual Therapy  Rayford Halsted 08/28/2023, 3:55 PM

## 2023-08-28 NOTE — Consult Note (Signed)
  08/27/2023: Denise [name] is a 72 year old female referred for neuropsychological consultation due to coping and adjustment issues with metastatic cancer and current admission onto the comprehensive inpatient rehabilitation unit.  The patient has had recent consult with palliative care to go over needs.  When I met with the patient today, she was oriented but very slow cognitive processing and very limited energy with clear fatigue.  Patient was able to verbalize understanding of her medical status but acknowledged significant depressive symptoms and coping difficulties for the most part although she has by her admission become resigned to the fact of her very poor medical prognosis.  The patient reports that she is so fatigued that is very difficult for her to participate in the therapeutic interventions.  We discussed in depth what have been happening with her interactions and discussions around palliative care and patient verbalized clear understanding of her status and agreement with the discussions that have been around palliative care itself.0.

## 2023-08-28 NOTE — Progress Notes (Signed)
Patient's dentures found in the bottom of personal belongings bag.     Tilden Dome, LPN

## 2023-08-29 ENCOUNTER — Encounter (HOSPITAL_COMMUNITY): Payer: Self-pay | Admitting: Orthopaedic Surgery

## 2023-08-29 DIAGNOSIS — M899 Disorder of bone, unspecified: Secondary | ICD-10-CM | POA: Diagnosis not present

## 2023-08-29 NOTE — Progress Notes (Signed)
08/29/23 0520 08/29/23 0525 08/29/23 0544  What Happened  Was fall witnessed? Yes  --   --   Who witnessed fall? NT [place]  --   --   Patients activity before fall bathroom-assisted  --   --   Point of contact buttocks  --   --   Was patient injured? No  --   --   Provider Notification  Provider Name/Title  --   --  Rhona Raider  Date Provider Notified  --   --  08/29/23  Time Provider Notified  --   --  [phone]  Method of Notification  --   --  Call  Notification Reason  --   --  Fall  Provider response  --   --  No new orders  Date of Provider Response  --   --  08/29/23  Time of Provider Response  --   --  [phone]  Follow Up  Family notified  --   --   --   Time family notified  --   --   --   Additional tests  --   --   --   Adult Fall Risk Assessment  Risk Factor Category (scoring not indicated) High fall risk per protocol (document High fall risk)  --  Fall has occurred during this admission (document High fall risk)  Patient Fall Risk Level High fall risk  --  High fall risk  Adult Fall Risk Interventions  Required Bundle Interventions *See Row Information* High fall risk - low, moderate, and high requirements implemented  --  High fall risk - low, moderate, and high requirements implemented  Vitals  Temp  --  97.9 F (36.6 C)  --   BP  --  107/71  --   MAP (mmHg)  --  82  --   BP Location  --  Right Arm  --   BP Method  --  Automatic  --   Patient Position (if appropriate)  --  Lying  --   Pulse Rate  --  78  --   Pulse Rate Source  --  Monitor  --   Resp  --  18  --   Oxygen Therapy  SpO2  --  97 %  --   O2 Device Room Air Room Air  --   Pain Assessment  Pain Score 0  --   --   PCA/Epidural/Spinal Assessment  Respiratory Pattern Regular;Unlabored  --   --   Neurological  Level of Consciousness Alert  --   --   Orientation Level Oriented X4  --   --   RUE Motor Response Purposeful movement  --   --   LUE Motor Response Purposeful movement  --   --   RLE Motor  Response Purposeful movement  --   --   LLE Motor Response Purposeful movement  --   --   Glasgow Coma Scale  Eye Opening 4  --   --   Best Verbal Response (NON-intubated) 5  --   --   Best Motor Response 6  --   --   Glasgow Coma Scale Score 15  --   --   Integumentary  Skin Integrity Other (Comment) (existing surgical incision)  --   --   Ecchymosis Location Knee;Buttocks  --   --   Ecchymosis Location Orientation Right;Left (right lateral knee, left buttock)  --   --     08/29/23 0547  What Happened  Was fall witnessed?  --   Who witnessed fall?  --   Patients activity before fall  --   Point of contact  --   Was patient injured?  --   Provider Notification  Provider Name/Title  --   Date Provider Notified  --   Time Provider Notified  --   Method of Notification  --   Notification Reason  --   Provider response  --   Date of Provider Response  --   Time of Provider Response  --   Follow Up  Family notified Yes - comment (Daughter Lauralee Evener)  Time family notified [phone]  Additional tests No  Adult Fall Risk Assessment  Risk Factor Category (scoring not indicated)  --   Patient Fall Risk Level  --   Adult Fall Risk Interventions  Required Bundle Interventions *See Row Information*  --   Vitals  Temp  --   BP  --   MAP (mmHg)  --   BP Location  --   BP Method  --   Patient Position (if appropriate)  --   Pulse Rate  --   Pulse Rate Source  --   Resp  --   Oxygen Therapy  SpO2  --   O2 Device  --   Pain Assessment  Pain Score  --   PCA/Epidural/Spinal Assessment  Respiratory Pattern  --   Neurological  Level of Consciousness  --   Orientation Level  --   RUE Motor Response  --   LUE Motor Response  --   RLE Motor Response  --   LLE Motor Response  --   Glasgow Coma Scale  Eye Opening  --   Best Verbal Response (NON-intubated)  --   Best Motor Response  --   Glasgow Coma Scale Score  --   Integumentary  Skin Integrity  --   Ecchymosis Location   --   Ecchymosis Location Orientation  --

## 2023-08-29 NOTE — Progress Notes (Signed)
PROGRESS NOTE   Subjective/Complaints:  Pt reports difficulty swallowing- but allergic to so many foods, hard to pick/choose- daughter brought in food yesterday.  Chokes on many foods. Had sherbert and icecream with no difficulties- ordered french toast- - said choking due to radiation stricture.  LBM this AM -large.     ROS: +lightheadedness- better, +nausea- better, +somnolence, +constipation- better   Pt denies SOB, abd pain, CP, N/V/C/D, and vision changes  Objective:   No results found.  No results for input(s): "WBC", "HGB", "HCT", "PLT" in the last 72 hours.  Recent Labs    08/27/23 1512  NA 131*  K 4.2  CL 103  CO2 20*  GLUCOSE 94  BUN 21  CREATININE 0.97  CALCIUM 7.7*     Intake/Output Summary (Last 24 hours) at 08/29/2023 1244 Last data filed at 08/28/2023 1830 Gross per 24 hour  Intake 360 ml  Output 800 ml  Net -440 ml        Physical Exam: Vital Signs Blood pressure 107/71, pulse 78, temperature 97.9 F (36.6 C), resp. rate 18, SpO2 97%. Skin:    Comments: Right hip incision dressed.  Appropriately tender  Neurological:     Comments: Patient is alert and follows commands, but more fatigued and with delayed processing today      General: awake, alert, appropriate, sitting up in bed- but not at 90 degrees; nursing in room for part of visit; NAD HENT: conjugate gaze; oropharynx moist CV: regular rate and rhythm; no JVD Pulmonary: CTA B/L; no W/R/R- good air movement GI: soft, NT, ND, (+)BS Psychiatric: appropriate Neurological: Ox3- able to tell us about her swallowing difficulties and asking appropriate questions- still delayed responses   Skin: No evidence of breakdown, no evidence of rash Neurologic: mildly delayed responses with mild dysarthria motor strength is 5/5 in bilateral deltoid, bicep, tricep, grip, hip flexor, 3/5 in lower extremities Sensory exam normal sensation to  light touch  in bilateral upper and lower extremities, stable 12/13   Musculoskeletal: Full range of motion in BUE No joint swelling Post op Right thigh edema with ecchymosis posterior to hip incisions Fullness and mild tenderness Right popliteal fossa     Assessment/Plan: 1. Functional deficits which require 3+ hours per day of interdisciplinary therapy in a comprehensive inpatient rehab setting. Physiatrist is providing close team supervision and 24 hour management of active medical problems listed below. Physiatrist and rehab team continue to assess barriers to discharge/monitor patient progress toward functional and medical goals  Care Tool:  Bathing    Body parts bathed by patient: Right arm, Left arm, Chest, Abdomen, Front perineal area, Buttocks, Right upper leg, Left upper leg, Face   Body parts bathed by helper: Right lower leg, Left lower leg     Bathing assist Assist Level: Minimal Assistance - Patient > 75%     Upper Body Dressing/Undressing Upper body dressing   What is the patient wearing?: Pull over shirt    Upper body assist Assist Level: Supervision/Verbal cueing    Lower Body Dressing/Undressing Lower body dressing      What is the patient wearing?: Pants     Lower body assist Assist for lower  body dressing: Minimal Assistance - Patient > 75%     Toileting Toileting    Toileting assist Assist for toileting: Moderate Assistance - Patient 50 - 74%     Transfers Chair/bed transfer  Transfers assist     Chair/bed transfer assist level: Moderate Assistance - Patient 50 - 74%     Locomotion Ambulation   Ambulation assist   Ambulation activity did not occur: Safety/medical concerns (lightheadedness, nausea, headache)  Assist level: Minimal Assistance - Patient > 75% Assistive device: Walker-rolling Max distance: 64ft   Walk 10 feet activity   Assist  Walk 10 feet activity did not occur: Safety/medical concerns (lightheadedness,  nausea, headache)  Assist level: Minimal Assistance - Patient > 75% Assistive device: Walker-rolling   Walk 50 feet activity   Assist Walk 50 feet with 2 turns activity did not occur: Safety/medical concerns (lightheadedness, nausea, headache)         Walk 150 feet activity   Assist Walk 150 feet activity did not occur: Safety/medical concerns (lightheadedness, nausea, headache)         Walk 10 feet on uneven surface  activity   Assist Walk 10 feet on uneven surfaces activity did not occur: Safety/medical concerns (lightheadedness, nausea, headache)         Wheelchair     Assist Is the patient using a wheelchair?: Yes Type of Wheelchair: Manual    Wheelchair assist level: Dependent - Patient 0% Max wheelchair distance: >383ft    Wheelchair 50 feet with 2 turns activity    Assist        Assist Level: Dependent - Patient 0%   Wheelchair 150 feet activity     Assist      Assist Level: Dependent - Patient 0%   Blood pressure 107/71, pulse 78, temperature 97.9 F (36.6 C), resp. rate 18, SpO2 97%.  Medical Problem List and Plan: 1. Functional deficits secondary to metastatic breast cancer with metastasis to right femur.  Status post prophylactic intramedullary nail of right femur for right femur lytic lesions 08/20/2023.  Weightbearing as tolerated             -patient may  shower starting 12/10             -ELOS/Goals: 10-12d  D3 ordered  Con't CIR PT and OT 2.  Multiple DVTs: Eliquis started 3. Headache: Fioricet ordered x1, topamax ordered to replace gabapentin HS. Resolved.   4. Mood/Behavior/Sleep: Provide emotional support             -antipsychotic agents: N/A 5. Neuropsych/cognition: This patient is capable of making decisions on her own behalf. 6. Skin/Wound Care: Routine skin checks 7. Fluids/Electrolytes/Nutrition: Routine in and outs with follow-up chemistries  8.  Acute blood loss anemia.  Follow-up CBC. CT abdomen and stool  occult ordered, CT abdomen reviewed and is negative for bleed  12/14- Hb 8.5- has been dwindling- 12/15- labs in AM  9.  Hypothyroidism.  Continue Synthroid  10.  Orthostasis:  D/c prn anti-hypertensives, teds ordered   11.  Peripheral neuropathy due to chemotherapy    12.  Macrocytic anemia: B12 reviewed and elevated, will add oral B complex  13.  Hx metastatic brain lesion s/p radiation therapy Oct 2023. Palliative care consulted  14.  Dysphagia like due to radiation therapy D3 diet  12/15- wasn't on D3 diet- changed back- since cannot tolerate regular diet- found dentures- also not drinking well.  Per nurse- very little- pt says she vomits up food, not digested when  eats something not soft and sometimes liquids.  15. Nausea: resolved, d/c scopolamine patch, discussed that this is likely 2/2 constipation  16. Pain from IV: placed order for IV removal  17. Constipation: dulcolax ordered, sorbitol ordered, if ineffective discussed with PA ordering enema  12/14- Had good Bms' feeling better 18. AMS: CT Head ordered and is stable, improved 19. Poor intake, poor output  12/15- bladder scan for 54 cc- after no void for 6 hours- will ask nursing to push fluids, but check labs in AM to make sure she's not dehydrated- is going on hospice when goes home, but concerned if there's anything we can do? IVFs, etc.    I spent a total of  36   minutes on total care today- >50% coordination of care- due to  Spoke to nursing twice about output- and intake- pushing to take in fluids, if possible- and my concerns about being dehydrated   LOS: 6 days A FACE TO FACE EVALUATION WAS PERFORMED  Denise [name] 08/29/2023, 12:44 PM

## 2023-08-29 NOTE — Progress Notes (Signed)
[facility] [phone] [facility] Liaison Note  Received request from [facility] for hospice services at home after discharge. Spoke with patient and daughter at bedside to initiate education related to hospice philosophy, services and team approach to care. Both verbalized understanding of information given. Per discussion, the plan is for discharge home on Monday 12.17.  DME needs discussed. Patient has the following equipment in the home: rollator, rolling walker, cane Patient requests the following equipment for delivery: hospital bed, wheelchair  Please send signed and completed DNR home with patient/family. Please provide prescriptions at discharge as needed to ensure ongoing symptom management.  [facility] information and contact numbers given to [place]. Please call with any concerns.  Thank you for the opportunity to participate in this patient's care.   Henderson Newcomer, LPN [facility] Liaison [phone]

## 2023-08-29 NOTE — Progress Notes (Signed)
Encouraged pt to drink fluids each several times throughout shift. Pt finished 118 ml of water. Pt verbalized that "she will try her best". Educated pt importance of fluid intake. Pt verbalizes understanding. [name], LPN

## 2023-08-30 ENCOUNTER — Other Ambulatory Visit (HOSPITAL_COMMUNITY): Payer: Self-pay

## 2023-08-30 ENCOUNTER — Encounter (HOSPITAL_COMMUNITY): Payer: Self-pay | Admitting: Hematology

## 2023-08-30 ENCOUNTER — Encounter: Payer: Self-pay | Admitting: Hematology

## 2023-08-30 DIAGNOSIS — M899 Disorder of bone, unspecified: Secondary | ICD-10-CM | POA: Diagnosis not present

## 2023-08-30 LAB — CBC WITH DIFFERENTIAL/PLATELET
Abs Immature Granulocytes: 0.02 10*3/uL (ref 0.00–0.07)
Basophils Absolute: 0 10*3/uL (ref 0.0–0.1)
Basophils Relative: 0 %
Eosinophils Absolute: 0.1 10*3/uL (ref 0.0–0.5)
Eosinophils Relative: 1 %
HCT: 27.1 % — ABNORMAL LOW (ref 36.0–46.0)
Hemoglobin: 9 g/dL — ABNORMAL LOW (ref 12.0–15.0)
Immature Granulocytes: 0 %
Lymphocytes Relative: 16 %
Lymphs Abs: 0.8 10*3/uL (ref 0.7–4.0)
MCH: 35.2 pg — ABNORMAL HIGH (ref 26.0–34.0)
MCHC: 33.2 g/dL (ref 30.0–36.0)
MCV: 105.9 fL — ABNORMAL HIGH (ref 80.0–100.0)
Monocytes Absolute: 0.3 10*3/uL (ref 0.1–1.0)
Monocytes Relative: 5 %
Neutro Abs: 4 10*3/uL (ref 1.7–7.7)
Neutrophils Relative %: 78 %
Platelets: 199 10*3/uL (ref 150–400)
RBC: 2.56 MIL/uL — ABNORMAL LOW (ref 3.87–5.11)
RDW: 13.7 % (ref 11.5–15.5)
WBC: 5.1 10*3/uL (ref 4.0–10.5)
nRBC: 0 % (ref 0.0–0.2)

## 2023-08-30 LAB — COMPREHENSIVE METABOLIC PANEL
ALT: 9 U/L (ref 0–44)
AST: 19 U/L (ref 15–41)
Albumin: 2.4 g/dL — ABNORMAL LOW (ref 3.5–5.0)
Alkaline Phosphatase: 62 U/L (ref 38–126)
Anion gap: 4 — ABNORMAL LOW (ref 5–15)
BUN: 17 mg/dL (ref 8–23)
CO2: 22 mmol/L (ref 22–32)
Calcium: 7.8 mg/dL — ABNORMAL LOW (ref 8.9–10.3)
Chloride: 105 mmol/L (ref 98–111)
Creatinine, Ser: 0.55 mg/dL (ref 0.44–1.00)
GFR, Estimated: >60 mL/min (ref >60)
Glucose, Bld: 98 mg/dL (ref 70–99)
Potassium: 4 mmol/L (ref 3.5–5.1)
Sodium: 131 mmol/L — ABNORMAL LOW (ref 135–145)
Total Bilirubin: 0.8 mg/dL (ref <1.2)
Total Protein: 5 g/dL — ABNORMAL LOW (ref 6.5–8.1)

## 2023-08-30 MED ORDER — DICLOFENAC SODIUM 1 % EX GEL
4.0000 g | Freq: Four times a day (QID) | CUTANEOUS | 0 refills | Status: DC
  Filled 2023-08-30: qty 50, 4d supply, fill #0

## 2023-08-30 MED ORDER — DEXAMETHASONE 4 MG PO TABS
4.0000 mg | ORAL_TABLET | Freq: Two times a day (BID) | ORAL | 0 refills | Status: DC
  Filled 2023-08-30 (×2): qty 60, 30d supply, fill #0

## 2023-08-30 MED ORDER — B COMPLEX-C PO TABS
1.0000 | ORAL_TABLET | Freq: Every day | ORAL | 0 refills | Status: DC
  Filled 2023-08-30: qty 30, 30d supply, fill #0

## 2023-08-30 MED ORDER — PANTOPRAZOLE SODIUM 40 MG PO TBEC
40.0000 mg | DELAYED_RELEASE_TABLET | Freq: Two times a day (BID) | ORAL | 0 refills | Status: DC
  Filled 2023-08-30: qty 60, 30d supply, fill #0

## 2023-08-30 MED ORDER — VITAMIN D3 25 MCG PO TABS
1000.0000 [IU] | ORAL_TABLET | Freq: Every day | ORAL | 0 refills | Status: DC
  Filled 2023-08-30: qty 30, 30d supply, fill #0

## 2023-08-30 MED ORDER — OXYCODONE HCL 5 MG PO TABS
5.0000 mg | ORAL_TABLET | ORAL | 0 refills | Status: DC | PRN
  Filled 2023-08-30: qty 30, 5d supply, fill #0

## 2023-08-30 MED ORDER — METHOCARBAMOL 500 MG PO TABS
500.0000 mg | ORAL_TABLET | Freq: Four times a day (QID) | ORAL | 0 refills | Status: DC | PRN
  Filled 2023-08-30: qty 60, 15d supply, fill #0

## 2023-08-30 MED ORDER — APIXABAN 5 MG PO TABS
ORAL_TABLET | ORAL | 0 refills | Status: DC
  Filled 2023-08-30: qty 60, 29d supply, fill #0

## 2023-08-30 MED ORDER — ACETAMINOPHEN 325 MG PO TABS: 325.0000 mg | ORAL_TABLET | Freq: Four times a day (QID) | ORAL | Status: DC | PRN

## 2023-08-30 MED ORDER — SERTRALINE HCL 25 MG PO TABS
25.0000 mg | ORAL_TABLET | Freq: Every day | ORAL | 0 refills | Status: DC
  Filled 2023-08-30: qty 30, 30d supply, fill #0

## 2023-08-30 MED ORDER — DOCUSATE SODIUM 100 MG PO CAPS: 100.0000 mg | ORAL_CAPSULE | Freq: Two times a day (BID) | ORAL | Status: DC

## 2023-08-30 MED ORDER — LEVOTHYROXINE SODIUM 75 MCG PO TABS
75.0000 ug | ORAL_TABLET | Freq: Every day | ORAL | 0 refills | Status: DC
  Filled 2023-08-30: qty 30, 30d supply, fill #0

## 2023-08-30 NOTE — Progress Notes (Signed)
Inpatient Rehabilitation Discharge Medication Review by a Pharmacist  A complete drug regimen review was completed for this patient to identify any potential clinically significant medication issues.  High Risk Drug Classes Is patient taking? Indication by Medication  Antipsychotic No   Anticoagulant Yes Apixaban - DVT  Antibiotic No   Opioid Yes Oxycodone- prn pain  Antiplatelet No   Hypoglycemics/insulin No   Vasoactive Medication No   Chemotherapy No   Other Yes Cetirizine prn allergies Dexamethasone-cancer Levothyroxine - low thyroid Pantoprazole - reflux Sertraline - mood Methocarbamol - prn spasms     Type of Medication Issue Identified Description of Issue Recommendation(s)  Drug Interaction(s) (clinically significant)     Duplicate Therapy     Allergy     No Medication Administration End Date     Incorrect Dose     Additional Drug Therapy Needed     Significant med changes from prior encounter (inform family/care partners about these prior to discharge).    Other       Clinically significant medication issues were identified that warrant physician communication and completion of prescribed/recommended actions by midnight of the next day:  No  Name of provider notified for urgent issues identified:   Provider Method of Notification:     Pharmacist comments: None  Time spent performing this drug regimen review (minutes):  20 minutes  Thank you Okey Regal, PharmD

## 2023-08-30 NOTE — Progress Notes (Signed)
Occupational Therapy Discharge Summary  Patient Details  Name: Denise [name] MRN: [id number] Date of Birth: [birth year]  Date of Discharge from OT service:August 30, 2023   Patient has met 0 of 7 long term goals due to  change in medical/functional status and transition of care to in home Hospice .  Patient to discharge at [facility] Assist level at bed level.  Patient's care partner is independent to provide the necessary physical and cognitive assistance at discharge. Pt's daughter completed hands on training/education prior to DC regarding functional transfer and ADL recommendations and verbalized their readiness to assist pt at their CLOF.   Recommendation:  Patient will benefit from ongoing skilled OT services in home health setting to continue to advance functional skills in the area of BADL and Reduce care partner burden.  Equipment: No equipment provided  Reasons for discharge: change in medical status, lack of progress toward goals, treatment goals met, and discharge from hospital  Patient/family agrees with progress made and goals achieved: Yes  OT Discharge Precautions/Restrictions  Precautions Precautions: Fall Precaution Comments: active cancer wtih chemo port, monitor BP Restrictions Weight Bearing Restrictions Per Provider Order: No RLE Weight Bearing Per Provider Order: Weight bearing as tolerated ADL ADL Equipment Provided: Long-handled sponge Eating: Supervision/safety Where Assessed-Eating: Bed level, Edge of bed Grooming: Supervision/safety Where Assessed-Grooming: Edge of bed Upper Body Bathing: Supervision/safety Where Assessed-Upper Body Bathing: Edge of bed Lower Body Bathing: Minimal assistance Where Assessed-Lower Body Bathing: Bed level Upper Body Dressing: Supervision/safety Where Assessed-Upper Body Dressing: Edge of bed Lower Body Dressing: Minimal assistance Where Assessed-Lower Body Dressing: Bed level Toileting: Moderate  assistance Where Assessed-Toileting: Bed level Toilet Transfer: Minimal assistance Toilet Transfer Method: Squat pivot Toilet Transfer Equipment: Drop arm bedside commode Tub/Shower Transfer: Unable to assess Tub/Shower Transfer Method: Unable to assess Film/video editor: Minimal assistance Film/video editor Method: Administrator: Grab bars, Transfer tub bench ADL Comments: ADL level reflects pt's CLOF with decline in medical status and function; pt on rehab had completed full shower and was originally at ambulatory level. Vision Baseline Vision/History: 1 Wears glasses;4 Cataracts Patient Visual Report: Blurring of vision Vision Assessment?: Vision impaired- to be further tested in functional context Perception  Perception: Impaired Praxis Praxis: WFL Cognition Cognition Overall Cognitive Status: Within Functional Limits for tasks assessed Arousal/Alertness: Awake/alert Orientation Level: Person;Place;Situation Person: Oriented Place: Oriented Situation: Oriented Memory: Impaired Awareness: Impaired Problem Solving: Impaired Safety/Judgment: Impaired Brief Interview for Mental Status (BIMS) Repetition of Three Words (First Attempt): 3 Temporal Orientation: Year: Correct Temporal Orientation: Month: Accurate within 5 days Temporal Orientation: Day: Correct Recall: "Sock": Yes, no cue required Recall: "Blue": Yes, no cue required Recall: "Bed": Yes, no cue required BIMS Summary Score: 15 Sensation Sensation Light Touch: Appears Intact Hot/Cold: Not tested Proprioception: Appears Intact Stereognosis: Not tested Coordination Gross Motor Movements are Fluid and Coordinated: No Fine Motor Movements are Fluid and Coordinated: No Coordination and Movement Description: generalized weakness/deconditioning, pain, and fatigue limiting mobility. Finger Nose Finger Test: BUE weakness limiting Motor  Motor Motor: Abnormal postural alignment  and control Motor - Skilled Clinical Observations: generalized weakness/deconditioning, pain, and fatigue limiting mobility. Mobility  Bed Mobility Bed Mobility: Rolling Right;Rolling Left;Sit to Supine;Supine to Sit Rolling Right: Supervision/verbal cueing Rolling Left: Supervision/Verbal cueing Supine to Sit: Moderate Assistance - Patient 50-74% Sit to Supine: Moderate Assistance - Patient 50-74%  Trunk/Postural Assessment  Cervical Assessment Cervical Assessment: Exceptions to [facility] (forward head) Thoracic Assessment Thoracic Assessment: Exceptions to [facility] (kyphosis) Lumbar Assessment  Lumbar Assessment: Exceptions to [facility] (posterior pelvic tilt) Postural Control Postural Control: Deficits on evaluation Trunk Control: anterior and R lateral lean with increasing fatigue/weakness - requiring up to mod A for trunk control Protective Responses: delayed and inadequate with increasing weakness/fatigue  Balance Balance Balance Assessed: Yes Static Sitting Balance Static Sitting - Balance Support: Feet supported;Bilateral upper extremity supported Static Sitting - Level of Assistance: 5: Stand by assistance (supervision) Dynamic Sitting Balance Dynamic Sitting - Balance Support: Feet supported;Bilateral upper extremity supported Dynamic Sitting - Level of Assistance: 3: Mod assist Sitting balance - Comments: Pt can require up to mod A for static and dynamic sitting balance depending on weakness/fatigue, but has been able to maintain with as little as close supervision Extremity/Trunk Assessment RUE Assessment RUE Assessment: Exceptions to [facility] Active Range of Motion (AROM) Comments: WFL General Strength Comments: 3+/5 LUE Assessment LUE Assessment: Exceptions to [facility] Active Range of Motion (AROM) Comments: [facility] General Strength Comments: 3+/5   Denise [name] Denise Noralyn Karim, MS, OTR/L  08/31/2023, 3:59 PM

## 2023-08-30 NOTE — Plan of Care (Signed)
  Problem: RH Swallowing Goal: LTG Patient will participate in dysphagia therapy to increase swallow function with assistance (SLP) Description: LTG:  Patient will participate in dysphagia therapy to increase swallow function with assistance (SLP) Outcome: Completed/Met

## 2023-08-30 NOTE — Progress Notes (Addendum)
Patient ID: Denise [name], female   DOB: 1951-05-20, 72 y.o.   MRN: [id number]  Met with pt and daughter to discuss plan for discharge tomorrow. Both feel ready and have spoken with [facility] hospice liaison. [employer] to deliver DME tomorrow between 10-12 and will schedule ambulance at 11:00 knowing it will be late and then hopefully will be here around 12-1;00. PA may need to call daughter since will stay home tomorrow to wait for equipment.  11;15 AM PTAR set up for 11:30 pick up tomorrow to go home

## 2023-08-30 NOTE — Progress Notes (Signed)
Physical Therapy Discharge Summary  Patient Details  Name: Denise [name] MRN: [id number] Date of Birth: 11-08-1950  Date of Discharge from PT service:August 30, 2023  Patient has met 0 of 7 long term goals. Patient to discharge at a wheelchair level Mod Assist. Patient's care partner is independent to provide the necessary physical assistance at discharge. Pt's daughter attended family education training on 12/14 and practiced with squat<>pivot transfers. Daughter attended education again on 12/16 and verbalized confidence with squat<>pivot transfers (pt politely declined to practice at this date due to fatigue). Pt/daughter both verbalized confidence with daughter's ability to care for pt upon discharge.   Reasons goals not met: pt experienced functional decline due to multiple complications associated with poor medical prognosis.   Recommendation:  Patient will benefit from ongoing skilled PT services in home health setting to continue to advance safe functional mobility, address ongoing impairments in transfers, generalized strengthening and endurance, standing balance, ambulation, and to minimize fall risk.  Equipment: 18x16 manual WC, hospital bed  Reasons for discharge: change in medical status and lack of progress toward goals  Patient/family agrees with progress made and goals achieved: Yes  PT Discharge Precautions/Restrictions Precautions Precautions: Fall Precaution Comments: active cancer wtih chemo port, monitor BP Restrictions Weight Bearing Restrictions Per Provider Order: No RLE Weight Bearing Per Provider Order: Weight bearing as tolerated Pain Interference Pain Interference Pain Effect on Sleep: 2. Occasionally Pain Interference with Therapy Activities: 4. Almost constantly Pain Interference with Day-to-Day Activities: 4. Almost constantly Cognition Overall Cognitive Status: Within Functional Limits for tasks assessed Arousal/Alertness:  Awake/alert Orientation Level: Oriented X4 Memory: Impaired Awareness: Impaired Problem Solving: Impaired Safety/Judgment: Impaired Sensation Sensation Light Touch: Appears Intact Hot/Cold: Not tested Proprioception: Appears Intact Stereognosis: Not tested Coordination Gross Motor Movements are Fluid and Coordinated: No Fine Motor Movements are Fluid and Coordinated: Not tested Coordination and Movement Description: generalized weakness/deconditioning, pain, and fatigue limiting mobility. Motor  Motor Motor: Abnormal postural alignment and control Motor - Skilled Clinical Observations: generalized weakness/deconditioning, pain, and fatigue limiting mobility.  Mobility Bed Mobility Bed Mobility: Rolling Right;Rolling Left;Sit to Supine;Supine to Sit Rolling Right: Supervision/verbal cueing Rolling Left: Supervision/Verbal cueing Supine to Sit: Moderate Assistance - Patient 50-74% Sit to Supine: Moderate Assistance - Patient 50-74% Transfers Transfers: Lobbyist Pivot Transfers: Moderate Assistance - Patient 50-74% Locomotion  Gait Ambulation: No Gait Gait: No Stairs / Additional Locomotion Stairs: No Pick up small object from the floor (from standing position) activity did not occur: Safety/medical concerns (weakness/fatigue/pain) Wheelchair Mobility Wheelchair Mobility: No  Trunk/Postural Assessment  Cervical Assessment Cervical Assessment: Exceptions to [facility] (forward head) Thoracic Assessment Thoracic Assessment: Exceptions to [facility] (kyphosis) Lumbar Assessment Lumbar Assessment: Exceptions to [facility] (posterior pelvic tilt) Postural Control Postural Control: Deficits on evaluation Trunk Control: anterior and R lateral lean with increasing fatigue/weakness - requiring up to mod A for trunk control Protective Responses: delayed and inadequate with increasing weakness/fatigue  Balance Balance Balance Assessed: Yes Static Sitting Balance Static Sitting  - Balance Support: Feet supported;Bilateral upper extremity supported Static Sitting - Level of Assistance: 5: Stand by assistance (supervision) Dynamic Sitting Balance Dynamic Sitting - Balance Support: Feet supported;Bilateral upper extremity supported Dynamic Sitting - Level of Assistance: 3: Mod assist Sitting balance - Comments: Pt can require up to mod A for static and dynamic sitting balance depending on weakness/fatigue, but has been able to maintain with as little as close supervision Extremity Assessment  RLE Assessment RLE Assessment: Exceptions to [facility] General Strength Comments: tested in  supine due to fatigue/weakness RLE Strength Right Hip Flexion: 3+/5 Right Hip ABduction: 3+/5 Right Hip ADduction: 3+/5 Right Knee Extension: 3+/5 Right Ankle Dorsiflexion: 3+/5 Right Ankle Plantar Flexion: 3+/5 LLE Assessment LLE Assessment: Exceptions to [facility] General Strength Comments: tested in supine due to fatigue/weakness LLE Strength Left Hip Flexion: 3+/5 Left Hip ABduction: 3+/5 Left Hip ADduction: 3+/5 Left Knee Extension: 3+/5 Left Ankle Dorsiflexion: 4-/5 Left Ankle Plantar Flexion: 4-/5   [name] PT, DPT 08/30/2023, 12:28 PM

## 2023-08-30 NOTE — Progress Notes (Signed)
Occupational Therapy Session Note  Patient Details  Name: Denise [name] MRN: [id number] Date of Birth: September 19, 1950  Today's Date: 08/30/2023 OT Individual Time: 1305-1330 OT Individual Time Calculation (min): 25 min    Short Term Goals: Week 1:  OT Short Term Goal 1 (Week 1): Pt will complete sit > stand in prep for ADL with CGA using LRAD OT Short Term Goal 2 (Week 1): Pt will thread LB clothing with AE PRN, with supervision OT Short Term Goal 3 (Week 1): Pt will complete toilet transfer with CGA using LRAD OT Short Term Goal 4 (Week 1): Pt will improve endurance to sitting EOB/sink for at least 10 mins to promote ability to complete bathing at shower level  Skilled Therapeutic Interventions/Progress Updates:  Skilled OT intervention completed with focus on DC planning, family education with pt's daughter. Pt received upright in bed, agreeable to session. No pain reported.  Discussed with pt and pt's daughter about plan for DC with in home hospice. We reviewed bed level or EOB ADLs including toileting with recommendation of bed pan due to medical decline/symptoms when OOB and pt's high fall risk with toilet transfers especially due to limited time to train in last session on squat pivot > DABSC. Pt's daughter practiced with bed level placement of bed pan with pt able to roll > Rt side and bridge with supervision using bed features, with appropriate placement of bed pan. Discussed skin integrity considerations and how to manage peri hygiene and LB care/dressing to maximize pt independence. Discussed quality of life and ways to engage in meaningful occupations despite bed bound and hospice level of care.  +2 needed to boost towards [facility] via chuck pad with education provided on options for purchase of those and incontinence briefs as well. Pt remained semi upright in bed, with bed alarm on/activated, and with all needs in reach at end of session.   Therapy Documentation Precautions:   Precautions Precautions: Fall Precaution Comments: active cancer wtih chemo port, monitor BP Restrictions Weight Bearing Restrictions Per Provider Order: No RLE Weight Bearing Per Provider Order: Weight bearing as tolerated    Therapy/Group: Individual Therapy  Melvyn Novas, MS, OTR/L  08/30/2023, 1:36 PM

## 2023-08-30 NOTE — Progress Notes (Signed)
Physical Therapy Session Note  Patient Details  Name: Denise [name] MRN: [id number] Date of Birth: 1951-08-27  Today's Date: 08/30/2023 PT Individual Time: 3474-2595 PT Individual Time Calculation (min): 42 min   Short Term Goals: Week 1:  PT Short Term Goal 1 (Week 1): pt will transfer sit<>stand with LRAD and CGA PT Short Term Goal 2 (Week 1): pt will transfer bed<>chair with LRAD and CGA PT Short Term Goal 3 (Week 1): pt will ambulate 55ft with LRAD and CGA  Skilled Therapeutic Interventions/Progress Updates:   Received pt semi-reclined in bed, pt agreeable to PT treatment, and denied any pain at rest. Pt reported feeling fatigued/weak and overall "not well" and ultimately declining OOB mobility, despite therapist recommending practicing transfers. Session with emphasis on discharge planning, family education, and DME. Pt requesting to discharge home tomorrow and daughter arrived shortly after and also requesting to D/C tomorrow. Pt will receive hospital bed and WC and plans on remaining mostly bedbound with transfers to/from [facility] only. Pt declined hands on practice with bed<>chair transfers due to weakness/fatigue but daughter verbalized confidence with squat<>pivots after practicing over the weekend. Verbally talked through transfer set up and WC parts management (donning/doffing legrests/armrests, locking and unlocking brakes) and daughter appeared to have good understanding of safety. Recommended ambulance transport home tomorrow due to weakness and pt/daughter in agreement. Treatment team notified of pt/daughter's request and concluded session with pt semi-reclined in bed, needs within reach, and bed alarm on. CSW at bedside discussing discharge plan with pt/family.   Therapy Documentation Precautions:  Precautions Precautions: Fall Precaution Comments: active cancer wtih chemo port, monitor BP Restrictions Weight Bearing Restrictions Per Provider Order: No RLE Weight Bearing Per  Provider Order: Weight bearing as tolerated  Therapy/Group: Individual Therapy [name] PT, DPT 08/30/2023, 7:02 AM

## 2023-08-30 NOTE — Progress Notes (Signed)
PROGRESS NOTE   Subjective/Complaints: No new complaints this morning Discussed plan for d/c tomorrow- patient and daughter are on board Patient is being encouraged to drink fluids    ROS: +lightheadedness- better, +nausea- better, +somnolence, +constipation- improved   Pt denies SOB, abd pain, CP, N/V/C/D, and vision changes  Objective:   No results found.  Recent Labs    08/30/23 0213  WBC 5.1  HGB 9.0*  HCT 27.1*  PLT 199    Recent Labs    08/27/23 1512 08/30/23 0213  NA 131* 131*  K 4.2 4.0  CL 103 105  CO2 20* 22  GLUCOSE 94 98  BUN 21 17  CREATININE 0.97 0.55  CALCIUM 7.7* 7.8*     Intake/Output Summary (Last 24 hours) at 08/30/2023 1040 Last data filed at 08/30/2023 0600 Gross per 24 hour  Intake 270 ml  Output 524 ml  Net -254 ml        Physical Exam: Vital Signs Blood pressure 108/69, pulse 66, temperature 98.1 F (36.7 C), resp. rate 18, SpO2 99%. Skin:    Comments: Right hip incision dressed.  Appropriately tender  Neurological:     Comments: Patient is alert and follows commands, but more fatigued and with delayed processing today, stable 12/16   General: awake, alert, appropriate, sitting up in bed- but not at 90 degrees; nursing in room for part of visit; NAD HENT: conjugate gaze; oropharynx moist CV: regular rate and rhythm; no JVD Pulmonary: CTA B/L; no W/R/R- good air movement GI: soft, NT, ND, (+)BS Psychiatric: appropriate Neurological: Ox3- able to tell us about her swallowing difficulties and asking appropriate questions- still delayed responses   Skin: No evidence of breakdown, no evidence of rash Neurologic: mildly delayed responses with mild dysarthria motor strength is 5/5 in bilateral deltoid, bicep, tricep, grip, hip flexor, 3/5 in lower extremities Sensory exam normal sensation to light touch  in bilateral upper and lower extremities, stable 12/13    Musculoskeletal: Full range of motion in BUE No joint swelling Post op Right thigh edema with ecchymosis posterior to hip incisions Fullness and mild tenderness Right popliteal fossa     Assessment/Plan: 1. Functional deficits which require 3+ hours per day of interdisciplinary therapy in a comprehensive inpatient rehab setting. Physiatrist is providing close team supervision and 24 hour management of active medical problems listed below. Physiatrist and rehab team continue to assess barriers to discharge/monitor patient progress toward functional and medical goals  Care Tool:  Bathing    Body parts bathed by patient: Right arm, Left arm, Chest, Abdomen, Front perineal area, Buttocks, Right upper leg, Left upper leg, Face   Body parts bathed by helper: Right lower leg, Left lower leg     Bathing assist Assist Level: Minimal Assistance - Patient > 75%     Upper Body Dressing/Undressing Upper body dressing   What is the patient wearing?: Pull over shirt    Upper body assist Assist Level: Supervision/Verbal cueing    Lower Body Dressing/Undressing Lower body dressing      What is the patient wearing?: Pants     Lower body assist Assist for lower body dressing: Minimal Assistance - Patient >  75%     Toileting Toileting    Toileting assist Assist for toileting: Moderate Assistance - Patient 50 - 74%     Transfers Chair/bed transfer  Transfers assist     Chair/bed transfer assist level: Moderate Assistance - Patient 50 - 74%     Locomotion Ambulation   Ambulation assist   Ambulation activity did not occur: Safety/medical concerns (weakness/fatigue/pain)  Assist level: Minimal Assistance - Patient > 75% Assistive device: Walker-rolling Max distance: 46ft   Walk 10 feet activity   Assist  Walk 10 feet activity did not occur: Safety/medical concerns (weakness/fatigue/pain)  Assist level: Minimal Assistance - Patient > 75% Assistive device:  Walker-rolling   Walk 50 feet activity   Assist Walk 50 feet with 2 turns activity did not occur: Safety/medical concerns (weakness/fatigue/pain)         Walk 150 feet activity   Assist Walk 150 feet activity did not occur: Safety/medical concerns (weakness/fatigue/pain)         Walk 10 feet on uneven surface  activity   Assist Walk 10 feet on uneven surfaces activity did not occur: Safety/medical concerns (weakness/fatigue/pain)         Wheelchair     Assist Is the patient using a wheelchair?: Yes Type of Wheelchair: Manual    Wheelchair assist level: Dependent - Patient 0% Max wheelchair distance: >324ft    Wheelchair 50 feet with 2 turns activity    Assist        Assist Level: Dependent - Patient 0%   Wheelchair 150 feet activity     Assist      Assist Level: Dependent - Patient 0%   Blood pressure 108/69, pulse 66, temperature 98.1 F (36.7 C), resp. rate 18, SpO2 99%.  Medical Problem List and Plan: 1. Functional deficits secondary to metastatic breast cancer with metastasis to right femur.  Status post prophylactic intramedullary nail of right femur for right femur lytic lesions 08/20/2023.  Weightbearing as tolerated             -patient may  shower starting 12/10             -ELOS/Goals: 10-12d  D3 ordered  Con't CIR PT and OT  Discussed home by ambulance tomorrow with home hospice 2.  Multiple DVTs: Eliquis started 3. Headache: Fioricet ordered x1, topamax ordered to replace gabapentin HS, d/ced as headaches have resolved.  4. Mood/Behavior/Sleep: Provide emotional support             -antipsychotic agents: N/A 5. Neuropsych/cognition: This patient is capable of making decisions on her own behalf. 6. Skin/Wound Care: Routine skin checks 7. Fluids/Electrolytes/Nutrition: Routine in and outs with follow-up chemistries  8.  Acute blood loss anemia.  Follow-up CBC. CT abdomen and stool occult ordered, CT abdomen reviewed and is  negative for bleed  12/14- Hb 8.5- has been dwindling- 12/15- labs in AM  9.  Hypothyroidism.  Continue Synthroid  10.  Orthostasis:  D/c prn anti-hypertensives, teds ordered   11.  Peripheral neuropathy due to chemotherapy    12.  Macrocytic anemia: B12 reviewed and elevated, will add oral B complex  13.  Hx metastatic brain lesion s/Denise radiation therapy Oct 2023. Palliative care consulted  14.  Dysphagia like due to radiation therapy D3 diet  12/15- wasn't on D3 diet- changed back- since cannot tolerate regular diet- found dentures- also not drinking well.  Per nurse- very little- pt says she vomits up food, not digested when eats something not soft and  sometimes liquids.  15. Nausea: resolved, d/c scopolamine patch, discussed that this is likely 2/2 constipation  16. Pain from IV: placed order for IV removal  17. Constipation: improved, continue current regimen  18. AMS: CT Head ordered and is stable, improved  19. Poor intake, poor output  Cr reviewed and within normal limits      LOS: 7 days A FACE TO FACE EVALUATION WAS PERFORMED  Denise [name] Denise [name] 08/30/2023, 10:40 AM

## 2023-08-30 NOTE — Progress Notes (Signed)
Speech Language Pathology Discharge Summary  Patient Details  Name: Denise [name] MRN: [id number] Date of Birth: 1950/11/18  Date of Discharge from SLP service:August 30, 2023  Today's Date: 08/30/2023 SLP Individual Time: 8657-8469 SLP Individual Time Calculation (min): 23 min  Skilled Therapeutic Interventions:  SLP conducted skilled therapy session targeting dysphagia management goals. SLP and patient discussed patient's swallowing deficits, which patient reports have been present for several months. Patient independently states compensatory techniques, diet modifications, and swallowing strategies to improve comfort and access to preferred food items. Patient is independent for all swallowing needs and has met all speech therapy goals. See full discharge summary below.   Patient has met 1 of 1 long term goals.  Patient to discharge at overall Independent level.  Reasons goals not met: n/a   Clinical Impression/Discharge Summary: Patient has made excellent gains, meeting 1/1 long term goal set for admission and returning to baseline level of function. Patient independently states compensatory techniques, diet modifications, and swallowing strategies to improve comfort and access to preferred food items. No further SLP services indicated at next venue of care. Patient/family education complete. SLP will sign off.   Care Partner:  Caregiver Able to Provide Assistance: Other (comment) (none needed)    Recommendation:  None     Equipment: n/a   Reasons for discharge: Treatment goals met;Discharged from hospital   Patient/Family Agrees with Progress Made and Goals Achieved: Yes   Jeannie Done, M.A., CCC-SLP   Yetta Barre 08/30/2023, 4:28 PM

## 2023-08-31 DIAGNOSIS — I951 Orthostatic hypotension: Secondary | ICD-10-CM | POA: Diagnosis not present

## 2023-08-31 DIAGNOSIS — D62 Acute posthemorrhagic anemia: Secondary | ICD-10-CM | POA: Diagnosis not present

## 2023-08-31 DIAGNOSIS — M899 Disorder of bone, unspecified: Secondary | ICD-10-CM | POA: Diagnosis not present

## 2023-08-31 DIAGNOSIS — K5900 Constipation, unspecified: Secondary | ICD-10-CM | POA: Diagnosis not present

## 2023-08-31 NOTE — Progress Notes (Signed)
PROGRESS NOTE   Subjective/Complaints: Patient looking forward to discharge home today.  Reports she continues to have pain on the right leg, reports tolerable with current medications.   ROS: +lightheadedness- better, +nausea- better, +somnolence, +constipation- improved +headache   Pt denies SOB, abd pain, CP, N/V/C/D, and vision changes  Objective:   No results found.  Recent Labs    08/30/23 0213  WBC 5.1  HGB 9.0*  HCT 27.1*  PLT 199    Recent Labs    08/30/23 0213  NA 131*  K 4.0  CL 105  CO2 22  GLUCOSE 98  BUN 17  CREATININE 0.55  CALCIUM 7.8*     Intake/Output Summary (Last 24 hours) at 08/31/2023 1255 Last data filed at 08/31/2023 0749 Gross per 24 hour  Intake 120 ml  Output 274 ml  Net -154 ml        Physical Exam: Vital Signs Blood pressure 110/77, pulse 68, temperature 98.2 F (36.8 C), temperature source Oral, resp. rate 17, SpO2 99%.   General: awake, alert, appropriate, NAD  HENT: conjugate gaze; oropharynx moist CV: regular rate and rhythm; no JVD Pulmonary: CTA B/L;  nonlabored breathing GI: soft, NT, ND, (+)BS Psychiatric: appropriate, cooperative Neurological: Ox3- appropriate questions- still delayed responses   Skin: No evidence of breakdown, no evidence of rash Neurologic: mildly delayed responses with mild dysarthria motor strength is 5/5 in bilateral deltoid, bicep, tricep, grip, hip flexor, 3/5 in lower extremities Sensory exam normal sensation to light touch  in bilateral upper and lower extremities, stable 12/17   Musculoskeletal: Full range of motion in BUE No joint swelling Post op Right thigh edema with ecchymosis posterior to hip incisions Fullness and mild tenderness Right popliteal fossa     Assessment/Plan: 1. Functional deficits which require 3+ hours per day of interdisciplinary therapy in a comprehensive inpatient rehab setting. Physiatrist is  providing close team supervision and 24 hour management of active medical problems listed below. Physiatrist and rehab team continue to assess barriers to discharge/monitor patient progress toward functional and medical goals  Care Tool:  Bathing    Body parts bathed by patient: Right arm, Left arm, Chest, Abdomen, Front perineal area, Buttocks, Right upper leg, Left upper leg, Face   Body parts bathed by helper: Right lower leg, Left lower leg     Bathing assist Assist Level: Minimal Assistance - Patient > 75%     Upper Body Dressing/Undressing Upper body dressing   What is the patient wearing?: Pull over shirt    Upper body assist Assist Level: Supervision/Verbal cueing    Lower Body Dressing/Undressing Lower body dressing      What is the patient wearing?: Pants, Incontinence brief     Lower body assist Assist for lower body dressing: Minimal Assistance - Patient > 75%     Toileting Toileting    Toileting assist Assist for toileting: Minimal Assistance - Patient > 75%     Transfers Chair/bed transfer  Transfers assist     Chair/bed transfer assist level: Moderate Assistance - Patient 50 - 74% (squat<>pivot)     Locomotion Ambulation   Ambulation assist   Ambulation activity did not occur: Safety/medical concerns (  pain, fatigue, illness)  Assist level: Minimal Assistance - Patient > 75% Assistive device: Walker-rolling Max distance: 42ft   Walk 10 feet activity   Assist  Walk 10 feet activity did not occur: Safety/medical concerns (pain, fatigue, illness)  Assist level: Minimal Assistance - Patient > 75% Assistive device: Walker-rolling   Walk 50 feet activity   Assist Walk 50 feet with 2 turns activity did not occur: Safety/medical concerns (pain, fatigue, illness)         Walk 150 feet activity   Assist Walk 150 feet activity did not occur: Safety/medical concerns (pain, fatigue, illness)         Walk 10 feet on uneven surface   activity   Assist Walk 10 feet on uneven surfaces activity did not occur: Safety/medical concerns (pain, fatigue, illness)         Wheelchair     Assist Is the patient using a wheelchair?: Yes Type of Wheelchair: Manual    Wheelchair assist level: Dependent - Patient 0% Max wheelchair distance: >337ft    Wheelchair 50 feet with 2 turns activity    Assist        Assist Level: Dependent - Patient 0%   Wheelchair 150 feet activity     Assist      Assist Level: Dependent - Patient 0%   Blood pressure 110/77, pulse 68, temperature 98.2 F (36.8 C), temperature source Oral, resp. rate 17, SpO2 99%.  Medical Problem List and Plan: 1. Functional deficits secondary to metastatic breast cancer with metastasis to right femur.  Status post prophylactic intramedullary nail of right femur for right femur lytic lesions 08/20/2023.  Weightbearing as tolerated             -patient may  shower starting 12/10             -ELOS/Goals: 10-12d  D3 ordered  Con't CIR PT and OT  Discussed home by ambulance tomorrow with home hospice  -OK for DC home today with home hospice 2.  Multiple DVTs: Eliquis started 3. Headache: Fioricet ordered x1, topamax ordered to replace gabapentin HS, d/ced as headaches have resolved.  4. Mood/Behavior/Sleep: Provide emotional support             -antipsychotic agents: N/A 5. Neuropsych/cognition: This patient is capable of making decisions on her own behalf. 6. Skin/Wound Care: Routine skin checks 7. Fluids/Electrolytes/Nutrition: Routine in and outs with follow-up chemistries  8.  Acute blood loss anemia.  Follow-up CBC. CT abdomen and stool occult ordered, CT abdomen reviewed and is negative for bleed  12/14- Hb 8.5- has been dwindling- 12/17 HGB stable 9.0 yesterday  9.  Hypothyroidism.  Continue Synthroid  10.  Orthostasis:  D/c prn anti-hypertensives, teds ordered  BP stable     08/31/2023    7:49 AM 08/30/2023    8:57 PM  08/30/2023   12:38 PM  Vitals with BMI  Systolic 110 109 94  Diastolic 77 67 70  Pulse 68 62 68      11.  Peripheral neuropathy due to chemotherapy    12.  Macrocytic anemia: B12 reviewed and elevated, will add oral B complex  13.  Hx metastatic brain lesion s/p radiation therapy Oct 2023. Palliative care consulted  14.  Dysphagia like due to radiation therapy D3 diet  12/15- wasn't on D3 diet- changed back- since cannot tolerate regular diet- found dentures- also not drinking well.  Per nurse- very little- pt says she vomits up food, not digested when eats something  not soft and sometimes liquids.  15. Nausea: resolved, d/c scopolamine patch, discussed that this is likely 2/2 constipation  16. Pain from IV: placed order for IV removal  17. Constipation: improved, continue current regimen  -12/17 LBM today imrpoved  18. AMS: CT Head ordered and is stable, improved  19. Poor intake, poor output  Cr reviewed and within normal limits      LOS: 8 days A FACE TO FACE EVALUATION WAS PERFORMED  Fanny Dance 08/31/2023, 12:55 PM

## 2023-08-31 NOTE — Progress Notes (Signed)
Patient and daughter given dc instruction yesterday. Patient received medications from TOC. Patient dc'd home via EMS.    Tilden Dome, LPN

## 2023-08-31 NOTE — Progress Notes (Signed)
Inpatient Rehabilitation Care Coordinator Discharge Note   Patient Details  Name: Denise [name] MRN: [id number] Date of Birth: [birth year]   Discharge location: Home with [facility])  Length of Stay: 8 Days  Discharge activity level: Sup  Home/community participation: Daughter  Patient response NW:[id number] Literacy - How often do you need to have someone help you when you read instructions, pamphlets, or other written material from your doctor or pharmacy?: Rarely  Patient response [id number] Isolation - How often do you feel lonely or isolated from those around you?: Never  Services provided included: MD, RD, PT, OT, SLP, RN, CM, TR, Pharmacy, Neuropsych, SW  Financial Services:  Financial Services Utilized: Medicare    Choices offered to/list presented to: Pt and daughter  Follow-up services arranged:  Other (Comment) ([facility])           Patient response to transportation need: Is the patient able to respond to transportation needs?: Yes In the past 12 months, has lack of transportation kept you from medical appointments or from getting medications?: No In the past 12 months, has lack of transportation kept you from meetings, work, or from getting things needed for daily living?: No   Patient/Family verbalized understanding of follow-up arrangements:  Yes  Individual responsible for coordination of the follow-up plan: self  Confirmed correct DME delivered: Andria Rhein 08/31/2023    Comments (or additional information):  Summary of Stay    Date/Time Discharge Planning CSW  08/24/23 1620 Patient plans to d/c home at MOD I level. Pt reports daughter able to provide 24/7 supervision, if needed. 1 level, level entry. Has: RW, rollator, shower seat and SPC. CJB       Andria Rhein

## 2023-08-31 NOTE — Plan of Care (Signed)
Goals not met due to change in medical status, with pt's POC transitioning to in home hospice and was unable to achieve mod I level goals for DC alone.  Problem: Sit to Stand Goal: LTG:  Patient will perform sit to stand in prep for activites of daily living with assistance level (OT) Description: LTG:  Patient will perform sit to stand in prep for activites of daily living with assistance level (OT) Outcome: Not Met (add Reason)   Problem: RH Bathing Goal: LTG Patient will bathe all body parts with assist levels (OT) Description: LTG: Patient will bathe all body parts with assist levels (OT) Outcome: Not Met (add Reason)   Problem: RH Dressing Goal: LTG Patient will perform lower body dressing w/assist (OT) Description: LTG: Patient will perform lower body dressing with assist, with/without cues in positioning using equipment (OT) Outcome: Not Met (add Reason)   Problem: RH Toileting Goal: LTG Patient will perform toileting task (3/3 steps) with assistance level (OT) Description: LTG: Patient will perform toileting task (3/3 steps) with assistance level (OT)  Outcome: Not Met (add Reason)   Problem: RH Simple Meal Prep Goal: LTG Patient will perform simple meal prep w/assist (OT) Description: LTG: Patient will perform simple meal prep with assistance, with/without cues (OT). Outcome: Not Met (add Reason)   Problem: RH Toilet Transfers Goal: LTG Patient will perform toilet transfers w/assist (OT) Description: LTG: Patient will perform toilet transfers with assist, with/without cues using equipment (OT) Outcome: Not Met (add Reason)   Problem: RH Tub/Shower Transfers Goal: LTG Patient will perform tub/shower transfers w/assist (OT) Description: LTG: Patient will perform tub/shower transfers with assist, with/without cues using equipment (OT) Outcome: Not Met (add Reason)

## 2023-09-01 DIAGNOSIS — I10 Essential (primary) hypertension: Secondary | ICD-10-CM | POA: Diagnosis not present

## 2023-09-01 DIAGNOSIS — G629 Polyneuropathy, unspecified: Secondary | ICD-10-CM | POA: Diagnosis not present

## 2023-09-01 DIAGNOSIS — D62 Acute posthemorrhagic anemia: Secondary | ICD-10-CM | POA: Diagnosis not present

## 2023-09-01 DIAGNOSIS — R1310 Dysphagia, unspecified: Secondary | ICD-10-CM | POA: Diagnosis not present

## 2023-09-01 DIAGNOSIS — C50912 Malignant neoplasm of unspecified site of left female breast: Secondary | ICD-10-CM | POA: Diagnosis not present

## 2023-09-01 DIAGNOSIS — I82401 Acute embolism and thrombosis of unspecified deep veins of right lower extremity: Secondary | ICD-10-CM | POA: Diagnosis not present

## 2023-09-01 DIAGNOSIS — R339 Retention of urine, unspecified: Secondary | ICD-10-CM | POA: Diagnosis not present

## 2023-09-01 DIAGNOSIS — C4021 Malignant neoplasm of long bones of right lower limb: Secondary | ICD-10-CM | POA: Diagnosis not present

## 2023-09-01 DIAGNOSIS — C7931 Secondary malignant neoplasm of brain: Secondary | ICD-10-CM | POA: Diagnosis not present

## 2023-09-01 DIAGNOSIS — E039 Hypothyroidism, unspecified: Secondary | ICD-10-CM | POA: Diagnosis not present

## 2023-09-01 DIAGNOSIS — J302 Other seasonal allergic rhinitis: Secondary | ICD-10-CM | POA: Diagnosis not present

## 2023-09-02 DIAGNOSIS — C7931 Secondary malignant neoplasm of brain: Secondary | ICD-10-CM | POA: Diagnosis not present

## 2023-09-02 DIAGNOSIS — I10 Essential (primary) hypertension: Secondary | ICD-10-CM | POA: Diagnosis not present

## 2023-09-02 DIAGNOSIS — C4021 Malignant neoplasm of long bones of right lower limb: Secondary | ICD-10-CM | POA: Diagnosis not present

## 2023-09-02 DIAGNOSIS — I82401 Acute embolism and thrombosis of unspecified deep veins of right lower extremity: Secondary | ICD-10-CM | POA: Diagnosis not present

## 2023-09-02 DIAGNOSIS — D62 Acute posthemorrhagic anemia: Secondary | ICD-10-CM | POA: Diagnosis not present

## 2023-09-02 DIAGNOSIS — C50912 Malignant neoplasm of unspecified site of left female breast: Secondary | ICD-10-CM | POA: Diagnosis not present

## 2023-09-03 ENCOUNTER — Telehealth: Payer: Self-pay | Admitting: Radiation Oncology

## 2023-09-03 ENCOUNTER — Encounter: Payer: Medicare Other | Admitting: Orthopaedic Surgery

## 2023-09-03 DIAGNOSIS — C4021 Malignant neoplasm of long bones of right lower limb: Secondary | ICD-10-CM | POA: Diagnosis not present

## 2023-09-03 DIAGNOSIS — C50912 Malignant neoplasm of unspecified site of left female breast: Secondary | ICD-10-CM | POA: Diagnosis not present

## 2023-09-03 DIAGNOSIS — C7931 Secondary malignant neoplasm of brain: Secondary | ICD-10-CM | POA: Diagnosis not present

## 2023-09-03 DIAGNOSIS — D62 Acute posthemorrhagic anemia: Secondary | ICD-10-CM | POA: Diagnosis not present

## 2023-09-03 DIAGNOSIS — I82401 Acute embolism and thrombosis of unspecified deep veins of right lower extremity: Secondary | ICD-10-CM | POA: Diagnosis not present

## 2023-09-03 DIAGNOSIS — I10 Essential (primary) hypertension: Secondary | ICD-10-CM | POA: Diagnosis not present

## 2023-09-03 NOTE — Telephone Encounter (Signed)
LVM to schedule RECON with Dr. Willeen Niece.

## 2023-09-06 ENCOUNTER — Telehealth: Payer: Self-pay | Admitting: Radiation Oncology

## 2023-09-06 ENCOUNTER — Telehealth: Payer: Self-pay | Admitting: *Deleted

## 2023-09-06 ENCOUNTER — Encounter: Payer: Self-pay | Admitting: Radiation Oncology

## 2023-09-06 DIAGNOSIS — D62 Acute posthemorrhagic anemia: Secondary | ICD-10-CM | POA: Diagnosis not present

## 2023-09-06 DIAGNOSIS — C7931 Secondary malignant neoplasm of brain: Secondary | ICD-10-CM | POA: Diagnosis not present

## 2023-09-06 DIAGNOSIS — C4021 Malignant neoplasm of long bones of right lower limb: Secondary | ICD-10-CM | POA: Diagnosis not present

## 2023-09-06 DIAGNOSIS — I82401 Acute embolism and thrombosis of unspecified deep veins of right lower extremity: Secondary | ICD-10-CM | POA: Diagnosis not present

## 2023-09-06 DIAGNOSIS — C50912 Malignant neoplasm of unspecified site of left female breast: Secondary | ICD-10-CM | POA: Diagnosis not present

## 2023-09-06 DIAGNOSIS — I10 Essential (primary) hypertension: Secondary | ICD-10-CM | POA: Diagnosis not present

## 2023-09-06 NOTE — Progress Notes (Signed)
  Radiation Oncology         [phone]) [phone] ________________________________  Name: MIGNONNE REISING  NWG:[id number]  Date of Service: 09/06/23  DOB: [birth year]   Steroid Taper Instructions   You currently have a prescription for Dexamethasone 4 mg Tablets.    Beginning 09/07/23: Take 1/2 of a tablet (which is 2 mg) twice a day  Beginning 09/14/23: Take 1/2 of a tablet (which is 2 mg) once a day  Beginning 09/20/22: Take 1/2 of a tablet (which is 2 mg) every other day and stop on 09/28/23.   Please call our office if you have any headaches, visual changes, uncontrolled movements, extremity weakness, nausea or vomiting.

## 2023-09-06 NOTE — Telephone Encounter (Signed)
LVM to schedule consult with Dr. Willeen Niece.

## 2023-09-06 NOTE — Telephone Encounter (Signed)
Left a voicemail for the patient to let her know we have placed instructions for steroid taper in her mychart.  Asked to give [place] a call back with any questions or concerns, call back number [phone]) left.  Lind Covert RN, BSN

## 2023-09-07 ENCOUNTER — Telehealth: Payer: Self-pay | Admitting: *Deleted

## 2023-09-07 DIAGNOSIS — C7931 Secondary malignant neoplasm of brain: Secondary | ICD-10-CM | POA: Diagnosis not present

## 2023-09-07 DIAGNOSIS — D62 Acute posthemorrhagic anemia: Secondary | ICD-10-CM | POA: Diagnosis not present

## 2023-09-07 DIAGNOSIS — I10 Essential (primary) hypertension: Secondary | ICD-10-CM | POA: Diagnosis not present

## 2023-09-07 DIAGNOSIS — C50912 Malignant neoplasm of unspecified site of left female breast: Secondary | ICD-10-CM | POA: Diagnosis not present

## 2023-09-07 DIAGNOSIS — C4021 Malignant neoplasm of long bones of right lower limb: Secondary | ICD-10-CM | POA: Diagnosis not present

## 2023-09-07 DIAGNOSIS — I82401 Acute embolism and thrombosis of unspecified deep veins of right lower extremity: Secondary | ICD-10-CM | POA: Diagnosis not present

## 2023-09-07 NOTE — Telephone Encounter (Signed)
Left the patient a voicemail to see if she needs a refill on her steroid to last until her stop date of 09/28/2023.  Number left [phone]) for return call.  Lind Covert RN, BSN

## 2023-09-08 DIAGNOSIS — D62 Acute posthemorrhagic anemia: Secondary | ICD-10-CM | POA: Diagnosis not present

## 2023-09-08 DIAGNOSIS — I82401 Acute embolism and thrombosis of unspecified deep veins of right lower extremity: Secondary | ICD-10-CM | POA: Diagnosis not present

## 2023-09-08 DIAGNOSIS — C4021 Malignant neoplasm of long bones of right lower limb: Secondary | ICD-10-CM | POA: Diagnosis not present

## 2023-09-08 DIAGNOSIS — C7931 Secondary malignant neoplasm of brain: Secondary | ICD-10-CM | POA: Diagnosis not present

## 2023-09-08 DIAGNOSIS — C50912 Malignant neoplasm of unspecified site of left female breast: Secondary | ICD-10-CM | POA: Diagnosis not present

## 2023-09-08 DIAGNOSIS — I10 Essential (primary) hypertension: Secondary | ICD-10-CM | POA: Diagnosis not present

## 2023-09-09 ENCOUNTER — Inpatient Hospital Stay: Payer: Medicare Other

## 2023-09-09 ENCOUNTER — Inpatient Hospital Stay: Payer: Medicare Other | Admitting: Hematology

## 2023-09-09 ENCOUNTER — Telehealth: Payer: Self-pay | Admitting: Radiation Oncology

## 2023-09-09 ENCOUNTER — Telehealth: Payer: Self-pay

## 2023-09-09 ENCOUNTER — Ambulatory Visit: Payer: Medicare Other

## 2023-09-09 DIAGNOSIS — D62 Acute posthemorrhagic anemia: Secondary | ICD-10-CM | POA: Diagnosis not present

## 2023-09-09 DIAGNOSIS — C7931 Secondary malignant neoplasm of brain: Secondary | ICD-10-CM | POA: Diagnosis not present

## 2023-09-09 DIAGNOSIS — I10 Essential (primary) hypertension: Secondary | ICD-10-CM | POA: Diagnosis not present

## 2023-09-09 DIAGNOSIS — I82401 Acute embolism and thrombosis of unspecified deep veins of right lower extremity: Secondary | ICD-10-CM | POA: Diagnosis not present

## 2023-09-09 DIAGNOSIS — C4021 Malignant neoplasm of long bones of right lower limb: Secondary | ICD-10-CM | POA: Diagnosis not present

## 2023-09-09 DIAGNOSIS — C50912 Malignant neoplasm of unspecified site of left female breast: Secondary | ICD-10-CM | POA: Diagnosis not present

## 2023-09-11 ENCOUNTER — Ambulatory Visit: Payer: Medicare Other

## 2023-09-13 ENCOUNTER — Other Ambulatory Visit: Payer: Medicare Other

## 2023-09-15 NOTE — Telephone Encounter (Addendum)
Called to schedule RECON for pt with Dr. Willeen Niece. Pt's daughter answered and advised she was on hospice and passed away earlier today Sep 20, 2023.Pt's daughter was advised that I would pass along message about this to help minimize calls.

## 2023-09-15 NOTE — Progress Notes (Incomplete)
[facility] [address] [place] [address]    Clinic Day:  09/08/2023  Referring physician: Toma Deiters, MD  Patient Care Team: Toma Deiters, MD as PCP - General (Internal Medicine) [name], MD as Medical Oncologist (Medical Oncology)   ASSESSMENT & PLAN:   Assessment: 1.  T3N3c (stage IIIc) triple negative invasive lobular carcinoma of the left breast: - She felt lump in her left breast for more than 6 months, but thought it was secondary to fibrocystic disease which she had all her life.  When she started having pain, she reached out to Dr. Olena Leatherwood. - Ultrasound-guided left breast and left axillary lymph node biopsy on 01/15/2021 - Pathology consistent with invasive lobular carcinoma, E-cadherin negative.  ER/PR/HER2 negative.  HER2 2+ by IHC, negative by FISH.  Ki-67 is 5%.  Lymph node core biopsy was consistent with metastatic carcinoma.  Grade 2. - PET scan on 02/03/2021 showed involvement of left supraclavicular, subpectoral, axillary lymph nodes along with the breast mass.  10 mm left lung nodule which is hypometabolic.  Spinal cord lesion at T12 level with a strong uptake. - MRI of the lumbar spine with and without contrast on 02/20/2021 showed no mass or abnormal enhancement within the canal at the T12 level to correspond to the site of PET scan positive.  No marrow replacing bone lesion. - 12 weeks of carboplatin and paclitaxel weekly and every 3 weeks pembrolizumab followed by 4 cycles of dose dense AC with pembrolizumab ([id number]) from 02/27/2021 through 10/01/2021 - PET scan on 10/16/2021: Reduction in metabolic activity in the size of the left axillary lymph node and no evidence of breast hypermetabolism on PET scan. - Left mastectomy and lymph node excision by Dr. Magnus Ivan on 11/20/2021 - Pathology: 6.2 cm invasive lobular carcinoma, grade 2, margins negative.  19/21 lymph nodes involved with macrometastasis.  ypT3, YPN3A - Adjuvant  pembrolizumab started on 01/01/2022. -CREATE-X capecitabine 1500 mg twice daily 2 weeks on/1 week off started on 01/01/2022.  Last dose of pembrolizumab on 05/11/2022 - Germline mutation testing ([facility] [employer]) negative - [employer] (06/04/2022): T p53, K-ras V14 I, CDKN2A.  MSI-high not detected. - PET scan (05/28/2022): Multifocal bone lesions involving T11 vertebral body, right posterior sixth rib, left femoral neck, left scapular glenoid.  No other metastatic disease. - Sacituzumab cycle 1 on 08/18/2022.  She was hospitalized with neutropenic fever and severe diarrhea from 08/24/2022 through 08/27/2022. - CTAP (10/25/2021): Generalized mild to moderate wall thickening throughout the nondistended large bowel with faint pericolonic fat haziness compatible with nonspecific infectious/inflammatory colitis.  No free air.  C. difficile x 2 was negative. - She has UG T1 A1*28 heterozygosity. - Cycle 2 Sacituzumab dose reduced by 50%.  Discontinued secondary to poor tolerance. - Enhertu 3.2 Mg/KG cycle 1 on 11/04/2022 - WBRT 30 Gray in 10 fractions from 06/15/2023 through 06/29/2023   2.  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  She is non-smoker. - Mother died of breast cancer.  Maternal grandmother died very young in her 17s, sister has fibrocystic disease.  Father died of lung cancer and was a smoker.    Plan: 1.  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  Slightly increased lytic lesion of the right iliac crest with no evidence of visceral metastatic disease. - I have reviewed x-ray of  her femur which showed at least 3 lytic lesions. - Reviewed echo from 07/22/2023: LVEF 55-60%. - She reports palpitations when lying down at nighttime.  She had 2 ER visits on 07/10/2023 and 07/17/2023 for the same reason.  They have given her lidocaine patch  which helped after workup did not reveal any major problems. - Will make referral to cardiology for further evaluation. - She thinks it could be from panic attacks.  We will give a trial of Zoloft 25 mg daily. - We will also refer to orthopedics for prophylactic nailing of right femur to prevent future fractures.  I have also recommended not to lift weights more than 10 pounds. - We will check tumor markers today.  She may proceed with Enhertu today at 4.52 mg/kg.  2.  Brain lesions (SRS to 3 brain lesions on 06/16/2022): - WBRT completed on 06/29/2023.  Dexamethasone is being tapered off.  3.  Peripheral neuropathy: - Continue gabapentin 300 mg at bedtime.  4.  Hypomagnesemia: - Continue magnesium 1 tablet daily.  Magnesium is 1.8.  5.  Bone metastasis: - We talked about initiating her on zoledronic acid to prevent skeletal related events.  We discussed side effects including hypocalcemia, rare chance of osteonecrosis of the jaw.  We will schedule it every 6 weeks to align with her chemotherapy.    No orders of the defined types were placed in this encounter.     Alben Deeds Teague,acting as a Neurosurgeon for [name], MD.,have documented all relevant documentation on the behalf of [name], MD,as directed by  [name], MD while in the presence of [name], MD.  ***   Lissa Hoard R Teague   12/25/20249:19 PM  CHIEF COMPLAINT:   Diagnosis: Metastatic triple negative breast cancer to the bones    Cancer Staging  Invasive lobular carcinoma of left breast in female [facility]) Staging form: Breast, AJCC 8th Edition - Clinical stage from 01/23/2021: cT3, cN3c, G2, ER-, PR-, HER2- - Unsigned - Pathologic stage from 12/22/2021: No Stage Recommended (ypT3, pN3a, cM0, G2, ER-, PR-, HER2-) - Signed by [name], MD on 12/22/2021    Prior Therapy: 1.  Neoadjuvant chemotherapy with weekly carboplatin and paclitaxel and dose dense AC with  pembrolizumab 2. Left mastectomy and lymph node excision by Dr. Magnus Ivan on 11/20/2021 3.  Sacituzumab on 08/18/2022 and 10/08/2021, discontinued due to poor tolerance.  Current Therapy:  Enhertu every 21 days    HISTORY OF PRESENT ILLNESS:   Oncology History  Invasive lobular carcinoma of left breast in female [facility])  01/23/2021 Initial Diagnosis   Invasive lobular carcinoma of left breast in female [facility])   02/19/2021 Genetic Testing   Negative genetic testing on the CancerNext-Expanded+RNAinsight panel.  SMARCB1 VUS identified.  The CancerNext-Expanded gene panel offered by [facility] and includes sequencing and rearrangement analysis for the following 77 genes: AIP, ALK, APC*, ATM*, AXIN2, BAP1, BARD1, BLM, BMPR1A, BRCA1*, BRCA2*, BRIP1*, CDC73, CDH1*, CDK4, CDKN1B, CDKN2A, CHEK2*, CTNNA1, DICER1, FANCC, FH, FLCN, GALNT12, KIF1B, LZTR1, MAX, MEN1, MET, MLH1*, MSH2*, MSH3, MSH6*, MUTYH*, NBN, NF1*, NF2, NTHL1, PALB2*, PHOX2B, PMS2*, POT1, PRKAR1A, PTCH1, PTEN*, RAD51C*, RAD51D*, RB1, RECQL, RET, SDHA, SDHAF2, SDHB, SDHC, SDHD, SMAD4, SMARCA4, SMARCB1, SMARCE1, STK11, SUFU, TMEM127, TP53*, TSC1, TSC2, VHL and XRCC2 (sequencing and deletion/duplication); EGFR, EGLN1, HOXB13, KIT, MITF, PDGFRA, POLD1, and POLE (sequencing only); EPCAM and GREM1 (deletion/duplication only). DNA and RNA analyses performed for * genes. The report date is February 19, 2021.   02/27/2021 - 05/11/2022 Chemotherapy  Patient is on Treatment Plan : BREAST Pembrolizumab + Carboplatin D1,8,15+ Paclitaxel D1,8,15 q21d X 4 cycles / Pembrolizumab + AC q21d x 4 cycles     12/22/2021 Cancer Staging   Staging form: Breast, AJCC 8th Edition - Pathologic stage from 12/22/2021: No Stage Recommended (ypT3, pN3a, cM0, G2, ER-, PR-, HER2-) - Signed by [name], MD on 12/22/2021 Histopathologic type: Lobular carcinoma, NOS Stage prefix: Post-therapy Method of lymph node assessment: Axillary lymph node dissection Multigene prognostic  tests performed: None Histologic grading system: 3 grade system   08/18/2022 - 10/08/2022 Chemotherapy   Patient is on Treatment Plan : BREAST METASTATIC Sacituzumab govitecan-hziy Drinda Butts) D1,8 q21d     11/04/2022 -  Chemotherapy   Patient is on Treatment Plan : BREAST METASTATIC Fam-Trastuzumab Deruxtecan-nxki (Enhertu) (5.4) q21d        INTERVAL HISTORY:   Rosaisela is a 73 y.o. female presenting to clinic today for follow up of Metastatic triple negative breast cancer to the bones. She was last seen by me on 07/26/23.  Since her last visit, she underwent a right intramedullary nail femoral on 08/20/23 with Dr. Roda Shutters. She was also seen by physical medicine and rehab from 08/23/23 to 08/31/23   Today, she states that she is doing well overall. Her appetite level is at ***%. Her energy level is at ***%.  PAST MEDICAL HISTORY:   Past Medical History: Past Medical History:  Diagnosis Date   Asthma    as child   Cancer (HCC) 12/2020   left breast IMC   Complication of anesthesia    pateitn states,' I coded when I had my D&Cmany years ago.   Dyspnea    Family history of breast cancer    Hypertension    Hypothyroidism    PONV (postoperative nausea and vomiting)    Port-A-Cath in place 02/26/2021    Surgical History: Past Surgical History:  Procedure Laterality Date   ABDOMINAL HYSTERECTOMY     BIOPSY  01/17/2018   Procedure: BIOPSY;  Surgeon: Malissa Hippo, MD;  [facility];  Service: Endoscopy;;  duodenum,gastric   CHOLECYSTECTOMY     COLONOSCOPY WITH PROPOFOL N/A 01/17/2018   Procedure: COLONOSCOPY WITH PROPOFOL;  Surgeon: Malissa Hippo, MD;  [facility];  Service: Endoscopy;  Laterality: N/A;  7:30   DILATION AND CURETTAGE OF UTERUS     ESOPHAGOGASTRODUODENOSCOPY (EGD) WITH PROPOFOL N/A 01/17/2018   Procedure: ESOPHAGOGASTRODUODENOSCOPY (EGD) WITH PROPOFOL;  Surgeon: Malissa Hippo, MD;  [facility];  Service: Endoscopy;  Laterality: N/A;    FEMUR IM NAIL Right 08/20/2023   Procedure: RIGHT INTRAMEDULLARY (IM) NAIL FEMORAL;  Surgeon: Tarry Kos, MD;  Location: [facility];  Service: Orthopedics;  Laterality: Right;   POLYPECTOMY  01/17/2018   Procedure: POLYPECTOMY;  Surgeon: Malissa Hippo, MD;  [facility];  Service: Endoscopy;;  transverse colon, cecal   PORTACATH PLACEMENT Right 02/17/2021   Procedure: INSERTION PORT-A-CATH;  Surgeon: Abigail Miyamoto, MD;  Location: [facility];  Service: General;  Laterality: Right;   RADIOACTIVE SEED GUIDED AXILLARY SENTINEL LYMPH NODE Left 11/20/2021   Procedure: RADIOACTIVE SEED GUIDED LEFT AXILLARY SENTINEL LYMPH NODE DISSECTION;  Surgeon: Abigail Miyamoto, MD;  Location: [facility];  Service: General;  Laterality: Left;   TOTAL MASTECTOMY Left 11/20/2021   Procedure: LEFT TOTAL MASTECTOMY;  Surgeon: Abigail Miyamoto, MD;  Location:  SURGERY CENTER;  Service: General;  Laterality: Left;    Social History: Social History  Socioeconomic History   Marital status: Widowed    Spouse name: Not on file   Number of children: 3   Years of education: Not on file   Highest education level: Not on file  Occupational History   Occupation: [employer]   Occupation: retired  Tobacco Use   Smoking status: Never   Smokeless tobacco: Never  Vaping Use   Vaping status: Never Used  Substance and Sexual Activity   Alcohol use: Never   Drug use: Never   Sexual activity: Not Currently    Birth control/protection: Surgical  Other Topics Concern   Not on file  Social History Narrative   Not on file   Social Drivers of Health   Financial Resource Strain: Low Risk  (07/10/2022)   Overall Financial Resource Strain (CARDIA)    Difficulty of Paying Living Expenses: Not hard at all  Food Insecurity: No Food Insecurity (08/20/2023)   Hunger Vital Sign    Worried About Running Out of Food in the Last Year: Never true    Ran Out of  Food in the Last Year: Never true  Transportation Needs: No Transportation Needs (08/20/2023)   PRAPARE - Administrator, Civil Service (Medical): No    Lack of Transportation (Non-Medical): No  Physical Activity: Insufficiently Active (01/22/2021)   Exercise Vital Sign    Days of Exercise per Week: 3 days    Minutes of Exercise per Session: 30 min  Stress: No Stress Concern Present (01/22/2021)   [employer] of Occupational Health - Occupational Stress Questionnaire    Feeling of Stress : Not at all  Social Connections: Moderately Integrated (01/22/2021)   Social Connection and Isolation Panel [NHANES]    Frequency of Communication with Friends and Family: More than three times a week    Frequency of Social Gatherings with Friends and Family: More than three times a week    Attends Religious Services: More than 4 times per year    Active Member of [employer] or Organizations: No    Attends Engineer, structural: More than 4 times per year    Marital Status: Widowed  Intimate Partner Violence: Not At Risk (08/20/2023)   Humiliation, Afraid, Rape, and Kick questionnaire    Fear of Current or Ex-Partner: No    Emotionally Abused: No    Physically Abused: No    Sexually Abused: No    Family History: Family History  Problem Relation Age of Onset   Breast cancer Mother        dx in her 2s   Heart disease Mother    Thyroid disease Mother    Lung cancer Father        dx in his 69s   Thyroid disease Maternal Aunt    Thyroid nodules Maternal Grandmother 35       goiter   Thyroid disease Maternal Grandmother    Heart disease Maternal Grandfather    Heart disease Paternal Grandmother    Heart disease Paternal Grandfather    Thyroid disease Daughter    Thyroid disease Daughter    Cancer Maternal Uncle        NOS   Cancer Paternal Uncle        NOS   Breast cancer Cousin        pat first cousin died in her 13s;     Current Medications:  Current Outpatient  Medications:    acetaminophen (TYLENOL) 325 MG tablet, Take 1-2 tablets (325-650 mg total)  by mouth every 6 (six) hours as needed for mild pain (pain score 1-3) (or temp > 100.5)., Disp: , Rfl:    apixaban (ELIQUIS) 5 MG TABS tablet, Take 2 tablets (10 mg total) by mouth 2 (two) times daily for 1 day, THEN 1 tablet (5 mg total) 2 (two) times daily., Disp: 60 tablet, Rfl: 0   B Complex-C (B-COMPLEX WITH VITAMIN C) tablet, Take 1 tablet by mouth daily., Disp: 30 tablet, Rfl: 0   cetirizine (ZYRTEC) 10 MG tablet, Take 10 mg by mouth daily as needed for allergies., Disp: , Rfl:    vitamin D3 (CHOLECALCIFEROL) 25 MCG tablet, Take 1 tablet (1,000 Units total) by mouth daily., Disp: 30 tablet, Rfl: 0   dexamethasone (DECADRON) 4 MG tablet, Take 1 tablet (4 mg total) by mouth 2 (two) times daily with a meal., Disp: 60 tablet, Rfl: 0   diclofenac Sodium (VOLTAREN) 1 % GEL, Apply 4 g topically 4 (four) times daily., Disp: 50 g, Rfl: 0   docusate sodium (COLACE) 100 MG capsule, Take 1 capsule (100 mg total) by mouth 2 (two) times daily., Disp: , Rfl:    levothyroxine (SYNTHROID) 75 MCG tablet, Take 1 tablet (75 mcg total) by mouth daily before breakfast., Disp: 30 tablet, Rfl: 0   methocarbamol (ROBAXIN) 500 MG tablet, Take 1 tablet (500 mg total) by mouth every 6 (six) hours as needed for muscle spasms., Disp: 60 tablet, Rfl: 0   oxyCODONE (OXY IR/ROXICODONE) 5 MG immediate release tablet, Take 1-2 tablets (5-10 mg total) by mouth every 4 (four) hours as needed for moderate pain (pain score 4-6)., Disp: 30 tablet, Rfl: 0   pantoprazole (PROTONIX) 40 MG tablet, Take 1 tablet (40 mg total) by mouth 2 (two) times daily before a meal., Disp: 60 tablet, Rfl: 0   sertraline (ZOLOFT) 25 MG tablet, Take 1 tablet (25 mg total) by mouth daily., Disp: 30 tablet, Rfl: 0   Allergies: Allergies  Allergen Reactions   Exforge [Amlodipine Besylate-Valsartan] Nausea And Vomiting   Gadolinium Derivatives Anaphylaxis, Hives  and Itching    Pt should not have MRI contrast in outpt facility. Pt developed hives and itching AFTER taking 13 hr prep. Per Dr. Charise Killian. 04/23/23   Gadopiclenol Hives and Itching    13 hr prep prior to contrast admin    Tape Other (See Comments)    Redness and Irritation   Cheese     Hard cheese   Levofloxacin In D5w Hives   Other    Proanthocyanidin Other (See Comments)   Strawberry Extract Diarrhea    Seeds,nuts, lettuce, grapes   Wild Lettuce Extract (Lactuca Virosa)    Yeast-Derived Drug Products Hives    Mold on bread    REVIEW OF SYSTEMS:   Review of Systems  Constitutional:  Negative for chills, fatigue and fever.  HENT:   Negative for lump/mass, mouth sores, nosebleeds, sore throat and trouble swallowing.   Eyes:  Negative for eye problems.  Respiratory:  Negative for cough and shortness of breath.   Cardiovascular:  Negative for chest pain, leg swelling and palpitations.  Gastrointestinal:  Negative for abdominal pain, constipation, diarrhea, nausea and vomiting.  Genitourinary:  Negative for bladder incontinence, difficulty urinating, dysuria, frequency, hematuria and nocturia.   Musculoskeletal:  Negative for arthralgias, back pain, flank pain, myalgias and neck pain.  Skin:  Negative for itching and rash.  Neurological:  Negative for dizziness, headaches and numbness.  Hematological:  Does not bruise/bleed easily.  Psychiatric/Behavioral:  Negative for  depression, sleep disturbance and suicidal ideas. The patient is not nervous/anxious.   All other systems reviewed and are negative.    VITALS:   There were no vitals taken for this visit.  Wt Readings from Last 3 Encounters:  08/20/23 160 lb (72.6 kg)  08/07/23 160 lb (72.6 kg)  07/26/23 155 lb 12.8 oz (70.7 kg)    There is no height or weight on file to calculate BMI.  Performance status (ECOG): 1 - Symptomatic but completely ambulatory  PHYSICAL EXAM:   Physical Exam Vitals and nursing note reviewed. Exam  conducted with a chaperone present.  Constitutional:      Appearance: Normal appearance.  Cardiovascular:     Rate and Rhythm: Normal rate and regular rhythm.     Pulses: Normal pulses.     Heart sounds: Normal heart sounds.  Pulmonary:     Effort: Pulmonary effort is normal.     Breath sounds: Normal breath sounds.  Abdominal:     Palpations: Abdomen is soft. There is no hepatomegaly, splenomegaly or mass.     Tenderness: There is no abdominal tenderness.  Musculoskeletal:     Right lower leg: No edema.     Left lower leg: No edema.  Lymphadenopathy:     Cervical: No cervical adenopathy.     Right cervical: No superficial, deep or posterior cervical adenopathy.    Left cervical: No superficial, deep or posterior cervical adenopathy.     Upper Body:     Right upper body: No supraclavicular or axillary adenopathy.     Left upper body: No supraclavicular or axillary adenopathy.  Neurological:     General: No focal deficit present.     Mental Status: She is alert and oriented to person, place, and time.  Psychiatric:        Mood and Affect: Mood normal.        Behavior: Behavior normal.     LABS:      Latest Ref Rng & Units 08/30/2023    2:13 AM 08/26/2023    4:32 AM 08/25/2023    5:50 AM  CBC  WBC 4.0 - 10.5 K/uL 5.1  6.7  5.5   Hemoglobin 12.0 - 15.0 g/dL 9.0  8.5  8.8   Hematocrit 36.0 - 46.0 % 27.1  25.8  25.9   Platelets 150 - 400 K/uL 199  155  130       Latest Ref Rng & Units 08/30/2023    2:13 AM 08/27/2023    3:12 PM 08/24/2023    6:09 AM  CMP  Glucose 70 - 99 mg/dL 98  94  67   BUN 8 - 23 mg/dL 17  21  20    Creatinine 0.44 - 1.00 mg/dL 2.72  5.36  6.44   Sodium 135 - 145 mmol/L 131  131  131   Potassium 3.5 - 5.1 mmol/L 4.0  4.2  4.5   Chloride 98 - 111 mmol/L 105  103  104   CO2 22 - 32 mmol/L 22  20  22    Calcium 8.9 - 10.3 mg/dL 7.8  7.7  7.7   Total Protein 6.5 - 8.1 g/dL 5.0   5.0   Total Bilirubin <1.2 mg/dL 0.8   0.5   Alkaline Phos 38 - 126  U/L 62   57   AST 15 - 41 U/L 19   24   ALT 0 - 44 U/L 9   8      No results found  for: "CEA1", "CEA" / No results found for: "CEA1", "CEA" No results found for: "PSA1" No results found for: "VHQ469" No results found for: "CAN125"  No results found for: "TOTALPROTELP", "ALBUMINELP", "A1GS", "A2GS", "BETS", "BETA2SER", "GAMS", "MSPIKE", "SPEI" No results found for: "TIBC", "FERRITIN", "IRONPCTSAT" No results found for: "LDH"   STUDIES:   DG Chest 2 View Result Date: 08/26/2023 CLINICAL DATA:  Short of breath EXAM: CHEST - 2 VIEW COMPARISON:  07/17/2023 FINDINGS: Frontal and lateral views of the chest demonstrates stable right chest wall port. Cardiac silhouette is stable, with continued ectasia of the thoracic aorta. No acute airspace disease, effusion, or pneumothorax. No acute bony abnormality. IMPRESSION: 1. Stable chest, no acute process. Electronically Signed   By: Sharlet Salina M.D.   On: 08/26/2023 18:04   CT ABDOMEN PELVIS WO CONTRAST Result Date: 08/26/2023 CLINICAL DATA:  Abdominal pain, acute, nonlocalized lower GI bleed. EXAM: CT ABDOMEN AND PELVIS WITHOUT CONTRAST TECHNIQUE: Multidetector CT imaging of the abdomen and pelvis was performed following the standard protocol without IV contrast. RADIATION DOSE REDUCTION: This exam was performed according to the departmental dose-optimization program which includes automated exposure control, adjustment of the mA and/or kV according to patient size and/or use of iterative reconstruction technique. COMPARISON:  Abdominopelvic CT with contrast 06/08/2023 FINDINGS: Lower chest: Mild motion artifact on images through the lung bases and upper chest. No change in mild linear atelectasis or scarring at both lung bases. No change in clustered nodularity posteromedially in the left lower lobe. Central venous catheter terminates at the superior cavoatrial junction. There is aortic and coronary atherosclerosis. Hepatobiliary: The liver appears  unremarkable as imaged in the noncontrast state. Status post cholecystectomy without evidence of biliary dilatation. Pancreas: Unremarkable. No pancreatic ductal dilatation or surrounding inflammatory changes. Spleen: Normal in size without focal abnormality. Adrenals/Urinary Tract: Both adrenal glands appear normal. No evidence of urinary tract calculus, suspicious renal lesion or hydronephrosis. The bladder appears unremarkable for its degree of distention. Stomach/Bowel: No enteric contrast administered. The stomach appears unremarkable for its degree of distension. No evidence of bowel wall thickening, distention or surrounding inflammatory change. There is a moderate amount of high density stool throughout the colon. Sigmoid diverticular changes are present. The appendix is not clearly visualized. Vascular/Lymphatic: There are no enlarged abdominal or pelvic lymph nodes. Mild aortoiliac atherosclerosis without evidence of aneurysm. Reproductive: Status post hysterectomy.  No adnexal mass. Other: No evidence of abdominal wall mass or hernia. No ascites or pneumoperitoneum. Musculoskeletal: Grossly stable known osseous metastatic disease within the T8 and T11 vertebral bodies, upper sacrum, both iliac crests and the left ischium. Patient has undergone interval proximal right femoral intramedullary nail and dynamic screw fixation. There is edema, soft tissue emphysema and hematomas within the subcutaneous fat lateral to the right buttocks, the latter measuring up to 4.7 x 2.0 cm on image 65/3. IMPRESSION: 1. No acute findings or explanation for the patient's symptoms. No evidence of gastrointestinal bleeding on noncontrast imaging. High density stool throughout the colon with mild distal colonic diverticulosis. 2. Interval proximal right femoral intramedullary nail and dynamic screw fixation with edema, soft tissue emphysema and hematomas within the subcutaneous fat lateral to the right buttocks. 3. Grossly stable  known osseous metastatic disease. 4. Stable clustered nodularity posteromedially in the left lower lobe, likely infectious/inflammatory. 5.  Aortic Atherosclerosis (ICD10-I70.0). Electronically Signed   By: Carey Bullocks M.D.   On: 08/26/2023 14:17   CT HEAD WO CONTRAST ( ) Result Date: 08/26/2023 CLINICAL DATA:  Altered mental status,  nontraumatic (Ped 0-17y) EXAM: CT HEAD WITHOUT CONTRAST TECHNIQUE: Contiguous axial images were obtained from the base of the skull through the vertex without intravenous contrast. RADIATION DOSE REDUCTION: This exam was performed according to the departmental dose-optimization program which includes automated exposure control, adjustment of the mA and/or kV according to patient size and/or use of iterative reconstruction technique. COMPARISON:  Prior bone scan dated 06/29/2023, brain MR 04/23/2023 FINDINGS: Brain: No hemorrhage. No hydrocephalus. Extra-axial fluid collection. No CT evidence of an acute cortical infarct. No mass effect. No mass lesion. There is a background of mild chronic microvascular ischemic change. Vascular: No hyperdense vessel or unexpected calcification. Skull: There are scattered lucent calvarial lesions, most notable of which is in the right occipital bone (series 4, image 20), worrisome for osseous metastatic disease. Sinuses/Orbits: Large right mastoid effusion and middle ear effusion. No left-sided mastoid or middle ear effusion. Paranasal sinuses are clear. Left lens replacement. Orbits are otherwise unremarkable. Other: None. IMPRESSION: 1. No CT finding to explain altered mental status 2. Scattered lucent calvarial lesions, most notable of which is in the right occipital bone, worrisome for osseous metastatic disease. 3. Large right mastoid and middle ear effusion, unchanged from prior brain MRI. Electronically Signed   By: Lorenza Cambridge M.D.   On: 08/26/2023 10:33   VAS [place] LOWER EXTREMITY VENOUS (DVT) Result Date: 08/25/2023  Lower Venous  DVT Study Patient Name:  AALYAH WERLINE Baptista  Date of Exam:   08/25/2023 Medical Rec #: [id number]          Accession #:    [id number] Date of Birth: 1951-05-22          Patient Gender: F Patient Age:   20 years Exam Location:  [facility] Procedure:      VAS [place] LOWER EXTREMITY VENOUS (DVT) Referring Phys: Mariam Dollar --------------------------------------------------------------------------------  Indications: Swelling.  Risk Factors: Cancer. Comparison Study: No prior studies. Performing Technologist: Chanda Busing RVT  Examination Guidelines: A complete evaluation includes B-mode imaging, spectral Doppler, color Doppler, and power Doppler as needed of all accessible portions of each vessel. Bilateral testing is considered an integral part of a complete examination. Limited examinations for reoccurring indications may be performed as noted. The reflux portion of the exam is performed with the patient in reverse Trendelenburg.  +---------+---------------+---------+-----------+----------+--------------+ RIGHT    CompressibilityPhasicitySpontaneityPropertiesThrombus Aging +---------+---------------+---------+-----------+----------+--------------+ CFV      Partial        Yes      Yes                  Acute          +---------+---------------+---------+-----------+----------+--------------+ SFJ      Full                                                        +---------+---------------+---------+-----------+----------+--------------+ FV Prox  None           No       No                   Acute          +---------+---------------+---------+-----------+----------+--------------+ FV Mid   None           No       No  Acute          +---------+---------------+---------+-----------+----------+--------------+ FV DistalNone           No       No                   Acute          +---------+---------------+---------+-----------+----------+--------------+ PFV       Full                                                        +---------+---------------+---------+-----------+----------+--------------+ POP      None           No       No                   Acute          +---------+---------------+---------+-----------+----------+--------------+ PTV      None                                         Acute          +---------+---------------+---------+-----------+----------+--------------+ PERO     Partial                                      Acute          +---------+---------------+---------+-----------+----------+--------------+ Gastroc  Partial                                      Acute          +---------+---------------+---------+-----------+----------+--------------+   +---------+---------------+---------+-----------+----------+--------------+ LEFT     CompressibilityPhasicitySpontaneityPropertiesThrombus Aging +---------+---------------+---------+-----------+----------+--------------+ CFV      Full           Yes      Yes                                 +---------+---------------+---------+-----------+----------+--------------+ SFJ      Full                                                        +---------+---------------+---------+-----------+----------+--------------+ FV Prox  Full                                                        +---------+---------------+---------+-----------+----------+--------------+ FV Mid   Full                                                        +---------+---------------+---------+-----------+----------+--------------+ FV DistalFull                                                        +---------+---------------+---------+-----------+----------+--------------+  PFV      Full                                                        +---------+---------------+---------+-----------+----------+--------------+ POP      Full           Yes      Yes                                  +---------+---------------+---------+-----------+----------+--------------+ PTV      Full                                                        +---------+---------------+---------+-----------+----------+--------------+ PERO     Full                                                        +---------+---------------+---------+-----------+----------+--------------+     Summary: RIGHT: - Findings consistent with acute deep vein thrombosis involving the right common femoral vein, right femoral vein, right popliteal vein, right posterior tibial veins, and right peroneal veins. Findings consistent with acute intramuscular thrombosis involving the right gastrocnemius veins. - No cystic structure found in the popliteal fossa.  LEFT: - There is no evidence of deep vein thrombosis in the lower extremity.  - No cystic structure found in the popliteal fossa.  *See table(s) above for measurements and observations. Electronically signed by Coral Else MD on 08/25/2023 at 9:09:36 PM.    Final    DG FEMUR, MIN 2 VIEWS RIGHT Result Date: 08/20/2023 CLINICAL DATA:  ProScan pre provided for right femur ORIF. EXAM: OPERATIVE RIGHT HIP AND FEMUR 9 VIEWS TECHNIQUE: Fluoroscopic spot image(s) were submitted for interpretation post-operatively. COMPARISON:  07/05/2023. FINDINGS: Images show placement an intramedullary rod, distal aspect in the distal femoral metaphysis. Rod supports 2 compression screws crossing the neck into the femoral head. Orthopedic hardware is well seated. IMPRESSION: 1. Imaging provided for right femur ORIF. Please refer to the procedure report for further details. Electronically Signed   By: Amie Portland M.D.   On: 08/20/2023 16:22   DG C-Arm 1-60 Min-No Report Result Date: 08/20/2023 Fluoroscopy was utilized by the requesting physician.  No radiographic interpretation.

## 2023-09-15 NOTE — Telephone Encounter (Signed)
Called patient to see if she was coming for her appts. [name] her daughter answered, she is on Hospice care now. Will cancel all further appts.

## 2023-09-15 DEATH — deceased

## 2023-09-20 ENCOUNTER — Inpatient Hospital Stay: Payer: Medicare Other

## 2023-09-20 ENCOUNTER — Ambulatory Visit: Payer: Medicare Other | Admitting: Radiation Oncology

## 2023-09-30 ENCOUNTER — Ambulatory Visit: Payer: Medicare Other | Admitting: Internal Medicine
